# Patient Record
Sex: Female | Born: 1970 | Race: Black or African American | Hispanic: No | Marital: Married | Smoking: Former smoker
Health system: Southern US, Community
[De-identification: ages and names within clinical notes are randomized; demographics above are authoritative.]

## PROBLEM LIST (undated history)

## (undated) DIAGNOSIS — G510 Bell's palsy: Secondary | ICD-10-CM

## (undated) DIAGNOSIS — M792 Neuralgia and neuritis, unspecified: Secondary | ICD-10-CM

## (undated) DIAGNOSIS — F32A Depression, unspecified: Secondary | ICD-10-CM

## (undated) DIAGNOSIS — Z9289 Personal history of other medical treatment: Secondary | ICD-10-CM

## (undated) DIAGNOSIS — R609 Edema, unspecified: Secondary | ICD-10-CM

## (undated) DIAGNOSIS — Z8669 Personal history of other diseases of the nervous system and sense organs: Secondary | ICD-10-CM

## (undated) DIAGNOSIS — I82409 Acute embolism and thrombosis of unspecified deep veins of unspecified lower extremity: Secondary | ICD-10-CM

## (undated) DIAGNOSIS — F419 Anxiety disorder, unspecified: Secondary | ICD-10-CM

## (undated) DIAGNOSIS — D72819 Decreased white blood cell count, unspecified: Secondary | ICD-10-CM

## (undated) DIAGNOSIS — D649 Anemia, unspecified: Secondary | ICD-10-CM

## (undated) DIAGNOSIS — F329 Major depressive disorder, single episode, unspecified: Secondary | ICD-10-CM

## (undated) DIAGNOSIS — Z95828 Presence of other vascular implants and grafts: Principal | ICD-10-CM

## (undated) DIAGNOSIS — K219 Gastro-esophageal reflux disease without esophagitis: Secondary | ICD-10-CM

## (undated) DIAGNOSIS — I4510 Unspecified right bundle-branch block: Secondary | ICD-10-CM

## (undated) DIAGNOSIS — M797 Fibromyalgia: Secondary | ICD-10-CM

## (undated) DIAGNOSIS — G35 Multiple sclerosis: Secondary | ICD-10-CM

## (undated) DIAGNOSIS — R600 Localized edema: Secondary | ICD-10-CM

## (undated) DIAGNOSIS — I10 Essential (primary) hypertension: Secondary | ICD-10-CM

## (undated) HISTORY — DX: Multiple sclerosis: G35

## (undated) HISTORY — DX: Essential (primary) hypertension: I10

## (undated) HISTORY — DX: Edema, unspecified: R60.9

## (undated) HISTORY — DX: Localized edema: R60.0

## (undated) HISTORY — DX: Anemia, unspecified: D64.9

## (undated) HISTORY — PX: PORTACATH PLACEMENT: SHX2246

## (undated) HISTORY — PX: CHOLECYSTECTOMY: SHX55

## (undated) HISTORY — DX: Decreased white blood cell count, unspecified: D72.819

## (undated) HISTORY — DX: Acute embolism and thrombosis of unspecified deep veins of unspecified lower extremity: I82.409

## (undated) HISTORY — DX: Presence of other vascular implants and grafts: Z95.828

## (undated) HISTORY — DX: Major depressive disorder, single episode, unspecified: F32.9

## (undated) HISTORY — DX: Anxiety disorder, unspecified: F41.9

## (undated) HISTORY — DX: Depression, unspecified: F32.A

## (undated) HISTORY — DX: Neuralgia and neuritis, unspecified: M79.2

## (undated) HISTORY — PX: ABDOMINAL HYSTERECTOMY: SHX81

---

## 1995-06-12 DIAGNOSIS — G35 Multiple sclerosis: Secondary | ICD-10-CM

## 1995-06-12 HISTORY — DX: Multiple sclerosis: G35

## 1998-05-20 ENCOUNTER — Ambulatory Visit (HOSPITAL_COMMUNITY): Admission: RE | Admit: 1998-05-20 | Discharge: 1998-05-20 | Payer: Self-pay | Admitting: Psychiatry

## 1998-06-11 DIAGNOSIS — Z9289 Personal history of other medical treatment: Secondary | ICD-10-CM

## 1998-06-11 HISTORY — DX: Personal history of other medical treatment: Z92.89

## 1998-09-29 ENCOUNTER — Other Ambulatory Visit: Admission: RE | Admit: 1998-09-29 | Discharge: 1998-09-29 | Payer: Self-pay | Admitting: Family Medicine

## 1999-06-12 HISTORY — PX: TUBAL LIGATION: SHX77

## 1999-11-22 ENCOUNTER — Ambulatory Visit (HOSPITAL_COMMUNITY): Admission: RE | Admit: 1999-11-22 | Discharge: 1999-11-22 | Payer: Self-pay | Admitting: Psychiatry

## 2000-01-02 ENCOUNTER — Emergency Department (HOSPITAL_COMMUNITY): Admission: EM | Admit: 2000-01-02 | Discharge: 2000-01-02 | Payer: Self-pay | Admitting: *Deleted

## 2000-01-02 ENCOUNTER — Encounter: Payer: Self-pay | Admitting: *Deleted

## 2000-01-03 ENCOUNTER — Emergency Department (HOSPITAL_COMMUNITY): Admission: EM | Admit: 2000-01-03 | Discharge: 2000-01-03 | Payer: Self-pay | Admitting: *Deleted

## 2000-06-18 ENCOUNTER — Ambulatory Visit (HOSPITAL_COMMUNITY): Admission: RE | Admit: 2000-06-18 | Discharge: 2000-06-18 | Payer: Self-pay | Admitting: Psychiatry

## 2000-06-18 ENCOUNTER — Encounter: Payer: Self-pay | Admitting: Psychiatry

## 2000-10-05 ENCOUNTER — Emergency Department (HOSPITAL_COMMUNITY): Admission: EM | Admit: 2000-10-05 | Discharge: 2000-10-05 | Payer: Self-pay | Admitting: Emergency Medicine

## 2000-11-23 ENCOUNTER — Emergency Department (HOSPITAL_COMMUNITY): Admission: EM | Admit: 2000-11-23 | Discharge: 2000-11-23 | Payer: Self-pay | Admitting: *Deleted

## 2000-11-27 ENCOUNTER — Ambulatory Visit (HOSPITAL_COMMUNITY): Admission: RE | Admit: 2000-11-27 | Discharge: 2000-11-27 | Payer: Self-pay | Admitting: Internal Medicine

## 2000-11-27 ENCOUNTER — Encounter: Payer: Self-pay | Admitting: Internal Medicine

## 2002-07-20 ENCOUNTER — Encounter (HOSPITAL_COMMUNITY): Admission: RE | Admit: 2002-07-20 | Discharge: 2002-10-18 | Payer: Self-pay | Admitting: Neurology

## 2002-09-16 ENCOUNTER — Encounter (HOSPITAL_COMMUNITY): Admission: RE | Admit: 2002-09-16 | Discharge: 2002-09-19 | Payer: Self-pay | Admitting: Neurology

## 2002-12-12 ENCOUNTER — Emergency Department (HOSPITAL_COMMUNITY): Admission: EM | Admit: 2002-12-12 | Discharge: 2002-12-12 | Payer: Self-pay | Admitting: Emergency Medicine

## 2002-12-12 ENCOUNTER — Encounter: Payer: Self-pay | Admitting: Emergency Medicine

## 2002-12-13 ENCOUNTER — Emergency Department (HOSPITAL_COMMUNITY): Admission: EM | Admit: 2002-12-13 | Discharge: 2002-12-13 | Payer: Self-pay | Admitting: Internal Medicine

## 2002-12-18 ENCOUNTER — Encounter (HOSPITAL_COMMUNITY): Admission: RE | Admit: 2002-12-18 | Discharge: 2003-01-17 | Payer: Self-pay | Admitting: Internal Medicine

## 2003-01-26 ENCOUNTER — Ambulatory Visit (HOSPITAL_COMMUNITY): Admission: RE | Admit: 2003-01-26 | Discharge: 2003-01-26 | Payer: Self-pay | Admitting: Family Medicine

## 2003-01-26 ENCOUNTER — Encounter: Payer: Self-pay | Admitting: Family Medicine

## 2003-08-12 ENCOUNTER — Other Ambulatory Visit: Admission: RE | Admit: 2003-08-12 | Discharge: 2003-08-12 | Payer: Self-pay | Admitting: *Deleted

## 2003-08-20 ENCOUNTER — Inpatient Hospital Stay (HOSPITAL_COMMUNITY): Admission: AD | Admit: 2003-08-20 | Discharge: 2003-08-20 | Payer: Self-pay | Admitting: *Deleted

## 2003-09-09 ENCOUNTER — Ambulatory Visit (HOSPITAL_COMMUNITY): Admission: RE | Admit: 2003-09-09 | Discharge: 2003-09-09 | Payer: Self-pay | Admitting: *Deleted

## 2003-09-09 ENCOUNTER — Encounter (INDEPENDENT_AMBULATORY_CARE_PROVIDER_SITE_OTHER): Payer: Self-pay | Admitting: *Deleted

## 2003-11-24 ENCOUNTER — Observation Stay (HOSPITAL_COMMUNITY): Admission: RE | Admit: 2003-11-24 | Discharge: 2003-11-25 | Payer: Self-pay | Admitting: *Deleted

## 2003-11-24 ENCOUNTER — Encounter (INDEPENDENT_AMBULATORY_CARE_PROVIDER_SITE_OTHER): Payer: Self-pay | Admitting: Specialist

## 2004-02-05 ENCOUNTER — Encounter (HOSPITAL_COMMUNITY): Admission: RE | Admit: 2004-02-05 | Discharge: 2004-03-06 | Payer: Self-pay | Admitting: Family Medicine

## 2004-03-27 ENCOUNTER — Encounter (HOSPITAL_COMMUNITY): Admission: RE | Admit: 2004-03-27 | Discharge: 2004-04-26 | Payer: Self-pay | Admitting: Family Medicine

## 2004-04-22 ENCOUNTER — Ambulatory Visit (HOSPITAL_COMMUNITY): Payer: Self-pay | Admitting: Family Medicine

## 2004-05-22 ENCOUNTER — Ambulatory Visit: Payer: Self-pay | Admitting: Family Medicine

## 2004-06-22 ENCOUNTER — Ambulatory Visit (HOSPITAL_COMMUNITY): Payer: Self-pay | Admitting: Family Medicine

## 2004-06-22 ENCOUNTER — Encounter (HOSPITAL_COMMUNITY): Admission: RE | Admit: 2004-06-22 | Discharge: 2004-07-22 | Payer: Self-pay | Admitting: Family Medicine

## 2004-08-04 ENCOUNTER — Encounter (HOSPITAL_COMMUNITY): Admission: RE | Admit: 2004-08-04 | Discharge: 2004-09-03 | Payer: Self-pay | Admitting: Family Medicine

## 2004-08-07 ENCOUNTER — Ambulatory Visit: Payer: Self-pay | Admitting: Family Medicine

## 2004-09-19 ENCOUNTER — Encounter (HOSPITAL_COMMUNITY): Admission: RE | Admit: 2004-09-19 | Discharge: 2004-10-19 | Payer: Self-pay | Admitting: Family Medicine

## 2004-09-19 ENCOUNTER — Ambulatory Visit (HOSPITAL_COMMUNITY): Payer: Self-pay | Admitting: Family Medicine

## 2004-09-19 ENCOUNTER — Ambulatory Visit: Payer: Self-pay | Admitting: Family Medicine

## 2004-10-03 ENCOUNTER — Inpatient Hospital Stay (HOSPITAL_COMMUNITY): Admission: AD | Admit: 2004-10-03 | Discharge: 2004-10-04 | Payer: Self-pay | Admitting: Internal Medicine

## 2004-10-03 ENCOUNTER — Ambulatory Visit: Payer: Self-pay | Admitting: Family Medicine

## 2004-10-11 ENCOUNTER — Ambulatory Visit: Payer: Self-pay | Admitting: Family Medicine

## 2004-11-22 ENCOUNTER — Ambulatory Visit (HOSPITAL_COMMUNITY): Payer: Self-pay | Admitting: Family Medicine

## 2004-11-22 ENCOUNTER — Encounter (HOSPITAL_COMMUNITY): Admission: RE | Admit: 2004-11-22 | Discharge: 2004-12-22 | Payer: Self-pay | Admitting: Family Medicine

## 2005-01-12 ENCOUNTER — Encounter (HOSPITAL_COMMUNITY): Admission: RE | Admit: 2005-01-12 | Discharge: 2005-02-08 | Payer: Self-pay | Admitting: Family Medicine

## 2005-01-13 ENCOUNTER — Ambulatory Visit (HOSPITAL_COMMUNITY): Payer: Self-pay | Admitting: Family Medicine

## 2005-03-01 ENCOUNTER — Encounter (HOSPITAL_COMMUNITY): Admission: RE | Admit: 2005-03-01 | Discharge: 2005-03-10 | Payer: Self-pay | Admitting: Family Medicine

## 2005-03-01 ENCOUNTER — Ambulatory Visit (HOSPITAL_COMMUNITY): Payer: Self-pay | Admitting: Family Medicine

## 2005-04-12 ENCOUNTER — Encounter (HOSPITAL_COMMUNITY): Admission: RE | Admit: 2005-04-12 | Discharge: 2005-05-12 | Payer: Self-pay | Admitting: Oncology

## 2005-04-17 ENCOUNTER — Ambulatory Visit: Payer: Self-pay | Admitting: Family Medicine

## 2005-05-14 ENCOUNTER — Encounter (HOSPITAL_COMMUNITY): Admission: RE | Admit: 2005-05-14 | Discharge: 2005-05-14 | Payer: Self-pay | Admitting: Oncology

## 2005-05-14 ENCOUNTER — Ambulatory Visit (HOSPITAL_COMMUNITY): Payer: Self-pay | Admitting: Family Medicine

## 2005-06-29 ENCOUNTER — Encounter (HOSPITAL_COMMUNITY): Admission: RE | Admit: 2005-06-29 | Discharge: 2005-07-29 | Payer: Self-pay | Admitting: Family Medicine

## 2005-07-04 ENCOUNTER — Ambulatory Visit (HOSPITAL_COMMUNITY): Payer: Self-pay | Admitting: Family Medicine

## 2005-07-31 ENCOUNTER — Encounter (HOSPITAL_COMMUNITY): Admission: RE | Admit: 2005-07-31 | Discharge: 2005-08-30 | Payer: Self-pay | Admitting: Oncology

## 2005-10-04 ENCOUNTER — Encounter (HOSPITAL_COMMUNITY): Admission: RE | Admit: 2005-10-04 | Discharge: 2005-11-03 | Payer: Self-pay | Admitting: Family Medicine

## 2005-10-04 ENCOUNTER — Ambulatory Visit (HOSPITAL_COMMUNITY): Payer: Self-pay | Admitting: Family Medicine

## 2005-11-14 ENCOUNTER — Encounter (HOSPITAL_COMMUNITY): Admission: RE | Admit: 2005-11-14 | Discharge: 2005-12-14 | Payer: Self-pay | Admitting: Family Medicine

## 2005-12-17 ENCOUNTER — Ambulatory Visit: Payer: Self-pay | Admitting: Family Medicine

## 2006-01-11 ENCOUNTER — Encounter (HOSPITAL_COMMUNITY): Admission: RE | Admit: 2006-01-11 | Discharge: 2006-02-10 | Payer: Self-pay | Admitting: Oncology

## 2006-01-11 ENCOUNTER — Ambulatory Visit (HOSPITAL_COMMUNITY): Payer: Self-pay | Admitting: Family Medicine

## 2006-02-22 ENCOUNTER — Encounter (HOSPITAL_COMMUNITY): Admission: RE | Admit: 2006-02-22 | Discharge: 2006-03-08 | Payer: Self-pay | Admitting: Family Medicine

## 2006-04-08 ENCOUNTER — Encounter (HOSPITAL_COMMUNITY): Admission: RE | Admit: 2006-04-08 | Discharge: 2006-05-08 | Payer: Self-pay | Admitting: Family Medicine

## 2006-04-08 ENCOUNTER — Ambulatory Visit (HOSPITAL_COMMUNITY): Payer: Self-pay | Admitting: Family Medicine

## 2006-07-11 ENCOUNTER — Encounter (HOSPITAL_COMMUNITY): Admission: RE | Admit: 2006-07-11 | Discharge: 2006-08-10 | Payer: Self-pay | Admitting: Family Medicine

## 2006-07-11 ENCOUNTER — Ambulatory Visit (HOSPITAL_COMMUNITY): Payer: Self-pay | Admitting: Family Medicine

## 2006-08-22 ENCOUNTER — Encounter (HOSPITAL_COMMUNITY): Admission: RE | Admit: 2006-08-22 | Discharge: 2006-09-21 | Payer: Self-pay | Admitting: Family Medicine

## 2006-10-03 ENCOUNTER — Encounter (HOSPITAL_COMMUNITY): Admission: RE | Admit: 2006-10-03 | Discharge: 2006-11-02 | Payer: Self-pay | Admitting: Family Medicine

## 2006-10-03 ENCOUNTER — Ambulatory Visit (HOSPITAL_COMMUNITY): Payer: Self-pay | Admitting: Family Medicine

## 2006-12-09 ENCOUNTER — Ambulatory Visit (HOSPITAL_COMMUNITY): Payer: Self-pay | Admitting: Family Medicine

## 2006-12-09 ENCOUNTER — Encounter (HOSPITAL_COMMUNITY): Admission: RE | Admit: 2006-12-09 | Discharge: 2007-01-08 | Payer: Self-pay | Admitting: Oncology

## 2007-02-18 ENCOUNTER — Ambulatory Visit (HOSPITAL_COMMUNITY): Payer: Self-pay | Admitting: Oncology

## 2007-02-18 ENCOUNTER — Encounter (HOSPITAL_COMMUNITY): Admission: RE | Admit: 2007-02-18 | Discharge: 2007-03-11 | Payer: Self-pay | Admitting: Family Medicine

## 2007-04-02 ENCOUNTER — Ambulatory Visit (HOSPITAL_COMMUNITY): Payer: Self-pay | Admitting: Family Medicine

## 2007-04-02 ENCOUNTER — Encounter (HOSPITAL_COMMUNITY): Admission: RE | Admit: 2007-04-02 | Discharge: 2007-05-02 | Payer: Self-pay | Admitting: Family Medicine

## 2007-05-14 ENCOUNTER — Ambulatory Visit (HOSPITAL_COMMUNITY): Payer: Self-pay | Admitting: Oncology

## 2007-05-14 ENCOUNTER — Encounter (HOSPITAL_COMMUNITY): Admission: RE | Admit: 2007-05-14 | Discharge: 2007-06-11 | Payer: Self-pay | Admitting: Pediatrics

## 2007-05-21 ENCOUNTER — Ambulatory Visit (HOSPITAL_COMMUNITY): Admission: RE | Admit: 2007-05-21 | Discharge: 2007-05-21 | Payer: Self-pay | Admitting: Family Medicine

## 2007-06-12 ENCOUNTER — Encounter: Payer: Self-pay | Admitting: Family Medicine

## 2007-06-15 ENCOUNTER — Emergency Department (HOSPITAL_COMMUNITY): Admission: EM | Admit: 2007-06-15 | Discharge: 2007-06-15 | Payer: Self-pay | Admitting: Emergency Medicine

## 2007-06-16 ENCOUNTER — Ambulatory Visit (HOSPITAL_COMMUNITY): Admission: RE | Admit: 2007-06-16 | Discharge: 2007-06-16 | Payer: Self-pay | Admitting: Family Medicine

## 2007-06-19 ENCOUNTER — Encounter (HOSPITAL_COMMUNITY): Admission: RE | Admit: 2007-06-19 | Discharge: 2007-07-19 | Payer: Self-pay | Admitting: Family Medicine

## 2007-06-25 ENCOUNTER — Observation Stay (HOSPITAL_COMMUNITY): Admission: RE | Admit: 2007-06-25 | Discharge: 2007-06-26 | Payer: Self-pay | Admitting: General Surgery

## 2007-06-25 ENCOUNTER — Encounter (INDEPENDENT_AMBULATORY_CARE_PROVIDER_SITE_OTHER): Payer: Self-pay | Admitting: General Surgery

## 2007-07-24 ENCOUNTER — Encounter (HOSPITAL_COMMUNITY): Admission: RE | Admit: 2007-07-24 | Discharge: 2007-08-23 | Payer: Self-pay | Admitting: Family Medicine

## 2007-07-24 ENCOUNTER — Ambulatory Visit (HOSPITAL_COMMUNITY): Payer: Self-pay | Admitting: Family Medicine

## 2007-09-09 ENCOUNTER — Encounter (HOSPITAL_COMMUNITY): Admission: RE | Admit: 2007-09-09 | Discharge: 2007-10-09 | Payer: Self-pay | Admitting: Oncology

## 2007-10-21 ENCOUNTER — Encounter (HOSPITAL_COMMUNITY): Admission: RE | Admit: 2007-10-21 | Discharge: 2007-11-20 | Payer: Self-pay | Admitting: Family Medicine

## 2007-10-21 ENCOUNTER — Ambulatory Visit (HOSPITAL_COMMUNITY): Payer: Self-pay | Admitting: Family Medicine

## 2007-12-05 ENCOUNTER — Encounter (HOSPITAL_COMMUNITY): Admission: RE | Admit: 2007-12-05 | Discharge: 2008-01-04 | Payer: Self-pay | Admitting: Family Medicine

## 2008-01-22 ENCOUNTER — Ambulatory Visit (HOSPITAL_COMMUNITY): Payer: Self-pay | Admitting: Family Medicine

## 2008-01-22 ENCOUNTER — Encounter (HOSPITAL_COMMUNITY): Admission: RE | Admit: 2008-01-22 | Discharge: 2008-02-21 | Payer: Self-pay | Admitting: Family Medicine

## 2008-03-11 ENCOUNTER — Ambulatory Visit (HOSPITAL_COMMUNITY): Payer: Self-pay | Admitting: Family Medicine

## 2008-03-11 ENCOUNTER — Encounter (HOSPITAL_COMMUNITY): Admission: RE | Admit: 2008-03-11 | Discharge: 2008-04-10 | Payer: Self-pay | Admitting: Family Medicine

## 2008-05-26 ENCOUNTER — Encounter (HOSPITAL_COMMUNITY): Admission: RE | Admit: 2008-05-26 | Discharge: 2008-06-25 | Payer: Self-pay | Admitting: Family Medicine

## 2008-05-26 ENCOUNTER — Ambulatory Visit (HOSPITAL_COMMUNITY): Payer: Self-pay | Admitting: Family Medicine

## 2009-06-11 HISTORY — PX: ESOPHAGOGASTRODUODENOSCOPY: SHX1529

## 2010-04-03 ENCOUNTER — Emergency Department (HOSPITAL_COMMUNITY): Admission: EM | Admit: 2010-04-03 | Discharge: 2010-04-03 | Payer: Self-pay | Admitting: Emergency Medicine

## 2010-04-12 ENCOUNTER — Ambulatory Visit: Payer: Self-pay | Admitting: Internal Medicine

## 2010-04-12 ENCOUNTER — Encounter: Payer: Self-pay | Admitting: Gastroenterology

## 2010-04-12 DIAGNOSIS — D649 Anemia, unspecified: Secondary | ICD-10-CM | POA: Insufficient documentation

## 2010-04-12 DIAGNOSIS — R112 Nausea with vomiting, unspecified: Secondary | ICD-10-CM | POA: Insufficient documentation

## 2010-04-12 DIAGNOSIS — R1319 Other dysphagia: Secondary | ICD-10-CM

## 2010-04-17 ENCOUNTER — Ambulatory Visit (HOSPITAL_COMMUNITY): Admission: RE | Admit: 2010-04-17 | Discharge: 2010-04-17 | Payer: Self-pay | Admitting: Gastroenterology

## 2010-04-19 ENCOUNTER — Encounter (INDEPENDENT_AMBULATORY_CARE_PROVIDER_SITE_OTHER): Payer: Self-pay

## 2010-04-23 ENCOUNTER — Encounter: Payer: Self-pay | Admitting: Internal Medicine

## 2010-06-01 ENCOUNTER — Encounter: Payer: Self-pay | Admitting: Internal Medicine

## 2010-07-12 ENCOUNTER — Encounter (INDEPENDENT_AMBULATORY_CARE_PROVIDER_SITE_OTHER): Payer: Self-pay | Admitting: *Deleted

## 2010-07-13 NOTE — Assessment & Plan Note (Signed)
Summary: ER REFERRAL NAUSEA AN VOMITING/ss   Visit Type:  Initial Consult Referring Provider:  ER Primary Care Provider:  Lilyan Punt  Chief Complaint:  n/v.  History of Present Illness: [name] is a very pleasant 40 y/o AA female, who was referred by [facility] for further evaluation of N/V.   Recent acute onset N/V last Friday. N/V all weekend. Saw Dr. Lilyan Punt on Monday but dehydrated so sent to ED. She has also had chronic component of nausea actually for about several months. Certain foods cause vomiting. Last time was one month ago. No vomiting since Monday. Heartburn symptoms as well. Regurgitation. Some dysphagia, breads and meats. Rare abd pain. Some gas pain, goes away with gas-x. BM better with increase water consumption. No melena. Occasion brbpr on toilet tissue with hard stools. BM once every two days.   AAS recently showed mildly prominent air in stomach/colon. Labs in ED 04/03/10: WBC 3600, H/H 11.3/34, MCV 84.3, Plt 302, lipase 26, cre 0.75. U/A showed many bacteria, wbc 3-6, small LE, 30 protein, 15 ketone.   Current Medications (verified): 1)  Sucralfate 1 Gm Tabs (Sucralfate) .... Take 1 Tablet By Mouth Four Times A Day 2)  Gilenya 0.5 Mg Caps (Fingolimod Hcl) .... Take 1 Tablet By Mouth Once A Day 3)  Ritalin 10 Mg Tabs (Methylphenidate Hcl) .... Take 1 Tablet By Mouth Two Times A Day 4)  Verelan 120 Mg Xr24h-Cap (Verapamil Hcl) .... One Tablet in The Am and Two Tablets in The Pm 5)  Protonix 40 Mg Tbec (Pantoprazole Sodium) .... Take 1 Tablet By Mouth Once A Day 6)  Zofran 8 Mg Tabs (Ondansetron Hcl) .... As Needed 7)  Effexor Xr 150 Mg Xr24h-Cap (Venlafaxine Hcl) .... Take 1 Tablet By Mouth Once A Day 8)  Multi-Vitamin .... Take 1 Tablet By Mouth Once A Day 9)  Aleve .... As Needed 10)  Advil .... As Needed  Allergies (verified): No Known Drug Allergies  Past History:  Past Medical History: Multiple Sclerosis, Diagnosis 1997 Migraine H/As No prior  TCS/EGD  Past Surgical History: Cholecystectomy Hysterectomy Tubal Ligation Porta Cath, given for MS treatment reasons  Family History: Paternal uncle, ?colon cancer, greater than age 82 Brother, alcohol cirrhosis.  Social History: Married. Two girls, teenagers. Disabled in 2001 related to MS. Nonsmoker, alcohol, drugs.   Review of Systems General:  Denies fever, chills, sweats, anorexia, fatigue, weakness, and weight loss. Eyes:  Denies vision loss. ENT:  Complains of difficulty swallowing; denies nasal congestion, sore throat, and hoarseness. CV:  Denies chest pains, angina, palpitations, dyspnea on exertion, and peripheral edema. Resp:  Denies dyspnea at rest, dyspnea with exercise, cough, sputum, and wheezing. GI:  See HPI. GU:  Denies urinary burning and blood in urine. MS:  Denies joint pain / LOM. Derm:  Denies rash and itching. Neuro:  Complains of frequent headaches; denies weakness, memory loss, and confusion. Psych:  Denies depression and anxiety. Endo:  Denies unusual weight change. Heme:  Denies bruising and bleeding. Allergy:  Denies hives and rash.  Vital Signs:  Patient profile:   40 year old female Height:      70 inches Weight:      291 pounds BMI:     41.91 Temp:     98.5 degrees F oral Pulse rate:   80 / minute BP sitting:   110 / 70  (left arm) Cuff size:   large  Vitals Entered By: Cloria Spring LPN (April 12, 2010 1:39 PM)  Physical Exam  General:  Well developed, well nourished, no acute distress.obese.   Head:  Normocephalic and atraumatic. Eyes:  sclera nonicteric Mouth:  Oropharyngeal mucosa moist, pink.  No lesions, erythema or exudate.    Neck:  Supple; no masses or thyromegaly. Lungs:  Clear throughout to auscultation. Heart:  Regular rate and rhythm; no murmurs, rubs,  or bruits. Abdomen:  Obese. Soft. NT. ND. No HSM or masses. No abd bruit or hernia.  Extremities:  No clubbing, cyanosis, edema or deformities noted. Neurologic:   Alert and  oriented x4;  grossly normal neurologically. Skin:  Intact without significant lesions or rashes. Cervical Nodes:  No significant cervical adenopathy. Psych:  Alert and cooperative. Normal mood and affect.  Impression & Recommendations:  Problem # 1:  NAUSEA WITH VOMITING (ICD-787.01) N/V acute on chronic requiring recent ED visit for dehydration. Usually brought on by fatty or greasy meal. GB removed few years ago. Also with c/o dysphagia to solid foods. Heartburn/regurgitation as well. Protonix and carafate recently started and feels like some improvment. EGD/ED to be performed in near future.  Risks, alternatives, benefits including but not limited to risk of reaction to medications, bleeding, infection, and perforation addressed.  Patient voiced understanding and verbal consent obtained.   Problem # 2:  ANEMIA (ICD-285.9) Mild leukopenia/anemia. Possibly secondary to Gilenya. Rare brbpr on toilet tissue with hard stool. Will check iron studies and ifobt.  I would like to thank [facility] ED/Dr. Annye Rusk for allowing [place] to take part in the care of this nice patient.   Appended Document: ER REFERRAL NAUSEA AN VOMITING/ss Please ask patient to have labs and ifobt done. She had mild anemia which could be secondary to Gilenya but we need to r/o IDA with occult gi bleeding.   Appended Document: Orders Update    Clinical Lists Changes  Orders: Added new Test order of T-Iron [phone]) - Signed Added new Test order of T-Iron Binding Capacity (TIBC) ([id number]) - Signed Added new Test order of T-Ferritin ([id number]) - Signed Added new Test order of T-Hemoglobin and Hematocrit (1005) - Signed      Appended Document: ER REFERRAL NAUSEA AN VOMITING/ss Pt informed. She will come by here in the AM for the iFOBT container and lab order.  Appended Document: Orders Update    Clinical Lists Changes  Orders: Added new Service order of Consultation Level III [phone]) -  Signed      Appended Document: ER REFERRAL NAUSEA AN VOMITING/ss Please f/u with patient about labs and ifobt.  Appended Document: ER REFERRAL NAUSEA AN VOMITING/ss mailed letter to pt

## 2010-07-13 NOTE — Letter (Signed)
Summary: PT REMINDER TO COMPLETE IFOBT  PT REMINDER TO COMPLETE IFOBT   Imported By: Rexene Alberts 06/01/2010 15:19:13  _____________________________________________________________________  External Attachment:    Type:   Image     Comment:   External Document

## 2010-07-13 NOTE — Letter (Signed)
Summary: EGD/ED ORDER  EGD/ED ORDER   Imported By: Ave Filter 04/12/2010 15:05:07  _____________________________________________________________________  External Attachment:    Type:   Image     Comment:   External Document

## 2010-07-13 NOTE — Letter (Signed)
Summary: [facility]  [address]   [place], [place] [address]   Phone: [phone]  Fax: [phone]     April 19, 2010   KAIDA GAMES [address] [place], [place]  [address] 11/22/1970  Dear Ms. Garramone,  During your last appointment, your doctor requested you have some blood work and a stool sample.  Our records indicate you have not had this done.  Remember it is very important to follow your doctor's instructions.   Please have this blood work done as soon as possible and bring the stool sample container back to the office.  It is important that patients and their doctor work together in the management/treatment of their health care.  If you have lost or misplaced your lab orders, please give [place] a call and we will gladly give you a new copy.  If you have already had your blood work drawn, please disregard this letter.  Thank you,    Hendricks Limes LPN  [facility] Ph: [phone]   Fax: [phone]

## 2010-07-13 NOTE — Letter (Signed)
Summary: rpc chart  rpc chart   Imported By: Curtis Sites 01/03/2010 12:12:43  _____________________________________________________________________  External Attachment:    Type:   Image     Comment:   External Document

## 2010-07-13 NOTE — Letter (Signed)
Summary: Patient Notice, Endo Biopsy Results  [facility]  [address]   [place], [place] [address]   Phone: [phone]  Fax: [phone]       April 23, 2010   Melinda Moore [address] [place], [place]  [address] [birth year]    Dear Ms. Douthit,  I am pleased to inform you that the biopsies taken during your recent endoscopic examination did not show any evidence of cancer upon pathologic examination.  There was only mild inflammation.  Additional information/recommendations:  Please call [phone] to schedule a return visit in 3 months to review your condition.  Continue with the treatment plan as outlined on the day of your exam.   Please call us if you are having persistent problems or have questions about your condition that have not been fully answered at this time.  Sincerely,    R. Roetta Sessions MD, FACP [facility] Ph: [phone]   Fax: [phone]   Appended Document: Patient Notice, Endo Biopsy Results letter mailed to pt  Appended Document: Patient Notice, Endo Biopsy Results reminder in computer

## 2010-07-19 NOTE — Letter (Signed)
Summary: Recall Office Visit  [facility]  [address]   [place], [place] [address]   Phone: [phone]  Fax: [phone]      July 12, 2010   Melinda Moore [address] [place], [place]  [address] 22-Dec-1970   Dear [name],   According to our records, it is time for you to schedule a follow-up office visit with [place].   At your convenience, please call [phone] to schedule an office visit. If you have any questions, concerns, or feel that this letter is in error, we would appreciate your call.   Sincerely,    Diana Eves  [facility] Ph: [phone]   Fax: [phone]

## 2010-08-23 LAB — CBC
HCT: 34 % — ABNORMAL LOW (ref 36.0–46.0)
Hemoglobin: 11.3 g/dL — ABNORMAL LOW (ref 12.0–15.0)
MCH: 28.1 pg (ref 26.0–34.0)
Platelets: 302 10*3/uL (ref 150–400)
RDW: 15.6 % — ABNORMAL HIGH (ref 11.5–15.5)
WBC: 3.6 10*3/uL — ABNORMAL LOW (ref 4.0–10.5)

## 2010-08-23 LAB — URINE MICROSCOPIC-ADD ON

## 2010-08-23 LAB — DIFFERENTIAL
Basophils Relative: 0 % (ref 0–1)
Eosinophils Absolute: 0 10*3/uL (ref 0.0–0.7)
Lymphocytes Relative: 5 % — ABNORMAL LOW (ref 12–46)
Neutro Abs: 3.1 10*3/uL (ref 1.7–7.7)

## 2010-08-23 LAB — URINALYSIS, ROUTINE W REFLEX MICROSCOPIC
Ketones, ur: 15 mg/dL — AB
Protein, ur: 30 mg/dL — AB
Specific Gravity, Urine: 1.02 (ref 1.005–1.030)

## 2010-08-23 LAB — BASIC METABOLIC PANEL
BUN: 5 mg/dL — ABNORMAL LOW (ref 6–23)
GFR calc Af Amer: >60 mL/min (ref >60)
Potassium: 3.7 mEq/L (ref 3.5–5.1)
Sodium: 138 mEq/L (ref 135–145)

## 2010-08-23 LAB — CARDIAC PANEL(CRET KIN+CKTOT+MB+TROPI): Total CK: 74 U/L (ref 7–177)

## 2010-10-24 NOTE — Op Note (Signed)
[name], Melinda Moore                    ACCOUNT NO.:  [id number]   MEDICAL RECORD NO.:  [id number]          PATIENT TYPE:  OBV   LOCATION:  A309                          FACILITY:  [facility]   PHYSICIAN:  Dalia Heading, M.D.  DATE OF BIRTH:  [birth year]   DATE OF PROCEDURE:  06/25/2007  DATE OF DISCHARGE:                               OPERATIVE REPORT   PREOPERATIVE DIAGNOSIS:  Chronic cholecystitis.   POSTOPERATIVE DIAGNOSIS:  Chronic cholecystitis.   PROCEDURE:  Laparoscopic cholecystectomy.   SURGEON:  Franky Macho, MD   ANESTHESIA:  General endotracheal.   INDICATIONS:  The patient is a 40 year old black female who presents  with biliary colic secondary to chronic cholecystitis.  The risks and  benefits of the procedure including bleeding, infection, hepatobiliary  injury and the possibility of an open procedure were fully explained to  the patient, who gave informed consent.   PROCEDURE NOTE:  The patient was placed in the supine position.  After  induction of general endotracheal anesthesia, the abdomen was prepped  and draped using the usual sterile technique with Betadine.  Surgical  site confirmation was performed.   A supraumbilical incision was made down to the fascia.  A Veress needle  was introduced into the abdominal cavity and confirmation of placement  was done using the saline drop test.  The abdomen was then insufflated  to 16 mmHg pressure.  Abn 11-mm trocar was introduced into the abdominal  cavity under direct visualization without difficulty.  The patient was  placed in reversed Trendelenburg position and an additional 11-mm trocar  was placed in the epigastric region and 5-mm trocars were placed in the  right upper quadrant and right flank regions.  The liver was inspected  and noted to be within normal limits.  The gallbladder was retracted  superior and laterally.  The dissection was begun around the  infundibulum of the gallbladder.  The cystic duct was first  identified.  Its juncture to the infundibulum was fully identified.  Endo clips were  placed proximally and distally on the cystic duct and the cystic duct  was divided.  This was likewise done to the cystic artery.  The  gallbladder was then freed away from the gallbladder fossa using Bovie  electrocautery.  The gallbladder was delivered through the epigastric  trocar site using an EndoCatch bag.  The gallbladder fossa was inspected  and no abnormal bleeding or bile leakage was noted.  Surgicel was placed  in the gallbladder fossa.  All fluid and air were then evacuated from  the abdominal cavity prior to removal of the trocars.   All wounds were irrigated with normal saline.  All wounds were injected  with 0.5% Sensorcaine.  The supraumbilical fascia was reapproximated  using an 0 Vicryl interrupted suture.  All skin incisions were closed  using staples.  Betadine ointment and dry sterile dressings were  applied.   All tape and needle counts were correct at the end of the procedure.  The patient was extubated in the operating room and went back to  recovery room,  awake and in stable condition.   COMPLICATIONS:  None.   SPECIMEN:  Gallbladder.   BLOOD LOSS:  Minimal.      Dalia Heading, M.D.  Electronically Signed     MAJ/MEDQ  D:  06/25/2007  T:  06/25/2007  Job:  [id number]   cc:   Lorin Picket A. Gerda Diss, MD  Fax: [phone]

## 2010-10-24 NOTE — H&P (Signed)
Melinda Moore, Melinda Moore                    ACCOUNT NO.:  [id number]   MEDICAL RECORD NO.:  [id number]           PATIENT TYPE:  AMB   LOCATION:  DAY                           FACILITY:  [facility]   PHYSICIAN:  Dalia Heading, M.D.  DATE OF BIRTH:  06-Dec-1970   DATE OF ADMISSION:  06/25/2007  DATE OF DISCHARGE:  LH                              HISTORY & PHYSICAL   CHIEF COMPLAINT:  Chronic cholecystitis.   HISTORY OF PRESENT ILLNESS:  The patient is a 40 year old black female  who was referred for evaluation and treatment of biliary colic secondary  to cholelithiasis.  She has been having intermittent right upper  quadrant abdominal pain with radiation to the flank, nausea and  indigestion for several  months.  It is made worse with fatty food.  No  fever, chills, or jaundice have been noted.   PAST MEDICAL HISTORY:  Includes multiple sclerosis, breast cancer.   PAST SURGICAL HISTORY:  Port-A-Cath insertion, hysterectomy, tubal  ligation.   CURRENT MEDICATIONS:  Betaseron injections, Phenergan, Amitiza.   ALLERGIES:  No known drug allergies.   REVIEW OF SYSTEMS:  The patient denies drinking or smoking.  She denies  any other cardiopulmonary difficulties or bleeding disorders.   PHYSICAL EXAMINATION:  The patient is a well-developed, well-nourished  black female in no acute distress.  HEENT:  Reveals no scleral icterus.  LUNGS:  Clear to auscultation with equal breath sounds bilaterally.  HEART:  Reveals a regular rate and rhythm without S3, S4 or murmurs.  ABDOMEN:  Soft, nontender, nondistended.  No hepatosplenomegaly, masses  or hernias are identified.   HIDA scan reveals chronic cholecystitis with a low gallbladder ejection  fraction.   IMPRESSION:  Chronic cholecystitis.   PLAN:  The patient is scheduled for a laparoscopic cholecystectomy on  June 25, 2007.  The risks and benefits of the procedure including  bleeding, infection, hepatobiliary injury, the possibility of an open  procedure were fully explained to the patient, who gave informed  consent.      Dalia Heading, M.D.  Electronically Signed     MAJ/MEDQ  D:  06/24/2007  T:  06/24/2007  Job:  [id number]   cc:   Lorin Picket A. Gerda Diss, MD  Fax: [phone]

## 2010-10-27 NOTE — H&P (Signed)
Melinda Moore, IANNUZZI                    ACCOUNT NO.:  [id number]   MEDICAL RECORD NO.:  [id number]          PATIENT TYPE:  INP   LOCATION:  A332                          FACILITY:  [facility]   PHYSICIAN:  Calvert Cantor, M.D.     DATE OF BIRTH:  03/26/1971   DATE OF ADMISSION:  10/03/2004  DATE OF DISCHARGE:  LH                                HISTORY & PHYSICAL   PRESENTING COMPLAINT:  Weakness and pain in the right side of her face and  eye.   HISTORY OF PRESENT ILLNESS:  This is a 40 year old African American female  with a past medical history of relapsing, remitting multiple sclerosis.  Ms.  Melinda Moore complains of symptoms which started last Wednesday.  She began to feel  some numbness and tingling and burning in her lower extremities followed by  weakness.  In addition, she had pain in the right side of her face, right  ear and the back of her right eye and in her right forehead.  The patient  called her neurologist on Friday and was prescribed a three-day course of IV  Solu-Medrol at 1 g per day.  This course was completed yesterday.  This  morning the patient went to see Dr. Lodema Hong because she felt that her  symptoms had not improved sufficiently.  She continued to feel some numbness  and tingling in her extremities and that she is still complaining of pain in  the right side of her face, especially in her right eye.  Last night when  she was getting up from her bed, she felt dizzy and felt very unstable while  walking to the bathroom and needed to hold on to the walls to maintain her  gait.  She is complaining of nausea which started on Saturday which she  usually has when she is administered IV steroids.   ALLERGIES:  She is allergic to OXYCONTIN which causes hives.   CURRENT MEDICATIONS:  1.  Betaseron every other day at 0.3 mg.  2.  Cellcept which she states is four tablets in the afternoon, three      tablets in the morning; however, she is unsure of the exact mg.  3.  Phenergan she  takes 25 mg twice a day.  4.  Baclofen 10 mg at bedtime.   REVIEW OF SYMPTOMS:  Negative for abdominal pain, diarrhea, fever, sore  throat, cough, shortness of breath, dysuria or frequency of micturition.  She does complain of blurred vision in her right eye.   PAST MEDICAL HISTORY:  Multiple sclerosis diagnosed in 1997.   PAST SURGICAL HISTORY:  1.  Patient has had a partial hysterectomy for fibroids.  2.  She has had tubal ligation.  3.  Port-A-Cath placement.   SOCIAL HISTORY:  She is a nonsmoker, nonalcoholic.  Does not use any  unprescribed drugs.  She is married.  Lives at home with two children, two  girls who are alive and healthy.   FAMILY HISTORY:  Her father passed away with history of CHF.  Mother is  alive with history of  arthritis.  Her sister has hypertension and all of her  sisters have had hysterectomies.   PHYSICAL EXAMINATION:  GENERAL APPEARANCE:  Currently she is lying in bed,  does not appear to be in any physical distress.  VITAL SIGNS:  Temperature is 97 degrees, pulse 77, respiratory rate 20,  blood pressure is 113/74, pulse oximeter 100% on room air.  HEENT:  Atraumatic and normocephalic.  Pupils are equal, round, reactive to  light and accommodation.  Oral mucosa is moist. Her mouth deviates to the  right side, especially when she is smiling.  NECK:  Supple.  LUNGS:  Clear bilaterally.  CARDIOVASCULAR:  Regular rate and rhythm.  ABDOMEN:  Soft and nontender, nondistended.  Bowel sounds are positive.  EXTREMITIES:  No clubbing, cyanosis, or edema.  Pedal pulses are positive.   Blood work is pending.   ASSESSMENT/PLAN:  This is a 40 year old African American female with a  recent relapse in multiple sclerosis for which she has been on IV Solu-  Medrol for the past three days.  I have contacted her neurologist, Dr.  Thad Ranger, who recommends continuing IV Solu-Medrol for another two days and  then placing her on a prednisone taper.  He suggested this is  the course to  take even if her symptoms do not show complete improvement.   I am going to order blood work to rule out anemia, dehydration, hypokalemia.  I will also do a urinalysis to rule out urinary tract infection.  Orthostatic vitals will be checked.  The patient is currently able to  ambulate to the bathroom without any difficulty and therefore will only  received STD stockings while she is in bed.  Her medications will be  continued and symptoms will be monitored.      SR/MEDQ  D:  10/03/2004  T:  10/03/2004  Job:  [id number]

## 2010-10-27 NOTE — Discharge Summary (Signed)
Melinda Moore, Melinda Moore                    ACCOUNT NO.:  [id number]   MEDICAL RECORD NO.:  [id number]          PATIENT TYPE:  INP   LOCATION:  A332                          FACILITY:  [facility]   PHYSICIAN:  Calvert Cantor, M.D.     DATE OF BIRTH:  1971/05/30   DATE OF ADMISSION:  10/03/2004  DATE OF DISCHARGE:  04/26/2006LH                                 DISCHARGE SUMMARY   DISCHARGE DIAGNOSES:  1.  Exacerbation of multiple sclerosis.  2.  Iron-deficiency anemia.  3.  Hypokalemia.  4.  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  5.  Obesity.   DISCHARGE MEDICATIONS:  New medications for the patient will be:  1.  Iron sulfate 325 mg p.o. three times a day with meals.  2.  She is placed on a prednisone taper.  3.  Restoril 15 mg p.o. at a time if needed.   HOSPITAL COURSE:  This is a 40 year old African American female who was  admitted for relapsing multiple sclerosis and continued on IV Solu-Medrol  for two more days to complete a five-day course as she had already received  three days at home.  Blood work was drawn which revealed iron-deficiency  anemia.  Stool occult was negative.  The patient is therefore started on  iron therapy.  The patient required a sleeping pill.  She states that when  she is on steroids she feels slightly anxious and has trouble sleeping at  night.  The patient's symptoms improved slightly by the time she was ready  for discharge.  She stated that the pain in her face was gone.  She had a  very slight amount of pain behind her right eye.  The numbness and tingling  in her feet were gone as well.   DISCHARGE LABORATORY DATA:  White count 10.2, hemoglobin 9.9, hematocrit  30.1, MCV 76.6, platelets 367.  Sodium 134, potassium 3.6 coming up from 2.9  on admission, chloride 104, bicarb 27, glucose 140, BUN 8, creatinine 0.7,  calcium 8.1.  Iron level was 55, iron binding was 358, percent saturation  was 15, ferritin was 10.  TSH was low at 0.171.   UA was negative for  infection.  Fecal occult blood was negative.  Since the TSH is low at a time  when the patient is having an exacerbation of MS, this could possibly be  secondary to sick euthyroid and therefore needs to be rechecked when she is  better two to four weeks from now.   The patient received a consult from our nutritionist on how to lose weight.  She has been given information about attending weight loss counseling.   DISCHARGE INSTRUCTIONS:  1.  Please take a low salt, low fat diet.  2.  Follow prednisone taper as ordered.  3.  Increase quantities of iron and folic acid-containing foods.  4.  Increase activity slowly.      SR/MEDQ  D:  10/16/2004  T:  10/16/2004  Job:  [id number]

## 2010-10-27 NOTE — H&P (Signed)
NAMEDESTA, BUJAK                              ACCOUNT NO.:  [id number]   MEDICAL RECORD NO.:  [id number]                   PATIENT TYPE:  OBV   LOCATION:  NA                                   FACILITY:  [facility]   PHYSICIAN:  [place] B. Earlene Plater, M.D.               DATE OF BIRTH:  1971-02-18   DATE OF ADMISSION:  11/24/2003  DATE OF DISCHARGE:                                HISTORY & PHYSICAL   ADMISSION DIAGNOSES:  1. Persistent abnormal bleeding.  2. Uterine fibroids.  3. Previous resection of large endometrial polyps.   INTENDED PROCEDURE:  Laparoscopically assisted vaginal hysterectomy.   HISTORY OF PRESENT ILLNESS:  A 40 year old African American female, gravida  2, para 2 with a history of prolonged and heavy menstrual bleeding every  month. Approximately three months ago the patient underwent hysteroscopy  with removal of a large endometrial polyp. Despite this she has had  continued bleeding which remained heavy for up to three weeks out of the  month. She has not responded to medical treatment including high-dose  progestin. She is status post tubal ligation, desires no future children,  and is requesting definitive surgical therapy. She declined less invasive  options such as endometrial ablation. Therefore, we will proceed with a  hysterectomy.   PAST MEDICAL HISTORY:  Fibroids and multiple sclerosis.   PAST OBSTETRIC HISTORY:  Vaginal delivery times two.   PAST SURGICAL HISTORY:  Tubal ligation and hysteroscopy.   MEDICATIONS:  Betaseron, Motrin a.c. and h.s.   ALLERGIES:  None.   SOCIAL HISTORY:  No alcohol, tobacco or drugs.   FAMILY HISTORY:  Heart disease and hypertension.   REVIEW OF SYSTEMS:  Otherwise noncontributory.   PHYSICAL EXAMINATION:  VITAL SIGNS: Blood pressure 110/72, pulse 78, weight  294.  GENERAL: Alert and oriented, in no acute distress.  SKIN: Warm and dry, no lesions.  HEART: Regular rate and rhythm.  LUNGS: Clear to auscultation.  ABDOMEN: Obese. Liver and spleen are normal. No hernia.  PELVIC EXAM: Normal external genitalia. Vagina and cervix normal. Uterus  difficult to estimate size clinically due to body habitus. No adnexal masses  or tenderness.   Recent ultrasound at [facility] confirmed a slightly enlarged uterus  with three small central fibroids noted. The ovaries appeared normal.   ASSESSMENT:  Heavy menstrual bleeding and uterine fibroids. The patient  declines less invasive options, desiring definitive surgical therapy.   PLAN:  Laparoscopically-assisted vaginal hysterectomy.  The patient requests  ovarian retention if they have a normal appearance. Operative risks  discussed including infection, bleeding, and damage to bowel, bladder, and  surrounding organs and potential need to convert to abdominal hysterectomy.  All questions answered, the patient wishes to proceed.  [name], M.D.    WBD/MEDQ  D:  11/23/2003  T:  11/23/2003  Job:  [id number]

## 2010-10-27 NOTE — Op Note (Signed)
Melinda Moore, Melinda Moore                              ACCOUNT NO.:  [id number]   MEDICAL RECORD NO.:  [id number]                   PATIENT TYPE:  AMB   LOCATION:  SDC                                  [facility]ACILITY:  [facility]   PHYSICIAN:  [place] B. Earlene Plater, M.D.               DATE OF BIRTH:  25-Apr-1971   DATE OF PROCEDURE:  09/09/2003  DATE OF DISCHARGE:                                 OPERATIVE REPORT   PREOPERATIVE DIAGNOSES:  1. Abnormal bleeding.  2. Endometrial polyp.   POSTOPERATIVE DIAGNOSES:  1. Abnormal bleeding.  2. Endometrial polyp.   PROCEDURE:  Hysteroscopy with resection of a 2 cm endometrial polyp.   SURGEON:  Chester Holstein. Earlene Plater, M.D.   ANESTHESIA:  LMA and 20 mL 1% Nesacaine paracervical block.   FINDINGS:  Approximately 2 cm polyp arising from the left anterior uterine  wall.  Otherwise normal-appearing cavity.   ESTIMATED BLOOD LOSS:  50 mL.   DEFICIT:  120 mL of sorbitol.   COMPLICATIONS:  None.   INDICATIONS:  Patient with a history of heavy and irregular bleeding.  Ultrasound and saline infusion ultrasound suggestive of a large endometrial  polyp.  Also there was suggestion of a possible submucosal fibroid on saline  ultrasound.   PROCEDURE:  The patient taken to the operating room and LMA anesthesia  obtained.  She was placed in the [place] stirrups and prepped and draped in  the standard fashion.  The bladder was emptied with a red rubber catheter.  Exam under anesthesia showed a normal-size anteverted uterus, no adnexal  masses palpable.   Speculum inserted, single-tooth attached to the anterior lip of the cervix,  and paracervical block placed.   The cervix was dilated to #21 and the diagnostic hysteroscope inserted after  being flushed with sorbitol.  With good uterine distention a large  endometrial polyp was noted as outlined above from the left anterior uterine  wall.  This was attempted to be resected with the [facility] stone forceps, but  this proved  unsuccessful.   The cervix was then dilated up to #33 and the resectoscope inserted after  being flushed.  With good uterine distention similar findings were noted.  At the base of the polyp a double loop was used to resect the polyp.  Hemostasis obtained.  No other abnormalities were noted, and therefore the  procedure was terminated.  The scope was removed and single-tooth removed.  Hemostasis obtained by the anterior lip with pressure on the cervix.   The patient tolerated the procedure well.  There were no complications.  She  was taken to the recovery room awake, alert, and in stable condition.  [name], M.D.    WBD/MEDQ  D:  09/09/2003  T:  09/09/2003  Job:  [id number]

## 2010-10-27 NOTE — Op Note (Signed)
NAMEJUDENE, Melinda Moore                              ACCOUNT NO.:  [id number]   MEDICAL RECORD NO.:  [id number]                   PATIENT TYPE:  OBV   LOCATION:  9320                                 FACILITY:  [facility]   PHYSICIAN:  [name], M.D.               DATE OF BIRTH:  Jul 28, 1970   DATE OF PROCEDURE:  11/24/2003  DATE OF DISCHARGE:                                 OPERATIVE REPORT   PREOPERATIVE DIAGNOSES:  1. Abnormal uterine bleeding.  2. Fibroids.   POSTOPERATIVE DIAGNOSES:  1. Abnormal uterine bleeding.  2. Fibroids.   PROCEDURE:  Attempted laparoscopically-assisted vaginal hysterectomy,  converted to total vaginal hysterectomy.   SURGEON:  Chester Holstein. Earlene Plater, M.D.   ASSISTANT:  Genia Del, M.D.   FINDINGS:  Morbid obesity.  Abdominal-omental adhesions.  Fourteen-week  fibroid uterus.  Normal-appearing tubes and ovaries.   ESTIMATED BLOOD LOSS:  500.   COMPLICATIONS:  None.   INDICATIONS:  Patient with a history of abnormal uterine bleeding, status  post hysteroscopy with removal of polyp, with persistent bleeding and  uterine fibroids on ultrasound.  There was no evidence of a submucosal  fibroid on the saline infusion ultrasound.  The patient had been offered  endometrial ablation but declined as she desired to have definitive surgical  therapy without chance of failure and subsequent bleeding control.   PROCEDURE:  The patient was taken to the operating room and general  anesthesia attained.  She was prepped and draped in the standard fashion and  a Foley catheter was inserted into the bladder.  A Hulka tenaculum attached  to the anterior lip of the cervix.   A vertical incision made in the umbilicus and carried sharply to the fascia.  The fascia was divided sharply and elevated with Kocher clamps.  The  posterior sheath and peritoneum were entered sharply and dense omental  adhesions encountered.  These could not be overcome despite blunt  dissection,  and attempts to get around the adhesions from any direction from  the incision.  The visualization was also somewhat limited by the patient's  morbid obesity.  Given that I could not safely enter the abdomen, I decided  to close this incision and proceed directly to a TVH.  Therefore, the  fascial incision was closed with a pursestring suture of 0 Vicryl and the  skin closed with subcuticular 4-0 Vicryl.   Attention was turned to the vagina.  A weighted speculum was inserted,  posterior cul-de-sac entered sharply.  The uterosacral ligaments were  clamped on each side with curved Heaney clamp, divided sharply, and suture  ligated with 0 Vicryl and hemostasis obtained.   The remainder of the vagina was circumscribed with the Bovie.  The anterior  cul-de-sac was entered sharply and Deaver retractor placed into it.  The  cardinal ligaments were then serially clamped with the LigaSure, cauterized,  and divided sharply.  This was continued to the level of the uterine cornu,  where further visualization was not possible.  It was limited by the uterine  bulky and somewhat collapsed-in vaginal sidewalls, presumably due to the  patient's morbid obesity.   I then performed a uterine morcellation.  This was continued to the level of  the cornu and the tube and ovary on each side were then clamped with Heaney  clamps, divided sharply, and suture ligated with 0 Vicryl with hemostasis  obtained   There was a bleeder at the 7 o'clock position on the right vaginal sidewall,  which was isolated superficially with DeBakey clamps and made hemostatic  with the Bovie.  The vaginal cuff was noted to be bleeding and was run in a  locked fashion with 0 Vicryl with hemostasis obtained.  The previously-made  uterine-ovarian pedicles were inspected and were hemostatic.   The vaginal cuff was then closed with interrupted figure-of-eight sutures of  0 Vicryl.  At the level of the uterosacral ligaments, these  were  incorporated into the bites as well.  The remainder of the cuff was closed  without difficulty and was hemostatic.  The vagina was then packed with  gauze coated with Estrace cream.   The patient tolerated the procedure well and there were no complications.  She was taken to the recovery room awake, alert, and in stable condition.                                               [name], M.D.    WBD/MEDQ  D:  11/24/2003  T:  11/24/2003  Job:  [id number]

## 2010-10-27 NOTE — H&P (Signed)
Melinda Moore, Melinda Moore                              ACCOUNT NO.:  [id number]   MEDICAL RECORD NO.:  [id number]                   PATIENT TYPE:  AMB   LOCATION:  SDC                                  [facility]ACILITY:  [facility]   PHYSICIAN:  [place] B. Earlene Plater, M.D.               DATE OF BIRTH:  [birth year]   DATE OF ADMISSION:  DATE OF DISCHARGE:                                HISTORY & PHYSICAL   PREOPERATIVE DIAGNOSIS:  Abnormal bleeding; ultrasound and saline infusion  ultrasound suggestive of large endometrial polyp.   HISTORY OF PRESENT ILLNESS:  A 40 year old African-American female gravida 2  para 2 with a history of heavy bleeding approximately 3 weeks per month.  It  is heavy with associated clots and crampy pain.  At times the pain is severe  enough to have nausea.  She has had associated low back pain.  Ultrasound  and subsequent saline infusion ultrasound suggestive of a large endometrial  polyp approximately 3 cm in largest dimension as well as an anterior  submucosal fibroid approximately 1 cm in diameter.  The patient presents for  surgical diagnosis and management.   PAST MEDICAL HISTORY:  1. Fibroids.  2. Multiple sclerosis.   PAST OBSTETRICAL HISTORY:  Vaginal delivery x2.   SURGICAL HISTORY:  Tubal ligation.   MEDICATIONS:  Betaseron and Motrin.   ALLERGIES:  None.   SOCIAL HISTORY:  No alcohol, tobacco, or other drugs.   FAMILY HISTORY:  Heart disease and hypertension.   REVIEW OF SYSTEMS:  Otherwise noncontributory.   PHYSICAL EXAMINATION:  VITAL SIGNS:  Blood pressure 130/80, pulse 96, weight  304.  GENERAL:  Obese, no deformities.  NECK:  Supple, thyroid normal.  HEART:  Regular rate and rhythm.  LUNGS:  Clear to auscultation.  ABDOMEN:  Obese, no masses or hernia palpable.  LYMPH NODE SURVEY:  Negative at the neck, axilla, and groin.  PELVIC:  Normal external genitalia.  Vagina normal with normal pelvic  support.  Cervix normal.  Uterus difficult to estimate  clinically due to  body habitus.  No adnexal masses or tenderness.   Uterus on ultrasound was anteverted, 9 cm in largest dimension, with several  scattered 2 cm fibroids, one of which appeared to be anterior and  submucosal.  In addition, saline ultrasound suggestive of a 2.75 anterior  wall mass consistent with large polyp.   ASSESSMENT:  Abnormal bleeding and intracavitary mass consistent with large  polyp and possible submucosal fibroid.   PLAN:  Hysteroscopy with resectoscope to remove the above masses.  Operative  risks discussed included infection, bleeding, uterine perforation, damage to  surrounding organs, and fluid overload.  All questions answered.  The  patient wishes to proceed.  [name], M.D.    WBD/MEDQ  D:  09/09/2003  T:  09/09/2003  Job:  [id number]

## 2011-02-18 ENCOUNTER — Emergency Department (HOSPITAL_COMMUNITY): Payer: Medicare Other

## 2011-02-18 ENCOUNTER — Emergency Department (HOSPITAL_COMMUNITY)
Admission: EM | Admit: 2011-02-18 | Discharge: 2011-02-18 | Disposition: A | Discharge status: Home / Self Care | Payer: Medicare Other | Attending: Emergency Medicine | Admitting: Emergency Medicine

## 2011-02-18 DIAGNOSIS — S0990XA Unspecified injury of head, initial encounter: Secondary | ICD-10-CM | POA: Insufficient documentation

## 2011-02-18 DIAGNOSIS — R51 Headache: Secondary | ICD-10-CM | POA: Insufficient documentation

## 2011-02-18 DIAGNOSIS — W208XXA Other cause of strike by thrown, projected or falling object, initial encounter: Secondary | ICD-10-CM | POA: Insufficient documentation

## 2011-02-18 DIAGNOSIS — Y92009 Unspecified place in unspecified non-institutional (private) residence as the place of occurrence of the external cause: Secondary | ICD-10-CM | POA: Insufficient documentation

## 2011-02-18 HISTORY — DX: Bell's palsy: G51.0

## 2011-02-18 MED ORDER — KETOROLAC TROMETHAMINE 30 MG/ML IJ SOLN
30.0000 mg | Freq: Once | INTRAMUSCULAR | Status: AC
  Administered 2011-02-18: 30 mg via INTRAVENOUS
  Filled 2011-02-18: qty 1

## 2011-02-18 MED ORDER — OXYCODONE-ACETAMINOPHEN 5-325 MG PO TABS: 2.0000 | ORAL_TABLET | ORAL | Status: AC | PRN

## 2011-02-18 MED ORDER — KETOROLAC TROMETHAMINE 30 MG/ML IJ SOLN: 30.0000 mg | Freq: Once | INTRAMUSCULAR | Status: DC

## 2011-02-18 NOTE — ED Notes (Signed)
Husband reports pt was cleaning and a large ceramic vase fell and hit pt in top of head.  HUsband says pt has had slurred speech since the incident.  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  Pt alert and oriented but having difficulty speaking.  Also c/o  Headache.

## 2011-02-18 NOTE — ED Provider Notes (Signed)
History    [name] is a 40 year old female with a history of multiple sclerosis. She says that a 7-8 pound base fell from about 2 feet above her head onto the top of her head.   she complains of a headache.. initially she saw stars she denies loss of consciousness nausea vomiting or vision changes.  she denies pain anywhere else. Of he is in a really had any and he was probably he equal in all with a unit we'll send him home today and is a 50 in the halls and will do this as he is a 1 and a this is a the area he is at his and a week later and it is because of the and he is a 6 and a good and he is a reveals a little utility and is in and a will be as he he is married and is a initially she says that her speech was not affected her husband went into the room to clean up after they said fallen she was with her daughter who reported to the dad that her mother's speech was abnormal so he went in to see her and found her with speech that he says is slurred therefore they came to the emergency department for evaluation CSN: [id number] Arrival date & time: 02/18/2011  1:54 PM  Chief Complaint  Patient presents with  . Aphasia   The history is provided by the patient.    Past Medical History  Diagnosis Date  . Multiple sclerosis   . Bell's palsy     Past Surgical History  Procedure Date  . Abdominal hysterectomy     No family history on file.  History  Substance Use Topics  . Smoking status: Never Smoker   . Smokeless tobacco: Not on file  . Alcohol Use: No    OB History    Grav Para Term Preterm Abortions TAB SAB Ect Mult Living                  Review of Systems  HENT: Negative for neck pain.   Eyes: Negative for visual disturbance.  Gastrointestinal: Positive for abdominal pain. Negative for nausea and vomiting.  Musculoskeletal: Negative for back pain.  Skin: Negative for wound.  Neurological: Positive for headaches. Negative for dizziness, syncope, weakness,  light-headedness and numbness.  Psychiatric/Behavioral: Negative for confusion.    Physical Exam  BP 121/70  Pulse 56  Temp(Src) 97.6 F (36.4 C) (Oral)  Resp 20  Ht 5\' 9"  (1.753 m)  Wt 282 lb (127.914 kg)  BMI 41.64 kg/m2  SpO2 100%  Physical Exam  Constitutional: She is oriented to person, place, and time. She appears well-developed and well-nourished.  HENT:  Head: Normocephalic and atraumatic.  Eyes: Pupils are equal, round, and reactive to light.  Neck: Normal range of motion. Neck supple.  Cardiovascular: Normal rate, regular rhythm and normal heart sounds.   No murmur heard. Pulmonary/Chest: Effort normal and breath sounds normal. No respiratory distress. She has no wheezes. She has no rales. She exhibits no tenderness.  Abdominal: Soft. She exhibits no distension and no mass. There is no tenderness. There is no rebound and no guarding.  Musculoskeletal: She exhibits no edema and no tenderness.  Neurological: She is alert and oriented to person, place, and time. No cranial nerve deficit.  Skin: Skin is warm and dry.  Psychiatric: She has a normal mood and affect. Her behavior is normal.    ED Course  Procedures  Ct Head Wo Contrast  02/18/2011  *RADIOLOGY REPORT*  Clinical Data: Headache following a head injury today.  CT HEAD WITHOUT CONTRAST  Technique:  Contiguous axial images were obtained from the base of the skull through the vertex without contrast.  Comparison: None.  Findings: Minimal patchy white matter low density in both cerebral hemispheres.  No prominent ventricles and subarachnoid spaces.  No fracture or intracranial hemorrhage.  Fluid in the sphenoid sinus on the right.  IMPRESSION:  1.  No skull fracture or intracranial hemorrhage. 2.  Minimal atrophy and minimal chronic small vessel white matter ischemic changes in both cerebral hemispheres. 3.  Minimal fluid in the sphenoid sinus on the right.  Original Report Authenticated By: Darrol Angel, M.D.    3:48 PM Pt's ha is improved. Speech is NORMAL.  Feels better.   MDM Minor ha after head trauma. No neuro deficit or ms changes.      Nicholes Stairs, MD 02/18/11 [phone]

## 2011-02-28 LAB — CBC
HCT: 34.6 — ABNORMAL LOW
HCT: 35.6 — ABNORMAL LOW
Hemoglobin: 11.4 — ABNORMAL LOW
MCHC: 33.1
Platelets: 312
RDW: 14
RDW: 14.7
WBC: 3.2 — ABNORMAL LOW

## 2011-02-28 LAB — DIFFERENTIAL
Basophils Absolute: 0
Basophils Absolute: 0
Eosinophils Absolute: 0.1
Eosinophils Absolute: 0.1
Eosinophils Relative: 2
Lymphocytes Relative: 33
Lymphs Abs: 1.4
Monocytes Absolute: 0.4
Neutrophils Relative %: 41 — ABNORMAL LOW

## 2011-02-28 LAB — COMPREHENSIVE METABOLIC PANEL
AST: 10
Albumin: 3.2 — ABNORMAL LOW
Alkaline Phosphatase: 92
CO2: 27
GFR calc non Af Amer: >60
Total Bilirubin: 0.7
Total Protein: 6.2

## 2011-02-28 LAB — HEPATIC FUNCTION PANEL
ALT: 11
Total Protein: 6.4

## 2011-02-28 LAB — BASIC METABOLIC PANEL
BUN: 3 — ABNORMAL LOW
Calcium: 9.2
Creatinine, Ser: 0.62
GFR calc non Af Amer: >60
Glucose, Bld: 92
Potassium: 3.8

## 2011-02-28 LAB — URINALYSIS, ROUTINE W REFLEX MICROSCOPIC
Bilirubin Urine: NEGATIVE
Ketones, ur: NEGATIVE
Nitrite: NEGATIVE
Protein, ur: NEGATIVE
Urobilinogen, UA: 1

## 2011-02-28 LAB — LIPASE, BLOOD: Lipase: 20

## 2011-03-26 ENCOUNTER — Emergency Department (HOSPITAL_COMMUNITY)
Admission: EM | Admit: 2011-03-26 | Discharge: 2011-03-26 | Disposition: A | Discharge status: Home / Self Care | Payer: Medicare Other | Attending: Emergency Medicine | Admitting: Emergency Medicine

## 2011-03-26 ENCOUNTER — Encounter (HOSPITAL_COMMUNITY): Payer: Self-pay | Admitting: *Deleted

## 2011-03-26 DIAGNOSIS — E860 Dehydration: Secondary | ICD-10-CM

## 2011-03-26 DIAGNOSIS — R1115 Cyclical vomiting syndrome unrelated to migraine: Secondary | ICD-10-CM

## 2011-03-26 DIAGNOSIS — R42 Dizziness and giddiness: Secondary | ICD-10-CM | POA: Insufficient documentation

## 2011-03-26 DIAGNOSIS — R197 Diarrhea, unspecified: Secondary | ICD-10-CM | POA: Insufficient documentation

## 2011-03-26 DIAGNOSIS — Z87442 Personal history of urinary calculi: Secondary | ICD-10-CM | POA: Insufficient documentation

## 2011-03-26 DIAGNOSIS — G35 Multiple sclerosis: Secondary | ICD-10-CM | POA: Insufficient documentation

## 2011-03-26 LAB — COMPREHENSIVE METABOLIC PANEL
Alkaline Phosphatase: 172 U/L — ABNORMAL HIGH (ref 39–117)
BUN: 6 mg/dL (ref 6–23)
CO2: 28 mEq/L (ref 19–32)
Chloride: 100 mEq/L (ref 96–112)
GFR calc Af Amer: >90 mL/min (ref >90)
Glucose, Bld: 94 mg/dL (ref 70–99)
Potassium: 3.4 mEq/L — ABNORMAL LOW (ref 3.5–5.1)
Total Bilirubin: 0.3 mg/dL (ref 0.3–1.2)

## 2011-03-26 LAB — LIPASE, BLOOD: Lipase: 19 U/L (ref 11–59)

## 2011-03-26 LAB — URINALYSIS, ROUTINE W REFLEX MICROSCOPIC
Ketones, ur: 15 mg/dL — AB
Nitrite: NEGATIVE
Urobilinogen, UA: 2 mg/dL — ABNORMAL HIGH (ref 0.0–1.0)

## 2011-03-26 LAB — URINE MICROSCOPIC-ADD ON

## 2011-03-26 LAB — CBC
HCT: 36.4 % (ref 36.0–46.0)
Hemoglobin: 11.8 g/dL — ABNORMAL LOW (ref 12.0–15.0)
WBC: 6.3 10*3/uL (ref 4.0–10.5)

## 2011-03-26 MED ORDER — ONDANSETRON HCL 8 MG PO TABS: 8.0000 mg | ORAL_TABLET | Freq: Four times a day (QID) | ORAL | Status: AC

## 2011-03-26 MED ORDER — ONDANSETRON HCL 4 MG/2ML IJ SOLN
INTRAMUSCULAR | Status: AC
  Filled 2011-03-26: qty 2

## 2011-03-26 MED ORDER — ONDANSETRON HCL 4 MG/2ML IJ SOLN
4.0000 mg | Freq: Once | INTRAMUSCULAR | Status: AC
  Administered 2011-03-26: 4 mg via INTRAVENOUS
  Filled 2011-03-26: qty 2

## 2011-03-26 MED ORDER — ONDANSETRON HCL 4 MG/2ML IJ SOLN
4.0000 mg | Freq: Once | INTRAMUSCULAR | Status: AC
  Administered 2011-03-26: 4 mg via INTRAVENOUS

## 2011-03-26 MED ORDER — PROMETHAZINE HCL 25 MG/ML IJ SOLN
INTRAMUSCULAR | Status: AC
  Administered 2011-03-26: 22:00:00
  Filled 2011-03-26: qty 1

## 2011-03-26 MED ORDER — SODIUM CHLORIDE 0.9 % IV SOLN
Freq: Once | INTRAVENOUS | Status: AC
  Administered 2011-03-26: 20:00:00 via INTRAVENOUS

## 2011-03-26 MED ORDER — PROMETHAZINE HCL 25 MG RE SUPP: RECTAL | Status: DC

## 2011-03-26 NOTE — ED Provider Notes (Signed)
Medical screening examination/treatment/procedure(s) were performed by non-physician practitioner and as supervising physician I was immediately available for consultation/collaboration.   Shelda Jakes, MD 03/26/11 2212

## 2011-03-26 NOTE — ED Notes (Signed)
Sitting up , seems to be feeling better, watching tv No distress.

## 2011-03-26 NOTE — ED Notes (Signed)
Patient now actively vomiting, EDPA aware, order given.

## 2011-03-26 NOTE — ED Provider Notes (Signed)
History     CSN: [id number] Arrival date & time: 03/26/2011  5:47 PM  Chief Complaint  Patient presents with  . Emesis    (Consider location/radiation/quality/duration/timing/severity/associated sxs/prior treatment) Patient is a 40 y.o. female presenting with vomiting. The history is provided by the patient and the spouse.  Emesis  This is a new problem. Episode onset: 5 days ago. The problem occurs 5 to 10 times per day. The problem has not changed since onset.The emesis has an appearance of bilious material. Fever duration: pt describes brief subjective fever and one episode of diarrhea early in the course of illness.  she took one po zofran but vomited back up. Associated symptoms include diarrhea and a fever. Pertinent negatives include no abdominal pain and no cough. Associated symptoms comments: Urine "dark and less frequent".  Feels "light-headed" with standing..    Past Medical History  Diagnosis Date  . Multiple sclerosis   . Bell's palsy   . Renal disorder     kidney stones    Past Surgical History  Procedure Date  . Abdominal hysterectomy   . Cholecystectomy     No family history on file.  History  Substance Use Topics  . Smoking status: Never Smoker   . Smokeless tobacco: Not on file  . Alcohol Use: No    OB History    Grav Para Term Preterm Abortions TAB SAB Ect Mult Living                  Review of Systems  Constitutional: Positive for fever.  Respiratory: Negative for cough.   Gastrointestinal: Positive for nausea, vomiting and diarrhea. Negative for abdominal pain.  Genitourinary: Positive for decreased urine volume.  Neurological: Positive for light-headedness.  All other systems reviewed and are negative.    Allergies  Oxycodone and Tape  Home Medications   Current Outpatient Rx  Name Route Sig Dispense Refill  . DIAZEPAM 5 MG PO TABS Oral Take 5 mg by mouth daily as needed. For anxiety     . FINGOLIMOD HCL 0.5 MG PO CAPS Oral Take 1  capsule by mouth daily.      Marland Kitchen GABAPENTIN 100 MG PO CAPS Oral Take 100 mg by mouth daily as needed. For tingling and nerves     . GABAPENTIN 300 MG PO CAPS Oral Take 300 mg by mouth at bedtime as needed. For tingling     . OVER THE COUNTER MEDICATION Oral Take 2 capsules by mouth 2 (two) times daily. LIPOZENE for weight loss     . POLYETHYLENE GLYCOL 400 0.25 % OP SOLN Ophthalmic Apply 1 drop to eye at bedtime.      Marland Kitchen POTASSIUM CHLORIDE 8 MEQ PO TBCR Oral Take 8 mEq by mouth daily as needed. When taking (LASIX) Furosemide     . VERAPAMIL HCL 120 MG PO TABS Oral Take 120 mg by mouth 3 (three) times daily.      . FUROSEMIDE 20 MG PO TABS Oral Take 20 mg by mouth daily as needed. For fluid retention       BP 123/70  Pulse 66  Temp(Src) 98.3 F (36.8 C) (Oral)  Resp 18  Ht 5\' 9"  (1.753 m)  Wt 277 lb (125.646 kg)  BMI 40.91 kg/m2  SpO2 100%  Physical Exam  Nursing note and vitals reviewed. Constitutional: She is oriented to person, place, and time. She appears well-developed and well-nourished. No distress.  HENT:  Head: Normocephalic and atraumatic.  Mouth/Throat: Uvula is midline. Mucous  membranes are dry.  Eyes: EOM are normal.  Neck: Normal range of motion.  Cardiovascular: Normal rate, regular rhythm and normal heart sounds.   Pulmonary/Chest: Effort normal and breath sounds normal.  Abdominal: Soft. Normal appearance and bowel sounds are normal. She exhibits no distension. There is no hepatosplenomegaly. There is no tenderness. There is no rebound and no CVA tenderness.  Musculoskeletal: Normal range of motion.  Neurological: She is alert and oriented to person, place, and time.  Skin: Skin is warm and dry.  Psychiatric: She has a normal mood and affect. Judgment normal.    ED Course  Procedures (including critical care time)   Labs Reviewed  CBC  COMPREHENSIVE METABOLIC PANEL  LIPASE, BLOOD  URINALYSIS, ROUTINE W REFLEX MICROSCOPIC   No results found.   No  diagnosis found.    MDM  10:00 PM Pt is feeling much better after meds and 2 liters of IV fluids.        Worthy Rancher, PA 03/26/11 [phone]

## 2011-03-26 NOTE — ED Notes (Signed)
N/v x 4 days with right sided back pain.  Pt unable to eat/drink.

## 2011-05-28 ENCOUNTER — Ambulatory Visit (HOSPITAL_COMMUNITY): Payer: Medicare Other | Admitting: Physical Therapy

## 2011-05-29 ENCOUNTER — Ambulatory Visit (HOSPITAL_COMMUNITY): Payer: Medicare Other | Admitting: Physical Therapy

## 2011-06-01 ENCOUNTER — Ambulatory Visit (HOSPITAL_COMMUNITY): Payer: Medicare Other

## 2011-06-11 ENCOUNTER — Ambulatory Visit (HOSPITAL_COMMUNITY): Payer: Medicare Other | Admitting: Physical Therapy

## 2011-06-20 ENCOUNTER — Ambulatory Visit (HOSPITAL_COMMUNITY): Payer: Medicare Other | Admitting: Physical Therapy

## 2011-07-30 DIAGNOSIS — G35 Multiple sclerosis: Secondary | ICD-10-CM | POA: Diagnosis not present

## 2011-07-30 DIAGNOSIS — G589 Mononeuropathy, unspecified: Secondary | ICD-10-CM | POA: Diagnosis not present

## 2011-09-17 DIAGNOSIS — G35 Multiple sclerosis: Secondary | ICD-10-CM | POA: Diagnosis not present

## 2011-09-17 DIAGNOSIS — G589 Mononeuropathy, unspecified: Secondary | ICD-10-CM | POA: Diagnosis not present

## 2011-10-06 DIAGNOSIS — G35 Multiple sclerosis: Secondary | ICD-10-CM | POA: Diagnosis not present

## 2011-10-29 DIAGNOSIS — G5790 Unspecified mononeuropathy of unspecified lower limb: Secondary | ICD-10-CM | POA: Diagnosis not present

## 2011-10-29 DIAGNOSIS — G8929 Other chronic pain: Secondary | ICD-10-CM | POA: Diagnosis not present

## 2011-10-29 DIAGNOSIS — R5383 Other fatigue: Secondary | ICD-10-CM | POA: Diagnosis not present

## 2011-10-29 DIAGNOSIS — G35 Multiple sclerosis: Secondary | ICD-10-CM | POA: Diagnosis not present

## 2011-11-28 DIAGNOSIS — G35 Multiple sclerosis: Secondary | ICD-10-CM | POA: Diagnosis not present

## 2011-11-28 DIAGNOSIS — IMO0002 Reserved for concepts with insufficient information to code with codable children: Secondary | ICD-10-CM | POA: Diagnosis not present

## 2011-11-28 DIAGNOSIS — M62838 Other muscle spasm: Secondary | ICD-10-CM | POA: Diagnosis not present

## 2011-11-29 DIAGNOSIS — F419 Anxiety disorder, unspecified: Secondary | ICD-10-CM | POA: Insufficient documentation

## 2011-11-29 DIAGNOSIS — R519 Headache, unspecified: Secondary | ICD-10-CM | POA: Insufficient documentation

## 2011-11-29 DIAGNOSIS — R51 Headache: Secondary | ICD-10-CM | POA: Insufficient documentation

## 2012-01-01 DIAGNOSIS — I739 Peripheral vascular disease, unspecified: Secondary | ICD-10-CM | POA: Diagnosis not present

## 2012-01-21 DIAGNOSIS — D509 Iron deficiency anemia, unspecified: Secondary | ICD-10-CM | POA: Diagnosis not present

## 2012-01-21 DIAGNOSIS — R358 Other polyuria: Secondary | ICD-10-CM | POA: Diagnosis not present

## 2012-01-21 DIAGNOSIS — I871 Compression of vein: Secondary | ICD-10-CM | POA: Diagnosis not present

## 2012-01-21 DIAGNOSIS — G35 Multiple sclerosis: Secondary | ICD-10-CM | POA: Diagnosis not present

## 2012-02-13 ENCOUNTER — Ambulatory Visit (HOSPITAL_COMMUNITY)
Admission: RE | Admit: 2012-02-13 | Discharge: 2012-02-13 | Disposition: A | Discharge status: Home / Self Care | Payer: Medicare Other | Source: Ambulatory Visit | Attending: Family Medicine | Admitting: Family Medicine

## 2012-02-13 ENCOUNTER — Other Ambulatory Visit: Payer: Self-pay | Admitting: Family Medicine

## 2012-02-13 DIAGNOSIS — I824Z9 Acute embolism and thrombosis of unspecified deep veins of unspecified distal lower extremity: Secondary | ICD-10-CM | POA: Diagnosis not present

## 2012-02-13 DIAGNOSIS — R600 Localized edema: Secondary | ICD-10-CM

## 2012-02-13 DIAGNOSIS — M79609 Pain in unspecified limb: Secondary | ICD-10-CM | POA: Insufficient documentation

## 2012-02-13 DIAGNOSIS — M7989 Other specified soft tissue disorders: Secondary | ICD-10-CM | POA: Diagnosis not present

## 2012-02-18 DIAGNOSIS — I824Z9 Acute embolism and thrombosis of unspecified deep veins of unspecified distal lower extremity: Secondary | ICD-10-CM | POA: Diagnosis not present

## 2012-02-21 DIAGNOSIS — I824Z9 Acute embolism and thrombosis of unspecified deep veins of unspecified distal lower extremity: Secondary | ICD-10-CM | POA: Diagnosis not present

## 2012-02-21 DIAGNOSIS — D689 Coagulation defect, unspecified: Secondary | ICD-10-CM | POA: Diagnosis not present

## 2012-02-25 ENCOUNTER — Encounter: Payer: Self-pay | Admitting: *Deleted

## 2012-03-10 DIAGNOSIS — I824Y9 Acute embolism and thrombosis of unspecified deep veins of unspecified proximal lower extremity: Secondary | ICD-10-CM | POA: Diagnosis not present

## 2012-03-10 DIAGNOSIS — Z23 Encounter for immunization: Secondary | ICD-10-CM | POA: Diagnosis not present

## 2012-03-19 DIAGNOSIS — G35 Multiple sclerosis: Secondary | ICD-10-CM | POA: Diagnosis not present

## 2012-06-02 ENCOUNTER — Other Ambulatory Visit: Payer: Self-pay | Admitting: Family Medicine

## 2012-06-02 ENCOUNTER — Ambulatory Visit (HOSPITAL_COMMUNITY)
Admission: RE | Admit: 2012-06-02 | Discharge: 2012-06-02 | Disposition: A | Discharge status: Home / Self Care | Payer: 59 | Source: Ambulatory Visit | Attending: Family Medicine | Admitting: Family Medicine

## 2012-06-02 DIAGNOSIS — R609 Edema, unspecified: Secondary | ICD-10-CM | POA: Diagnosis not present

## 2012-06-02 DIAGNOSIS — I82409 Acute embolism and thrombosis of unspecified deep veins of unspecified lower extremity: Secondary | ICD-10-CM | POA: Diagnosis not present

## 2012-06-02 DIAGNOSIS — M79605 Pain in left leg: Secondary | ICD-10-CM

## 2012-06-02 DIAGNOSIS — M79609 Pain in unspecified limb: Secondary | ICD-10-CM | POA: Insufficient documentation

## 2012-06-12 DIAGNOSIS — I82409 Acute embolism and thrombosis of unspecified deep veins of unspecified lower extremity: Secondary | ICD-10-CM | POA: Diagnosis not present

## 2012-06-17 DIAGNOSIS — J019 Acute sinusitis, unspecified: Secondary | ICD-10-CM | POA: Diagnosis not present

## 2012-06-25 DIAGNOSIS — I82409 Acute embolism and thrombosis of unspecified deep veins of unspecified lower extremity: Secondary | ICD-10-CM | POA: Diagnosis not present

## 2012-08-14 DIAGNOSIS — I82409 Acute embolism and thrombosis of unspecified deep veins of unspecified lower extremity: Secondary | ICD-10-CM | POA: Diagnosis not present

## 2012-08-24 ENCOUNTER — Encounter: Payer: Self-pay | Admitting: *Deleted

## 2012-09-02 ENCOUNTER — Encounter (HOSPITAL_COMMUNITY): Payer: 59 | Attending: Oncology | Admitting: Oncology

## 2012-09-02 ENCOUNTER — Encounter (HOSPITAL_COMMUNITY): Payer: Self-pay | Admitting: Oncology

## 2012-09-02 VITALS — BP 119/76 | HR 87 | Temp 98.0°F | Resp 98 | Wt 314.8 lb

## 2012-09-02 DIAGNOSIS — G8929 Other chronic pain: Secondary | ICD-10-CM

## 2012-09-02 DIAGNOSIS — F329 Major depressive disorder, single episode, unspecified: Secondary | ICD-10-CM | POA: Diagnosis not present

## 2012-09-02 DIAGNOSIS — I82402 Acute embolism and thrombosis of unspecified deep veins of left lower extremity: Secondary | ICD-10-CM

## 2012-09-02 DIAGNOSIS — G35 Multiple sclerosis: Secondary | ICD-10-CM | POA: Diagnosis not present

## 2012-09-02 DIAGNOSIS — I82409 Acute embolism and thrombosis of unspecified deep veins of unspecified lower extremity: Secondary | ICD-10-CM | POA: Insufficient documentation

## 2012-09-02 DIAGNOSIS — Z95828 Presence of other vascular implants and grafts: Secondary | ICD-10-CM

## 2012-09-02 HISTORY — DX: Acute embolism and thrombosis of unspecified deep veins of unspecified lower extremity: I82.409

## 2012-09-02 MED ORDER — SODIUM CHLORIDE 0.9 % IJ SOLN
10.0000 mL | INTRAMUSCULAR | Status: DC | PRN
  Administered 2012-09-02: 10 mL via INTRAVENOUS
  Filled 2012-09-02: qty 10

## 2012-09-02 MED ORDER — HEPARIN SOD (PORK) LOCK FLUSH 100 UNIT/ML IV SOLN
INTRAVENOUS | Status: AC
  Filled 2012-09-02: qty 5

## 2012-09-02 MED ORDER — HEPARIN SOD (PORK) LOCK FLUSH 100 UNIT/ML IV SOLN
500.0000 [IU] | Freq: Once | INTRAVENOUS | Status: AC
  Administered 2012-09-02: 500 [IU] via INTRAVENOUS
  Filled 2012-09-02: qty 5

## 2012-09-02 NOTE — Progress Notes (Signed)
Proper placement of portacath confirmed by CXR.  Portacath located left chest wall accessed with  H 20 needle.  Good blood return present. Portacath flushed with 20ml NS and 500U/77ml Heparin and needle removed intact.  Procedure tolerated well and without incident.

## 2012-09-02 NOTE — Patient Instructions (Signed)
[facility] Discharge Instructions  RECOMMENDATIONS MADE BY THE CONSULTANT AND ANY TEST RESULTS WILL BE SENT TO YOUR REFERRING PHYSICIAN.  EXAM FINDINGS BY THE PHYSICIAN TODAY AND SIGNS OR SYMPTOMS TO REPORT TO CLINIC OR PRIMARY PHYSICIAN: Exam findings as discussed by T. Kefalas, PA-C.  SPECIAL INSTRUCTIONS/FOLLOW-UP: 1.  Take your Coumadin as prescribed through this Saturday (09/06/12). 2.  Return in 2 weeks for a hypercoagulation panel (lab test). 3.  Return in 4 weeks for office visit for follow-up visit. 4.  Keep your feet elevated above the level of your belly button when at rest.  Otherwise, try to increase your activity as tolerated.  Thank you for choosing [facility] to provide your oncology and hematology care.  To afford each patient quality time with our providers, please arrive at least 15 minutes before your scheduled appointment time.  With your help, our goal is to use those 15 minutes to complete the necessary work-up to ensure our physicians have the information they need to help with your evaluation and healthcare recommendations.    Effective January 1st, 2014, we ask that you re-schedule your appointment with our physicians should you arrive 10 or more minutes late for your appointment.  We strive to give you quality time with our providers, and arriving late affects you and other patients whose appointments are after yours.    Again, thank you for choosing [facility].  Our hope is that these requests will decrease the amount of time that you wait before being seen by our physicians.       _____________________________________________________________  Should you have questions after your visit to [facility], please contact our office at [phone] between the hours of 8:30 a.m. and 5:00 p.m.  Voicemails left after 4:30 p.m. will not be returned until the following business day.  For prescription refill  requests, have your pharmacy contact our office with your prescription refill request.

## 2012-09-02 NOTE — Progress Notes (Signed)
[facility] NEW PATIENT EVALUATION   Name: Melinda Moore Date: 09/02/2012 MRN: [id number] DOB: [birth year]    CC: Melinda Punt, MD     DIAGNOSIS: The primary encounter diagnosis was MS (multiple sclerosis). A diagnosis of DVT (deep venous thrombosis), left was also pertinent to this visit.   HISTORY OF PRESENT ILLNESS:Melinda Moore is a 42 y.o. female who is 42 year old woman with a past medical history significant for multiple sclerosis, chronic pain, HTN, and depression who was diagnosed on 02/13/2012 with a left calf and popliteal vein DVT.  She is now on Coumadin and this is managed by her primary care physician, Dr. Lilyan Moore, for evaluation of DVT and length of anticoagulation.   Melinda Moore reports that in August she noticed her left leg 2 times the size of her right.  It was warm to the touch.  She followed up with her PCP who, according to the patient, tried lasix to decrease the swelling without success.  As a result, a left LE doppler US was performed illustrating the DVT.  She was subsequently treated with Lovenox bridging to Coumadin.  Her PCP has been managing her Coumadin.  She dnies any long trips or recent surgeries surrounding the time of diagnosis.  As a matter of fact, her last trip was greater than 9 months prior to her diagnosis and that was an automobile trip out of state.  She denies any air travel.  Her last surgery was in 2006.    She has MS and was diagnosed on 1997 by Dr. Phineas Real.  She has been seeing Dr. Harriette Bouillon at [facility] until he left he practice.  She now sees Dr. Harlen Labs at [facility] for her neurology care.  She is on medication for her MS including Gilenya.  She admits that she is less mobile more recently compared to 2 years prior due to progression of MS.  She spends about 90% of her time in a sitting or resting position.  She is unable to walk 1 mile.  She must take breaks during activities.  She is unable to make a meal from preparation  through clean-up without a few breaks.    She denies any B symptoms including fevers, chills, night sweats, unintentional weight loss, early satiety.  She denies any headaches, dizziness, double vision, nausea, vomiting, diarrhea, abdominal pain, chest pain, heart palpitations, black tarry stool, urinary pain/burning/frequency, hematuria.  She does admit to constipation and BRBPR occasionally.   We provided the patient education regarding her blood clot.  Her obesity in addition to immobility make her at increased risk for a DVT event.  Her obesity causes back pressure in her legs and therefore decreased venous flow back to right side of heart.  She is recommended to have LE elevated above the level of the umbilicus.  She was encouraged to remain as active as possible although we must keep in mind her disease.  We have developed a plan and that is illustrated below in the plan.  She has completed 6 months worth of anticoagulation and we will therefore ask her to finish Coumadin at the end of this week.    FAMILY HISTORY: family history includes Heart disease in her father; Hyperlipidemia in her father; and Hypertension in her sister. No family history of blood clots Father deceased age 38 from CHF.  He was on Coumadin but likely for heart disease. Mother alive and well at age 75 Daughter 9 who is well Daughter  17 with hypercholesterolemia and obesity.   PAST MEDICAL HISTORY:  has a past medical history of Multiple sclerosis; Bell's palsy; MS (multiple sclerosis) (1997); DVT (deep venous thrombosis) (02/13/2012); MS (multiple sclerosis) (09/02/2012); and DVT (deep venous thrombosis) (09/02/2012).      CURRENT MEDICATIONS: See CHL   SOCIAL HISTORY:  From [facility] and lives locally.  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 She is on disability for her MS.  She has been married to her husband x 17 years and he is the father to both children.  She lives with her husband and 2  daughters at home.  She denies EtOH and illicit drug abuse.  She admits to a distant history of 1/4 ppd of cigarettes x 5 years quitting more than 20 years ago.   GYNECOLOGIC HISTORY: Menarche at the age of 45 Surgical menopause after hysterectomy in 2006 G2P2 She did not breast feed her daughters.  She is not up-to-date on her PAP smears she reports.    SURGICAL HISTORY: Hysterectomy without oophorectomy in 2006 for benign reasons Tubal ligation in 2004 Cholecystectomy in 2009 by Dr. Lovell Sheehan.   ALLERGIES: Oxycodone and Tape   LABORATORY DATA:  No results found for this or any previous visit (from the past 48 hour(s)).     RADIOGRAPHY:  02/13/2012  *RADIOLOGY REPORT*  Clinical Data: 41 year old female with left lower extremity  swelling and pain. Edema and numbness.  LEFT LOWER EXTREMITY VENOUS DUPLEX ULTRASOUND  Technique: Gray-scale sonography with graded compression, as well  as color Doppler and duplex ultrasound, were performed to evaluate  the deep venous system of the lower extremity from the level of the  common femoral vein through the popliteal and proximal calf veins.  Spectral Doppler was utilized to evaluate flow at rest and with  distal augmentation maneuvers.  Comparison: None.  Findings: Normal compressibility and spectral flow with  augmentation in the common femoral vein, femoral vein saphenous  vein junction, profunda femoral vein, and more distal left femoral  vein. Popliteal vein likewise appears normally compressible and  with detectable flow.  In the left calf the posterior tibial veins are incompressible  (image 43) and demonstrate absent flow on color and spectral  Doppler. There is associated calf subcutaneous edema (image 49).  Visualized greater saphenous vein is normally compressible.  Incidental normal left inguinal lymph node.  IMPRESSION:  Positive for acute DVT in the left calf veins, posterior tibial  veins. Calf edema noted.   Critical Value/emergent results were called by telephone at the  time of interpretation on 02/13/2012 at 1300 hours to Dr. Ardyth Gal, who verbally acknowledged these results.  Original Report Authenticated By: Harley Hallmark, M.D.      REVIEW OF SYSTEMS: Patient reports no health concerns.   PHYSICAL EXAM:  weight is 314 lb 12.8 oz (142.792 kg). Her oral temperature is 98 F (36.7 C). Her blood pressure is 119/76 and her pulse is 87. Her respiration is 98.  General appearance: alert, cooperative, appears older than stated age, no distress and morbidly obese Head: Normocephalic, without obvious abnormality, atraumatic, increase facial hair Neck: no adenopathy, supple, symmetrical, trachea midline and thyroid not enlarged, symmetric, no tenderness/mass/nodules Lymph nodes: Cervical, supraclavicular, and axillary nodes normal. Resp: clear to auscultation bilaterally and normal percussion bilaterally Back: symmetric, no curvature. ROM normal. No CVA tenderness. Cardio: regular rate and rhythm, S1, S2 normal, no murmur, click, rub or gallop GI: non-tender; bowel sounds normal; no masses,  no organomegaly.  Abdominal thickening in the upper quadrants.  Extremities: extremities normal, atraumatic, no cyanosis and edema B/L 1 + pitting edema Neurologic: Grossly normal     IMPRESSION:  1. Left LE DVT diagnosied on 02/13/2012 with calf and popliteal vein DVT. Now on Coumadin anticoagulation. She is S/P 6 months worth of anticoagulation.  She is educated to stop Coumadin at the end of this week.   2. Multiple sclerosis 3. Chronic pain 4. HTN 5. Depression    PLAN:  1. I personally reviewed and went over laboratory results with the patient. 2. Port flush today 3. Coumadin through the end of this week (Saturday). This will complete 6 months of anticoagulation.   4. Cancel INR with PCP 5. Return in 2-3 weeks for labs: CBC diff, CMET, hypercoagulable panel 6. Patient education regarding  DVT 7. Recommended LE elevation when resting.  8. Recommended physical activity as much as possible 9. If hypercoagulable panel is negative, will pursue CT CAP with contrast pending renal function results to evaluate for occult malignancy.  10. Return in 4 weeks for follow-up.   All questions were answered.  The patient knows to call the clinic with any questions or concerns.    Patient and plan discussed with Dr. Mariel Sleet and he is in agreement with the aforementioned.  Patient seen and examined by Dr. Mariel Sleet as well.   Jessicia Napolitano

## 2012-09-05 ENCOUNTER — Ambulatory Visit: Payer: Self-pay | Admitting: Family Medicine

## 2012-09-05 ENCOUNTER — Telehealth: Payer: Self-pay | Admitting: Family Medicine

## 2012-09-05 MED ORDER — HYDROMORPHONE HCL 4 MG PO TABS: 4.0000 mg | ORAL_TABLET | Freq: Two times a day (BID) | ORAL | Status: DC

## 2012-09-05 NOTE — Telephone Encounter (Signed)
Ok for one refill

## 2012-09-05 NOTE — Telephone Encounter (Signed)
1 rx printed to sign by provider for pt pickup

## 2012-09-05 NOTE — Telephone Encounter (Signed)
Nurse: 3:03 Patient: Melinda Moore dob 1970/09/09 CB: [phone] RX: Hydromorphone 4mg  Pharm: [facility].  MSG: Written Prescription needed * Pharm. Directed patient to call provider for a written order

## 2012-09-08 ENCOUNTER — Telehealth: Payer: Self-pay | Admitting: *Deleted

## 2012-09-08 NOTE — Telephone Encounter (Signed)
Spoke with pt of rx at front desk for pick up

## 2012-09-11 ENCOUNTER — Other Ambulatory Visit: Payer: Self-pay | Admitting: Family Medicine

## 2012-09-12 ENCOUNTER — Other Ambulatory Visit: Payer: Self-pay | Admitting: *Deleted

## 2012-09-13 ENCOUNTER — Other Ambulatory Visit: Payer: Self-pay

## 2012-09-13 DIAGNOSIS — Z7901 Long term (current) use of anticoagulants: Secondary | ICD-10-CM | POA: Insufficient documentation

## 2012-09-16 ENCOUNTER — Other Ambulatory Visit (HOSPITAL_COMMUNITY): Payer: Medicare Other

## 2012-09-16 ENCOUNTER — Other Ambulatory Visit (HOSPITAL_COMMUNITY): Payer: Self-pay | Admitting: Oncology

## 2012-09-16 DIAGNOSIS — I82402 Acute embolism and thrombosis of unspecified deep veins of left lower extremity: Secondary | ICD-10-CM

## 2012-09-17 ENCOUNTER — Ambulatory Visit (HOSPITAL_COMMUNITY)
Admission: RE | Admit: 2012-09-17 | Discharge: 2012-09-17 | Disposition: A | Discharge status: Home / Self Care | Payer: 59 | Source: Ambulatory Visit | Attending: Oncology | Admitting: Oncology

## 2012-09-17 DIAGNOSIS — M79609 Pain in unspecified limb: Secondary | ICD-10-CM | POA: Insufficient documentation

## 2012-09-17 DIAGNOSIS — I82402 Acute embolism and thrombosis of unspecified deep veins of left lower extremity: Secondary | ICD-10-CM

## 2012-09-22 ENCOUNTER — Encounter (HOSPITAL_COMMUNITY): Payer: Self-pay

## 2012-09-22 ENCOUNTER — Other Ambulatory Visit (HOSPITAL_COMMUNITY): Payer: Medicare Other

## 2012-09-23 ENCOUNTER — Other Ambulatory Visit (HOSPITAL_COMMUNITY): Payer: Medicare Other

## 2012-09-24 ENCOUNTER — Encounter (HOSPITAL_COMMUNITY): Payer: 59 | Attending: Oncology

## 2012-09-24 DIAGNOSIS — I82402 Acute embolism and thrombosis of unspecified deep veins of left lower extremity: Secondary | ICD-10-CM

## 2012-09-24 DIAGNOSIS — R894 Abnormal immunological findings in specimens from other organs, systems and tissues: Secondary | ICD-10-CM | POA: Insufficient documentation

## 2012-09-24 DIAGNOSIS — I82409 Acute embolism and thrombosis of unspecified deep veins of unspecified lower extremity: Secondary | ICD-10-CM | POA: Diagnosis not present

## 2012-09-24 LAB — ANTITHROMBIN III: AntiThromb III Func: 110 % (ref 75–120)

## 2012-09-24 LAB — CBC WITH DIFFERENTIAL/PLATELET
Basophils Absolute: 0 10*3/uL (ref 0.0–0.1)
Eosinophils Absolute: 0.1 10*3/uL (ref 0.0–0.7)
Eosinophils Relative: 3 % (ref 0–5)
HCT: 32.9 % — ABNORMAL LOW (ref 36.0–46.0)
Lymphocytes Relative: 6 % — ABNORMAL LOW (ref 12–46)
Lymphs Abs: 0.2 10*3/uL — ABNORMAL LOW (ref 0.7–4.0)
MCH: 26 pg (ref 26.0–34.0)
MCV: 80.8 fL (ref 78.0–100.0)
Monocytes Absolute: 0.4 10*3/uL (ref 0.1–1.0)
RDW: 17.8 % — ABNORMAL HIGH (ref 11.5–15.5)
WBC: 2.7 10*3/uL — ABNORMAL LOW (ref 4.0–10.5)

## 2012-09-24 LAB — COMPREHENSIVE METABOLIC PANEL
CO2: 27 mEq/L (ref 19–32)
Calcium: 8.5 mg/dL (ref 8.4–10.5)
Creatinine, Ser: 0.63 mg/dL (ref 0.50–1.10)
GFR calc Af Amer: >90 mL/min (ref >90)
GFR calc non Af Amer: >90 mL/min (ref >90)
Glucose, Bld: 107 mg/dL — ABNORMAL HIGH (ref 70–99)
Total Protein: 7.3 g/dL (ref 6.0–8.3)

## 2012-09-24 NOTE — Progress Notes (Signed)
Labs drawn today for antitrombin 3, B2gly,cardiolipin,cbc/diff,cmp,factor 5,homocystein,lupus,protein C & S, prothrombin

## 2012-09-25 LAB — BETA-2-GLYCOPROTEIN I ABS, IGG/M/A
Beta-2 Glyco I IgG: 0 G Units (ref <20)
Beta-2-Glycoprotein I IgA: 0 A Units (ref <20)

## 2012-09-25 LAB — CARDIOLIPIN ANTIBODIES, IGG, IGM, IGA: Anticardiolipin IgG: 34 GPL U/mL — ABNORMAL HIGH (ref <23)

## 2012-09-25 LAB — PROTEIN C ACTIVITY: Protein C Activity: 162 % — ABNORMAL HIGH (ref 75–133)

## 2012-09-25 LAB — HOMOCYSTEINE: Homocysteine: 7.4 umol/L (ref 4.0–15.4)

## 2012-09-25 LAB — LUPUS ANTICOAGULANT PANEL: PTT Lupus Anticoagulant: 37.4 secs (ref 28.0–43.0)

## 2012-09-25 LAB — PROTEIN S ACTIVITY: Protein S Activity: 97 % (ref 69–129)

## 2012-09-26 LAB — PROTHROMBIN GENE MUTATION

## 2012-09-26 LAB — PROTEIN S, TOTAL: Protein S Ag, Total: 87 % (ref 60–150)

## 2012-09-26 LAB — FACTOR 5 LEIDEN

## 2012-09-26 LAB — PROTEIN C, TOTAL: Protein C, Total: 73 % (ref 72–160)

## 2012-09-29 ENCOUNTER — Telehealth: Payer: Self-pay

## 2012-09-29 NOTE — Telephone Encounter (Signed)
If she is not having abdominal pain, profuse rectal bleeding, then I would say a non-urgent appt next available. Again, non-urgent.

## 2012-09-29 NOTE — Telephone Encounter (Signed)
Pt has an appointment on May 1 @10 :30 with AS.

## 2012-09-29 NOTE — Telephone Encounter (Signed)
When she has a bowel movement something gray is coming out. She would like to be seen this week if possible. Please advise

## 2012-09-30 ENCOUNTER — Ambulatory Visit (HOSPITAL_COMMUNITY): Payer: Medicare Other | Admitting: Oncology

## 2012-10-01 ENCOUNTER — Ambulatory Visit (INDEPENDENT_AMBULATORY_CARE_PROVIDER_SITE_OTHER): Payer: 59 | Admitting: Gastroenterology

## 2012-10-01 ENCOUNTER — Encounter: Payer: Self-pay | Admitting: Gastroenterology

## 2012-10-01 VITALS — BP 117/79 | HR 112 | Temp 98.2°F | Ht 70.0 in | Wt 315.8 lb

## 2012-10-01 DIAGNOSIS — K625 Hemorrhage of anus and rectum: Secondary | ICD-10-CM | POA: Insufficient documentation

## 2012-10-01 DIAGNOSIS — R748 Abnormal levels of other serum enzymes: Secondary | ICD-10-CM | POA: Diagnosis not present

## 2012-10-01 DIAGNOSIS — D649 Anemia, unspecified: Secondary | ICD-10-CM | POA: Insufficient documentation

## 2012-10-01 DIAGNOSIS — K5900 Constipation, unspecified: Secondary | ICD-10-CM | POA: Diagnosis not present

## 2012-10-01 MED ORDER — LINACLOTIDE 290 MCG PO CAPS: 290.0000 ug | ORAL_CAPSULE | Freq: Every day | ORAL | Status: DC

## 2012-10-01 MED ORDER — LINACLOTIDE 145 MCG PO CAPS: 145.0000 ug | ORAL_CAPSULE | Freq: Every day | ORAL | Status: DC

## 2012-10-01 NOTE — Assessment & Plan Note (Addendum)
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 Intermittent rectal bleeding with no prior colonoscopy. Patient is status post hysterectomy. She does not report any hematuria, nosebleeds. Potentially related to medication. Recommend further workup. Discussed possibility of colonoscopy with the patient today and she is somewhat reluctant. She would like for me to address further with Dr. Jena Gauss and if felt to be absolutely necessary she would pursue it.  Consider iron and TIBC, ferritin.

## 2012-10-01 NOTE — Assessment & Plan Note (Signed)
42 year old lady with history of multiple sclerosis, chronic anemia/leukopenia he presents for further evaluation of passage of unidentifiable objects in her stool. On 3 occasions she has noted blueberry sized, white with bluish tinted round objects either in the toilet bowl or attached to her stool. Specimen has been submitted by her PCP for analysis previously. Patient reports that the object was determined to be food particle. She brought one today which she has been storing in watch her for several days. Object appeared white in color with a hard outer coating. With palpation, it disintegrated and brown substance was released that immediately dissolved. I suspect this could have been pill remnant or food. Unlikely parasite. Provided patient with reassurance.  Regarding constipation, trial of Linzess 290 mcg po daily. Samples provided. Would have liked to try but none available.

## 2012-10-01 NOTE — Assessment & Plan Note (Signed)
Elevated on at least 2 occasions. Transaminases and bilirubin are normal. Could be medication related. Patient has upcoming followup with Dr. Thornton Papas office and  according to records from his office she may potentially be getting a CT scan to rule out occult malignancy in the setting of unexplained DVT. Will followup records from her upcoming visit and if they imaging study pursued at that time, consider further workup of alkaline phosphatase then.

## 2012-10-01 NOTE — Progress Notes (Signed)
Primary Care Physician:  Lilyan Punt, MD  Primary Gastroenterologist:  Roetta Sessions, MD   Chief Complaint  Patient presents with  . ?parasites    HPI:  Melinda Moore is a 42 y.o. female here for further evaluation of ?parasites in her stool. She has noted three occasions where she has passed a small bluish/white round ball in the toilet and or on the stool. Second time she took specimen to Dr. Lilyan Punt who sent it off to be analyzed. Per patient, reported to be food debris. She is worried that she may have a parasite.   GI symptoms consist of belching but no heartburn. Difficulty initiation of swallowing with foods/pills but no delay once swallowing as occurred. She wonders if it is related to her MS. Infrequent vomiting, if overeats and then lays down. No abdominal pain. Stays constipated. Takes stool softners off/on but do not work well. Few years ago, tried Amitiza but caused griping. Metamucil, Miralax did not seem to help. Vegetable laxative not helpful. Some brbpr with straining, once without straining. No melena.   C/O weight gain. Weight is up 25 pounds since 04/2010 when we last saw her. She reports being less active the last two years. She c/o lower extremity edema. Diagnosed with left lower DVT 02/2012 and had been on coumadin up until 08/2012 when it was held by Dellis Anes, PA-C. She has since had hypercoagulability labs but she has not received results. She has f/u appt next week. She developed right leg swelling last week but doppler was negative for DVT.   Patient is currently not taking Coumadin.   Current Outpatient Prescriptions  Medication Sig Dispense Refill  . amitriptyline (ELAVIL) 25 MG tablet Take 75 mg by mouth at bedtime.      . bumetanide (BUMEX) 1 MG tablet Take 1 mg by mouth daily. 2 qam and I at noon      . carbamazepine (TEGRETOL XR) 200 MG 12 hr tablet Take 200 mg by mouth 2 (two) times daily.      . diazepam (VALIUM) 5 MG tablet Take 5 mg by mouth daily as  needed. For anxiety       . Fingolimod HCl (GILENYA) 0.5 MG CAPS Take 1 capsule by mouth daily.        Marland Kitchen gabapentin (NEURONTIN) 100 MG capsule TAKE 2 CAPSULES IN THE MORNING, THEN 2 CAPSULES AT NOON, THE 2 CAPSULES AT SUPPER, THEN 3 CAPSULES AT BEDTIME  30 capsule  0  . HYDROmorphone (DILAUDID) 4 MG tablet Take 1 tablet (4 mg total) by mouth 2 (two) times daily. One bid prn  60 tablet  0  . Magnesium Salicylate Tetrahyd (DOANS EXTRA STRENGTH) 580 MG TABS Take 2 tablets by mouth daily.      . Multiple Vitamin (MULTIVITAMIN) tablet Take 1 tablet by mouth daily.      . nabumetone (RELAFEN) 500 MG tablet Take 500 mg by mouth 2 (two) times daily.      Marland Kitchen OVER THE COUNTER MEDICATION 1 tablet. Backaid Max Maximum Strength for back pain      . Polyethylene Glycol 400 (BLINK TEARS) 0.25 % SOLN Apply 1 drop to eye at bedtime.        . potassium chloride (K-DUR) 10 MEQ tablet Take 10 mEq by mouth daily.      . promethazine (PHENERGAN) 25 MG suppository One pr q 4-6 hrs prn nausea  12 each  0  . tamsulosin (FLOMAX) 0.4 MG CAPS Take 0.4 mg by mouth as  needed.      . venlafaxine XR (EFFEXOR-XR) 75 MG 24 hr capsule 225 mg at bedtime.       . verapamil (CALAN) 120 MG tablet Take 120 mg by mouth 3 (three) times daily.        . Vitamins A & D (VITAMIN A & D PO) Take 4 tablets by mouth daily. Vit A 5000 IU and Vit D 400 IU x 4 tablets daily      . Vitamins C E (VITAMIN C & E COMBINATION) 500-400 MG-UNIT CAPS Take 2 tablets by mouth.      . warfarin (COUMADIN) 5 MG tablet Take 5 mg by mouth daily. Take as directed       No current facility-administered medications for this visit.    Allergies as of 10/01/2012 - Review Complete 10/01/2012  Allergen Reaction Noted  . Oxycodone Itching 02/18/2011  . Tape Itching and Rash 03/26/2011    Past Medical History  Diagnosis Date  . Bell's palsy   . MS (multiple sclerosis) 1997  . DVT (deep venous thrombosis) 09/02/2012    [place] on 02/13/2012 showed acute DVT in left calf  veins and posterior tibial veins  . Anemia   . Leukopenia   . Anxiety   . Depression   . Peripheral edema   . HTN (hypertension)     Past Surgical History  Procedure Laterality Date  . Abdominal hysterectomy    . Cholecystectomy    . Tubal ligation  2001  . Esophagogastroduodenoscopy  2011    Dr. Jena Gauss. Possible cervical esophageal with status post disruption with passage of Maloney dilator, glycol esophageal erythema, antral erosions. Biopsy from the stomach revealed minimal chronic inactive inflammation but negative for H. pylori    Family History  Problem Relation Age of Onset  . Heart disease Father   . Hyperlipidemia Father   . Hypertension Sister   . Other Paternal Uncle     Possible colon cancer, age greater than 38  . Cirrhosis Brother     etoh    History   Social History  . Marital Status: Married    Spouse Name: N/A    Number of Children: N/A  . Years of Education: N/A   Occupational History  . Not on file.   Social History Main Topics  . Smoking status: Never Smoker   . Smokeless tobacco: Not on file  . Alcohol Use: No  . Drug Use: No  . Sexually Active: Not on file   Other Topics Concern  . Not on file   Social History Narrative  . No narrative on file      ROS:  General: Negative for anorexia, weight loss, fever, chills. C/O fatigue, weakness. Eyes: Negative for vision changes.  ENT: Negative for hoarseness, nasal congestion. See HPI. CV: Negative for chest pain, angina, palpitations, dyspnea on exertion, peripheral edema.  Respiratory: Negative for dyspnea at rest, dyspnea on exertion, cough, sputum, wheezing.  GI: See history of present illness. GU:  Negative for dysuria, hematuria, urinary incontinence, urinary frequency, nocturnal urination.  MS: Negative for joint pain, low back pain.  Derm: Negative for rash or itching.  Neuro: Negative for weakness, abnormal sensation, seizure, frequent headaches, memory loss, confusion.  Psych:  Negative for anxiety, depression, suicidal ideation, hallucinations.  Endo: Negative for unusual weight change. See hpi. Heme: Negative for bruising or bleeding. Allergy: Negative for rash or hives.    Physical Examination:  BP 117/79  Pulse 112  Temp(Src) 98.2 F (36.8  C) (Oral)  Ht 5\' 10"  (1.778 m)  Wt 315 lb 12.8 oz (143.246 kg)  BMI 45.31 kg/m2   General: Well-nourished, well-developed in no acute distress. Accompanied by spouse.  Head: Normocephalic, atraumatic.   Eyes: Conjunctiva pink, no icterus. Mouth: Oropharyngeal mucosa moist and pink , no lesions erythema or exudate. Neck: Supple without thyromegaly, masses, or lymphadenopathy.  Lungs: Clear to auscultation bilaterally.  Heart: Regular rate and rhythm, no murmurs rubs or gallops.  Abdomen: Bowel sounds are normal, nontender, nondistended, no hepatosplenomegaly or masses, no abdominal bruits or    hernia , no rebound or guarding.   Rectal: defer Extremities: Trace lower extremity edema. No clubbing or deformities.  Neuro: Alert and oriented x 4 , grossly normal neurologically.  Skin: Warm and dry, no rash or jaundice.   Psych: Alert and cooperative, normal mood and affect.  Labs: Lab Results  Component Value Date   WBC 2.7* 09/24/2012   HGB 10.6* 09/24/2012   HCT 32.9* 09/24/2012   MCV 80.8 09/24/2012   PLT 339 09/24/2012   Hemoglobin typically in the low 11 range dating all the way back at least until January 2009. Leukopenia dating back to at least 2009  Lab Results  Component Value Date   CREATININE 0.63 09/24/2012   BUN 6 09/24/2012   NA 139 09/24/2012   K 3.3* 09/24/2012   CL 102 09/24/2012   CO2 27 09/24/2012   Lab Results  Component Value Date   ALT 18 09/24/2012   AST 18 09/24/2012   ALKPHOS 193* 09/24/2012   BILITOT 0.2* 09/24/2012   Alkaline phosphatase of 172 back in October 2012   Imaging Studies: US Venous Img Lower Unilateral Right  09/17/2012  *RADIOLOGY REPORT*  Clinical Data: Right lower  extremity pain in popliteal region, prior DVT  RIGHT LOWER EXTREMITY VENOUS DUPLEX ULTRASOUND  Technique:  Gray-scale sonography with graded compression, as well as color Doppler and duplex ultrasound, were performed to evaluate the deep venous system of the lower extremity from the level of the common femoral vein through the popliteal and proximal calf veins. Spectral Doppler was utilized to evaluate flow at rest and with distal augmentation maneuvers.  Comparison:  None.  Findings: The visualized right lower extremity deep venous system appears patent.  Normal compressibility.  Patent color Doppler flow.  Satisfactory spectral Doppler with respiratory variation and response to augmentation.  The greater saphenous vein, where visualized, is patent and compressible.  IMPRESSION: No deep venous thrombosis in the visualized right lower extremity.   Original Report Authenticated By: Charline Bills, M.D.

## 2012-10-01 NOTE — Progress Notes (Signed)
Cc PCP 

## 2012-10-01 NOTE — Patient Instructions (Addendum)
I will discuss with Dr. Jena Gauss whether you need to have a colonoscopy for chronic anemia and rectal bleeding. I will review records from Dr. Thornton Papas office when available. Start Linzess 290 mcg once daily on empty stomach for constipation. #10 samples provided. Call for prescription if works for you. We can adjust dose in needed as well. You may need to have additional labs for abnormal alk phos. I will review your medications to see if any are the culprit.

## 2012-10-07 ENCOUNTER — Encounter (HOSPITAL_BASED_OUTPATIENT_CLINIC_OR_DEPARTMENT_OTHER): Payer: 59 | Admitting: Oncology

## 2012-10-07 ENCOUNTER — Encounter (HOSPITAL_COMMUNITY): Payer: Self-pay

## 2012-10-07 VITALS — BP 136/84 | HR 100 | Temp 97.7°F | Resp 18 | Wt 318.8 lb

## 2012-10-07 DIAGNOSIS — R760 Raised antibody titer: Secondary | ICD-10-CM

## 2012-10-07 DIAGNOSIS — G35 Multiple sclerosis: Secondary | ICD-10-CM

## 2012-10-07 DIAGNOSIS — D72819 Decreased white blood cell count, unspecified: Secondary | ICD-10-CM

## 2012-10-07 DIAGNOSIS — I82409 Acute embolism and thrombosis of unspecified deep veins of unspecified lower extremity: Secondary | ICD-10-CM | POA: Diagnosis not present

## 2012-10-07 DIAGNOSIS — I82402 Acute embolism and thrombosis of unspecified deep veins of left lower extremity: Secondary | ICD-10-CM

## 2012-10-07 DIAGNOSIS — R894 Abnormal immunological findings in specimens from other organs, systems and tissues: Secondary | ICD-10-CM

## 2012-10-07 MED ORDER — RIVAROXABAN 20 MG PO TABS: 20.0000 mg | ORAL_TABLET | Freq: Every day | ORAL | Status: DC

## 2012-10-07 NOTE — Patient Instructions (Addendum)
[facility] Discharge Instructions  RECOMMENDATIONS MADE BY THE CONSULTANT AND ANY TEST RESULTS WILL BE SENT TO YOUR REFERRING PHYSICIAN.  EXAM FINDINGS BY THE PHYSICIAN TODAY AND SIGNS OR SYMPTOMS TO REPORT TO CLINIC OR PRIMARY PHYSICIAN: You are still at risk for future blood clots so we want you to continue to see [place] and to be seen By Dr. Marcheta Grammes at [facility].    MEDICATIONS PRESCRIBED:  Xarelto 20mg  Daily.  Please pick this up at your pharmacy.    SPECIAL INSTRUCTIONS/FOLLOW-UP: Please return to see [place] in 3 months.  See the front desk as you leave for your follow up appointment.  Please call us sooner if you need any further assistance.    Thank you for choosing [facility] to provide your oncology and hematology care.  To afford each patient quality time with our providers, please arrive at least 15 minutes before your scheduled appointment time.  With your help, our goal is to use those 15 minutes to complete the necessary work-up to ensure our physicians have the information they need to help with your evaluation and healthcare recommendations.    Effective January 1st, 2014, we ask that you re-schedule your appointment with our physicians should you arrive 10 or more minutes late for your appointment.  We strive to give you quality time with our providers, and arriving late affects you and other patients whose appointments are after yours.    Again, thank you for choosing [facility].  Our hope is that these requests will decrease the amount of time that you wait before being seen by our physicians.       _____________________________________________________________  Should you have questions after your visit to [facility], please contact our office at [phone] between the hours of 8:30 a.m. and 5:00 p.m.  Voicemails left after 4:30 p.m. will not be returned until the following business day.  For  prescription refill requests, have your pharmacy contact our office with your prescription refill request.

## 2012-10-07 NOTE — Progress Notes (Signed)
#  1 DVT of the left leg in the setting of multiple sclerosis. She has a very sedentary existence because of her disease times many years. 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  After a long discussion I feel it is safest to put her on xarelto long-term 20 mg a day. However for confirmation of this strategy I would like her to see Dr. Isaiah Serge or Dr Marcheta Grammes at [facility] for a second opinion.  Her husband was with her today and in talking to both of them her sedentary existence is very very significant. She gets up from the couch to do very little walking or activity. Therefore I think it may be best to decrease her chance of recurrent disease which I think are at least 20% or more with this clinical setting.  We will see what the people at [facility] say anticoagulation clinic. I myself we'll see her in 3 months  She also wanted to talk about the leukopenia which I don't think is clinically significant presently but is related to her medications. Her hemoglobin is only stable and her platelets are fine. She does not have an increased number of infections.

## 2012-10-08 ENCOUNTER — Telehealth (HOSPITAL_COMMUNITY): Payer: Self-pay | Admitting: *Deleted

## 2012-10-08 NOTE — Telephone Encounter (Signed)
Melinda Moore has an appointment with Dr. Isaiah Serge at [facility] coagulation clinic in June 2014.  She set the time and date up herself when it was convenient to go for her. James the [place] from the clinic just called and let me know this.

## 2012-10-09 ENCOUNTER — Ambulatory Visit: Payer: Medicare Other | Admitting: Gastroenterology

## 2012-10-14 ENCOUNTER — Encounter (HOSPITAL_COMMUNITY): Payer: Medicare Other

## 2012-10-15 NOTE — Progress Notes (Signed)
Please let the patient know I discussed her case with Dr. Jena Gauss. Please let her know the following.   -He recommends colonoscopy given rectal bleeding, age, and likely need for chronic Xarelto/anticoagulation as recently  determined by Dr. Mariel Sleet.    A. Please schedule if agreeable. Better to do before she starts chronic anticoagulation. Please see if she is   currently on coumadin or Xarelto or other anticoagulation/blood thinners. Please let me know if on any of these.   B. Needs to be done in OR secondary to polypharmacy.   -Patient also needs iron/TIBC/ferritin/AMA. Orders entered. Dx: elevated Alkphos, anemia.  -Patient needs abd u/s to look at biliary tree secondary to elevated AlkPhos.

## 2012-10-15 NOTE — Addendum Note (Signed)
Addended by: Tiffany Kocher on: 10/15/2012 10:58 AM   Modules accepted: Orders

## 2012-10-17 NOTE — Progress Notes (Signed)
Tried to call pt- LMOM 

## 2012-10-21 NOTE — Progress Notes (Signed)
Tried to call pt- LMOM 

## 2012-10-22 NOTE — Progress Notes (Signed)
Unable to reach pt- mailed letter.  

## 2012-10-28 ENCOUNTER — Telehealth: Payer: Self-pay | Admitting: *Deleted

## 2012-10-28 NOTE — Telephone Encounter (Signed)
Pt called to inform Rt leg swelling, "burning".  Stated called Dr. Leonette Most with stated symptoms, was advised by the office of Dr. Harrell Lark to be evaluated by the ED. Pt informed this office she did not want to go to the ED would rather come to this office today, pt was advised by this nurse to go to the ED for evaluation because of history of blood clots. Patient stated ok.

## 2012-10-28 NOTE — Telephone Encounter (Signed)
i agree. 

## 2012-10-29 ENCOUNTER — Telehealth: Payer: Self-pay | Admitting: Family Medicine

## 2012-10-29 NOTE — Telephone Encounter (Signed)
Ok times one, write and I'll sign

## 2012-10-29 NOTE — Telephone Encounter (Signed)
Patient needs a refill of hydromorphone 4mg 

## 2012-10-29 NOTE — Telephone Encounter (Signed)
Last script written for hydromorphone was on 09/05/12

## 2012-10-30 NOTE — Telephone Encounter (Signed)
RX signed and ready for pick up. Left message on voicemail notifying patient.

## 2012-11-05 ENCOUNTER — Encounter (HOSPITAL_COMMUNITY): Payer: Medicare Other

## 2012-11-05 ENCOUNTER — Telehealth: Payer: Self-pay | Admitting: *Deleted

## 2012-11-05 NOTE — Telephone Encounter (Signed)
Similar medicine is Demadex 20 mg. Take 2 in am , 1 @ noon if needed. #90 2 refills. If she goes back to bumex then DO not use both. Keep reg fu every 3 months

## 2012-11-05 NOTE — Telephone Encounter (Signed)
Called in medication to [place] at [facility] [facility]. He stated that he will notify patient when med is ready and explain precautions.

## 2012-11-05 NOTE — Telephone Encounter (Signed)
Pharm called stated Bumex is not available at this time. Can it be changed to something different.

## 2012-11-07 ENCOUNTER — Other Ambulatory Visit: Payer: Self-pay | Admitting: *Deleted

## 2012-11-07 DIAGNOSIS — Z7901 Long term (current) use of anticoagulants: Secondary | ICD-10-CM

## 2012-11-13 DIAGNOSIS — F329 Major depressive disorder, single episode, unspecified: Secondary | ICD-10-CM | POA: Diagnosis not present

## 2012-11-13 DIAGNOSIS — Z7901 Long term (current) use of anticoagulants: Secondary | ICD-10-CM | POA: Diagnosis not present

## 2012-11-13 DIAGNOSIS — G35 Multiple sclerosis: Secondary | ICD-10-CM | POA: Diagnosis not present

## 2012-11-13 DIAGNOSIS — I82409 Acute embolism and thrombosis of unspecified deep veins of unspecified lower extremity: Secondary | ICD-10-CM | POA: Diagnosis not present

## 2012-11-13 DIAGNOSIS — I10 Essential (primary) hypertension: Secondary | ICD-10-CM | POA: Diagnosis not present

## 2012-11-13 DIAGNOSIS — Z87891 Personal history of nicotine dependence: Secondary | ICD-10-CM | POA: Diagnosis not present

## 2012-11-13 DIAGNOSIS — G8929 Other chronic pain: Secondary | ICD-10-CM | POA: Diagnosis not present

## 2012-11-13 DIAGNOSIS — R609 Edema, unspecified: Secondary | ICD-10-CM | POA: Diagnosis not present

## 2012-11-13 DIAGNOSIS — R5381 Other malaise: Secondary | ICD-10-CM | POA: Diagnosis not present

## 2012-11-13 DIAGNOSIS — Z79899 Other long term (current) drug therapy: Secondary | ICD-10-CM | POA: Diagnosis not present

## 2012-11-19 ENCOUNTER — Other Ambulatory Visit: Payer: Self-pay | Admitting: Family Medicine

## 2012-12-25 ENCOUNTER — Telehealth: Payer: Self-pay | Admitting: Family Medicine

## 2012-12-25 NOTE — Telephone Encounter (Signed)
Dilaudid 4mg  numb sixty one bid prn

## 2012-12-25 NOTE — Telephone Encounter (Signed)
Patient needs refill on hydromorthone 4mg .She will pickup when ready.

## 2012-12-26 ENCOUNTER — Other Ambulatory Visit: Payer: Self-pay | Admitting: *Deleted

## 2012-12-26 DIAGNOSIS — F411 Generalized anxiety disorder: Secondary | ICD-10-CM | POA: Diagnosis not present

## 2012-12-26 DIAGNOSIS — M549 Dorsalgia, unspecified: Secondary | ICD-10-CM | POA: Diagnosis not present

## 2012-12-26 DIAGNOSIS — M62838 Other muscle spasm: Secondary | ICD-10-CM | POA: Diagnosis not present

## 2012-12-26 DIAGNOSIS — G35 Multiple sclerosis: Secondary | ICD-10-CM | POA: Diagnosis not present

## 2012-12-26 MED ORDER — HYDROMORPHONE HCL 4 MG PO TABS: 4.0000 mg | ORAL_TABLET | Freq: Two times a day (BID) | ORAL | Status: DC

## 2012-12-26 NOTE — Telephone Encounter (Signed)
Script ready for pick up. Pt notified on her voicemail

## 2012-12-31 ENCOUNTER — Encounter (HOSPITAL_COMMUNITY): Payer: 59 | Attending: Hematology and Oncology

## 2012-12-31 ENCOUNTER — Encounter (HOSPITAL_COMMUNITY): Payer: Self-pay

## 2012-12-31 DIAGNOSIS — I82409 Acute embolism and thrombosis of unspecified deep veins of unspecified lower extremity: Secondary | ICD-10-CM

## 2012-12-31 DIAGNOSIS — Z452 Encounter for adjustment and management of vascular access device: Secondary | ICD-10-CM

## 2012-12-31 DIAGNOSIS — Z95828 Presence of other vascular implants and grafts: Secondary | ICD-10-CM | POA: Insufficient documentation

## 2012-12-31 HISTORY — DX: Presence of other vascular implants and grafts: Z95.828

## 2012-12-31 MED ORDER — HEPARIN SOD (PORK) LOCK FLUSH 100 UNIT/ML IV SOLN
500.0000 [IU] | Freq: Once | INTRAVENOUS | Status: AC
  Administered 2012-12-31: 500 [IU] via INTRAVENOUS
  Filled 2012-12-31: qty 5

## 2012-12-31 MED ORDER — SODIUM CHLORIDE 0.9 % IJ SOLN
10.0000 mL | INTRAMUSCULAR | Status: DC | PRN
  Administered 2012-12-31: 10 mL via INTRAVENOUS
  Filled 2012-12-31: qty 10

## 2012-12-31 MED ORDER — HEPARIN SOD (PORK) LOCK FLUSH 100 UNIT/ML IV SOLN
INTRAVENOUS | Status: AC
  Filled 2012-12-31: qty 5

## 2012-12-31 NOTE — Progress Notes (Signed)
Melinda Moore presented for Portacath access and flush. Proper placement of portacath confirmed by CXR. Portacath located mid chest wall accessed with  H 20 needle. Good blood return present. Portacath flushed with 20ml NS and 500U/5ml Heparin and needle removed intact. Procedure without incident. Patient tolerated procedure well.   

## 2013-01-06 ENCOUNTER — Ambulatory Visit (HOSPITAL_COMMUNITY): Payer: Medicare Other

## 2013-01-14 ENCOUNTER — Ambulatory Visit (HOSPITAL_COMMUNITY): Payer: Medicare Other

## 2013-01-15 ENCOUNTER — Ambulatory Visit (HOSPITAL_COMMUNITY): Payer: Medicare Other

## 2013-01-19 ENCOUNTER — Ambulatory Visit (HOSPITAL_COMMUNITY): Payer: Medicare Other

## 2013-01-22 ENCOUNTER — Encounter (HOSPITAL_COMMUNITY): Payer: 59 | Attending: Hematology and Oncology

## 2013-01-22 ENCOUNTER — Encounter (HOSPITAL_COMMUNITY): Payer: Self-pay

## 2013-01-22 VITALS — BP 111/72 | HR 90 | Temp 97.5°F | Resp 16 | Wt 321.0 lb

## 2013-01-22 DIAGNOSIS — D649 Anemia, unspecified: Secondary | ICD-10-CM | POA: Insufficient documentation

## 2013-01-22 DIAGNOSIS — D72810 Lymphocytopenia: Secondary | ICD-10-CM

## 2013-01-22 DIAGNOSIS — Z452 Encounter for adjustment and management of vascular access device: Secondary | ICD-10-CM

## 2013-01-22 DIAGNOSIS — Z86718 Personal history of other venous thrombosis and embolism: Secondary | ICD-10-CM

## 2013-01-22 LAB — CBC
MCH: 26.1 pg (ref 26.0–34.0)
MCHC: 31.8 g/dL (ref 30.0–36.0)
MCV: 82.1 fL (ref 78.0–100.0)
Platelets: 347 10*3/uL (ref 150–400)
RBC: 4.25 MIL/uL (ref 3.87–5.11)
RDW: 16.4 % — ABNORMAL HIGH (ref 11.5–15.5)

## 2013-01-22 MED ORDER — SODIUM CHLORIDE 0.9 % IJ SOLN
10.0000 mL | INTRAMUSCULAR | Status: DC | PRN
  Administered 2013-01-22: 10 mL via INTRAVENOUS
  Filled 2013-01-22: qty 10

## 2013-01-22 MED ORDER — HEPARIN SOD (PORK) LOCK FLUSH 100 UNIT/ML IV SOLN
INTRAVENOUS | Status: AC
  Filled 2013-01-22: qty 5

## 2013-01-22 MED ORDER — HEPARIN SOD (PORK) LOCK FLUSH 100 UNIT/ML IV SOLN
500.0000 [IU] | Freq: Once | INTRAVENOUS | Status: AC
  Administered 2013-01-22: 500 [IU] via INTRAVENOUS
  Filled 2013-01-22: qty 5

## 2013-01-22 NOTE — Patient Instructions (Addendum)
[facility] Discharge Instructions  RECOMMENDATIONS MADE BY THE CONSULTANT AND ANY TEST RESULTS WILL BE SENT TO YOUR REFERRING PHYSICIAN.  EXAM FINDINGS BY THE PHYSICIAN TODAY AND SIGNS OR SYMPTOMS TO REPORT TO CLINIC OR PRIMARY PHYSICIAN: Discussion by Dr. Lorre Nick.  Will check some blood work today and see you back in 1 week to go over results.  MEDICATIONS PRESCRIBED:  none  INSTRUCTIONS GIVEN AND DISCUSSED: Report unusual swelling of extremities, shortness of breath etc.  SPECIAL INSTRUCTIONS/FOLLOW-UP: Follow-up in 1 week.  Thank you for choosing [facility] to provide your oncology and hematology care.  To afford each patient quality time with our providers, please arrive at least 15 minutes before your scheduled appointment time.  With your help, our goal is to use those 15 minutes to complete the necessary work-up to ensure our physicians have the information they need to help with your evaluation and healthcare recommendations.    Effective January 1st, 2014, we ask that you re-schedule your appointment with our physicians should you arrive 10 or more minutes late for your appointment.  We strive to give you quality time with our providers, and arriving late affects you and other patients whose appointments are after yours.    Again, thank you for choosing [facility].  Our hope is that these requests will decrease the amount of time that you wait before being seen by our physicians.       _____________________________________________________________  Should you have questions after your visit to [facility], please contact our office at [phone] between the hours of 8:30 a.m. and 5:00 p.m.  Voicemails left after 4:30 p.m. will not be returned until the following business day.  For prescription refill requests, have your pharmacy contact our office with your prescription refill request.

## 2013-01-22 NOTE — Progress Notes (Signed)
[facility] OFFICE PROGRESS NOTE  PCP: Lilyan Punt, MD [address] [place] [address]  DIAGNOSIS: ANEMIA - Plan: CBC, Iron and TIBC, Ferritin, TSH, Cortisol, CBC, Iron and TIBC, Ferritin, TSH, Cortisol, heparin lock flush 100 unit/mL, sodium chloride 0.9 % injection 10 mL  WT GAIN. LYMPHOPENIA. HX DVT MILD ELEVATED ALK PHOS, RECENTLY 153 FROM GI, NOTE 172 IN 2012  CURRENT THERAPY: NONE  INTERVAL HISTORY: Melinda Moore 42 y.o. female returns for followup with prior history of DVT. Since last visit, patient had a consultation, on 11/13/12 at [facility] coagulation clinic. Opinion from Dr.moll documented in at [facility] consultation note dated June 5 and cosigned June 10. Recommendation was made to stop anticoagulation which was done at that time. If I said if patient takes a long plane ride greater than 8 hours that she should DVT prophylaxis. Patient should be aware of the signs and symptoms of DVT. Patient was also told that she could take low-dose aspirin 81 mg, from the patient this was not to seen in the consultation note. Patient no acute complaints. No fever chills or sweats no abdominal pain. Patient feels diffuse and well she has some diffuse aching pain that she says is manifestation of MS  MEDICAL HISTORY: Past Medical History  Diagnosis Date  . Bell's palsy   . MS (multiple sclerosis) 1997  . DVT (deep venous thrombosis) 09/02/2012    [place] on 02/13/2012 showed acute DVT in left calf veins and posterior tibial veins  . Anemia   . Leukopenia   . Anxiety   . Depression   . Peripheral edema   . HTN (hypertension)   . Port catheter in place 12/31/2012    SURGICAL HISTORY:  Past Surgical History  Procedure Laterality Date  . Abdominal hysterectomy    . Cholecystectomy    . Tubal ligation  2001  . Esophagogastroduodenoscopy  2011    Dr. Jena Gauss. Possible cervical esophageal with status post disruption with passage of Maloney dilator, glycol esophageal erythema,  antral erosions. Biopsy from the stomach revealed minimal chronic inactive inflammation but negative for H. pylori      PROBLEM LIST  :see pmh     ALLERGIES:  is allergic to oxycodone and tape.  MEDICATIONS: See medication list no the patient is not taking Relafen orXaralto   REVIEW OF SYSTEMS:  Denies fever chills sweats headache shortness of breath no lower extremity swelling. On questioning has had overtime scant amounts of low to on stools related to constipation but no other rectal bleeding or bruising noted history of intermittent rectal bleeding and a prior GI evaluation is prior discussed possible gastric bypass surgery for weight loss with GI  PHYSICAL EXAMINATION: ECOG PERFORMANCE STATUS: 1 - Symptomatic but completely ambulatory  Filed Vitals:   01/22/13 1434  BP: 111/72  Pulse: 90  Temp: 97.5 F (36.4 C)  Resp: 16    GENERAL: No distress, well nourished.  SKIN:  No rashes or bruising  HEAD: Normocephalic, No trauma EYES: Sclera and conjuntiva clear  ENT: No thrush LYMPH: No palpable lymphadenopathy neck, supraclavicular submandibular axilla BREAST: Not examined  LUNGS: Clear to auscultation, no crackles or wheezes or rhonchi HEART: Regular rate & rhythm,   ABDOMEN: Abdomen soft, non-tender, , no masses or organomegaly , obese  EXTREMITIES: No lower ext edema NEURO: Alert & oriented, no gross focal weakness. PSYCH  Cooperative, mood/affect normal   LABORATORY DATA: Results for orders placed in visit on 01/22/13 (from the past  48 hour(s))  CBC     Status: Abnormal   Collection Time    01/22/13  4:00 PM      Result Value Range   WBC 3.7 (*) 4.0 - 10.5 K/uL   RBC 4.25  3.87 - 5.11 MIL/uL   Hemoglobin 11.1 (*) 12.0 - 15.0 g/dL   HCT 16.1 (*) 09.6 - 04.5 %   MCV 82.1  78.0 - 100.0 fL   MCH 26.1  26.0 - 34.0 pg   MCHC 31.8  30.0 - 36.0 g/dL   RDW 40.9 (*) 81.1 - 91.4 %   Platelets 347  150 - 400 K/uL      RADIOGRAPHIC STUDIES: No results  found.   ASSESSMENT: 1. With regard to prior DVT, prior hypercoagulable workup was not significant, patient is taken a second opinion with [facility] coagulation clinic, advised no further workup, advise stop anticoagulation which she has done since June. Advised that she could take 81 mg aspirin and patient feels more comfortable doing that. Advised to take Lovenox prophylaxis if you have take longer than a plane trip. Advised beware the signs and symptoms of DVT. She is aware to always maintain mobility particularly in a car or other situations where she might be sitting for a long time 2. Patient has mild anemia. She was prior recommended by GI and iron studies and for colonoscopy she never followed through. I have recommended to recheck the CBC and iron studies  with a history of some blood although more likely this is hemorrhoidal blood, with a combination of factors, in the patient who is post hysterectomy years ago, it would be appropriate to get back to GI. 3. Patient is mild lymphopenia. Likely due to MS medication, as apparently 4% incidence of lymphopenia, I advised the patient would be prudent to have HIV is the other significant possible cause of lymphopenia, patient declined  4. Weight gain. Patient says she's had a steady weight gain over time. She does not think she eats a lot of, but she does have limited mobility. She agreed that she'll work with her primary care physician regarding dietary advice and weight loss strategies. I agreed to check a TSH and a sister free cortisol as an initial look for any biochemical issues. 5. History of elevated alkaline phosphatase, results from neurology in June/18/14 alkaline phosphatase 153 is only mildly elevated lower than results from 2012 likely benign can be reevaluated by GI   PLAN: CBC, iron studies and ferritin, TSH, random result level were checked. Discussed a GI referral with the patient. She has stopped anticoagulation. A followup  appointment was made for one week to review the outstanding studies and then get her referred on to GI and back to primary care, she will also continue to follow with neurology. Likely she could be released from cancer Center followup after that visit with nothing active from hematology/oncology point of view   All questions were answered. The patient knows to call the clinic with any problems, questions or concerns. We can certainly see the patient much sooner if necessary.     Marin Roberts, MD 01/22/2013 6:39 PM

## 2013-01-22 NOTE — Progress Notes (Signed)
Melinda Moore presented for Portacath access and flush. Proper placement of portacath confirmed by CXR. Portacath located mid chest wall accessed with  H 20 needle. Good blood return present. Portacath flushed with 20ml NS and 500U/5ml Heparin and needle removed intact. Procedure without incident. Patient tolerated procedure well.   

## 2013-01-23 LAB — CORTISOL: Cortisol, Plasma: 6.7 ug/dL

## 2013-01-23 LAB — IRON AND TIBC
Iron: 34 ug/dL — ABNORMAL LOW (ref 42–135)
UIBC: 307 ug/dL (ref 125–400)

## 2013-01-26 ENCOUNTER — Encounter (HOSPITAL_COMMUNITY): Payer: Self-pay

## 2013-01-29 ENCOUNTER — Ambulatory Visit (HOSPITAL_COMMUNITY): Payer: Medicare Other

## 2013-02-02 ENCOUNTER — Ambulatory Visit: Payer: Self-pay | Admitting: Family Medicine

## 2013-02-03 ENCOUNTER — Encounter: Payer: Self-pay | Admitting: Family Medicine

## 2013-02-03 ENCOUNTER — Ambulatory Visit (INDEPENDENT_AMBULATORY_CARE_PROVIDER_SITE_OTHER): Payer: 59 | Admitting: Family Medicine

## 2013-02-03 VITALS — BP 128/78 | Temp 98.6°F | Ht 70.0 in | Wt 325.4 lb

## 2013-02-03 DIAGNOSIS — L910 Hypertrophic scar: Secondary | ICD-10-CM

## 2013-02-03 MED ORDER — TRIAMCINOLONE ACETONIDE 0.1 % EX CREA: TOPICAL_CREAM | Freq: Two times a day (BID) | CUTANEOUS | Status: DC

## 2013-02-03 NOTE — Progress Notes (Signed)
  Subjective:    Patient ID: Melinda Moore, female    DOB: 08-Mar-1971, 42 y.o.   MRN: [id number]  HPI Patient here today for skin tag to left ear. Noticed it 6 months ago, and it progressively got larger in size.   Believes she has eczema to right hand.   No other concerns.   Past medical history chronic pain neuropathy multiple sclerosis  Review of Systems See above    Objective:   Physical Exam On physical exam this patient has a large keloid on her left outer ear. It measures approximately 1 inch by half inch by 1/3 inch. It has a broad base attachment to the ear. Patient denies any other particular troubles currently.       Assessment & Plan:  Large keloid-referral to specialist. I believe this patient most likely will need either dermatology surgical center or ear nose throat. I would first start off with dermatology but [facility], if they feel they cannot help her then utilized ENT.  Patient also with eczema on the hand Kenalog cream twice a day when necessary.

## 2013-02-03 NOTE — Patient Instructions (Signed)
Avoid starches/sugars  Minimize breads  Increase walking  Use water or diet drinks

## 2013-02-04 ENCOUNTER — Other Ambulatory Visit: Payer: Self-pay | Admitting: Family Medicine

## 2013-02-05 NOTE — Telephone Encounter (Signed)
Please confirm with patient how taking then may have 30 day with 5 refills

## 2013-02-05 NOTE — Telephone Encounter (Signed)
Patient states that she is taking this medication as prescribed.

## 2013-02-10 ENCOUNTER — Encounter (HOSPITAL_COMMUNITY): Payer: Self-pay

## 2013-02-18 ENCOUNTER — Encounter (HOSPITAL_COMMUNITY): Payer: Medicare Other

## 2013-02-25 ENCOUNTER — Telehealth: Payer: Self-pay | Admitting: Family Medicine

## 2013-02-25 NOTE — Telephone Encounter (Signed)
[place] to see 

## 2013-02-25 NOTE — Telephone Encounter (Signed)
Pt last seen for keloid. Please explain to pt that dilaudid is schedule 2 drug. We are governed by law to have a chronic pain visit every 3 months to responsibly show we are not just writing scripts. She can have a script for 30 and schedule an OV within next 10 to 14 days for pain managenment

## 2013-02-25 NOTE — Telephone Encounter (Signed)
Patient got dilaudid 4mg  # 60 on 01/25/13

## 2013-02-25 NOTE — Telephone Encounter (Signed)
Patient needs a prescription for hydromorphone 4mg  refilled. Please call [phone] when prescription is ready.

## 2013-02-26 NOTE — Telephone Encounter (Signed)
Patient scheduled office visit for next week and stated she had enough meds till the visit.

## 2013-03-03 ENCOUNTER — Ambulatory Visit: Payer: 59 | Admitting: Family Medicine

## 2013-03-04 ENCOUNTER — Ambulatory Visit: Payer: 59 | Admitting: Family Medicine

## 2013-03-05 ENCOUNTER — Ambulatory Visit: Payer: 59 | Admitting: Family Medicine

## 2013-03-05 ENCOUNTER — Encounter (HOSPITAL_COMMUNITY): Payer: Medicare Other

## 2013-03-11 ENCOUNTER — Ambulatory Visit: Payer: 59 | Admitting: Family Medicine

## 2013-03-11 ENCOUNTER — Ambulatory Visit (INDEPENDENT_AMBULATORY_CARE_PROVIDER_SITE_OTHER): Payer: 59 | Admitting: Family Medicine

## 2013-03-11 ENCOUNTER — Encounter: Payer: Self-pay | Admitting: Family Medicine

## 2013-03-11 ENCOUNTER — Encounter (HOSPITAL_COMMUNITY): Payer: Medicare Other

## 2013-03-11 VITALS — BP 152/80 | Ht 70.0 in | Wt 291.0 lb

## 2013-03-11 DIAGNOSIS — G894 Chronic pain syndrome: Secondary | ICD-10-CM

## 2013-03-11 DIAGNOSIS — R5381 Other malaise: Secondary | ICD-10-CM

## 2013-03-11 DIAGNOSIS — G35 Multiple sclerosis: Secondary | ICD-10-CM

## 2013-03-11 MED ORDER — METHYLPHENIDATE HCL ER (LA) 30 MG PO CP24: 30.0000 mg | ORAL_CAPSULE | Freq: Every day | ORAL | Status: DC

## 2013-03-11 MED ORDER — HYDROMORPHONE HCL 4 MG PO TABS: 4.0000 mg | ORAL_TABLET | Freq: Two times a day (BID) | ORAL | Status: DC

## 2013-03-11 NOTE — Progress Notes (Signed)
  Subjective:    Patient ID: Melinda Moore, female    DOB: 05/22/1971, 42 y.o.   MRN: [id number]  HPI Patient is here today for a pain management f/u. She does like the pain medications that are prescribed to her. She needs a refill on the pain meds.  Wants to discuss weight loss and something for energy.   This patient was seen today for chronic pain  The medication list was reviewed and updated.  Discussion was held with the patient regarding compliance with pain medication. The patient was advised the importance of maintaining medication and not using illegal substances with these. The patient was educated that we can provide 3 monthly scripts for their medication, it is their responsibility to follow the instructions. Discussion was held with the patient to make sure they're not having significant side effects. Patient is aware that pain medications are meant to minimize the severity of the pain to allow their pain levels to improve to allow for better function. They are aware of that pain medications cannot totally remove their pain.     Review of Systems Denies high fever chills sweats denies chest pain shortness of breath. She states severe fatigue and tiredness her neurologist uses Ritalin to help her with her    Objective:   Physical Exam For physical exam please see below       Assessment & Plan:  Neck no masses lungs are clear hearts regular pulse normal blood pressure on recheck was good 130/80 extremities no edema  #1 chronic leg pain associated with MS she may use Dilaudid 4 mg. No greater than 2 per day. Other pain medicines have not helped. Does not cause drowsiness. I encouraged patient to followup when she needs more refills. I gave her prescription for 60. She was given one additional prescription. She was warned not to use this as a sleep aid that could cause her to stop breathing and even cause her to die in her sleep.  #2 this patient is at risk for sleep apnea I  asked her to fill out a sleep questionnaire bring it back  #3 severe fatigue and tiredness related to MS Ritalin LA 30 mg 1 every morning followup again in 3 months time. If this medicine works she may call us back for refills. Once again followup in 3 months. 25 minutes spent with patient [id number].

## 2013-03-12 DIAGNOSIS — Z885 Allergy status to narcotic agent status: Secondary | ICD-10-CM | POA: Diagnosis not present

## 2013-03-12 DIAGNOSIS — L910 Hypertrophic scar: Secondary | ICD-10-CM | POA: Diagnosis not present

## 2013-03-12 DIAGNOSIS — Z8489 Family history of other specified conditions: Secondary | ICD-10-CM | POA: Diagnosis not present

## 2013-03-12 DIAGNOSIS — Z79899 Other long term (current) drug therapy: Secondary | ICD-10-CM | POA: Diagnosis not present

## 2013-03-12 DIAGNOSIS — E669 Obesity, unspecified: Secondary | ICD-10-CM | POA: Diagnosis not present

## 2013-03-12 DIAGNOSIS — G35 Multiple sclerosis: Secondary | ICD-10-CM | POA: Diagnosis not present

## 2013-03-12 DIAGNOSIS — Z6841 Body Mass Index (BMI) 40.0 and over, adult: Secondary | ICD-10-CM | POA: Diagnosis not present

## 2013-03-14 ENCOUNTER — Encounter: Payer: Self-pay | Admitting: Internal Medicine

## 2013-03-30 DIAGNOSIS — L910 Hypertrophic scar: Secondary | ICD-10-CM | POA: Diagnosis not present

## 2013-04-06 ENCOUNTER — Ambulatory Visit (INDEPENDENT_AMBULATORY_CARE_PROVIDER_SITE_OTHER): Payer: 59 | Admitting: Nurse Practitioner

## 2013-04-06 ENCOUNTER — Encounter: Payer: Self-pay | Admitting: Nurse Practitioner

## 2013-04-06 VITALS — BP 160/80 | Temp 98.4°F | Ht 70.0 in | Wt 323.0 lb

## 2013-04-06 DIAGNOSIS — Z23 Encounter for immunization: Secondary | ICD-10-CM

## 2013-04-06 DIAGNOSIS — N63 Unspecified lump in unspecified breast: Secondary | ICD-10-CM

## 2013-04-06 DIAGNOSIS — F329 Major depressive disorder, single episode, unspecified: Secondary | ICD-10-CM | POA: Diagnosis not present

## 2013-04-06 DIAGNOSIS — G35 Multiple sclerosis: Secondary | ICD-10-CM | POA: Diagnosis not present

## 2013-04-08 ENCOUNTER — Other Ambulatory Visit: Payer: Self-pay | Admitting: Nurse Practitioner

## 2013-04-08 ENCOUNTER — Encounter: Payer: Self-pay | Admitting: Nurse Practitioner

## 2013-04-08 ENCOUNTER — Ambulatory Visit: Payer: 59 | Admitting: Nurse Practitioner

## 2013-04-08 DIAGNOSIS — N63 Unspecified lump in unspecified breast: Secondary | ICD-10-CM | POA: Insufficient documentation

## 2013-04-08 NOTE — Progress Notes (Signed)
Subjective:  Presents for complaints of enlargement of the right breast for about a month. Patient waited to see if it would resolve on its own. Mildly tender. No fever. No drainage from the nipple. No family history of breast cancer.  Objective:   BP 160/80  Temp(Src) 98.4 F (36.9 C)  Ht 5\' 10"  (1.778 m)  Wt 323 lb (146.512 kg)  BMI 46.35 kg/m2 NAD. Alert, oriented. Lungs clear. Heart regular rate rhythm. Obvious enlargement of the right breast as compared to the left. Left breast exam no obvious masses, exhilarated no adenopathy. Right breast dense tissue noted along the lower part of the breast no dominant masses. Tissue is firmer as compared to the left breast. No drainage noted from the areola. Axilla no adenopathy.  Assessment:Swelling of breast - Plan: MM Digital Diagnostic Bilat, CANCELED: MM Digital Screening  Need for prophylactic vaccination and inoculation against influenza  Plan: Bilateral diagnostic mammogram scheduled. Warning signs reviewed. Patient to call back sooner if worse. Further followup based on mammogram report.

## 2013-04-10 ENCOUNTER — Other Ambulatory Visit: Payer: Self-pay | Admitting: Family Medicine

## 2013-04-10 ENCOUNTER — Telehealth: Payer: Self-pay | Admitting: Nurse Practitioner

## 2013-04-10 ENCOUNTER — Ambulatory Visit: Payer: 59 | Admitting: Nurse Practitioner

## 2013-04-10 NOTE — Telephone Encounter (Signed)
Notified patient Not yet. Sorry but we have been so busy. Should have an answer on Monday. Patient verbalized understanding.

## 2013-04-10 NOTE — Telephone Encounter (Signed)
Not yet. Sorry but we have been so busy. Should have an answer on Monday.

## 2013-04-10 NOTE — Telephone Encounter (Signed)
Patient is wanting to know if you had checked to see if patient could take apex?

## 2013-04-13 ENCOUNTER — Encounter: Payer: 59 | Admitting: Nurse Practitioner

## 2013-04-13 NOTE — Telephone Encounter (Signed)
This patient needs a simpler protocol. Currently she is taking 900 mg per day. Neurontin does come in a 300 mg capsule it could be taken 1 pill 3 times daily. Please discuss with patient how we would like to simplify the number of capsules she is taking. Apparently this was prescribed by a different doctor but now is been asked to be prescribed by [place]. Please verify with patient she is taking in the way the message said.

## 2013-04-15 ENCOUNTER — Encounter (HOSPITAL_COMMUNITY): Payer: 59 | Attending: Hematology and Oncology

## 2013-04-15 ENCOUNTER — Ambulatory Visit (HOSPITAL_COMMUNITY)
Admission: RE | Admit: 2013-04-15 | Discharge: 2013-04-15 | Disposition: A | Discharge status: Home / Self Care | Payer: 59 | Source: Ambulatory Visit | Attending: Nurse Practitioner | Admitting: Nurse Practitioner

## 2013-04-15 DIAGNOSIS — I82409 Acute embolism and thrombosis of unspecified deep veins of unspecified lower extremity: Secondary | ICD-10-CM

## 2013-04-15 DIAGNOSIS — Z9889 Other specified postprocedural states: Secondary | ICD-10-CM | POA: Insufficient documentation

## 2013-04-15 DIAGNOSIS — N644 Mastodynia: Secondary | ICD-10-CM | POA: Insufficient documentation

## 2013-04-15 DIAGNOSIS — N63 Unspecified lump in unspecified breast: Secondary | ICD-10-CM

## 2013-04-15 DIAGNOSIS — Z452 Encounter for adjustment and management of vascular access device: Secondary | ICD-10-CM

## 2013-04-15 DIAGNOSIS — Z95828 Presence of other vascular implants and grafts: Secondary | ICD-10-CM

## 2013-04-15 MED ORDER — HEPARIN SOD (PORK) LOCK FLUSH 100 UNIT/ML IV SOLN
INTRAVENOUS | Status: AC
  Filled 2013-04-15: qty 5

## 2013-04-15 MED ORDER — SODIUM CHLORIDE 0.9 % IJ SOLN
10.0000 mL | INTRAMUSCULAR | Status: DC | PRN
  Administered 2013-04-15: 10 mL via INTRAVENOUS

## 2013-04-15 MED ORDER — HEPARIN SOD (PORK) LOCK FLUSH 100 UNIT/ML IV SOLN
500.0000 [IU] | Freq: Once | INTRAVENOUS | Status: AC
  Administered 2013-04-15: 500 [IU] via INTRAVENOUS

## 2013-04-15 NOTE — Progress Notes (Signed)
Melinda Moore presented for Portacath access and flush. Proper placement of portacath confirmed by CXR. Portacath located mid chest wall accessed with  H 20 needle. Good blood return present. Portacath flushed with 20ml NS and 500U/5ml Heparin and needle removed intact. Procedure without incident. Patient tolerated procedure well.   

## 2013-04-17 ENCOUNTER — Telehealth: Payer: Self-pay | Admitting: *Deleted

## 2013-04-17 NOTE — Telephone Encounter (Signed)
Pt wanted to know if you have researched if she can take addipex. She states she talk to you about this at visit. And Dr. Lorin Picket has also prescribed this for her in the past.

## 2013-04-20 ENCOUNTER — Telehealth: Payer: Self-pay | Admitting: Nurse Practitioner

## 2013-04-20 NOTE — Telephone Encounter (Signed)
Patient wants to know if you have researched to see if patient could get diet aid that was mentioned?

## 2013-04-23 DIAGNOSIS — L910 Hypertrophic scar: Secondary | ICD-10-CM | POA: Diagnosis not present

## 2013-04-23 NOTE — Telephone Encounter (Signed)
Patient called to inform Eber Jones the she has taken the medication they discussed prior.

## 2013-04-24 ENCOUNTER — Other Ambulatory Visit: Payer: Self-pay | Admitting: Nurse Practitioner

## 2013-04-24 MED ORDER — PHENTERMINE HCL 37.5 MG PO TABS: 37.5000 mg | ORAL_TABLET | Freq: Every day | ORAL | Status: DC

## 2013-04-24 NOTE — Telephone Encounter (Signed)
Called patient to tell her the refill  Was done on Phentermine. Office visit in 3 months before more refills. Has taken fine in the past. She verbalized understanding.

## 2013-04-24 NOTE — Telephone Encounter (Signed)
Refill done on Phentermine. Office visit in 3 months before more refills. Has taken fine in the past.

## 2013-04-29 ENCOUNTER — Ambulatory Visit (HOSPITAL_COMMUNITY): Payer: Medicare Other | Admitting: Oncology

## 2013-04-29 NOTE — Progress Notes (Signed)
-  No show, letter sent-   

## 2013-06-10 ENCOUNTER — Encounter (HOSPITAL_COMMUNITY): Payer: Self-pay | Admitting: Oncology

## 2013-06-29 ENCOUNTER — Ambulatory Visit: Payer: 59 | Admitting: Family Medicine

## 2013-07-03 ENCOUNTER — Ambulatory Visit: Payer: 59 | Admitting: Family Medicine

## 2013-07-08 ENCOUNTER — Encounter: Payer: Self-pay | Admitting: Family Medicine

## 2013-07-08 ENCOUNTER — Ambulatory Visit (INDEPENDENT_AMBULATORY_CARE_PROVIDER_SITE_OTHER): Payer: 59 | Admitting: Family Medicine

## 2013-07-08 VITALS — BP 144/80 | Ht 70.0 in | Wt 301.0 lb

## 2013-07-08 DIAGNOSIS — G894 Chronic pain syndrome: Secondary | ICD-10-CM

## 2013-07-08 DIAGNOSIS — E785 Hyperlipidemia, unspecified: Secondary | ICD-10-CM

## 2013-07-08 DIAGNOSIS — I10 Essential (primary) hypertension: Secondary | ICD-10-CM

## 2013-07-08 DIAGNOSIS — F419 Anxiety disorder, unspecified: Secondary | ICD-10-CM | POA: Insufficient documentation

## 2013-07-08 DIAGNOSIS — G35 Multiple sclerosis: Secondary | ICD-10-CM

## 2013-07-08 DIAGNOSIS — F411 Generalized anxiety disorder: Secondary | ICD-10-CM

## 2013-07-08 MED ORDER — HYDROMORPHONE HCL 4 MG PO TABS: 4.0000 mg | ORAL_TABLET | Freq: Two times a day (BID) | ORAL | Status: DC

## 2013-07-08 MED ORDER — ALPRAZOLAM 0.25 MG PO TABS: 0.2500 mg | ORAL_TABLET | Freq: Two times a day (BID) | ORAL | Status: DC | PRN

## 2013-07-08 NOTE — Progress Notes (Signed)
   Subjective:    Patient ID: Melinda Moore, female    DOB: April 16, 1971, 43 y.o.   MRN: [id number]  HPI Patient is here today for a med check. This patient was seen today for chronic pain  The medication list was reviewed and updated.  Discussion was held with the patient regarding compliance with pain medication. The patient was advised the importance of maintaining medication and not using illegal substances with these. The patient was educated that we can provide 3 monthly scripts for their medication, it is their responsibility to follow the instructions. Discussion was held with the patient to make sure they're not having significant side effects. Patient is aware that pain medications are meant to minimize the severity of the pain to allow their pain levels to improve to allow for better function. They are aware of that pain medications cannot totally remove their pain.     She needs a refill on Dilaudid. She uses this for pain control she gets a lot of neuralgia pains down her legs from her MS. Causes significant pain and discomfort. She uses pain medicine approximately 2-3 times per week she denies abusing it  Also would like a medication for anxiety. She relates intermittent spells of anxiousness. She denies being depressed. She denies any nausea vomiting chest pains shortness of breath.    Review of Systems  Constitutional: Negative for activity change, appetite change and fatigue.  HENT: Negative for congestion.   Respiratory: Negative for cough.   Cardiovascular: Negative for chest pain.  Gastrointestinal: Negative for abdominal pain.  Musculoskeletal: Positive for arthralgias.       Pedal edema  Neurological: Negative for headaches.  Psychiatric/Behavioral: Negative for behavioral problems.       Objective:   Physical Exam  Vitals reviewed. Constitutional: She appears well-nourished. No distress.  Cardiovascular: Normal rate, regular rhythm and normal heart sounds.     No murmur heard. Pulmonary/Chest: Effort normal and breath sounds normal. No respiratory distress.  Musculoskeletal: She exhibits no edema.  Lymphadenopathy:    She has no cervical adenopathy.  Neurological: She is alert. She exhibits normal muscle tone.  Psychiatric: Her behavior is normal.          Assessment & Plan:  Anxiety- Buspar vs. Low dose Xanax discussed, after long discussion with ahead with Xanax to be used on a sparing basis. Cautioned drowsiness. Do not combine with other medications at the same time. If progressive troubles followup. Recheck again in several months.  Chronic pain - Diluadid 3 to 4 times per week ,#60, follow up 3 to 4  Months, the prescription I gave her should last over the next few months unless she starts using the medicine more dramatically. She does not abuse the medicine. She does not tolerate other pain medications. Her chronic pain comes from her MS.  Pedal edema- discuss with Neurology stopping Verapamil, there is no sign of CHF. I believe she is having peripheral edema do to vasodilation from calcium channel blocker. Hopefully she will be a little funny different medicine to help her with migraine prophylaxis  BP good, was good 2007, if she comes off recommend I do not think she will need additional blood pressure meds we will recheck her blood pressure when she follows up in

## 2013-07-10 ENCOUNTER — Telehealth: Payer: Self-pay | Admitting: Family Medicine

## 2013-07-10 ENCOUNTER — Encounter (HOSPITAL_COMMUNITY): Payer: Self-pay | Admitting: Oncology

## 2013-07-10 MED ORDER — BUSPIRONE HCL 15 MG PO TABS: 15.0000 mg | ORAL_TABLET | Freq: Three times a day (TID) | ORAL | Status: DC

## 2013-07-10 NOTE — Telephone Encounter (Signed)
Discussed with pt. buspar sent to pharm. Pt to bring xanax script back to office to be shredded.

## 2013-07-10 NOTE — Telephone Encounter (Signed)
In office on Wednesday. She decided she wanted to try buspar instead of xanax because it is not habit forming. She has not filled rx. She will bring back rx.

## 2013-07-10 NOTE — Telephone Encounter (Signed)
Use Buspar 15 mg one tid, need to use daily for it to be effective, she may start off 2 times daily, # 90, still put tid on script, 3 refills, foloow up at usual 3 month visit sooner if problems

## 2013-07-10 NOTE — Telephone Encounter (Signed)
Patient was given Rx for xanax. She has decided that she wants to try the other medication that was talked about at that visit for her anxiety, and she wants to bring that Rx back for xanax.   [facility]

## 2013-07-14 ENCOUNTER — Ambulatory Visit: Payer: 59 | Admitting: Family Medicine

## 2013-07-17 ENCOUNTER — Telehealth: Payer: Self-pay | Admitting: Family Medicine

## 2013-07-17 MED ORDER — PROMETHAZINE HCL 25 MG RE SUPP: RECTAL | Status: DC

## 2013-07-17 NOTE — Telephone Encounter (Signed)
Patient notified and verbalized understanding. 

## 2013-07-17 NOTE — Telephone Encounter (Signed)
Patient states she has stomach virus can you call in something at [facility].

## 2013-07-17 NOTE — Addendum Note (Signed)
Addended byOneal Deputy D on: 07/17/2013 04:39 PM   Modules accepted: Orders

## 2013-07-17 NOTE — Telephone Encounter (Signed)
Phenergan 25 mg suppos, 1 per rectum q6 hours prn nausea, #8 , 1 refill

## 2013-07-17 NOTE — Telephone Encounter (Signed)
zofran 8 mg one tid prn nausea, #21, 1 refill, clear liquids and bland diet

## 2013-07-20 ENCOUNTER — Encounter (HOSPITAL_COMMUNITY): Payer: 59

## 2013-07-28 ENCOUNTER — Ambulatory Visit (HOSPITAL_COMMUNITY): Payer: 59

## 2013-07-28 ENCOUNTER — Encounter (HOSPITAL_COMMUNITY): Payer: 59

## 2013-07-29 NOTE — Progress Notes (Signed)
This encounter was created in error - please disregard.

## 2013-07-31 ENCOUNTER — Encounter (HOSPITAL_COMMUNITY): Payer: Self-pay

## 2013-07-31 ENCOUNTER — Encounter (HOSPITAL_COMMUNITY): Payer: 59

## 2013-07-31 ENCOUNTER — Encounter (HOSPITAL_COMMUNITY): Payer: 59 | Attending: Hematology and Oncology

## 2013-07-31 VITALS — BP 129/82 | HR 103 | Temp 97.4°F | Resp 20 | Wt 317.0 lb

## 2013-07-31 DIAGNOSIS — E8779 Other fluid overload: Secondary | ICD-10-CM | POA: Insufficient documentation

## 2013-07-31 DIAGNOSIS — I10 Essential (primary) hypertension: Secondary | ICD-10-CM | POA: Diagnosis not present

## 2013-07-31 DIAGNOSIS — G510 Bell's palsy: Secondary | ICD-10-CM | POA: Insufficient documentation

## 2013-07-31 DIAGNOSIS — Z09 Encounter for follow-up examination after completed treatment for conditions other than malignant neoplasm: Secondary | ICD-10-CM | POA: Insufficient documentation

## 2013-07-31 DIAGNOSIS — G35 Multiple sclerosis: Secondary | ICD-10-CM

## 2013-07-31 DIAGNOSIS — M6281 Muscle weakness (generalized): Secondary | ICD-10-CM | POA: Insufficient documentation

## 2013-07-31 DIAGNOSIS — G894 Chronic pain syndrome: Secondary | ICD-10-CM | POA: Insufficient documentation

## 2013-07-31 DIAGNOSIS — Z86718 Personal history of other venous thrombosis and embolism: Secondary | ICD-10-CM | POA: Insufficient documentation

## 2013-07-31 DIAGNOSIS — R760 Raised antibody titer: Secondary | ICD-10-CM

## 2013-07-31 DIAGNOSIS — R894 Abnormal immunological findings in specimens from other organs, systems and tissues: Secondary | ICD-10-CM

## 2013-07-31 DIAGNOSIS — F411 Generalized anxiety disorder: Secondary | ICD-10-CM | POA: Insufficient documentation

## 2013-07-31 DIAGNOSIS — F329 Major depressive disorder, single episode, unspecified: Secondary | ICD-10-CM | POA: Insufficient documentation

## 2013-07-31 DIAGNOSIS — F3289 Other specified depressive episodes: Secondary | ICD-10-CM | POA: Insufficient documentation

## 2013-07-31 DIAGNOSIS — D6859 Other primary thrombophilia: Secondary | ICD-10-CM | POA: Insufficient documentation

## 2013-07-31 DIAGNOSIS — D649 Anemia, unspecified: Secondary | ICD-10-CM

## 2013-07-31 LAB — CBC WITH DIFFERENTIAL/PLATELET
Basophils Absolute: 0 10*3/uL (ref 0.0–0.1)
Basophils Relative: 1 % (ref 0–1)
EOS ABS: 0.1 10*3/uL (ref 0.0–0.7)
Eosinophils Relative: 2 % (ref 0–5)
HCT: 35.1 % — ABNORMAL LOW (ref 36.0–46.0)
Hemoglobin: 11.5 g/dL — ABNORMAL LOW (ref 12.0–15.0)
LYMPHS ABS: 0.6 10*3/uL — AB (ref 0.7–4.0)
Lymphocytes Relative: 11 % — ABNORMAL LOW (ref 12–46)
MCH: 26.4 pg (ref 26.0–34.0)
MCHC: 32.8 g/dL (ref 30.0–36.0)
MCV: 80.7 fL (ref 78.0–100.0)
MONOS PCT: 8 % (ref 3–12)
Monocytes Absolute: 0.4 10*3/uL (ref 0.1–1.0)
NEUTROS ABS: 3.9 10*3/uL (ref 1.7–7.7)
Neutrophils Relative %: 78 % — ABNORMAL HIGH (ref 43–77)
Platelets: 421 10*3/uL — ABNORMAL HIGH (ref 150–400)
RBC: 4.35 MIL/uL (ref 3.87–5.11)
RDW: 15.9 % — ABNORMAL HIGH (ref 11.5–15.5)
WBC: 4.9 10*3/uL (ref 4.0–10.5)

## 2013-07-31 LAB — D-DIMER, QUANTITATIVE: D-Dimer, Quant: 0.29 ug/mL-FEU (ref 0.00–0.48)

## 2013-07-31 MED ORDER — HEPARIN SOD (PORK) LOCK FLUSH 100 UNIT/ML IV SOLN
INTRAVENOUS | Status: AC
  Filled 2013-07-31: qty 5

## 2013-07-31 MED ORDER — HEPARIN SOD (PORK) LOCK FLUSH 100 UNIT/ML IV SOLN
500.0000 [IU] | Freq: Once | INTRAVENOUS | Status: AC
  Administered 2013-07-31: 500 [IU] via INTRAVENOUS

## 2013-07-31 MED ORDER — SODIUM CHLORIDE 0.9 % IJ SOLN
10.0000 mL | Freq: Once | INTRAMUSCULAR | Status: AC
  Administered 2013-07-31: 10 mL via INTRAVENOUS

## 2013-07-31 NOTE — Patient Instructions (Signed)
[facility] Discharge Instructions  RECOMMENDATIONS MADE BY THE CONSULTANT AND ANY TEST RESULTS WILL BE SENT TO YOUR REFERRING PHYSICIAN.  EXAM FINDINGS BY THE PHYSICIAN TODAY AND SIGNS OR SYMPTOMS TO REPORT TO CLINIC OR PRIMARY PHYSICIAN:   Today we did labs.   Return to see Dr. Zigmund Daniel in 6 months.    Thank you for choosing [facility] to provide your oncology and hematology care.  To afford each patient quality time with our providers, please arrive at least 15 minutes before your scheduled appointment time.  With your help, our goal is to use those 15 minutes to complete the necessary work-up to ensure our physicians have the information they need to help with your evaluation and healthcare recommendations.    Effective January 1st, 2014, we ask that you re-schedule your appointment with our physicians should you arrive 10 or more minutes late for your appointment.  We strive to give you quality time with our providers, and arriving late affects you and other patients whose appointments are after yours.    Again, thank you for choosing [facility].  Our hope is that these requests will decrease the amount of time that you wait before being seen by our physicians.       _____________________________________________________________  Should you have questions after your visit to [facility], please contact our office at [phone] between the hours of 8:30 a.m. and 5:00 p.m.  Voicemails left after 4:30 p.m. will not be returned until the following business day.  For prescription refill requests, have your pharmacy contact our office with your prescription refill request.

## 2013-07-31 NOTE — Progress Notes (Signed)
[facility]  OFFICE PROGRESS NOTE  LUKING,SCOTT, MD [address] [place] [id number]  DIAGNOSIS: Anti-cardiolipin antibody positive - Plan: CBC with Differential, CBC with Differential, D-dimer, quantitative, Cardiolipin antibodies, IgG, IgM, IgA, Cardiolipin antibodies, IgG, IgM, IgA, D-dimer, quantitative, CBC with Differential, CANCELED: D-dimer, quantitative  Anemia, unspecified - Plan: CBC with Differential  MS (multiple sclerosis)  Chief Complaint  Patient presents with  . DVT  . Multiple Sclerosis  . Bell's palsy    CURRENT THERAPY: Watchful expectation.  INTERVAL HISTORY: Melinda Marusna L Moffatt 43 y.o. female returns for followup of previous DVT treated for 6 months with anticoagulation and after [facility] opinion [facility], all anticoagulation was discontinued. The second opinion consultation occurred on 11/13/2012. Patient is anti-cardiolipin antibody positive. She suffers with generalized weakness right side greater than left. She denies any dysphagia, nausea, vomiting, but has felt as though there has been increased fluid retention in her breast. Her family physician perform mammogram and ultrasound with no findings that would suggest malignancy or inflammation. Appetite has been good with no nausea, vomiting, diarrhea, constipation, melena, hematochezia, hematuria, chest pain, PND, orthopnea, lower extremity swelling or redness, skin rash, headache, or seizures. Thyroid function is normal.  MEDICAL HISTORY: Past Medical History  Diagnosis Date  . Bell's palsy   . MS (multiple sclerosis) 1997  . DVT (deep venous thrombosis) 09/02/2012    US on 02/13/2012 showed acute DVT in left calf veins and posterior tibial veins  . Anemia   . Leukopenia   . Anxiety   . Depression   . Peripheral edema   . HTN (hypertension)   . Port catheter in place 12/31/2012    INTERIM HISTORY: has ANEMIA; NAUSEA WITH VOMITING; OTHER  DYSPHAGIA; MS (multiple sclerosis); DVT (deep venous thrombosis); Unspecified constipation; Anemia; Rectal bleeding; Elevated alkaline phosphatase level; Port catheter in place; Chronic pain syndrome; Swelling of breast; and Chronic anxiety on her problem list.   History of DVT treated for 6 months with anticoagulation, 43 y.o. female returns for followup with prior history of DVT. Since last visit, patient had a consultation, on 11/13/12 [facility]. Opinion from Dr.Moll [facility] dated June 5 and cosigned June 10. Recommendation was made to stop anticoagulation which was done at that time.  ALLERGIES:  is allergic to oxycodone and tape.  MEDICATIONS: has a current medication list which includes the following prescription(s): aspirin, buspirone, carbamazepine, diazepam, fingolimod hcl, gabapentin, hydromorphone, methylphenidate, multivitamin, nabumetone, phentermine, polyethylene glycol 400, potassium chloride, promethazine, torsemide, vitamins a & d, vitamin c & e combination, and magnesium salicylate tetrahyd.  SURGICAL HISTORY:  Past Surgical History  Procedure Laterality Date  . Abdominal hysterectomy    . Cholecystectomy    . Tubal ligation  2001  . Esophagogastroduodenoscopy  2011    Dr. Jena Gaussourk. Possible cervical esophageal with status post disruption with passage of Maloney dilator, glycol esophageal erythema, antral erosions. Biopsy from the stomach revealed minimal chronic inactive inflammation but negative for H. pylori    FAMILY HISTORY: family history includes Cirrhosis in her brother; Heart disease in her father; Hyperlipidemia in her father; Hypertension in her sister; Other in her paternal uncle.  SOCIAL HISTORY:  reports that she has never smoked. She has never used smokeless tobacco. She reports that she does not drink alcohol or use illicit drugs.  REVIEW OF SYSTEMS:  Other than that discussed above is  noncontributory.  PHYSICAL EXAMINATION: ECOG PERFORMANCE STATUS: 1 - Symptomatic but completely ambulatory  Blood pressure 129/82, pulse 103, temperature 97.4 F (36.3 C), temperature source Oral, resp. rate 20, weight 317 lb (143.79 kg).  GENERAL:alert, no distress and comfortable. Morbidly obese. SKIN: skin color, texture, turgor are normal, no rashes or significant lesions EYES: PERLA; Conjunctiva are pink and non-injected, sclera clear OROPHARYNX:no exudate, no erythema on lips, buccal mucosa, or tongue. NECK: supple, thyroid normal size, non-tender, without nodularity. No masses CHEST: Normal AP diameter with no breast masses. LifePort in place. LYMPH:  no palpable lymphadenopathy in the cervical, axillary or inguinal LUNGS: clear to auscultation and percussion with normal breathing effort HEART: regular rate & rhythm and no murmurs. ABDOMEN:abdomen soft, non-tender and normal bowel sounds MUSCULOSKELETAL:no cyanosis of digits and no clubbing. Range of motion normal.  NEURO: alert & oriented x 3 with fluent speech, no focal motor/sensory deficits. Remnants of right Bell's palsy. Generalized decreased muscle strength 4/5 with slightly worse right upper and right lower extremity.   LABORATORY DATA: Office Visit on 07/31/2013  Component Date Value Ref Range Status  . D-Dimer, Quant 07/31/2013 0.29  0.00 - 0.48 ug/mL-FEU Final   Comment:                                 AT THE INHOUSE ESTABLISHED CUTOFF                          VALUE OF 0.48 ug/mL FEU,                          THIS ASSAY HAS BEEN DOCUMENTED                          IN THE LITERATURE TO HAVE                          A SENSITIVITY AND NEGATIVE                          PREDICTIVE VALUE OF AT LEAST                          98 TO 99%.  THE TEST RESULT                          SHOULD BE CORRELATED WITH                          AN ASSESSMENT OF THE CLINICAL                          PROBABILITY OF DVT / VTE.  . WBC  07/31/2013 4.9  4.0 - 10.5 K/uL Final  . RBC 07/31/2013 4.35  3.87 - 5.11 MIL/uL Final  . Hemoglobin 07/31/2013 11.5* 12.0 - 15.0 g/dL Final  . HCT 16/03/9603 35.1* 36.0 - 46.0 % Final  . MCV 07/31/2013 80.7  78.0 - 100.0 fL Final  . MCH 07/31/2013 26.4  26.0 - 34.0 pg Final  . MCHC 07/31/2013 32.8  30.0 - 36.0 g/dL Final  . RDW 54/02/8118 15.9* 11.5 - 15.5 % Final  .  Platelets 07/31/2013 421* 150 - 400 K/uL Final  . Neutrophils Relative % 07/31/2013 78* 43 - 77 % Final  . Neutro Abs 07/31/2013 3.9  1.7 - 7.7 K/uL Final  . Lymphocytes Relative 07/31/2013 11* 12 - 46 % Final  . Lymphs Abs 07/31/2013 0.6* 0.7 - 4.0 K/uL Final  . Monocytes Relative 07/31/2013 8  3 - 12 % Final  . Monocytes Absolute 07/31/2013 0.4  0.1 - 1.0 K/uL Final  . Eosinophils Relative 07/31/2013 2  0 - 5 % Final  . Eosinophils Absolute 07/31/2013 0.1  0.0 - 0.7 K/uL Final  . Basophils Relative 07/31/2013 1  0 - 1 % Final  . Basophils Absolute 07/31/2013 0.0  0.0 - 0.1 K/uL Final    PATHOLOGY: No new pathology.  Urinalysis    Component Value Date/Time   COLORURINE YELLOW 03/26/2011 1918   APPEARANCEUR HAZY* 03/26/2011 1918   LABSPEC >1.030* 03/26/2011 1918   PHURINE 6.5 03/26/2011 1918   GLUCOSEU NEGATIVE 03/26/2011 1918   HGBUR NEGATIVE 03/26/2011 1918   BILIRUBINUR SMALL* 03/26/2011 1918   KETONESUR 15* 03/26/2011 1918   PROTEINUR 30* 03/26/2011 1918   UROBILINOGEN 2.0* 03/26/2011 1918   NITRITE NEGATIVE 03/26/2011 1918   LEUKOCYTESUR SMALL* 03/26/2011 1918    RADIOGRAPHIC STUDIES: MM Digital Diagnostic Bilat Status: Final result            Study Result    CLINICAL DATA: 43 year old due for annual examination and recent  diffuse pain in the right breast.  EXAM:  DIGITAL DIAGNOSTIC BILATERAL MAMMOGRAM WITH CAD  COMPARISON: 11/27/2000  ACR Breast Density Category a: The breast tissue is almost entirely  fatty.  FINDINGS:  No mass, suspicious microcalcification, or distortion is identified   in either breast to suggest malignancy.  Mammographic images were processed with CAD.  IMPRESSION:  No evidence of malignancy in either breast.  RECOMMENDATION:  Bilateral screening mammogram in 1 year.  I have discussed the findings and recommendations with the patient.  Results were also provided in writing at the conclusion of the  visit. If applicable, a reminder letter will be sent to the patient  regarding the next appointment.  BI-RADS CATEGORY 1: Negative  Electronically Signed  By: Britta Mccreedy M.D.  On: 04/15/2013 15:      ASSESSMENT:  #1. 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 #2. Multiple sclerosis, on treatment. #3. Fluid retention probably due to medication. #4. Hypertension, controlled.   PLAN:  #1. Continue watchful expectation. #2. Followup in 6 months with CBC, d-dimer, and anticardiolipin antibody titer.   All questions were answered. The patient knows to call the clinic with any problems, questions or concerns. We can certainly see the patient much sooner if necessary.   I spent 25 minutes counseling the patient face to face. The total time spent in the appointment was 30 minutes.    Maurilio Lovely, MD 07/31/2013 3:06 PM

## 2013-07-31 NOTE — Progress Notes (Unsigned)
Melinda Moore presented for Portacath access and flush. Proper placement of portacath confirmed by CXR. Portacath located mid chest wall accessed with  H 20 needle. Good blood return present. Portacath flushed with 55ml NS and 500U/67ml Heparin and needle removed intact. Procedure without incident. Patient tolerated procedure well.

## 2013-08-03 LAB — CARDIOLIPIN ANTIBODIES, IGG, IGM, IGA
ANTICARDIOLIPIN IGA: 5 U/mL — AB (ref <22)
Anticardiolipin IgG: 27 GPL U/mL — ABNORMAL HIGH (ref <23)
Anticardiolipin IgM: 8 MPL U/mL — ABNORMAL LOW (ref <11)

## 2013-08-11 ENCOUNTER — Telehealth (HOSPITAL_COMMUNITY): Payer: Self-pay

## 2013-08-11 ENCOUNTER — Telehealth (HOSPITAL_COMMUNITY): Payer: Self-pay | Admitting: Hematology and Oncology

## 2013-08-11 NOTE — Telephone Encounter (Signed)
Call from patient requesting lab results and wanting to know if she should continue to remain off anticoagulants.  Can be reached by cell # [phone].

## 2013-08-12 NOTE — Telephone Encounter (Signed)
Informed patient of lab results and told her NOT to take anticoagulants.

## 2013-08-12 NOTE — Telephone Encounter (Signed)
No answer

## 2013-09-21 DIAGNOSIS — M545 Low back pain, unspecified: Secondary | ICD-10-CM | POA: Diagnosis not present

## 2013-09-21 DIAGNOSIS — G35 Multiple sclerosis: Secondary | ICD-10-CM | POA: Diagnosis not present

## 2013-10-13 ENCOUNTER — Encounter (HOSPITAL_COMMUNITY): Payer: 59 | Attending: Hematology and Oncology

## 2013-10-13 DIAGNOSIS — Z95828 Presence of other vascular implants and grafts: Secondary | ICD-10-CM

## 2013-10-13 DIAGNOSIS — Z9889 Other specified postprocedural states: Secondary | ICD-10-CM | POA: Insufficient documentation

## 2013-10-13 DIAGNOSIS — Z452 Encounter for adjustment and management of vascular access device: Secondary | ICD-10-CM

## 2013-10-13 DIAGNOSIS — I82409 Acute embolism and thrombosis of unspecified deep veins of unspecified lower extremity: Secondary | ICD-10-CM

## 2013-10-13 MED ORDER — HEPARIN SOD (PORK) LOCK FLUSH 100 UNIT/ML IV SOLN
INTRAVENOUS | Status: AC
  Filled 2013-10-13: qty 5

## 2013-10-13 MED ORDER — HEPARIN SOD (PORK) LOCK FLUSH 100 UNIT/ML IV SOLN
500.0000 [IU] | Freq: Once | INTRAVENOUS | Status: AC
  Administered 2013-10-13: 500 [IU] via INTRAVENOUS

## 2013-10-13 MED ORDER — SODIUM CHLORIDE 0.9 % IJ SOLN
10.0000 mL | INTRAMUSCULAR | Status: DC | PRN
  Administered 2013-10-13: 10 mL via INTRAVENOUS

## 2013-10-13 NOTE — Progress Notes (Signed)
Melinda Moore presented for Portacath access and flush. Proper placement of portacath confirmed by CXR. Portacath located left center chest wall accessed with  H 20 needle. Good blood return present. Portacath flushed with 58ml NS and 500U/8ml Heparin and needle removed intact. Procedure without incident. Patient tolerated procedure well.

## 2013-10-21 ENCOUNTER — Telehealth: Payer: Self-pay | Admitting: Family Medicine

## 2013-10-21 NOTE — Telephone Encounter (Signed)
Last seen 07/08/13

## 2013-10-21 NOTE — Telephone Encounter (Signed)
Pt requesting refill of her RITALIN 30mg , states she's completely out, explained not to wait till out of meds for refills and that the refill could take a couple days, please call when ready

## 2013-10-22 ENCOUNTER — Other Ambulatory Visit: Payer: Self-pay | Admitting: *Deleted

## 2013-10-22 DIAGNOSIS — M5126 Other intervertebral disc displacement, lumbar region: Secondary | ICD-10-CM | POA: Diagnosis not present

## 2013-10-22 MED ORDER — METHYLPHENIDATE HCL ER (LA) 30 MG PO CP24: 30.0000 mg | ORAL_CAPSULE | Freq: Every day | ORAL | Status: DC

## 2013-10-22 NOTE — Telephone Encounter (Signed)
Rx up front for patient pick up. Patient notified. 

## 2013-10-22 NOTE — Telephone Encounter (Signed)
May have refill BUT needs OV before more.

## 2013-10-28 DIAGNOSIS — IMO0002 Reserved for concepts with insufficient information to code with codable children: Secondary | ICD-10-CM | POA: Diagnosis not present

## 2013-11-04 ENCOUNTER — Ambulatory Visit: Payer: 59 | Admitting: Nurse Practitioner

## 2013-11-11 DIAGNOSIS — M47817 Spondylosis without myelopathy or radiculopathy, lumbosacral region: Secondary | ICD-10-CM | POA: Diagnosis not present

## 2013-11-11 DIAGNOSIS — M545 Low back pain, unspecified: Secondary | ICD-10-CM | POA: Diagnosis not present

## 2013-11-16 ENCOUNTER — Ambulatory Visit: Payer: 59 | Admitting: Nurse Practitioner

## 2013-11-18 ENCOUNTER — Ambulatory Visit: Payer: 59 | Admitting: Nurse Practitioner

## 2013-11-18 DIAGNOSIS — M545 Low back pain, unspecified: Secondary | ICD-10-CM | POA: Diagnosis not present

## 2013-11-18 DIAGNOSIS — M47817 Spondylosis without myelopathy or radiculopathy, lumbosacral region: Secondary | ICD-10-CM | POA: Diagnosis not present

## 2013-11-24 ENCOUNTER — Encounter (HOSPITAL_COMMUNITY): Payer: 59

## 2013-12-01 ENCOUNTER — Encounter (HOSPITAL_COMMUNITY): Payer: 59 | Attending: Hematology and Oncology

## 2013-12-01 DIAGNOSIS — I82409 Acute embolism and thrombosis of unspecified deep veins of unspecified lower extremity: Secondary | ICD-10-CM

## 2013-12-01 DIAGNOSIS — Z452 Encounter for adjustment and management of vascular access device: Secondary | ICD-10-CM | POA: Diagnosis not present

## 2013-12-01 DIAGNOSIS — Z9889 Other specified postprocedural states: Secondary | ICD-10-CM | POA: Insufficient documentation

## 2013-12-01 DIAGNOSIS — Z95828 Presence of other vascular implants and grafts: Secondary | ICD-10-CM

## 2013-12-01 MED ORDER — SODIUM CHLORIDE 0.9 % IJ SOLN
10.0000 mL | INTRAMUSCULAR | Status: DC | PRN
  Administered 2013-12-01: 10 mL via INTRAVENOUS

## 2013-12-01 MED ORDER — HEPARIN SOD (PORK) LOCK FLUSH 100 UNIT/ML IV SOLN
500.0000 [IU] | Freq: Once | INTRAVENOUS | Status: AC
  Administered 2013-12-01: 500 [IU] via INTRAVENOUS
  Filled 2013-12-01: qty 5

## 2013-12-01 NOTE — Progress Notes (Signed)
Melinda Moore presented for Portacath access and flush. Proper placement of portacath confirmed by CXR. Portacath located mid to left chest wall accessed with  H 20 needle. Good blood return present. Portacath flushed with 71ml NS and 500U/31ml Heparin and needle removed intact. Procedure without incident. Patient tolerated procedure well.

## 2013-12-02 ENCOUNTER — Encounter: Payer: Self-pay | Admitting: Family Medicine

## 2013-12-02 ENCOUNTER — Telehealth: Payer: Self-pay | Admitting: Family Medicine

## 2013-12-02 ENCOUNTER — Ambulatory Visit (INDEPENDENT_AMBULATORY_CARE_PROVIDER_SITE_OTHER): Payer: 59 | Admitting: Family Medicine

## 2013-12-02 VITALS — BP 124/80 | Ht 69.0 in | Wt 317.0 lb

## 2013-12-02 DIAGNOSIS — M545 Low back pain, unspecified: Secondary | ICD-10-CM | POA: Diagnosis not present

## 2013-12-02 MED ORDER — METAXALONE 800 MG PO TABS: 800.0000 mg | ORAL_TABLET | Freq: Three times a day (TID) | ORAL | Status: DC | PRN

## 2013-12-02 MED ORDER — OXYCODONE-ACETAMINOPHEN 5-325 MG PO TABS: 1.0000 | ORAL_TABLET | ORAL | Status: DC | PRN

## 2013-12-02 NOTE — Telephone Encounter (Signed)
Rx sent electronically to pharmacy. Physical Therapy will contact patient to schedule first appt. Patient notified.

## 2013-12-02 NOTE — Telephone Encounter (Signed)
Pt would like to try Skelaxin again for her muscle relaxer  States you have given it to her in the past   Please call the pt when sent in   Last seen 12/02/13   [facility]

## 2013-12-02 NOTE — Telephone Encounter (Signed)
Error

## 2013-12-02 NOTE — Telephone Encounter (Signed)
Should be noted that I did refer the patient for physical therapy she will need evaluation and treatment for low back pain and right leg sciatica more than likely to visit per week for the next 4 weeks. In addition to this the patient requests that her visits be somewhere between 11 and 2 PM. Please help set this up. I put a referral in but this referral needs to be worked on this week.

## 2013-12-02 NOTE — Progress Notes (Signed)
   Subjective:    Patient ID: Melinda Moore, female    DOB: 06-19-1970, 43 y.o.   MRN: [id number]  Back Pain This is a recurrent problem. The pain is present in the lumbar spine. The pain radiates to the left foot and right foot (right leg). Treatments tried: advil. The treatment provided no relief.  Had MRI at [facility]. Patient brought in copy of report.  Has had two injections in her back. One May 20th and the other one in June. Saw Dr. Alvester MorinNewton. Does not have a follow up with specialist.   Can walk for about 10 minutes then has to sit, due to increased lower back pain then weakness as well. Weakness is more due to the walking   Review of Systems  Musculoskeletal: Positive for back pain and gait problem. Negative for arthralgias, joint swelling and myalgias.   she denies any loss of bowel or bladder control.     Objective:   Physical Exam Lumbar pain r leg sciatica Strength fair She can walk on her toes. She has some difficulty walking any long distance.     [phone] btwn 11-1pm  Assessment & Plan:  Chronic pain and discomfort in her low back as well as sciatica down the right leg. She has tried nerve root injections without help. They recommended a nurse she did not want to do that. She follows up today we discussed pain medication Percocet for use try not to overuse it cautioned drowsiness in addition to all of this we will set her up for physical therapy and  25 minutes was spent with the patient evaluating her back pain reviewing over the stuff she had and talking to her about treatment options.

## 2013-12-02 NOTE — Telephone Encounter (Signed)
Skelaxin one 3 times a day when necessary, #30, 3 refills, I wouldn't cautioned the patient that she should not use it frequently because it can cause drowsiness.

## 2013-12-09 ENCOUNTER — Ambulatory Visit: Payer: 59 | Admitting: Nurse Practitioner

## 2013-12-24 ENCOUNTER — Other Ambulatory Visit: Payer: Self-pay | Admitting: Family Medicine

## 2013-12-29 ENCOUNTER — Ambulatory Visit: Payer: 59 | Admitting: Family Medicine

## 2013-12-30 ENCOUNTER — Ambulatory Visit (HOSPITAL_COMMUNITY): Payer: 59 | Attending: Family Medicine | Admitting: Physical Therapy

## 2014-01-04 ENCOUNTER — Ambulatory Visit (INDEPENDENT_AMBULATORY_CARE_PROVIDER_SITE_OTHER): Payer: 59 | Admitting: Nurse Practitioner

## 2014-01-04 MED ORDER — PHENTERMINE HCL 37.5 MG PO TABS: 37.5000 mg | ORAL_TABLET | Freq: Every day | ORAL | Status: DC

## 2014-01-04 NOTE — Patient Instructions (Signed)
Dr. Lowell GuitarPowell  [facility]

## 2014-01-05 ENCOUNTER — Encounter: Payer: Self-pay | Admitting: Nurse Practitioner

## 2014-01-05 NOTE — Progress Notes (Signed)
Subjective:  Presents to discuss her weight. Would like to restart phentermine. Is also interested in bariatric surgery. Activity is somewhat limited due to him MS. Has taken phentermine in the past but difficulty. Would like to get back below 300 pounds.  Objective:   BP 128/80  Ht 5\' 9"  (1.753 m)  Wt 314 lb (142.429 kg)  BMI 46.35 kg/m2 NAD. Alert, oriented. Lungs clear. Heart regular rate rhythm.  Assessment: Morbid obesity  Plan:  Meds ordered this encounter  Medications  . amitriptyline (ELAVIL) 25 MG tablet    Sig: Take 75 mg by mouth at bedtime.  . phentermine (ADIPEX-P) 37.5 MG tablet    Sig: Take 1 tablet (37.5 mg total) by mouth daily before breakfast.    Dispense:  30 tablet    Refill:  2    Order Specific Question:  Supervising Provider    Answer:  Merlyn Albert [[id number]]    given information for free information sessions on bariatric surgery. It is unclear at this point whether patient will be a candidate. Reminded patient about preventive health physicals.

## 2014-01-06 ENCOUNTER — Ambulatory Visit (HOSPITAL_COMMUNITY): Payer: 59 | Attending: Family Medicine | Admitting: Physical Therapy

## 2014-01-07 ENCOUNTER — Telehealth: Payer: Self-pay | Admitting: Family Medicine

## 2014-01-07 NOTE — Telephone Encounter (Signed)
Patient wants Rx wrote for Ritalin 30 mg. Also, wants Rx for hydromorphone-She said this was wrote for her a long time ago and she doesn't remember the dosage.

## 2014-01-11 MED ORDER — OXYCODONE-ACETAMINOPHEN 5-325 MG PO TABS: 1.0000 | ORAL_TABLET | ORAL | Status: DC | PRN

## 2014-01-11 MED ORDER — METHYLPHENIDATE HCL ER (LA) 30 MG PO CP24: 30.0000 mg | ORAL_CAPSULE | Freq: Every day | ORAL | Status: DC

## 2014-01-11 NOTE — Telephone Encounter (Signed)
She may have a refill on the oxycodone

## 2014-01-11 NOTE — Telephone Encounter (Signed)
Nurses #1 may go ahead with a refill on Ritalin I will sign it. #2 inquire with the patient why she needs a change in pain medications it seemed that she was doing fine on the oxycodone. This patient cannot be prescribed both. Try to find out what is going on in regards to the oxycodone in if it is or is not relieving her pain. Also I need for more details on where her pain is.

## 2014-01-11 NOTE — Telephone Encounter (Signed)
Patient make appt for chronic pain management in September and will discuss Dilaudid vs Oxycodone at that visit but would like a refill of Oxycodone until then

## 2014-01-11 NOTE — Telephone Encounter (Signed)
Rxs up front for patient pick up. Patient notified. 

## 2014-01-11 NOTE — Telephone Encounter (Signed)
Please get Dr. Lorin Picket to refill ritalin; it is on med list. As far as pain med, I will need to review paper chart on Wednesday. If patient needs answer now, please consult with Scott. Thanks.

## 2014-01-12 ENCOUNTER — Encounter (HOSPITAL_COMMUNITY): Payer: 59

## 2014-01-21 ENCOUNTER — Ambulatory Visit (HOSPITAL_COMMUNITY): Payer: 59 | Attending: Family Medicine | Admitting: Physical Therapy

## 2014-01-22 ENCOUNTER — Ambulatory Visit (INDEPENDENT_AMBULATORY_CARE_PROVIDER_SITE_OTHER): Payer: 59 | Admitting: Nurse Practitioner

## 2014-01-22 ENCOUNTER — Encounter: Payer: Self-pay | Admitting: Nurse Practitioner

## 2014-01-22 VITALS — BP 130/70 | Temp 98.6°F | Ht 69.0 in | Wt 321.1 lb

## 2014-01-22 DIAGNOSIS — N63 Unspecified lump in unspecified breast: Secondary | ICD-10-CM

## 2014-01-22 DIAGNOSIS — N6320 Unspecified lump in the left breast, unspecified quadrant: Secondary | ICD-10-CM

## 2014-01-28 ENCOUNTER — Encounter: Payer: Self-pay | Admitting: Nurse Practitioner

## 2014-01-28 NOTE — Progress Notes (Signed)
Subjective:  Presents for c/o tender lump in left breast first noticed 2 days ago. No family history of breast cancer. Has had hysterectomy but still has ovaries. No fever. No excessive caffeine intake.   Objective:   BP 130/70  Temp(Src) 98.6 F (37 C) (Oral)  Ht 5\' 9"  (1.753 m)  Wt 321 lb 2 oz (145.661 kg)  BMI 47.40 kg/m2 NAD. Alert, oriented. Multiple fine nodularity in both breasts. Small tender nodule noted left breast approx 10 o'clock about 2-3 cm from areola. Axillae no adenopathy. See last mammogram 04/06/13.  Assessment: Breast mass, left - Plan: MM Digital Diagnostic Unilat L  Plan: further follow up based on mammogram. Call back sooner if worse.

## 2014-01-29 ENCOUNTER — Ambulatory Visit (HOSPITAL_COMMUNITY): Payer: 59

## 2014-01-30 NOTE — Progress Notes (Signed)
This encounter was created in error - please disregard.

## 2014-01-31 NOTE — Progress Notes (Signed)
No show  Melinda Moore,Melinda Moore 02/01/2014    

## 2014-02-01 ENCOUNTER — Ambulatory Visit (HOSPITAL_COMMUNITY): Payer: 59 | Admitting: Oncology

## 2014-02-01 ENCOUNTER — Telehealth: Payer: Self-pay | Admitting: Family Medicine

## 2014-02-01 NOTE — Telephone Encounter (Signed)
The husband expressed concern that the patient was having some swelling in her legs. This seemed to be a chronic issue. This patient suffers with obesity, MS and also is on Neurontin for her peripheral neuropathy. All of this contributes to swelling in the legs. She is already taking Demadex 2 in the morning one in the. Please call the patient find out how the swelling is doing seen if there is any significant change. Unfortunately it is unlikely that all of the swelling will go away. Please relate the above information to the patient. If necessary we may consider increasing Demadex to taking 2 in the morning and 2 at noon but I will wait to see what you find out with phone call. Thank you

## 2014-02-02 ENCOUNTER — Encounter (HOSPITAL_COMMUNITY): Payer: 59

## 2014-02-02 NOTE — Telephone Encounter (Signed)
Patient states that her swelling seems to be under control right now with the current therapy. She states that it is just an issue she has to deal with on a everyday basis.

## 2014-02-08 ENCOUNTER — Ambulatory Visit (INDEPENDENT_AMBULATORY_CARE_PROVIDER_SITE_OTHER): Payer: 59 | Admitting: Family Medicine

## 2014-02-08 ENCOUNTER — Encounter: Payer: Self-pay | Admitting: Family Medicine

## 2014-02-08 ENCOUNTER — Ambulatory Visit (HOSPITAL_COMMUNITY): Payer: 59 | Admitting: Physical Therapy

## 2014-02-08 VITALS — BP 110/70 | Ht 69.0 in | Wt 324.1 lb

## 2014-02-08 DIAGNOSIS — Z79899 Other long term (current) drug therapy: Secondary | ICD-10-CM | POA: Diagnosis not present

## 2014-02-08 DIAGNOSIS — Z0189 Encounter for other specified special examinations: Secondary | ICD-10-CM

## 2014-02-08 DIAGNOSIS — R7301 Impaired fasting glucose: Secondary | ICD-10-CM

## 2014-02-08 MED ORDER — TRAZODONE HCL 50 MG PO TABS: 25.0000 mg | ORAL_TABLET | Freq: Every evening | ORAL | Status: DC | PRN

## 2014-02-08 MED ORDER — METAXALONE 800 MG PO TABS: 800.0000 mg | ORAL_TABLET | Freq: Three times a day (TID) | ORAL | Status: DC | PRN

## 2014-02-08 MED ORDER — HYDROMORPHONE HCL 4 MG PO TABS: 4.0000 mg | ORAL_TABLET | Freq: Two times a day (BID) | ORAL | Status: DC | PRN

## 2014-02-08 NOTE — Progress Notes (Signed)
   Subjective:    Patient ID: Melinda Moore, female    DOB: May 18, 1971, 43 y.o.   MRN: [id number]  HPI This patient was seen today for chronic pain  The medication list was reviewed and updated.   -Compliance with pain medication: yes Pain mainly in the back and neuropathy on the right side. The patient was advised the importance of maintaining medication and not using illegal substances with these.  Refills needed: none  The patient was educated that we can provide 3 monthly scripts for their medication, it is their responsibility to follow the instructions.  Side effects or complications from medications: none  Patient is aware that pain medications are meant to minimize the severity of the pain to allow their pain levels to improve to allow for better function. They are aware of that pain medications cannot totally remove their pain.  Due for UDT ( at least once per year) : this fall  Patient states she wants to start back on the Hydromorphone. No other concerns at this time.    Long discussion held regarding diabetes Re: Wei regarding exercise diet importance of keeping cholesterol sugar under control as well as blood pressure. Also discussed taking her pain medicine from oxycodone to dilaudid  Review of Systems  Constitutional: Negative for activity change, appetite change and fatigue.  Respiratory: Negative for cough.   Cardiovascular: Negative for chest pain.  Gastrointestinal: Negative for abdominal pain.  Endocrine: Negative for polydipsia and polyphagia.  Genitourinary: Negative for frequency.  Neurological: Positive for weakness (right side associated with MS) and numbness.  Psychiatric/Behavioral: Negative for confusion.       Objective:   Physical Exam  Vitals reviewed. Constitutional: She appears well-nourished. No distress.  Cardiovascular: Normal rate, regular rhythm and normal heart sounds.   No murmur heard. Pulmonary/Chest: Effort normal and breath sounds  normal. No respiratory distress.  Musculoskeletal: She exhibits no edema.  Lymphadenopathy:    She has no cervical adenopathy.  Neurological: She is alert. She exhibits normal muscle tone.  Psychiatric: Her behavior is normal.          Assessment & Plan:  Progressive pain and discomfort despite her best efforts. Switch pain medicine as directed do not exceed more than 2 tablets daily she denies drowsiness with that. Follow up in several months time if doing well on this and will issue several prescriptions. Again.  MS stable followup with specialists  Significant swelling in the legs I believe this is related to Neurontin. I've encouraged patient to try and reduce the Neurontin I not convinced it is helping if reducing the dose helps the swelling that would be great  Patient is due for lab work including looking at cholesterol A1c liver function and kidney function she has a history of hyperglycemia and hyperlipidemia

## 2014-02-10 ENCOUNTER — Ambulatory Visit: Payer: 59 | Admitting: Family Medicine

## 2014-02-10 ENCOUNTER — Ambulatory Visit (HOSPITAL_COMMUNITY)
Admission: RE | Admit: 2014-02-10 | Discharge: 2014-02-10 | Disposition: A | Discharge status: Home / Self Care | Payer: 59 | Source: Ambulatory Visit | Attending: Family Medicine | Admitting: Family Medicine

## 2014-02-10 DIAGNOSIS — M545 Low back pain, unspecified: Secondary | ICD-10-CM | POA: Insufficient documentation

## 2014-02-10 DIAGNOSIS — R262 Difficulty in walking, not elsewhere classified: Secondary | ICD-10-CM | POA: Insufficient documentation

## 2014-02-10 DIAGNOSIS — IMO0001 Reserved for inherently not codable concepts without codable children: Secondary | ICD-10-CM | POA: Insufficient documentation

## 2014-02-10 DIAGNOSIS — R269 Unspecified abnormalities of gait and mobility: Secondary | ICD-10-CM | POA: Diagnosis not present

## 2014-02-10 DIAGNOSIS — M5442 Lumbago with sciatica, left side: Secondary | ICD-10-CM

## 2014-02-10 DIAGNOSIS — M62838 Other muscle spasm: Secondary | ICD-10-CM | POA: Diagnosis not present

## 2014-02-10 NOTE — Evaluation (Signed)
Physical Therapy Evaluation  Patient Details  Name: Melinda Moore MRN: [id number] Date of Birth: Mar 31, 1971  Today's Date: 02/10/2014 Time: 1515-1600 PT Time Calculation (min): 45 min     Charges: 1 Evaluation, TE 1545-1600         Visit#: 1 of 16  Re-eval: 03/12/14 Assessment Diagnosis: Low back pain secondary to L4-5-S1 disc pathologyu Next MD Visit: Lilyan Punt Prior Therapy: No  Authorization: [employer]     Past Medical History:  Past Medical History  Diagnosis Date  . Bell's palsy   . MS (multiple sclerosis) 1997  . DVT (deep venous thrombosis) 09/02/2012    [place] on 02/13/2012 showed acute DVT in left calf veins and posterior tibial veins  . Anemia   . Leukopenia   . Anxiety   . Depression   . Peripheral edema   . HTN (hypertension)   . Port catheter in place 12/31/2012   Past Surgical History:  Past Surgical History  Procedure Laterality Date  . Abdominal hysterectomy    . Cholecystectomy    . Tubal ligation  2001  . Esophagogastroduodenoscopy  2011    Dr. Jena Gauss. Possible cervical esophageal with status post disruption with passage of Maloney dilator, glycol esophageal erythema, antral erosions. Biopsy from the stomach revealed minimal chronic inactive inflammation but negative for H. pylori    Subjective Symptoms/Limitations Pertinent History: Patient had an MRI and found a herniated disc, patient has had 2 shots to relieve pain but had no relief. Paitent has pain in low back radiatign into Rt butt, anterior thigh, patient has been in pain for over one year. originally it was attributed to her history of MS.  Limitations: Sitting;Walking;Standing How long can you stand comfortably?: <5 minutes How long can you walk comfortably?: <10 minutes Patient Stated Goals: to decrease pain, lose weight, become more phsyically active, "I want my life back, to be able to work out, cook, play in yard, and standign with less pain." Pain Assessment Currently in Pain?: Yes Pain  Score: 4  Pain Location: Back Pain Orientation: Right;Lower Pain Type: Chronic pain Pain Radiating Towards: butt, anteriro thigh, and Rt foot tingling, aches in plantar surface of foot. weakness in Rt LE Pain Onset: More than a month ago Pain Frequency: Constant Pain Relieving Factors: sitting, heating pad, ice, pain meds  Effect of Pain on Daily Activities: difficultywalking, standing  Cognition/Observation Observation/Other Assessments Observations: Gait: limited toe out, excessive medial foot pressure throughtou gait, lexcessive knee valgus, limited hip internal rotation, limited hip extension, Limited hip sway, minor trendelenberg bilaterally.  Other Assessments: Posture:   Sensation/Coordination/Flexibility/Functional Tests Flexibility Thomas: Positive 90/90: Positive Functional Tests Functional Tests: Ely's test: positive Functional Tests: Piriformis test: positive Functional Tests: :  Assessment RLE Strength Right Hip Flexion: 3+/5 Right Hip External Rotation : 55 Right Hip Internal Rotation : 12 Right Knee Flexion: 3+/5 Right Knee Extension: 4/5 Right Ankle Dorsiflexion: 4/5 Lumbar AROM Lumbar Flexion: 70 Lumbar Extension: 22  Exercise/Treatments 3D hip excursion 10x prone extension stretch 4x 15"   hip flexor stretch 4x 15"   hamstring stretch 4x 15"   piriformis stretch 4x 15"  Physical Therapy Assessment and Plan PT Assessment and Plan Clinical Impression Statement: Patient displays low back pain secondary to a forward flexed posture attributed to limited hip flexor and limited rectus femoris mobility resultign in excessive anterior tilting of the pelvis. other contributign factors include limited: gastroc, piriformis, and hamstring mobility and decreased glut, and lobar spine strength. Followign prone extension stretch patient notes  decreased pain and improved ability to tolerate stanidng exercises. follwoing stretches patient displays improved gait and  decreased pain. Patient will benefit from skileld phsyical therapy to decrease mobility limitations of hips and improve posture to return to performing all dialy house hold activities and possibly return to working.   Pt will benefit from skilled therapeutic intervention in order to improve on the following deficits: Abnormal gait;Increased fascial restricitons;Decreased mobility;Decreased activity tolerance;Decreased range of motion;Increased muscle spasms;Improper body mechanics;Improper spinal/pelvic alignment;Decreased balance;Impaired flexibility;Obesity;Decreased strength;Pain;Impaired sensation;Difficulty walking Rehab Potential: Good Clinical Impairments Affecting Rehab Potential: patient highly motivated and demsontrates an initially very positive response to therapy PT Frequency: Min 2X/week PT Duration: 8 weeks PT Treatment/Interventions: Gait training;Stair training;Functional mobility training;Therapeutic activities;Therapeutic exercise;Balance training;Neuromuscular re-education;Modalities;Manual techniques;Patient/family education PT Plan: Initial focus on re-estabilishing lumbar and hip extension via prone extension stretching progressign to stanidng as able, and hip extension stretching progressign to multiplanar movemnts as tolerated. Next session further introduce stretches to increase LE mobility. Once mobility improves begin glut med/max strengthengin to improve functioanl strength to be able to pick up heavy objects from floor.     Goals Home Exercise Program Pt/caregiver will Perform Home Exercise Program: For increased ROM PT Goal: Perform Home Exercise Program - Progress: Goal set today PT Short Term Goals Time to Complete Short Term Goals: 4 weeks PT Short Term Goal 1: Patient will demosntrate increase hip extenion to 0 degrees to be able to stand up straight PT Short Term Goal 2: Patient will demonstrate a negative piriformi test and increas hip internal rotation to >25  degrees to increase shock absorbtion during gait.  PT Short Term Goal 3: patient will demosntrate increased lumbar spine extnsion to >30 degrees to be able to lean back to look up at cieling PT Short Term Goal 4: patient will demosntrate increased lumbar spine flexionto >80 degrees to be able to better lean forward to touch toes.  PT Short Term Goal 5: Patient will demosntrate increase hip extenion to 10 degrees to be walk with increased stride length PT Long Term Goals Time to Complete Long Term Goals: 8 weeks PT Long Term Goal 1: Patient will demonstrate a negative 90/90 hamstring test to be able to touch toes.  PT Long Term Goal 2: Patient will demosntrate increased ankle dorsiflexion mobility > 15 degrees to decrease early heel rise during gait Long Term Goal 3: Patient will demosntrate negative ely's test  and negative thomas test indicating increased quadraceps/hipflexor mobility to be able to stnad more errect durign gait Long Term Goal 4: Patient will demonstrate increased glut max/med strength of 4/5 MMT to allowpatient to be bale to bend over and lift things off the ground while gardening  Problem List Patient Active Problem List   Diagnosis Date Noted  . Lumbago 02/10/2014  . Chronic anxiety 07/08/2013  . Swelling of breast 04/08/2013  . Chronic pain syndrome 03/11/2013  . Port catheter in place 12/31/2012  . Unspecified constipation 10/01/2012  . Anemia 10/01/2012  . Rectal bleeding 10/01/2012  . Elevated alkaline phosphatase level 10/01/2012  . MS (multiple sclerosis) 09/02/2012  . DVT (deep venous thrombosis) 09/02/2012  . ANEMIA 04/12/2010  . NAUSEA WITH VOMITING 04/12/2010  . OTHER DYSPHAGIA 04/12/2010    PT - End of Session Activity Tolerance: Patient tolerated treatment well General Behavior During Therapy: WFL for tasks assessed/performed PT Plan of Care PT Home Exercise Plan: 3D hip excursion 10x, prone extension stretch, hip flexor stretch, hamstring stretch,  piriformis stretch  GP Functional Assessment Tool Used:  FOTO Functional Limitation: Mobility: Walking and moving around Mobility: Walking and Moving Around Current Status [phone]): At least 40 percent but less than 60 percent impaired, limited or restricted Mobility: Walking and Moving Around Goal Status [phone]): At least 20 percent but less than 40 percent impaired, limited or restricted  Doyne Keel 02/10/2014, 5:51 PM  Physician Documentation Your signature is required to indicate approval of the treatment plan as stated above.  Please sign and either send electronically or make a copy of this report for your files and return this physician signed original.   Please mark one 1.__approve of plan  2. ___approve of plan with the following conditions.   ______________________________                                                          _____________________ Physician Signature                                                                                                             Date

## 2014-02-18 ENCOUNTER — Telehealth (HOSPITAL_COMMUNITY): Payer: Self-pay

## 2014-02-18 ENCOUNTER — Ambulatory Visit (HOSPITAL_COMMUNITY): Admission: RE | Admit: 2014-02-18 | Payer: 59 | Source: Ambulatory Visit | Admitting: Physical Therapy

## 2014-02-23 ENCOUNTER — Ambulatory Visit (HOSPITAL_COMMUNITY)
Admission: RE | Admit: 2014-02-23 | Discharge: 2014-02-23 | Disposition: A | Discharge status: Home / Self Care | Payer: 59 | Source: Ambulatory Visit | Attending: Physical Therapy | Admitting: Physical Therapy

## 2014-02-23 DIAGNOSIS — R262 Difficulty in walking, not elsewhere classified: Secondary | ICD-10-CM | POA: Diagnosis not present

## 2014-02-23 DIAGNOSIS — IMO0001 Reserved for inherently not codable concepts without codable children: Secondary | ICD-10-CM | POA: Diagnosis not present

## 2014-02-23 DIAGNOSIS — M545 Low back pain, unspecified: Secondary | ICD-10-CM | POA: Diagnosis not present

## 2014-02-23 DIAGNOSIS — M62838 Other muscle spasm: Secondary | ICD-10-CM | POA: Diagnosis not present

## 2014-02-23 DIAGNOSIS — R269 Unspecified abnormalities of gait and mobility: Secondary | ICD-10-CM | POA: Diagnosis not present

## 2014-02-23 NOTE — Progress Notes (Signed)
Physical Therapy Treatment Patient Details  Name: Melinda Moore MRN: [id number] Date of Birth: 08/07/1970  Today's Date: 02/23/2014 Time: 4098-1191 PT Time Calculation (min): 43 min TE 4782-9562, Ice 1556-1601  Visit#: 2 of 16  Re-eval: 03/12/14      Subjective: Pain Assessment Currently in Pain?: Yes Pain Score: 9  Pain Location: Back Pain Orientation: Right   Exercise/Treatments Stretches Active Hamstring Stretch: 3 reps;Limitations;20 seconds (15") Active Hamstring Stretch Limitations: Supine with rope Press Ups: 4 reps;Limitations Press Ups Limitations: 15" Quad Stretch: 3 reps;20 seconds;Limitations Quad Stretch Limitations: Prone with rope Piriformis Stretch: 3 reps;20 seconds;Limitations Piriformis Stretch Limitations: Seated Standing Other Standing Lumbar Exercises: Gastroc Stretch, Slantboard, 20" x3 Other Standing Lumbar Exercises: 3D Hip Excursion x10 Supine Ab Set: 5 seconds;10 reps  Modalities Modalities: Cryotherapy Cryotherapy Number Minutes Cryotherapy: 5 Minutes Cryotherapy Location: Back Type of Cryotherapy: Ice pack  Physical Therapy Assessment and Plan PT Assessment and Plan Clinical Impression Statement: Attempted HS stretch in standing on 14 -> 12 -> 8 inch box, though pt was unable to tolerate it in standing. Transitioned exercise to supine with better tolerance to 20 seconds.   Progressed stretching program today to include gastroc and quad stretch without any increases in pain.  Ended treatment session with ice secondary to severe complains of pain today, and educated pt on use of ice/heat at home for pain management (though pt cautioned with use of heat secondary to MS symptoms).   Pt will benefit from skilled therapeutic intervention in order to improve on the following deficits: Abnormal gait;Increased fascial restricitons;Decreased mobility;Decreased activity tolerance;Decreased range of motion;Increased muscle spasms;Improper body  mechanics;Improper spinal/pelvic alignment;Decreased balance;Impaired flexibility;Obesity;Decreased strength;Pain;Impaired sensation;Difficulty walking PT Plan: Progress stretching exercises to tolerance.  Add core/hip/trunk stability exercises, emphasizing correct alignment.      Goals PT Short Term Goals PT Short Term Goal 2: Patient will demonstrate a negative piriformi test and increas hip internal rotation to >25 degrees to increase shock absorbtion during gait.  PT Short Term Goal 2 - Progress: Progressing toward goal PT Short Term Goal 3: patient will demosntrate increased lumbar spine extnsion to >30 degrees to be able to lean back to look up at cieling PT Short Term Goal 3 - Progress: Progressing toward goal PT Long Term Goals PT Long Term Goal 1: Patient will demonstrate a negative 90/90 hamstring test to be able to touch toes.  PT Long Term Goal 1 - Progress: Progressing toward goal PT Long Term Goal 2: Patient will demosntrate increased ankle dorsiflexion mobility > 15 degrees to decrease early heel rise during gait PT Long Term Goal 2 - Progress: Progressing toward goal Long Term Goal 3: Patient will demosntrate negative ely's test  and negative thomas test indicating increased quadraceps/hipflexor mobility to be able to stnad more errect durign gait Long Term Goal 3 Progress: Progressing toward goal  Problem List Patient Active Problem List   Diagnosis Date Noted  . Lumbago 02/10/2014  . Chronic anxiety 07/08/2013  . Swelling of breast 04/08/2013  . Chronic pain syndrome 03/11/2013  . Port catheter in place 12/31/2012  . Unspecified constipation 10/01/2012  . Anemia 10/01/2012  . Rectal bleeding 10/01/2012  . Elevated alkaline phosphatase level 10/01/2012  . MS (multiple sclerosis) 09/02/2012  . DVT (deep venous thrombosis) 09/02/2012  . ANEMIA 04/12/2010  . NAUSEA WITH VOMITING 04/12/2010  . OTHER DYSPHAGIA 04/12/2010    PT - End of Session Activity Tolerance:  Patient tolerated treatment well;Patient limited by fatigue General Behavior During Therapy: [facility] for  tasks assessed/performed  GP    Melinda Moore 02/23/2014, 4:05 PM

## 2014-02-24 ENCOUNTER — Ambulatory Visit: Payer: 59 | Admitting: Family Medicine

## 2014-02-24 ENCOUNTER — Other Ambulatory Visit: Payer: Self-pay | Admitting: Family Medicine

## 2014-02-25 ENCOUNTER — Ambulatory Visit (HOSPITAL_COMMUNITY): Payer: 59

## 2014-02-25 ENCOUNTER — Telehealth (HOSPITAL_COMMUNITY): Payer: Self-pay

## 2014-02-25 NOTE — Telephone Encounter (Signed)
At Hospital with her daughter and can not come in today

## 2014-03-02 ENCOUNTER — Ambulatory Visit (HOSPITAL_COMMUNITY): Payer: 59 | Admitting: Physical Therapy

## 2014-03-04 ENCOUNTER — Ambulatory Visit (HOSPITAL_COMMUNITY): Payer: 59

## 2014-03-09 ENCOUNTER — Ambulatory Visit (HOSPITAL_COMMUNITY): Payer: 59 | Admitting: Physical Therapy

## 2014-03-11 ENCOUNTER — Ambulatory Visit (HOSPITAL_COMMUNITY): Payer: 59 | Admitting: Physical Therapy

## 2014-03-16 ENCOUNTER — Ambulatory Visit (HOSPITAL_COMMUNITY): Payer: 59

## 2014-03-18 ENCOUNTER — Ambulatory Visit (HOSPITAL_COMMUNITY): Payer: 59

## 2014-03-23 ENCOUNTER — Ambulatory Visit (HOSPITAL_COMMUNITY): Payer: 59 | Admitting: Physical Therapy

## 2014-03-25 ENCOUNTER — Ambulatory Visit (HOSPITAL_COMMUNITY): Payer: 59 | Admitting: Physical Therapy

## 2014-03-31 ENCOUNTER — Telehealth: Payer: Self-pay | Admitting: Family Medicine

## 2014-03-31 MED ORDER — HYDROMORPHONE HCL 4 MG PO TABS: 4.0000 mg | ORAL_TABLET | Freq: Two times a day (BID) | ORAL | Status: DC | PRN

## 2014-03-31 NOTE — Telephone Encounter (Signed)
Rx up front for patient pick up. Patient notified. 

## 2014-03-31 NOTE — Addendum Note (Signed)
Encounter addended by: Doyne Keel, PT on: 03/31/2014  6:34 PM<BR>     Documentation filed: Letters, Follow-up Section, Episodes, Notes Section

## 2014-03-31 NOTE — Telephone Encounter (Signed)
Pt states she will have to cancel her pain med appt for Monday due to her daughter being in  Active labor and will most likely (according to the pt) be at the hospital with her daughter on  Monday.  Wants to know if you will give her a refill one more time an she will call back to make an appt  First of Nov sometime   HYDROmorphone (DILAUDID) 4 MG tablet

## 2014-03-31 NOTE — Progress Notes (Addendum)
  Patient Details  Name: Melinda Moore MRN: [id number] Date of Birth: 1970-11-23  Today's Date: 03/31/2014  Patient discharged due to not returning to therapy since 02/23/14  Doyne Keel 03/31/2014, 6:31 PM

## 2014-03-31 NOTE — Telephone Encounter (Signed)
May have prescription for 60 tablets of her medicine. Must followup in mid November.

## 2014-04-05 ENCOUNTER — Ambulatory Visit: Payer: 59 | Admitting: Family Medicine

## 2014-04-27 ENCOUNTER — Ambulatory Visit: Payer: 59 | Admitting: Family Medicine

## 2014-04-27 DIAGNOSIS — Z029 Encounter for administrative examinations, unspecified: Secondary | ICD-10-CM

## 2014-04-28 ENCOUNTER — Ambulatory Visit (INDEPENDENT_AMBULATORY_CARE_PROVIDER_SITE_OTHER): Payer: Medicare Other | Admitting: Family Medicine

## 2014-04-28 ENCOUNTER — Encounter: Payer: Self-pay | Admitting: Family Medicine

## 2014-04-28 VITALS — BP 152/90 | Ht 69.0 in | Wt 303.0 lb

## 2014-04-28 DIAGNOSIS — G44029 Chronic cluster headache, not intractable: Secondary | ICD-10-CM

## 2014-04-28 MED ORDER — HYDROMORPHONE HCL 4 MG PO TABS: 4.0000 mg | ORAL_TABLET | Freq: Two times a day (BID) | ORAL | Status: DC | PRN

## 2014-04-28 NOTE — Progress Notes (Signed)
   Subjective:    Patient ID: Melinda Moore, female    DOB: 12-23-1970, 43 y.o.   MRN: [id number]  Headache  This is a new problem. Episode onset: 3 weeks ago. The problem occurs intermittently. The problem has been unchanged. The pain is located in the right unilateral and frontal region. The quality of the pain is described as dull and aching. Associated symptoms include eye pain. Pertinent negatives include no coughing or fever. Exacerbated by: stress. She has tried NSAIDs (lying down, sleeping) for the symptoms. The treatment provided mild relief.    PMH denies severe neck pain denies numbness tingling denies unilateral numbness weakness  Review of Systems  Constitutional: Negative for fever and chills.  HENT: Negative for congestion.   Eyes: Positive for pain.  Respiratory: Negative for cough and shortness of breath.   Cardiovascular: Negative for chest pain.  Neurological: Positive for headaches.       Objective:   Physical Exam  Constitutional: She appears well-nourished. No distress.  HENT:  Head: Normocephalic and atraumatic.  Neck: Normal range of motion. Neck supple.  Cardiovascular: Normal rate, regular rhythm and normal heart sounds.   No murmur heard. Pulmonary/Chest: Effort normal and breath sounds normal. No respiratory distress.  Musculoskeletal: She exhibits no edema.  Lymphadenopathy:    She has no cervical adenopathy.  Neurological: She is alert. She exhibits normal muscle tone.  Psychiatric: Her behavior is normal.  Vitals reviewed.         Assessment & Plan:  #1 headaches increase Tegretol to 3 times daily #2 MS follow-up with specialist #3 chronic pain prescription given follow-up within 4 weeks for formal chronic pain visit. She missed her last one

## 2014-05-01 ENCOUNTER — Other Ambulatory Visit: Payer: Self-pay | Admitting: *Deleted

## 2014-05-01 MED ORDER — CARBAMAZEPINE ER 200 MG PO TB12: 200.0000 mg | ORAL_TABLET | Freq: Three times a day (TID) | ORAL | Status: DC | PRN

## 2014-05-27 ENCOUNTER — Encounter: Payer: Self-pay | Admitting: Family Medicine

## 2014-05-27 ENCOUNTER — Ambulatory Visit (INDEPENDENT_AMBULATORY_CARE_PROVIDER_SITE_OTHER): Payer: 59 | Admitting: Family Medicine

## 2014-05-27 VITALS — BP 122/68 | Ht 69.0 in | Wt 298.0 lb

## 2014-05-27 DIAGNOSIS — M545 Low back pain, unspecified: Secondary | ICD-10-CM

## 2014-05-27 DIAGNOSIS — G894 Chronic pain syndrome: Secondary | ICD-10-CM

## 2014-05-27 MED ORDER — METHYLPHENIDATE HCL ER (LA) 30 MG PO CP24: 30.0000 mg | ORAL_CAPSULE | Freq: Every day | ORAL | Status: DC

## 2014-05-27 MED ORDER — OXYCODONE-ACETAMINOPHEN 7.5-325 MG PO TABS: 1.0000 | ORAL_TABLET | Freq: Three times a day (TID) | ORAL | Status: DC | PRN

## 2014-05-27 NOTE — Patient Instructions (Signed)

## 2014-05-27 NOTE — Progress Notes (Signed)
   Subjective:    Patient ID: Melinda Moore, female    DOB: 1970-12-14, 43 y.o.   MRN: [id number]  HPI This patient was seen today for chronic pain  The medication list was reviewed and updated.   -Compliance with pain medication: Yes  The patient was advised the importance of maintaining medication and not using illegal substances with these.  Refills needed: Yes  The patient was educated that we can provide 3 monthly scripts for their medication, it is their responsibility to follow the instructions.  Side effects or complications from medications: Would like to discuss a less stronger pain med d/t taking care of her grand daughter.   Patient is aware that pain medications are meant to minimize the severity of the pain to allow their pain levels to improve to allow for better function. They are aware of that pain medications cannot totally remove their pain.  Due for UDT ( at least once per year) : next visit  New home with steps   Takes med for leg and lower back, has hx herniated disk and MS/ neuropathy   Review of Systems  Constitutional: Negative for activity change, appetite change and fatigue.  Gastrointestinal: Negative for abdominal pain.  Neurological: Negative for headaches.  Psychiatric/Behavioral: Negative for behavioral problems.       Objective:   Physical Exam  Constitutional: She appears well-nourished. No distress.  Cardiovascular: Normal rate, regular rhythm and normal heart sounds.   No murmur heard. Pulmonary/Chest: Effort normal and breath sounds normal. No respiratory distress.  Musculoskeletal: She exhibits no edema.  Lymphadenopathy:    She has no cervical adenopathy.  Neurological: She is alert. She exhibits normal muscle tone.  Psychiatric: Her behavior is normal.  Vitals reviewed.         Assessment & Plan:  To do labs   Decrease strength of pain meds-change to Percocet 7.5 mg no greater than 3 and a day hopefully 60 will last for a  full month. She will let us know how things are going. Based on this we can extend a couple more prescriptions I would like for this patient to follow-up in 3 months

## 2014-06-08 DIAGNOSIS — G35 Multiple sclerosis: Secondary | ICD-10-CM | POA: Diagnosis not present

## 2014-06-24 ENCOUNTER — Ambulatory Visit: Payer: Medicare Other | Admitting: Family Medicine

## 2014-07-12 ENCOUNTER — Telehealth: Payer: Self-pay | Admitting: Family Medicine

## 2014-07-12 NOTE — Telephone Encounter (Signed)
Patient says that the oxycodone 7.5 mg that she received last month is not quite enough. She would like to go up to 10 mg.

## 2014-07-12 NOTE — Telephone Encounter (Signed)
Pt states she was told to call back if pain med not strong enough and that it could be increase. taking 7.5/325 one bid

## 2014-07-12 NOTE — Telephone Encounter (Signed)
She may have a prescription for oxycodone 10 mg/325, #60, 1 twice a day when necessary, please verify with patient and she uses pain medicine twice daily. Patient should follow-up in a proximally 4 weeks.

## 2014-07-13 MED ORDER — OXYCODONE-ACETAMINOPHEN 10-325 MG PO TABS: 1.0000 | ORAL_TABLET | Freq: Two times a day (BID) | ORAL | Status: DC | PRN

## 2014-07-13 NOTE — Telephone Encounter (Signed)
Pt notified and transferred up front to schedule an appt with Dr. Lorin Picket

## 2014-07-23 ENCOUNTER — Telehealth: Payer: Self-pay | Admitting: *Deleted

## 2014-07-23 NOTE — Telephone Encounter (Signed)
[facility] flushed. They no longer do that. [facility] now does that. They have never flushed her cath before. If you want them to do that, they need an order faxed to [facility] at [phone]. They need to know how often to flush it.

## 2014-07-23 NOTE — Telephone Encounter (Signed)
Nurses, I truly do not manage Port-A-Caths. Please find out who put in her Port-A-Cath. Also why was this put in. Is it still and use? If not still in use has patient considered having it removed? Am happy to help out or possibly arrange someone to help her but I need more information thank you

## 2014-07-27 NOTE — Telephone Encounter (Signed)
Pt states Dr. Katrinka Blazing put in port a cath years ago. She uses it for IV steroids for her MS.

## 2014-07-27 NOTE — Telephone Encounter (Signed)
NTC Dr Lovell Sheehan, speak with his office nurse regarding flushes of port a cath ( amount-frequency etc)

## 2014-07-28 NOTE — Telephone Encounter (Signed)
Dr. Lovell Sheehan office does not flush port-a-caths. They send all patients to the hospital. They are unsure of the amount/frequency since they do not do that there.

## 2014-07-28 NOTE — Telephone Encounter (Signed)
Please talk with oncology. They have seen the patient before she is had her Port-A-Cath flushed at the specialty center apparently they don't do that there anymore. Find out from them how often they recommend flushing these ports and what do they use. Certainly somebody can tell us how to take care of Port-A-Cath if week and get that information we can save this patient from having to go see the general surgeon or who ever thank you

## 2014-07-28 NOTE — Telephone Encounter (Addendum)
[facility] is handling port-a-caths now they just need order for them to take it over per patient Patient was getting this done approx every 6 weeks till fall of this year per chart and this was there procedure per chart:     Melinda Moore presented for Portacath access and flush. Proper placement of portacath confirmed by CXR. Portacath located mid to left chest wall accessed with H 20 needle. Good blood return present. Portacath flushed with 62ml NS and 500U/72ml Heparin and needle removed intact. Procedure without incident. Patient tolerated procedure well.

## 2014-08-03 ENCOUNTER — Emergency Department (HOSPITAL_COMMUNITY)
Admission: EM | Admit: 2014-08-03 | Discharge: 2014-08-03 | Disposition: A | Discharge status: Home / Self Care | Payer: Medicare Other | Attending: Emergency Medicine | Admitting: Emergency Medicine

## 2014-08-03 ENCOUNTER — Encounter (HOSPITAL_COMMUNITY): Payer: Self-pay | Admitting: Emergency Medicine

## 2014-08-03 ENCOUNTER — Emergency Department (HOSPITAL_COMMUNITY): Payer: Medicare Other

## 2014-08-03 DIAGNOSIS — Z9889 Other specified postprocedural states: Secondary | ICD-10-CM | POA: Insufficient documentation

## 2014-08-03 DIAGNOSIS — F419 Anxiety disorder, unspecified: Secondary | ICD-10-CM | POA: Diagnosis not present

## 2014-08-03 DIAGNOSIS — R0789 Other chest pain: Secondary | ICD-10-CM

## 2014-08-03 DIAGNOSIS — E876 Hypokalemia: Secondary | ICD-10-CM | POA: Insufficient documentation

## 2014-08-03 DIAGNOSIS — Z86718 Personal history of other venous thrombosis and embolism: Secondary | ICD-10-CM | POA: Diagnosis not present

## 2014-08-03 DIAGNOSIS — I4510 Unspecified right bundle-branch block: Secondary | ICD-10-CM | POA: Diagnosis not present

## 2014-08-03 DIAGNOSIS — Z79899 Other long term (current) drug therapy: Secondary | ICD-10-CM | POA: Insufficient documentation

## 2014-08-03 DIAGNOSIS — Z862 Personal history of diseases of the blood and blood-forming organs and certain disorders involving the immune mechanism: Secondary | ICD-10-CM | POA: Diagnosis not present

## 2014-08-03 DIAGNOSIS — F329 Major depressive disorder, single episode, unspecified: Secondary | ICD-10-CM | POA: Insufficient documentation

## 2014-08-03 DIAGNOSIS — I10 Essential (primary) hypertension: Secondary | ICD-10-CM | POA: Insufficient documentation

## 2014-08-03 DIAGNOSIS — Z8669 Personal history of other diseases of the nervous system and sense organs: Secondary | ICD-10-CM | POA: Diagnosis not present

## 2014-08-03 DIAGNOSIS — R079 Chest pain, unspecified: Secondary | ICD-10-CM | POA: Diagnosis present

## 2014-08-03 LAB — CBC WITH DIFFERENTIAL/PLATELET
Basophils Absolute: 0 10*3/uL (ref 0.0–0.1)
Basophils Relative: 0 % (ref 0–1)
Eosinophils Absolute: 0.1 10*3/uL (ref 0.0–0.7)
Eosinophils Relative: 2 % (ref 0–5)
HCT: 32.8 % — ABNORMAL LOW (ref 36.0–46.0)
Hemoglobin: 10.6 g/dL — ABNORMAL LOW (ref 12.0–15.0)
LYMPHS PCT: 20 % (ref 12–46)
Lymphs Abs: 1.3 10*3/uL (ref 0.7–4.0)
MCH: 26.7 pg (ref 26.0–34.0)
MCHC: 32.3 g/dL (ref 30.0–36.0)
MCV: 82.6 fL (ref 78.0–100.0)
Monocytes Absolute: 0.4 10*3/uL (ref 0.1–1.0)
Monocytes Relative: 6 % (ref 3–12)
NEUTROS PCT: 72 % (ref 43–77)
Neutro Abs: 4.8 10*3/uL (ref 1.7–7.7)
PLATELETS: 399 10*3/uL (ref 150–400)
RBC: 3.97 MIL/uL (ref 3.87–5.11)
RDW: 15.8 % — AB (ref 11.5–15.5)
WBC: 6.6 10*3/uL (ref 4.0–10.5)

## 2014-08-03 LAB — COMPREHENSIVE METABOLIC PANEL
ALK PHOS: 151 U/L — AB (ref 39–117)
ALT: 23 U/L (ref 0–35)
AST: 17 U/L (ref 0–37)
Albumin: 3.4 g/dL — ABNORMAL LOW (ref 3.5–5.2)
Anion gap: 10 (ref 5–15)
BUN: 7 mg/dL (ref 6–23)
CO2: 29 mmol/L (ref 19–32)
Calcium: 8.5 mg/dL (ref 8.4–10.5)
Chloride: 98 mmol/L (ref 96–112)
Creatinine, Ser: 0.79 mg/dL (ref 0.50–1.10)
GFR calc Af Amer: >90 mL/min (ref >90)
GFR calc non Af Amer: >90 mL/min (ref >90)
GLUCOSE: 104 mg/dL — AB (ref 70–99)
POTASSIUM: 3.2 mmol/L — AB (ref 3.5–5.1)
SODIUM: 137 mmol/L (ref 135–145)
TOTAL PROTEIN: 7.1 g/dL (ref 6.0–8.3)
Total Bilirubin: 0.3 mg/dL (ref 0.3–1.2)

## 2014-08-03 LAB — D-DIMER, QUANTITATIVE (NOT AT ARMC): D DIMER QUANT: 0.29 ug{FEU}/mL (ref 0.00–0.48)

## 2014-08-03 LAB — TROPONIN I: Troponin I: <0.03 ng/mL (ref <0.031)

## 2014-08-03 MED ORDER — ASPIRIN 81 MG PO CHEW
324.0000 mg | CHEWABLE_TABLET | Freq: Once | ORAL | Status: AC
  Administered 2014-08-03: 324 mg via ORAL
  Filled 2014-08-03: qty 4

## 2014-08-03 MED ORDER — POTASSIUM CHLORIDE CRYS ER 20 MEQ PO TBCR: 20.0000 meq | EXTENDED_RELEASE_TABLET | Freq: Two times a day (BID) | ORAL | Status: DC

## 2014-08-03 MED ORDER — KETOROLAC TROMETHAMINE 30 MG/ML IJ SOLN
30.0000 mg | Freq: Once | INTRAMUSCULAR | Status: AC
  Administered 2014-08-03: 30 mg via INTRAVENOUS
  Filled 2014-08-03: qty 1

## 2014-08-03 MED ORDER — NITROGLYCERIN 2 % TD OINT
1.0000 [in_us] | TOPICAL_OINTMENT | Freq: Once | TRANSDERMAL | Status: AC
  Administered 2014-08-03: 1 [in_us] via TOPICAL
  Filled 2014-08-03: qty 1

## 2014-08-03 MED ORDER — NAPROXEN 500 MG PO TABS: ORAL_TABLET | ORAL | Status: DC

## 2014-08-03 MED ORDER — ACETAMINOPHEN 500 MG PO TABS
1000.0000 mg | ORAL_TABLET | Freq: Once | ORAL | Status: AC
  Administered 2014-08-03: 1000 mg via ORAL
  Filled 2014-08-03: qty 2

## 2014-08-03 MED ORDER — CYCLOBENZAPRINE HCL 5 MG PO TABS
5.0000 mg | ORAL_TABLET | Freq: Three times a day (TID) | ORAL | Status: DC | PRN
Start: 2014-08-03 — End: 2014-08-09

## 2014-08-03 NOTE — Discharge Instructions (Signed)
Try ice and heat on your chest for comfort. Take the medications as prescribed. Recheck if you get a fever, struggle to breathe or feel worse.     Chest Wall Pain Chest wall pain is pain felt in or around the chest bones and muscles. It may take up to 6 weeks to get better. It may take longer if you are active. Chest wall pain can happen on its own. Other times, things like germs, injury, coughing, or exercise can cause the pain. HOME CARE   Avoid activities that make you tired or cause pain. Try not to use your chest, belly (abdominal), or side muscles. Do not use heavy weights.  Put ice on the sore area.  Put ice in a plastic bag.  Place a towel between your skin and the bag.  Leave the ice on for 15-20 minutes for the first 2 days.  Only take medicine as told by your doctor. GET HELP RIGHT AWAY IF:   You have more pain or are very uncomfortable.  You have a fever.  Your chest pain gets worse.  You have new problems.  You feel sick to your stomach (nauseous) or throw up (vomit).  You start to sweat or feel lightheaded.  You have a cough with mucus (phlegm).  You cough up blood. MAKE SURE YOU:   Understand these instructions.  Will watch your condition.  Will get help right away if you are not doing well or get worse. Document Released: 11/14/2007 Document Revised: 08/20/2011 Document Reviewed: 01/22/2011 [facility] Patient Information 2015 [place], [place]. This information is not intended to replace advice given to you by your health care provider. Make sure you discuss any questions you have with your health care provider.

## 2014-08-03 NOTE — ED Notes (Signed)
Patient c/o left sided chest pain with shortness of breath.  Patient also c/o lower back pain that got worse when chest pain started.

## 2014-08-03 NOTE — ED Provider Notes (Signed)
CSN: [id number]     Arrival date & time 08/03/14  0247 History   First MD Initiated Contact with Patient 08/03/14 0303     Chief Complaint  Patient presents with  . Chest Pain     (Consider location/radiation/quality/duration/timing/severity/associated sxs/prior Treatment) HPI  Patient states about 9 PM she started getting a discomfort in her left chest that got worse about 11 PM. She states she started getting the pain a few weeks ago and has had it a few times. She states before when she took aspirin it helped. She states tonight the pain has been waxing and waning and she describes it as a pressure sensation. She states it radiates into her left leg and her left arm felt numb earlier however when she shook it the numbness went away. She has mild shortness of breath and mild nausea. She states she's had some sweating however that is chronic. She states nothing she does makes the pain hurt more, however pushing or putting pressure on her chest makes it feel better. Patient has MS and states she just has some minor numbness in her feet. She states she is not bedridden and she ambulates during the day. She reports chronic swelling of her legs that she takes fluid pills for. She denies any calf pain. She has not sought medical treatment for this pain before.  Patient states there is strong family history of heart disease in her father's family. She states her father had a MI 6, he had open heart surgery, and he finally died of congestive heart failure.  PCP Dr Gerda Diss  Past Medical History  Diagnosis Date  . Bell's palsy   . MS (multiple sclerosis) 1997  . DVT (deep venous thrombosis) 09/02/2012    [place] on 02/13/2012 showed acute DVT in left calf veins and posterior tibial veins  . Anemia   . Leukopenia   . Anxiety   . Depression   . Peripheral edema   . HTN (hypertension)   . Port catheter in place 12/31/2012   Past Surgical History  Procedure Laterality Date  . Abdominal hysterectomy    .  Cholecystectomy    . Tubal ligation  2001  . Esophagogastroduodenoscopy  2011    Dr. Jena Gauss. Possible cervical esophageal with status post disruption with passage of Maloney dilator, glycol esophageal erythema, antral erosions. Biopsy from the stomach revealed minimal chronic inactive inflammation but negative for H. pylori   Family History  Problem Relation Age of Onset  . Heart disease Father   . Hyperlipidemia Father   . Hypertension Sister   . Other Paternal Uncle     Possible colon cancer, age greater than 13  . Cirrhosis Brother     etoh   History  Substance Use Topics  . Smoking status: Never Smoker   . Smokeless tobacco: Never Used  . Alcohol Use: No   On disability Lives at home Lives with spouse  OB History    No data available     Review of Systems  All other systems reviewed and are negative.     Allergies  Ambien and Tape  Home Medications   Prior to Admission medications   Medication Sig Start Date End Date Taking? Authorizing Provider  carbamazepine (TEGRETOL XR) 200 MG 12 hr tablet Take 1 tablet (200 mg total) by mouth 3 (three) times daily as needed. 05/01/14  Yes Merlyn Albert, MD  diazepam (VALIUM) 5 MG tablet Take 5 mg by mouth daily as needed for muscle  spasms. For anxiety   Yes Historical Provider, MD  Fingolimod HCl (GILENYA) 0.5 MG CAPS Take 1 capsule by mouth daily.     Yes Historical Provider, MD  gabapentin (NEURONTIN) 100 MG capsule TAKE TWO CAPSULES IN THE MORNING, THEN TWO AT NOON, THEN TWO AT SUPPER, THEN THREE AT BEDTIME 02/24/14  Yes Babs Sciara, MD  metaxalone (SKELAXIN) 800 MG tablet Take 1 tablet (800 mg total) by mouth 3 (three) times daily as needed for muscle spasms. 02/08/14 02/08/15 Yes Babs Sciara, MD  methylphenidate (RITALIN LA) 30 MG 24 hr capsule Take 1 capsule (30 mg total) by mouth daily. 05/27/14 05/27/15 Yes Babs Sciara, MD  Multiple Vitamin (MULTIVITAMIN) tablet Take 1 tablet by mouth daily.   Yes Historical  Provider, MD  oxyCODONE-acetaminophen (PERCOCET) 10-325 MG per tablet Take 1 tablet by mouth 2 (two) times daily as needed for pain. 07/13/14  Yes Babs Sciara, MD  Polyethylene Glycol 400 (BLINK TEARS) 0.25 % SOLN Apply 1 drop to eye at bedtime.     Yes Historical Provider, MD  potassium chloride (K-DUR) 10 MEQ tablet Take 10 mEq by mouth daily.   Yes Historical Provider, MD  torsemide (DEMADEX) 20 MG tablet TAKE TWO TABLETS IN THE MORNING AND ONE AT NOON IF NEEDED.   Yes Babs Sciara, MD  traZODone (DESYREL) 50 MG tablet Take 0.5-1 tablets (25-50 mg total) by mouth at bedtime as needed for sleep. 02/08/14  Yes Babs Sciara, MD  Vitamins A & D (VITAMIN A & D PO) Take 4 tablets by mouth daily. Vit A 5000 IU and Vit D 400 IU x 4 tablets daily   Yes Historical Provider, MD  Vitamins C E (VITAMIN C & E COMBINATION) 500-400 MG-UNIT CAPS Take 2 tablets by mouth.   Yes Historical Provider, MD  HYDROmorphone (DILAUDID) 4 MG tablet Take 1 tablet (4 mg total) by mouth 2 (two) times daily as needed for severe pain. 04/28/14   Babs Sciara, MD   BP 132/78 mmHg  Pulse 94  Temp(Src) 98.4 F (36.9 C) (Oral)  Resp 20  Ht 5\' 10"  (1.778 m)  Wt 280 lb (127.007 kg)  BMI 40.18 kg/m2  SpO2 98%  Vital signs normal   Physical Exam  Constitutional: She is oriented to person, place, and time. She appears well-developed and well-nourished.  Non-toxic appearance. She does not appear ill. No distress.  HENT:  Head: Normocephalic and atraumatic.  Right Ear: External ear normal.  Left Ear: External ear normal.  Nose: Nose normal. No mucosal edema or rhinorrhea.  Mouth/Throat: Oropharynx is clear and moist and mucous membranes are normal. No dental abscesses or uvula swelling.  Eyes: Conjunctivae and EOM are normal. Pupils are equal, round, and reactive to light.  Neck: Normal range of motion and full passive range of motion without pain. Neck supple.  Cardiovascular: Normal rate, regular rhythm and normal  heart sounds.  Exam reveals no gallop and no friction rub.   No murmur heard. Pulmonary/Chest: Effort normal and breath sounds normal. No respiratory distress. She has no wheezes. She has no rhonchi. She has no rales. She exhibits tenderness. She exhibits no crepitus.    Has mild tenderness to palpation in her left chest as shown.  Abdominal: Soft. Normal appearance and bowel sounds are normal. She exhibits no distension. There is no tenderness. There is no rebound and no guarding.  Musculoskeletal: Normal range of motion. She exhibits no edema or tenderness.  Moves all extremities  well.   Neurological: She is alert and oriented to person, place, and time. She has normal strength. No cranial nerve deficit.  Skin: Skin is warm, dry and intact. No rash noted. No erythema. No pallor.  Psychiatric: She has a normal mood and affect. Her speech is normal and behavior is normal. Her mood appears not anxious.  Nursing note and vitals reviewed.   ED Course  Procedures (including critical care time)  Medications  ketorolac (TORADOL) 30 MG/ML injection 30 mg (30 mg Intravenous Given 08/03/14 0416)  aspirin chewable tablet 324 mg (324 mg Oral Given 08/03/14 0417)  nitroGLYCERIN (NITROGLYN) 2 % ointment 1 inch (1 inch Topical Given 08/03/14 0418)  acetaminophen (TYLENOL) tablet 1,000 mg (1,000 mg Oral Given 08/03/14 0417)    We discussed patient's EKG. There were subtle changes of incomplete right bundle on her EKG however it was many years ago.    At discharge patient was given her test results. She states her pain is much improved. At this point patient was felt stable for discharge. Her pain is very atypical for cardiac chest pain despite having a strong family history of heart problems.   Labs Review Results for orders placed or performed during the hospital encounter of 08/03/14  Comprehensive metabolic panel  Result Value Ref Range   Sodium 137 135 - 145 mmol/L   Potassium 3.2 (L) 3.5 - 5.1  mmol/L   Chloride 98 96 - 112 mmol/L   CO2 29 19 - 32 mmol/L   Glucose, Bld 104 (H) 70 - 99 mg/dL   BUN 7 6 - 23 mg/dL   Creatinine, Ser 1.61 0.50 - 1.10 mg/dL   Calcium 8.5 8.4 - 09.6 mg/dL   Total Protein 7.1 6.0 - 8.3 g/dL   Albumin 3.4 (L) 3.5 - 5.2 g/dL   AST 17 0 - 37 U/L   ALT 23 0 - 35 U/L   Alkaline Phosphatase 151 (H) 39 - 117 U/L   Total Bilirubin 0.3 0.3 - 1.2 mg/dL   GFR calc non Af Amer >90 >90 mL/min   GFR calc Af Amer >90 >90 mL/min   Anion gap 10 5 - 15  CBC with Differential  Result Value Ref Range   WBC 6.6 4.0 - 10.5 K/uL   RBC 3.97 3.87 - 5.11 MIL/uL   Hemoglobin 10.6 (L) 12.0 - 15.0 g/dL   HCT 04.5 (L) 40.9 - 81.1 %   MCV 82.6 78.0 - 100.0 fL   MCH 26.7 26.0 - 34.0 pg   MCHC 32.3 30.0 - 36.0 g/dL   RDW 91.4 (H) 78.2 - 95.6 %   Platelets 399 150 - 400 K/uL   Neutrophils Relative % 72 43 - 77 %   Neutro Abs 4.8 1.7 - 7.7 K/uL   Lymphocytes Relative 20 12 - 46 %   Lymphs Abs 1.3 0.7 - 4.0 K/uL   Monocytes Relative 6 3 - 12 %   Monocytes Absolute 0.4 0.1 - 1.0 K/uL   Eosinophils Relative 2 0 - 5 %   Eosinophils Absolute 0.1 0.0 - 0.7 K/uL   Basophils Relative 0 0 - 1 %   Basophils Absolute 0.0 0.0 - 0.1 K/uL  Troponin I  Result Value Ref Range   Troponin I <0.03 <0.031 ng/mL  D-dimer, quantitative  Result Value Ref Range   D-Dimer, Quant 0.29 0.00 - 0.48 ug/mL-FEU   Laboratory interpretation all normal except for chronic hypokalemia and stable anemia     Imaging Review Dg Chest  2 View  08/03/2014   CLINICAL DATA:  Central chest pain for 1 day.  EXAM: CHEST  2 VIEW  COMPARISON:  None.  FINDINGS: Left chest port with tip in the mid SVC. Heart size and mediastinal contours are normal. The lungs are clear. There is no consolidation, pleural effusion, or pneumothorax. Anterior chest wall is not entirely included on the lateral view. No acute osseous abnormalities.  IMPRESSION: No acute pulmonary process.   Electronically Signed   By: Rubye Oaks  M.D.   On: 08/03/2014 05:30     EKG Interpretation   Date/Time:  Tuesday August 03 2014 03:07:46 EST Ventricular Rate:  93 PR Interval:  142 QRS Duration: 145 QT Interval:  420 QTC Calculation: 522 R Axis:   21 Text Interpretation:  Sinus rhythm Right bundle branch block Baseline  wander in lead(s) V4 Since last tracing 02 Jan 2000 has developed RBBB  Reconfirmed by [facility]  MD-I, Derric Dealmeida ([id number]) on 08/03/2014 6:57:10 AM      MDM   Final diagnoses:  Atypical chest pain  Hypokalemia  Right bundle branch block    New Prescriptions   CYCLOBENZAPRINE (FLEXERIL) 5 MG TABLET    Take 1 tablet (5 mg total) by mouth 3 (three) times daily as needed (for muscle soreness).   NAPROXEN (NAPROSYN) 500 MG TABLET    Take 1 po BID with food prn pain   POTASSIUM CHLORIDE SA (K-DUR,KLOR-CON) 20 MEQ TABLET    Take 1 tablet (20 mEq total) by mouth 2 (two) times daily.    Plan discharge   Devoria Albe, MD, Franz Dell, MD 08/03/14 [id number]

## 2014-08-09 ENCOUNTER — Ambulatory Visit (INDEPENDENT_AMBULATORY_CARE_PROVIDER_SITE_OTHER): Payer: 59 | Admitting: Family Medicine

## 2014-08-09 ENCOUNTER — Encounter: Payer: Self-pay | Admitting: Family Medicine

## 2014-08-09 VITALS — BP 120/82 | Ht 69.0 in | Wt 286.2 lb

## 2014-08-09 DIAGNOSIS — G894 Chronic pain syndrome: Secondary | ICD-10-CM

## 2014-08-09 DIAGNOSIS — E876 Hypokalemia: Secondary | ICD-10-CM

## 2014-08-09 DIAGNOSIS — R739 Hyperglycemia, unspecified: Secondary | ICD-10-CM

## 2014-08-09 DIAGNOSIS — G35 Multiple sclerosis: Secondary | ICD-10-CM | POA: Diagnosis not present

## 2014-08-09 DIAGNOSIS — D508 Other iron deficiency anemias: Secondary | ICD-10-CM | POA: Diagnosis not present

## 2014-08-09 MED ORDER — TRAMADOL HCL 50 MG PO TABS: 50.0000 mg | ORAL_TABLET | Freq: Three times a day (TID) | ORAL | Status: DC | PRN

## 2014-08-09 MED ORDER — METHYLPHENIDATE HCL ER (LA) 30 MG PO CP24: 30.0000 mg | ORAL_CAPSULE | Freq: Every day | ORAL | Status: DC

## 2014-08-09 MED ORDER — CYCLOBENZAPRINE HCL 5 MG PO TABS: ORAL_TABLET | ORAL | Status: DC

## 2014-08-09 MED ORDER — POTASSIUM CHLORIDE CRYS ER 20 MEQ PO TBCR: 20.0000 meq | EXTENDED_RELEASE_TABLET | Freq: Two times a day (BID) | ORAL | Status: DC

## 2014-08-09 MED ORDER — METHYLPHENIDATE HCL ER (LA) 30 MG PO CP24
30.0000 mg | ORAL_CAPSULE | Freq: Every day | ORAL | Status: DC
Start: 2014-08-09 — End: 2015-06-27

## 2014-08-09 MED ORDER — MELOXICAM 15 MG PO TABS: 15.0000 mg | ORAL_TABLET | Freq: Every day | ORAL | Status: DC

## 2014-08-09 NOTE — Progress Notes (Signed)
   Subjective:    Patient ID: Melinda Moore, female    DOB: 1970/11/04, 44 y.o.   MRN: [id number]  HPI This patient was seen today for chronic pain  The medication list was reviewed and updated.   -Compliance with pain medication: yes  The patient was advised the importance of maintaining medication and not using illegal substances with these.  Refills needed: yes  The patient was educated that we can provide 3 monthly scripts for their medication, it is their responsibility to follow the instructions.  Side effects or complications from medications: none  Patient is aware that pain medications are meant to minimize the severity of the pain to allow their pain levels to improve to allow for better function. They are aware of that pain medications cannot totally remove their pain.  Due for UDT ( at least once per year) : This year  Patient states that she has no concerns at this time.   Recently went to the ER because of fatigue chest muscle soreness pain and discomfort. They gave her anti-inflammatory they also gave her a muscle relaxer seemed to help her.   She described off and on soreness in the chest muscles on the left side. Denies substernal pressure.  Review of Systems  Constitutional: Negative for activity change, appetite change and fatigue.  HENT: Negative for congestion.   Respiratory: Negative for cough.   Cardiovascular: Negative for chest pain.  Gastrointestinal: Negative for abdominal pain.  Endocrine: Negative for polydipsia and polyphagia.  Neurological: Negative for weakness.  Psychiatric/Behavioral: Negative for confusion.       Objective:   Physical Exam  Constitutional: She appears well-nourished. No distress.  Cardiovascular: Normal rate, regular rhythm and normal heart sounds.   No murmur heard. Pulmonary/Chest: Effort normal and breath sounds normal. No respiratory distress.  Musculoskeletal: She exhibits no edema.  Lymphadenopathy:    She has no  cervical adenopathy.  Neurological: She is alert. She exhibits normal muscle tone.  Psychiatric: Her behavior is normal.  Vitals reviewed.   Subjective discomfort in her lower back negative straight leg raise      Assessment & Plan:  1. Chronic pain syndrome This patient would like to get off on narcotics. She doesn't feel that they help her. We talked about how we could just stop prescribing those we will use a milder pain medicine. Tramadol. One 3 times a day when necessary caution drowsiness she will let us know of that's not helping her  2. Hypokalemia She will take her potassium twice per day I believe her hypokalemia is related to her diuretic she will check a metabolic 7 in a few weeks time - Basic metabolic panel  3. MS (multiple sclerosis) She sees her MS specialist on a regular basis they prescribed Ritalin because of severe lack of energy during the day and it seemed to help her greatly we went ahead and gave her 3 refills today and encouraging her to not exceed one per day  4. Other iron deficiency anemias She does have iron deficiency and anemia we will check a ferritin hemoglobin hematocrit. - Ferritin - Hemoglobin - Hematocrit  5. Hyperglycemia History hyperglycemia check A1c - Hemoglobin A1c  Chest pain resolved muscle inflammation Flexeril when necessary she requests something different the Naprosyn we will try meloxicam. She states Naprosyn did not do enough for the chest wall pain.

## 2014-08-09 NOTE — Patient Instructions (Signed)

## 2014-08-11 ENCOUNTER — Telehealth: Payer: Self-pay | Admitting: Family Medicine

## 2014-08-11 NOTE — Telephone Encounter (Signed)
Pt is requesting a prescription for leness to be called in. Pt states That Dr. Lorin Picket recommended it.

## 2014-08-12 ENCOUNTER — Ambulatory Visit: Payer: Medicare Other | Admitting: Nurse Practitioner

## 2014-08-12 MED ORDER — LINACLOTIDE 145 MCG PO CAPS: 145.0000 ug | ORAL_CAPSULE | Freq: Every day | ORAL | Status: DC

## 2014-08-12 NOTE — Telephone Encounter (Signed)
[facility] 3/3 (med sent)

## 2014-08-12 NOTE — Telephone Encounter (Signed)
This patient suffers from chronic idiopathic constipation along with constipation complicated from medications and MS. She may have a prescription for Linzess 145 g 1 daily. #30, 4 refills. It is possible that this will be rejected by insurance company. Certainly it can be appealed but I cannot guarantee its coverage.

## 2014-08-17 ENCOUNTER — Encounter (HOSPITAL_COMMUNITY)
Admission: RE | Admit: 2014-08-17 | Discharge: 2014-08-17 | Disposition: A | Discharge status: Home / Self Care | Payer: 59 | Source: Ambulatory Visit | Attending: Family Medicine | Admitting: Family Medicine

## 2014-08-17 NOTE — Telephone Encounter (Signed)
Order has been faxed to short stay for port a cath flush.

## 2014-08-17 NOTE — Telephone Encounter (Signed)
Please write out the order that [facility] needs in regards to the procedure and frequency. I will be happy to sign it then it can be sent to [facility]. Thank you

## 2014-08-19 ENCOUNTER — Encounter (HOSPITAL_COMMUNITY)
Admission: RE | Admit: 2014-08-19 | Discharge: 2014-08-19 | Disposition: A | Discharge status: Home / Self Care | Payer: 59 | Source: Ambulatory Visit | Attending: Family Medicine | Admitting: Family Medicine

## 2014-08-19 DIAGNOSIS — Z452 Encounter for adjustment and management of vascular access device: Secondary | ICD-10-CM | POA: Insufficient documentation

## 2014-08-19 MED ORDER — HEPARIN SOD (PORK) LOCK FLUSH 100 UNIT/ML IV SOLN: 500.0000 [IU] | INTRAVENOUS | Status: DC | PRN

## 2014-08-19 MED ORDER — HEPARIN SOD (PORK) LOCK FLUSH 100 UNIT/ML IV SOLN
INTRAVENOUS | Status: AC
  Filled 2014-08-19: qty 5

## 2014-08-19 MED ORDER — SODIUM CHLORIDE 0.9 % IJ SOLN
10.0000 mL | INTRAMUSCULAR | Status: DC | PRN
  Filled 2014-08-19: qty 3

## 2014-08-19 NOTE — Progress Notes (Signed)
Port a cath flushed without difficulty.

## 2014-09-30 ENCOUNTER — Encounter (HOSPITAL_COMMUNITY): Admission: RE | Admit: 2014-09-30 | Payer: Medicare Other | Source: Ambulatory Visit

## 2014-09-30 MED ORDER — HEPARIN SOD (PORK) LOCK FLUSH 10 UNIT/ML IV SOLN: 10.0000 [IU] | Freq: Once | INTRAVENOUS | Status: DC

## 2014-10-05 ENCOUNTER — Encounter (HOSPITAL_COMMUNITY): Admission: RE | Admit: 2014-10-05 | Payer: Medicare Other | Source: Ambulatory Visit

## 2014-10-06 ENCOUNTER — Encounter (HOSPITAL_COMMUNITY)
Admission: RE | Admit: 2014-10-06 | Discharge: 2014-10-06 | Disposition: A | Discharge status: Home / Self Care | Payer: 59 | Source: Ambulatory Visit | Attending: Family Medicine | Admitting: Family Medicine

## 2014-10-06 DIAGNOSIS — G35 Multiple sclerosis: Secondary | ICD-10-CM | POA: Insufficient documentation

## 2014-10-06 MED ORDER — HEPARIN SOD (PORK) LOCK FLUSH 100 UNIT/ML IV SOLN
INTRAVENOUS | Status: AC
  Filled 2014-10-06: qty 5

## 2014-10-06 MED ORDER — HEPARIN SOD (PORK) LOCK FLUSH 100 UNIT/ML IV SOLN
500.0000 [IU] | Freq: Once | INTRAVENOUS | Status: AC
  Administered 2014-10-06: 500 [IU] via INTRAVENOUS

## 2014-10-06 MED ORDER — SODIUM CHLORIDE 0.9 % IJ SOLN
10.0000 mL | Freq: Once | INTRAMUSCULAR | Status: AC
  Administered 2014-10-06: 10 mL via INTRAVENOUS
  Filled 2014-10-06: qty 3

## 2014-11-01 ENCOUNTER — Ambulatory Visit: Payer: Medicare Other | Admitting: Family Medicine

## 2014-11-02 ENCOUNTER — Ambulatory Visit: Payer: 59 | Admitting: Family Medicine

## 2014-11-02 DIAGNOSIS — E876 Hypokalemia: Secondary | ICD-10-CM | POA: Diagnosis not present

## 2014-11-02 DIAGNOSIS — R7309 Other abnormal glucose: Secondary | ICD-10-CM | POA: Diagnosis not present

## 2014-11-02 DIAGNOSIS — D508 Other iron deficiency anemias: Secondary | ICD-10-CM | POA: Diagnosis not present

## 2014-11-02 DIAGNOSIS — R739 Hyperglycemia, unspecified: Secondary | ICD-10-CM | POA: Diagnosis not present

## 2014-11-03 ENCOUNTER — Other Ambulatory Visit: Payer: Self-pay | Admitting: Family Medicine

## 2014-11-03 ENCOUNTER — Ambulatory Visit: Payer: 59 | Admitting: Family Medicine

## 2014-11-03 LAB — HEMATOCRIT: Hematocrit: 35.3 % (ref 34.0–46.6)

## 2014-11-03 LAB — FERRITIN: Ferritin: 64 ng/mL (ref 15–150)

## 2014-11-03 LAB — BASIC METABOLIC PANEL
BUN / CREAT RATIO: 13 (ref 9–23)
BUN: 9 mg/dL (ref 6–24)
CO2: 24 mmol/L (ref 18–29)
Calcium: 9.1 mg/dL (ref 8.7–10.2)
Chloride: 99 mmol/L (ref 97–108)
Creatinine, Ser: 0.71 mg/dL (ref 0.57–1.00)
GFR calc non Af Amer: 105 mL/min/{1.73_m2} (ref >59)
GFR, EST AFRICAN AMERICAN: 121 mL/min/{1.73_m2} (ref >59)
GLUCOSE: 86 mg/dL (ref 65–99)
Potassium: 4.2 mmol/L (ref 3.5–5.2)
Sodium: 139 mmol/L (ref 134–144)

## 2014-11-03 LAB — HEMOGLOBIN A1C
Est. average glucose Bld gHb Est-mCnc: 131 mg/dL
Hgb A1c MFr Bld: 6.2 % — ABNORMAL HIGH (ref 4.8–5.6)

## 2014-11-03 LAB — HEMOGLOBIN: HEMOGLOBIN: 11.4 g/dL (ref 11.1–15.9)

## 2014-11-03 NOTE — Telephone Encounter (Signed)
Needs office visit.

## 2014-11-04 ENCOUNTER — Other Ambulatory Visit: Payer: Self-pay

## 2014-11-04 MED ORDER — CARBAMAZEPINE ER 200 MG PO TB12: ORAL_TABLET | ORAL | Status: DC

## 2014-11-18 ENCOUNTER — Encounter: Payer: Self-pay | Admitting: Family Medicine

## 2014-11-18 ENCOUNTER — Ambulatory Visit (INDEPENDENT_AMBULATORY_CARE_PROVIDER_SITE_OTHER): Payer: 59 | Admitting: Family Medicine

## 2014-11-18 VITALS — BP 112/78 | Ht 69.0 in | Wt 273.0 lb

## 2014-11-18 DIAGNOSIS — G8929 Other chronic pain: Secondary | ICD-10-CM

## 2014-11-18 DIAGNOSIS — M25561 Pain in right knee: Secondary | ICD-10-CM

## 2014-11-18 DIAGNOSIS — G894 Chronic pain syndrome: Secondary | ICD-10-CM

## 2014-11-18 DIAGNOSIS — M545 Low back pain: Secondary | ICD-10-CM

## 2014-11-18 MED ORDER — DICLOFENAC SODIUM 75 MG PO TBEC: 75.0000 mg | DELAYED_RELEASE_TABLET | Freq: Two times a day (BID) | ORAL | Status: DC

## 2014-11-18 NOTE — Progress Notes (Signed)
   Subjective:    Patient ID: Melinda Moore, female    DOB: Apr 02, 1971, 44 y.o.   MRN: [id number]  HPI This patient was seen today for chronic pain  The medication list was reviewed and updated.   -Compliance with pain medication: yes  The patient was advised the importance of maintaining medication and not using illegal substances with these.  Refills needed: yes  The patient was educated that we can provide 3 monthly scripts for their medication, it is their responsibility to follow the instructions.  Side effects or complications from medications: none  Patient is aware that pain medications are meant to minimize the severity of the pain to allow their pain levels to improve to allow for better function. They are aware of that pain medications cannot totally remove their pain.  Due for UDT ( at least once per year) : Not indicated for tramadol  Patient states she has no other concerns at this time.  Lower back and right inner knee Constant pain Feet and toes with some numbness neurontin helps     Review of Systems Low back pain discomfort does not radiate down the leg but does have some right knee pain    Objective:   Physical Exam  Subjected discomfort in the lower back subjective discomfort in the right knee lungs clear heart regular      Assessment & Plan:  Lumbar pain Right knee pain Chronic-states tramadol really did not help much. Does not want to take strong narcotics.  She would like to try diclofenac twice a day when necessary she will follow-up in 3 months we'll recheck A1c at that time

## 2014-11-18 NOTE — Patient Instructions (Signed)
As part of your visit today we have covered your chronic pain. You have been given prescription(s) for pain medicines.The DEA and State Medical Board require that any patient on pain medications must be seen every 3 months. You are expected to come in for a office visit before further pain medications are issued.  Since we are managing your pain do not get pain scripts from other doctors. We check the prescription registry regularly. If you are receiving pain medicines from another source we will STOP prescribing pain medicines.   We will not refill medications or early nor will we give an extended month supply at the end of these prescriptions.It is your responsibility to keep up with medications. They will not be replaced.  It is your responsibility to schedule an office visit in 3-4 months to be seen before you are out of your medication. Do not call our office to request early refills or additional refills. Do not wait till the last moment to schedule the follow up visit. We highly recommend you schedule this now for 3 months.  We believe that most patients take their meds as prescribed but drug misuse and diversion is a serious problem in the [place]. Our office does standard measures to insure proper care to all. All patients are subject to random urine drug screens/ saliva tests and random pill counts. Also all patients drug prescription records are reviewed on a regular basis in accordance with State medical board policies.  Remember, do not use alcohol or illegal drugs with your pain medications.    We are required by law to adhere to strict regulations. Failure on our part to follow these regulations could jeopardize our prescription license which in turn would cause us not to be able to care for you.Thank you for your understanding and following these policies. 

## 2014-11-24 ENCOUNTER — Encounter (HOSPITAL_COMMUNITY)
Admission: RE | Admit: 2014-11-24 | Discharge: 2014-11-24 | Disposition: A | Discharge status: Home / Self Care | Payer: Medicare Other | Source: Ambulatory Visit | Attending: Family Medicine | Admitting: Family Medicine

## 2014-12-15 DIAGNOSIS — R4189 Other symptoms and signs involving cognitive functions and awareness: Secondary | ICD-10-CM | POA: Diagnosis not present

## 2014-12-15 DIAGNOSIS — G35 Multiple sclerosis: Secondary | ICD-10-CM | POA: Diagnosis not present

## 2014-12-15 DIAGNOSIS — R52 Pain, unspecified: Secondary | ICD-10-CM | POA: Diagnosis not present

## 2014-12-15 DIAGNOSIS — F419 Anxiety disorder, unspecified: Secondary | ICD-10-CM | POA: Diagnosis not present

## 2015-01-21 ENCOUNTER — Other Ambulatory Visit: Payer: Self-pay | Admitting: Family Medicine

## 2015-01-21 NOTE — Telephone Encounter (Signed)
Okay this miss +2 refills

## 2015-02-18 ENCOUNTER — Ambulatory Visit (INDEPENDENT_AMBULATORY_CARE_PROVIDER_SITE_OTHER): Payer: Commercial Managed Care - HMO | Admitting: Family Medicine

## 2015-02-18 ENCOUNTER — Encounter: Payer: Self-pay | Admitting: Family Medicine

## 2015-02-18 VITALS — BP 130/82 | Ht 69.0 in | Wt 271.8 lb

## 2015-02-18 DIAGNOSIS — M791 Myalgia: Secondary | ICD-10-CM | POA: Diagnosis not present

## 2015-02-18 DIAGNOSIS — M609 Myositis, unspecified: Secondary | ICD-10-CM

## 2015-02-18 DIAGNOSIS — M2550 Pain in unspecified joint: Secondary | ICD-10-CM | POA: Diagnosis not present

## 2015-02-18 DIAGNOSIS — IMO0001 Reserved for inherently not codable concepts without codable children: Secondary | ICD-10-CM

## 2015-02-18 MED ORDER — HYDROCODONE-ACETAMINOPHEN 5-325 MG PO TABS: 1.0000 | ORAL_TABLET | Freq: Four times a day (QID) | ORAL | Status: DC | PRN

## 2015-02-18 NOTE — Progress Notes (Signed)
   Subjective:    Patient ID: Melinda Moore, female    DOB: 04-08-1971, 44 y.o.   MRN: [id number]  HPI  This patient was seen today for chronic pain  The medication list was reviewed and updated.   -Compliance with pain medication: yes  The patient was advised the importance of maintaining medication and not using illegal substances with these.  Refills needed: yes  The patient was educated that we can provide 3 monthly scripts for their medication, it is their responsibility to follow the instructions.  Side effects or complications from medications: none  Patient is aware that pain medications are meant to minimize the severity of the pain to allow their pain levels to improve to allow for better function. They are aware of that pain medications cannot totally remove their pain.  Due for UDT ( at least once per year) : not indicated for tramadol       Review of Systems She relates muscle aches joint pains feeling achy all over tired fatigue she states tramadol is not helping the pain she would like to have something stronger but not too strong    Objective:   Physical Exam Lungs are clear hearts regular her joints are moving well reflexes are good strength seems normal but she has subjective discomfort in her muscles as well as her joints she feels that this is due to MS she states she's had this multiple times before       Assessment & Plan:  I encouraged patient to use pain medication sparingly that should take the edge off the pain but will not totally remove it hydrocodone every 4 hours when necessary she is to call us in 2 weeks to give [place] updates regarding how her pain is doing  We need to make sure that we are not overlooking underlying condition and not just blaming this on in this I would recommend patient have testing to rule out possibility of lupus or other connective tissue disease  Probable follow-up 3 months sooner problems

## 2015-03-27 ENCOUNTER — Encounter: Payer: Self-pay | Admitting: Family Medicine

## 2015-03-31 MED ORDER — GABAPENTIN 100 MG PO CAPS: 100.0000 mg | ORAL_CAPSULE | Freq: Three times a day (TID) | ORAL | Status: DC

## 2015-03-31 MED ORDER — HYDROCODONE-ACETAMINOPHEN 7.5-325 MG PO TABS: ORAL_TABLET | ORAL | Status: DC

## 2015-03-31 NOTE — Addendum Note (Signed)
Addended by: Jeralene Peters on: 03/31/2015 10:48 AM   Modules accepted: Orders

## 2015-03-31 NOTE — Telephone Encounter (Signed)
Nurse's-please connect with the patient. #1 she does not need paper orders. They are in the system. Instructed her to go to [facility] they will be able to draw the necessary tests #2 I recommend increasing Neurontin 300 mg 1 capsule 3 times a day, #90, 5 refills #3 you may gave her prescription of hydrocodone 7.5/325 #60 no greater than 1 twice daily #4 patient should follow-up within 3-4 weeks

## 2015-04-05 ENCOUNTER — Encounter (HOSPITAL_COMMUNITY): Admission: RE | Admit: 2015-04-05 | Payer: Medicare Other | Source: Ambulatory Visit

## 2015-04-11 ENCOUNTER — Encounter (HOSPITAL_COMMUNITY)
Admission: RE | Admit: 2015-04-11 | Discharge: 2015-04-11 | Disposition: A | Discharge status: Home / Self Care | Payer: Medicare Other | Source: Ambulatory Visit | Attending: Family Medicine | Admitting: Family Medicine

## 2015-04-19 ENCOUNTER — Encounter (HOSPITAL_COMMUNITY)
Admission: RE | Admit: 2015-04-19 | Discharge: 2015-04-19 | Disposition: A | Discharge status: Home / Self Care | Payer: Medicare Other | Source: Ambulatory Visit | Attending: Family Medicine | Admitting: Family Medicine

## 2015-04-19 ENCOUNTER — Encounter (HOSPITAL_COMMUNITY): Admission: RE | Admit: 2015-04-19 | Payer: Medicare Other | Source: Ambulatory Visit

## 2015-04-19 ENCOUNTER — Encounter (HOSPITAL_COMMUNITY): Payer: Self-pay

## 2015-04-19 DIAGNOSIS — Z452 Encounter for adjustment and management of vascular access device: Secondary | ICD-10-CM | POA: Diagnosis not present

## 2015-04-19 DIAGNOSIS — G35 Multiple sclerosis: Secondary | ICD-10-CM | POA: Insufficient documentation

## 2015-04-19 MED ORDER — HEPARIN SOD (PORK) LOCK FLUSH 100 UNIT/ML IV SOLN
500.0000 [IU] | INTRAVENOUS | Status: AC | PRN
  Administered 2015-04-19: 500 [IU]

## 2015-04-19 MED ORDER — SODIUM CHLORIDE 0.9 % IJ SOLN
10.0000 mL | INTRAMUSCULAR | Status: AC | PRN
  Administered 2015-04-19: 10 mL

## 2015-04-19 MED ORDER — HEPARIN SOD (PORK) LOCK FLUSH 100 UNIT/ML IV SOLN
INTRAVENOUS | Status: AC
  Filled 2015-04-19: qty 5

## 2015-04-19 NOTE — Progress Notes (Signed)
Patient arrived today for port a cath flush, after sterile technique and chlora prep, port which is in the center of her chest was accessed without difficult, blood return noted and flushed with heparin easily. Appointment made  for 05/31/2015

## 2015-04-22 ENCOUNTER — Encounter (HOSPITAL_COMMUNITY): Payer: Medicare Other

## 2015-05-09 ENCOUNTER — Telehealth: Payer: Self-pay | Admitting: Family Medicine

## 2015-05-09 NOTE — Telephone Encounter (Signed)
Patient states needs to speak with you on pain medication vicodin  5/325 not liking.

## 2015-05-09 NOTE — Telephone Encounter (Signed)
TCNA 

## 2015-05-09 NOTE — Telephone Encounter (Signed)
LMRC to get more info 

## 2015-05-11 ENCOUNTER — Other Ambulatory Visit: Payer: Self-pay | Admitting: *Deleted

## 2015-05-11 MED ORDER — OXYCODONE-ACETAMINOPHEN 5-325 MG PO TABS: 1.0000 | ORAL_TABLET | Freq: Two times a day (BID) | ORAL | Status: DC | PRN

## 2015-05-11 NOTE — Telephone Encounter (Signed)
Pt taking hydrocodone 7.5/325 bid and it is not helping with pain. She takes for MS. Has pain all over. She states she was looking back at her pain journal and her pain was better managed when she was on oxycodone. She states she took this back in February.

## 2015-05-11 NOTE — Telephone Encounter (Signed)
#  1 cancel hydrocodone No. 2 oxycodone-Percocet 5/325, 1 twice a day when necessary pain, #60, patient will need follow-up visit in December-importance for patient drill eyes that pain medication with MS will only take the edge off the pain it will not totally alleviate the pain also important to realize that large doses of narcotic pain medicines for MS is not recommended

## 2015-05-11 NOTE — Telephone Encounter (Signed)
Discussed with pt. rx ready for pickup.

## 2015-05-19 DIAGNOSIS — M791 Myalgia: Secondary | ICD-10-CM | POA: Diagnosis not present

## 2015-05-19 DIAGNOSIS — M609 Myositis, unspecified: Secondary | ICD-10-CM | POA: Diagnosis not present

## 2015-05-19 DIAGNOSIS — M2550 Pain in unspecified joint: Secondary | ICD-10-CM | POA: Diagnosis not present

## 2015-05-20 ENCOUNTER — Telehealth: Payer: Self-pay | Admitting: Family Medicine

## 2015-05-20 DIAGNOSIS — R700 Elevated erythrocyte sedimentation rate: Secondary | ICD-10-CM

## 2015-05-20 LAB — ANA: Anti Nuclear Antibody(ANA): NEGATIVE

## 2015-05-20 LAB — C-REACTIVE PROTEIN: CRP: 12.3 mg/L — AB (ref 0.0–4.9)

## 2015-05-20 LAB — SEDIMENTATION RATE: SED RATE: 57 mm/h — AB (ref 0–32)

## 2015-05-20 LAB — RHEUMATOID FACTOR: Rhuematoid fact SerPl-aCnc: <10 IU/mL (ref 0.0–13.9)

## 2015-05-20 NOTE — Telephone Encounter (Signed)
Notified patient Melinda Moore pending, sed rate elevated, rheum test negative,because sed rate is up Dr. Lorin Picket highly recommend ref to rheumatology for further eval. Patient verbalized understanding. Referral in system.

## 2015-05-20 NOTE — Telephone Encounter (Signed)
Patient called wanting results of labs done yesterday.

## 2015-05-20 NOTE — Telephone Encounter (Signed)
Rettie pending, sed rate elevated, rheum test negative,bcz sed rate is up I highly recommend ref to rheumatology for further ecval- set referral in motion

## 2015-05-20 NOTE — Telephone Encounter (Signed)
Still awaiting Ahna other results are under labs

## 2015-05-23 ENCOUNTER — Encounter: Payer: Self-pay | Admitting: Family Medicine

## 2015-05-24 ENCOUNTER — Ambulatory Visit (INDEPENDENT_AMBULATORY_CARE_PROVIDER_SITE_OTHER): Payer: Medicare Other | Admitting: Family Medicine

## 2015-05-24 ENCOUNTER — Encounter: Payer: Self-pay | Admitting: Family Medicine

## 2015-05-24 VITALS — BP 130/74 | Ht 69.0 in | Wt 283.0 lb

## 2015-05-24 DIAGNOSIS — M545 Low back pain, unspecified: Secondary | ICD-10-CM

## 2015-05-24 DIAGNOSIS — Z23 Encounter for immunization: Secondary | ICD-10-CM

## 2015-05-24 DIAGNOSIS — G8929 Other chronic pain: Secondary | ICD-10-CM

## 2015-05-24 DIAGNOSIS — G894 Chronic pain syndrome: Secondary | ICD-10-CM

## 2015-05-24 DIAGNOSIS — G35 Multiple sclerosis: Secondary | ICD-10-CM | POA: Diagnosis not present

## 2015-05-24 MED ORDER — GABAPENTIN 300 MG PO CAPS: 300.0000 mg | ORAL_CAPSULE | Freq: Three times a day (TID) | ORAL | Status: DC

## 2015-05-24 MED ORDER — OXYCODONE-ACETAMINOPHEN 10-325 MG PO TABS: 1.0000 | ORAL_TABLET | Freq: Three times a day (TID) | ORAL | Status: DC | PRN

## 2015-05-24 NOTE — Progress Notes (Signed)
   Subjective:    Patient ID: Melinda Moore, female    DOB: 1970/07/17, 44 y.o.   MRN: [id number]  HPI This patient was seen today for chronic pain  The medication list was reviewed and updated.   -Compliance with medication: yes  - Number patient states they take daily:  2 daily   -when was the last dose patient took: this morning   The patient was advised the importance of maintaining medication and not using illegal substances with these.  Refills needed: yes  The patient was educated that we can provide 3 monthly scripts for their medication, it is their responsibility to follow the instructions.  Side effects or complications from medications: none  Patient is aware that pain medications are meant to minimize the severity of the pain to allow their pain levels to improve to allow for better function. They are aware of that pain medications cannot totally remove their pain.  Due for UDT ( at least once per year) : utd  Patient has no concerns at this time.      Review of Systems  Constitutional: Negative for activity change and appetite change.  Gastrointestinal: Negative for vomiting and abdominal pain.  Neurological: Negative for weakness.  Psychiatric/Behavioral: Negative for confusion.       Objective:   Physical Exam  Constitutional: She appears well-nourished. No distress.  HENT:  Head: Normocephalic.  Cardiovascular: Normal rate, regular rhythm and normal heart sounds.   No murmur heard. Pulmonary/Chest: Effort normal and breath sounds normal.  Musculoskeletal: She exhibits no edema.  Lymphadenopathy:    She has no cervical adenopathy.  Neurological: She is alert.  Psychiatric: Her behavior is normal.  Vitals reviewed.         Assessment & Plan:  The patient was seen today as part of a comprehensive visit regarding pain control. Patient's compliance with the medication as well as discussion regarding effectiveness was completed. Prescriptions were  written. Patient was advised to follow-up in 3 months. The patient was assessed for any signs of severe side effects. The patient was advised to take the medicine as directed and to report to [place] if any side effect issues.  The patient was counseled not to take muscle relaxers at all with pain medicine especially at bedtime.I recommend that the patient no longer use Flexeril.  We will try oxycodone 10 mg see how that goes with helping the patient with her pain caution drowsiness do not use more than 3 per day prescription given she will give [place] feedback when one month's time on how this is going. If medication going well then can issue to additional scripts and have the patient follow-up in approximately 3 months

## 2015-05-24 NOTE — Patient Instructions (Signed)

## 2015-05-31 ENCOUNTER — Encounter (HOSPITAL_COMMUNITY)
Admission: RE | Admit: 2015-05-31 | Discharge: 2015-05-31 | Disposition: A | Discharge status: Home / Self Care | Payer: Medicare Other | Source: Ambulatory Visit | Attending: Family Medicine | Admitting: Family Medicine

## 2015-05-31 NOTE — Progress Notes (Signed)
"  no show" for scheduled port flush. Unable to reach by phone--no voice mail.

## 2015-06-07 ENCOUNTER — Encounter (HOSPITAL_COMMUNITY)
Admission: RE | Admit: 2015-06-07 | Discharge: 2015-06-07 | Disposition: A | Discharge status: Home / Self Care | Payer: Medicare Other | Source: Ambulatory Visit | Attending: Family Medicine | Admitting: Family Medicine

## 2015-06-07 DIAGNOSIS — G35 Multiple sclerosis: Secondary | ICD-10-CM | POA: Diagnosis not present

## 2015-06-07 MED ORDER — SODIUM CHLORIDE 0.9 % IJ SOLN
10.0000 mL | INTRAMUSCULAR | Status: AC | PRN
  Administered 2015-06-07: 10 mL

## 2015-06-07 MED ORDER — HEPARIN SOD (PORK) LOCK FLUSH 100 UNIT/ML IV SOLN
INTRAVENOUS | Status: AC
  Filled 2015-06-07: qty 5

## 2015-06-07 MED ORDER — HEPARIN SOD (PORK) LOCK FLUSH 100 UNIT/ML IV SOLN
500.0000 [IU] | INTRAVENOUS | Status: AC | PRN
  Administered 2015-06-07: 500 [IU]

## 2015-06-24 ENCOUNTER — Encounter: Payer: Self-pay | Admitting: Family Medicine

## 2015-06-26 NOTE — Telephone Encounter (Signed)
The nurses will take care of printing the prescriptions. I will sign them. They will call you so you can come pick them up.  Nurse's-please issue 2 prescriptions of pain medicines, oxycodone 10 mg/3 25-1 3 times daily-#90, may do a second prescription that can be filled in 30 days  Also Ritalin 30 mg refill. 1 daily. #30, 2 separate prescriptions one that can be filled now the other one in 30 days.  Finally it is important that patient follows up for chronic pain visit in 2 months

## 2015-06-27 MED ORDER — METHYLPHENIDATE HCL ER (LA) 30 MG PO CP24: 30.0000 mg | ORAL_CAPSULE | Freq: Every day | ORAL | Status: DC

## 2015-06-27 MED ORDER — OXYCODONE-ACETAMINOPHEN 10-325 MG PO TABS: 1.0000 | ORAL_TABLET | Freq: Three times a day (TID) | ORAL | Status: DC | PRN

## 2015-06-27 NOTE — Addendum Note (Signed)
Addended by: Margaretha Sheffield on: 06/27/2015 10:48 AM   Modules accepted: Orders

## 2015-07-13 ENCOUNTER — Telehealth: Payer: Self-pay | Admitting: Family Medicine

## 2015-07-13 NOTE — Telephone Encounter (Signed)
Pt's METHYLPHENIDATE  is NOT covered by her prescription plan See paper chart for denial letter   Epic & paper chart was researched - no Dx to support this was found - please advise

## 2015-07-14 NOTE — Telephone Encounter (Signed)
LMRC

## 2015-07-14 NOTE — Telephone Encounter (Signed)
Please let the patient know that we are having difficulty getting her medication approved by the insurance company. My understanding is that the patient had this medication prescribed because of her MS to help her with severe fatigue and tiredness. Please discuss with the patient what that she were called the original reason for prescribing this medication. Fatigue and tiredness with MS? Or ADD related issues? Additional information from the patient will help [place] get her medication approved. Also try to find out from the patient has this medicine ever been recommended by any other specialists that she is seen in the past. Any additional information will help.

## 2015-07-15 NOTE — Telephone Encounter (Signed)
Patient states that she is going to her specialist that originally prescribed this medication soon for an appointment and she will get their office to prescribe it. She sends her thanks for [place] giving it a try though.

## 2015-07-25 DIAGNOSIS — E669 Obesity, unspecified: Secondary | ICD-10-CM | POA: Diagnosis not present

## 2015-07-25 DIAGNOSIS — R5381 Other malaise: Secondary | ICD-10-CM | POA: Diagnosis not present

## 2015-07-25 DIAGNOSIS — M25561 Pain in right knee: Secondary | ICD-10-CM | POA: Diagnosis not present

## 2015-07-25 DIAGNOSIS — R700 Elevated erythrocyte sedimentation rate: Secondary | ICD-10-CM | POA: Diagnosis not present

## 2015-08-02 ENCOUNTER — Encounter (HOSPITAL_COMMUNITY)
Admission: RE | Admit: 2015-08-02 | Discharge: 2015-08-02 | Disposition: A | Discharge status: Home / Self Care | Payer: Commercial Managed Care - HMO | Source: Ambulatory Visit | Attending: Family Medicine | Admitting: Family Medicine

## 2015-08-03 DIAGNOSIS — G35 Multiple sclerosis: Secondary | ICD-10-CM | POA: Diagnosis not present

## 2015-08-10 ENCOUNTER — Ambulatory Visit (INDEPENDENT_AMBULATORY_CARE_PROVIDER_SITE_OTHER): Payer: Commercial Managed Care - HMO | Admitting: Nurse Practitioner

## 2015-08-10 ENCOUNTER — Encounter: Payer: Self-pay | Admitting: Nurse Practitioner

## 2015-08-10 ENCOUNTER — Encounter (HOSPITAL_COMMUNITY)
Admission: RE | Admit: 2015-08-10 | Discharge: 2015-08-10 | Disposition: A | Discharge status: Home / Self Care | Payer: Commercial Managed Care - HMO | Source: Ambulatory Visit | Attending: Family Medicine | Admitting: Family Medicine

## 2015-08-10 VITALS — BP 124/76 | Ht 69.0 in | Wt 288.5 lb

## 2015-08-10 DIAGNOSIS — Z1231 Encounter for screening mammogram for malignant neoplasm of breast: Secondary | ICD-10-CM | POA: Diagnosis not present

## 2015-08-10 DIAGNOSIS — N760 Acute vaginitis: Secondary | ICD-10-CM | POA: Diagnosis not present

## 2015-08-10 DIAGNOSIS — Z139 Encounter for screening, unspecified: Secondary | ICD-10-CM | POA: Diagnosis not present

## 2015-08-10 DIAGNOSIS — G35 Multiple sclerosis: Secondary | ICD-10-CM | POA: Insufficient documentation

## 2015-08-10 DIAGNOSIS — Z0000 Encounter for general adult medical examination without abnormal findings: Secondary | ICD-10-CM

## 2015-08-10 DIAGNOSIS — Z01419 Encounter for gynecological examination (general) (routine) without abnormal findings: Secondary | ICD-10-CM

## 2015-08-10 LAB — POCT WET PREP WITH KOH
CLUE CELLS WET PREP PER HPF POC: NEGATIVE
KOH Prep POC: NEGATIVE
RBC WET PREP PER HPF POC: NEGATIVE
Trichomonas, UA: NEGATIVE
WBC Wet Prep HPF POC: NEGATIVE
YEAST WET PREP PER HPF POC: NEGATIVE
pH, Wet Prep: 4.5

## 2015-08-10 MED ORDER — HEPARIN SOD (PORK) LOCK FLUSH 100 UNIT/ML IV SOLN
500.0000 [IU] | INTRAVENOUS | Status: AC | PRN
  Administered 2015-08-10: 500 [IU]
  Filled 2015-08-10 (×2): qty 5

## 2015-08-10 MED ORDER — SODIUM CHLORIDE 0.9% FLUSH
10.0000 mL | INTRAVENOUS | Status: AC | PRN
  Administered 2015-08-10: 10 mL

## 2015-08-10 MED ORDER — PHENTERMINE-TOPIRAMATE ER 3.75-23 MG PO CP24: ORAL_CAPSULE | ORAL | Status: DC

## 2015-08-10 NOTE — Progress Notes (Signed)
Pt arrived to short stay clinic for PAC flush. Unsuccessful attempt to aspirate port x 2 RNs. Third attempt successful by Dina Rich, RN. Port flushed with 500 units heparinzed saline solution.

## 2015-08-12 ENCOUNTER — Encounter: Payer: Self-pay | Admitting: Nurse Practitioner

## 2015-08-12 NOTE — Progress Notes (Signed)
Subjective:    Patient ID: Melinda Moore, female    DOB: [birth year], 45 y.o.   MRN: [id number]  HPI presents for her wellness exam. No pelvic pain. Same sexual partner. Regular vision exams. Wears dentures. No oral lesions. Plans to start exercise regimen.     Review of Systems  Constitutional: Positive for fatigue. Negative for fever, activity change and appetite change.  HENT: Negative for ear pain, sinus pressure and sore throat.   Respiratory: Negative for cough, chest tightness, shortness of breath and wheezing.   Cardiovascular: Negative for chest pain.  Gastrointestinal: Negative for nausea, vomiting, abdominal pain, diarrhea, constipation and abdominal distention.  Genitourinary: Negative for dysuria, urgency, frequency, vaginal discharge, enuresis, difficulty urinating, genital sores and pelvic pain.       Objective:   Physical Exam  Constitutional: She is oriented to person, place, and time. She appears well-developed. No distress.  HENT:  Right Ear: External ear normal.  Left Ear: External ear normal.  Mouth/Throat: Oropharynx is clear and moist.  Neck: Normal range of motion. Neck supple. No tracheal deviation present. No thyromegaly present.  Cardiovascular: Normal rate, regular rhythm and normal heart sounds.  Exam reveals no gallop.   No murmur heard. Pulmonary/Chest: Effort normal and breath sounds normal.  Abdominal: Soft. She exhibits no distension. There is no tenderness.  Genitourinary: Vaginal discharge found.  External GU: no rashes or lesions. Vagina: slight white discharge. Bimanual exam: no tenderness or obvious masses; exam limited due to abd girth.   Musculoskeletal: She exhibits no edema.  Lymphadenopathy:    She has no cervical adenopathy.  Neurological: She is alert and oriented to person, place, and time.  Skin: Skin is warm and dry. No rash noted.  Psychiatric: She has a normal mood and affect. Her behavior is normal.  Vitals reviewed. Breast  exam: no masses; axillae no adenopathy.  Results for orders placed or performed in visit on 08/10/15  POCT Wet Prep with KOH  Result Value Ref Range   Trichomonas, UA Negative    Clue Cells Wet Prep HPF POC neg    Epithelial Wet Prep HPF POC Moderate Few, Moderate, Many, Too numerous to count   Yeast Wet Prep HPF POC neg    Bacteria Wet Prep HPF POC Few None, Few, Too numerous to count   RBC Wet Prep HPF POC neg    WBC Wet Prep HPF POC neg    KOH Prep POC Negative    pH, Wet Prep 4.5           Assessment & Plan:   Problem List Items Addressed This Visit      Other   Morbid obesity (HCC)   Relevant Medications   Phentermine-Topiramate (QSYMIA) 3.75-23 MG CP24    Other Visit Diagnoses    Well woman exam    -  Primary    Relevant Orders    VITAMIN D 25 Hydroxy (Vit-D Deficiency, Fractures)    Lipid panel    Vaginitis and vulvovaginitis        Relevant Orders    POCT Wet Prep with KOH (Completed)    Screening        Relevant Orders    VITAMIN D 25 Hydroxy (Vit-D Deficiency, Fractures)    Lipid panel    Visit for screening mammogram        Relevant Orders    MM SCREENING BREAST TOMO BILATERAL      Meds ordered this encounter  Medications  . Phentermine-Topiramate [facility])  3.75-23 MG CP24    Sig: One po qam x 14 days then 2 po qam    Dispense:  32 capsule    Refill:  0    Order Specific Question:  Supervising Provider    Answer:  Merlyn Albert [[id number]]   Start Qsymia as directed. DC med if any adverse effects.  Recommend healthy diet, weight loss and vitamin D/calcium supplementation. Return in about 3 months (around 11/10/2015) for recheck.

## 2015-08-18 ENCOUNTER — Ambulatory Visit (HOSPITAL_COMMUNITY): Payer: Medicare Other

## 2015-08-18 ENCOUNTER — Telehealth: Payer: Self-pay | Admitting: Family Medicine

## 2015-08-18 NOTE — Telephone Encounter (Signed)
Coupon up front for patient pick up. Patient notified.

## 2015-08-18 NOTE — Telephone Encounter (Signed)
If we still have coupons in the closet, please send her another one. The first one is for 2 weeks of free med.

## 2015-08-18 NOTE — Telephone Encounter (Signed)
Pt states she can not find the coupon for the qsymia  Pt would like to start on just the phentermine or which ever Other one you had recommended that you had talked about at her  Well visit.   Please advise    [facility]

## 2015-08-18 NOTE — Telephone Encounter (Signed)
Note for [employer]

## 2015-08-19 ENCOUNTER — Telehealth: Payer: Self-pay | Admitting: Nurse Practitioner

## 2015-08-19 ENCOUNTER — Ambulatory Visit (HOSPITAL_COMMUNITY): Payer: Medicare Other

## 2015-08-19 NOTE — Telephone Encounter (Signed)
Called pt and informed her that [facility] does sale qysmia.

## 2015-08-19 NOTE — Telephone Encounter (Signed)
noted 

## 2015-08-19 NOTE — Telephone Encounter (Signed)
Pt called stating that she tried to find the medicine that Eber Jones suggested and was unable to find anyone who carries it here in [place]. Pt would like to go ahead and get a prescription for the phentermine.      [facility]

## 2015-08-23 ENCOUNTER — Telehealth: Payer: Self-pay | Admitting: Family Medicine

## 2015-08-23 ENCOUNTER — Encounter: Payer: Self-pay | Admitting: Family Medicine

## 2015-08-23 ENCOUNTER — Ambulatory Visit (INDEPENDENT_AMBULATORY_CARE_PROVIDER_SITE_OTHER): Payer: Commercial Managed Care - HMO | Admitting: Family Medicine

## 2015-08-23 VITALS — BP 134/80 | Ht 69.0 in | Wt 291.1 lb

## 2015-08-23 DIAGNOSIS — G894 Chronic pain syndrome: Secondary | ICD-10-CM | POA: Diagnosis not present

## 2015-08-23 MED ORDER — METHYLPHENIDATE HCL ER (LA) 30 MG PO CP24: 30.0000 mg | ORAL_CAPSULE | Freq: Every day | ORAL | Status: DC

## 2015-08-23 MED ORDER — OXYCODONE-ACETAMINOPHEN 10-325 MG PO TABS: 1.0000 | ORAL_TABLET | Freq: Three times a day (TID) | ORAL | Status: DC | PRN

## 2015-08-23 NOTE — Progress Notes (Signed)
   Subjective:    Patient ID: Melinda Moore, female    DOB: 01-07-1971, 45 y.o.   MRN: [id number]  HPI This patient was seen today for chronic pain  The medication list was reviewed and updated.   -Compliance with medication: Takes daily.  - Number patient states they take daily: Takes 1 tablet daily.  -when was the last dose patient took? This morning 1000   The patient was advised the importance of maintaining medication and not using illegal substances with these.  Refills needed: Yes   The patient was educated that we can provide 3 monthly scripts for their medication, it is their responsibility to follow the instructions.  Side effects or complications from medications: None   Patient is aware that pain medications are meant to minimize the severity of the pain to allow their pain levels to improve to allow for better function. They are aware of that pain medications cannot totally remove their pain.  Due for UDT ( at least once per year) : N/A  Patient states no other concerns this visit.       Review of Systems  Constitutional: Negative for activity change and appetite change.  Gastrointestinal: Negative for vomiting and abdominal pain.  Neurological: Negative for weakness.  Psychiatric/Behavioral: Negative for confusion.       Objective:   Physical Exam  Constitutional: She appears well-nourished. No distress.  HENT:  Head: Normocephalic.  Cardiovascular: Normal rate, regular rhythm and normal heart sounds.   No murmur heard. Pulmonary/Chest: Effort normal and breath sounds normal.  Musculoskeletal: She exhibits no edema.  Lymphadenopathy:    She has no cervical adenopathy.  Neurological: She is alert.  Psychiatric: Her behavior is normal.  Vitals reviewed.   Patient has chronic pain in her legs from MS. Pain medication helps take the edge off makes it more tolerable.      Assessment & Plan:  The patient was seen today as part of a comprehensive visit  regarding pain control. Patient's compliance with the medication as well as discussion regarding effectiveness was completed. Prescriptions were written. Patient was advised to follow-up in 3 months. The patient was assessed for any signs of severe side effects. The patient was advised to take the medicine as directed and to report to [place] if any side effect issues.  The patient is very frustrated by her weight. Recent medication prescribed her I doubt that that will be beneficial patient states she cannot afford it. She is wanting to know we are going to prescribe Adipex. But she is already on Ritalin which is a similar medication. I do not recommend both.  Long discussion held on gastric surgery possible gastric sleeve she will look into see if this is potentially covered by her insurance she will let us know. We may need refer her for consultation

## 2015-08-23 NOTE — Telephone Encounter (Signed)
At the patient's visit just a few minutes ago we had a detailed discussion. The methylphenidate she is currently on for ADD purposes is a amphetamine which can also be a appetite suppressant. The patient is very frustrated that she is not losing weight but I feel her issue is much more complex than what a medication is going to take care of. If she desires to be on phenterimine she would need to return the prescriptions for methylphenidate in we would use phenterimine 37.5 mg 1 daily for 3 months. Then we can discuss how she is doing when she follows up.

## 2015-08-23 NOTE — Telephone Encounter (Signed)
Left message to return call 

## 2015-08-23 NOTE — Telephone Encounter (Signed)
Discussed with patient. Patient stated she will think about which one she wants to be on and then call back if she decides she wants to switch. Patient is aware that she will need to bring back the other rxs if she decides she wants to switch to adipex

## 2015-08-23 NOTE — Telephone Encounter (Signed)
States she can not afford the Qsymia  Would like to just go  To trying the reg phentermine please

## 2015-08-24 ENCOUNTER — Other Ambulatory Visit: Payer: Self-pay | Admitting: Nurse Practitioner

## 2015-08-24 ENCOUNTER — Ambulatory Visit (HOSPITAL_COMMUNITY)
Admission: RE | Admit: 2015-08-24 | Discharge: 2015-08-24 | Disposition: A | Discharge status: Home / Self Care | Payer: Commercial Managed Care - HMO | Source: Ambulatory Visit | Attending: Nurse Practitioner | Admitting: Nurse Practitioner

## 2015-08-24 ENCOUNTER — Ambulatory Visit (HOSPITAL_COMMUNITY): Admission: RE | Admit: 2015-08-24 | Payer: Medicare Other | Source: Ambulatory Visit

## 2015-08-24 DIAGNOSIS — Z1231 Encounter for screening mammogram for malignant neoplasm of breast: Secondary | ICD-10-CM | POA: Insufficient documentation

## 2015-08-29 ENCOUNTER — Telehealth: Payer: Self-pay | Admitting: Family Medicine

## 2015-08-29 NOTE — Telephone Encounter (Signed)
I certainly support the patient getting a gastric sleeve

## 2015-08-29 NOTE — Telephone Encounter (Signed)
Pt called stating that she has started the process of getting the gastric sleeve done. Pt wanted to let Lorin Picket and Eber Jones know.

## 2015-08-29 NOTE — Telephone Encounter (Signed)
Noted. Not sure is this was also sent to [facility]. Will forward.

## 2015-09-14 ENCOUNTER — Other Ambulatory Visit: Payer: Self-pay | Admitting: Family Medicine

## 2015-09-14 NOTE — Telephone Encounter (Signed)
May I refill Linzess.

## 2015-09-16 DIAGNOSIS — Z6841 Body Mass Index (BMI) 40.0 and over, adult: Secondary | ICD-10-CM | POA: Diagnosis not present

## 2015-10-05 ENCOUNTER — Inpatient Hospital Stay (HOSPITAL_COMMUNITY): Admission: RE | Admit: 2015-10-05 | Payer: Medicare Other | Source: Ambulatory Visit

## 2015-10-05 ENCOUNTER — Ambulatory Visit: Payer: Medicare Other | Admitting: Dietician

## 2015-10-06 ENCOUNTER — Encounter: Payer: Medicare Other | Attending: General Surgery | Admitting: Dietician

## 2015-10-06 ENCOUNTER — Encounter (HOSPITAL_COMMUNITY)
Admission: RE | Admit: 2015-10-06 | Discharge: 2015-10-06 | Disposition: A | Discharge status: Home / Self Care | Payer: Commercial Managed Care - HMO | Source: Ambulatory Visit | Attending: Family Medicine | Admitting: Family Medicine

## 2015-10-06 ENCOUNTER — Encounter: Payer: Self-pay | Admitting: Dietician

## 2015-10-06 DIAGNOSIS — Z6841 Body Mass Index (BMI) 40.0 and over, adult: Secondary | ICD-10-CM | POA: Insufficient documentation

## 2015-10-06 NOTE — Patient Instructions (Signed)
Follow Pre-Op Goals Try Protein Shakes Call [facility] at [phone] when surgery is scheduled to enroll in Pre-Op Class  Things to remember:  Please always be honest with us. We want to support you!  If you have any questions or concerns in between appointments, please call or email Liz, Matteson Blue, or Laurie.  The diet after surgery will be high protein and low in carbohydrate.  Vitamins and calcium need to be taken for the rest of your life.  Feel free to include support people in any classes or appointments.   Supplement recommendations:  "Complete" Multivitamin: Sleeve Gastrectomy and RYGB patients take a double dose of MVI. Vitamin must be liquid or chewable but not gummy. Examples of these include Flintstones Complete and Centrum Complete. If the vitamin is bariatric-specific, take 1 dose as it is already formulated for bariatric surgery patients. Examples of these are Bariatric Advantage, Celebrate, and Wellesse. These can be found at the [facility] and/or online.     Calcium citrate: 1500 mg/day of Calcium citrate (also chewable or liquid) is recommended for all procedures. The body is only able to absorb 500-600 mg of Calcium at one time so 3 daily doses of 500 mg are recommended. Calcium doses must be taken a minimum of 2 hours apart. Additionally, Calcium must be taken 2 hours apart from iron-containing MVI. Examples of brands include Celebrate, Bariatric Advantage, and Wellesse. These brands must be purchased online or at the [facility]. Citracal Petites is the only Calcium citrate supplement found in general grocery stores and pharmacies. This is in tablet form and may be recommended for patients who do not tolerate chewable Calcium.  Continued or added Vitamin D supplementation based on individual needs.    Vitamin B12: 300-500 mcg/day for Sleeve Gastrectomy and RYGB. Must be taken intramuscularly, sublingually, or inhaled nasally. Oral route  is not recommended.  

## 2015-10-06 NOTE — Progress Notes (Signed)
  Pre-Op Assessment Visit:  Pre-Operative sleeve gastrectomy Surgery  Medical Nutrition Therapy:  Appt start time: 1055   End time:  1155.  Patient was seen on 10/06/2015 for Pre-Operative Nutrition Assessment. Assessment and letter of approval faxed to [facility] Surgery Bariatric Surgery Program coordinator on 10/06/2015.   Preferred Learning Style:   No preference indicated   Learning Readiness:   Ready  Handouts given during visit include:  Pre-Op Goals Bariatric Surgery Protein Shakes Pre op diet   During the appointment today the following Pre-Op Goals were reviewed with the patient: Maintain or lose weight as instructed by your surgeon Make healthy food choices Begin to limit portion sizes Limited concentrated sugars and fried foods Keep fat/sugar in the single digits per serving on   food labels Practice CHEWING your food  (aim for 30 chews per bite or until applesauce consistency) Practice not drinking 15 minutes before, during, and 30 minutes after each meal/snack Avoid all carbonated beverages  Avoid/limit caffeinated beverages  Avoid all sugar-sweetened beverages Consume 3 meals per day; eat every 3-5 hours Make a list of non-food related activities Aim for 64-100 ounces of FLUID daily  Aim for at least 60-80 grams of PROTEIN daily Look for a liquid protein source that contain ?15 g protein and ?5 g carbohydrate  (ex: shakes, drinks, shots)  Demonstrated degree of understanding via:  Teach Back  Teaching Method Utilized:  Visual Auditory Hands on  Barriers to learning/adherence to lifestyle change: none  Patient to call the [facility] to enroll in Pre-Op and Post-Op Nutrition Education when surgery date is scheduled.

## 2015-10-10 ENCOUNTER — Other Ambulatory Visit: Payer: Self-pay | Admitting: Family Medicine

## 2015-10-10 ENCOUNTER — Telehealth: Payer: Self-pay | Admitting: *Deleted

## 2015-10-10 NOTE — Telephone Encounter (Signed)
rx request from pharm requesting refill for methylphenidate. Ok to fill one time per dr Lorin Picket. Keep follow up with carolyn. Called pt to let her know script ready for pickup. Pt states she did not request this med and does not need a script at this time. Script that was printed was shredded in office.

## 2015-10-10 NOTE — Telephone Encounter (Signed)
Ok times one- keep follow yp with carolyn

## 2015-10-11 ENCOUNTER — Other Ambulatory Visit (HOSPITAL_COMMUNITY): Payer: Self-pay | Admitting: General Surgery

## 2015-10-25 ENCOUNTER — Ambulatory Visit (HOSPITAL_COMMUNITY)
Admission: RE | Admit: 2015-10-25 | Discharge: 2015-10-25 | Disposition: A | Discharge status: Home / Self Care | Payer: Medicare Other | Source: Ambulatory Visit | Attending: General Surgery | Admitting: General Surgery

## 2015-10-25 DIAGNOSIS — K219 Gastro-esophageal reflux disease without esophagitis: Secondary | ICD-10-CM | POA: Insufficient documentation

## 2015-10-25 DIAGNOSIS — Z01818 Encounter for other preprocedural examination: Secondary | ICD-10-CM | POA: Diagnosis not present

## 2015-10-26 ENCOUNTER — Encounter (HOSPITAL_COMMUNITY)
Admission: RE | Admit: 2015-10-26 | Discharge: 2015-10-26 | Disposition: A | Discharge status: Home / Self Care | Payer: Medicare Other | Source: Ambulatory Visit | Attending: General Surgery | Admitting: General Surgery

## 2015-10-26 DIAGNOSIS — Z01818 Encounter for other preprocedural examination: Secondary | ICD-10-CM | POA: Insufficient documentation

## 2015-10-26 DIAGNOSIS — E669 Obesity, unspecified: Secondary | ICD-10-CM | POA: Insufficient documentation

## 2015-10-26 DIAGNOSIS — I4510 Unspecified right bundle-branch block: Secondary | ICD-10-CM | POA: Diagnosis not present

## 2015-11-02 ENCOUNTER — Encounter: Payer: Medicare Other | Attending: General Surgery | Admitting: Nutrition

## 2015-11-02 ENCOUNTER — Encounter: Payer: Self-pay | Admitting: Nutrition

## 2015-11-02 DIAGNOSIS — Z6841 Body Mass Index (BMI) 40.0 and over, adult: Secondary | ICD-10-CM | POA: Insufficient documentation

## 2015-11-02 DIAGNOSIS — R739 Hyperglycemia, unspecified: Secondary | ICD-10-CM

## 2015-11-02 NOTE — Patient Instructions (Signed)
Goals 1. Chew your food more: 30 times per bite. 2. Avoid carbonated and diet sodas, including green tea with caffeine 3. Drink only water 4. Increase low carb vegetables. 5. Cut out all juices and fruit. 6. Increase protein rich foods. 7. Exercise 30 minutes 3-4 times per week. 8. Lose 1-2 lbs per werek.

## 2015-11-02 NOTE — Progress Notes (Signed)
  Pre-Op Assessment Visit:  Pre-Operative sleeve gastrectomy Surgery  Medical Nutrition Therapy:  Appt start time: 1530   End time:  1600 Patient was seen on 10/06/2015 for Pre-Operative Nutrition Assessment. Assessment and letter of approval faxed to [facility] Surgery Bariatric Surgery Program coordinator on 10/06/2015.  She has MS. She reports eating better, watching portions and avoiding snacks. She is drinking juices, fruit and some carbonated beverages at times. Lost 3 lbs since last visit. A1C 6.2%. Diet recall: B) Breakfast Protein shake EAS L) Sandwich LS chicken on [employer] bread,  Ritz crackers. Water D) Ham sandwich LS on ww bread,  Protein bar. Changed since visit: Cut out sodas and red meat, drinking more water. Now reading food labels and watching for protein and salt or fat content. She is trying to chew her foods more often.  Preferred Learning Style:   No preference indicated   Learning Readiness:   Ready  Handouts given during visit include:  Pre-Op Goals Bariatric Surgery Protein Shakes Pre op diet  During the appointment today the following Pre-Op Goals were reviewed with the patient: Maintain or lose weight as instructed by your surgeon Make healthy food choices Begin to limit portion sizes Limited concentrated sugars and fried foods Keep fat/sugar in the single digits per serving on   food labels Practice CHEWING your food  (aim for 30 chews per bite or until applesauce consistency) Practice not drinking 15 minutes before, during, and 30 minutes after each meal/snack Avoid all carbonated beverages  Avoid/limit caffeinated beverages  Avoid all sugar-sweetened beverages CUT OUT ALL JUICES Consume 3 meals per day; eat every 3-5 hours Make a list of non-food related activities Aim for 64-100 ounces of FLUID daily  Aim for at least 60-80 grams of PROTEIN daily Look for a liquid protein source that contain ?15 g protein and ?5 g carbohydrate  (ex: shakes,  drinks, shots)  Demonstrated degree of understanding via:  Teach Back  Teaching Method Utilized:  Visual Auditory Hands on  Barriers to learning/adherence to lifestyle change: none  Patient to call the [facility] to enroll in Pre-Op and Post-Op Nutrition Education when surgery date is scheduled.

## 2015-11-10 ENCOUNTER — Ambulatory Visit: Payer: Medicare Other | Admitting: Nurse Practitioner

## 2015-11-17 ENCOUNTER — Other Ambulatory Visit: Payer: Self-pay | Admitting: Family Medicine

## 2015-11-17 DIAGNOSIS — M1711 Unilateral primary osteoarthritis, right knee: Secondary | ICD-10-CM | POA: Diagnosis not present

## 2015-11-17 DIAGNOSIS — G4709 Other insomnia: Secondary | ICD-10-CM | POA: Diagnosis not present

## 2015-11-17 DIAGNOSIS — E6609 Other obesity due to excess calories: Secondary | ICD-10-CM | POA: Diagnosis not present

## 2015-11-17 DIAGNOSIS — R5383 Other fatigue: Secondary | ICD-10-CM | POA: Diagnosis not present

## 2015-11-29 ENCOUNTER — Encounter: Payer: Self-pay | Admitting: Skilled Nursing Facility1

## 2015-11-29 ENCOUNTER — Encounter: Payer: Medicare Other | Attending: General Surgery | Admitting: Skilled Nursing Facility1

## 2015-11-29 VITALS — Ht 67.0 in | Wt 286.0 lb

## 2015-11-29 DIAGNOSIS — Z6841 Body Mass Index (BMI) 40.0 and over, adult: Secondary | ICD-10-CM | POA: Diagnosis not present

## 2015-11-29 DIAGNOSIS — E669 Obesity, unspecified: Secondary | ICD-10-CM

## 2015-11-29 NOTE — Progress Notes (Signed)
   Pre-Operative sleeve gastrectomy Surgery  Medical Nutrition Therapy:  Appt start time: 1500  End time:  1515 Pt returns having lost 4 pounds.Pt states She has MS. Pt states She is doing well. Pt states Soda and sweet drinks are her main issue and she really does not overeat. Pt states she is focusing on behavior change and is doing well with that implementation.  Diet recall: 3-4 EAS protein drinks, atkins, slim fast. Dietitian educated pt on protein communications and educating pt on protein consumption and fiber.   Exercise bike 10-15 minutes  Wt: 286 pounds BMI: 44.9 Preferred Learning Style:   No preference indicated   Learning Readiness:   Ready  Demonstrated degree of understanding via:  Teach Back  Teaching Method Utilized:  Visual Auditory Hands on  Barriers to learning/adherence to lifestyle change: none  [facility] to enroll in Pre-Op and Post-Op Nutrition Education when surgery date is scheduled.

## 2015-11-29 NOTE — Patient Instructions (Signed)
-  Eat protein with your fruit -Protein recommendations for shakes -

## 2015-11-30 ENCOUNTER — Ambulatory Visit: Payer: Medicare Other | Admitting: Nutrition

## 2015-12-12 ENCOUNTER — Ambulatory Visit (INDEPENDENT_AMBULATORY_CARE_PROVIDER_SITE_OTHER): Payer: Medicare Other | Admitting: Family Medicine

## 2015-12-12 ENCOUNTER — Encounter: Payer: Self-pay | Admitting: Family Medicine

## 2015-12-12 VITALS — BP 112/70 | Ht 69.0 in | Wt 289.5 lb

## 2015-12-12 DIAGNOSIS — M5431 Sciatica, right side: Secondary | ICD-10-CM | POA: Diagnosis not present

## 2015-12-12 DIAGNOSIS — G894 Chronic pain syndrome: Secondary | ICD-10-CM

## 2015-12-12 DIAGNOSIS — M25561 Pain in right knee: Secondary | ICD-10-CM

## 2015-12-12 DIAGNOSIS — Z79891 Long term (current) use of opiate analgesic: Secondary | ICD-10-CM

## 2015-12-12 MED ORDER — METHYLPHENIDATE HCL ER (LA) 30 MG PO CP24: ORAL_CAPSULE | ORAL | Status: DC

## 2015-12-12 MED ORDER — OXYCODONE-ACETAMINOPHEN 10-325 MG PO TABS: 1.0000 | ORAL_TABLET | Freq: Three times a day (TID) | ORAL | Status: DC | PRN

## 2015-12-12 NOTE — Progress Notes (Signed)
   Subjective:    Patient ID: Melinda Moore, female    DOB: 02-Jan-1971, 45 y.o.   MRN: [id number]  HPI This patient was seen today for chronic pain  The medication list was reviewed and updated.   -Compliance with medication: Takes daily  - Number patient states they take daily: Takes up to 2 daily.  -when was the last dose patient took? This morning at 1000   The patient was advised the importance of maintaining medication and not using illegal substances with these.  Refills needed: Yes  The patient was educated that we can provide 3 monthly scripts for their medication, it is their responsibility to follow the instructions.  Side effects or complications from medications: None  Patient is aware that pain medications are meant to minimize the severity of the pain to allow their pain levels to improve to allow for better function. They are aware of that pain medications cannot totally remove their pain.  Due for UDT ( at least once per year) : Due today.  Patient has concerns of pain to right leg.   Patient relates burning on the right side of her lower leg. She also has some sciatica symptoms down the leg plus also some knee pain. She is had an injection in the knee did not really help. Patient has MS the Ritalin helps her with energy and also helps her with focus he was originally started by her specialist but is prescribed by [place] because of difficulty getting back and forth to the specialists every 3 months. Review of Systems  Constitutional: Negative for activity change and appetite change.  Gastrointestinal: Negative for vomiting and abdominal pain.  Neurological: Negative for weakness.  Psychiatric/Behavioral: Negative for confusion.       Objective:   Physical Exam  Constitutional: She appears well-nourished. No distress.  HENT:  Head: Normocephalic.  Cardiovascular: Normal rate, regular rhythm and normal heart sounds.   No murmur heard. Pulmonary/Chest: Effort normal  and breath sounds normal.  Musculoskeletal: She exhibits no edema.  Lymphadenopathy:    She has no cervical adenopathy.  Neurological: She is alert.  Psychiatric: Her behavior is normal.  Vitals reviewed.  Mild crepitus in the right knee but no laxity. The knee does not lock. 25 minutes spent in patient greater than half in discussion of multiple issues      Assessment & Plan:  Sciatica down the right leg-I don't see that the patient needs a MRI of the back at this point. If worsening conditions may need to do so. Also consider injections in the back if worsening conditions continue gabapentin use pain medication but avoid overusage  Drug registry was checked. Pain prescription given. Urine drug screen collected today. Pain contract reviewed in detail. Patient was cautioned not to operate any equipment if feeling drowsy  Fibromyalgia diagnosed by rheumatologist patient already on Effexor and gabapentin and pain medicine recommend OTC anti-inflammatory when necessary plus gentle exercises  Right knee pain patient wonders if a brace will help prescription written We will also be getting lab results from her rheumatology doctor Patient is due cholesterol she was instructed to get this drawn. Patient encouraged healthy diet regular exercise she is getting a gastric sleeve hopefully in the fall time

## 2015-12-14 ENCOUNTER — Other Ambulatory Visit: Payer: Self-pay | Admitting: Family Medicine

## 2015-12-14 ENCOUNTER — Telehealth: Payer: Self-pay | Admitting: Family Medicine

## 2015-12-14 MED ORDER — DULOXETINE HCL 30 MG PO CPEP: 30.0000 mg | ORAL_CAPSULE | Freq: Every day | ORAL | Status: DC

## 2015-12-14 NOTE — Telephone Encounter (Signed)
Patient notified

## 2015-12-14 NOTE — Telephone Encounter (Signed)
Patient called [facility] and they do not have the Cymbalta Rx that she says was talked about Monday.  Please advise.

## 2015-12-14 NOTE — Telephone Encounter (Signed)
I sent the prescription and just now. If it is not covered  pharmacy is to notify us. Cymbalta 30 mg 1 daily

## 2015-12-20 LAB — TOXASSURE SELECT 13 (MW), URINE: PDF: 0

## 2015-12-22 ENCOUNTER — Telehealth: Payer: Self-pay | Admitting: *Deleted

## 2015-12-22 MED ORDER — OXYCODONE-ACETAMINOPHEN 10-325 MG PO TABS: 1.0000 | ORAL_TABLET | Freq: Three times a day (TID) | ORAL | Status: DC | PRN

## 2015-12-22 NOTE — Telephone Encounter (Signed)
Pt calling to pick up pain scripts. Called [facility]. Last filled 11/11/15. Pt had script printed for 7/3 to fill. Called pt to see if she had script. She does. She states she will fill that one tomorrow and she needs the one for august and September. Ok per dr Lorin Picket. Scripts ready. Pt notified.

## 2015-12-28 ENCOUNTER — Encounter: Payer: Medicare Other | Attending: General Surgery | Admitting: Skilled Nursing Facility1

## 2015-12-28 ENCOUNTER — Ambulatory Visit: Payer: Commercial Managed Care - HMO | Admitting: Nutrition

## 2015-12-28 ENCOUNTER — Encounter: Payer: Self-pay | Admitting: Skilled Nursing Facility1

## 2015-12-28 VITALS — Ht 67.0 in | Wt 284.0 lb

## 2015-12-28 DIAGNOSIS — E669 Obesity, unspecified: Secondary | ICD-10-CM

## 2015-12-28 DIAGNOSIS — Z713 Dietary counseling and surveillance: Secondary | ICD-10-CM | POA: Diagnosis not present

## 2015-12-28 DIAGNOSIS — Z6841 Body Mass Index (BMI) 40.0 and over, adult: Secondary | ICD-10-CM | POA: Diagnosis not present

## 2015-12-28 NOTE — Progress Notes (Signed)
   Pre-Operative sleeve gastrectomy Surgery  Medical Nutrition Therapy:  Appt start time: 1500  End time:  1515 Pt returns having lost 5 pounds. Pt is 67 inches her height was taken today, previous documentation of 69 inches was an error. Pt states she is doing great. Pt questioned some of her medications after surgery and was referred to her physician for the answer. Pt states she has been drinking carnation: she understands the sugar content and will not drink them after her surgery.      Exercise bike 10-15 minutes and up and down stairs  Wt: 284 pounds BMI: 44.9 Preferred Learning Style:   No preference indicated   Learning Readiness:   Ready  Demonstrated degree of understanding via:  Teach Back  Teaching Method Utilized:  Visual Auditory Hands on  Barriers to learning/adherence to lifestyle change: none  [facility] to [facility] the [facility] to enroll in Pre-Op and Post-Op Nutrition Education when surgery date is scheduled.

## 2016-01-03 ENCOUNTER — Telehealth: Payer: Self-pay | Admitting: Family Medicine

## 2016-01-03 NOTE — Telephone Encounter (Signed)
PT States that her port cath needs to be flushed and that [facility] needs a new order for this sent over   Fax [phone]

## 2016-01-03 NOTE — Telephone Encounter (Signed)
Please send a new order for Port-A-Cath flush please talk with the patient find out how often she gets this completed, send order as requested

## 2016-01-04 NOTE — Telephone Encounter (Signed)
[facility] 01/04/16 

## 2016-01-04 NOTE — Telephone Encounter (Signed)
Please write an order for her to get Port-A-Cath flushed every 6-8 weeks per nursing protocols I will sign it then please send it

## 2016-01-04 NOTE — Telephone Encounter (Signed)
Spoke with patient and patient stated that she gets port flushed every 6-8 weeks.

## 2016-01-05 NOTE — Telephone Encounter (Signed)
Order to be faxed to [facility].

## 2016-01-05 NOTE — Telephone Encounter (Signed)
Order written; awaiting signature.  

## 2016-01-11 ENCOUNTER — Encounter (HOSPITAL_COMMUNITY): Payer: Medicare Other

## 2016-01-12 ENCOUNTER — Encounter (HOSPITAL_COMMUNITY): Admission: RE | Admit: 2016-01-12 | Payer: Commercial Managed Care - HMO | Source: Ambulatory Visit

## 2016-01-16 ENCOUNTER — Encounter (HOSPITAL_COMMUNITY): Payer: Commercial Managed Care - HMO

## 2016-01-17 ENCOUNTER — Encounter (HOSPITAL_COMMUNITY)
Admission: RE | Admit: 2016-01-17 | Discharge: 2016-01-17 | Disposition: A | Discharge status: Home / Self Care | Payer: Medicare Other | Source: Ambulatory Visit | Attending: Family Medicine | Admitting: Family Medicine

## 2016-01-17 ENCOUNTER — Encounter (HOSPITAL_COMMUNITY): Payer: Self-pay

## 2016-01-17 DIAGNOSIS — Z452 Encounter for adjustment and management of vascular access device: Secondary | ICD-10-CM | POA: Insufficient documentation

## 2016-01-17 MED ORDER — HEPARIN SOD (PORK) LOCK FLUSH 100 UNIT/ML IV SOLN
INTRAVENOUS | Status: AC
  Filled 2016-01-17: qty 5

## 2016-01-17 MED ORDER — HEPARIN SOD (PORK) LOCK FLUSH 100 UNIT/ML IV SOLN
500.0000 [IU] | INTRAVENOUS | Status: AC | PRN
  Administered 2016-01-17: 500 [IU]

## 2016-01-17 MED ORDER — SODIUM CHLORIDE 0.9% FLUSH
10.0000 mL | INTRAVENOUS | Status: AC | PRN
  Administered 2016-01-17: 10 mL

## 2016-01-17 MED ORDER — SODIUM CHLORIDE 0.9% FLUSH
INTRAVENOUS | Status: AC
  Filled 2016-01-17: qty 10

## 2016-01-24 ENCOUNTER — Encounter: Payer: Medicare Other | Attending: General Surgery | Admitting: Skilled Nursing Facility1

## 2016-01-24 ENCOUNTER — Encounter: Payer: Self-pay | Admitting: Skilled Nursing Facility1

## 2016-01-24 DIAGNOSIS — Z6841 Body Mass Index (BMI) 40.0 and over, adult: Secondary | ICD-10-CM | POA: Diagnosis not present

## 2016-01-24 DIAGNOSIS — Z713 Dietary counseling and surveillance: Secondary | ICD-10-CM | POA: Insufficient documentation

## 2016-01-24 NOTE — Progress Notes (Signed)
   Pre-Operative sleeve gastrectomy Surgery  Medical Nutrition Therapy:  Appt start time: 1500  End time:  1515 Pt returns having lost 1 pound.  Pt states she has been Liking the healthy way of eating: protein shakes, baked chicken, baked fish, grilled shrimp, "better than I thought". "In my mind overeating is not an option" Pt states she drinks water, green tea, and apple juice. Dietitian educated the pt on her juice consumption. Pt states she has been putting Fruit in her protein shakes: Dietitian educated her on that habit. Pt states she cannot wait to go for walks after her surgery.   Exercise bike 20 minutes every day and up and down stairs  Wt: 283 pounds BMI: 44 Preferred Learning Style:   No preference indicated   Learning Readiness:   Ready  Demonstrated degree of understanding via:  Teach Back  Teaching Method Utilized:  Visual Auditory Hands on  Barriers to learning/adherence to lifestyle change: none  [facility] to [facility] the [facility] to enroll in Pre-Op and Post-Op Nutrition Education when surgery date is scheduled.

## 2016-01-25 ENCOUNTER — Ambulatory Visit: Payer: Commercial Managed Care - HMO | Admitting: Nutrition

## 2016-02-08 DIAGNOSIS — G35 Multiple sclerosis: Secondary | ICD-10-CM | POA: Diagnosis not present

## 2016-02-22 ENCOUNTER — Encounter: Payer: Medicare Other | Attending: General Surgery | Admitting: Skilled Nursing Facility1

## 2016-02-22 ENCOUNTER — Ambulatory Visit: Payer: Commercial Managed Care - HMO | Admitting: Nutrition

## 2016-02-22 DIAGNOSIS — Z713 Dietary counseling and surveillance: Secondary | ICD-10-CM | POA: Insufficient documentation

## 2016-02-22 DIAGNOSIS — Z6841 Body Mass Index (BMI) 40.0 and over, adult: Secondary | ICD-10-CM | POA: Insufficient documentation

## 2016-02-22 NOTE — Progress Notes (Signed)
   Pre-Operative sleeve gastrectomy Surgery  Medical Nutrition Therapy:  Appt start time: 1500  End time:  1515 Pt returns having lost 3 pounds.  Pt states this is her last SWL. Pt states she is reading food labels, drinking protein shakes, and exercising. Pt states she cannot have soda because it is so sweet it is not palatable. Pt states she still chews 30 times per bite.   Exercise bike 20 minutes every day and up and down stairs  Wt: 280 pounds BMI: 43.85 Preferred Learning Style:   No preference indicated   Learning Readiness:   Ready  Demonstrated degree of understanding via:  Teach Back  Teaching Method Utilized:  Visual Auditory Hands on  Barriers to learning/adherence to lifestyle change: none  Patient to call the [facility] to enroll in Pre-Op and Post-Op Nutrition Education when surgery date is scheduled.

## 2016-02-24 ENCOUNTER — Other Ambulatory Visit: Payer: Self-pay | Admitting: Family Medicine

## 2016-03-06 ENCOUNTER — Encounter (HOSPITAL_COMMUNITY)
Admission: RE | Admit: 2016-03-06 | Discharge: 2016-03-06 | Disposition: A | Discharge status: Home / Self Care | Payer: Medicare Other | Source: Ambulatory Visit | Attending: Family Medicine | Admitting: Family Medicine

## 2016-03-13 ENCOUNTER — Ambulatory Visit: Payer: Medicare Other | Admitting: Rheumatology

## 2016-03-16 ENCOUNTER — Encounter: Payer: Self-pay | Admitting: Family Medicine

## 2016-03-16 ENCOUNTER — Ambulatory Visit (INDEPENDENT_AMBULATORY_CARE_PROVIDER_SITE_OTHER): Payer: Medicare Other | Admitting: Family Medicine

## 2016-03-16 VITALS — BP 132/84 | Ht 69.0 in | Wt 289.0 lb

## 2016-03-16 DIAGNOSIS — G894 Chronic pain syndrome: Secondary | ICD-10-CM

## 2016-03-16 DIAGNOSIS — L910 Hypertrophic scar: Secondary | ICD-10-CM | POA: Diagnosis not present

## 2016-03-16 DIAGNOSIS — Z23 Encounter for immunization: Secondary | ICD-10-CM

## 2016-03-16 MED ORDER — OXYCODONE-ACETAMINOPHEN 10-325 MG PO TABS: 1.0000 | ORAL_TABLET | Freq: Three times a day (TID) | ORAL | 0 refills | Status: DC | PRN

## 2016-03-16 MED ORDER — METHYLPHENIDATE HCL 10 MG PO TABS: ORAL_TABLET | ORAL | 0 refills | Status: DC

## 2016-03-16 NOTE — Progress Notes (Signed)
   Subjective:    Patient ID: Melinda Moore, female    DOB: 01/31/1971, 45 y.o.   MRN: [id number]  HPI Patient arrives to discuss referral for keloid on left ear.She had previously had this cut off but then it returned. This is causing her pain and discomfort she would like to have looked at and possibly removed Review of Systems  Constitutional: Negative for activity change, appetite change and fatigue.  Gastrointestinal: Negative for abdominal pain.  Neurological: Negative for headaches.  Psychiatric/Behavioral: Negative for behavioral problems.  Patient also has chronic pain and discomfort. She takes her medicine on a regular basis denies abusing it. It makes her pain more tolerable. Patient also takes ADD medicine she states it wears off she is to take 10 mg Ritalin at 8 AM and another one at noon and this helped her energy better she was put on this by her MS specialist years ago. Tolerates it well.     Objective:   Physical Exam  Constitutional: She appears well-nourished. No distress.  Cardiovascular: Normal rate, regular rhythm and normal heart sounds.   No murmur heard. Pulmonary/Chest: Effort normal and breath sounds normal. No respiratory distress.  Musculoskeletal: She exhibits no edema.  Lymphadenopathy:    She has no cervical adenopathy.  Neurological: She is alert. She exhibits normal muscle tone.  Psychiatric: Her behavior is normal.  Vitals reviewed.  Keloid noted on the left ear.       Assessment & Plan:  The patient was seen today as part of a comprehensive visit regarding pain control. Patient's compliance with the medication as well as discussion regarding effectiveness was completed. Prescriptions were written. Patient was advised to follow-up in 3 months. The patient was assessed for any signs of severe side effects. The patient was advised to take the medicine as directed and to report to [place] if any side effect issues.  This patient has adult ADD. Takes  medication responsibly. Medication does help the patient focus in be more functional. Patient relates that they are not abusing the medication or misusing the medication. The patient understands that if they're having any negative side effects such as elevated high blood pressure severe headaches palpitations they would need stop the medication follow-up immediately. They also understand that the prescriptions are to last for 3 months then the patient will need to follow-up before having further prescriptions.   Keloid referral to ENT for further evaluation and removal

## 2016-03-19 NOTE — Progress Notes (Signed)
Referral ordered in EPIC. 

## 2016-03-19 NOTE — Addendum Note (Signed)
Addended by: Margaretha SheffieldBROWN, Redmond Whittley S on: 03/19/2016 08:51 AM   Modules accepted: Orders

## 2016-03-22 ENCOUNTER — Ambulatory Visit (INDEPENDENT_AMBULATORY_CARE_PROVIDER_SITE_OTHER): Payer: Medicare Other | Admitting: Psychiatry

## 2016-03-27 ENCOUNTER — Ambulatory Visit (INDEPENDENT_AMBULATORY_CARE_PROVIDER_SITE_OTHER): Payer: Commercial Managed Care - HMO | Admitting: Psychiatry

## 2016-03-27 ENCOUNTER — Ambulatory Visit: Payer: Medicare Other | Admitting: Family Medicine

## 2016-03-28 ENCOUNTER — Encounter: Payer: Medicare Other | Attending: General Surgery | Admitting: Skilled Nursing Facility1

## 2016-03-28 ENCOUNTER — Encounter: Payer: Self-pay | Admitting: Skilled Nursing Facility1

## 2016-03-28 DIAGNOSIS — Z6841 Body Mass Index (BMI) 40.0 and over, adult: Secondary | ICD-10-CM | POA: Insufficient documentation

## 2016-03-28 DIAGNOSIS — Z713 Dietary counseling and surveillance: Secondary | ICD-10-CM | POA: Diagnosis not present

## 2016-03-28 NOTE — Progress Notes (Signed)
   Pre-Operative sleeve gastrectomy Surgery  Medical Nutrition Therapy:  Appt start time: 1500  End time:  1515 Pt returns having gained 1 pound.  Pt states this is her last SWL. Pt states she is reading food labels, drinking protein shakes, and exercising. Pt states she cannot have soda because it is so sweet it is not palatable. Pt states she still chews 30 times per bite.   Exercise bike 20 minutes every day and up and down stairs  Much more energy, feel much better, outlook is better  Wt: 280 pounds BMI: 44.1 Preferred Learning Style:   No preference indicated   Learning Readiness:   Ready  Demonstrated degree of understanding via:  Teach Back  Teaching Method Utilized:  Visual Auditory Hands on  Barriers to learning/adherence to lifestyle change: none  [facility] to [facility] the [facility] to enroll in Pre-Op and Post-Op Nutrition Education when surgery date is scheduled.

## 2016-04-03 ENCOUNTER — Encounter: Payer: Self-pay | Admitting: Family Medicine

## 2016-04-03 ENCOUNTER — Ambulatory Visit: Payer: Medicare Other | Admitting: Psychiatry

## 2016-04-05 NOTE — Progress Notes (Signed)
Surgery on 04/17/16.  Preop on 04/12/16.  Need orders in EPIc.  Thank You

## 2016-04-10 ENCOUNTER — Ambulatory Visit: Payer: Self-pay | Admitting: General Surgery

## 2016-04-11 NOTE — Patient Instructions (Addendum)
Melinda Moore  04/11/2016   Your procedure is scheduled on: 04/17/16  Report to [facility]  Entrance take [place]  elevators to 3rd floor to  Short Stay Center at 0915 AM.  Call this number if you have problems the morning of surgery [phone]   Remember: ONLY 1 PERSON MAY GO WITH YOU TO SHORT STAY TO GET  READY MORNING OF YOUR SURGERY.  Do not eat food or drink liquids :After Midnight.     Take these medicines the morning of surgery with A SIP OF WATER: Tegretol, Gabapentin, Skelaxin, Effexor, Rantitidine, Gilenya May take Oxycodone if needed                                You may not have any metal on your body including hair pins and              piercings  Do not wear jewelry, make-up, lotions, powders or perfumes, deodorant             Do not wear nail polish.  Do not shave  48 hours prior to surgery.              Men may shave face and neck.   Do not bring valuables to the hospital. [facility] IS NOT             RESPONSIBLE   FOR VALUABLES.  Contacts, dentures or bridgework may not be worn into surgery.  Leave suitcase in the car. After surgery it may be brought to your room.                  Please read over the following fact sheets you were given: _____________________________________________________________________             [facility] - Preparing for Surgery Before surgery, you can play an important role.  Because skin is not sterile, your skin needs to be as free of germs as possible.  You can reduce the number of germs on your skin by washing with CHG (chlorahexidine gluconate) soap before surgery.  CHG is an antiseptic cleaner which kills germs and bonds with the skin to continue killing germs even after washing. Please DO NOT use if you have an allergy to CHG or antibacterial soaps.  If your skin becomes reddened/irritated stop using the CHG and inform your nurse when you arrive at Short Stay. Do not shave (including legs and  underarms) for at least 48 hours prior to the first CHG shower.  You may shave your face/neck. Please follow these instructions carefully:  1.  Shower with CHG Soap the night before surgery and the  morning of Surgery.  2.  If you choose to wash your hair, wash your hair first as usual with your  normal  shampoo.  3.  After you shampoo, rinse your hair and body thoroughly to remove the  shampoo.                           4.  Use CHG as you would any other liquid soap.  You can apply chg directly  to the skin and wash                       Gently with a scrungie  or clean washcloth.  5.  Apply the CHG Soap to your body ONLY FROM THE NECK DOWN.   Do not use on face/ open                           Wound or open sores. Avoid contact with eyes, ears mouth and genitals (private parts).                       Wash face,  Genitals (private parts) with your normal soap.             6.  Wash thoroughly, paying special attention to the area where your surgery  will be performed.  7.  Thoroughly rinse your body with warm water from the neck down.  8.  DO NOT shower/wash with your normal soap after using and rinsing off  the CHG Soap.                9.  Pat yourself dry with a clean towel.            10.  Wear clean pajamas.            11.  Place clean sheets on your bed the night of your first shower and do not  sleep with pets. Day of Surgery : Do not apply any lotions/deodorants the morning of surgery.  Please wear clean clothes to the hospital/surgery center.  FAILURE TO FOLLOW THESE INSTRUCTIONS MAY RESULT IN THE CANCELLATION OF YOUR SURGERY PATIENT SIGNATURE_________________________________  NURSE SIGNATURE__________________________________  ________________________________________________________________________

## 2016-04-12 ENCOUNTER — Encounter (HOSPITAL_COMMUNITY)
Admission: RE | Admit: 2016-04-12 | Discharge: 2016-04-12 | Disposition: A | Discharge status: Home / Self Care | Payer: Commercial Managed Care - HMO | Source: Ambulatory Visit | Attending: General Surgery | Admitting: General Surgery

## 2016-04-12 ENCOUNTER — Encounter (HOSPITAL_COMMUNITY): Payer: Self-pay

## 2016-04-12 DIAGNOSIS — Z01810 Encounter for preprocedural cardiovascular examination: Secondary | ICD-10-CM | POA: Diagnosis not present

## 2016-04-12 HISTORY — DX: Personal history of other medical treatment: Z92.89

## 2016-04-12 HISTORY — DX: Fibromyalgia: M79.7

## 2016-04-12 HISTORY — DX: Gastro-esophageal reflux disease without esophagitis: K21.9

## 2016-04-12 LAB — CBC WITH DIFFERENTIAL/PLATELET
Basophils Absolute: 0 10*3/uL (ref 0.0–0.1)
Basophils Relative: 1 %
EOS ABS: 0.1 10*3/uL (ref 0.0–0.7)
Eosinophils Relative: 2 %
HCT: 34.8 % — ABNORMAL LOW (ref 36.0–46.0)
HEMOGLOBIN: 11.2 g/dL — AB (ref 12.0–15.0)
LYMPHS ABS: 1.4 10*3/uL (ref 0.7–4.0)
Lymphocytes Relative: 23 %
MCH: 26.8 pg (ref 26.0–34.0)
MCHC: 32.2 g/dL (ref 30.0–36.0)
MCV: 83.3 fL (ref 78.0–100.0)
MONOS PCT: 7 %
Monocytes Absolute: 0.4 10*3/uL (ref 0.1–1.0)
NEUTROS PCT: 67 %
Neutro Abs: 4.1 10*3/uL (ref 1.7–7.7)
Platelets: 364 10*3/uL (ref 150–400)
RBC: 4.18 MIL/uL (ref 3.87–5.11)
RDW: 15.4 % (ref 11.5–15.5)
WBC: 6.2 10*3/uL (ref 4.0–10.5)

## 2016-04-12 LAB — COMPREHENSIVE METABOLIC PANEL
ALK PHOS: 146 U/L — AB (ref 38–126)
ALT: 27 U/L (ref 14–54)
ANION GAP: 5 (ref 5–15)
AST: 19 U/L (ref 15–41)
Albumin: 4 g/dL (ref 3.5–5.0)
BILIRUBIN TOTAL: 0.5 mg/dL (ref 0.3–1.2)
BUN: 14 mg/dL (ref 6–20)
CALCIUM: 8.9 mg/dL (ref 8.9–10.3)
CO2: 26 mmol/L (ref 22–32)
Chloride: 107 mmol/L (ref 101–111)
Creatinine, Ser: 0.83 mg/dL (ref 0.44–1.00)
Glucose, Bld: 104 mg/dL — ABNORMAL HIGH (ref 65–99)
Potassium: 4 mmol/L (ref 3.5–5.1)
SODIUM: 138 mmol/L (ref 135–145)
TOTAL PROTEIN: 7.6 g/dL (ref 6.5–8.1)

## 2016-04-12 MED FILL — oxyCODONE HCL 5 MG/5ML SOLN: 5 | 3 days supply | Qty: 200 | Fill #0

## 2016-04-13 ENCOUNTER — Encounter (HOSPITAL_COMMUNITY): Payer: Self-pay

## 2016-04-16 ENCOUNTER — Encounter: Payer: Medicare Other | Attending: General Surgery | Admitting: Dietician

## 2016-04-16 ENCOUNTER — Encounter: Payer: Self-pay | Admitting: Dietician

## 2016-04-16 DIAGNOSIS — Z713 Dietary counseling and surveillance: Secondary | ICD-10-CM | POA: Insufficient documentation

## 2016-04-16 DIAGNOSIS — Z6841 Body Mass Index (BMI) 40.0 and over, adult: Secondary | ICD-10-CM | POA: Diagnosis not present

## 2016-04-16 NOTE — Progress Notes (Signed)
  Pre-Operative Nutrition Class:  Appt start time: 830   End time:  930.  Patient was seen on 04/16/2016 for Pre-Operative Bariatric Surgery Education at [facility].   Surgery date: 04/17/2016 Surgery type: sleeve gastrectomy Start weight at [facility]: 290 lbs on 11/02/2015 Weight today: 279 lbs  TANITA  BODY COMP RESULTS  04/16/16   BMI (kg/m^2) 43.7   Fat Mass (lbs) 152.8   Fat Free Mass (lbs) 126.2   Total Body Water (lbs) 93.8   Samples given per MNT protocol. Patient educated on appropriate usage: Premier protein shake (chocolate - qty 1) Lot #: [id number] Exp: 02/2017  The following the learning objectives were met by the patient during this course:  Identify Pre-Op Dietary Goals and will begin 2 weeks pre-operatively  Identify appropriate sources of fluids and proteins   State protein recommendations and appropriate sources pre and post-operatively  Identify Post-Operative Dietary Goals and will follow for 2 weeks post-operatively  Identify appropriate multivitamin and calcium sources  Describe the need for physical activity post-operatively and will follow MD recommendations  State when to call healthcare provider regarding medication questions or post-operative complications  Handouts given during class include:  Pre-Op Bariatric Surgery Diet Handout  Protein Shake Handout  Post-Op Bariatric Surgery Nutrition Handout  [facility]EL[facility] Program Information Flyer  Support Group Information Flyer  [facility] Bariatric Supplements Price List  Follow-Up Plan: Patient will follow-up at [facility] 2 weeks post operatively for diet advancement per MD.

## 2016-04-17 ENCOUNTER — Inpatient Hospital Stay (HOSPITAL_COMMUNITY)
Admission: RE | Admit: 2016-04-17 | Discharge: 2016-04-19 | DRG: 621 | Disposition: A | Discharge status: Home / Self Care | Payer: Commercial Managed Care - HMO | Source: Ambulatory Visit | Attending: General Surgery | Admitting: General Surgery

## 2016-04-17 ENCOUNTER — Inpatient Hospital Stay (HOSPITAL_COMMUNITY): Payer: Commercial Managed Care - HMO | Admitting: Anesthesiology

## 2016-04-17 ENCOUNTER — Encounter (HOSPITAL_COMMUNITY): Payer: Self-pay | Admitting: Anesthesiology

## 2016-04-17 ENCOUNTER — Encounter (HOSPITAL_COMMUNITY)
Admission: RE | Disposition: A | Discharge status: Home / Self Care | Payer: Self-pay | Source: Ambulatory Visit | Attending: General Surgery

## 2016-04-17 DIAGNOSIS — Z79899 Other long term (current) drug therapy: Secondary | ICD-10-CM | POA: Diagnosis not present

## 2016-04-17 DIAGNOSIS — Z86711 Personal history of pulmonary embolism: Secondary | ICD-10-CM | POA: Diagnosis not present

## 2016-04-17 DIAGNOSIS — K219 Gastro-esophageal reflux disease without esophagitis: Secondary | ICD-10-CM | POA: Diagnosis present

## 2016-04-17 DIAGNOSIS — G35 Multiple sclerosis: Secondary | ICD-10-CM | POA: Diagnosis present

## 2016-04-17 DIAGNOSIS — R066 Hiccough: Secondary | ICD-10-CM | POA: Diagnosis not present

## 2016-04-17 DIAGNOSIS — Z86718 Personal history of other venous thrombosis and embolism: Secondary | ICD-10-CM | POA: Diagnosis not present

## 2016-04-17 DIAGNOSIS — Z6841 Body Mass Index (BMI) 40.0 and over, adult: Secondary | ICD-10-CM

## 2016-04-17 HISTORY — PX: LAPAROSCOPIC GASTRIC SLEEVE RESECTION: SHX5895

## 2016-04-17 LAB — HEMOGLOBIN AND HEMATOCRIT, BLOOD
HEMATOCRIT: 35.1 % — AB (ref 36.0–46.0)
HEMOGLOBIN: 11.3 g/dL — AB (ref 12.0–15.0)

## 2016-04-17 SURGERY — GASTRECTOMY, SLEEVE, LAPAROSCOPIC
Anesthesia: General | Site: Abdomen

## 2016-04-17 MED ORDER — MORPHINE SULFATE (PF) 4 MG/ML IV SOLN
2.0000 mg | INTRAVENOUS | Status: DC | PRN
Start: 2016-04-17 — End: 2016-04-19
  Administered 2016-04-17 – 2016-04-18 (×3): 4 mg via INTRAVENOUS
  Filled 2016-04-17 (×3): qty 1

## 2016-04-17 MED ORDER — OXYCODONE HCL 5 MG/5ML PO SOLN
5.0000 mg | ORAL | Status: DC | PRN
  Administered 2016-04-18: 5 mg via ORAL
  Filled 2016-04-17: qty 5

## 2016-04-17 MED ORDER — MORPHINE SULFATE (PF) 2 MG/ML IV SOLN: 2.0000 mg | INTRAVENOUS | Status: DC | PRN

## 2016-04-17 MED ORDER — CEFOTETAN DISODIUM-DEXTROSE 2-2.08 GM-% IV SOLR
INTRAVENOUS | Status: AC
  Filled 2016-04-17: qty 50

## 2016-04-17 MED ORDER — HYDROMORPHONE HCL 1 MG/ML IJ SOLN
0.2500 mg | INTRAMUSCULAR | Status: DC | PRN
  Administered 2016-04-17: 0.5 mg via INTRAVENOUS
  Administered 2016-04-17 (×2): 0.25 mg via INTRAVENOUS

## 2016-04-17 MED ORDER — HYDROMORPHONE HCL 1 MG/ML IJ SOLN
INTRAMUSCULAR | Status: AC
  Administered 2016-04-17: 0.25 mg via INTRAVENOUS
  Filled 2016-04-17: qty 1

## 2016-04-17 MED ORDER — ACETAMINOPHEN 160 MG/5ML PO SOLN
650.0000 mg | ORAL | Status: DC | PRN
  Administered 2016-04-19: 650 mg via ORAL
  Filled 2016-04-17: qty 20.3

## 2016-04-17 MED ORDER — SUCCINYLCHOLINE CHLORIDE 200 MG/10ML IV SOSY
PREFILLED_SYRINGE | INTRAVENOUS | Status: DC | PRN
  Administered 2016-04-17: 140 mg via INTRAVENOUS

## 2016-04-17 MED ORDER — BUPIVACAINE HCL (PF) 0.5 % IJ SOLN
INTRAMUSCULAR | Status: AC
  Filled 2016-04-17: qty 30

## 2016-04-17 MED ORDER — SUGAMMADEX SODIUM 500 MG/5ML IV SOLN
INTRAVENOUS | Status: AC
  Filled 2016-04-17: qty 5

## 2016-04-17 MED ORDER — ONDANSETRON HCL 4 MG/2ML IJ SOLN
INTRAMUSCULAR | Status: DC | PRN
  Administered 2016-04-17: 4 mg via INTRAVENOUS

## 2016-04-17 MED ORDER — ONDANSETRON HCL 4 MG/2ML IJ SOLN
INTRAMUSCULAR | Status: AC
  Filled 2016-04-17: qty 2

## 2016-04-17 MED ORDER — MORPHINE SULFATE (PF) 2 MG/ML IV SOLN
2.0000 mg | INTRAVENOUS | Status: DC | PRN
  Filled 2016-04-17: qty 1

## 2016-04-17 MED ORDER — DEXAMETHASONE SODIUM PHOSPHATE 10 MG/ML IJ SOLN
INTRAMUSCULAR | Status: AC
  Filled 2016-04-17: qty 1

## 2016-04-17 MED ORDER — PROMETHAZINE HCL 25 MG/ML IJ SOLN
6.2500 mg | INTRAMUSCULAR | Status: DC | PRN
  Administered 2016-04-17: 12.5 mg via INTRAVENOUS

## 2016-04-17 MED ORDER — EVICEL 5 ML EX KIT
PACK | Freq: Once | CUTANEOUS | Status: AC
  Administered 2016-04-17: 5 mL
  Filled 2016-04-17: qty 1

## 2016-04-17 MED ORDER — PROPOFOL 10 MG/ML IV BOLUS
INTRAVENOUS | Status: AC
  Filled 2016-04-17: qty 20

## 2016-04-17 MED ORDER — MIDAZOLAM HCL 5 MG/5ML IJ SOLN
INTRAMUSCULAR | Status: DC | PRN
  Administered 2016-04-17: 2 mg via INTRAVENOUS

## 2016-04-17 MED ORDER — LACTATED RINGERS IR SOLN
Status: DC | PRN
  Administered 2016-04-17: 3000 mL

## 2016-04-17 MED ORDER — 0.9 % SODIUM CHLORIDE (POUR BTL) OPTIME
TOPICAL | Status: DC | PRN
  Administered 2016-04-17: 1000 mL

## 2016-04-17 MED ORDER — KETAMINE HCL 10 MG/ML IJ SOLN
INTRAMUSCULAR | Status: DC | PRN
  Administered 2016-04-17 (×5): 10 mg via INTRAVENOUS

## 2016-04-17 MED ORDER — ACETAMINOPHEN 160 MG/5ML PO SOLN: 325.0000 mg | ORAL | Status: DC | PRN

## 2016-04-17 MED ORDER — KETOROLAC TROMETHAMINE 30 MG/ML IJ SOLN: 30.0000 mg | Freq: Once | INTRAMUSCULAR | Status: DC | PRN

## 2016-04-17 MED ORDER — HEPARIN SODIUM (PORCINE) 5000 UNIT/ML IJ SOLN
5000.0000 [IU] | INTRAMUSCULAR | Status: AC
  Administered 2016-04-17: 5000 [IU] via SUBCUTANEOUS
  Filled 2016-04-17: qty 1

## 2016-04-17 MED ORDER — LIDOCAINE HCL (CARDIAC) 20 MG/ML IV SOLN
INTRAVENOUS | Status: DC | PRN
  Administered 2016-04-17: 50 mg via INTRAVENOUS

## 2016-04-17 MED ORDER — ENOXAPARIN SODIUM 30 MG/0.3ML ~~LOC~~ SOLN
30.0000 mg | Freq: Two times a day (BID) | SUBCUTANEOUS | Status: DC
  Administered 2016-04-18 – 2016-04-19 (×3): 30 mg via SUBCUTANEOUS
  Filled 2016-04-17 (×3): qty 0.3

## 2016-04-17 MED ORDER — ROCURONIUM BROMIDE 50 MG/5ML IV SOSY
PREFILLED_SYRINGE | INTRAVENOUS | Status: AC
  Filled 2016-04-17: qty 5

## 2016-04-17 MED ORDER — CEFOTETAN DISODIUM-DEXTROSE 2-2.08 GM-% IV SOLR
2.0000 g | INTRAVENOUS | Status: AC
  Administered 2016-04-17: 2 g via INTRAVENOUS

## 2016-04-17 MED ORDER — APREPITANT 40 MG PO CAPS
40.0000 mg | ORAL_CAPSULE | ORAL | Status: AC
  Administered 2016-04-17: 40 mg via ORAL
  Filled 2016-04-17: qty 1

## 2016-04-17 MED ORDER — SODIUM CHLORIDE 0.9 % IJ SOLN
INTRAMUSCULAR | Status: AC
  Filled 2016-04-17: qty 50

## 2016-04-17 MED ORDER — CHLORHEXIDINE GLUCONATE CLOTH 2 % EX PADS: 6.0000 | MEDICATED_PAD | Freq: Once | CUTANEOUS | Status: DC

## 2016-04-17 MED ORDER — SUGAMMADEX SODIUM 500 MG/5ML IV SOLN
INTRAVENOUS | Status: DC | PRN
  Administered 2016-04-17: 500 mg via INTRAVENOUS

## 2016-04-17 MED ORDER — SODIUM CHLORIDE 0.9 % IJ SOLN
INTRAMUSCULAR | Status: DC | PRN
  Administered 2016-04-17: 40 mL

## 2016-04-17 MED ORDER — BUPIVACAINE HCL (PF) 0.5 % IJ SOLN
INTRAMUSCULAR | Status: DC | PRN
  Administered 2016-04-17: 25 mL

## 2016-04-17 MED ORDER — PREMIER PROTEIN SHAKE
2.0000 [oz_av] | ORAL | Status: DC
  Administered 2016-04-19 (×2): 2 [oz_av] via ORAL

## 2016-04-17 MED ORDER — PROPOFOL 10 MG/ML IV BOLUS
INTRAVENOUS | Status: DC | PRN
  Administered 2016-04-17: 200 mg via INTRAVENOUS

## 2016-04-17 MED ORDER — ORAL CARE MOUTH RINSE
15.0000 mL | Freq: Two times a day (BID) | OROMUCOSAL | Status: DC
  Administered 2016-04-17 – 2016-04-18 (×3): 15 mL via OROMUCOSAL

## 2016-04-17 MED ORDER — FENTANYL CITRATE (PF) 100 MCG/2ML IJ SOLN
INTRAMUSCULAR | Status: DC | PRN
  Administered 2016-04-17: 100 ug via INTRAVENOUS
  Administered 2016-04-17 (×3): 50 ug via INTRAVENOUS

## 2016-04-17 MED ORDER — CHLORHEXIDINE GLUCONATE 0.12 % MT SOLN
15.0000 mL | Freq: Two times a day (BID) | OROMUCOSAL | Status: DC
  Administered 2016-04-17 – 2016-04-18 (×3): 15 mL via OROMUCOSAL
  Filled 2016-04-17 (×2): qty 15

## 2016-04-17 MED ORDER — ONDANSETRON HCL 4 MG/2ML IJ SOLN
4.0000 mg | INTRAMUSCULAR | Status: DC | PRN
  Administered 2016-04-17 – 2016-04-18 (×5): 4 mg via INTRAVENOUS
  Filled 2016-04-17 (×5): qty 2

## 2016-04-17 MED ORDER — ROCURONIUM BROMIDE 100 MG/10ML IV SOLN
INTRAVENOUS | Status: DC | PRN
  Administered 2016-04-17: 20 mg via INTRAVENOUS
  Administered 2016-04-17: 50 mg via INTRAVENOUS
  Administered 2016-04-17: 15 mg via INTRAVENOUS
  Administered 2016-04-17: 10 mg via INTRAVENOUS
  Administered 2016-04-17: 15 mg via INTRAVENOUS

## 2016-04-17 MED ORDER — BUPIVACAINE LIPOSOME 1.3 % IJ SUSP
20.0000 mL | Freq: Once | INTRAMUSCULAR | Status: AC
  Administered 2016-04-17: 20 mL
  Filled 2016-04-17: qty 20

## 2016-04-17 MED ORDER — DEXAMETHASONE SODIUM PHOSPHATE 10 MG/ML IJ SOLN
INTRAMUSCULAR | Status: DC | PRN
  Administered 2016-04-17: 10 mg via INTRAVENOUS

## 2016-04-17 MED ORDER — MIDAZOLAM HCL 2 MG/2ML IJ SOLN
INTRAMUSCULAR | Status: AC
  Filled 2016-04-17: qty 2

## 2016-04-17 MED ORDER — LACTATED RINGERS IV SOLN
INTRAVENOUS | Status: DC
  Administered 2016-04-17 (×3): via INTRAVENOUS

## 2016-04-17 MED ORDER — FAMOTIDINE IN NACL 20-0.9 MG/50ML-% IV SOLN
20.0000 mg | Freq: Two times a day (BID) | INTRAVENOUS | Status: DC
  Administered 2016-04-17 – 2016-04-19 (×4): 20 mg via INTRAVENOUS
  Filled 2016-04-17 (×7): qty 50

## 2016-04-17 MED ORDER — FENTANYL CITRATE (PF) 250 MCG/5ML IJ SOLN
INTRAMUSCULAR | Status: AC
  Filled 2016-04-17: qty 5

## 2016-04-17 MED ORDER — PROMETHAZINE HCL 25 MG/ML IJ SOLN
INTRAMUSCULAR | Status: AC
  Filled 2016-04-17: qty 1

## 2016-04-17 MED ORDER — POTASSIUM CHLORIDE IN NACL 20-0.9 MEQ/L-% IV SOLN
INTRAVENOUS | Status: DC
  Administered 2016-04-17 – 2016-04-19 (×4): via INTRAVENOUS
  Filled 2016-04-17 (×6): qty 1000

## 2016-04-17 MED ORDER — PROPOFOL 500 MG/50ML IV EMUL
INTRAVENOUS | Status: DC | PRN
  Administered 2016-04-17: 25 ug/kg/min via INTRAVENOUS

## 2016-04-17 SURGICAL SUPPLY — 68 items
ADH SKN CLS APL DERMABOND .7 (GAUZE/BANDAGES/DRESSINGS) ×1
APPLICATOR COTTON TIP 6IN STRL (MISCELLANEOUS) ×4 IMPLANT
APPLIER CLIP ROT 10 11.4 M/L (STAPLE)
APPLIER CLIP ROT 13.4 12 LRG (CLIP)
APR CLP LRG 13.4X12 ROT 20 MLT (CLIP)
APR CLP MED LRG 11.4X10 (STAPLE)
BLADE SURG SZ11 CARB STEEL (BLADE) ×3 IMPLANT
CABLE HIGH FREQUENCY MONO STRZ (ELECTRODE) ×3 IMPLANT
CHLORAPREP W/TINT 26ML (MISCELLANEOUS) ×5 IMPLANT
CLIP APPLIE ROT 10 11.4 M/L (STAPLE) IMPLANT
CLIP APPLIE ROT 13.4 12 LRG (CLIP) IMPLANT
COVER SURGICAL LIGHT HANDLE (MISCELLANEOUS) ×2 IMPLANT
DERMABOND ADVANCED (GAUZE/BANDAGES/DRESSINGS) ×2
DERMABOND ADVANCED .7 DNX12 (GAUZE/BANDAGES/DRESSINGS) ×1 IMPLANT
DEVICE SUT QUICK LOAD TK 5 (STAPLE) IMPLANT
DEVICE SUT TI-KNOT TK 5X26 (MISCELLANEOUS) IMPLANT
DEVICE SUTURE ENDOST 10MM (ENDOMECHANICALS) IMPLANT
DEVICE TI KNOT TK5 (MISCELLANEOUS)
DRAPE UTILITY XL STRL (DRAPES) ×2 IMPLANT
ELECT REM PT RETURN 9FT ADLT (ELECTROSURGICAL) ×3
ELECTRODE REM PT RTRN 9FT ADLT (ELECTROSURGICAL) ×1 IMPLANT
GAUZE SPONGE 4X4 12PLY STRL (GAUZE/BANDAGES/DRESSINGS) IMPLANT
GLOVE BIOGEL PI IND STRL 7.5 (GLOVE) ×1 IMPLANT
GLOVE BIOGEL PI INDICATOR 7.5 (GLOVE) ×8
GLOVE ECLIPSE 7.5 STRL STRAW (GLOVE) ×3 IMPLANT
GOWN STRL REUS W/TWL XL LVL3 (GOWN DISPOSABLE) ×14 IMPLANT
GRASPER SUT TROCAR 14GX15 (MISCELLANEOUS) ×2 IMPLANT
HOVERMATT SINGLE USE (MISCELLANEOUS) ×3 IMPLANT
IRRIG SUCT STRYKERFLOW 2 WTIP (MISCELLANEOUS) ×3
IRRIGATION SUCT STRKRFLW 2 WTP (MISCELLANEOUS) ×1 IMPLANT
KIT BASIN OR (CUSTOM PROCEDURE TRAY) ×3 IMPLANT
MARKER SKIN DUAL TIP RULER LAB (MISCELLANEOUS) ×3 IMPLANT
NDL SPNL 22GX3.5 QUINCKE BK (NEEDLE) ×1 IMPLANT
NEEDLE SPNL 22GX3.5 QUINCKE BK (NEEDLE) ×3 IMPLANT
PACK UNIVERSAL I (CUSTOM PROCEDURE TRAY) ×3 IMPLANT
QUICK LOAD TK 5 (STAPLE)
RELOAD STAPLE 60 3.6 BLU REG (STAPLE) ×1 IMPLANT
RELOAD STAPLE 60 3.8 GOLD REG (STAPLE) ×1 IMPLANT
RELOAD STAPLE 60 4.1 GRN THCK (STAPLE) ×1 IMPLANT
RELOAD STAPLER BLUE 60MM (STAPLE) ×3 IMPLANT
RELOAD STAPLER GOLD 60MM (STAPLE) ×1 IMPLANT
RELOAD STAPLER GREEN 60MM (STAPLE) ×2 IMPLANT
SCISSORS LAP 5X45 EPIX DISP (ENDOMECHANICALS) ×3 IMPLANT
SHEARS HARMONIC ACE PLUS 45CM (MISCELLANEOUS) ×3 IMPLANT
SLEEVE ADV FIXATION 5X100MM (TROCAR) ×3 IMPLANT
SLEEVE GASTRECTOMY 36FR VISIGI (MISCELLANEOUS) ×3 IMPLANT
SOLUTION ANTI FOG 6CC (MISCELLANEOUS) ×3 IMPLANT
SPONGE LAP 18X18 X RAY DECT (DISPOSABLE) ×3 IMPLANT
STAPLER ECHELON LONG 60 440 (INSTRUMENTS) ×3 IMPLANT
STAPLER RELOAD BLUE 60MM (STAPLE) ×9
STAPLER RELOAD GOLD 60MM (STAPLE) ×3
STAPLER RELOAD GREEN 60MM (STAPLE) ×6
SUT DEVICE BRAIDED 0X39 (SUTURE) IMPLANT
SUT MNCRL AB 4-0 PS2 18 (SUTURE) ×3 IMPLANT
SUT VICRYL 0 TIES 12 18 (SUTURE) IMPLANT
SYR 10ML ECCENTRIC (SYRINGE) ×3 IMPLANT
SYR 20CC LL (SYRINGE) ×6 IMPLANT
SYSTEM WECK SHIELD CLOSURE (TROCAR) ×2 IMPLANT
TIP RIGID 35CM EVICEL (HEMOSTASIS) ×3 IMPLANT
TOWEL OR 17X26 10 PK STRL BLUE (TOWEL DISPOSABLE) ×3 IMPLANT
TOWEL OR NON WOVEN STRL DISP B (DISPOSABLE) ×3 IMPLANT
TROCAR ADV FIXATION 5X100MM (TROCAR) ×3 IMPLANT
TROCAR BLADELESS 15MM (ENDOMECHANICALS) ×3 IMPLANT
TROCAR BLADELESS OPT 5 100 (ENDOMECHANICALS) ×3 IMPLANT
TUBING CONNECTING 10 (TUBING) ×2 IMPLANT
TUBING CONNECTING 10' (TUBING) ×1
TUBING ENDO SMARTCAP PENTAX (MISCELLANEOUS) ×3 IMPLANT
TUBING INSUF HEATED (TUBING) ×3 IMPLANT

## 2016-04-17 NOTE — Anesthesia Preprocedure Evaluation (Signed)
Anesthesia Evaluation  Patient identified by MRN, date of birth, ID band Patient awake    Reviewed: Allergy & Precautions, NPO status , Patient's Chart, lab work & pertinent test results  Airway Mallampati: II  TM Distance: >3 FB Neck ROM: Full    Dental no notable dental hx.    Pulmonary neg pulmonary ROS,    Pulmonary exam normal breath sounds clear to auscultation       Cardiovascular hypertension, Normal cardiovascular exam Rhythm:Regular Rate:Normal     Neuro/Psych MS  Neuromuscular disease negative psych ROS   GI/Hepatic negative GI ROS, Neg liver ROS,   Endo/Other  Morbid obesity  Renal/GU negative Renal ROS  negative genitourinary   Musculoskeletal negative musculoskeletal ROS (+)   Abdominal   Peds negative pediatric ROS (+)  Hematology negative hematology ROS (+)   Anesthesia Other Findings   Reproductive/Obstetrics negative OB ROS                             Anesthesia Physical Anesthesia Plan  ASA: III  Anesthesia Plan: General   Post-op Pain Management:    Induction: Intravenous  Airway Management Planned: Oral ETT  Additional Equipment:   Intra-op Plan:   Post-operative Plan: Extubation in OR  Informed Consent: I have reviewed the patients History and Physical, chart, labs and discussed the procedure including the risks, benefits and alternatives for the proposed anesthesia with the patient or authorized representative who has indicated his/her understanding and acceptance.   Dental advisory given  Plan Discussed with: CRNA and Surgeon  Anesthesia Plan Comments:         Anesthesia Quick Evaluation

## 2016-04-17 NOTE — Anesthesia Postprocedure Evaluation (Signed)
Anesthesia Post Note  Patient: Melinda Moore  Procedure(s) Performed: Procedure(s) (LRB): LAPAROSCOPIC GASTRIC SLEEVE RESECTION WITH UPPER ENDOSCOPY (N/A)  Patient location during evaluation: PACU Anesthesia Type: General Level of consciousness: awake and alert Pain management: pain level controlled Vital Signs Assessment: post-procedure vital signs reviewed and stable Respiratory status: spontaneous breathing, nonlabored ventilation, respiratory function stable and patient connected to nasal cannula oxygen Cardiovascular status: blood pressure returned to baseline and stable Postop Assessment: no signs of nausea or vomiting Anesthetic complications: no    Last Vitals:  Vitals:   04/17/16 1500 04/17/16 1515  BP: 124/73 119/70  Pulse: 98 95  Resp: 10 15  Temp:      Last Pain:  Vitals:   04/17/16 1515  TempSrc:   PainSc: Asleep                 Lagretta Loseke S

## 2016-04-17 NOTE — H&P (Signed)
History of Present Illness Melinda Moore(Melinda Goodgame T. Marisa Hufstetler MD; 04/12/2016 12:30 PM) The patient is a 45 year old female who presents with obesity. She returns for preoperative visit prior to planned laparoscopic sleeve gastrectomy. Her original presentation was as below: She is referred by Dr Gerda DissLuking for consideration for surgical treatment for morbid obesity. The patient gives a history of progressive obesity since early adulthood despite multiple attempts at medical management. She has been able to lose a moderate amount of weight at times but then experiences progressive weight regain. She was diagnosed with multiple sclerosis in 1997. 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 She is concerned about maintaining her mobility and Independence at her current weight if her disease progresses. Also concerned about other comorbid illnesses developing as she gets older. She does have some GERD that is well controlled with over-the-counter medications and does not seem particularly severe. The patient has been to our initial information seminar, researched surgical options thoroughly, and is interested in sleeve gastrectomy due to the less extensive nature of the surgery and possibly less risk. She has successfully completed her preoperative workup. Upper GI series showed some reflux but no other abnormalities. Lab work was unremarkable. No concerns on cycle nutrition evaluation. She has started her preoperative diet. No intercurrent illness.    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 Gastroesophageal Reflux Disease Hemorrhoids Migraine Headache DVT with lower extremity Anxiety Disorder Back Pain  Past Surgical History Melinda Moore(Melinda Seese T Yadira Hada,  MD; 04/12/2016 12:30 PM) Hysterectomy (not due to cancer) - Partial Oral Surgery Gallbladder Surgery - Laparoscopic  Diagnostic Studies History Melinda Moore(Melinda Bilek T Lakyra Tippins, MD; 04/12/2016 12:30 PM) Mammogram within last year Pap Smear 1-5 years ago  Allergies Timmothy Euler(Sade Bradford, CMA; 04/12/2016 11:52 AM) No Known Drug Allergies04/12/2015  Medication History Melinda Moore(Silvano Garofano T Shenicka Sunderlin, MD; 04/12/2016 12:30 PM) Gallium Nitrate Active. DiazePAM (10MG  Tablet, Oral) Active. Oxycodone-Acetaminophen (5-325MG  Tablet, Oral) Active. Gabapentin (300MG  Capsule, Oral) Active. TraZODone HCl (150MG  Tablet, Oral) Active. Medications Reconciled OxyCODONE HCl (5MG /5ML Solution, 5-10 Milliliter Oral every four hours, as needed, Taken starting 04/12/2016) Active. Protonix (40MG  Tablet DR, 1 (one) Tablet Oral daily, Taken starting 04/12/2016) Active. Zofran (4MG  Tablet, 1 (one) Tablet Oral every six hours, as needed, Taken starting 04/12/2016) Active.  Social History Melinda Moore(Melinda Steinhauser T Arbadella Kimbler, MD; 04/12/2016 12:30 PM) Caffeine use Carbonated beverages, Coffee, Tea.  Family History Melinda Moore(Melinda Brathwaite T Dominico Rod, MD; 04/12/2016 12:30 PM) Alcohol Abuse Brother. Hypertension Father, Sister. Arthritis Mother. Heart Disease Father. Heart disease in female family member before age 855  Pregnancy / Birth History Melinda Moore(Jeyla Bulger T Kavian Peters, MD; 04/12/2016 12:30 PM) Age at menarche 12 years. Gravida 2 Maternal age 45-25 Para 2  Vitals Timmothy Euler(Sade Bradford CMA; 04/12/2016 11:53 AM) 04/12/2016 11:52 AM Weight: 282 lb Height: 67in Body Surface Area: 2.34 m Body Mass Index: 44.17 kg/m  Temp.: 98.12F  Pulse: 62 (Regular)  BP: 126/84 (Sitting, Left Arm, Standard)       Physical Exam Melinda Moore(Shia Eber T. Lavender Stanke MD; 04/12/2016 12:31 PM) The physical exam findings are as follows: Note:General: Alert, obese African-American female, in no distress Skin: Warm and dry without rash or infection. HEENT: No palpable masses or  thyromegaly. Sclera nonicteric. Pupils equal round and reactive. Oropharynx clear. Lymph nodes: No cervical, supraclavicular, nodes palpable. Lungs: Breath sounds clear and equal. No wheezing or  increased work of breathing. Cardiovascular: Regular rate and rhythm without murmer. No JVD or edema. Peripheral pulses intact. No carotid bruits. Abdomen: Nondistended. Soft and nontender. No masses palpable. No organomegaly. No palpable hernias. Extremities: No edema or joint swelling or deformity. Trace ankle edema No chronic venous stasis changes. Neurologic: Alert and fully oriented. Psychiatric: Normal mood and affect. Thought content appropriate with normal judgement and insight    Assessment & Plan Melinda Moore T. Dalilah Curlin MD; 04/12/2016 12:32 PM) MORBID OBESITY WITH BMI OF 45.0-49.9, ADULT (E66.01) Impression: Patient with progressive morbid obesity unresponsive to multiple efforts at medical management who presents with a BMI of 45 and comorbidities of GERD and lower extremity pain.. I believe there would be very significant medical benefit from surgical weight loss. After our discussion of surgical options currently available the patient has decided to proceed with laparoscopic sleeve gastrectomy. We discussed that sleeve gastrectomies effect on reflux is variable and could make her reflux worse. However this is balanced by a somewhat less extensive operation and less operative time which should reduce her risk of DVT. Her reflux is not severe and I think this is reasonable. We reviewed the consent form and all her questions were answered today. She is given prescriptions for pain and nausea medication and Protonix. We discussed management of her DVT risk and I will plan a little more prolonged postoperative Lovenox at home.

## 2016-04-17 NOTE — Interval H&P Note (Signed)
History and Physical Interval Note:  04/17/2016 11:32 AM  Melinda Moore  has presented today for surgery, with the diagnosis of MORBID OBESITY  The various methods of treatment have been discussed with the patient and family. After consideration of risks, benefits and other options for treatment, the patient has consented to  Procedure(s): LAPAROSCOPIC GASTRIC SLEEVE RESECTION (N/A) as a surgical intervention .  The patient's history has been reviewed, patient examined, no change in status, stable for surgery.  I have reviewed the patient's chart and labs.  Questions were answered to the patient's satisfaction.     Jadore Mcguffin T

## 2016-04-17 NOTE — Op Note (Signed)
Preoperative diagnosis: laparoscopic sleeve gastrectomy  Postoperative diagnosis: Same   Procedure: Upper endoscopy   Surgeon: Bonnetta Allbee A Zachry Hopfensperger, M.D.  Anesthesia: Gen.   Indications for procedure: This patient was undergoing a laparoscopic sleeve gastrectomy.   Description of procedure: The endoscopy was placed in the mouth and into the oropharynx and under endoscopic vision it was advanced to the esophagogastric junction. The pouch was insufflated and no bleeding or bubbles were seen. The GEJ was identified at 40cm from the teeth.  No bleeding or leaks were detected. The scope was withdrawn without difficulty.   Diem Pagnotta A Lachelle Rissler, M.D. General, Bariatric, & Minimally Invasive Surgery [facility]    

## 2016-04-17 NOTE — Anesthesia Procedure Notes (Signed)
Procedure Name: Intubation Date/Time: 04/17/2016 12:12 PM Performed by: Brynlie Daza, Nuala Alpha Pre-anesthesia Checklist: Patient identified, Emergency Drugs available, Suction available, Patient being monitored and Timeout performed Patient Re-evaluated:Patient Re-evaluated prior to inductionOxygen Delivery Method: Circle system utilized Preoxygenation: Pre-oxygenation with 100% oxygen Intubation Type: IV induction Ventilation: Mask ventilation without difficulty Laryngoscope Size: Mac and 4 Grade View: Grade II Tube type: Oral Tube size: 7.5 mm Number of attempts: 1 Airway Equipment and Method: Stylet Placement Confirmation: ETT inserted through vocal cords under direct vision,  positive ETCO2,  CO2 detector and breath sounds checked- equal and bilateral Secured at: 22 cm Tube secured with: Tape Dental Injury: Teeth and Oropharynx as per pre-operative assessment

## 2016-04-17 NOTE — Transfer of Care (Signed)
Immediate Anesthesia Transfer of Care Note  Patient: Melinda Moore  Procedure(s) Performed: Procedure(s): LAPAROSCOPIC GASTRIC SLEEVE RESECTION WITH UPPER ENDOSCOPY (N/A)  Patient Location: PACU  Anesthesia Type:General  Level of Consciousness:  sedated, patient cooperative and responds to stimulation  Airway & Oxygen Therapy:Patient Spontanous Breathing and Patient connected to face mask oxgen  Post-op Assessment:  Report given to PACU RN and Post -op Vital signs reviewed and stable  Post vital signs:  Reviewed and stable  Last Vitals:  Vitals:   04/17/16 0853  BP: 123/77  Pulse: 93  Resp: 18  Temp: 36.8 C    Complications: No apparent anesthesia complications

## 2016-04-17 NOTE — Op Note (Signed)
Preoperative Diagnosis: MORBID OBESITY  Postoprative Diagnosis: MORBID OBESITY  Procedure: Procedure(s): LAPAROSCOPIC GASTRIC SLEEVE RESECTION WITH UPPER ENDOSCOPY   Surgeon: Glenna FellowsHoxworth, Ramell Wacha T   Assistants: Twana Firsthelsea Conner  Anesthesia:  General endotracheal anesthesia  Indications: Patient is a 45 year old female with progressive morbid obesity unresponsive to medical management who presents with a BMI of 45 and comorbidities of chronic joint pain and mild GERD. After extensive preoperative workup and discussion detailed elsewhere we have elected to proceed with laparoscopic sleeve gastrectomy for treatment of her morbid obesity.    Procedure Detail:  Patient was brought to the operating room, placed in the supine position on operating table, and general endotracheal anesthesia induced. She received preoperative IV antibiotics and subcutaneous heparin. PAS were in place. The abdomen was widely sterilely prepped and draped. Patient timeout was performed and correct procedure verified. Access was obtained with a 5 mm Optiview trocar in the left upper quadrant without difficulty and pneumoperitoneum established. Under direct vision a 5 mm trocar was placed laterally in the right upper quadrant, a 15 mm trocar at the base of the falciform ligament in the right upper abdomen, a 5 mm trocar above and to the left of the umbilicus for the camera port. Through a 5 mm subxiphoid site the [facility] retractor was placed in the left lobe of the liver elevated with excellent exposure of the stomach and hiatus. The patient was placed in steep reverse Trendelenburg. An additional 5 mm trocar was placed in the left lateral abdomen. A TA P block was performed bilaterally under direct vision with dilute Exparel. Beginning at the mid greater curve vasculature was dissected with the Harmonic scalpel and the lesser sac entered. The dissection progressed proximally along the greater curve working up toward the fundus.  Short gastric vessels were divided with the Harmonic scalpel. The fundus was completely freed down to the left crus and the left crus was thoroughly dissected to the esophageal hiatus. We then worked distally along the greater curve with the Harmonic scalpel dissecting the vasculature to a spot measured 5 cm from the pylorus. There were a moderate amount of filmy adhesions in the lesser sac to the posterior stomach which were completely taken down with sharp dissection until the stomach was completely freed to its lesser curve vasculature. The patient does have mild reflux and a normal upper GI series. I did not see a grossly obvious hiatal hernia. A balloon test was performed with the calibration tube and a 10 mL fill the balloon was pulled up snugly against the hiatus with no evidence of hernia. Following this the VisiG tube was passed orally and advanced under direct vision with this took the pylorus. It was positioned along the lesser curve stomach splayed out symmetrically and was placed on continuous suction. The sleeve was started with an additional firing of the 60 mm green load stapler at about 5 cm from the pylorus staying away from the incisura leaving extra room along the tube. A second green firing was then used to continue the staple line up past the incisura again leaving some extra room along next to the sizing tube. Following this the sleeve was completed with a single gold firing of the 60 mm stapler and 3 firings of the blue load 60 mm stapler staying closer to the VisiG along the proximal stomach and the final firing angling just out lateral to the esophageal fat pad. A few bleeding points along the staple line were controlled with clips. The sleeve was insufflated with  the VisiG and under saline irrigation there was no evidence of leak and the sleeve appeared symmetrical and no evidence of narrowing. Dr. Fredricka Bonine performed upper GI endoscopy again showing a symmetrical sleeve, no bleeding, no  evidence of leak under saline irrigation. The abdomen was thoroughly irrigated. There was no evidence of bleeding or trocar injury. The Nathanson retractor was removed under direct vision. The gastric specimen was removed through the 15 mm trocar site after dilating this slightly. The fascia at this site was closed with the Weck fascial closure device. All CO2 was evacuated and trochars removed. Skin incisions were closed with subcutaneous Vicryl and Monocryl and Dermabond. Sponge needle and instrument counts were correct.    Findings: As above  Estimated Blood Loss:  less than 50 mL         Drains: None  Blood Given: none          Specimens: Greater curvature of stomach        Complications:  * No complications entered in OR log *         Disposition: PACU - hemodynamically stable.         Condition: stable

## 2016-04-17 NOTE — Discharge Instructions (Addendum)
Aprepitant Discharge Instructions  °On the day of surgery you were given the medication aprepitant. This medication interacts with hormonal forms of birth control (oral contraceptives and injected or implanted birth control) and may make them ineffective. °IF YOU USE ANY HORMONAL FORM OF BIRTH CONTROL, YOU MUST USE AN ADDITIONAL BARRIER BIRTH CONTROL METHOD FOR ONE MONTH after receiving aprepitant or there is a chance you could become pregnant. ° ° ° ° °GASTRIC BYPASS/SLEEVE ° Home Care Instructions ° ° These instructions are to help you care for yourself when you go home. ° °Call: If you have any problems. °• Call [phone] and ask for the surgeon on call °• If you need immediate assistance come to the ER at [facility]. Tell the ER staff you are a new post-op gastric bypass or gastric sleeve patient  °Signs and symptoms to report: • Severe  vomiting or nausea °o If you cannot handle clear liquids for longer than 1 day, call your surgeon °• Abdominal pain which does not get better after taking your pain medication °• Fever greater than 100.4°  F and chills °• Heart rate over 100 beats a minute °• Trouble breathing °• Chest pain °• Redness,  swelling, drainage, or foul odor at incision (surgical) sites °• If your incisions open or pull apart °• Swelling or pain in calf (lower leg) °• Diarrhea (Loose bowel movements that happen often), frequent watery, uncontrolled bowel movements °• Constipation, (no bowel movements for 3 days) if this happens: °o Take Milk of Magnesia, 2 tablespoons by mouth, 3 times a day for 2 days if needed °o Stop taking Milk of Magnesia once you have had a bowel movement °o Call your doctor if constipation continues °Or °o Take Miralax  (instead of Milk of Magnesia) following the label instructions °o Stop taking Miralax once you have had a bowel movement °o Call your doctor if constipation continues °• Anything you think is “abnormal for you” °  °Normal side effects after surgery: • Unable  to sleep at night or unable to concentrate °• Irritability °• Being tearful (crying) or depressed ° °These are common complaints, possibly related to your anesthesia, stress of surgery, and change in lifestyle, that usually go away a few weeks after surgery. If these feelings continue, call your medical doctor.  °Wound Care: You may have surgical glue, steri-strips, or staples over your incisions after surgery °• Surgical glue: Looks like clear film over your incisions and will wear off a little at a time °• Steri-strips: Adhesive strips of tape over your incisions. You may notice a yellowish color on skin under the steri-strips. This is used to make the steri-strips stick better. Do not pull the steri-strips off - let them fall off °• Staples: Staples may be removed before you leave the hospital °o If you go home with staples, call [facility] for an appointment with your surgeon’s nurse to have staples removed 10 days after surgery, [phone] °• Showering: You may shower two (2) days after your surgery unless your surgeon tells you differently °o Wash gently around incisions with warm soapy water, rinse well, and gently pat dry °o If you have a drain (tube from your incision), you may need someone to hold this while you shower °o No tub baths until staples are removed and incisions are healed °  °Medications: • Medications should be liquid or crushed if larger than the size of a dime °• Extended release pills (medication that releases a little bit at   a time through the  day) should not be crushed °• Depending on the size and number of medications you take, you may need to space (take a few throughout the day)/change the time you take your medications so that you do not over-fill your pouch (smaller stomach) °• Make sure you follow-up with you primary care physician to make medication changes needed during rapid weight loss and life -style changes °• If you have diabetes, follow up with your  doctor that orders your diabetes medication(s) within one week after surgery and check your blood sugar regularly ° °• Do not drive while taking narcotics (pain medications) ° °• Do not take acetaminophen (Tylenol) and Roxicet or Lortab Elixir at the same time since these pain medications contain acetaminophen °  °Diet:  °First 2 Weeks You will see the nutritionist about two (2) weeks after your surgery. The nutritionist will increase the types of foods you can eat if you are handling liquids well: °• If you have severe vomiting or nausea and cannot handle clear liquids lasting longer than 1 day call your surgeon °Protein Shake °• Drink at least 2 ounces of shake 5-6 times per day °• Each serving of protein shakes (usually 8-12 ounces) should have a minimum of: °o 15 grams of protein °o And no more than 5 grams of carbohydrate °• Goal for protein each day: °o Men = 80 grams per day °o Women = 60 grams per day °  ° • Protein powder may be added to fluids such as non-fat milk or Lactaid milk or Soy milk (limit to 35 grams added protein powder per serving) ° °Hydration °• Slowly increase the amount of water and other clear liquids as tolerated (See Acceptable Fluids) °• Slowly increase the amount of protein shake as tolerated °• Sip fluids slowly and throughout the day °• May use sugar substitutes in small amounts (no more than 6-8 packets per day; i.e. Splenda) ° °Fluid Goal °• The first goal is to drink at least 8 ounces of protein shake/drink per day (or as directed by the nutritionist); some examples of protein shakes are Syntrax Nectar, Adkins Advantage, EAS Edge HP, and Unjury. - See handout from pre-op Bariatric Education Class: °o Slowly increase the amount of protein shake you drink as tolerated °o You may find it easier to slowly sip shakes throughout the day °o It is important to get your proteins in first °• Your fluid goal is to drink 64-100 ounces of fluid daily °o It may take a few weeks to build up to  this  °• 32 oz. (or more) should be clear liquids °And °• 32 oz. (or more) should be full liquids (see below for examples) °• Liquids should not contain sugar, caffeine, or carbonation ° °Clear Liquids: °• Water of Sugar-free flavored water (i.e. Fruit H²O, Propel) °• Decaffeinated coffee or tea (sugar-free) °• Crystal lite, Wyler’s Lite, Minute Maid Lite °• Sugar-free Jell-O °• Bouillon or broth °• Sugar-free Popsicle:    - Less than 20 calories each; Limit 1 per day ° °Full Liquids: °                  Protein Shakes/Drinks + 2 choices per day of other full liquids °• Full liquids must be: °o No More Than 12 grams of Carbs per serving °o No More Than 3 grams of Fat per serving °• Strained low-fat cream soup °• Non-Fat milk °• Fat-free Lactaid Milk °• Sugar-free yogurt (Dannon Lite & Fit, Greek   yogurt) ° °  °Vitamins and Minerals • Start 1 day after surgery unless otherwise directed by your surgeon °• 2 Chewable Multivitamin / Multimineral Supplement with iron (i.e. Centrum for Adults) °• Vitamin B-12, 350-500 micrograms sub-lingual (place tablet under the tongue) each day °• Chewable Calcium Citrate with Vitamin D-3 °(Example: 3 Chewable Calcium  Plus 600 with Vitamin D-3) °o Take 500 mg three (3) times a day for a total of 1500 mg each day °o Do not take all 3 doses of calcium at one time as it may cause constipation, and you can only absorb 500 mg at a time °o Do not mix multivitamins containing iron with calcium supplements;  take 2 hours apart °o Do not substitute Tums (calcium carbonate) for your calcium °• Menstruating women and those at risk for anemia ( a blood disease that causes weakness) may need extra iron °o Talk to your doctor to see if you need more iron °• If you need extra iron: Total daily Iron recommendation (including Vitamins) is 50 to 100 mg Iron/day °• Do not stop taking or change any vitamins or minerals until you talk to your nutritionist or surgeon °• Your nutritionist and/or surgeon must  approve all vitamin and mineral supplements °  °Activity and Exercise: It is important to continue walking at home. Limit your physical activity as instructed by your doctor. During this time, use these guidelines: °• Do not lift anything greater than ten  (10) pounds for at least two (2) weeks °• Do not go back to work or drive until your surgeon says you can °• You may have sex when you feel comfortable °o It is VERY important for female patients to use a reliable birth control method; fertility often increase after surgery °o Do not get pregnant for at least 18 months °• Start exercising as soon as your doctor tells you that you can °o Make sure your doctor approves any physical activity °• Start with a simple walking program °• Walk 5-15 minutes each day, 7 days per week °• Slowly increase until you are walking 30-45 minutes per day °• Consider joining our BELT program. [phone] or email [email or web address] °  °Special Instructions Things to remember: °• Free counseling is available for you and your family through collaboration between [employer] and [employer]. Please call [phone] and leave a message °• Use your CPAP when sleeping if this applies to you °• Consider buying a medical alert bracelet that says you had lap-band surgery °  °  You will likely have your first fill (fluid added to your band) 6 - 8 weeks after surgery °• [facility] has a free Bariatric Surgery Support Group that meets monthly, the 3rd Thursday, 6pm. [employer] [facility] Classrooms. You can see classes online at [email or web address] °• It is very important to keep all follow up appointments with your surgeon, nutritionist, primary care physician, and behavioral health practitioner °o After the first year, please follow up with your bariatric surgeon and nutritionist at least once a year in order to maintain best weight loss results °      °             [facility]:  [phone] ° °             [facility]: [phone] ° °             Bariatric Nurse Coordinator: [phone]  °Gastric Bypass/Sleeve Home Care   Instructions  Rev. 07/2012    ° °                                                    Reviewed and Endorsed °                                                   by [facility] Patient Education Committee, Jan, 2014 ° ° ° ° ° ° ° ° ° °

## 2016-04-18 LAB — CBC WITH DIFFERENTIAL/PLATELET
BASOS PCT: 0 %
Basophils Absolute: 0 10*3/uL (ref 0.0–0.1)
Eosinophils Absolute: 0 10*3/uL (ref 0.0–0.7)
Eosinophils Relative: 0 %
HEMATOCRIT: 33.5 % — AB (ref 36.0–46.0)
Hemoglobin: 10.7 g/dL — ABNORMAL LOW (ref 12.0–15.0)
LYMPHS PCT: 10 %
Lymphs Abs: 1 10*3/uL (ref 0.7–4.0)
MCH: 26.8 pg (ref 26.0–34.0)
MCHC: 31.9 g/dL (ref 30.0–36.0)
MCV: 83.8 fL (ref 78.0–100.0)
MONO ABS: 1 10*3/uL (ref 0.1–1.0)
MONOS PCT: 9 %
NEUTROS ABS: 8.8 10*3/uL — AB (ref 1.7–7.7)
Neutrophils Relative %: 81 %
Platelets: 358 10*3/uL (ref 150–400)
RBC: 4 MIL/uL (ref 3.87–5.11)
RDW: 15.3 % (ref 11.5–15.5)
WBC: 10.8 10*3/uL — ABNORMAL HIGH (ref 4.0–10.5)

## 2016-04-18 LAB — HEMOGLOBIN AND HEMATOCRIT, BLOOD
HCT: 34.4 % — ABNORMAL LOW (ref 36.0–46.0)
Hemoglobin: 11 g/dL — ABNORMAL LOW (ref 12.0–15.0)

## 2016-04-18 MED ORDER — CARBAMAZEPINE ER 200 MG PO TB12
200.0000 mg | ORAL_TABLET | Freq: Two times a day (BID) | ORAL | Status: DC
  Administered 2016-04-18 – 2016-04-19 (×3): 200 mg via ORAL
  Filled 2016-04-18 (×3): qty 1

## 2016-04-18 MED ORDER — VENLAFAXINE HCL ER 75 MG PO CP24
75.0000 mg | ORAL_CAPSULE | Freq: Three times a day (TID) | ORAL | Status: DC
  Administered 2016-04-18 – 2016-04-19 (×4): 75 mg via ORAL
  Filled 2016-04-18 (×4): qty 1

## 2016-04-18 MED ORDER — CHLORPROMAZINE HCL 25 MG/ML IJ SOLN
25.0000 mg | Freq: Three times a day (TID) | INTRAMUSCULAR | Status: DC | PRN
  Filled 2016-04-18: qty 1

## 2016-04-18 NOTE — Addendum Note (Signed)
Addendum  created 04/18/16 [id number] by Theodosia QuayKristopher Deziyah Arvin, CRNA   Anesthesia Intra Meds edited

## 2016-04-18 NOTE — Progress Notes (Signed)
Patient alert and oriented, Post op day 1.  Provided support and encouragement.  Encouraged pulmonary toilet, ambulation and small sips of liquids.  All questions answered.  Will continue to monitor. 

## 2016-04-18 NOTE — Plan of Care (Signed)
Problem: Food- and Nutrition-Related Knowledge Deficit (NB-1.1) Goal: Nutrition education Formal process to instruct or train a patient/client in a skill or to impart knowledge to help patients/clients voluntarily manage or modify food choices and eating behavior to maintain or improve health. Outcome: Completed/Met Date Met: 04/18/16 Nutrition Education Note  Received consult for diet education per DROP protocol.   Discussed 2 week post op diet with pt. Emphasized that liquids must be non carbonated, non caffeinated, and sugar free. Fluid goals discussed. Reviewed progression of diet to include soft proteins at 7-10 days post-op. Pt to follow up with outpatient bariatric RD for further diet progression after 2 weeks. Multivitamins and minerals also reviewed. Teach back method used, pt expressed understanding, expect good compliance.   Diet: First 2 Weeks  You will see the dietitian about two (2) weeks after your surgery. The dietitian will increase the types of foods you can eat if you are handling liquids well:  If you have severe vomiting or nausea and cannot handle clear liquids lasting longer than 1 day, call your surgeon  Protein Shake  Drink at least 2 ounces of shake 5-6 times per day  Each serving of protein shakes (usually 8 - 12 ounces) should have a minimum of:  15 grams of protein  And no more than 5 grams of carbohydrate  Goal for protein each day:  Men = 80 grams per day  Women = 60 grams per day  Protein powder may be added to fluids such as non-fat milk or Lactaid milk or Soy milk (limit to 35 grams added protein powder per serving)   Hydration  Slowly increase the amount of water and other clear liquids as tolerated (See Acceptable Fluids)  Slowly increase the amount of protein shake as tolerated  Sip fluids slowly and throughout the day  May use sugar substitutes in small amounts (no more than 6 - 8 packets per day; i.e. Splenda)   Fluid Goal  The first goal is to  drink at least 8 ounces of protein shake/drink per day (or as directed by the nutritionist); some examples of protein shakes are Syntrax Nectar, Adkins Advantage, EAS Edge HP, and Unjury. See handout from pre-op Bariatric Education Class:  Slowly increase the amount of protein shake you drink as tolerated  You may find it easier to slowly sip shakes throughout the day  It is important to get your proteins in first  Your fluid goal is to drink 64 - 100 ounces of fluid daily  It may take a few weeks to build up to this  32 oz (or more) should be clear liquids  And  32 oz (or more) should be full liquids (see below for examples)  Liquids should not contain sugar, caffeine, or carbonation   Clear Liquids:  Water or Sugar-free flavored water (i.e. Fruit H2O, Propel)  Decaffeinated coffee or tea (sugar-free)  Crystal Lite, Wyler's Lite, Minute Maid Lite  Sugar-free Jell-O  Bouillon or broth  Sugar-free Popsicle: *Less than 20 calories each; Limit 1 per day   Full Liquids:  Protein Shakes/Drinks + 2 choices per day of other full liquids  Full liquids must be:  No More Than 12 grams of Carbs per serving  No More Than 3 grams of Fat per serving  Strained low-fat cream soup  Non-Fat milk  Fat-free Lactaid Milk  Sugar-free yogurt (Dannon Lite & Fit, Greek yogurt)     Braven Wolk, MS, RD, LDN Pager: [phone] After Hours Pager: [phone]    

## 2016-04-18 NOTE — Progress Notes (Signed)
Patient ID: Melinda Moore, female   DOB: 1971/01/28, 45 y.o.   MRN: [id number] 1 Day Post-Op  Subjective: Doing pretty well. Has some nausea but no vomiting. Hiccoughs are main complaint which causes abdominal cramping. No severe pain.  Objective: Vital signs in last 24 hours: Temp:  [98 F (36.7 C)-98.7 F (37.1 C)] 98.5 F (36.9 C) (11/08 0503) Pulse Rate:  [92-109] 95 (11/08 0503) Resp:  [10-23] 15 (11/08 0503) BP: (117-138)/(62-84) 132/72 (11/08 0503) SpO2:  [98 %-100 %] 99 % (11/08 0503) Weight:  [126.6 kg (279 lb)] 126.6 kg (279 lb) (11/07 0911) Last BM Date: 04/15/16  Intake/Output from previous day: 11/07 0701 - 11/08 0700 In: 2408.3 [I.V.:2358.3; IV Piggyback:50] Out: 1150 [Urine:1100; Blood:50] Intake/Output this shift: No intake/output data recorded.  General appearance: alert, cooperative and no distress Resp: clear to auscultation bilaterally GI: normal findings: soft, non-tender Incision/Wound: Clean and dry  Lab Results:   Recent Labs  04/17/16 2016 04/18/16 0526  WBC  --  10.8*  HGB 11.3* 10.7*  HCT 35.1* 33.5*  PLT  --  358   BMET No results for input(s): NA, K, CL, CO2, GLUCOSE, BUN, CREATININE, CALCIUM in the last 72 hours.   Studies/Results: No results found.  Anti-infectives: Anti-infectives    Start     Dose/Rate Route Frequency Ordered Stop   04/17/16 0848  cefoTEtan in Dextrose 5% (CEFOTAN) IVPB 2 g     2 g Intravenous On call to O.R. 04/17/16 0849 04/17/16 1215      Assessment/Plan: s/p Procedure(s): LAPAROSCOPIC GASTRIC SLEEVE RESECTION WITH UPPER ENDOSCOPY Doing well postoperatively without apparent complication. She has not yet been started on any oral intake despite orders. Start ice and water this morning and discussed with nurse at bedside. Advance to protein shakes as tolerated. Anticipate discharge tomorrow.   LOS: 1 day    Karman Biswell T 04/18/2016

## 2016-04-19 LAB — CBC WITH DIFFERENTIAL/PLATELET
BASOS ABS: 0 10*3/uL (ref 0.0–0.1)
BASOS PCT: 0 %
EOS ABS: 0 10*3/uL (ref 0.0–0.7)
EOS PCT: 0 %
HCT: 33 % — ABNORMAL LOW (ref 36.0–46.0)
HEMOGLOBIN: 10.6 g/dL — AB (ref 12.0–15.0)
Lymphocytes Relative: 17 %
Lymphs Abs: 1.4 10*3/uL (ref 0.7–4.0)
MCH: 26.8 pg (ref 26.0–34.0)
MCHC: 32.1 g/dL (ref 30.0–36.0)
MCV: 83.3 fL (ref 78.0–100.0)
Monocytes Absolute: 0.7 10*3/uL (ref 0.1–1.0)
Monocytes Relative: 8 %
NEUTROS PCT: 75 %
Neutro Abs: 6.1 10*3/uL (ref 1.7–7.7)
PLATELETS: 335 10*3/uL (ref 150–400)
RBC: 3.96 MIL/uL (ref 3.87–5.11)
RDW: 15.2 % (ref 11.5–15.5)
WBC: 8.2 10*3/uL (ref 4.0–10.5)

## 2016-04-19 MED ORDER — ENOXAPARIN SODIUM 30 MG/0.3ML ~~LOC~~ SOLN: 30.0000 mg | Freq: Two times a day (BID) | SUBCUTANEOUS | 0 refills | Status: DC

## 2016-04-19 NOTE — Progress Notes (Signed)
Patient alert and oriented, pain is controlled. Patient is tolerating fluids, advanced to protein shake today, patient is tolerating well.  Reviewed Gastric sleeve discharge instructions with patient and patient is able to articulate understanding.  Provided information on BELT program, Support Group and [facility] outpatient pharmacy. All questions answered, will continue to monitor.  

## 2016-04-19 NOTE — Progress Notes (Signed)
2 Days Post-Op  Subjective: Doing pretty well this morning. Mild pain easily controlled. Nausea resolved and tolerating protein shakes. Has been ambulatory.  Objective: Vital signs in last 24 hours: Temp:  [98.7 F (37.1 C)-99.7 F (37.6 C)] 98.9 F (37.2 C) (11/09 0539) Pulse Rate:  [88-96] 96 (11/09 0539) Resp:  [14-16] 16 (11/09 0539) BP: (121-142)/(62-78) 142/76 (11/09 0539) SpO2:  [98 %-100 %] 99 % (11/09 0539) Last BM Date: 04/15/16  Intake/Output from previous day: 11/08 0701 - 11/09 0700 In: 3623.3 [P.O.:140; I.V.:3383.3; IV Piggyback:100] Out: 2750 [Urine:2750] Intake/Output this shift: No intake/output data recorded.  General appearance: alert, cooperative and no distress GI: Mild appropriate upper abdominal tenderness Incision/Wound: No erythema or drainage  Lab Results:   Recent Labs  04/18/16 0526 04/18/16 1625 04/19/16 0430  WBC 10.8*  --  8.2  HGB 10.7* 11.0* 10.6*  HCT 33.5* 34.4* 33.0*  PLT 358  --  335   BMET No results for input(s): NA, K, CL, CO2, GLUCOSE, BUN, CREATININE, CALCIUM in the last 72 hours.   Studies/Results: No results found.  Anti-infectives: Anti-infectives    Start     Dose/Rate Route Frequency Ordered Stop   04/17/16 0848  cefoTEtan in Dextrose 5% (CEFOTAN) IVPB 2 g     2 g Intravenous On call to O.R. 04/17/16 0849 04/17/16 1215      Assessment/Plan: s/p Procedure(s): LAPAROSCOPIC GASTRIC SLEEVE RESECTION WITH UPPER ENDOSCOPY Doing well without apparent complication. Okay for discharge today.   LOS: 2 days    Mandolin Falwell T 11/9/2017Patient ID: Melinda Moore, female   DOB: [birth year], 45 y.o.   MRN: [id number]

## 2016-04-19 NOTE — Discharge Summary (Signed)
Patient ID: Melinda Moore [id number] 45 y.o. 1970-07-18  04/17/2016  Discharge date and time: 04/19/2016   Admitting Physician: Glenna FellowsHOXWORTH,Charlye Spare T  Discharge Physician: Glenna FellowsHOXWORTH,Samhitha Rosen T  Admission Diagnoses: MORBID OBESITY  Discharge Diagnoses: Same  Operations: Procedure(s): LAPAROSCOPIC GASTRIC SLEEVE RESECTION WITH UPPER ENDOSCOPY  Admission Condition: fair  Discharged Condition: fair  Indication for Admission: Patient is a 45 year old female with progressive morbid obesity unresponsive to medical management who presents at a BMI of 45 and comorbidities of GERD and lower extremity pain. After extensive preoperative discussion and workup detailed elsewhere she is a bit electively for laparoscopic sleeve gastrectomy.  Hospital Course: Patient underwent an uneventful laparoscopic sleeve gastrectomy. Her postoperative course was uncomplicated. On the first postoperative day she had some expected discomfort and nausea and was not ready for discharge. By the second postoperative day pain and nausea much improved. Tolerating protein shakes. Ambulatory. Vital signs are stable and CBC unremarkable. Abdomen is benign and wounds are clean. Due to a history of his recurrent DVT we will plan to continue subcutaneous Lovenox as an outpatient for 3 weeks.   Disposition: Home  Patient Instructions:    Medication List    TAKE these medications   aspirin EC 81 MG tablet Take 81 mg by mouth daily as needed for moderate pain. Notes to patient:  Avoid NSAIDs for 6-8 weeks after surgery   BIOFLEX Tabs Take 2 tablets by mouth daily.   BLINK TEARS 0.25 % Soln Generic drug:  Polyethylene Glycol 400 Place 1 drop into the right eye 2 (two) times daily as needed (dry eyes).   carbamazepine 200 MG 12 hr tablet Commonly known as:  TEGRETOL XR TAKE ONE TABLET 3 TIMES A DAY AS NEEDED. What changed:  See the new instructions.   cholecalciferol 1000 units tablet Commonly known as:  VITAMIN  D Take 1,000 Units by mouth daily.   diazepam 5 MG tablet Commonly known as:  VALIUM Take 5 mg by mouth at bedtime as needed for muscle spasms. For anxiety   DULoxetine 30 MG capsule Commonly known as:  CYMBALTA Take 1 capsule (30 mg total) by mouth daily.   EMERGEN-C IMMUNE Pack Take 1 packet by mouth daily as needed (immune support).   enoxaparin 30 MG/0.3ML injection Commonly known as:  LOVENOX Inject 0.3 mLs (30 mg total) into the skin every 12 (twelve) hours.   gabapentin 300 MG capsule Commonly known as:  NEURONTIN Take 1 capsule (300 mg total) by mouth 3 (three) times daily. What changed:  how much to take  additional instructions   GILENYA 0.5 MG Caps Generic drug:  Fingolimod HCl Take 1 capsule by mouth daily.   LINZESS 145 MCG Caps capsule Generic drug:  linaclotide TAKE ONE (1) CAPSULE EACH DAY   metaxalone 800 MG tablet Commonly known as:  SKELAXIN Take 400 mg by mouth daily.   methylphenidate 10 MG tablet Commonly known as:  RITALIN 1 qam and 1q noon   multivitamin tablet Take 1 tablet by mouth daily.   oxyCODONE-acetaminophen 10-325 MG tablet Commonly known as:  PERCOCET Take 1 tablet by mouth every 8 (eight) hours as needed for pain.   potassium chloride SA 20 MEQ tablet Commonly known as:  K-DUR,KLOR-CON TAKE ONE TABLET TWICE DAILY   SUPER B COMPLEX PO Take 1 tablet by mouth daily.   torsemide 20 MG tablet Commonly known as:  DEMADEX TAKE TWO TABLETS IN THE MORNING AND ONE TABLET AT NOON IF NEEDED What changed:  See the new instructions.  venlafaxine XR 75 MG 24 hr capsule Commonly known as:  EFFEXOR-XR Take 75 mg by mouth 3 (three) times daily.   vitamin E 1000 UNIT capsule Take 1,000 Units by mouth daily.       Activity: activity as tolerated Diet: Bariatric protein shakes Wound Care: none needed  Follow-up:  With Dr. Johna Sheriff in 3 weeks.  Signed: Mariella Saa MD, FACS  04/19/2016, 8:38 AM

## 2016-04-20 ENCOUNTER — Encounter: Payer: Self-pay | Admitting: Family Medicine

## 2016-04-26 ENCOUNTER — Encounter (HOSPITAL_COMMUNITY)
Admission: RE | Admit: 2016-04-26 | Discharge: 2016-04-26 | Disposition: A | Discharge status: Home / Self Care | Payer: Medicare Other | Source: Ambulatory Visit | Attending: Family Medicine | Admitting: Family Medicine

## 2016-04-26 DIAGNOSIS — Z95828 Presence of other vascular implants and grafts: Secondary | ICD-10-CM | POA: Insufficient documentation

## 2016-04-26 MED ORDER — HEPARIN SOD (PORK) LOCK FLUSH 100 UNIT/ML IV SOLN
500.0000 [IU] | INTRAVENOUS | Status: AC | PRN
  Administered 2016-04-26: 500 [IU]

## 2016-04-26 MED ORDER — HEPARIN SOD (PORK) LOCK FLUSH 100 UNIT/ML IV SOLN
INTRAVENOUS | Status: AC
  Filled 2016-04-26: qty 5

## 2016-04-26 MED ORDER — SODIUM CHLORIDE 0.9% FLUSH
10.0000 mL | INTRAVENOUS | Status: AC | PRN
  Administered 2016-04-26: 10 mL

## 2016-05-01 DIAGNOSIS — Z6841 Body Mass Index (BMI) 40.0 and over, adult: Secondary | ICD-10-CM | POA: Diagnosis not present

## 2016-05-01 DIAGNOSIS — Z713 Dietary counseling and surveillance: Secondary | ICD-10-CM | POA: Diagnosis not present

## 2016-05-05 NOTE — Progress Notes (Signed)
Bariatric Class:  Appt start time: 1530 end time:  1630.  2 Week Post-Operative Nutrition Class  Patient was seen on 05/01/16 for Post[facility]ative [facility]   Surgery date: 04/17/2016 Surgery type: sleeve gastrectomy Start weight at [facility]: 290 lbs on 11/02/2015 Weight today: 262.0 lbs  Weight change: 17 lbs  TANITA  BODY COMP RESULTS  04/16/16 05/01/16   BMI (kg/m^2) 43.7 41.0   Fat Mass (lbs) 152.8 147.0   Fat Free Mass (lbs) 126.2 115.0   Total Body Water (lbs) 93.8 85.2   The following the learning objectives were met by the patient during this course:  Identifies Phase 3A (Soft, High Proteins) Dietary Goals and will begin from 2 weeks post-operatively to 2 months post-operatively  Identifies appropriate sources of fluids and proteins   States protein recommendations and appropriate sources post-operatively  Identifies the need for appropriate texture modifications, mastication, and bite sizes when consuming solids  Identifies appropriate multivitamin and calcium sources post-operatively  Describes the need for physical activity post-operatively and will follow MD recommendations  States when to call healthcare provider regarding medication questions or post-operative complications  Handouts given during class include:  Phase 3A: Soft, High Protein Diet Handout  Follow-Up Plan: Patient will follow-up at [facility] in 6 weeks for 2 month post-op nutrition visit for diet advancement per MD.

## 2016-06-13 ENCOUNTER — Ambulatory Visit: Payer: Medicare Other | Admitting: Family Medicine

## 2016-06-14 ENCOUNTER — Ambulatory Visit: Payer: Self-pay | Admitting: Skilled Nursing Facility1

## 2016-06-21 ENCOUNTER — Ambulatory Visit (INDEPENDENT_AMBULATORY_CARE_PROVIDER_SITE_OTHER): Payer: Medicare Other | Admitting: Family Medicine

## 2016-06-21 ENCOUNTER — Encounter: Payer: Self-pay | Admitting: Family Medicine

## 2016-06-21 ENCOUNTER — Encounter (HOSPITAL_COMMUNITY)
Admission: RE | Admit: 2016-06-21 | Discharge: 2016-06-21 | Disposition: A | Discharge status: Home / Self Care | Payer: Medicare Other | Source: Ambulatory Visit | Attending: Family Medicine | Admitting: Family Medicine

## 2016-06-21 VITALS — BP 114/78 | Ht 69.0 in | Wt 244.5 lb

## 2016-06-21 DIAGNOSIS — G894 Chronic pain syndrome: Secondary | ICD-10-CM

## 2016-06-21 DIAGNOSIS — D508 Other iron deficiency anemias: Secondary | ICD-10-CM | POA: Diagnosis not present

## 2016-06-21 DIAGNOSIS — M5431 Sciatica, right side: Secondary | ICD-10-CM | POA: Diagnosis not present

## 2016-06-21 DIAGNOSIS — R600 Localized edema: Secondary | ICD-10-CM

## 2016-06-21 DIAGNOSIS — G35 Multiple sclerosis: Secondary | ICD-10-CM | POA: Diagnosis not present

## 2016-06-21 MED ORDER — METHYLPHENIDATE HCL 10 MG PO TABS: ORAL_TABLET | ORAL | 0 refills | Status: DC

## 2016-06-21 MED ORDER — OXYCODONE-ACETAMINOPHEN 10-325 MG PO TABS: 1.0000 | ORAL_TABLET | Freq: Three times a day (TID) | ORAL | 0 refills | Status: DC | PRN

## 2016-06-21 NOTE — Progress Notes (Signed)
   Subjective:    Patient ID: Melinda Moore, female    DOB: 1970-06-24, 46 y.o.   MRN: [id number]  HPI This patient was seen today for chronic pain  The medication list was reviewed and updated.   -Compliance with medication: yes  - Number patient states they take daily: 2 1/2 tablets daily   -when was the last dose patient took: today   The patient was advised the importance of maintaining medication and not using illegal substances with these.  Refills needed: yes  The patient was educated that we can provide 3 monthly scripts for their medication, it is their responsibility to follow the instructions.  Side effects or complications from medications: none  Patient is aware that pain medications are meant to minimize the severity of the pain to allow their pain levels to improve to allow for better function. They are aware of that pain medications cannot totally remove their pain.  Due for UDT ( at least once per year) : completed, utd 25 minutes was spent with the patient. Greater than half the time was spent in discussion and answering questions and counseling regarding the issues that the patient came in for today.  Patient has no new concerns at this time.  Patient has constipation uses medication for does good job She has muscle weakness related to the MS the methylphenidate helps  Patient with sciatica uses Neurontin does help Has history of depression takes medication it does help.   Review of Systems She relates low back pain some pain into the right leg as well she also relates occasional pedal edema have any use diuretics Also relates MS fatigue tiredness history of anemia    Objective:   Physical Exam Lungs clear no crackles heart regular extremities trace edema       Assessment & Plan:  The patient was seen today as part of a comprehensive visit regarding pain control. Patient's compliance with the medication as well as discussion regarding effectiveness was  completed. Prescriptions were written. Patient was advised to follow-up in 3 months. The patient was assessed for any signs of severe side effects. The patient was advised to take the medicine as directed and to report to [place] if any side effect issues.  This patient does have MS and has low motor activity because of it she uses methylphenidate it does help her with her overall energy. She needs 3 scripts on this denies abusing it  History of gastric sleeve she is losing weight she is watching her diet staying active is best that she can't  MS is under decent control she is followed by specialist for this. She does use gabapentin we discussed at length about the importance of trying to taper down on this to see if she doesn't need as much

## 2016-06-21 NOTE — Patient Instructions (Signed)

## 2016-06-25 ENCOUNTER — Telehealth: Payer: Self-pay | Admitting: Family Medicine
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
## 2016-07-02 ENCOUNTER — Ambulatory Visit: Payer: Self-pay | Admitting: Skilled Nursing Facility1

## 2016-07-05 ENCOUNTER — Encounter: Payer: Medicare Other | Attending: General Surgery | Admitting: Skilled Nursing Facility1

## 2016-07-05 ENCOUNTER — Encounter: Payer: Self-pay | Admitting: Skilled Nursing Facility1

## 2016-07-05 DIAGNOSIS — Z713 Dietary counseling and surveillance: Secondary | ICD-10-CM | POA: Insufficient documentation

## 2016-07-05 DIAGNOSIS — Z6841 Body Mass Index (BMI) 40.0 and over, adult: Secondary | ICD-10-CM | POA: Diagnosis not present

## 2016-07-05 DIAGNOSIS — E6609 Other obesity due to excess calories: Secondary | ICD-10-CM

## 2016-07-05 NOTE — Patient Instructions (Addendum)
-  Get back into the habit of taking bites the size of a dime and chewing those until applesauce consistency   -Try to avoid drinking 15 minutes before eating, while eating and 30 minutes after eating   -Look at the sugar and carbohydrate on your juice and lemonade  -Continue to stick with protein foods and non-starchy vegetables

## 2016-07-05 NOTE — Progress Notes (Signed)
  Follow-up visit:  8 Weeks Post-Operative Sleeve Gastrectomy Surgery  Medical Nutrition Therapy:  Appt start time: 2:00 end time:  2:40  Primary concerns today: Post-operative Bariatric Surgery Nutrition Management. Pt states she took a natural prednisone that caused her to snack so she discontinued it. Pt states she has been feeling a little tired which she feels is due to her MS and fibromyalgia.   Surgery date: 04/17/2016 Surgery type: sleeve gastrectomy Start weight at [facility]: 290 lbs on 11/02/2015 Weight today: 244 lbs  Weight change since last visit: 18 lbs  TANITA  BODY COMP RESULTS  04/16/16 05/01/16 07/05/2016   BMI (kg/m^2) 43.7 41.0 38.2   Fat Mass (lbs) 152.8 147.0 131.2   Fat Free Mass (lbs) 126.2 115.0 112.8   Total Body Water (lbs) 93.8 85.2 83   Preferred Learning Style:   No preference indicated   Learning Readiness:  Change in progress  24-hr recall: B (AM): protein shakes (30 grams protein) Snk (AM):  L (PM): grilled chicken salad (4 ounces: 28 grams protein)----cheese burger with the bottom bun Snk (PM):  D (PM): grilled chicken (4 ounces: 28 grams protein) and green beans or broccoli----canadian bacon and green beans  Snk (PM):   Fluid intake: 1 shake: 11 ounces, 24 ounces water, unsweet tea: 18 oz, lemonade: 18 oz, diluted juice 6 ounces: total ounces: Maybe 50 ounces Estimated total protein intake: 86 grams   Medications: See List Supplementation: Celebrate multi, iron and calcium   Using straws: NO Drinking while eating: YES; a sip when it feels like the food is getting stuck Hair loss: YES: states a little bit more in her comb then she is used to Carbonated beverages: NO N/V/D/C: NO, NO, NO, NO  Recent physical activity:  Walking, exercise bike-every day 2 times a day 10 minutes and then 30 minutes   Progress Towards Goal(s):  In progress.   Handouts given during visit include:  Protein + NS vegetables    Nutritional Diagnosis:  [place]-3.3  Overweight/obesity related to past poor dietary habits and physical inactivity as evidenced by patient w/ recent sleeve gastrectomy surgery following dietary guidelines for continued weight loss.     Intervention:  Nutrition counseling. Dietitian educated the pt on carbohydrates and sugar slowing her wt loss progress and other food options for her health. Goals: -Get back into the habit of taking bites the size of a dime and chewing those until applesauce consistency  -Try to avoid drinking 15 minutes before eating, while eating and 30 minutes after eating  -Look at the sugar and carbohydrate on your juice and lemonade -Continue to stick with protein foods and non-starchy vegetables Teaching Method Utilized:  Visual Auditory Hands on  Barriers to learning/adherence to lifestyle change: none identified   Demonstrated degree of understanding via:  Teach Back   Monitoring/Evaluation:  Dietary intake, exercise, lap, and body weight. Follow up in 3 months

## 2016-08-06 DIAGNOSIS — G35 Multiple sclerosis: Secondary | ICD-10-CM | POA: Diagnosis not present

## 2016-08-30 ENCOUNTER — Ambulatory Visit (HOSPITAL_COMMUNITY): Payer: Commercial Managed Care - HMO | Admitting: Physical Therapy

## 2016-08-30 DIAGNOSIS — M7918 Myalgia, other site: Secondary | ICD-10-CM | POA: Insufficient documentation

## 2016-08-30 DIAGNOSIS — M1711 Unilateral primary osteoarthritis, right knee: Secondary | ICD-10-CM | POA: Insufficient documentation

## 2016-08-30 DIAGNOSIS — F5101 Primary insomnia: Secondary | ICD-10-CM | POA: Insufficient documentation

## 2016-08-30 NOTE — Progress Notes (Signed)
Office Visit Note  Patient: Melinda Moore             Date of Birth: October 02, 1970           MRN: [id number]             PCP: Lilyan Punt, MD Referring: Babs Sciara, MD Visit Date: 09/11/2016 Occupation: @GUAROCC @    Subjective:  Right knee pain.   History of Present Illness: Melinda Moore is a 46 y.o. female with history of osteoarthritis. According to patient she's been having a lot of pain and discomfort in her right knee joint and right calf. She feels her right knee joint may be swollen. None of the other joints are painful. She continues to have some discomfort which is generalized. She states is difficult to differentiate between the pain from fibromyalgia and multiple sclerosis. Her neurologist has been concerned about her gait as well. She's going to physical therapy currently. Patient had gastric sleeve on 04/17/2016. She has lost 50 pounds since then.  Activities of Daily Living:  Patient reports morning stiffness for minutes.   Patient Reports nocturnal pain.  Difficulty dressing/grooming: Denies Difficulty climbing stairs: Reports Difficulty getting out of chair: Denies Difficulty using hands for taps, buttons, cutlery, and/or writing: Denies   Review of Systems  Constitutional: Positive for fatigue. Negative for night sweats, weight gain, weight loss and weakness.  HENT: Positive for mouth dryness. Negative for mouth sores, trouble swallowing, trouble swallowing and nose dryness.   Eyes: Negative for pain, redness, visual disturbance and dryness.  Respiratory: Negative for cough, shortness of breath and difficulty breathing.   Cardiovascular: Negative for chest pain, palpitations, hypertension, irregular heartbeat and swelling in legs/feet.  Gastrointestinal: Positive for constipation. Negative for blood in stool and diarrhea.  Endocrine: Negative for increased urination.  Genitourinary: Negative for vaginal dryness.  Musculoskeletal: Positive for arthralgias, joint  pain, myalgias, morning stiffness and myalgias. Negative for joint swelling, muscle weakness and muscle tenderness.  Skin: Negative for color change, rash, hair loss, skin tightness, ulcers and sensitivity to sunlight.  Allergic/Immunologic: Negative for susceptible to infections.  Neurological: Negative for dizziness, memory loss and night sweats.  Hematological: Negative for swollen glands.  Psychiatric/Behavioral: Negative for depressed mood and sleep disturbance. The patient is not nervous/anxious.     PMFS History:  Patient Active Problem List   Diagnosis Date Noted  . History of anxiety 09/08/2016  . Primary osteoarthritis of right knee 08/30/2016  . Primary insomnia 08/30/2016  . Myofascial pain 08/30/2016  . Sciatica, right side 12/12/2015  . Right knee pain 12/12/2015  . Morbid obesity (HCC) 08/12/2015  . Lumbago 02/10/2014  . Chronic anxiety 07/08/2013  . Swelling of breast 04/08/2013  . Chronic pain syndrome 03/11/2013  . Port catheter in place 12/31/2012  . Unspecified constipation 10/01/2012  . Anemia 10/01/2012  . Rectal bleeding 10/01/2012  . Elevated alkaline phosphatase level 10/01/2012  . MS (multiple sclerosis) (HCC) 09/02/2012  . DVT (deep venous thrombosis) (HCC) 09/02/2012  . ANEMIA 04/12/2010  . NAUSEA WITH VOMITING 04/12/2010  . OTHER DYSPHAGIA 04/12/2010    Past Medical History:  Diagnosis Date  . Anemia   . Anxiety   . Bell's palsy   . Depression   . DVT (deep venous thrombosis) (HCC) 09/02/2012   [place] on 02/13/2012 showed acute DVT in left calf veins and posterior tibial veins  . Fibromyalgia   . GERD (gastroesophageal reflux disease)   . History of blood transfusion   . HTN (  hypertension)   . Leukopenia   . MS (multiple sclerosis) (HCC) 1997  . Peripheral edema   . Port catheter in place 12/31/2012    Family History  Problem Relation Age of Onset  . Heart disease Father   . Hyperlipidemia Father   . Hypertension Sister   . Other Paternal  Uncle     Possible colon cancer, age greater than 8  . Cirrhosis Brother     etoh   Past Surgical History:  Procedure Laterality Date  . ABDOMINAL HYSTERECTOMY    . CHOLECYSTECTOMY    . ESOPHAGOGASTRODUODENOSCOPY  2011   Dr. Jena Gauss. Possible cervical esophageal with status post disruption with passage of Maloney dilator, glycol esophageal erythema, antral erosions. Biopsy from the stomach revealed minimal chronic inactive inflammation but negative for H. pylori  . LAPAROSCOPIC GASTRIC SLEEVE RESECTION N/A 04/17/2016   Procedure: LAPAROSCOPIC GASTRIC SLEEVE RESECTION WITH UPPER ENDOSCOPY;  Surgeon: Glenna Fellows, MD;  Location: [facility];  Service: General;  Laterality: N/A;  . TUBAL LIGATION  2001   Social History   Social History Narrative  . No narrative on file     Objective: Vital Signs: BP 110/76   Pulse 94   Resp 18   Ht 5\' 7"  (1.702 m)   Wt 240 lb (108.9 kg)   BMI 37.59 kg/m    Physical Exam  Constitutional: She is oriented to person, place, and time. She appears well-developed and well-nourished.  HENT:  Head: Normocephalic and atraumatic.  Eyes: Conjunctivae and EOM are normal.  Neck: Normal range of motion.  Cardiovascular: Normal rate, regular rhythm, normal heart sounds and intact distal pulses.   Pulmonary/Chest: Effort normal and breath sounds normal.  Abdominal: Soft. Bowel sounds are normal.  Lymphadenopathy:    She has no cervical adenopathy.  Neurological: She is alert and oriented to person, place, and time.  Skin: Skin is warm and dry. Capillary refill takes less than 2 seconds.  Psychiatric: She has a normal mood and affect. Her behavior is normal.  Nursing note and vitals reviewed.    Musculoskeletal Exam: C-spine and thoracic lumbar spine good range of motion. Shoulder joints elbow joints wrist joint MCPs PIPs DIPs with good range of motion with no synovitis. She has discomfort and pain with range of motion of her right knee joint. Some warmth  was noted in the right knee joint without any effusion. Although joints were good range of motion with no synovitis.  CDAI Exam: No CDAI exam completed.    Investigation: Findings:  February 2017:  CBC showed hemoglobin of 11.5.  Comprehensive metabolic panel showed alkaline phosphatase of 156 which was elevated.  Her sed was 40, which is better than her initial visit.  CK was normal.  TSH was normal.  ACE level, CCP, 14-3-3 eta, SPEP and immunoglobulins were all normal.   September 2016:  Her sed rate was 57.  C-reactive protein 12.3.  Parris was negative. Rheumatoid factor was negative.   07/25/2015 X-rays of the right shoulder joint, 2 views today, showed high-rising humerus without any glenohumeral joint or AC joint narrowing.  This was within normal limits.  Right knee joint x-rays, 2 views, showed moderate medial compartment narrowing and severe patellofemoral narrowing, consistent with moderate osteoarthritis and severe chondromalacia of the patella.       Imaging: No results found.  Speciality Comments: No specialty comments available.    Procedures:  Large Joint Inj Date/Time: 09/11/2016 2:55 PM Performed by: Pollyann Savoy Authorized by: Pollyann Savoy  Consent Given by:  Patient Site marked: the procedure site was marked   Timeout: prior to procedure the correct patient, procedure, and site was verified   Indications:  Pain and joint swelling Location:  Knee Site:  R knee Prep: patient was prepped and draped in usual sterile fashion   Needle Size:  27 G Needle Length:  1.5 inches Approach:  Medial Ultrasound Guidance: No   Fluoroscopic Guidance: No   Arthrogram: No   Medications:  1.5 mL lidocaine 1 %; 40 mg triamcinolone acetonide 40 MG/ML Aspiration Attempted: Yes   Aspirate amount (mL):  0 Patient tolerance:  Patient tolerated the procedure well with no immediate complications   Allergies: Ambien [zolpidem tartrate] and Tape   Assessment / Plan:       Visit Diagnoses: 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 She's having pain and discomfort in her right knee. After different treatment options were discussed per request right knee joint was injected with cortisone the procedures described above. I've advised her to contact us in case she has persistent problem and can be evaluated further. I will also call in a prescription for Voltaren gel.  Primary insomnia: Good sleep hygiene was discussed.  Myofascial pain: She does experience some generalized pain from fibromyalgia  Morbid obesity [facility]): Status post gastric sleeve November 2017. She has 50 pound weight loss. Need for regular exercise was discussed.   History of DVT (deep vein thrombosis)  History of multiple sclerosis  History of anxiety    Orders: Orders Placed This Encounter  Procedures  . Large Joint Injection/Arthrocentesis   Meds ordered this encounter  Medications  . diclofenac sodium (VOLTAREN) 1 % GEL    Sig: Apply 2 g topically 4 (four) times daily.    Dispense:  3 Tube    Refill:  2    Face-to-face time spent with patient was 30 minutes. 50% of time was spent in counseling and coordination of care.  Follow-Up Instructions: Return in about 6 months (around 03/13/2017) for Osteoarthritis, FMS.   Pollyann Savoy, MD  Note - This record has been created using Animal nutritionist.  Chart creation errors have been sought, but may not always  have been located. Such creation errors do not reflect on  the standard of medical care.

## 2016-09-04 ENCOUNTER — Encounter (HOSPITAL_COMMUNITY): Payer: Self-pay | Admitting: Physical Therapy

## 2016-09-04 ENCOUNTER — Ambulatory Visit (HOSPITAL_COMMUNITY): Payer: Commercial Managed Care - HMO | Attending: Nurse Practitioner | Admitting: Physical Therapy

## 2016-09-04 DIAGNOSIS — R262 Difficulty in walking, not elsewhere classified: Secondary | ICD-10-CM | POA: Diagnosis not present

## 2016-09-04 DIAGNOSIS — R29898 Other symptoms and signs involving the musculoskeletal system: Secondary | ICD-10-CM | POA: Diagnosis not present

## 2016-09-04 DIAGNOSIS — M6281 Muscle weakness (generalized): Secondary | ICD-10-CM | POA: Insufficient documentation

## 2016-09-04 DIAGNOSIS — R2681 Unsteadiness on feet: Secondary | ICD-10-CM | POA: Diagnosis not present

## 2016-09-04 NOTE — Therapy (Signed)
[facility] [address] [place], [place], [address] Phone: [phone]   Fax:  [phone]  Physical Therapy Evaluation  Patient Details  Name: Melinda Moore MRN: [id number] Date of Birth: 08/12/1970 Referring Provider: Rush Farmer Abbott   Encounter Date: 09/04/2016      PT End of Session - 09/04/16 1738    Visit Number 1   Number of Visits 17   Date for PT Re-Evaluation 10/02/16   Authorization Type [employer] HMO/[employer] Medicare (G-codes and KX)   Authorization Time Period 09/04/16 to 11/04/16   Authorization - Visit Number 1   Authorization - Number of Visits 10   PT Start Time 1432   PT Stop Time 1514   PT Time Calculation (min) 42 min   Activity Tolerance Patient tolerated treatment well   Behavior During Therapy [facility] for tasks assessed/performed      Past Medical History:  Diagnosis Date  . Anemia   . Anxiety   . Bell's palsy   . Depression   . DVT (deep venous thrombosis) (HCC) 09/02/2012   [place] on 02/13/2012 showed acute DVT in left calf veins and posterior tibial veins  . Fibromyalgia   . GERD (gastroesophageal reflux disease)   . History of blood transfusion   . HTN (hypertension)   . Leukopenia   . MS (multiple sclerosis) (HCC) 1997  . Peripheral edema   . Port catheter in place 12/31/2012    Past Surgical History:  Procedure Laterality Date  . ABDOMINAL HYSTERECTOMY    . CHOLECYSTECTOMY    . ESOPHAGOGASTRODUODENOSCOPY  2011   Dr. Jena Gauss. Possible cervical esophageal with status post disruption with passage of Maloney dilator, glycol esophageal erythema, antral erosions. Biopsy from the stomach revealed minimal chronic inactive inflammation but negative for H. pylori  . LAPAROSCOPIC GASTRIC SLEEVE RESECTION N/A 04/17/2016   Procedure: LAPAROSCOPIC GASTRIC SLEEVE RESECTION WITH UPPER ENDOSCOPY;  Surgeon: Glenna Fellows, MD;  Location: [facility];  Service: General;  Laterality: N/A;  . TUBAL LIGATION   2001    There were no vitals filed for this visit.       Subjective Assessment - 09/04/16 1439    Subjective Patient was diagnosed with MS in 1997; she had a baby in January 1997, and a couple of days later developed Bell's Palsy. They did more testing through the year and eventually came up with an MS diagnosis. Her main MS related complaints are R sided pain, intermittent L foot tingling, hearing difficulties, R eye dryness. Her balance is off, and this is actually one of the reasons she was referred here by her MD; patient also reports some stumbling and reports that her family has told her she is dragging her R foot some when she walks. She needs a cart to walk in [employer] or she will tend to lean forward. Her fatigue has not been a huge issue.    Pertinent History history of DVT, MS, history of Bell's palsy, fibromyalgia, HTN    Patient Stated Goals improve walking and balance, strengthening, reduce pain    Currently in Pain? Yes   Pain Score 9    Pain Location Leg  R side in general    Pain Orientation Right   Pain Descriptors / Indicators Aching;Discomfort;Burning   Pain Type Chronic pain   Pain Radiating Towards from knee down to toes    Pain Onset More than a month ago   Pain Frequency Constant   Aggravating Factors  not sure,  it is quite constant but sometimes will ease if she lays on R side sometimes    Pain Relieving Factors laying on R side    Effect of Pain on Daily Activities none, annoying             [facility] PT Assessment - 09/04/16 0001      Assessment   Medical Diagnosis MS    Referring Provider Rush Farmer Abbott    Onset Date/Surgical Date --  1997   Next MD Visit Sometime in May    Prior Therapy She has had PT in the past but years ago      Precautions   Precautions None     Balance Screen   Has the patient fallen in the past 6 months No   Has the patient had a decrease in activity level because of a fear of falling?  No   Is the patient reluctant  to leave their home because of a fear of falling?  No     Prior Function   Level of Independence Independent;Independent with basic ADLs;Independent with gait;Independent with transfers     AROM   Lumbar Flexion approx 40% limited, RFIS improves R LE pain    Lumbar Extension WNL/hinging noted; REIS no changes leg pain    Lumbar - Right Side Bend WFL    Lumbar - Left Side Bend [facility]      Strength   Right Hip Flexion 4+/5   Right Hip Extension 2/5   Right Hip ABduction 3/5   Left Hip Flexion 5/5   Left Hip Extension 2/5   Left Hip ABduction 4+/5   Right Knee Flexion 3+/5   Right Knee Extension 3+/5   Left Knee Flexion 4+/5   Left Knee Extension 5/5   Right Ankle Dorsiflexion 5/5   Left Ankle Dorsiflexion 5/5     Flexibility   Hamstrings mild tightness L, moderate R    Piriformis mild tightness B      Ambulation/Gait   Gait Comments reduced DF B but especially R, inversion noted R foot, reduced foot clearnance, felxed at hips      6 minute walk test results    Aerobic Endurance Distance Walked 615   Endurance additional comments , fatigued     Standardized Balance Assessment   Standardized Balance Assessment Dynamic Gait Index     Dynamic Gait Index   Level Surface Mild Impairment   Change in Gait Speed Mild Impairment   Gait with Horizontal Head Turns Mild Impairment   Gait with Vertical Head Turns Mild Impairment   Gait and Pivot Turn Moderate Impairment   Step Over Obstacle Moderate Impairment   Step Around Obstacles Moderate Impairment   Steps Moderate Impairment   Total Score 12                           PT Education - 09/04/16 1737    Education provided Yes   Education Details prognosis, POC; HEP to be given next session    Person(s) Educated Patient   Methods Explanation   Comprehension Verbalized understanding          PT Short Term Goals - 09/04/16 1742      PT SHORT TERM GOAL #1   Title Patient to be able to identify 5/5  safety factors in order to reduce fall risk and improve general safety    Time 4   Period Weeks   Status New  PT SHORT TERM GOAL #2   Title Patient to demonstrate improved gait mechancis, including increased ankle DF B, improved posture, improved foot clearnace B, and gait speed at least 1.78m/s in order to enhance general mobility with reduced fall risk    Time 4   Period Weeks   Status New     PT SHORT TERM GOAL #3   Title Patient to be able to ambulate 761ft during in order to improve community access and reduce fall risk    Time 4   Period Weeks   Status New     PT SHORT TERM GOAL #4   Title Patient to be consistently performing regular walking program at least 5 days per week and 20 minutes in order to improve general health stauts and functional activity tolerance    Time 4   Period Weeks   Status New           PT Long Term Goals - 09/04/16 1744      PT LONG TERM GOAL #1   Title Patient to demonstrate functional strength 4+/5 in all tested groups in order to reduce pain and improve general mobiltiy    Time 8   Period Weeks   Status New     PT LONG TERM GOAL #2   Title Patient to be able to tolerate at least 15 minutes of functional activity without fatigue in order to improve general mobility and safety    Time 8   Period Weeks   Status New     PT LONG TERM GOAL #3   Title Patient to score at least 18 on DGI in order to show improved mobility and reduced fall risk    Time 8   Period Weeks   Status New     PT LONG TERM GOAL #4   Title Patient to experience pain R LE no more than 3/10 in order to improve QOL and functional activity tolerance    Time 8   Period Weeks   Status New     PT LONG TERM GOAL #5   Title Patient to be independent in correctly and consistently performing advanced HEP in addtion to regular walking program in order to improve general health status, maintain functional gains, and assist in managing symptoms of progressive disorder     Time 8   Period Weeks   Status New               Plan - 09/04/16 1739    Clinical Impression Statement Patient arrives with MS that she reports she has had since 1997; she states that her biggest complaint at this time is pain in her R LE, however her family and MD have all noted significant gait and balance deficits recently. She states her MD is curious as to DPT thoughts on if LE pain is related to the lumbar spine. Examination reveals significant gait impairment as well as general unsteadiness with dynamic balance tasks, impaired functional strength, reduced functional activity tolerance, and poor core activation during dynamic activities. Able to reduce R LE pain with repeated lumbar flexion, and exacerbate R LE pain with SLR raise test; also noted reduced light touch sensation from L3-S1 dermatomes today. At this time suspect that R LE pain could possibly be related to lumbar spine pathology, however note that this could possibly be related to MS as well depending on potential location of MS plaques in the CNS. Recommend skilled PT services in order to address functional deficits, reduce  fall risk, and optimize overall level of function moving forwards.    Rehab Potential Good   Clinical Impairments Affecting Rehab Potential (+) appears motivated to participate in skilled PT services, relatively low level of MS related impairments; (-) obesity, sedentary lifestyle, progressive neuromuscular disorder   PT Frequency 2x / week   PT Duration 8 weeks   PT Treatment/Interventions ADLs/Self Care Home Management;DME Instruction;Gait training;Stair training;Functional mobility training;Therapeutic activities;Therapeutic exercise;Balance training;Neuromuscular re-education;Patient/family education;Manual techniques;Energy conservation   PT Next Visit Plan review initial eval and goals; assign balance/strength HEP; proximal strengthening, balance, gait training, Nustep, discuss MS support group     PT Home Exercise Plan assign 2nd session: balance/strength, walking program    Consulted and Agree with Plan of Care Patient      Patient will benefit from skilled therapeutic intervention in order to improve the following deficits and impairments:  Abnormal gait, Pain, Decreased coordination, Decreased mobility, Decreased activity tolerance, Decreased strength, Decreased balance, Difficulty walking, Decreased safety awareness  Visit Diagnosis: Muscle weakness (generalized) - Plan: PT plan of care cert/re-cert  Unsteadiness on feet - Plan: PT plan of care cert/re-cert  Difficulty in walking, not elsewhere classified - Plan: PT plan of care cert/re-cert  Other symptoms and signs involving the musculoskeletal system - Plan: PT plan of care cert/re-cert      G-Codes - Sep 08, 2016 1751    Functional Assessment Tool Used (Outpatient Only) Based on skilled clinical assessment of strength, gait, balance, possible lumbar involvement, fall risk    Functional Limitation Mobility: Walking and moving around   Mobility: Walking and Moving Around Current Status (U0454) At least 40 percent but less than 60 percent impaired, limited or restricted   Mobility: Walking and Moving Around Goal Status [phone]) At least 20 percent but less than 40 percent impaired, limited or restricted       Problem List Patient Active Problem List   Diagnosis Date Noted  . Primary osteoarthritis of right knee 08/30/2016  . Primary insomnia 08/30/2016  . Myofascial pain 08/30/2016  . Sciatica, right side 12/12/2015  . Right knee pain 12/12/2015  . Morbid obesity (HCC) 08/12/2015  . Lumbago 02/10/2014  . Chronic anxiety 07/08/2013  . Swelling of breast 04/08/2013  . Chronic pain syndrome 03/11/2013  . Port catheter in place 12/31/2012  . Unspecified constipation 10/01/2012  . Anemia 10/01/2012  . Rectal bleeding 10/01/2012  . Elevated alkaline phosphatase level 10/01/2012  . MS (multiple sclerosis) (HCC) 09/02/2012   . DVT (deep venous thrombosis) (HCC) 09/02/2012  . ANEMIA 04/12/2010  . NAUSEA WITH VOMITING 04/12/2010  . OTHER DYSPHAGIA 04/12/2010    Nedra Hai PT, DPT [phone]  [facility] [address] [place], [place], [address] Phone: [phone]   Fax:  [phone]  Name: Melinda Moore MRN: [id number] Date of Birth: Jan 31, 1971

## 2016-09-06 ENCOUNTER — Other Ambulatory Visit: Payer: Self-pay | Admitting: Family Medicine

## 2016-09-06 ENCOUNTER — Ambulatory Visit (HOSPITAL_COMMUNITY): Payer: Commercial Managed Care - HMO | Admitting: Physical Therapy

## 2016-09-06 DIAGNOSIS — R29898 Other symptoms and signs involving the musculoskeletal system: Secondary | ICD-10-CM | POA: Diagnosis not present

## 2016-09-06 DIAGNOSIS — M6281 Muscle weakness (generalized): Secondary | ICD-10-CM | POA: Diagnosis not present

## 2016-09-06 DIAGNOSIS — R2681 Unsteadiness on feet: Secondary | ICD-10-CM | POA: Diagnosis not present

## 2016-09-06 DIAGNOSIS — R262 Difficulty in walking, not elsewhere classified: Secondary | ICD-10-CM | POA: Diagnosis not present

## 2016-09-06 NOTE — Therapy (Signed)
[facility] [address] [place], [place], [address] Phone: [phone]   Fax:  [phone]  Physical Therapy Treatment  Patient Details  Name: Melinda Moore MRN: [id number] Date of Birth: [birth year] Referring Provider: Rush Farmer Abbott   Encounter Date: 09/06/2016      PT End of Session - 09/06/16 1742    Visit Number 2   Number of Visits 17   Date for PT Re-Evaluation 10/02/16   Authorization Type [employer] HMO/[employer] Medicare (G-codes and KX)   Authorization Time Period 09/04/16 to 11/04/16   Authorization - Visit Number 2   Authorization - Number of Visits 10   PT Start Time 1655   PT Stop Time 1734   PT Time Calculation (min) 39 min   Activity Tolerance Patient tolerated treatment well   Behavior During Therapy [facility] for tasks assessed/performed      Past Medical History:  Diagnosis Date  . Anemia   . Anxiety   . Bell's palsy   . Depression   . DVT (deep venous thrombosis) (HCC) 09/02/2012   [place] on 02/13/2012 showed acute DVT in left calf veins and posterior tibial veins  . Fibromyalgia   . GERD (gastroesophageal reflux disease)   . History of blood transfusion   . HTN (hypertension)   . Leukopenia   . MS (multiple sclerosis) (HCC) 1997  . Peripheral edema   . Port catheter in place 12/31/2012    Past Surgical History:  Procedure Laterality Date  . ABDOMINAL HYSTERECTOMY    . CHOLECYSTECTOMY    . ESOPHAGOGASTRODUODENOSCOPY  2011   Dr. Jena Gauss. Possible cervical esophageal with status post disruption with passage of Maloney dilator, glycol esophageal erythema, antral erosions. Biopsy from the stomach revealed minimal chronic inactive inflammation but negative for H. pylori  . LAPAROSCOPIC GASTRIC SLEEVE RESECTION N/A 04/17/2016   Procedure: LAPAROSCOPIC GASTRIC SLEEVE RESECTION WITH UPPER ENDOSCOPY;  Surgeon: Glenna Fellows, MD;  Location: [facility];  Service: General;  Laterality: N/A;  . TUBAL LIGATION   2001    There were no vitals filed for this visit.      Subjective Assessment - 09/06/16 1702    Subjective Patient arrives stating she is doing well, no major changes since evaluation    Pertinent History history of DVT, MS, history of Bell's palsy, fibromyalgia, HTN    Patient Stated Goals improve walking and balance, strengthening, reduce pain    Currently in Pain? Yes   Pain Score 9    Pain Location Other (Comment)  R side in general             [facility] PT Assessment - 09/06/16 0001      AROM   Right Ankle Dorsiflexion 4   Left Ankle Dorsiflexion 10                     [facility] Adult PT Treatment/Exercise - 09/06/16 0001      Knee/Hip Exercises: Aerobic   Nustep Nustep level 0 x8 minutes   not included in billing      Knee/Hip Exercises: Standing   Heel Raises Both;1 set;10 reps   Heel Raises Limitations heel and toe    Rocker Board 2 minutes   Rocker Board Limitations B HHA, longer holds when board to the R    Other Standing Knee Exercises stepping over hurdles with B HHA assist for correct form 4x3 with 4 inch hurdles  with and without 1/2# R LE  Knee/Hip Exercises: Supine   Bridges Both;1 set;10 reps   Bridges Limitations UEs on table    Straight Leg Raises Both;1 set;10 reps     Knee/Hip Exercises: Sidelying   Hip ABduction Both;1 set;10 reps   Hip ABduction Limitations cues for form/reduction of compensations      Knee/Hip Exercises: Prone   Hip Extension Both;1 set;10 reps   Hip Extension Limitations cues for form/reduction of compensations                 PT Education - 09/06/16 1741    Education provided Yes   Education Details HEP, benefits of light weight for proprioception, intial eval/goals    Person(s) Educated Patient   Methods Explanation   Comprehension Verbalized understanding          PT Short Term Goals - 09/04/16 1742      PT SHORT TERM GOAL #1   Title Patient to be able to identify 5/5 safety factors  in order to reduce fall risk and improve general safety    Time 4   Period Weeks   Status New     PT SHORT TERM GOAL #2   Title Patient to demonstrate improved gait mechancis, including increased ankle DF B, improved posture, improved foot clearnace B, and gait speed at least 1.35m/s in order to enhance general mobility with reduced fall risk    Time 4   Period Weeks   Status New     PT SHORT TERM GOAL #3   Title Patient to be able to ambulate 713ft during in order to improve community access and reduce fall risk    Time 4   Period Weeks   Status New     PT SHORT TERM GOAL #4   Title Patient to be consistently performing regular walking program at least 5 days per week and 20 minutes in order to improve general health stauts and functional activity tolerance    Time 4   Period Weeks   Status New           PT Long Term Goals - 09/04/16 1744      PT LONG TERM GOAL #1   Title Patient to demonstrate functional strength 4+/5 in all tested groups in order to reduce pain and improve general mobiltiy    Time 8   Period Weeks   Status New     PT LONG TERM GOAL #2   Title Patient to be able to tolerate at least 15 minutes of functional activity without fatigue in order to improve general mobility and safety    Time 8   Period Weeks   Status New     PT LONG TERM GOAL #3   Title Patient to score at least 18 on DGI in order to show improved mobility and reduced fall risk    Time 8   Period Weeks   Status New     PT LONG TERM GOAL #4   Title Patient to experience pain R LE no more than 3/10 in order to improve QOL and functional activity tolerance    Time 8   Period Weeks   Status New     PT LONG TERM GOAL #5   Title Patient to be independent in correctly and consistently performing advanced HEP in addtion to regular walking program in order to improve general health status, maintain functional gains, and assist in managing symptoms of progressive disorder    Time 8    Period Weeks  Status New               Plan - 09/06/16 1742    Clinical Impression Statement Began session on Nustep for facilitation of functional activity tolerance, increased used R LE, and carry over to gait mechanics; not included in billing. Otherwise worked on functional strengthening in [employer] and [employer] positions as well as balance. Noted difficulty with navigating hurdles today, however which improved with application of  a half pound weight for proprioception; gait mechanics also appear to somewhat improved with weight for proprioception as well. Also noted reduced active ankle DF R LE today, will continue to address moving forward. Reviewed initial eval after completing quick disclosure. Assigned HEP focused on strength and balance as well this session.    Rehab Potential Good   Clinical Impairments Affecting Rehab Potential (+) appears motivated to participate in skilled PT services, relatively low level of MS related impairments; (-) obesity, sedentary lifestyle, progressive neuromuscular disorder   PT Frequency 2x / week   PT Duration 8 weeks   PT Treatment/Interventions ADLs/Self Care Home Management;DME Instruction;Gait training;Stair training;Functional mobility training;Therapeutic activities;Therapeutic exercise;Balance training;Neuromuscular re-education;Patient/family education;Manual techniques;Energy conservation   PT Next Visit Plan gastroc/soleus stretch, continue using 1/2 pound weight for proprioception R LE during session. Progress Nustep and functional strength, introduce balance. Discuss MS society.    PT Home Exercise Plan 3/29: bridges, standing ankle DF, standing marches    Consulted and Agree with Plan of Care Patient      Patient will benefit from skilled therapeutic intervention in order to improve the following deficits and impairments:  Abnormal gait, Pain, Decreased coordination, Decreased mobility, Decreased activity tolerance, Decreased strength,  Decreased balance, Difficulty walking, Decreased safety awareness  Visit Diagnosis: Muscle weakness (generalized)  Unsteadiness on feet  Difficulty in walking, not elsewhere classified  Other symptoms and signs involving the musculoskeletal system     Problem List Patient Active Problem List   Diagnosis Date Noted  . Primary osteoarthritis of right knee 08/30/2016  . Primary insomnia 08/30/2016  . Myofascial pain 08/30/2016  . Sciatica, right side 12/12/2015  . Right knee pain 12/12/2015  . Morbid obesity (HCC) 08/12/2015  . Lumbago 02/10/2014  . Chronic anxiety 07/08/2013  . Swelling of breast 04/08/2013  . Chronic pain syndrome 03/11/2013  . Port catheter in place 12/31/2012  . Unspecified constipation 10/01/2012  . Anemia 10/01/2012  . Rectal bleeding 10/01/2012  . Elevated alkaline phosphatase level 10/01/2012  . MS (multiple sclerosis) (HCC) 09/02/2012  . DVT (deep venous thrombosis) (HCC) 09/02/2012  . ANEMIA 04/12/2010  . NAUSEA WITH VOMITING 04/12/2010  . OTHER DYSPHAGIA 04/12/2010    Nedra Hai PT, DPT [phone]  [facility] [address] [place], [place], [address] Phone: [phone]   Fax:  [phone]  Name: Melinda Moore MRN: [id number] Date of Birth: 12/05/1970

## 2016-09-06 NOTE — Patient Instructions (Signed)
   TOES RAISES - DORSIFLEXION STANDING  In a standing position with your feet on the ground, raise up your forefoot and toes as you bend at your ankle.   Repeat 10 times, twice a day.      BRIDGING  While lying on your back, tighten your lower abdominals, squeeze your buttocks and then raise your buttocks off the floor/bed as creating a "Bridge" with your body.   Repeat 10 times, twice a day.     Standing marching in place-Supported  Stand facing your kitchen countertop. Place 1 or 2 hands on the counter for support. Begin marching in place, bringing your left knee up then down, followed by your right knee up and down.   Repeat 1x10 each side, twice a day.

## 2016-09-08 DIAGNOSIS — Z8659 Personal history of other mental and behavioral disorders: Secondary | ICD-10-CM | POA: Insufficient documentation

## 2016-09-10 ENCOUNTER — Telehealth (HOSPITAL_COMMUNITY): Payer: Self-pay | Admitting: Family Medicine

## 2016-09-10 ENCOUNTER — Telehealth: Payer: Self-pay | Admitting: Family Medicine

## 2016-09-10 NOTE — Telephone Encounter (Signed)
Prior-Authorization Completed for Medication. 72 hr waiting period for approval/denial per Cover My Meds.

## 2016-09-10 NOTE — Telephone Encounter (Signed)
09/10/16 pt called to cx Wed appt but no reason was given

## 2016-09-11 ENCOUNTER — Telehealth: Payer: Self-pay

## 2016-09-11 ENCOUNTER — Encounter: Payer: Self-pay | Admitting: Rheumatology

## 2016-09-11 ENCOUNTER — Telehealth: Payer: Self-pay | Admitting: Family Medicine

## 2016-09-11 ENCOUNTER — Ambulatory Visit (INDEPENDENT_AMBULATORY_CARE_PROVIDER_SITE_OTHER): Payer: Medicare Other | Admitting: Rheumatology

## 2016-09-11 VITALS — BP 110/76 | HR 94 | Resp 18 | Ht 67.0 in | Wt 240.0 lb

## 2016-09-11 DIAGNOSIS — M791 Myalgia: Secondary | ICD-10-CM | POA: Diagnosis not present

## 2016-09-11 DIAGNOSIS — M1711 Unilateral primary osteoarthritis, right knee: Secondary | ICD-10-CM

## 2016-09-11 DIAGNOSIS — F5101 Primary insomnia: Secondary | ICD-10-CM | POA: Diagnosis not present

## 2016-09-11 DIAGNOSIS — G35 Multiple sclerosis: Secondary | ICD-10-CM

## 2016-09-11 DIAGNOSIS — Z86718 Personal history of other venous thrombosis and embolism: Secondary | ICD-10-CM

## 2016-09-11 DIAGNOSIS — R748 Abnormal levels of other serum enzymes: Secondary | ICD-10-CM | POA: Diagnosis not present

## 2016-09-11 DIAGNOSIS — Z8669 Personal history of other diseases of the nervous system and sense organs: Secondary | ICD-10-CM | POA: Diagnosis not present

## 2016-09-11 DIAGNOSIS — Z8659 Personal history of other mental and behavioral disorders: Secondary | ICD-10-CM | POA: Diagnosis not present

## 2016-09-11 DIAGNOSIS — M7918 Myalgia, other site: Secondary | ICD-10-CM

## 2016-09-11 MED ORDER — TRIAMCINOLONE ACETONIDE 40 MG/ML IJ SUSP
40.0000 mg | INTRAMUSCULAR | Status: AC | PRN
  Administered 2016-09-11: 40 mg via INTRA_ARTICULAR

## 2016-09-11 MED ORDER — LIDOCAINE HCL 1 % IJ SOLN
1.5000 mL | INTRAMUSCULAR | Status: AC | PRN
  Administered 2016-09-11: 1.5 mL

## 2016-09-11 MED ORDER — DICLOFENAC SODIUM 1 % TD GEL: 2.0000 g | Freq: Four times a day (QID) | TRANSDERMAL | 2 refills | Status: DC

## 2016-09-11 NOTE — Telephone Encounter (Signed)
Marchelle Folks with [facility] called and stated that patient called in refills on Tegretol XR on yesterday. Patient has not had this medication filled since November. Pharmacy was wondering if you were ok with the patient having medication filled due to not taking in such a long period of time and also not having the labs drawn for the particular medication. Patient told pharmacist that the medication was for her Bell's Palsy. Also pharmacist states that she has a different brand and wants to know if you are ok with switching to the other brand. Please advise?

## 2016-09-11 NOTE — Telephone Encounter (Signed)
Received a conformation form cover my meds regarding a prior authorization approval for generic voltaren gel through 06/10/2017.   Reference number: [id number] Phone number: [phone]  Spoke with patient who voiced understanding and denies any questions regarding her medication at this time.   Carollynn Pennywell, Prentiss, CPhT   4:20 PM

## 2016-09-12 ENCOUNTER — Telehealth (HOSPITAL_COMMUNITY): Payer: Self-pay | Admitting: Family Medicine

## 2016-09-12 ENCOUNTER — Ambulatory Visit (HOSPITAL_COMMUNITY): Payer: Commercial Managed Care - HMO | Admitting: Physical Therapy

## 2016-09-12 NOTE — Telephone Encounter (Signed)
Called and spoke with Marchelle Folks at [facility] and informed her per Dr.Scott Luking- Patient has appointment on Friday ant they will discuss Tegretol at that appointment. Patient verbalized understanding.

## 2016-09-12 NOTE — Telephone Encounter (Signed)
09/12/16 pt cx and said she would see [place] again on Tuesday.  She didn't give a reason for cx

## 2016-09-12 NOTE — Telephone Encounter (Signed)
The patient has an office visit on Friday we will discuss this with her at that time-please notify pharmacy thank you

## 2016-09-13 ENCOUNTER — Telehealth: Payer: Self-pay | Admitting: Family Medicine

## 2016-09-13 ENCOUNTER — Ambulatory Visit: Payer: Medicare Other | Admitting: Family Medicine

## 2016-09-13 ENCOUNTER — Ambulatory Visit (HOSPITAL_COMMUNITY): Payer: Commercial Managed Care - HMO

## 2016-09-13 MED ORDER — METAXALONE 800 MG PO TABS
ORAL_TABLET | ORAL | 2 refills | Status: DC
Start: 2016-09-13 — End: 2018-07-08

## 2016-09-13 NOTE — Telephone Encounter (Signed)
Received a fax from patient's insurance stating that her Medicare Part D program denied her Skelaxin due to indication for her Multiple Sclerosis. Please advise?

## 2016-09-13 NOTE — Telephone Encounter (Signed)
Please resubmit the prescription with indication of muscle spasms musculoskeletal pain(which she gets as a result of her MS) thank you

## 2016-09-13 NOTE — Telephone Encounter (Signed)
Medication resent to pharmacy per Dr.Scott Luking with new indication.

## 2016-09-13 NOTE — Addendum Note (Signed)
Addended by: Jeralene Peters on: 09/13/2016 09:55 AM   Modules accepted: Orders

## 2016-09-14 ENCOUNTER — Ambulatory Visit (HOSPITAL_COMMUNITY)
Admission: RE | Admit: 2016-09-14 | Discharge: 2016-09-14 | Disposition: A | Discharge status: Home / Self Care | Payer: Commercial Managed Care - HMO | Source: Ambulatory Visit | Attending: Family Medicine | Admitting: Family Medicine

## 2016-09-14 ENCOUNTER — Encounter: Payer: Self-pay | Admitting: Family Medicine

## 2016-09-14 ENCOUNTER — Ambulatory Visit (INDEPENDENT_AMBULATORY_CARE_PROVIDER_SITE_OTHER): Payer: Medicare Other | Admitting: Family Medicine

## 2016-09-14 ENCOUNTER — Other Ambulatory Visit (HOSPITAL_COMMUNITY)
Admission: RE | Admit: 2016-09-14 | Discharge: 2016-09-14 | Disposition: A | Discharge status: Home / Self Care | Payer: Commercial Managed Care - HMO | Source: Ambulatory Visit | Attending: Family Medicine | Admitting: Family Medicine

## 2016-09-14 VITALS — BP 132/88 | Ht 67.0 in | Wt 233.8 lb

## 2016-09-14 DIAGNOSIS — M79661 Pain in right lower leg: Secondary | ICD-10-CM | POA: Insufficient documentation

## 2016-09-14 DIAGNOSIS — M7918 Myalgia, other site: Secondary | ICD-10-CM

## 2016-09-14 DIAGNOSIS — G894 Chronic pain syndrome: Secondary | ICD-10-CM | POA: Diagnosis not present

## 2016-09-14 DIAGNOSIS — G35 Multiple sclerosis: Secondary | ICD-10-CM | POA: Diagnosis not present

## 2016-09-14 DIAGNOSIS — M79604 Pain in right leg: Secondary | ICD-10-CM | POA: Diagnosis not present

## 2016-09-14 DIAGNOSIS — M791 Myalgia: Secondary | ICD-10-CM | POA: Diagnosis not present

## 2016-09-14 LAB — BASIC METABOLIC PANEL
ANION GAP: 10 (ref 5–15)
BUN: 14 mg/dL (ref 6–20)
CALCIUM: 9.2 mg/dL (ref 8.9–10.3)
CO2: 25 mmol/L (ref 22–32)
Chloride: 103 mmol/L (ref 101–111)
Creatinine, Ser: 0.9 mg/dL (ref 0.44–1.00)
GLUCOSE: 105 mg/dL — AB (ref 65–99)
Potassium: 4 mmol/L (ref 3.5–5.1)
SODIUM: 138 mmol/L (ref 135–145)

## 2016-09-14 LAB — D-DIMER, QUANTITATIVE (NOT AT ARMC)

## 2016-09-14 MED ORDER — OXYCODONE-ACETAMINOPHEN 7.5-325 MG PO TABS: 1.0000 | ORAL_TABLET | Freq: Three times a day (TID) | ORAL | 0 refills | Status: DC | PRN

## 2016-09-14 MED ORDER — METHYLPHENIDATE HCL 10 MG PO TABS: ORAL_TABLET | ORAL | 0 refills | Status: DC

## 2016-09-14 NOTE — Progress Notes (Signed)
   Subjective:    Patient ID: Melinda Moore, female    DOB: 03/23/1971, 46 y.o.   MRN: [id number]  HPI This patient was seen today for chronic pain. Takes for tight leg pain from knee down  The medication list was reviewed and updated.   -Compliance with medication: yes  - Number patient states they take daily: takes 3  A day. Pt wants to decrease med   -when was the last dose patient took? yes  The patient was advised the importance of maintaining medication and not using illegal substances with these.  Refills needed: yes  The patient was educated that we can provide 3 monthly scripts for their medication, it is their responsibility to follow the instructions.  Side effects or complications from medications: none  Patient is aware that pain medications are meant to minimize the severity of the pain to allow their pain levels to improve to allow for better function. They are aware of that pain medications cannot totally remove their pain.  Due for UDT ( at least once per year) : last one July 2017.   Long discussion held with patient regarding her MS how it is going also with chronic pain she has with the lower back and into the leg as well as fibromyalgia as well as the pain from the MS and myofascial pain she would like to reduce her oxycodone which I fully support we will keep trying to gradually reduce it every month as tolerated she has a history DVT and is having right Pain and discomfort since surgery this needs further looking into      Review of Systems  Constitutional: Negative for activity change and appetite change.  Gastrointestinal: Negative for abdominal pain and vomiting.  Neurological: Negative for weakness.  Psychiatric/Behavioral: Negative for confusion.       Objective:   Physical Exam  Constitutional: She appears well-nourished. No distress.  HENT:  Head: Normocephalic.  Cardiovascular: Normal rate, regular rhythm and normal heart sounds.   No murmur  heard. Pulmonary/Chest: Effort normal and breath sounds normal.  Musculoskeletal: She exhibits no edema.  Lymphadenopathy:    She has no cervical adenopathy.  Neurological: She is alert.  Psychiatric: Her behavior is normal.  Vitals reviewed.         Assessment & Plan:  Her MS is monitored by the neurologist seems to be doing okay Depression doing well with the Effexor denies any worsening Neurontin does help with the MS-related pain and with her back pain She's had some right calf pain since having gastric surgery and she has a history of DVT we will do a stat ultrasound She has intermittent myofascial pain of the face related to Bell's palsy and the patient only uses this occasionally maybe once or twice a month therefore Tegretol levels are not necessary we did give a refill Patient was encouraged dietary in the continue to lose weight Pain prescriptions given she would like to reduce the dose to 7.53 times a day she will let us know how this is going we might possibly will reduce it further She uses ADD medicines to help her with the MS and help her with fatigue is helped her greatly 3 prescriptions given  25 minutes was spent with the patient. Greater than half the time was spent in discussion and answering questions and counseling regarding the issues that the patient came in for today.

## 2016-09-17 ENCOUNTER — Telehealth (HOSPITAL_COMMUNITY): Payer: Self-pay | Admitting: Family Medicine

## 2016-09-17 NOTE — Telephone Encounter (Signed)
09/17/16 I spoke to patient and she wanted to cx the 4/10 appt.  She said that she was going to the orthopedic dr for a back injection and she would need to cx the appt.  She also said for me to let Baxter Hire know as well.

## 2016-09-18 ENCOUNTER — Ambulatory Visit (INDEPENDENT_AMBULATORY_CARE_PROVIDER_SITE_OTHER): Payer: Medicare Other | Admitting: Physical Medicine and Rehabilitation

## 2016-09-18 ENCOUNTER — Ambulatory Visit (HOSPITAL_COMMUNITY): Payer: Commercial Managed Care - HMO | Admitting: Physical Therapy

## 2016-09-18 ENCOUNTER — Encounter (INDEPENDENT_AMBULATORY_CARE_PROVIDER_SITE_OTHER): Payer: Self-pay | Admitting: Physical Medicine and Rehabilitation

## 2016-09-18 ENCOUNTER — Other Ambulatory Visit: Payer: Self-pay | Admitting: Family Medicine

## 2016-09-18 VITALS — BP 124/90 | HR 100

## 2016-09-18 DIAGNOSIS — M47816 Spondylosis without myelopathy or radiculopathy, lumbar region: Secondary | ICD-10-CM

## 2016-09-18 DIAGNOSIS — G8929 Other chronic pain: Secondary | ICD-10-CM | POA: Diagnosis not present

## 2016-09-18 DIAGNOSIS — M5442 Lumbago with sciatica, left side: Secondary | ICD-10-CM

## 2016-09-18 DIAGNOSIS — M5116 Intervertebral disc disorders with radiculopathy, lumbar region: Secondary | ICD-10-CM | POA: Diagnosis not present

## 2016-09-18 DIAGNOSIS — M5416 Radiculopathy, lumbar region: Secondary | ICD-10-CM | POA: Diagnosis not present

## 2016-09-18 NOTE — Progress Notes (Signed)
Melinda Moore - 46 y.o. female MRN [id number]  Date of birth: 05-Feb-1971  Office Visit Note: Visit Date: 09/18/2016 PCP: Lilyan Punt, MD Referred by: Babs Sciara, MD  Subjective: Chief Complaint  Patient presents with  . Lower Back - Pain  . Left Hip - Pain   HPI: [name] is a very pleasant 46 year old female that we last saw 3 years ago who has a complicated chronic pain history and chronic low back pain history complicated by multiple sclerosis as well as depression and anxiety and fibromyalgia. She's been followed by her primary care physician Dr. Gerda Diss whom she suggested told her that the injection may help her back and since she called to get an appointment with [place]. She has failed conservative care including medication management and time this is been going on for some time. She reports pain across the lower back and into the right leg really from the knee down to the foot. It seems to skip the thigh. She has no numbness or tingling. The leg pain is more posterior lateral in the back of the calf. She rates her pain as a 10 out of 10 when it's really severe. She has had an MRI of the lumbar spine that was completed in 2015. This is reviewed below. She did have a disc herniation at L5-S1 and she did have multilevel facet arthropathy but without central stenosis. She has had no bowel or bladder difficulties. She's had no recent multiple sclerosis flareups. She's had no focal weakness or trauma.    Review of Systems  Constitutional: Negative for chills, fever, malaise/fatigue and weight loss.  HENT: Negative for hearing loss and sinus pain.   Eyes: Negative for blurred vision, double vision and photophobia.  Respiratory: Negative for cough and shortness of breath.   Cardiovascular: Negative for chest pain, palpitations and leg swelling.  Gastrointestinal: Negative for abdominal pain, nausea and vomiting.  Genitourinary: Negative for flank pain.  Musculoskeletal: Positive for back  pain. Negative for myalgias.       Leg pain  Skin: Negative for itching and rash.  Neurological: Negative for tremors, focal weakness and weakness.  Endo/Heme/Allergies: Negative.   Psychiatric/Behavioral: Negative for depression.  All other systems reviewed and are negative.  Otherwise per HPI.  Assessment & Plan: Visit Diagnoses:  1. Lumbar radiculopathy   2. Radiculopathy due to lumbar intervertebral disc disorder   3. Spondylosis without myelopathy or radiculopathy, lumbar region   4. Chronic bilateral low back pain with left-sided sciatica     Plan: Findings:  Chronic pain syndrome and chronic history of low back problems with MRI evidence of pretty significant degenerative arthritis and facet arthropathy and disc herniation at L5-S1. The disc herniation was more left paracentral on MRI 2015 which still can get someone right-sided symptoms when it is more of a paracentral protrusion. At this point I think her pain is multifactorial it could be facet mediated pain with referral but she is getting pain past the knee which we tended think being more of a nerve root issue. She's not really getting numbness or tingling or paresthesia or blood does have a funny feeling. Her symptoms are all on the right side and on the left side. She is complicated with her fibromyalgia and that tends to exacerbate or amplify the pain that is coming from these other sources. But I do think this is a radicular pain makes with facet mediated mechanical back pain. At this point I do think she would do  well with a diagnostic and hopefully therapeutic right L5-S1 intralaminar epidural steroid injection. Depending on the relief she gets with that I would look at either a facet joint block or transforaminal approach at L5. I do not think she needs a new MRI at this point although that is something that could be considered. If we get her symptoms calmed down and she may benefit from a short course of skilled physical  therapy. I would not change any medications at this point and I may just make recommendations to her primary care physician since she knows her medications better than I do. I spent more than 30 minutes speaking face-to-face with the patient with 50% of the time in counseling.    Meds & Orders: No orders of the defined types were placed in this encounter.  No orders of the defined types were placed in this encounter.   Follow-up: Return for Right L5-S1 interlaminar epidural steroid injection..   Procedures: No procedures performed  No notes on file   Clinical History: Lumbar spine MRI dated 10/10/2013 1. Abnormal areas of T2 hyperintensity in the lower thoracic cord and conus, compatible with demyelination and known history of multiple sclerosis.  2. Multilevel degenerative changes in the lumbar spine with a central to left paracentral disc herniation at L5-S1 resulting in slight asymmetry of the S1 nerve roots. Varying degrees of neural foraminal narrowing, most pronounced at L5-S1. 3. Facet arthropathy at L3-4 but without stenosis. Facet arthropathy at L4-5 and L5-S1 with some right foraminal narrowing at L5-S1.  She reports that she has never smoked. She has never used smokeless tobacco. No results for input(s): HGBA1C, LABURIC in the last 8760 hours.  Objective:  VS:  HT:    WT:   BMI:     BP:124/90  HR:100bpm  TEMP: ( )  RESP:  Physical Exam  Constitutional: She is oriented to person, place, and time. She appears well-developed and well-nourished. No distress.  HENT:  Head: Normocephalic and atraumatic.  Nose: Nose normal.  Mouth/Throat: Oropharynx is clear and moist.  Eyes: Conjunctivae are normal. Pupils are equal, round, and reactive to light.  Neck: Normal range of motion. Neck supple.  Cardiovascular: Regular rhythm and intact distal pulses.   Pulmonary/Chest: Effort normal and breath sounds normal.  Abdominal: She exhibits no distension.  Musculoskeletal:  The patient  is slow to rise from a seated position does have some pain that is concordant low back pain with extension rotation of the lumbar spine. She is tender really throughout the musculature and even the scan across the lower back and greater trochanters. She's not exquisitely tender over the greater trochanter on the left or right. She has no pain with hip rotation. She has good distal strength.  Neurological: She is alert and oriented to person, place, and time. No cranial nerve deficit. She exhibits normal muscle tone. Coordination normal.  Skin: Skin is warm. No rash noted. No erythema.  Psychiatric: She has a normal mood and affect. Her behavior is normal.  Nursing note and vitals reviewed.   Ortho Exam Imaging: No results found.  Past Medical/Family/Surgical/Social History: Medications & Allergies reviewed per EMR Patient Active Problem List   Diagnosis Date Noted  . Radiculopathy due to lumbar intervertebral disc disorder 09/21/2016  . Spondylosis without myelopathy or radiculopathy, lumbar region 09/21/2016  . History of anxiety 09/08/2016  . Primary osteoarthritis of right knee 08/30/2016  . Primary insomnia 08/30/2016  . Myofascial pain 08/30/2016  . Sciatica, right side 12/12/2015  .  Right knee pain 12/12/2015  . Morbid obesity (HCC) 08/12/2015  . Lumbago 02/10/2014  . Chronic anxiety 07/08/2013  . Swelling of breast 04/08/2013  . Chronic pain syndrome 03/11/2013  . Port catheter in place 12/31/2012  . Unspecified constipation 10/01/2012  . Anemia 10/01/2012  . Rectal bleeding 10/01/2012  . Elevated alkaline phosphatase level 10/01/2012  . MS (multiple sclerosis) (HCC) 09/02/2012  . DVT (deep venous thrombosis) (HCC) 09/02/2012  . ANEMIA 04/12/2010  . NAUSEA WITH VOMITING 04/12/2010  . OTHER DYSPHAGIA 04/12/2010   Past Medical History:  Diagnosis Date  . Anemia   . Anxiety   . Bell's palsy   . Depression   . DVT (deep venous thrombosis) (HCC) 09/02/2012   [place] on  02/13/2012 showed acute DVT in left calf veins and posterior tibial veins  . Fibromyalgia   . GERD (gastroesophageal reflux disease)   . History of blood transfusion   . HTN (hypertension)   . Leukopenia   . MS (multiple sclerosis) (HCC) 1997  . Peripheral edema   . Port catheter in place 12/31/2012   Family History  Problem Relation Age of Onset  . Heart disease Father   . Hyperlipidemia Father   . Hypertension Sister   . Other Paternal Uncle     Possible colon cancer, age greater than 43  . Cirrhosis Brother     etoh   Past Surgical History:  Procedure Laterality Date  . ABDOMINAL HYSTERECTOMY    . CHOLECYSTECTOMY    . ESOPHAGOGASTRODUODENOSCOPY  2011   Dr. Jena Gauss. Possible cervical esophageal with status post disruption with passage of Maloney dilator, glycol esophageal erythema, antral erosions. Biopsy from the stomach revealed minimal chronic inactive inflammation but negative for H. pylori  . LAPAROSCOPIC GASTRIC SLEEVE RESECTION N/A 04/17/2016   Procedure: LAPAROSCOPIC GASTRIC SLEEVE RESECTION WITH UPPER ENDOSCOPY;  Surgeon: Glenna Fellows, MD;  Location: [facility];  Service: General;  Laterality: N/A;  . TUBAL LIGATION  2001   Social History   Occupational History  . Not on file.   Social History Main Topics  . Smoking status: Never Smoker  . Smokeless tobacco: Never Used  . Alcohol use No  . Drug use: No  . Sexual activity: Not on file

## 2016-09-19 ENCOUNTER — Telehealth (HOSPITAL_COMMUNITY): Payer: Self-pay | Admitting: Family Medicine

## 2016-09-19 NOTE — Telephone Encounter (Signed)
09/19/16 pt called and said that yesterday she went to [facility] and she wanted [place] to cancel appointments and she would let us know when she would be back.

## 2016-09-21 ENCOUNTER — Encounter (INDEPENDENT_AMBULATORY_CARE_PROVIDER_SITE_OTHER): Payer: Self-pay | Admitting: Physical Medicine and Rehabilitation

## 2016-09-21 DIAGNOSIS — M5116 Intervertebral disc disorders with radiculopathy, lumbar region: Secondary | ICD-10-CM | POA: Insufficient documentation

## 2016-09-21 DIAGNOSIS — M47816 Spondylosis without myelopathy or radiculopathy, lumbar region: Secondary | ICD-10-CM | POA: Insufficient documentation

## 2016-09-24 ENCOUNTER — Ambulatory Visit (INDEPENDENT_AMBULATORY_CARE_PROVIDER_SITE_OTHER): Payer: Medicare Other

## 2016-09-24 ENCOUNTER — Ambulatory Visit (INDEPENDENT_AMBULATORY_CARE_PROVIDER_SITE_OTHER): Payer: Medicare Other | Admitting: Physical Medicine and Rehabilitation

## 2016-09-24 ENCOUNTER — Encounter (INDEPENDENT_AMBULATORY_CARE_PROVIDER_SITE_OTHER): Payer: Self-pay | Admitting: Physical Medicine and Rehabilitation

## 2016-09-24 VITALS — BP 131/79 | HR 92

## 2016-09-24 DIAGNOSIS — M5416 Radiculopathy, lumbar region: Secondary | ICD-10-CM | POA: Diagnosis not present

## 2016-09-24 MED ORDER — METHYLPREDNISOLONE ACETATE 80 MG/ML IJ SUSP
80.0000 mg | Freq: Once | INTRAMUSCULAR | Status: AC
  Administered 2016-09-24: 80 mg

## 2016-09-24 MED ORDER — LIDOCAINE HCL (PF) 1 % IJ SOLN
0.3300 mL | Freq: Once | INTRAMUSCULAR | Status: AC
  Administered 2016-09-24: 0.3 mL

## 2016-09-24 NOTE — Procedures (Signed)
Lumbar Epidural Steroid Injection - Interlaminar Approach with Fluoroscopic Guidance  Patient: Melinda Moore      Date of Birth: 07/26/1970 MRN: [id number] PCP: Lilyan Punt, MD      Visit Date: 09/24/2016   Universal Protocol:    Date/Time: 09/25/1810:28 PM  Consent Given By: the patient  Position: PRONE  Additional Comments: Vital signs were monitored before and after the procedure. Patient was prepped and draped in the usual sterile fashion. The correct patient, procedure, and site was verified.   Injection Procedure Details:  Procedure Site One Meds Administered:  Meds ordered this encounter  Medications  . lidocaine (PF) (XYLOCAINE) 1 % injection 0.3 mL  . methylPREDNISolone acetate (DEPO-MEDROL) injection 80 mg     Laterality: Right  Location/Site:  L5-S1  Needle size: 20 G  Needle type: Tuohy  Needle Placement: Paramedian epidural  Findings:  -Contrast Used: 1 mL iohexol 180 mg iodine/mL   -Comments: Excellent flow of contrast into the epidural space.  Procedure Details: Using a paramedian approach from the side mentioned above, the region overlying the inferior lamina was localized under fluoroscopic visualization and the soft tissues overlying this structure were infiltrated with 4 ml. of 1% Lidocaine without Epinephrine. The Tuohy needle was inserted into the epidural space using a paramedian approach.   The epidural space was localized using loss of resistance along with lateral and bi-planar fluoroscopic views.  After negative aspirate for air, blood, and CSF, a 2 ml. volume of Isovue-250 was injected into the epidural space and the flow of contrast was observed. Radiographs were obtained for documentation purposes.    The injectate was administered into the level noted above.   Additional Comments:  The patient tolerated the procedure well Dressing: Band-Aid    Post-procedure details: Patient was observed during the procedure. Post-procedure  instructions were reviewed.  Patient left the clinic in stable condition.

## 2016-09-24 NOTE — Patient Instructions (Signed)

## 2016-09-24 NOTE — Progress Notes (Signed)
Melinda Moore - 46 y.o. female MRN [id number]  Date of birth: 01-21-1971  Office Visit Note: Visit Date: 09/24/2016 PCP: Lilyan Punt, MD Referred by: Babs Sciara, MD  Subjective: Chief Complaint  Patient presents with  . Lower Back - Pain   HPI: Melinda Moore is a 46 year old female here today for planned right L5-S1 interlaminar epidural steroid injection with fluoroscopic guidance. No change in symptoms. Please see our prior evaluation and management note for further details and justification.    ROS Otherwise per HPI.  Assessment & Plan: Visit Diagnoses:  1. Lumbar radiculopathy     Plan: Findings:  Right L5-S1 interlaminar epidural steroid injection.    Meds & Orders:  Meds ordered this encounter  Medications  . lidocaine (PF) (XYLOCAINE) 1 % injection 0.3 mL  . methylPREDNISolone acetate (DEPO-MEDROL) injection 80 mg    Orders Placed This Encounter  Procedures  . XR C-ARM NO REPORT  . Epidural Steroid injection    Follow-up: Return if symptoms worsen or fail to improve.   Procedures: No procedures performed  Lumbar Epidural Steroid Injection - Interlaminar Approach with Fluoroscopic Guidance  Patient: Melinda Moore      Date of Birth: 06-26-1970 MRN: [id number] PCP: Lilyan Punt, MD      Visit Date: 09/24/2016   Universal Protocol:    Date/Time: 09/25/1810:28 PM  Consent Given By: the patient  Position: PRONE  Additional Comments: Vital signs were monitored before and after the procedure. Patient was prepped and draped in the usual sterile fashion. The correct patient, procedure, and site was verified.   Injection Procedure Details:  Procedure Site One Meds Administered:  Meds ordered this encounter  Medications  . lidocaine (PF) (XYLOCAINE) 1 % injection 0.3 mL  . methylPREDNISolone acetate (DEPO-MEDROL) injection 80 mg     Laterality: Right  Location/Site:  L5-S1  Needle size: 20 G  Needle type: Tuohy  Needle Placement: Paramedian  epidural  Findings:  -Contrast Used: 1 mL iohexol 180 mg iodine/mL   -Comments: Excellent flow of contrast into the epidural space.  Procedure Details: Using a paramedian approach from the side mentioned above, the region overlying the inferior lamina was localized under fluoroscopic visualization and the soft tissues overlying this structure were infiltrated with 4 ml. of 1% Lidocaine without Epinephrine. The Tuohy needle was inserted into the epidural space using a paramedian approach.   The epidural space was localized using loss of resistance along with lateral and bi-planar fluoroscopic views.  After negative aspirate for air, blood, and CSF, a 2 ml. volume of Isovue-250 was injected into the epidural space and the flow of contrast was observed. Radiographs were obtained for documentation purposes.    The injectate was administered into the level noted above.   Additional Comments:  The patient tolerated the procedure well Dressing: Band-Aid    Post-procedure details: Patient was observed during the procedure. Post-procedure instructions were reviewed.  Patient left the clinic in stable condition.    Clinical History: Lumbar spine MRI dated 10/10/2013 1. Abnormal areas of T2 hyperintensity in the lower thoracic cord and conus, compatible with demyelination and known history of multiple sclerosis.  2. Multilevel degenerative changes in the lumbar spine with a central to left paracentral disc herniation at L5-S1 resulting in slight asymmetry of the S1 nerve roots. Varying degrees of neural foraminal narrowing, most pronounced at L5-S1. 3. Facet arthropathy at L3-4 but without stenosis. Facet arthropathy at L4-5 and L5-S1 with some right foraminal narrowing at L5-S1.  She reports that she has never smoked. She has never used smokeless tobacco. No results for input(s): HGBA1C, LABURIC in the last 8760 hours.  Objective:  VS:  HT:    WT:   BMI:     BP:131/79  HR:92bpm  TEMP: ( )   RESP:100 % Physical Exam  Musculoskeletal:  Patient ambulates without aid with good distal strength.    Ortho Exam Imaging: Xr C-arm No Report  Result Date: 09/24/2016 Please see Notes or Procedures tab for imaging impression.   Past Medical/Family/Surgical/Social History: Medications & Allergies reviewed per EMR Patient Active Problem List   Diagnosis Date Noted  . Radiculopathy due to lumbar intervertebral disc disorder 09/21/2016  . Spondylosis without myelopathy or radiculopathy, lumbar region 09/21/2016  . History of anxiety 09/08/2016  . Primary osteoarthritis of right knee 08/30/2016  . Primary insomnia 08/30/2016  . Myofascial pain 08/30/2016  . Sciatica, right side 12/12/2015  . Right knee pain 12/12/2015  . Morbid obesity (HCC) 08/12/2015  . Lumbago 02/10/2014  . Chronic anxiety 07/08/2013  . Swelling of breast 04/08/2013  . Chronic pain syndrome 03/11/2013  . Port catheter in place 12/31/2012  . Unspecified constipation 10/01/2012  . Anemia 10/01/2012  . Rectal bleeding 10/01/2012  . Elevated alkaline phosphatase level 10/01/2012  . MS (multiple sclerosis) (HCC) 09/02/2012  . DVT (deep venous thrombosis) (HCC) 09/02/2012  . ANEMIA 04/12/2010  . NAUSEA WITH VOMITING 04/12/2010  . OTHER DYSPHAGIA 04/12/2010   Past Medical History:  Diagnosis Date  . Anemia   . Anxiety   . Bell's palsy   . Depression   . DVT (deep venous thrombosis) (HCC) 09/02/2012   [place] on 02/13/2012 showed acute DVT in left calf veins and posterior tibial veins  . Fibromyalgia   . GERD (gastroesophageal reflux disease)   . History of blood transfusion   . HTN (hypertension)   . Leukopenia   . MS (multiple sclerosis) (HCC) 1997  . Peripheral edema   . Port catheter in place 12/31/2012   Family History  Problem Relation Age of Onset  . Heart disease Father   . Hyperlipidemia Father   . Hypertension Sister   . Other Paternal Uncle     Possible colon cancer, age greater than 28  .  Cirrhosis Brother     etoh   Past Surgical History:  Procedure Laterality Date  . ABDOMINAL HYSTERECTOMY    . CHOLECYSTECTOMY    . ESOPHAGOGASTRODUODENOSCOPY  2011   Dr. Jena Gauss. Possible cervical esophageal with status post disruption with passage of Maloney dilator, glycol esophageal erythema, antral erosions. Biopsy from the stomach revealed minimal chronic inactive inflammation but negative for H. pylori  . LAPAROSCOPIC GASTRIC SLEEVE RESECTION N/A 04/17/2016   Procedure: LAPAROSCOPIC GASTRIC SLEEVE RESECTION WITH UPPER ENDOSCOPY;  Surgeon: Glenna Fellows, MD;  Location: [facility];  Service: General;  Laterality: N/A;  . TUBAL LIGATION  2001   Social History   Occupational History  . Not on file.   Social History Main Topics  . Smoking status: Never Smoker  . Smokeless tobacco: Never Used  . Alcohol use No  . Drug use: No  . Sexual activity: Not on file

## 2016-09-25 ENCOUNTER — Ambulatory Visit (HOSPITAL_COMMUNITY): Payer: Commercial Managed Care - HMO

## 2016-09-27 ENCOUNTER — Ambulatory Visit (HOSPITAL_COMMUNITY): Payer: Commercial Managed Care - HMO

## 2016-10-02 ENCOUNTER — Ambulatory Visit (HOSPITAL_COMMUNITY): Payer: Self-pay | Admitting: Physical Therapy

## 2016-10-03 ENCOUNTER — Ambulatory Visit: Payer: Self-pay | Admitting: Skilled Nursing Facility1

## 2016-10-04 ENCOUNTER — Ambulatory Visit (HOSPITAL_COMMUNITY): Payer: Self-pay

## 2016-10-05 ENCOUNTER — Telehealth (INDEPENDENT_AMBULATORY_CARE_PROVIDER_SITE_OTHER): Payer: Self-pay

## 2016-10-05 NOTE — Telephone Encounter (Signed)
Spoke with pt. Rt L5-S1 IL she had on 09/24/16 helped her back pain, 100% relief, but has not helped the pain down the leg at all. Wants to know if there is another injection she can have that will help with this?

## 2016-10-08 ENCOUNTER — Telehealth: Payer: Self-pay | Admitting: Family Medicine

## 2016-10-08 NOTE — Telephone Encounter (Signed)
lvm for pt to call back for scheduling.  °

## 2016-10-08 NOTE — Telephone Encounter (Signed)
Patient said that Dr. Lorin Picket changed her oxycodone to 7.5 mg and was told to call back and let him know how this was working.  She said it is working fine and would like refills for this.

## 2016-10-08 NOTE — Telephone Encounter (Signed)
Right L5 trans esi, then may need MRI if not better

## 2016-10-08 NOTE — Telephone Encounter (Signed)
Scheduled for 10/16/16 at 1430 with driver.

## 2016-10-09 ENCOUNTER — Ambulatory Visit (HOSPITAL_COMMUNITY): Payer: Self-pay | Admitting: Physical Therapy

## 2016-10-09 NOTE — Telephone Encounter (Signed)
Left message return call 10/09/2016 

## 2016-10-09 NOTE — Telephone Encounter (Signed)
Please discuss with the patient the following #1 when she was in the last time she was talking about trying to reduce her oxycodone. She was on 10 mg 3 times a day we reduced at 7.53 times a day area at this point she needs to decide whether or not to maintain at 7.5 milligram taken one 3 times a day or reducing it further to 5 mg taken one 3 times a day. Therefore I can either right 2 prescriptions for 7.5 mg 3 times a day and discuss reducing further at her next visit or we can reduce it down to 5 mg one 3 times a day at this point. Please have her clarify. The dates for her next 2 prescriptions would be 10/24/2016 and 11/23/2016 please document what she would like to do thanks

## 2016-10-10 NOTE — Telephone Encounter (Signed)
The patient states that she would like to stay on the 7.5 3 times a day.

## 2016-10-11 ENCOUNTER — Ambulatory Visit (HOSPITAL_COMMUNITY): Payer: Self-pay | Admitting: Physical Therapy

## 2016-10-11 MED ORDER — OXYCODONE-ACETAMINOPHEN 7.5-325 MG PO TABS: 1.0000 | ORAL_TABLET | Freq: Three times a day (TID) | ORAL | 0 refills | Status: DC | PRN

## 2016-10-11 NOTE — Telephone Encounter (Signed)
It would be fine to go ahead and do 2 additional prescriptions of 7.5, previous dictation stated out the dates for these, then the patient will need a chronic pain visit in approximately 2 months

## 2016-10-11 NOTE — Telephone Encounter (Signed)
Spoke with patient and informed her per Dr.Scott Luking- Prescriptions are ready for pick up. Will need office visit in 2 months. Patient verbalized understanding.

## 2016-10-16 ENCOUNTER — Ambulatory Visit (INDEPENDENT_AMBULATORY_CARE_PROVIDER_SITE_OTHER): Payer: Medicare Other | Admitting: Physical Medicine and Rehabilitation

## 2016-10-16 ENCOUNTER — Ambulatory Visit (INDEPENDENT_AMBULATORY_CARE_PROVIDER_SITE_OTHER): Payer: Medicare Other

## 2016-10-16 ENCOUNTER — Encounter (INDEPENDENT_AMBULATORY_CARE_PROVIDER_SITE_OTHER): Payer: Self-pay | Admitting: Physical Medicine and Rehabilitation

## 2016-10-16 ENCOUNTER — Ambulatory Visit (HOSPITAL_COMMUNITY): Payer: Self-pay | Admitting: Physical Therapy

## 2016-10-16 VITALS — BP 133/85 | HR 106

## 2016-10-16 DIAGNOSIS — M5116 Intervertebral disc disorders with radiculopathy, lumbar region: Secondary | ICD-10-CM | POA: Diagnosis not present

## 2016-10-16 DIAGNOSIS — M5416 Radiculopathy, lumbar region: Secondary | ICD-10-CM | POA: Diagnosis not present

## 2016-10-16 MED ORDER — METHYLPREDNISOLONE ACETATE 80 MG/ML IJ SUSP
80.0000 mg | Freq: Once | INTRAMUSCULAR | Status: AC
  Administered 2016-10-16: 80 mg

## 2016-10-16 MED ORDER — LIDOCAINE HCL (PF) 1 % IJ SOLN
2.0000 mL | Freq: Once | INTRAMUSCULAR | Status: AC
  Administered 2016-10-16: 2 mL

## 2016-10-16 MED ORDER — IIOPAMIDOL (ISOVUE-250) INJECTION 51%
3.0000 mL | Freq: Once | INTRAVENOUS | Status: AC
  Administered 2016-10-16: 3 mL
  Filled 2016-10-16: qty 50

## 2016-10-16 MED ORDER — BETAMETHASONE SOD PHOS & ACET 6 (3-3) MG/ML IJ SUSP: 12.0000 mg | Freq: Once | INTRAMUSCULAR | Status: DC

## 2016-10-16 NOTE — Progress Notes (Deleted)
Patient states she had great relief with the last injection with back pain but no relief with leg pain. Pain, numbness, and tingling down leg to foot.

## 2016-10-16 NOTE — Progress Notes (Signed)
Melinda Moore - 46 y.o. female MRN [id number]  Date of birth: 1970/09/08  Office Visit Note: Visit Date: 10/16/2016 PCP: Babs Sciara, MD Referred by: Babs Sciara, MD  Subjective: Chief Complaint  Patient presents with  . Lower Back - Pain   HPI: Ms Pevehouse is a pleasant 46 year old female with chronic worsening right leg pain and somewhat of an L5 distribution but somewhat nondermatomal the way she describes a referral pattern to the foot. We completed an epidural injection on 09/24/2016 that gave her quite a bit relief of her back pain but not much in the way of relief of the leg pain with paresthesia. She's had no focal weakness. No bowel or bladder dysfunction. Nothing that she would consider a flare of her multiple sclerosis. Her case is complicated by multiple sclerosis, fibromyalgia and depression. She is followed very closely by her primary care physician and she has seen Dr. Titus Dubin in the past.    ROS Otherwise per HPI.  Assessment & Plan: Visit Diagnoses:  1. Lumbar radiculopathy   2. Radiculopathy due to lumbar intervertebral disc disorder     Plan: Findings:  Due to the radicular nature as well as MRI findings from 2015 we did complete a right transforaminal epidural steroid injection at L5 diagnostically and hopefully therapeutically. If she were to not get much relief with this I think our options are repeat MRI of the lumbar spine or possibly electrodiagnostic study of the right lower limb. I would also like her to regroup with Dr. Titus Dubin and her primary care physician concerning her fibromyalgia. Other concern would be follow-up with neurology considering the diagnosis of multiple sclerosis.    Meds & Orders:  Meds ordered this encounter  Medications  . lidocaine (PF) (XYLOCAINE) 1 % injection 2 mL  . iopamidol (ISOVUE-250) 51 % injection 3 mL  . betamethasone acetate-betamethasone sodium phosphate (CELESTONE) injection 12 mg  . methylPREDNISolone acetate  (DEPO-MEDROL) injection 80 mg    Orders Placed This Encounter  Procedures  . XR C-ARM NO REPORT  . Epidural Steroid injection    Follow-up: Return if symptoms worsen or fail to improve.   Procedures: No procedures performed  Lumbosacral Transforaminal Epidural Steroid Injection - Infraneural Approach with Fluoroscopic Guidance  Patient: Melinda Moore      Date of Birth: 05/03/1971 MRN: [id number] PCP: Babs Sciara, MD      Visit Date: 10/16/2016   Universal Protocol:    Date/Time: 05/08/182:47 PM  Consent Given By: the patient  Position: PRONE   Additional Comments: Vital signs were monitored before and after the procedure. Patient was prepped and draped in the usual sterile fashion. The correct patient, procedure, and site was verified.   Injection Procedure Details:  Procedure Site One Meds Administered:  Meds ordered this encounter  Medications  . lidocaine (PF) (XYLOCAINE) 1 % injection 2 mL  . iopamidol (ISOVUE-250) 51 % injection 3 mL  . betamethasone acetate-betamethasone sodium phosphate (CELESTONE) injection 12 mg      Laterality: Right  Location/Site:  L5-S1  Needle size: 22 G  Needle type: Spinal  Needle Placement: Transforaminal  Findings:  -Contrast Used: 1 mL iohexol 180 mg iodine/mL   -Comments: Excellent flow of contrast along the nerve and into the epidural space.  Procedure Details: After squaring off the end-plates of the desired vertebral level to get a true AP view, the C-arm was obliqued to the painful side so that the superior articulating process is positioned about 1/3  the length of the inferior endplate.  The needle was aimed toward the junction of the superior articular process and the transverse process of the inferior vertebrae. The needle's initial entry is in the lower third of the foramen through Kambin's triangle. The soft tissues overlying this target were infiltrated with 2-3 ml. of 1% Lidocaine without Epinephrine.  The  spinal needle was then inserted and advanced toward the target using a "trajectory" view along the fluoroscope beam.  Under AP and lateral visualization, the needle was advanced so it did not puncture dura and did not traverse medially beyond the 6 o'clock position of the pedicle. Bi-planar projections were used to confirm position. Aspiration was confirmed to be negative for CSF and/or blood. A 1-2 ml. volume of Isovue-250 was injected and flow of contrast was noted at each level. Radiographs were obtained for documentation purposes.   After attaining the desired flow of contrast documented above, a 0.5 to 1.0 ml test dose of 0.25% Marcaine was injected into each respective transforaminal space.  The patient was observed for 90 seconds post injection.  After no sensory deficits were reported, and normal lower extremity motor function was noted,   the above injectate was administered so that equal amounts of the injectate were placed at each foramen (level) into the transforaminal epidural space.   Additional Comments:  The patient tolerated the procedure well Dressing: Band-Aid    Post-procedure details: Patient was observed during the procedure. Post-procedure instructions were reviewed.  Patient left the clinic in stable condition.   Clinical History: Lumbar spine MRI dated 10/10/2013 1. Abnormal areas of T2 hyperintensity in the lower thoracic cord and conus, compatible with demyelination and known history of multiple sclerosis.  2. Multilevel degenerative changes in the lumbar spine with a central to left paracentral disc herniation at L5-S1 resulting in slight asymmetry of the S1 nerve roots. Varying degrees of neural foraminal narrowing, most pronounced at L5-S1. 3. Facet arthropathy at L3-4 but without stenosis. Facet arthropathy at L4-5 and L5-S1 with some right foraminal narrowing at L5-S1.  She reports that she has never smoked. She has never used smokeless tobacco. No results for  input(s): HGBA1C, LABURIC in the last 8760 hours.  Objective:  VS:  HT:    WT:   BMI:     BP:133/85  HR:(!) 106bpm  TEMP: ( )  RESP:94 % Physical Exam  Musculoskeletal:  Patient ambulates without aid with good distal strength. She has some impaired sensation superficially and nondermatomal and on the right. She has no clonus bilaterally.    Ortho Exam Imaging: Xr C-arm No Report  Result Date: 10/16/2016 Please see Notes or Procedures tab for imaging impression.   Past Medical/Family/Surgical/Social History: Medications & Allergies reviewed per EMR Patient Active Problem List   Diagnosis Date Noted  . Radiculopathy due to lumbar intervertebral disc disorder 09/21/2016  . Spondylosis without myelopathy or radiculopathy, lumbar region 09/21/2016  . History of anxiety 09/08/2016  . Primary osteoarthritis of right knee 08/30/2016  . Primary insomnia 08/30/2016  . Myofascial pain 08/30/2016  . Sciatica, right side 12/12/2015  . Right knee pain 12/12/2015  . Morbid obesity (HCC) 08/12/2015  . Lumbago 02/10/2014  . Chronic anxiety 07/08/2013  . Swelling of breast 04/08/2013  . Chronic pain syndrome 03/11/2013  . Port catheter in place 12/31/2012  . Unspecified constipation 10/01/2012  . Anemia 10/01/2012  . Rectal bleeding 10/01/2012  . Elevated alkaline phosphatase level 10/01/2012  . MS (multiple sclerosis) (HCC) 09/02/2012  . DVT (  deep venous thrombosis) (HCC) 09/02/2012  . ANEMIA 04/12/2010  . NAUSEA WITH VOMITING 04/12/2010  . OTHER DYSPHAGIA 04/12/2010   Past Medical History:  Diagnosis Date  . Anemia   . Anxiety   . Bell's palsy   . Depression   . DVT (deep venous thrombosis) (HCC) 09/02/2012   [place] on 02/13/2012 showed acute DVT in left calf veins and posterior tibial veins  . Fibromyalgia   . GERD (gastroesophageal reflux disease)   . History of blood transfusion   . HTN (hypertension)   . Leukopenia   . MS (multiple sclerosis) (HCC) 1997  . Peripheral edema    . Port catheter in place 12/31/2012   Family History  Problem Relation Age of Onset  . Heart disease Father   . Hyperlipidemia Father   . Hypertension Sister   . Other Paternal Uncle     Possible colon cancer, age greater than 64  . Cirrhosis Brother     etoh   Past Surgical History:  Procedure Laterality Date  . ABDOMINAL HYSTERECTOMY    . CHOLECYSTECTOMY    . ESOPHAGOGASTRODUODENOSCOPY  2011   Dr. Jena Gauss. Possible cervical esophageal with status post disruption with passage of Maloney dilator, glycol esophageal erythema, antral erosions. Biopsy from the stomach revealed minimal chronic inactive inflammation but negative for H. pylori  . LAPAROSCOPIC GASTRIC SLEEVE RESECTION N/A 04/17/2016   Procedure: LAPAROSCOPIC GASTRIC SLEEVE RESECTION WITH UPPER ENDOSCOPY;  Surgeon: Glenna Fellows, MD;  Location: [facility];  Service: General;  Laterality: N/A;  . TUBAL LIGATION  2001   Social History   Occupational History  . Not on file.   Social History Main Topics  . Smoking status: Never Smoker  . Smokeless tobacco: Never Used  . Alcohol use No  . Drug use: No  . Sexual activity: Not on file

## 2016-10-16 NOTE — Patient Instructions (Signed)

## 2016-10-16 NOTE — Procedures (Signed)
Lumbosacral Transforaminal Epidural Steroid Injection - Infraneural Approach with Fluoroscopic Guidance  Patient: Melinda Moore      Date of Birth: [birth year] MRN: [id number] PCP: Babs Sciara, MD      Visit Date: 10/16/2016   Universal Protocol:    Date/Time: 05/08/182:47 PM  Consent Given By: the patient  Position: PRONE   Additional Comments: Vital signs were monitored before and after the procedure. Patient was prepped and draped in the usual sterile fashion. The correct patient, procedure, and site was verified.   Injection Procedure Details:  Procedure Site One Meds Administered:  Meds ordered this encounter  Medications  . lidocaine (PF) (XYLOCAINE) 1 % injection 2 mL  . iopamidol (ISOVUE-250) 51 % injection 3 mL  . betamethasone acetate-betamethasone sodium phosphate (CELESTONE) injection 12 mg      Laterality: Right  Location/Site:  L5-S1  Needle size: 22 G  Needle type: Spinal  Needle Placement: Transforaminal  Findings:  -Contrast Used: 1 mL iohexol 180 mg iodine/mL   -Comments: Excellent flow of contrast along the nerve and into the epidural space.  Procedure Details: After squaring off the end-plates of the desired vertebral level to get a true AP view, the C-arm was obliqued to the painful side so that the superior articulating process is positioned about 1/3 the length of the inferior endplate.  The needle was aimed toward the junction of the superior articular process and the transverse process of the inferior vertebrae. The needle's initial entry is in the lower third of the foramen through Kambin's triangle. The soft tissues overlying this target were infiltrated with 2-3 ml. of 1% Lidocaine without Epinephrine.  The spinal needle was then inserted and advanced toward the target using a "trajectory" view along the fluoroscope beam.  Under AP and lateral visualization, the needle was advanced so it did not puncture dura and did not traverse medially  beyond the 6 o'clock position of the pedicle. Bi-planar projections were used to confirm position. Aspiration was confirmed to be negative for CSF and/or blood. A 1-2 ml. volume of Isovue-250 was injected and flow of contrast was noted at each level. Radiographs were obtained for documentation purposes.   After attaining the desired flow of contrast documented above, a 0.5 to 1.0 ml test dose of 0.25% Marcaine was injected into each respective transforaminal space.  The patient was observed for 90 seconds post injection.  After no sensory deficits were reported, and normal lower extremity motor function was noted,   the above injectate was administered so that equal amounts of the injectate were placed at each foramen (level) into the transforaminal epidural space.   Additional Comments:  The patient tolerated the procedure well Dressing: Band-Aid    Post-procedure details: Patient was observed during the procedure. Post-procedure instructions were reviewed.  Patient left the clinic in stable condition.

## 2016-10-18 ENCOUNTER — Ambulatory Visit (HOSPITAL_COMMUNITY): Payer: Self-pay | Admitting: Physical Therapy

## 2016-10-23 ENCOUNTER — Ambulatory Visit (HOSPITAL_COMMUNITY): Payer: Self-pay | Admitting: Physical Therapy

## 2016-10-25 ENCOUNTER — Ambulatory Visit (HOSPITAL_COMMUNITY): Payer: Self-pay | Admitting: Physical Therapy

## 2016-12-11 ENCOUNTER — Other Ambulatory Visit: Payer: Self-pay | Admitting: Family Medicine

## 2016-12-14 ENCOUNTER — Ambulatory Visit: Payer: Medicare Other | Admitting: Family Medicine

## 2016-12-20 ENCOUNTER — Encounter: Payer: Self-pay | Admitting: Family Medicine

## 2016-12-20 ENCOUNTER — Ambulatory Visit (INDEPENDENT_AMBULATORY_CARE_PROVIDER_SITE_OTHER): Payer: Medicare Other | Admitting: Family Medicine

## 2016-12-20 VITALS — BP 122/82 | Ht 67.0 in | Wt 230.0 lb

## 2016-12-20 DIAGNOSIS — E784 Other hyperlipidemia: Secondary | ICD-10-CM

## 2016-12-20 DIAGNOSIS — Z79891 Long term (current) use of opiate analgesic: Secondary | ICD-10-CM

## 2016-12-20 DIAGNOSIS — M5431 Sciatica, right side: Secondary | ICD-10-CM | POA: Diagnosis not present

## 2016-12-20 DIAGNOSIS — G894 Chronic pain syndrome: Secondary | ICD-10-CM

## 2016-12-20 DIAGNOSIS — Z79899 Other long term (current) drug therapy: Secondary | ICD-10-CM | POA: Diagnosis not present

## 2016-12-20 DIAGNOSIS — E7849 Other hyperlipidemia: Secondary | ICD-10-CM

## 2016-12-20 DIAGNOSIS — R7303 Prediabetes: Secondary | ICD-10-CM

## 2016-12-20 MED ORDER — OXYCODONE-ACETAMINOPHEN 7.5-325 MG PO TABS: 1.0000 | ORAL_TABLET | Freq: Three times a day (TID) | ORAL | 0 refills | Status: DC | PRN

## 2016-12-20 MED ORDER — METHYLPHENIDATE HCL 10 MG PO TABS: ORAL_TABLET | ORAL | 0 refills | Status: DC

## 2016-12-20 NOTE — Progress Notes (Signed)
   Subjective:    Patient ID: Melinda Moore, female    DOB: November 10, 1970, 46 y.o.   MRN: [id number]  HPI This patient was seen today for chronic pain  The medication list was reviewed and updated.   -Compliance with medication: yes  - Number patient states they take daily: 3   -when was the last dose patient took? today  The patient was advised the importance of maintaining medication and not using illegal substances with these.  Refills needed: no   The patient was educated that we can provide 3 monthly scripts for their medication, it is their responsibility to follow the instructions.  Side effects or complications from medications: none  Patient is aware that pain medications are meant to minimize the severity of the pain to allow their pain levels to improve to allow for better function. They are aware of that pain medications cannot totally remove their pain.  Due for UDT ( at least once per year) : today Patient relates she takes her pain medicine She does take her ADD medicine to help her She denies a causing drowsiness Does take her gabapentin Denies drowsiness States suffers with MS pain from MS as well as fibromyalgia        Review of Systems  Constitutional: Negative for activity change and appetite change.  Gastrointestinal: Negative for abdominal pain and vomiting.  Neurological: Negative for weakness.  Psychiatric/Behavioral: Negative for confusion.       Objective:   Physical Exam  Constitutional: She appears well-nourished. No distress.  HENT:  Head: Normocephalic.  Cardiovascular: Normal rate, regular rhythm and normal heart sounds.   No murmur heard. Pulmonary/Chest: Effort normal and breath sounds normal.  Musculoskeletal: She exhibits no edema.  Lymphadenopathy:    She has no cervical adenopathy.  Neurological: She is alert.  Psychiatric: Her behavior is normal.  Vitals reviewed.         Assessment & Plan:  Chronic pain-patient has  chronic pain from fibromyalgia and MS using oxycodone responsibly drug registry checked 13 times a day caution drowsiness  Intermittent sciatica right side oxycodone should help this nonsurgical at this point  Hyperlipidemia check lipid profile  Prediabetes she is losing a lot of weight from gastric bypass of therefore this should resolve itself we will check an A1c  Chronic pain and discomfort with Tegretol once a day for facial pain from Bell's palsy  Uses muscle relaxers sparingly caution drowsiness

## 2016-12-28 LAB — TOXASSURE SELECT 13 (MW), URINE

## 2016-12-28 LAB — PLEASE NOTE

## 2017-01-14 ENCOUNTER — Ambulatory Visit (INDEPENDENT_AMBULATORY_CARE_PROVIDER_SITE_OTHER): Payer: Medicare Other | Admitting: Family Medicine

## 2017-01-14 ENCOUNTER — Ambulatory Visit (HOSPITAL_COMMUNITY)
Admission: RE | Admit: 2017-01-14 | Discharge: 2017-01-14 | Disposition: A | Discharge status: Home / Self Care | Payer: Medicare Other | Source: Ambulatory Visit | Attending: Family Medicine | Admitting: Family Medicine

## 2017-01-14 ENCOUNTER — Encounter: Payer: Self-pay | Admitting: Family Medicine

## 2017-01-14 VITALS — Ht 67.0 in | Wt 230.0 lb

## 2017-01-14 DIAGNOSIS — M79604 Pain in right leg: Secondary | ICD-10-CM | POA: Diagnosis not present

## 2017-01-14 NOTE — Progress Notes (Signed)
   Subjective:    Patient ID: Melinda Moore, female    DOB: [birth year], 46 y.o.   MRN: [id number]  HPI Patient arrives with c/o right leg pain for a long time-notices a knot on the lateral side of the leg this am- Patient states she has d-dimer and ultrasound of the leg a few months ago Patient relates a lump on the outer aspect of her right lower leg she wonders if this may be a blood clot she was in the past on blood thinners she denies any severe pain but does state intermittent right leg pain is been normal for her she is on pain medication try and come on the lower dose.  Review of Systems Please see above. Denies shortness of breath or sharp chest pains    Objective:   Physical Exam Lungs are clear hearts regular pulse normal lower leg 1 inch in diameter bigger on the right side and left side there is a slight tenderness on the outer aspect of the leg but pain  I doubt pulmonary embolism we do need to rule out a DVT     Assessment & Plan:  Leg pain Stat scan ultrasound ordered If progressive troubles or if worse may need other intervention If test negative ibuprofen with warm compresses for possible superficial thrombophlebitis  Ultrasound was negative for DVT she will follow-up in 2 days for recheck and a follow-up on her chronic pain

## 2017-01-16 ENCOUNTER — Encounter: Payer: Self-pay | Admitting: Family Medicine

## 2017-01-16 ENCOUNTER — Ambulatory Visit (INDEPENDENT_AMBULATORY_CARE_PROVIDER_SITE_OTHER): Payer: Medicare Other | Admitting: Family Medicine

## 2017-01-16 VITALS — BP 112/78 | Ht 67.0 in | Wt 230.0 lb

## 2017-01-16 DIAGNOSIS — M792 Neuralgia and neuritis, unspecified: Secondary | ICD-10-CM

## 2017-01-16 DIAGNOSIS — Z79891 Long term (current) use of opiate analgesic: Secondary | ICD-10-CM | POA: Diagnosis not present

## 2017-01-16 MED ORDER — OXYCODONE-ACETAMINOPHEN 10-325 MG PO TABS: 1.0000 | ORAL_TABLET | Freq: Four times a day (QID) | ORAL | 0 refills | Status: DC | PRN

## 2017-01-16 NOTE — Progress Notes (Signed)
   Subjective:    Patient ID: Melinda Moore, female    DOB: 1970-07-12, 46 y.o.   MRN: [id number]  HPI Patient arrives to discuss recent drug test results. This patient relates severe leg pain starts at the knee runs down the side of her leg to the ankle she describes burning discomfort. She relates she has to take pain medicine sometimes anywhere from 3-5 per day. We did try to go to a lower dose on the pain medicine but she states this is not helping enough she would like to go back to previous dose. Patient denies abusing the medicine. She states she ran out a few days ago her pill count only shows a few pills so she is out of medicine approximately 5 days to soon Drug registry was checked Review of Systems    she denies fatigue tiredness or sleepiness with the medicine denies abusing it Objective:   Physical Exam Lungs clear hearts regular pulse normal extremities no edema subjective discomfort right lower leg       Assessment & Plan:  Right lower leg neuropathic pain referral to neurology for nerve conduction studies. Patient is RD had MRI of the spine She does have MS We also increased her gabapentin. She states she is taking 300 mg 4 in the morning for midday and 4 in the evening denies drowsiness with it Stop oxycodone 7.5 Use oxycodone 10 mg/325 One 4 times a day when necessary pain Recheck in 2 weeks to do a pill count and urine drug screen at that visit  Patient was also counseled that she feels drowsy drug or out of that she needs to seriously back off on medication and notify us right away She occasionally uses Valium for muscle spasms from her MS doctor she was advised not to take this with pain medicine because a risk of accidental overdose

## 2017-01-22 ENCOUNTER — Encounter: Payer: Self-pay | Admitting: Family Medicine

## 2017-02-01 ENCOUNTER — Encounter: Payer: Self-pay | Admitting: Family Medicine

## 2017-02-01 ENCOUNTER — Ambulatory Visit (INDEPENDENT_AMBULATORY_CARE_PROVIDER_SITE_OTHER): Payer: 59 | Admitting: Family Medicine

## 2017-02-01 VITALS — BP 124/78 | Ht 67.0 in | Wt 226.0 lb

## 2017-02-01 DIAGNOSIS — Z79891 Long term (current) use of opiate analgesic: Secondary | ICD-10-CM | POA: Diagnosis not present

## 2017-02-01 MED ORDER — FOLIKA-V 1 MG PO TABS: 1.0000 | ORAL_TABLET | Freq: Two times a day (BID) | ORAL | 12 refills | Status: DC

## 2017-02-01 MED ORDER — OXYCODONE-ACETAMINOPHEN 10-325 MG PO TABS: 1.0000 | ORAL_TABLET | ORAL | 0 refills | Status: DC | PRN

## 2017-02-01 NOTE — Progress Notes (Signed)
   Subjective:    Patient ID: Melinda Moore, female    DOB: 1971-02-05, 46 y.o.   MRN: [id number]  HPI Patient is here today to follow up on the drug screen test that was abnormal. 15 minutes spent with patient today discussing pain management discussing her pill count doing it in front of her plus also discussing proper expectations of pain medicine patient will be seen neurology in the near future to get nerve conduction study she denies abusing medicine denies it causes any drowsiness she was counseled not to take Valium for muscle spasms because of potential for drug overdose  Review of Systems     Objective:   Physical Exam  Lungs clear heart regular subjected discomfort right leg with sciatica      Assessment & Plan:  Severe sciatica right side of the leg pain discomfort states has to take oxycodone for 5 times a day 10 order to keep the pain reasonable so she can function she denies abusing the medicine her pill count was verified today urine drug screen was sent  I did allow for her to be on 5 tablets a day 2 prescriptions given she will bring her prescription bottle back with her on follow-up possibly will need to do another urine drug screen at that time

## 2017-02-04 ENCOUNTER — Other Ambulatory Visit: Payer: Self-pay | Admitting: Rheumatology

## 2017-02-04 NOTE — Telephone Encounter (Signed)
Last Visit: 09/11/16 Next Visit: 03/13/17  Okay to refill per Dr. Corliss Skains

## 2017-02-06 LAB — TOXASSURE SELECT 13 (MW), URINE

## 2017-02-18 ENCOUNTER — Other Ambulatory Visit: Payer: Self-pay | Admitting: Family Medicine

## 2017-02-26 DIAGNOSIS — Z79899 Other long term (current) drug therapy: Secondary | ICD-10-CM | POA: Diagnosis not present

## 2017-02-26 DIAGNOSIS — G35 Multiple sclerosis: Secondary | ICD-10-CM | POA: Diagnosis not present

## 2017-03-11 ENCOUNTER — Other Ambulatory Visit: Payer: Self-pay | Admitting: Family Medicine

## 2017-03-11 DIAGNOSIS — Z1231 Encounter for screening mammogram for malignant neoplasm of breast: Secondary | ICD-10-CM

## 2017-03-12 ENCOUNTER — Encounter: Payer: Self-pay | Admitting: Neurology

## 2017-03-12 ENCOUNTER — Ambulatory Visit (INDEPENDENT_AMBULATORY_CARE_PROVIDER_SITE_OTHER): Payer: Medicare Other | Admitting: Neurology

## 2017-03-12 VITALS — BP 100/68 | HR 81 | Wt 235.5 lb

## 2017-03-12 DIAGNOSIS — R269 Unspecified abnormalities of gait and mobility: Secondary | ICD-10-CM

## 2017-03-12 DIAGNOSIS — M79604 Pain in right leg: Secondary | ICD-10-CM | POA: Diagnosis not present

## 2017-03-12 NOTE — Progress Notes (Addendum)
PATIENT: Melinda Moore DOB: [birth year]  Chief Complaint  Patient presents with  . Neuropathic Pain    She is here to be evaluated for NCV/EMG. Reports constant R leg pain that starts in the knee and radiates to her toes. Present for more than one year. She has fibromyalgia but rheumatology did not feel like it was related to the leg pain. She does have MS and is taking Gilenya. She uses a brace around R calf and finds that pressure helps the pain.   Marland Kitchen PCP    Babs Sciara, MD     HISTORICAL  Melinda Moore is a 46 year old female, seen in refer by her primary care doctor  Lilyan Punt for evaluation of right leg pain, initial evaluation was on March 12 2017.  I reviewed and summarized referral note, she was diagnosed with multiple sclerosis since 1997, is currently followed by [facility] Dr. 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  Over past 1 year, she was seen by orthopedic surgeon, there was no significant abnormality found, epidural injection has helped her low back pain, but did not help her right leg symptoms.  She described deep achy from being pain starting from right spine, spreading down to her right knee and right calf region, she denies left leg symptoms, denies significant low back pain, denied bowel and  bladder incontinence.  Doppler study of right lower extremity was reported normal in August 2018, laboratory evaluation showed normal BMP, CBC  REVIEW OF SYSTEMS: Full 14 system review of systems performed and notable only for numbness, weakness, anxiety, joint pain, achy muscles, eye pain, swelling, fatigue  ALLERGIES: Allergies  Allergen Reactions  . Ambien [Zolpidem Tartrate]     nightmares  . Tape Itching and Rash    HOME MEDICATIONS: Current Outpatient Prescriptions  Medication Sig Dispense Refill  . aspirin EC 81 MG tablet Take 81 mg by mouth daily as needed for moderate pain.    . B Complex-C (SUPER B COMPLEX PO) Take 1 tablet by mouth daily.    Marland Kitchen Bioflavonoid Products (BIOFLEX) TABS Take 2 tablets by mouth daily.    . carbamazepine (TEGRETOL XR) 200 MG 12 hr tablet TAKE ONE TABLET 3 TIMES A DAY AS NEEDED. (Patient taking differently: take 200mg s twice daily) 90 tablet 3  . cholecalciferol (VITAMIN D) 1000 units tablet Take 1,000 Units by mouth daily.    . diclofenac sodium (VOLTAREN) 1 % GEL APPLY 2 GRAMS 4 TIMES DAILY 300 g 3  . Fingolimod HCl (GILENYA) 0.5 MG CAPS Take 1 capsule by mouth daily.      Marland Kitchen gabapentin (NEURONTIN) 300 MG capsule Take 1 capsule (300 mg total) by mouth 3 (three) times daily. (Patient taking differently: Take 1,200 mg by mouth 3 (three) times daily. States 300mg  tabs-  Takes 4 tab in am, 4  at lunch, 4 at bedtime) 90 capsule 6  . LINZESS 145 MCG CAPS capsule TAKE ONE (1) CAPSULE EACH DAY 30 capsule 12  . metaxalone (SKELAXIN) 800 MG tablet TAKE ONE TABLET THREE TIMES DAILY AS NEEDED FOR MUSCLE SPASMS 90 tablet 2  . methylphenidate (RITALIN) 10 MG tablet 1 qam and 1q noon 60 tablet 0  . Multiple Vitamin (MULTIVITAMIN) tablet Take 1 tablet by mouth daily.    . Multiple Vitamins with FA (FOLIKA-V) 1 MG TABS Take 1 tablet by mouth 2 (two) times daily. 60 tablet 12  . Multiple Vitamins-Minerals (EMERGEN-C IMMUNE) PACK Take 1 packet by mouth daily as needed  (immune support).    Marland Kitchen oxyCODONE-acetaminophen (PERCOCET) 10-325 MG tablet Take 1 tablet by mouth every 4 (four) hours as needed for pain. 5 per day max 150 tablet 0  . Polyethylene Glycol 400 (BLINK TEARS) 0.25 % SOLN Place 1 drop into the right eye 2 (two) times daily as needed (dry eyes).     . potassium chloride SA (K-DUR,KLOR-CON) 20 MEQ tablet TAKE ONE TABLET TWICE DAILY 60 tablet 1  . torsemide (DEMADEX) 20 MG tablet TAKE TWO TABLETS IN THE MORNING AND ONE TABLET AT NOON IF NEEDED 90 tablet 1  . venlafaxine XR (EFFEXOR-XR) 75 MG 24 hr capsule Take 75 mg by mouth 3 (three) times daily.    . vitamin E 1000 UNIT capsule Take 1,000 Units by mouth daily.     Current Facility-Administered Medications  Medication Dose Route Frequency Provider Last Rate Last Dose  . betamethasone acetate-betamethasone sodium phosphate (CELESTONE) injection 12 mg  12 mg Other Once Tyrell Antonio, MD        PAST MEDICAL HISTORY: Past Medical History:  Diagnosis Date  . Anemia   . Anxiety   . Bell's palsy   . Depression   . DVT (deep venous thrombosis) (HCC) 09/02/2012   [place] on 02/13/2012 showed acute DVT in left calf veins and posterior tibial veins  . Fibromyalgia   . GERD (gastroesophageal reflux disease)   . History of blood transfusion   . HTN (hypertension)   . Leukopenia   . MS (multiple sclerosis) (HCC) 1997  . Neuropathic pain   . Peripheral edema   . Port catheter in place 12/31/2012    PAST SURGICAL HISTORY: Past Surgical History:  Procedure Laterality Date  . ABDOMINAL HYSTERECTOMY    . CHOLECYSTECTOMY    . ESOPHAGOGASTRODUODENOSCOPY  2011   Dr. Jena Gauss. Possible cervical esophageal with status post disruption with passage of Maloney dilator, glycol esophageal erythema, antral erosions. Biopsy from the stomach revealed minimal chronic inactive inflammation but negative for H. pylori  . LAPAROSCOPIC GASTRIC SLEEVE RESECTION N/A 04/17/2016   Procedure: LAPAROSCOPIC GASTRIC SLEEVE RESECTION  WITH UPPER ENDOSCOPY;  Surgeon: Glenna Fellows, MD;  Location: [facility];  Service: General;  Laterality: N/A;  . TUBAL LIGATION  2001    FAMILY HISTORY: Family History  Problem Relation Age of Onset  . Heart disease Father   . Hyperlipidemia Father   . Congestive Heart Failure Father   . Hypertension Sister   . Other Paternal Uncle        Possible colon cancer, age greater than 42  . Cirrhosis Brother        etoh  . Healthy Mother     SOCIAL HISTORY:  Social History   Social History  . Marital status: Married    Spouse name: N/A  . Number of children: 2  . Years of education: some college  Occupational History  . disabled    Social History Main Topics  . Smoking status: Former Smoker    Types: Cigarettes    Quit date: 50  . Smokeless tobacco: Never Used  . Alcohol use No  . Drug use: No  . Sexual activity: Not on file   Other Topics Concern  . Not on file   Social History Narrative   Lives at home with husband and 2 daughters   Right handed   Takes 1/2 caffeine pill per day     PHYSICAL EXAM   Vitals:   03/12/17 1425  BP: 100/68  Pulse: 81  Weight: 235 lb 8 oz (106.8 kg)    Not recorded      Body mass index is 36.88 kg/m.  PHYSICAL EXAMNIATION:  Gen: NAD, conversant, well nourised, obese, well groomed                     Cardiovascular: Regular rate rhythm, no peripheral edema, warm, nontender. Eyes: Conjunctivae clear without exudates or hemorrhage Neck: Supple, no carotid bruits. Pulmonary: Clear to auscultation bilaterally   NEUROLOGICAL EXAM:  MENTAL STATUS: Speech:    Speech is normal; fluent and spontaneous with normal comprehension.  Cognition:     Orientation to time, place and person     Normal recent and remote memory     Normal Attention span and concentration     Normal Language, naming, repeating,spontaneous speech     Fund of knowledge   CRANIAL NERVES: CN II: Visual fields are full to confrontation. Fundoscopic  exam is normal with sharp discs and no vascular changes. Pupils are round equal and briskly reactive to light. CN III, IV, VI: extraocular movement are normal. No ptosis. CN V: Facial sensation is intact to pinprick in all 3 divisions bilaterally. Corneal responses are intact.  CN VII: Face is symmetric with normal eye closure and smile. CN VIII: Hearing is normal to rubbing fingers CN IX, X: Palate elevates symmetrically. Phonation is normal. CN XI: Head turning and shoulder shrug are intact CN XII: Tongue is midline with normal movements and no atrophy.  MOTOR: There is no pronator drift of out-stretched arms. Muscle bulk and tone are normal. Muscle strength is normal.  REFLEXES: Reflexes are 2+ and symmetric at the biceps, triceps, knees, and ankles. Plantar responses are flexor.  SENSORY: Intact to light touch, pinprick, positional sensation and vibratory sensation are intact in fingers and toes.  COORDINATION: Rapid alternating movements and fine finger movements are intact. There is no dysmetria on finger-to-nose and heel-knee-shin.    GAIT/STANCE: She needs to push up to get up from seated position, mildly unsteady,  DIAGNOSTIC DATA (LABS, IMAGING, TESTING) - I reviewed patient records, labs, notes, testing and imaging myself where available.   ASSESSMENT AND PLAN  KASHEENA SAMBRANO is a 46 y.o. female   Relapsing remitting multiple sclerosis  Followed up [facility] Dr. Harlen Labs  Is taking Gilenya  Most recent MRI in 2015 showed multiple foci of T2/FLAIR hyperintensity signal in the white matter, no change compared to previous scan Right lower extremity deep achy pain, history suggestive of claudication,  Vascular claudication versus neurogenic claudications, history of left lower extremity DVT, negative right lower extremity DVT in August 2018  CT angiogram of right lower extremity  MRI of lumbar spine in 2015 showed abnormal area of T2 hyperintensity in the right lower  thoracic cord and conus, the conus terminate at L1, multilevel degenerative changes, no significant  canal or foraminal narrowing  MRI of cervical spine showed mild degenerative changes, no significant canal narrowing  EMG/NCS   Levert Feinstein, M.D. Ph.D.  [facility] [address], [address] [place], [place] [address] Ph: [phone] Fax: [phone]  CC: Babs Sciara, MD

## 2017-03-13 ENCOUNTER — Ambulatory Visit: Payer: Medicare Other | Admitting: Rheumatology

## 2017-03-15 ENCOUNTER — Ambulatory Visit (HOSPITAL_COMMUNITY): Payer: Self-pay

## 2017-03-19 ENCOUNTER — Ambulatory Visit: Payer: Medicare Other | Admitting: Family Medicine

## 2017-03-21 ENCOUNTER — Ambulatory Visit: Payer: Medicare Other | Admitting: Family Medicine

## 2017-03-21 ENCOUNTER — Ambulatory Visit (HOSPITAL_COMMUNITY): Payer: Self-pay

## 2017-03-22 ENCOUNTER — Ambulatory Visit: Payer: Medicare Other | Admitting: Family Medicine

## 2017-03-25 ENCOUNTER — Other Ambulatory Visit: Payer: Self-pay | Admitting: Family Medicine

## 2017-03-25 ENCOUNTER — Ambulatory Visit (HOSPITAL_COMMUNITY)
Admission: RE | Admit: 2017-03-25 | Discharge: 2017-03-25 | Disposition: A | Discharge status: Home / Self Care | Payer: Medicare Other | Source: Ambulatory Visit | Attending: Family Medicine | Admitting: Family Medicine

## 2017-03-25 ENCOUNTER — Encounter: Payer: Self-pay | Admitting: Family Medicine

## 2017-03-25 ENCOUNTER — Ambulatory Visit (INDEPENDENT_AMBULATORY_CARE_PROVIDER_SITE_OTHER): Payer: 59 | Admitting: Family Medicine

## 2017-03-25 VITALS — BP 120/80 | Ht 67.0 in | Wt 237.0 lb

## 2017-03-25 DIAGNOSIS — Z79891 Long term (current) use of opiate analgesic: Secondary | ICD-10-CM | POA: Diagnosis not present

## 2017-03-25 DIAGNOSIS — Z1231 Encounter for screening mammogram for malignant neoplasm of breast: Secondary | ICD-10-CM | POA: Diagnosis not present

## 2017-03-25 DIAGNOSIS — Z23 Encounter for immunization: Secondary | ICD-10-CM

## 2017-03-25 DIAGNOSIS — G35 Multiple sclerosis: Secondary | ICD-10-CM | POA: Diagnosis not present

## 2017-03-25 DIAGNOSIS — R928 Other abnormal and inconclusive findings on diagnostic imaging of breast: Secondary | ICD-10-CM

## 2017-03-25 MED ORDER — OXYCODONE-ACETAMINOPHEN 10-325 MG PO TABS: 1.0000 | ORAL_TABLET | ORAL | 0 refills | Status: DC | PRN

## 2017-03-25 MED ORDER — METHYLPHENIDATE HCL 10 MG PO TABS
ORAL_TABLET | ORAL | 0 refills | Status: DC
Start: 2017-03-25 — End: 2017-03-25

## 2017-03-25 MED ORDER — METHYLPHENIDATE HCL 10 MG PO TABS: ORAL_TABLET | ORAL | 0 refills | Status: DC

## 2017-03-25 NOTE — Progress Notes (Signed)
   Subjective:    Patient ID: Melinda Moore, female    DOB: [birth year], 46 y.o.   MRN: [id number]  HPI  This patient was seen today for chronic pain  The medication list was reviewed and updated.   -Compliance with medication: Yes - Number patient states they take daily: 5 daily  -when was the last dose patient took? This afternoon around 1pm.  The patient was advised the importance of maintaining medication and not using illegal substances with these.  Refills needed: Yes  The patient was educated that we can provide 3 monthly scripts for their medication, it is their responsibility to follow the instructions.  Side effects or complications from medications: none  Patient is aware that pain medications are meant to minimize the severity of the pain to allow their pain levels to improve to allow for better function. They are aware of that pain medications cannot totally remove their pain.  Due for UDT ( at least once per year) : had done on 02/01/2017      Review of Systems  Constitutional: Negative for activity change and appetite change.  HENT: Negative for congestion.   Respiratory: Negative for cough.   Cardiovascular: Negative for chest pain.  Gastrointestinal: Negative for abdominal pain and vomiting.  Skin: Negative for color change.  Neurological: Negative for weakness.  Psychiatric/Behavioral: Negative for confusion.       Objective:   Physical Exam  Constitutional: She appears well-nourished. No distress.  HENT:  Head: Normocephalic.  Right Ear: External ear normal.  Left Ear: External ear normal.  Eyes: Right eye exhibits no discharge. Left eye exhibits no discharge.  Neck: No tracheal deviation present.  Cardiovascular: Normal rate, regular rhythm and normal heart sounds.   No murmur heard. Pulmonary/Chest: Effort normal and breath sounds normal. No respiratory distress. She has no wheezes. She has no rales.  Musculoskeletal: She exhibits no edema.    Lymphadenopathy:    She has no cervical adenopathy.  Neurological: She is alert.  Psychiatric: Her behavior is normal.  Vitals reviewed.         Assessment & Plan:  The patient was seen today as part of a comprehensive visit regarding pain control. Patient's compliance with the medication as well as discussion regarding effectiveness was completed. Prescriptions were written. Patient was advised to follow-up in 3 months. The patient was assessed for any signs of severe side effects. The patient was advised to take the medicine as directed and to report to [place] if any side effect issues.  Patient also uses Ritalin to help her with her energy her MS doctor recommended that it seems to help her we will continue this 3 prescriptions were given right registry was checked  Patient will be seen neurology near future for nerve conduction study to look at her leg pain close

## 2017-03-26 ENCOUNTER — Telehealth: Payer: Self-pay | Admitting: Family Medicine

## 2017-03-26 NOTE — Telephone Encounter (Signed)
I did talk with the patient regarding her mammogram she states she will follow through with doing diagnostic mammogram and ultrasound in we await the results of this. More than likely radiologist will interpret the test while she is there and discuss the results with her.

## 2017-03-26 NOTE — Telephone Encounter (Signed)
Patient had her mammogram done yesterday.  She got a call back this morning and was told that she needed to have more extensive testing done.  She said she was going to have to speak to a doctor after the testing was done.  She said the lady she spoke with about her mammogram gave her little information and wanted to know if Dr. Lorin Picket could look at this and tell her what is going on.

## 2017-03-27 ENCOUNTER — Encounter: Payer: Medicare Other | Admitting: Neurology

## 2017-04-09 ENCOUNTER — Ambulatory Visit (HOSPITAL_COMMUNITY)
Admission: RE | Admit: 2017-04-09 | Discharge: 2017-04-09 | Disposition: A | Discharge status: Home / Self Care | Payer: 59 | Source: Ambulatory Visit | Attending: Family Medicine | Admitting: Family Medicine

## 2017-04-09 DIAGNOSIS — R928 Other abnormal and inconclusive findings on diagnostic imaging of breast: Secondary | ICD-10-CM | POA: Diagnosis present

## 2017-04-12 ENCOUNTER — Encounter: Payer: Medicare Other | Admitting: Neurology

## 2017-04-17 ENCOUNTER — Other Ambulatory Visit: Payer: Self-pay | Admitting: Family Medicine

## 2017-04-18 NOTE — Telephone Encounter (Signed)
May have 6 months of each medicine

## 2017-05-10 ENCOUNTER — Encounter: Payer: Medicare Other | Admitting: Neurology

## 2017-05-13 ENCOUNTER — Encounter: Payer: Self-pay | Admitting: Neurology

## 2017-06-14 ENCOUNTER — Ambulatory Visit (INDEPENDENT_AMBULATORY_CARE_PROVIDER_SITE_OTHER): Payer: Medicare Other | Admitting: Neurology

## 2017-06-14 DIAGNOSIS — R202 Paresthesia of skin: Secondary | ICD-10-CM | POA: Diagnosis not present

## 2017-06-14 DIAGNOSIS — G35 Multiple sclerosis: Secondary | ICD-10-CM | POA: Diagnosis not present

## 2017-06-14 DIAGNOSIS — R269 Unspecified abnormalities of gait and mobility: Secondary | ICD-10-CM

## 2017-06-14 DIAGNOSIS — M79604 Pain in right leg: Secondary | ICD-10-CM

## 2017-06-14 MED ORDER — DULOXETINE HCL 60 MG PO CPEP: 60.0000 mg | ORAL_CAPSULE | Freq: Every day | ORAL | 12 refills | Status: DC

## 2017-06-14 NOTE — Progress Notes (Signed)
PATIENT: Melinda Moore DOB: 1971-03-19  No chief complaint on file.    HISTORICAL  Melinda Moore is a 47 year old female, seen in refer by her primary care doctor  Lilyan Punt for evaluation of right leg pain, initial evaluation was on March 12 2017.  I reviewed and summarized referral note, she was diagnosed with multiple sclerosis since 1997, is currently followed by [facility] Dr. 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  Over past 1 year, she was seen by orthopedic surgeon, there was no significant abnormality found, epidural injection has helped her low back pain, but did not help her right leg symptoms.  She described deep achy from being pain starting from right spine, spreading down to her right knee and right calf region, she denies left leg symptoms, denies significant low back pain, denied bowel and bladder incontinence.  Doppler study of right lower extremity was reported normal in August 2018, laboratory evaluation showed normal BMP, CBC  UPDATE Jun 14 2017: She return for electrodiagnostic study today, which showed no evidence of right lower extremity neuropathy, right lumbosacral radiculopathy.  She has been taking Effexor XR 75 mg 3 times day for anxiety, her mood is overall stable, is also on maximum dose  of gabapentin 3600 mg a day,  She continue complains of right lower extremity with numbness from knee down, deep achy pain,  REVIEW OF SYSTEMS: Full 14 system review of systems performed and notable only for numbness, weakness, anxiety, joint pain, achy muscles, eye pain, swelling, fatigue  ALLERGIES: Allergies  Allergen Reactions  . Ambien [Zolpidem Tartrate]     nightmares  . Tape Itching and Rash    HOME MEDICATIONS: Current Outpatient Medications  Medication Sig Dispense Refill  . aspirin EC 81 MG tablet Take 81 mg by mouth daily as needed for moderate pain.    . B Complex-C (SUPER B COMPLEX PO) Take 1 tablet by mouth daily.    Marland Kitchen Bioflavonoid Products (BIOFLEX) TABS Take 2 tablets by mouth daily.    . carbamazepine (TEGRETOL XR) 200 MG 12 hr tablet TAKE ONE TABLET 3 TIMES A DAY AS NEEDED. 90 tablet 1  . cholecalciferol (VITAMIN D) 1000 units tablet Take 1,000 Units by mouth daily.    . diclofenac sodium (VOLTAREN) 1 % GEL APPLY 2 GRAMS 4 TIMES DAILY 300 g 3  . DULoxetine (CYMBALTA) 60 MG capsule Take 1 capsule (60 mg total) by mouth daily. 30 capsule 12  . Fingolimod HCl (GILENYA) 0.5 MG CAPS Take 1 capsule by mouth daily.      Marland Kitchen gabapentin (NEURONTIN) 300 MG capsule Take 1 capsule (300 mg total) by mouth 3 (three) times daily. (Patient taking differently: Take 1,200 mg by mouth 3 (three) times daily. States 300mg  tabs-  Takes 4 tab in am, 4 at lunch, 4 at bedtime) 90  capsule 6  . LINZESS 145 MCG CAPS capsule TAKE ONE (1) CAPSULE EACH DAY 30 capsule 12  . metaxalone (SKELAXIN) 800 MG tablet TAKE ONE TABLET THREE TIMES DAILY AS NEEDED FOR MUSCLE SPASMS 90 tablet 2  . methylphenidate (RITALIN) 10 MG tablet 1 qam and 1q noon 60 tablet 0  . Multiple Vitamin (MULTIVITAMIN) tablet Take 1 tablet by mouth daily.    . Multiple Vitamins with FA (FOLIKA-V) 1 MG TABS Take 1 tablet by mouth 2 (two) times daily. 60 tablet 12  . Multiple Vitamins-Minerals (EMERGEN-C IMMUNE) PACK Take 1 packet by  mouth daily as needed (immune support).    Marland Kitchen oxyCODONE-acetaminophen (PERCOCET) 10-325 MG tablet Take 1 tablet by mouth every 4 (four) hours as needed for pain. 5 per day max 150 tablet 0  . Polyethylene Glycol 400 (BLINK TEARS) 0.25 % SOLN Place 1 drop into the right eye 2 (two) times daily as needed (dry eyes).     . potassium chloride SA (K-DUR,KLOR-CON) 20 MEQ tablet TAKE ONE TABLET BY MOUTH TWICE A DAY 60 tablet 5  . torsemide (DEMADEX) 20 MG tablet TAKE TWO TABLETS IN THE MORNING AND ONE TABLET AT NOON IF NEEDED 90 tablet 1  . venlafaxine XR (EFFEXOR-XR) 75 MG 24 hr capsule Take 75 mg by mouth 3 (three) times daily.    . vitamin E 1000 UNIT capsule Take 1,000 Units by mouth daily.     Current Facility-Administered Medications  Medication Dose Route Frequency Provider Last Rate Last Dose  . betamethasone acetate-betamethasone sodium phosphate (CELESTONE) injection 12 mg  12 mg Other Once Tyrell Antonio, MD        PAST MEDICAL HISTORY: Past Medical History:  Diagnosis Date  . Anemia   . Anxiety   . Bell's palsy   . Depression   . DVT (deep venous thrombosis) (HCC) 09/02/2012   [place] on 02/13/2012 showed acute DVT in left calf veins and posterior tibial veins  . Fibromyalgia   . GERD (gastroesophageal reflux disease)   . History of blood transfusion   . HTN (hypertension)   . Leukopenia   . MS (multiple sclerosis) (HCC) 1997  . Neuropathic pain   . Peripheral edema   . Port catheter in place 12/31/2012    PAST SURGICAL HISTORY: Past Surgical History:  Procedure Laterality Date  . ABDOMINAL HYSTERECTOMY    . CHOLECYSTECTOMY    . ESOPHAGOGASTRODUODENOSCOPY  2011   Dr. Jena Gauss. Possible cervical esophageal with status post disruption with passage of Maloney dilator, glycol esophageal erythema, antral erosions. Biopsy from the stomach revealed minimal chronic inactive inflammation but negative for H. pylori  . LAPAROSCOPIC GASTRIC SLEEVE RESECTION N/A 04/17/2016   Procedure:  LAPAROSCOPIC GASTRIC SLEEVE RESECTION WITH UPPER ENDOSCOPY;  Surgeon: Glenna Fellows, MD;  Location: [facility];  Service: General;  Laterality: N/A;  . TUBAL LIGATION  2001    FAMILY HISTORY: Family History  Problem Relation Age of Onset  . Heart disease Father   . Hyperlipidemia Father   . Congestive Heart Failure Father   . Hypertension Sister   . Other Paternal Uncle        Possible colon cancer, age greater than 75  . Cirrhosis Brother        etoh  . Healthy Mother     SOCIAL HISTORY:  Social History   Socioeconomic History  . Marital status: Married    Spouse name: Not on file  . Number of children: 2  . Years of education: some college  .  Highest education level: Not on file  Social Needs  . Financial resource strain: Not on file  . Food insecurity - worry: Not on file  . Food insecurity - inability: Not on file  . Transportation needs - medical: Not on file  . Transportation needs - non-medical: Not on file  Occupational History  . Occupation: disabled  Tobacco Use  . Smoking status: Former Smoker    Types: Cigarettes    Last attempt to quit: 1990    Years since quitting: 29.0  . Smokeless tobacco: Never Used  Substance and Sexual Activity  . Alcohol use: No  . Drug use: No  . Sexual activity: Not on file  Other Topics Concern  . Not on file  Social History Narrative   Lives at home with husband and 2 daughters   Right handed   Takes 1/2 caffeine pill per day     PHYSICAL EXAM   There were no vitals filed for this visit.  Not recorded      There is no height or weight on file to calculate BMI.  PHYSICAL EXAMNIATION:  Gen: NAD, conversant, well nourised, obese, well groomed                     Cardiovascular: Regular rate rhythm, no peripheral edema, warm, nontender. Eyes: Conjunctivae clear without exudates or hemorrhage Neck: Supple, no carotid bruits. Pulmonary: Clear to auscultation bilaterally   NEUROLOGICAL EXAM:  MENTAL  STATUS: Speech:    Speech is normal; fluent and spontaneous with normal comprehension.  Cognition:     Orientation to time, place and person     Normal recent and remote memory     Normal Attention span and concentration     Normal Language, naming, repeating,spontaneous speech     Fund of knowledge   CRANIAL NERVES: CN II: Visual fields are full to confrontation. Fundoscopic exam is normal with sharp discs and no vascular changes. Pupils are round equal and briskly reactive to light. CN III, IV, VI: extraocular movement are normal. No ptosis. CN V: Facial sensation is intact to pinprick in all 3 divisions bilaterally. Corneal responses are intact.  CN VII: Face is symmetric with normal eye closure and smile. CN VIII: Hearing is normal to rubbing fingers CN IX, X: Palate elevates symmetrically. Phonation is normal. CN XI: Head turning and shoulder shrug are intact CN XII: Tongue is midline with normal movements and no atrophy.  MOTOR: There is no pronator drift of out-stretched arms. Muscle bulk and tone are normal. Muscle strength is normal.  REFLEXES: Reflexes are 2+ and symmetric at the biceps, triceps, knees, and ankles. Plantar responses are flexor.  SENSORY: Intact to light touch, pinprick, positional sensation and vibratory sensation are intact in fingers and toes.  COORDINATION: Rapid alternating movements and fine finger movements are intact. There is no dysmetria on finger-to-nose and heel-knee-shin.    GAIT/STANCE: She needs to push up to get up from seated position, mildly unsteady,  DIAGNOSTIC DATA (LABS, IMAGING, TESTING) - I reviewed patient records, labs, notes, testing and imaging myself where available.   ASSESSMENT AND PLAN  MELAYNA ROBARTS is a 47 y.o. female   Relapsing remitting multiple sclerosis Right lower extremity deep achy pain  Followed up by [facility] Dr. Harlen Labs  Is taking Gilenya  Most recent MRI in 2015 showed multiple foci of T2/FLAIR  hyperintensity signal in the white matter, no change compared to previous scan  MRI of lumbar spine in 2015  showed abnormal area of T2 hyperintensity in the right lower thoracic cord and conus, the conus terminate at L1, multilevel degenerative changes, no significant canal or foraminal narrowing  MRI of cervical spine showed mild degenerative changes, no significant canal narrowing  Right lower extremity deep achy pain is most likely due to right lower thoracic MS lesion, there is no evidence of right lower extremity vascular insufficiency, no evidence of right lower extremity neuropathy, right lumbosacral radiculopathy.  Her mood overall is stable, after discussed with patient, we decided to taper off Effexor, try Cymbalta 60 mg daily, keep gabapentin 3600 mg   Melinda Moore, M.D. Ph.D.  [facility] [address] [place], [place] [address] Ph: [phone] Fax: [phone]  CC: Babs Sciara, MD

## 2017-06-14 NOTE — Addendum Note (Signed)
Addended by: Levert Feinstein on: 06/14/2017 12:14 PM   Modules accepted: Orders

## 2017-06-14 NOTE — Patient Instructions (Addendum)
You are taking Effexor XR 75 mg 3 times a day now,  Take Effexor 1 tablet twice a day for 1 week  Then 1 tablet for 1 week,  Stop for 1 week  Then you can start Cymbalta, 1 tablet every morning

## 2017-06-14 NOTE — Procedures (Signed)
        Full Name: Melinda Moore Gender: Female MRN #: [id number] Date of Birth: [birth year]    Visit Date: 06/14/2017 11:16 Age: 47 Years 6 Months Old Examining Physician: Levert Feinstein, MD  Referring Physician: Terrace Arabia, MD History: 47 year old female, presented with right lower extremity paresthesia, she has history of multiple sclerosis, there is evidence of right lower thoracic lesion.  Summary of the tests:  Nerve conduction study: Right lower extremity motor and sensory responses were normal.  Electromyography: Selective needle examination of right lower extremity muscles and right lumbosacral paraspinals were normal.   Conclusion:  This is a normal study.  There is no electrodiagnostic evidence of right lower extremity neuropathy or right lumbosacral radiculopathy.   ------------------------------- Levert Feinstein, M.D.  [facility] [address] [place], [place] [address] Tel: [phone] Fax: [phone]        [facility]    Nerve / Sites Muscle Latency Ref. Amplitude Ref. Rel Amp Segments Distance Velocity Ref. Area    ms ms mV mV %  cm m/s m/s mVms  R Peroneal - EDB     Ankle EDB 5.9 ?6.5 4.3 ?2.0 100 Ankle - EDB 9   16.0     Fib head EDB 13.2  4.4  102 Fib head - Ankle 34 47 ?44 16.1     Pop fossa EDB 15.2  3.9  88.7 Pop fossa - Fib head 10 51 ?44 14.0         Pop fossa - Ankle      R Tibial - AH     Ankle AH 5.5 ?5.8 16.4 ?4.0 100 Ankle - AH 9   43.0     Pop fossa AH 14.4  13.9  84.6 Pop fossa - Ankle 38 43 ?41 40.8         SNC    Nerve / Sites Rec. Site Peak Lat Ref.  Amp Ref. Segments Distance    ms ms V V  cm  R Sural - Ankle (Calf)     Calf Ankle 3.6 ?4.4 8 ?6 Calf - Ankle 14  R Superficial peroneal - Ankle     Lat leg Ankle 3.9 ?4.4 7 ?6 Lat leg - Ankle 14         F  Wave    Nerve F Lat Ref.   ms ms  R Tibial - AH 52.1 ?56.0       EMG full       EMG Summary Table    Spontaneous MUAP Recruitment  Muscle IA Fib PSW Fasc Other Amp Dur. Poly  Pattern  R. Tibialis anterior Normal None None None _______ Normal Normal Normal Normal  R. Tibialis posterior Normal None None None _______ Normal Normal Normal Normal  R. Peroneus longus Normal None None None _______ Normal Normal Normal Normal  R. Abductor hallucis Normal None None None _______ Normal Normal Normal Normal  R. Vastus lateralis Normal None None None _______ Normal Normal Normal Normal  R. Gluteus medius Normal None None None _______ Normal Normal Normal Normal  R. Lumbar paraspinals (mid) Normal None None None _______ Normal Normal Normal Normal  R. Lumbar paraspinals (low) Normal None None None _______ Normal Normal Normal Normal

## 2017-06-25 ENCOUNTER — Ambulatory Visit (INDEPENDENT_AMBULATORY_CARE_PROVIDER_SITE_OTHER): Payer: 59 | Admitting: Family Medicine

## 2017-06-25 ENCOUNTER — Encounter: Payer: Self-pay | Admitting: Family Medicine

## 2017-06-25 VITALS — BP 110/76 | Ht 67.0 in | Wt 236.0 lb

## 2017-06-25 DIAGNOSIS — G35 Multiple sclerosis: Secondary | ICD-10-CM | POA: Diagnosis not present

## 2017-06-25 DIAGNOSIS — G894 Chronic pain syndrome: Secondary | ICD-10-CM

## 2017-06-25 DIAGNOSIS — Z79899 Other long term (current) drug therapy: Secondary | ICD-10-CM

## 2017-06-25 DIAGNOSIS — D649 Anemia, unspecified: Secondary | ICD-10-CM

## 2017-06-25 DIAGNOSIS — Z13220 Encounter for screening for lipoid disorders: Secondary | ICD-10-CM

## 2017-06-25 MED ORDER — OXYCODONE-ACETAMINOPHEN 10-325 MG PO TABS: 1.0000 | ORAL_TABLET | ORAL | 0 refills | Status: DC | PRN

## 2017-06-25 NOTE — Progress Notes (Signed)
Subjective:    Patient ID: Melinda Moore, female    DOB: 14-Mar-1971, 47 y.o.   MRN: [id number]  HPI This patient was seen today for chronic pain  The medication list was reviewed and updated.   -Compliance with medication: yes  - Number patient states they take daily: no more than 5 per day  -when was the last dose patient took? today  The patient was advised the importance of maintaining medication and not using illegal substances with these.  Refills needed: yes  The patient was educated that we can provide 3 monthly scripts for their medication, it is their responsibility to follow the instructions.  Side effects or complications from medications: none  Patient is aware that pain medications are meant to minimize the severity of the pain to allow their pain levels to improve to allow for better function. They are aware of that pain medications cannot totally remove their pain.  Due for UDT ( at least once per year) : done august 2018  Wants to discuss taking cymbalta in the place of the effexor.     Patient did see neurologist nerve conduction study did not show nerve damage they felt the pain was related to the MS she is already on gabapentin they are recommending Cymbalta in place of the Effexor I did talk with the patient how to transition gradually downward on the Effexor in order to go over to Cymbalta  She does use Demadex periodically in order to help with swelling she also takes medication that can affect liver and hemoglobin therefore we need to check lab work She keeps her pain medicine in a safe spot She uses Ritalin only occasionally to help with focus and energy related to MS deterioration  Review of Systems  Constitutional: Negative for activity change and appetite change.  HENT: Negative for congestion.   Respiratory: Negative for cough.   Cardiovascular: Negative for chest pain.  Gastrointestinal: Negative for abdominal pain and vomiting.  Skin: Negative for  color change.  Neurological: Negative for weakness.  Psychiatric/Behavioral: Negative for confusion.       Objective:   Physical Exam  Constitutional: She appears well-nourished. No distress.  HENT:  Head: Normocephalic.  Right Ear: External ear normal.  Left Ear: External ear normal.  Eyes: Right eye exhibits no discharge. Left eye exhibits no discharge.  Neck: No tracheal deviation present.  Cardiovascular: Normal rate, regular rhythm and normal heart sounds.  No murmur heard. Pulmonary/Chest: Effort normal and breath sounds normal. No respiratory distress. She has no wheezes. She has no rales.  Musculoskeletal: She exhibits no edema.  Lymphadenopathy:    She has no cervical adenopathy.  Neurological: She is alert.  Psychiatric: Her behavior is normal.  Vitals reviewed.   25 minutes was spent with the patient. Greater than half the time was spent in discussion and answering questions and counseling regarding the issues that the patient came in for today.       Assessment & Plan:  Patient should be able to transition over to Cymbalta hopefully this will help with some of her pain it is yet to be seen if it will help enough for her pain in order for her to be off medication her goal is to taper off of oxycodone this will be limited by her ability to tolerate the MS pain a few weeks her legs Nerve conduction studies did not show any nerve damage but neurologist felt it was more likely musculoskeletal pain related to the  MS She will try to gradually taper down on the pain medicine and give [place] an update in a few weeks in addition to this 1 prescription was given for 150 tablets  Hopefully we will be able to taper down on pain medication  The patient had lab work ordered several months back she never got it completed we reordered it because of the medicine she is on it is necessary to follow these

## 2017-06-25 NOTE — Patient Instructions (Signed)
effexor  Go from 3 to 2 daily for 1 week  Then go to 1 per day for 1 week  Then stop Effexor and start Cymbalta

## 2017-07-12 DIAGNOSIS — G35 Multiple sclerosis: Secondary | ICD-10-CM | POA: Diagnosis not present

## 2017-07-16 ENCOUNTER — Encounter: Payer: Self-pay | Admitting: Family Medicine

## 2017-07-22 MED ORDER — OXYCODONE-ACETAMINOPHEN 10-325 MG PO TABS: 1.0000 | ORAL_TABLET | ORAL | 0 refills | Status: DC | PRN

## 2017-07-22 NOTE — Telephone Encounter (Signed)
Please see previous message

## 2017-07-22 NOTE — Telephone Encounter (Signed)
Nurses-I did look at the drug registry.  She may have 2 additional prescriptions of 150 tablets of oxycodone 10/325.  One every 4 hours as needed pain maximum 5/day.  The dates for the prescription is August 12, 2017 and September 11, 2017.  The patient may pick these up.  She will need to do a follow-up visit in April.

## 2017-07-22 NOTE — Addendum Note (Signed)
Addended by: Margaretha Sheffield on: 07/22/2017 10:35 AM   Modules accepted: Orders

## 2017-07-30 ENCOUNTER — Other Ambulatory Visit: Payer: Self-pay | Admitting: *Deleted

## 2017-07-30 MED ORDER — POTASSIUM CHLORIDE CRYS ER 20 MEQ PO TBCR: 20.0000 meq | EXTENDED_RELEASE_TABLET | Freq: Two times a day (BID) | ORAL | 1 refills | Status: DC

## 2017-07-31 ENCOUNTER — Telehealth: Payer: Self-pay | Admitting: Neurology

## 2017-07-31 MED ORDER — VENLAFAXINE HCL ER 37.5 MG PO TB24: 37.5000 mg | ORAL_TABLET | Freq: Every day | ORAL | 12 refills | Status: DC

## 2017-07-31 NOTE — Addendum Note (Signed)
Addended by: Lilla Shook on: 07/31/2017 04:13 PM   Modules accepted: Orders

## 2017-07-31 NOTE — Telephone Encounter (Signed)
Spoke to patient - states duloxetine is helping her pain but not her anxiety. Per vo by Dr. Terrace Arabia, she can start venlafaxine ER 37.5mg , one tablet at bedtime.  She can continue duloxetine 60mg  in the morning.  If her anxiety continues, she will need to further discuss with her PCP or regular neurologist, Dr. Renne Crigler at [facility].  She was agreeable of this plan.

## 2017-07-31 NOTE — Telephone Encounter (Signed)
Pt is asking for a call back to discuss concerns about DULoxetine (CYMBALTA) 60 MG capsule

## 2017-07-31 NOTE — Addendum Note (Signed)
Addended by: Lindell Spar C on: 07/31/2017 04:07 PM   Modules accepted: Orders

## 2017-08-19 ENCOUNTER — Other Ambulatory Visit: Payer: Self-pay | Admitting: Family Medicine

## 2017-08-23 DIAGNOSIS — G35 Multiple sclerosis: Secondary | ICD-10-CM | POA: Diagnosis not present

## 2017-09-23 ENCOUNTER — Encounter: Payer: Self-pay | Admitting: Family Medicine

## 2017-09-23 ENCOUNTER — Ambulatory Visit (INDEPENDENT_AMBULATORY_CARE_PROVIDER_SITE_OTHER): Payer: 59 | Admitting: Family Medicine

## 2017-09-23 VITALS — BP 134/88 | Ht 67.0 in | Wt 235.0 lb

## 2017-09-23 DIAGNOSIS — I10 Essential (primary) hypertension: Secondary | ICD-10-CM

## 2017-09-23 DIAGNOSIS — G894 Chronic pain syndrome: Secondary | ICD-10-CM | POA: Diagnosis not present

## 2017-09-23 DIAGNOSIS — E7849 Other hyperlipidemia: Secondary | ICD-10-CM

## 2017-09-23 DIAGNOSIS — M5431 Sciatica, right side: Secondary | ICD-10-CM | POA: Diagnosis not present

## 2017-09-23 MED ORDER — OXYCODONE-ACETAMINOPHEN 10-325 MG PO TABS: 1.0000 | ORAL_TABLET | ORAL | 0 refills | Status: DC | PRN

## 2017-09-23 MED ORDER — BUSPIRONE HCL 5 MG PO TABS: ORAL_TABLET | ORAL | 3 refills | Status: DC

## 2017-09-23 NOTE — Progress Notes (Signed)
   Subjective:    Patient ID: Melinda Moore, female    DOB: 05/22/1971, 47 y.o.   MRN: [id number]  HPI This patient was seen today for chronic pain  The medication list was reviewed and updated.   -Compliance with medication: yes  - Number patient states they take daily: 4  -when was the last dose patient took? Today around noon  The patient was advised the importance of maintaining medication and not using illegal substances with these.  Here for refills and follow up  The patient was educated that we can provide 3 monthly scripts for their medication, it is their responsibility to follow the instructions.  Side effects or complications from medications: none  Patient is aware that pain medications are meant to minimize the severity of the pain to allow their pain levels to improve to allow for better function. They are aware of that pain medications cannot totally remove their pain.  Due for UDT ( at least once per year) :  Done December 20 2016    She states the pain medicine is not adequately helping her with her sciatic pain she is going to be seen a specialist for injections in the near future she denies high fever chills sweats she denies abuse of her medication   Review of Systems  Constitutional: Negative for activity change and appetite change.  HENT: Negative for congestion and rhinorrhea.   Respiratory: Negative for cough and shortness of breath.   Cardiovascular: Negative for chest pain and palpitations.  Gastrointestinal: Negative for abdominal pain, diarrhea and vomiting.  Skin: Negative for color change.  Neurological: Negative for weakness.  Psychiatric/Behavioral: Negative for confusion.   She does have a history of pedal edema also a history of hyperlipidemia check lab work await results    Objective:   Physical Exam  Constitutional: She appears well-developed and well-nourished. No distress.  HENT:  Head: Normocephalic and atraumatic.  Eyes: Right eye  exhibits no discharge. Left eye exhibits no discharge.  Neck: No tracheal deviation present.  Cardiovascular: Normal rate, regular rhythm and normal heart sounds.  No murmur heard. Pulmonary/Chest: Effort normal and breath sounds normal. No respiratory distress. She has no wheezes. She has no rales.  Musculoskeletal: She exhibits no edema.  Lymphadenopathy:    She has no cervical adenopathy.  Neurological: She is alert. She exhibits normal muscle tone.  Skin: Skin is warm and dry. No erythema.  Psychiatric: Her behavior is normal.  Vitals reviewed.         Assessment & Plan:  Severe neuropathic pain with sciatica-patient on maximum dose gabapentin Drug registry checked Patient responsibly taking medication We went ahead and wrote her prescriptions 3 prescriptions written she may take a maximum of 6/day  She will give [place] an update she will be seen specialist for an injection she will let us know if that is helping certainly if her pain is getting worse may need referral to pain management  Hyperlipidemia follow a healthy diet check lab work await results  Blood pressure good control check metabolic 7  Mild obesity patient is trying to lose weight follow-up 3 months

## 2017-10-07 ENCOUNTER — Encounter: Payer: Self-pay | Admitting: Family Medicine

## 2017-10-07 DIAGNOSIS — M545 Low back pain: Secondary | ICD-10-CM | POA: Diagnosis not present

## 2017-10-08 ENCOUNTER — Other Ambulatory Visit (HOSPITAL_COMMUNITY): Payer: Self-pay | Admitting: Neurological Surgery

## 2017-10-08 DIAGNOSIS — M545 Low back pain: Secondary | ICD-10-CM

## 2017-10-11 ENCOUNTER — Other Ambulatory Visit: Payer: Self-pay | Admitting: Family Medicine

## 2017-10-11 ENCOUNTER — Ambulatory Visit (HOSPITAL_COMMUNITY)
Admission: RE | Admit: 2017-10-11 | Discharge: 2017-10-11 | Disposition: A | Discharge status: Home / Self Care | Payer: 59 | Source: Ambulatory Visit | Attending: Neurological Surgery | Admitting: Neurological Surgery

## 2017-10-11 DIAGNOSIS — M4686 Other specified inflammatory spondylopathies, lumbar region: Secondary | ICD-10-CM | POA: Insufficient documentation

## 2017-10-11 DIAGNOSIS — M4687 Other specified inflammatory spondylopathies, lumbosacral region: Secondary | ICD-10-CM | POA: Insufficient documentation

## 2017-10-11 DIAGNOSIS — M545 Low back pain: Secondary | ICD-10-CM | POA: Diagnosis not present

## 2017-10-11 DIAGNOSIS — M5136 Other intervertebral disc degeneration, lumbar region: Secondary | ICD-10-CM | POA: Diagnosis not present

## 2017-10-15 DIAGNOSIS — M545 Low back pain: Secondary | ICD-10-CM | POA: Diagnosis not present

## 2017-10-28 DIAGNOSIS — M48062 Spinal stenosis, lumbar region with neurogenic claudication: Secondary | ICD-10-CM | POA: Diagnosis not present

## 2017-10-28 DIAGNOSIS — M79604 Pain in right leg: Secondary | ICD-10-CM | POA: Diagnosis not present

## 2017-10-28 DIAGNOSIS — M5416 Radiculopathy, lumbar region: Secondary | ICD-10-CM | POA: Diagnosis not present

## 2017-11-05 ENCOUNTER — Telehealth: Payer: Self-pay | Admitting: *Deleted

## 2017-11-05 NOTE — Telephone Encounter (Signed)
Incoming call from andy at [facility]. Pt turned in script for oxycodone on Friday. She was getting 6 per day but new script had max 5 per day but directions and quantity were the same. #180. I read last note and it had max 6 per day. Pharm only has one script.

## 2017-11-06 DIAGNOSIS — M48062 Spinal stenosis, lumbar region with neurogenic claudication: Secondary | ICD-10-CM | POA: Diagnosis not present

## 2017-11-06 DIAGNOSIS — M5416 Radiculopathy, lumbar region: Secondary | ICD-10-CM | POA: Diagnosis not present

## 2017-11-06 NOTE — Telephone Encounter (Signed)
There must of been a typographical error.  Under the agreement I have with her she can have 180 tablets, maximum of 6/day- if a new prescription is necessary please let us know we can easily print 1

## 2017-11-06 NOTE — Telephone Encounter (Signed)
Andy from [facility] calling to check on message.

## 2017-11-06 NOTE — Telephone Encounter (Signed)
Discussed with Melinda Moore at [facility] who stated he does not need a new script -just needed to clarify the comment. Melinda Moore stated he will fill the prescription as written.

## 2017-12-24 ENCOUNTER — Ambulatory Visit (INDEPENDENT_AMBULATORY_CARE_PROVIDER_SITE_OTHER): Payer: Medicare Other | Admitting: Family Medicine

## 2017-12-24 ENCOUNTER — Encounter: Payer: Self-pay | Admitting: Family Medicine

## 2017-12-24 VITALS — BP 112/74 | Ht 67.0 in | Wt 247.0 lb

## 2017-12-24 DIAGNOSIS — G894 Chronic pain syndrome: Secondary | ICD-10-CM

## 2017-12-24 DIAGNOSIS — R600 Localized edema: Secondary | ICD-10-CM

## 2017-12-24 DIAGNOSIS — G35 Multiple sclerosis: Secondary | ICD-10-CM | POA: Diagnosis not present

## 2017-12-24 DIAGNOSIS — R8290 Unspecified abnormal findings in urine: Secondary | ICD-10-CM | POA: Diagnosis not present

## 2017-12-24 DIAGNOSIS — E7849 Other hyperlipidemia: Secondary | ICD-10-CM | POA: Diagnosis not present

## 2017-12-24 DIAGNOSIS — Z79891 Long term (current) use of opiate analgesic: Secondary | ICD-10-CM | POA: Diagnosis not present

## 2017-12-24 DIAGNOSIS — Z114 Encounter for screening for human immunodeficiency virus [HIV]: Secondary | ICD-10-CM | POA: Diagnosis not present

## 2017-12-24 MED ORDER — METHYLPHENIDATE HCL 10 MG PO TABS: ORAL_TABLET | ORAL | 0 refills | Status: DC

## 2017-12-24 MED ORDER — OXYCODONE-ACETAMINOPHEN 10-325 MG PO TABS: 1.0000 | ORAL_TABLET | ORAL | 0 refills | Status: DC | PRN

## 2017-12-24 NOTE — Progress Notes (Signed)
Subjective:    Patient ID: Melinda Moore, female    DOB: [birth year], 47 y.o.   MRN: [id number]  HPI This patient was seen today for chronic pain. Takes for right lower leg pain. Seeing dr Murray Hodgkins for injections.   The medication list was reviewed and updated.   -Compliance with medication: takes no more than 6 per day  - Number patient states they take daily: no more than 6 per day  -when was the last dose patient took? today  The patient was advised the importance of maintaining medication and not using illegal substances with these.  Here for refills and follow up  The patient was educated that we can provide 3 monthly scripts for their medication, it is their responsibility to follow the instructions.  Side effects or complications from medications: none  Patient is aware that pain medications are meant to minimize the severity of the pain to allow their pain levels to improve to allow for better function. They are aware of that pain medications cannot totally remove their pain.  Due for UDT ( at least once per year) : due today  Patient has chronic swelling of the lower legs probably related to gabapentin as well as pain medication she has not had any episodes of failure this is been going on for quite some time she is on medication she does need to have lab work she is not followed through doing the lab work lately so therefore it needs to get done  Patient uses Ritalin 1 today to help her with activity level and focus she had significant fatigue tiredness related to her MS and as a result she needs Ritalin in order to help her she does not abuse it is been going on for quite some time drug registry was checked.  Mass is being treated she states is been stable she takes gabapentin for this we do manage medications to help her with this although the specialist manages her MS medicine  Patient has morbid obesity with her BMI is back today with her risk factors she does try to watch her  diet she does try to stay as active as her MS allows  She does have a history of slight elevation of cholesterol but she does try to watch how she eats that this been going on for quite some time not on medicine      Review of Systems  Constitutional: Negative for activity change and appetite change.  HENT: Negative for congestion and rhinorrhea.   Respiratory: Negative for cough and shortness of breath.   Cardiovascular: Negative for chest pain and leg swelling.  Gastrointestinal: Negative for abdominal pain and vomiting.  Musculoskeletal: Positive for arthralgias and back pain.  Skin: Negative for color change.  Neurological: Negative for dizziness and weakness.  Psychiatric/Behavioral: Negative for behavioral problems and confusion.       Objective:   Physical Exam  Constitutional: She appears well-nourished. No distress.  HENT:  Head: Normocephalic and atraumatic.  Eyes: Right eye exhibits no discharge. Left eye exhibits no discharge.  Cardiovascular: Normal rate, regular rhythm and normal heart sounds.  No murmur heard. Pulmonary/Chest: Effort normal and breath sounds normal. No respiratory distress.  Musculoskeletal: She exhibits no edema.  Lymphadenopathy:    She has no cervical adenopathy.  Neurological: She is alert. She exhibits normal muscle tone.  Psychiatric: She has a normal mood and affect. Her behavior is normal.  Vitals reviewed.         Assessment &  Plan:  The patient was seen in followup for chronic pain. A review over at their current pain status was discussed. Drug registry was checked. Prescriptions were given. Discussion was held regarding the importance of compliance with medication as well as pain medication contract.  Time for questions regarding pain management plan occurred. Importance of regular followup visits was discussed. Patient was informed that medication may cause drowsiness and should not be combined  with other  medications/alcohol or street drugs. Patient was cautioned that medication could cause drowsiness. If the patient feels medication is causing altered alertness then do not drive or operate dangerous equipment.  Patient has history of hyperlipidemia controlled by diet we will check lipid profile previous labs were reviewed with patient  Pedal edema use diuretic on a regular basis continue this   uses with Ritalin 3 prescriptions given see documentation above    Intermittent bad odor of urine patient will bring [place] urine back for resubmission and evaluation  25 minutes was spent with the patient.  This statement verifies that 25 minutes was indeed spent with the patient.  More than 50% of this visit-total duration of the visit-was spent in counseling and coordination of care. The issues that the patient came in for today as reflected in the diagnosis (s) please refer to documentation for further details.  History of anemia previous labs was checked check CBC await the results

## 2017-12-25 DIAGNOSIS — M5416 Radiculopathy, lumbar region: Secondary | ICD-10-CM | POA: Diagnosis not present

## 2017-12-25 DIAGNOSIS — M48062 Spinal stenosis, lumbar region with neurogenic claudication: Secondary | ICD-10-CM | POA: Diagnosis not present

## 2017-12-28 LAB — TOXASSURE SELECT 13 (MW), URINE

## 2017-12-28 LAB — SPECIMEN STATUS REPORT

## 2018-01-03 MED ORDER — OXYCODONE-ACETAMINOPHEN 10-325 MG PO TABS: 1.0000 | ORAL_TABLET | ORAL | 0 refills | Status: DC | PRN

## 2018-01-03 NOTE — Addendum Note (Signed)
Addended by: Margaretha Sheffield on: 01/03/2018 11:29 AM   Modules accepted: Orders

## 2018-01-15 DIAGNOSIS — M48062 Spinal stenosis, lumbar region with neurogenic claudication: Secondary | ICD-10-CM | POA: Diagnosis not present

## 2018-01-23 ENCOUNTER — Other Ambulatory Visit: Payer: Self-pay | Admitting: Family Medicine

## 2018-01-24 DIAGNOSIS — R600 Localized edema: Secondary | ICD-10-CM | POA: Diagnosis not present

## 2018-01-24 DIAGNOSIS — G894 Chronic pain syndrome: Secondary | ICD-10-CM | POA: Diagnosis not present

## 2018-01-24 DIAGNOSIS — E7849 Other hyperlipidemia: Secondary | ICD-10-CM | POA: Diagnosis not present

## 2018-01-25 ENCOUNTER — Encounter: Payer: Self-pay | Admitting: Family Medicine

## 2018-01-25 LAB — CBC WITH DIFFERENTIAL/PLATELET
BASOS ABS: 0.1 10*3/uL (ref 0.0–0.2)
Basos: 2 %
EOS (ABSOLUTE): 0.3 10*3/uL (ref 0.0–0.4)
Eos: 6 %
HEMOGLOBIN: 12.1 g/dL (ref 11.1–15.9)
Hematocrit: 37.8 % (ref 34.0–46.6)
Immature Grans (Abs): 0 10*3/uL (ref 0.0–0.1)
Immature Granulocytes: 0 %
LYMPHS ABS: 1.6 10*3/uL (ref 0.7–3.1)
Lymphs: 34 %
MCH: 27.4 pg (ref 26.6–33.0)
MCHC: 32 g/dL (ref 31.5–35.7)
MCV: 86 fL (ref 79–97)
MONOCYTES: 9 %
MONOS ABS: 0.4 10*3/uL (ref 0.1–0.9)
Neutrophils Absolute: 2.3 10*3/uL (ref 1.4–7.0)
Neutrophils: 49 %
PLATELETS: 364 10*3/uL (ref 150–450)
RBC: 4.41 x10E6/uL (ref 3.77–5.28)
RDW: 15 % (ref 12.3–15.4)
WBC: 4.7 10*3/uL (ref 3.4–10.8)

## 2018-01-25 LAB — BASIC METABOLIC PANEL
BUN / CREAT RATIO: 12 (ref 9–23)
BUN: 11 mg/dL (ref 6–24)
CHLORIDE: 98 mmol/L (ref 96–106)
CO2: 27 mmol/L (ref 20–29)
Calcium: 9.2 mg/dL (ref 8.7–10.2)
Creatinine, Ser: 0.95 mg/dL (ref 0.57–1.00)
GFR calc Af Amer: 82 mL/min/{1.73_m2} (ref >59)
GFR calc non Af Amer: 72 mL/min/{1.73_m2} (ref >59)
GLUCOSE: 93 mg/dL (ref 65–99)
Potassium: 4.5 mmol/L (ref 3.5–5.2)
Sodium: 139 mmol/L (ref 134–144)

## 2018-01-25 LAB — LIPID PANEL
CHOL/HDL RATIO: 3.2 ratio (ref 0.0–4.4)
Cholesterol, Total: 200 mg/dL — ABNORMAL HIGH (ref 100–199)
HDL: 62 mg/dL (ref >39)
LDL Calculated: 114 mg/dL — ABNORMAL HIGH (ref 0–99)
Triglycerides: 122 mg/dL (ref 0–149)
VLDL Cholesterol Cal: 24 mg/dL (ref 5–40)

## 2018-01-25 LAB — HIV ANTIBODY (ROUTINE TESTING W REFLEX): HIV Screen 4th Generation wRfx: NONREACTIVE

## 2018-01-25 LAB — HEPATIC FUNCTION PANEL
ALBUMIN: 4.3 g/dL (ref 3.5–5.5)
ALT: 14 IU/L (ref 0–32)
AST: 11 IU/L (ref 0–40)
Alkaline Phosphatase: 98 IU/L (ref 39–117)
Bilirubin Total: 0.4 mg/dL (ref 0.0–1.2)
Bilirubin, Direct: 0.1 mg/dL (ref 0.00–0.40)
TOTAL PROTEIN: 7 g/dL (ref 6.0–8.5)

## 2018-02-04 DIAGNOSIS — M5416 Radiculopathy, lumbar region: Secondary | ICD-10-CM | POA: Diagnosis not present

## 2018-02-04 DIAGNOSIS — M48062 Spinal stenosis, lumbar region with neurogenic claudication: Secondary | ICD-10-CM | POA: Diagnosis not present

## 2018-02-05 ENCOUNTER — Other Ambulatory Visit: Payer: Self-pay | Admitting: Family Medicine

## 2018-02-18 ENCOUNTER — Telehealth: Payer: Self-pay | Admitting: Family Medicine

## 2018-02-18 ENCOUNTER — Other Ambulatory Visit: Payer: Self-pay | Admitting: Family Medicine

## 2018-02-18 ENCOUNTER — Encounter (HOSPITAL_COMMUNITY): Payer: Self-pay | Admitting: Physical Therapy

## 2018-02-18 DIAGNOSIS — R8290 Unspecified abnormal findings in urine: Secondary | ICD-10-CM | POA: Diagnosis not present

## 2018-02-18 NOTE — Telephone Encounter (Signed)
Pt dropped off urine this morning. Sent urine for culture due to bad order per office note in July.

## 2018-02-18 NOTE — Telephone Encounter (Signed)
I agree, send for culture, await results

## 2018-02-18 NOTE — Therapy (Signed)
[facility] [place], [place], [address] Phone: [phone]   Fax:  [phone]  Patient Details  Name: Melinda Moore MRN: [id number] Date of Birth: 1971/05/18 Referring Provider:  No ref. provider found  Encounter Date: 02/18/2018  PHYSICAL THERAPY DISCHARGE SUMMARY  Visits from Start of Care: 2  Current functional level related to goals / functional outcomes:  DC, has not returned to PT    Remaining deficits: Unable to assess    Education / Equipment: None  Plan: Patient agrees to discharge.  Patient goals were not met. Patient is being discharged due to not returning since the last visit.  ?????      Deniece Ree PT, DPT, CBIS  Supplemental Physical Therapist [facility]    Pager [phone] Acute Rehab Office [facility] [address] [place], [place], [address] Phone: [phone]   Fax:  [phone]

## 2018-02-20 LAB — URINE CULTURE

## 2018-02-20 LAB — SPECIMEN STATUS REPORT

## 2018-02-21 MED ORDER — CIPROFLOXACIN HCL 500 MG PO TABS: 500.0000 mg | ORAL_TABLET | Freq: Two times a day (BID) | ORAL | 0 refills | Status: AC

## 2018-02-21 NOTE — Progress Notes (Signed)
cipro

## 2018-03-05 DIAGNOSIS — M5416 Radiculopathy, lumbar region: Secondary | ICD-10-CM | POA: Diagnosis not present

## 2018-03-05 DIAGNOSIS — M48062 Spinal stenosis, lumbar region with neurogenic claudication: Secondary | ICD-10-CM | POA: Diagnosis not present

## 2018-03-07 DIAGNOSIS — G35 Multiple sclerosis: Secondary | ICD-10-CM | POA: Diagnosis not present

## 2018-03-07 DIAGNOSIS — Z885 Allergy status to narcotic agent status: Secondary | ICD-10-CM | POA: Diagnosis not present

## 2018-03-17 ENCOUNTER — Encounter: Payer: Self-pay | Admitting: Family Medicine

## 2018-03-17 ENCOUNTER — Ambulatory Visit (INDEPENDENT_AMBULATORY_CARE_PROVIDER_SITE_OTHER): Payer: Medicare Other | Admitting: Family Medicine

## 2018-03-17 VITALS — BP 128/80 | Ht 67.0 in | Wt 246.0 lb

## 2018-03-17 DIAGNOSIS — G35 Multiple sclerosis: Secondary | ICD-10-CM | POA: Diagnosis not present

## 2018-03-17 DIAGNOSIS — Z23 Encounter for immunization: Secondary | ICD-10-CM

## 2018-03-17 DIAGNOSIS — M5431 Sciatica, right side: Secondary | ICD-10-CM

## 2018-03-17 MED ORDER — BUPROPION HCL ER (SR) 150 MG PO TB12: 150.0000 mg | ORAL_TABLET | Freq: Two times a day (BID) | ORAL | 5 refills | Status: DC

## 2018-03-17 NOTE — Progress Notes (Signed)
Subjective:    Patient ID: Melinda Moore, female    DOB: Jan 25, 1971, 47 y.o.   MRN: [id number]  HPI This patient was seen today for chronic pain. Has pain in right knee and right lower leg.  The medication list was reviewed and updated.   -Compliance with medication: takes no more than 6 per day  - Number patient states they take daily: no more than 6 per day  -when was the last dose patient took? today  The patient was advised the importance of maintaining medication and not using illegal substances with these.  Here for refills and follow up  The patient was educated that we can provide 3 monthly scripts for their medication, it is their responsibility to follow the instructions.  Side effects or complications from medications: none  Patient is aware that pain medications are meant to minimize the severity of the pain to allow their pain levels to improve to allow for better function. They are aware of that pain medications cannot totally remove their pain.  Due for UDT ( at least once per year) : last one July 2019  Would like to discuss changing buspar. Not helping much.   Flu vaccine today.    Patient is interested in trying Wellbutrin she states that seems to be better for her than the BuSpar we will make an accommodation for this today  Patient also takes her ADD medicine to help her with energy level overall because she has such a difficult time because of her MS it is what helped her in the past she needs refills    Review of Systems  Constitutional: Negative for activity change, appetite change and fatigue.  HENT: Negative for congestion and rhinorrhea.   Respiratory: Negative for cough and shortness of breath.   Cardiovascular: Negative for chest pain and leg swelling.  Gastrointestinal: Negative for abdominal pain and diarrhea.  Endocrine: Negative for polydipsia and polyphagia.  Skin: Negative for color change.  Neurological: Negative for dizziness and  weakness.  Psychiatric/Behavioral: Negative for behavioral problems and confusion.       Objective:   Physical Exam  Constitutional: She appears well-nourished. No distress.  HENT:  Head: Normocephalic and atraumatic.  Eyes: Right eye exhibits no discharge. Left eye exhibits no discharge.  Neck: No tracheal deviation present.  Cardiovascular: Normal rate, regular rhythm and normal heart sounds.  No murmur heard. Pulmonary/Chest: Effort normal and breath sounds normal. No respiratory distress.  Musculoskeletal: She exhibits no edema.  Lymphadenopathy:    She has no cervical adenopathy.  Neurological: She is alert. Coordination normal.  Skin: Skin is warm and dry.  Psychiatric: She has a normal mood and affect. Her behavior is normal.  Vitals reviewed.         Assessment & Plan:  The patient was seen in followup for chronic pain. A review over at their current pain status was discussed. Drug registry was checked. Prescriptions were given. Discussion was held regarding the importance of compliance with medication as well as pain medication contract.  Time for questions regarding pain management plan occurred. Importance of regular followup visits was discussed. Patient was informed that medication may cause drowsiness and should not be combined  with other medications/alcohol or street drugs. Patient was cautioned that medication could cause drowsiness. If the patient feels medication is causing altered alertness then do not drive or operate dangerous equipment.  We will send in a prescription electronically  Ritalin she is compliant with medicine we will send  in prescription electronically

## 2018-03-18 MED ORDER — OXYCODONE-ACETAMINOPHEN 10-325 MG PO TABS: 1.0000 | ORAL_TABLET | ORAL | 0 refills | Status: AC | PRN

## 2018-03-18 MED ORDER — METHYLPHENIDATE HCL 10 MG PO TABS: 10.0000 mg | ORAL_TABLET | Freq: Two times a day (BID) | ORAL | 0 refills | Status: DC

## 2018-03-18 MED ORDER — OXYCODONE-ACETAMINOPHEN 10-325 MG PO TABS: 1.0000 | ORAL_TABLET | ORAL | 0 refills | Status: DC | PRN

## 2018-03-18 MED ORDER — METHYLPHENIDATE HCL 10 MG PO TABS: ORAL_TABLET | ORAL | 0 refills | Status: DC

## 2018-03-19 ENCOUNTER — Telehealth: Payer: Self-pay | Admitting: Family Medicine

## 2018-03-19 NOTE — Telephone Encounter (Signed)
Pt returned call, no nurse available.

## 2018-03-19 NOTE — Telephone Encounter (Signed)
I am not opposed to her being on Voltaren gel  #1 need to know where she is going to use this? Typically if it is approved it can be used up to 4 times a day  99.99% of the time insurance companies will NOT cover brand name when the generic is available so therefore the patient would have to pay for that out-of-pocket  If she is interested in having the Voltaren gel sent in we can do so but if her policy calls for generic mood that is what the insurance company will dictate thank you

## 2018-03-19 NOTE — Telephone Encounter (Signed)
Left message to return call 

## 2018-03-19 NOTE — Telephone Encounter (Signed)
Please advise 

## 2018-03-19 NOTE — Telephone Encounter (Signed)
Patient stated she will wait and speak with her orthopedic doctor about the medication and go from there

## 2018-03-19 NOTE — Telephone Encounter (Signed)
Pt was seen Monday, pt states it was a discussion about starting Voltaren gel not the generic brand, pt requesting name brand. Advise.  [facility] [address]. [place], [place]

## 2018-03-23 ENCOUNTER — Telehealth: Payer: Self-pay | Admitting: Family Medicine

## 2018-03-23 NOTE — Telephone Encounter (Signed)
Please let the patient be aware that I did review the notes that were sent to me from Dr. Murray Hodgkins regarding spinal injections  The last time the patient was here I told her that I would review over the notes from Dr. Murray Hodgkins  Other than doing injections I do not see where they are proposing any other procedures for her sciatica. For now I recommend she continue her medications and if she is desiring additional treatments for her sciatica I would recommend she initiate this discussion with Dr. Murray Hodgkins and if she feels that this is not getting anywhere to let us know what she is finding out/or what her particular questions are at that time

## 2018-03-24 NOTE — Telephone Encounter (Signed)
Discussed with pt. Pt verbalized understanding.  °

## 2018-03-24 NOTE — Telephone Encounter (Signed)
Left message to return call 

## 2018-03-26 ENCOUNTER — Encounter (INDEPENDENT_AMBULATORY_CARE_PROVIDER_SITE_OTHER): Payer: Self-pay | Admitting: Orthopaedic Surgery

## 2018-03-26 ENCOUNTER — Ambulatory Visit (INDEPENDENT_AMBULATORY_CARE_PROVIDER_SITE_OTHER): Payer: Self-pay

## 2018-03-26 ENCOUNTER — Ambulatory Visit: Payer: Medicare Other | Admitting: Family Medicine

## 2018-03-26 ENCOUNTER — Ambulatory Visit (INDEPENDENT_AMBULATORY_CARE_PROVIDER_SITE_OTHER): Payer: Medicare Other | Admitting: Orthopaedic Surgery

## 2018-03-26 VITALS — BP 93/45 | HR 103 | Ht 69.0 in | Wt 239.0 lb

## 2018-03-26 DIAGNOSIS — M25561 Pain in right knee: Secondary | ICD-10-CM

## 2018-03-26 DIAGNOSIS — G8929 Other chronic pain: Secondary | ICD-10-CM

## 2018-03-26 MED ORDER — LIDOCAINE HCL 1 % IJ SOLN
2.0000 mL | INTRAMUSCULAR | Status: AC | PRN
  Administered 2018-03-26: 2 mL

## 2018-03-26 MED ORDER — METHYLPREDNISOLONE ACETATE 40 MG/ML IJ SUSP
80.0000 mg | INTRAMUSCULAR | Status: AC | PRN
  Administered 2018-03-26: 80 mg

## 2018-03-26 MED ORDER — BUPIVACAINE HCL 0.5 % IJ SOLN
2.0000 mL | INTRAMUSCULAR | Status: AC | PRN
  Administered 2018-03-26: 2 mL via INTRA_ARTICULAR

## 2018-03-26 NOTE — Progress Notes (Signed)
Office Visit Note   Patient: Melinda Moore           Date of Birth: 1971/04/12           MRN: [id number] Visit Date: 03/26/2018              Requested by: Babs Sciara, MD [address] [place], [place] [address] PCP: Babs Sciara, MD   Assessment & Plan: Visit Diagnoses:  1. Chronic pain of right knee     Plan: Arthritis right knee and all 3 compartments.  Will inject with cortisone and monitor response.  Had a similar cortisone injection several years ago in [place] that "helped a lot".  If no improvement would consider an MRI scan.  Office 3 to 4 weeks if no improvement  Follow-Up Instructions: Return if symptoms worsen or fail to improve.   Orders:  Orders Placed This Encounter  Procedures  . Large Joint Inj: R knee  . XR KNEE 3 VIEW RIGHT   No orders of the defined types were placed in this encounter.     Procedures: Large Joint Inj: R knee on 03/26/2018 2:14 PM Indications: pain and diagnostic evaluation Details: 25 G 1.5 in needle, anteromedial approach  Arthrogram: No  Medications: 2 mL lidocaine 1 %; 2 mL bupivacaine 0.5 %; 80 mg methylPREDNISolone acetate 40 MG/ML Procedure, treatment alternatives, risks and benefits explained, specific risks discussed. Consent was given by the patient. Immediately prior to procedure a time out was called to verify the correct patient, procedure, equipment, support staff and site/side marked as required. Patient was prepped and draped in the usual sterile fashion.       Clinical Data: No additional findings.   Subjective: Chief Complaint  Patient presents with  . New Patient (Initial Visit)    R KNEE PAIN FOR 1 YR GETTING WORSE, NO INJURY SEEING DR BARCO FOR BACK ISSUES BUT STILL NOT BETTER AND REFERRED HERE FOR CORTISONE INJECTION TO SEE IF THAT WILL HELP R KNEE  Recently experienced onset of right knee pain without injury or trauma.  She has had some trouble for "a while".  She had seen Dr.Deveshwar  Breaux myalgia and knee pain a year or 2 ago.  Cortisone injection "made a big difference".  She is had insidious recurrence of her pain recently.  She does have a history of MS and has had some recent numbness and tingling in her right leg.  She sees her neurologist at [facility] who thought that the lower extremity discomfort was consistent with her MS but not necessarily her knee.  She is here today for further evaluation.  HPI  Review of Systems  Constitutional: Negative for fatigue and fever.  HENT: Negative for ear pain.   Eyes: Negative for pain.  Respiratory: Negative for cough and shortness of breath.   Gastrointestinal: Negative for constipation and diarrhea.  Genitourinary: Negative for difficulty urinating.  Musculoskeletal: Negative for back pain and neck pain.  Skin: Negative for rash.  Allergic/Immunologic: Negative for food allergies.  Neurological: Positive for weakness. Negative for numbness.  Hematological: Bruises/bleeds easily.  Psychiatric/Behavioral: Positive for sleep disturbance.     Objective: Vital Signs: BP (!) 93/45 (BP Location: Left Arm, Patient Position: Sitting, Cuff Size: Normal)   Pulse (!) 103   Ht 5\' 9"  (1.753 m)   Wt 239 lb (108.4 kg)   BMI 35.29 kg/m   Physical Exam  Constitutional: She is oriented to person, place, and time. She appears well-developed  and well-nourished.  HENT:  Mouth/Throat: Oropharynx is clear and moist.  Eyes: Pupils are equal, round, and reactive to light. EOM are normal.  Pulmonary/Chest: Effort normal.  Neurological: She is alert and oriented to person, place, and time.  Skin: Skin is warm and dry.  Psychiatric: She has a normal mood and affect. Her behavior is normal.    Ortho Exam straight leg raise negative bilaterally.  Very minimal effusion right knee.  Mild medial joint pain diffusely and some pain over the medial femoral condyle.  Very little discomfort laterally.  Full extension.  Flexion over 100  degrees without instability.  No popliteal pain.  No calf pain.  No distal edema.  Mild grating with patella motion but no particular pain click or popping along the medial or lateral joint Specialty Comments:  No specialty comments available.  Imaging: Xr Knee 3 View Right  Result Date: 03/26/2018 Films of the right knee repayment several projections standing.  There is normal alignment.  There is definite decrease in the medial lateral joint spaces associated with arthritis.  There are peripheral osteophytes in both medial lateral compartment as well as the patellofemoral joint.  Neurosis more in the lateral femoral condyle and medially but patient is more symptomatic medially.  No obvious evidence of CPPD.  Films are consistent with osteoarthritis    PMFS History: Patient Active Problem List   Diagnosis Date Noted  . Right leg pain 03/12/2017  . Gait abnormality 03/12/2017  . Radiculopathy due to lumbar intervertebral disc disorder 09/21/2016  . Spondylosis without myelopathy or radiculopathy, lumbar region 09/21/2016  . History of anxiety 09/08/2016  . Primary osteoarthritis of right knee 08/30/2016  . Primary insomnia 08/30/2016  . Myofascial pain 08/30/2016  . Sciatica, right side 12/12/2015  . Right knee pain 12/12/2015  . Morbid obesity (HCC) 08/12/2015  . Lumbago 02/10/2014  . Chronic anxiety 07/08/2013  . Swelling of breast 04/08/2013  . Chronic pain syndrome 03/11/2013  . Port catheter in place 12/31/2012  . Unspecified constipation 10/01/2012  . Anemia 10/01/2012  . Rectal bleeding 10/01/2012  . Elevated alkaline phosphatase level 10/01/2012  . MS (multiple sclerosis) (HCC) 09/02/2012  . DVT (deep venous thrombosis) (HCC) 09/02/2012  . ANEMIA 04/12/2010  . NAUSEA WITH VOMITING 04/12/2010  . OTHER DYSPHAGIA 04/12/2010   Past Medical History:  Diagnosis Date  . Anemia   . Anxiety   . Bell's palsy   . Depression   . DVT (deep venous thrombosis) (HCC) 09/02/2012    [place] on 02/13/2012 showed acute DVT in left calf veins and posterior tibial veins  . Fibromyalgia   . GERD (gastroesophageal reflux disease)   . History of blood transfusion   . HTN (hypertension)   . Leukopenia   . MS (multiple sclerosis) (HCC) 1997  . Neuropathic pain   . Peripheral edema   . Port catheter in place 12/31/2012    Family History  Problem Relation Age of Onset  . Heart disease Father   . Hyperlipidemia Father   . Congestive Heart Failure Father   . Hypertension Sister   . Other Paternal Uncle        Possible colon cancer, age greater than 74  . Cirrhosis Brother        etoh  . Healthy Mother     Past Surgical History:  Procedure Laterality Date  . ABDOMINAL HYSTERECTOMY    . CHOLECYSTECTOMY    . ESOPHAGOGASTRODUODENOSCOPY  2011   Dr. Jena Gauss. 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 Biopsy from the stomach revealed minimal chronic inactive inflammation but negative for H. pylori  . LAPAROSCOPIC GASTRIC SLEEVE RESECTION N/A 04/17/2016   Procedure: LAPAROSCOPIC GASTRIC SLEEVE RESECTION WITH UPPER ENDOSCOPY;  Surgeon: Glenna Fellows, MD;  Location: [facility];  Service: General;  Laterality: N/A;  . TUBAL LIGATION  2001   Social History   Occupational History  . Occupation: disabled  Tobacco Use  . Smoking status: Former Smoker    Types: Cigarettes    Last attempt to quit: 1990    Years since quitting: 29.8  . Smokeless tobacco: Never Used  Substance and Sexual Activity  . Alcohol use: No  . Drug use: No  . Sexual activity: Not on file

## 2018-03-27 ENCOUNTER — Telehealth (INDEPENDENT_AMBULATORY_CARE_PROVIDER_SITE_OTHER): Payer: Self-pay | Admitting: Orthopaedic Surgery

## 2018-03-27 NOTE — Telephone Encounter (Signed)
NOTIFIED PHARMACY

## 2018-03-27 NOTE — Telephone Encounter (Signed)
Pharmacy needs clarification on directions, how much appropriately is patient to apply each dose, and tube size for Diclofenac Sodium. Please call to advise.

## 2018-04-07 NOTE — Progress Notes (Signed)
Office Visit Note  Patient: Melinda Moore             Date of Birth: [birth year]           MRN: [id number]             PCP: Babs Sciara, MD Referring: Babs Sciara, MD Visit Date: 04/11/2018 Occupation: @GUAROCC @  Subjective:  Right knee pain.   History of Present Illness: Melinda Moore is a 47 y.o. female with history of myofascial pain, osteoarthritis and degenerative disc disease of lumbar spine.  She states she continues to have pain and discomfort in her right knee joint.  She was seen by Dr. Cleophas Dunker few weeks ago and had x-ray of her right knee joint which was diagnosed with the osteoarthritis.  She also had a cortisone injection.  She states the injection lasted for only 3 hours and the symptoms recurred.  She has had constant pain in her right knee joint.  She has been also having lower back pain for which she has been seeing Dr. Murray Hodgkins and had some cortisone injections.  None of the other joints are painful.  Activities of Daily Living:  Patient reports morning stiffness for several hours.   Patient Reports nocturnal pain.  Difficulty dressing/grooming: Denies Difficulty climbing stairs: Denies Difficulty getting out of chair: Denies Difficulty using hands for taps, buttons, cutlery, and/or writing: Denies  Review of Systems  Constitutional: Positive for fatigue.  HENT: Negative for mouth sores, trouble swallowing, trouble swallowing and mouth dryness.   Eyes: Positive for dryness. Negative for pain, redness and itching.  Respiratory: Negative for shortness of breath, wheezing and difficulty breathing.   Cardiovascular: Negative for chest pain, palpitations and swelling in legs/feet.  Gastrointestinal: Negative for abdominal pain, constipation, diarrhea, nausea and vomiting.  Endocrine: Negative for increased urination.  Genitourinary: Negative for painful urination, nocturia and pelvic pain.  Musculoskeletal: Positive for arthralgias, joint pain and morning stiffness.  Negative for joint swelling.  Skin: Negative for rash and hair loss.  Allergic/Immunologic: Negative for susceptible to infections.  Neurological: Positive for weakness. Negative for dizziness, light-headedness, headaches and memory loss.  Hematological: Positive for bruising/bleeding tendency.  Psychiatric/Behavioral: Negative for confusion. The patient is not nervous/anxious.     PMFS History:  Patient Active Problem List   Diagnosis Date Noted  . Right leg pain 03/12/2017  . Gait abnormality 03/12/2017  . Radiculopathy due to lumbar intervertebral disc disorder 09/21/2016  . Spondylosis without myelopathy or radiculopathy, lumbar region 09/21/2016  . History of anxiety 09/08/2016  . Primary osteoarthritis of right knee 08/30/2016  . Primary insomnia 08/30/2016  . Myofascial pain 08/30/2016  . Sciatica, right side 12/12/2015  . Right knee pain 12/12/2015  . Morbid obesity (HCC) 08/12/2015  . Lumbago 02/10/2014  . Chronic anxiety 07/08/2013  . Swelling of breast 04/08/2013  . Chronic pain syndrome 03/11/2013  . Port catheter in place 12/31/2012  . Unspecified constipation 10/01/2012  . Anemia 10/01/2012  . Rectal bleeding 10/01/2012  . Elevated alkaline phosphatase level 10/01/2012  . MS (multiple sclerosis) (HCC) 09/02/2012  . DVT (deep venous thrombosis) (HCC) 09/02/2012  . ANEMIA 04/12/2010  . NAUSEA WITH VOMITING 04/12/2010  . OTHER DYSPHAGIA 04/12/2010    Past Medical History:  Diagnosis Date  . Anemia   . Anxiety   . Bell's palsy   . Depression   . DVT (deep venous thrombosis) (HCC) 09/02/2012   [place] on 02/13/2012 showed acute DVT in left calf veins and posterior  tibial veins  . Fibromyalgia   . GERD (gastroesophageal reflux disease)   . History of blood transfusion   . HTN (hypertension)   . Leukopenia   . MS (multiple sclerosis) (HCC) 1997  . Neuropathic pain   . Peripheral edema   . Port catheter in place 12/31/2012    Family History  Problem Relation Age  of Onset  . Heart disease Father   . Hyperlipidemia Father   . Congestive Heart Failure Father   . Hypertension Sister   . Other Paternal Uncle        Possible colon cancer, age greater than 27  . Cirrhosis Brother        etoh  . Healthy Mother   . Healthy Daughter   . Healthy Daughter    Past Surgical History:  Procedure Laterality Date  . ABDOMINAL HYSTERECTOMY    . CHOLECYSTECTOMY    . ESOPHAGOGASTRODUODENOSCOPY  2011   Dr. Jena Gauss. Possible cervical esophageal with status post disruption with passage of Maloney dilator, glycol esophageal erythema, antral erosions. Biopsy from the stomach revealed minimal chronic inactive inflammation but negative for H. pylori  . LAPAROSCOPIC GASTRIC SLEEVE RESECTION N/A 04/17/2016   Procedure: LAPAROSCOPIC GASTRIC SLEEVE RESECTION WITH UPPER ENDOSCOPY;  Surgeon: Glenna Fellows, MD;  Location: [facility];  Service: General;  Laterality: N/A;  . TUBAL LIGATION  2001   Social History   Social History Narrative   Lives at home with husband and 2 daughters   Right handed   Takes 1/2 caffeine pill per day    Objective: Vital Signs: BP 113/81 (BP Location: Left Arm, Patient Position: Sitting, Cuff Size: Normal)   Pulse 96   Resp 15   Ht 5\' 9"  (1.753 m)   Wt 248 lb (112.5 kg)   BMI 36.62 kg/m    Physical Exam  Constitutional: She is oriented to person, place, and time. She appears well-developed and well-nourished.  HENT:  Head: Normocephalic and atraumatic.  Eyes: Conjunctivae and EOM are normal.  Neck: Normal range of motion.  Cardiovascular: Normal rate, regular rhythm, normal heart sounds and intact distal pulses.  Pulmonary/Chest: Effort normal and breath sounds normal.  Abdominal: Soft. Bowel sounds are normal.  Lymphadenopathy:    She has no cervical adenopathy.  Neurological: She is alert and oriented to person, place, and time.  Skin: Skin is warm and dry. Capillary refill takes less than 2 seconds.  Psychiatric: She has a  normal mood and affect. Her behavior is normal.  Nursing note and vitals reviewed.    Musculoskeletal Exam: C-spine thoracic spine good range of motion.  She has painful range of motion of her lumbar spine which was limited.  Shoulder joints elbow joints wrist joint MCPs PIPs DIPs been good range of motion with no synovitis.  Hip joints knee joints ankles MTPs PIPs were in good range of motion.  She had warmth and swelling over her right knee joint.  CDAI Exam: CDAI Score: Not documented Patient Global Assessment: Not documented; Provider Global Assessment: Not documented Swollen: Not documented; Tender: Not documented Joint Exam   Not documented   There is currently no information documented on the homunculus. Go to the Rheumatology activity and complete the homunculus joint exam.  Investigation: No additional findings.  Imaging: Xr Knee 3 View Right  Result Date: 03/26/2018 Films of the right knee repayment several projections standing.  There is normal alignment.  There is definite decrease in the medial lateral joint spaces associated with arthritis.  There are  peripheral osteophytes in both medial lateral compartment as well as the patellofemoral joint.  Neurosis more in the lateral femoral condyle and medially but patient is more symptomatic medially.  No obvious evidence of CPPD.  Films are consistent with osteoarthritis   Recent Labs: Lab Results  Component Value Date   WBC 4.7 01/24/2018   HGB 12.1 01/24/2018   PLT 364 01/24/2018   NA 139 01/24/2018   K 4.5 01/24/2018   CL 98 01/24/2018   CO2 27 01/24/2018   GLUCOSE 93 01/24/2018   BUN 11 01/24/2018   CREATININE 0.95 01/24/2018   BILITOT 0.4 01/24/2018   ALKPHOS 98 01/24/2018   AST 11 01/24/2018   ALT 14 01/24/2018   PROT 7.0 01/24/2018   ALBUMIN 4.3 01/24/2018   CALCIUM 9.2 01/24/2018   GFRAA 82 01/24/2018    Speciality Comments: No specialty comments available.  Procedures:  No procedures  performed Allergies: Ambien [zolpidem tartrate] and Tape   Assessment / Plan:     Visit Diagnoses: Myofascial pain syndrome-patient continues to have some generalized pain and discomfort.  Chronic pain syndrome-she is on pain medications.  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  Patient has warmth on palpation of her right knee joint today.  She had cortisone injection recently by Dr. Cleophas Dunker.  She states she had no relief from the cortisone injection.  She is going on vacation couple of weeks.  I have given her prescription for Mobic 15 mg p.o. daily total 30 tablets with no refills were given.  Side effects were discussed at length.  I reviewed her x-rays which showed moderate medial lateral compartment narrowing.  I will schedule MRI of her right knee joint to rule out internal derangement.  DDD (degenerative disc disease), lumbar-she continues to have some lower back pain for which she has been getting injections from Dr. Abigail Miyamoto.  Primary insomnia-better with medications but she is currently having discomfort which is causing insomnia.  Other medical problems are listed as follows:  History of multiple sclerosis  History of obesity-weight loss diet and exercise was discussed.  History of anxiety  History of DVT (deep vein thrombosis) -she is currently not taking any anticoagulants.  Orders: Orders Placed This Encounter  Procedures  . MR Knee Right w/o contrast   Meds ordered this encounter  Medications  . meloxicam (MOBIC) 15 MG tablet    Sig: Take 1 tablet (15 mg total) by mouth daily.    Dispense:  30 tablet    Refill:  0  . DISCONTD: diclofenac sodium (VOLTAREN) 1 % GEL    Sig: Apply 3 grams to 3 large joints, up to 3 times daily as needed.    Dispense:  3 Tube    Refill:  3  . Diclofenac Sodium 3 % GEL    Sig: Apply 0.5 grams to 3 large joints twice daily as needed.  Maximum daily dose of 8 grams.    Dispense:  300 g    Refill:  2     Face-to-face time spent with patient was 30 minutes. Greater than 50% of time was spent in counseling and coordination of care.  Follow-Up Instructions: Return in about 6 months (around 10/10/2018) for Osteoarthritis, MFPS.   Pollyann Savoy, MD  Note - This record has been created using Animal nutritionist.  Chart creation errors have been sought, but may not always  have been located. Such creation errors do not reflect on  the standard of medical care.

## 2018-04-11 ENCOUNTER — Ambulatory Visit (INDEPENDENT_AMBULATORY_CARE_PROVIDER_SITE_OTHER): Payer: Medicare Other | Admitting: Rheumatology

## 2018-04-11 ENCOUNTER — Encounter: Payer: Self-pay | Admitting: Rheumatology

## 2018-04-11 VITALS — BP 113/81 | HR 96 | Resp 15 | Ht 69.0 in | Wt 248.0 lb

## 2018-04-11 DIAGNOSIS — M170 Bilateral primary osteoarthritis of knee: Secondary | ICD-10-CM | POA: Diagnosis not present

## 2018-04-11 DIAGNOSIS — M5136 Other intervertebral disc degeneration, lumbar region: Secondary | ICD-10-CM

## 2018-04-11 DIAGNOSIS — G894 Chronic pain syndrome: Secondary | ICD-10-CM | POA: Diagnosis not present

## 2018-04-11 DIAGNOSIS — M7918 Myalgia, other site: Secondary | ICD-10-CM | POA: Diagnosis not present

## 2018-04-11 DIAGNOSIS — Z8669 Personal history of other diseases of the nervous system and sense organs: Secondary | ICD-10-CM

## 2018-04-11 DIAGNOSIS — G35 Multiple sclerosis: Secondary | ICD-10-CM

## 2018-04-11 DIAGNOSIS — Z86718 Personal history of other venous thrombosis and embolism: Secondary | ICD-10-CM

## 2018-04-11 DIAGNOSIS — Z8639 Personal history of other endocrine, nutritional and metabolic disease: Secondary | ICD-10-CM

## 2018-04-11 DIAGNOSIS — F5101 Primary insomnia: Secondary | ICD-10-CM

## 2018-04-11 DIAGNOSIS — Z8659 Personal history of other mental and behavioral disorders: Secondary | ICD-10-CM

## 2018-04-11 MED ORDER — MELOXICAM 15 MG PO TABS: 15.0000 mg | ORAL_TABLET | Freq: Every day | ORAL | 0 refills | Status: DC

## 2018-04-11 MED ORDER — DICLOFENAC SODIUM 1 % TD GEL: TRANSDERMAL | 3 refills | Status: DC

## 2018-04-11 MED ORDER — DICLOFENAC SODIUM 3 % TD GEL: TRANSDERMAL | 2 refills | Status: DC

## 2018-04-17 ENCOUNTER — Telehealth: Payer: Self-pay | Admitting: *Deleted

## 2018-04-17 NOTE — Telephone Encounter (Signed)
Prior Authorization submitted via cover my meds for Voltaren Gel. Will update once response received.  

## 2018-04-18 ENCOUNTER — Encounter: Payer: Self-pay | Admitting: Family Medicine

## 2018-04-18 ENCOUNTER — Ambulatory Visit (INDEPENDENT_AMBULATORY_CARE_PROVIDER_SITE_OTHER): Payer: Medicare Other | Admitting: Family Medicine

## 2018-04-18 VITALS — BP 130/88 | Ht 69.0 in | Wt 251.4 lb

## 2018-04-18 DIAGNOSIS — G894 Chronic pain syndrome: Secondary | ICD-10-CM

## 2018-04-18 DIAGNOSIS — F419 Anxiety disorder, unspecified: Secondary | ICD-10-CM

## 2018-04-18 DIAGNOSIS — Z23 Encounter for immunization: Secondary | ICD-10-CM

## 2018-04-18 MED ORDER — BUPROPION HCL ER (XL) 450 MG PO TB24: 450.0000 mg | ORAL_TABLET | Freq: Every day | ORAL | 0 refills | Status: DC

## 2018-04-18 NOTE — Progress Notes (Signed)
Subjective:    Patient ID: Melinda Moore, female    DOB: Mar 12, 1971, 47 y.o.   MRN: [id number]  HPI  Pt states she saw her rheumotolgist and he took her off of the Cymbalta and Dr.Scott put her on Wellbutrin. Pt is needing something to help with anxiety. Pt states the Cymbalta had effexoer in it that helped with her anxiety and other symptoms of MS.   Reports she was taken off cymbalta last week by her rheumatologist because states it did not help with her pain, denies it helping with her anxiety.   Just started on wellbutrin 1 month ago by Dr. Lorin Picket, feels like it is helpful for her depressed moods, not helping as much with anxiety. Feels like anxiety is increased when she is taking care of her grandchildren and her grown child at home, reports she is easily irritated and feels anxious often. States she had previously been on effexor and thought that was helpful but this was d/c'd when she started the cymbalta a couple years ago.   Does not want to take opiates with her on her cruise next week - wondering what else she can take for her pain.  Review of Systems  Constitutional: Negative for activity change and appetite change.  Respiratory: Negative for shortness of breath.   Cardiovascular: Negative for chest pain.  Psychiatric/Behavioral: Negative for dysphoric mood and suicidal ideas. The patient is nervous/anxious.        Objective:   Physical Exam  Constitutional: She is oriented to person, place, and time. She appears well-developed and well-nourished. No distress.  HENT:  Head: Normocephalic and atraumatic.  Eyes: Right eye exhibits no discharge. Left eye exhibits no discharge.  Neck: Neck supple.  Cardiovascular: Normal rate, regular rhythm and normal heart sounds.  No murmur heard. Pulmonary/Chest: Effort normal and breath sounds normal. No respiratory distress.  Neurological: She is alert and oriented to person, place, and time.  Skin: Skin is warm and dry.  Psychiatric:  She has a normal mood and affect. Her behavior is normal.  Dressed appropriately, speech clear and coherent.  Nursing note and vitals reviewed.  GAD 7 : Generalized Anxiety Score 04/18/2018  Nervous, Anxious, on Edge 2  Control/stop worrying 0  Worry too much - different things 0  Trouble relaxing 2  Restless 0  Easily annoyed or irritable 3  Afraid - awful might happen 0  Total GAD 7 Score 7  Anxiety Difficulty Somewhat difficult       Assessment & Plan:  1. Chronic anxiety GAD 7 results reviewed with patient. After reviewing her chart and discussing with Dr. Brett Canales feel it is best to change her wellbutrin to the xl formulation at 450 mg daily. Recommend f/u in 3 weeks with Dr. Lorin Picket to see how she is tolerating this change.  Discussed with patient her chronic pain and given the amount of opiates she is on do not feel it would be wise for her to try to come off of her medication for her cruise next week. Advise she take all her needed medications with her.  2. Need for vaccination - Plan: Tdap vaccine greater than or equal to 7yo IM  Dr. Lubertha South was consulted on this case, he also saw this patient, and is in agreement with the above treatment plan.  25 minutes was spent with the patient.  This statement verifies that 25 minutes was indeed spent with the patient.  More than 50% of this visit-total duration of the  visit-was spent in counseling and coordination of care. The issues that the patient came in for today as reflected in the diagnosis (s) please refer to documentation for further details. As attending physician to this patient visit, this patient was seen in conjunction with the nurse practitioner.  The history,physical and treatment plan was reviewed with the nurse practitioner and pertinent findings were verified with the patient.  Also the treatment plan was reviewed with the patient while they were present. WSLMD

## 2018-04-23 ENCOUNTER — Encounter: Payer: Self-pay | Admitting: Family Medicine

## 2018-04-23 ENCOUNTER — Ambulatory Visit (INDEPENDENT_AMBULATORY_CARE_PROVIDER_SITE_OTHER): Payer: Medicare Other | Admitting: Family Medicine

## 2018-04-23 ENCOUNTER — Telehealth: Payer: Self-pay | Admitting: Family Medicine

## 2018-04-23 VITALS — BP 128/84 | Temp 97.6°F | Ht 69.0 in | Wt 244.0 lb

## 2018-04-23 DIAGNOSIS — R0981 Nasal congestion: Secondary | ICD-10-CM

## 2018-04-23 DIAGNOSIS — J329 Chronic sinusitis, unspecified: Secondary | ICD-10-CM

## 2018-04-23 MED ORDER — CEFTRIAXONE SODIUM 500 MG IJ SOLR
500.0000 mg | Freq: Once | INTRAMUSCULAR | Status: AC
  Administered 2018-04-23: 500 mg via INTRAMUSCULAR

## 2018-04-23 MED ORDER — CEFPROZIL 500 MG PO TABS: ORAL_TABLET | ORAL | 0 refills | Status: DC

## 2018-04-23 MED ORDER — PREDNISONE 20 MG PO TABS: ORAL_TABLET | ORAL | 0 refills | Status: DC

## 2018-04-23 NOTE — Telephone Encounter (Signed)
Pt states "yes" she would like to try the short course of Prednisone that Dr. Lorin Picket mentioned to pt in a MyChart message  Please send to [facility] & call pt when done

## 2018-04-23 NOTE — Telephone Encounter (Signed)
Prescription sent electronically to pharmacy. Patient notified. 

## 2018-04-23 NOTE — Progress Notes (Signed)
   Subjective:    Patient ID: Melinda Moore, female    DOB: 11/06/1970, 47 y.o.   MRN: [id number]  HPI Pt here today for congestion. Pt is having stuffy nose, sore throat, headache, nose stopped up, cough, and ear pain. and rhinorrhea. Has been going on for 2 days. Did try Airborne and Zicam with no relief.  Pos nasal dischrage  ckear in nature  Right ear more painful after blowing the nose  Using netti pot soe     dpoe not feel cong in chest  Frontal ha and cong and stuffies   Review of Systems No headache, no major weight loss or weight gain, no chest pain no back pain abdominal pain no change in bowel habits complete ROS otherwise negative     Objective:   Physical Exam  Alert, mild malaise. Hydration good Vitals stable. frontal/ maxillary tenderness evident positive nasal congestion. pharynx normal neck supple  lungs clear/no crackles or wheezes. heart regular in rhythm       Assessment & Plan:  Impression rhinosinusitis likely post viral, discussed with patient. plan antibiotics prescribed. Questions answered. Symptomatic care discussed. warning signs discussed. WSL Patient advised some of this may be viral.  Encouraged to use Afrin spray for 4 hours before the plane trip

## 2018-04-23 NOTE — Telephone Encounter (Signed)
Prednisone 20 mg, 3 daily for the next 2 days, then 2 daily's for 2 days then 1 daily for 4 days

## 2018-05-06 ENCOUNTER — Ambulatory Visit (HOSPITAL_COMMUNITY): Payer: 59

## 2018-05-12 ENCOUNTER — Ambulatory Visit (HOSPITAL_COMMUNITY): Payer: 59

## 2018-05-12 ENCOUNTER — Ambulatory Visit: Payer: Medicare Other | Admitting: Family Medicine

## 2018-05-13 ENCOUNTER — Ambulatory Visit (HOSPITAL_COMMUNITY): Payer: 59

## 2018-05-16 ENCOUNTER — Ambulatory Visit (HOSPITAL_COMMUNITY)
Admission: RE | Admit: 2018-05-16 | Discharge: 2018-05-16 | Disposition: A | Discharge status: Home / Self Care | Payer: 59 | Source: Ambulatory Visit | Attending: Rheumatology | Admitting: Rheumatology

## 2018-05-16 DIAGNOSIS — M170 Bilateral primary osteoarthritis of knee: Secondary | ICD-10-CM

## 2018-05-16 DIAGNOSIS — M1711 Unilateral primary osteoarthritis, right knee: Secondary | ICD-10-CM | POA: Diagnosis not present

## 2018-05-19 ENCOUNTER — Telehealth: Payer: Self-pay

## 2018-05-19 NOTE — Progress Notes (Signed)
Please notify patient that the MRI is consistent with osteoarthritis.  No meniscal tear was noted.

## 2018-05-19 NOTE — Progress Notes (Signed)
She may benefit from Visco supplement injections.  If she is interested we can schedule.

## 2018-05-19 NOTE — Telephone Encounter (Signed)
Please apply for right knee visco, per Dr. Deveshwar. Thanks! 

## 2018-05-20 ENCOUNTER — Other Ambulatory Visit: Payer: Self-pay | Admitting: Family Medicine

## 2018-05-22 NOTE — Telephone Encounter (Signed)
Noted  

## 2018-05-23 ENCOUNTER — Encounter: Payer: Self-pay | Admitting: Family Medicine

## 2018-05-23 ENCOUNTER — Ambulatory Visit (INDEPENDENT_AMBULATORY_CARE_PROVIDER_SITE_OTHER): Payer: Medicare Other | Admitting: Family Medicine

## 2018-05-23 ENCOUNTER — Ambulatory Visit: Payer: Medicare Other | Admitting: Family Medicine

## 2018-05-23 VITALS — BP 106/72 | Ht 67.0 in | Wt 241.0 lb

## 2018-05-23 DIAGNOSIS — F411 Generalized anxiety disorder: Secondary | ICD-10-CM | POA: Diagnosis not present

## 2018-05-23 MED ORDER — BUPROPION HCL ER (SR) 150 MG PO TB12: ORAL_TABLET | ORAL | 5 refills | Status: DC

## 2018-05-23 NOTE — Progress Notes (Signed)
   Subjective:    Patient ID: Melinda Moore, female    DOB: Apr 13, 1971, 46 y.o.   MRN: [id number]  HPI  Patient is here today to follow up on depression.She says it is more of an anxiety issue. She was started on Wellbutrin XL 450 mg once per day. She says is makes her "Jumpy". She wanted to if she could go back to the 150 mg TID.She is filling out both Phq 9 and Gad.Over all she says she can not tell a difference while on the medication.  Patient under significant amount of stress she states that the medication making her feel jumpy she denies any major issues she knows she has to help the stress at home if she is going to feel better but having some difficulties with a grownup child who has not been responsible  Review of Systems  Constitutional: Negative for activity change and appetite change.  HENT: Negative for congestion and rhinorrhea.   Respiratory: Negative for cough and shortness of breath.   Cardiovascular: Negative for chest pain and leg swelling.  Gastrointestinal: Negative for abdominal pain, nausea and vomiting.  Skin: Negative for color change.  Neurological: Negative for dizziness and weakness.  Psychiatric/Behavioral: Negative for agitation and confusion.       Objective:   Physical Exam Vitals signs reviewed.  Constitutional:      General: She is not in acute distress. HENT:     Head: Normocephalic.  Cardiovascular:     Rate and Rhythm: Normal rate and regular rhythm.     Heart sounds: Normal heart sounds. No murmur.  Pulmonary:     Effort: Pulmonary effort is normal.     Breath sounds: Normal breath sounds.  Lymphadenopathy:     Cervical: No cervical adenopathy.  Neurological:     Mental Status: She is alert.  Psychiatric:        Behavior: Behavior normal.           Assessment & Plan:  Significant anxiety related issues Has an adult child staying at home who is not working not contributing and causes a lot of grief We talked about constructive ways to  address that Ultimately her adult child needs to either get an education or get a good job and move out hopefully  Patient denies being depressed just more stressed and anxious she states that the 450 mg taken daily causes her to feel jumpy but when she was taking 150 mg 3 times a day she did not have that problem so she is requesting that we prescribe it as 150 mg SR 3 times daily  This was completed as such  Follow-up office visit in January we will see how things are going  If she does not tolerate this dose we will shifted over to BuSpar in the lower dose of Wellbutrin

## 2018-05-26 ENCOUNTER — Telehealth: Payer: Self-pay | Admitting: Rheumatology

## 2018-05-26 NOTE — Telephone Encounter (Signed)
Patient called checking the status of her gel injections.  Patient requested a return call.

## 2018-05-28 NOTE — Telephone Encounter (Signed)
Talked with patient and advised her that benefits are pending for gel injection.  Patient stated that husband's insurance([employer]) is the primary insurance and her insurance [facility] Medicare is the secondary.  Advised patient I will submit in that order.

## 2018-06-10 ENCOUNTER — Ambulatory Visit: Payer: Medicare Other | Admitting: Family Medicine

## 2018-06-13 ENCOUNTER — Telehealth (INDEPENDENT_AMBULATORY_CARE_PROVIDER_SITE_OTHER): Payer: Self-pay

## 2018-06-13 NOTE — Telephone Encounter (Signed)
Submitted VOB for Euflexxa series, right knee. 

## 2018-06-17 ENCOUNTER — Telehealth (INDEPENDENT_AMBULATORY_CARE_PROVIDER_SITE_OTHER): Payer: Self-pay

## 2018-06-17 ENCOUNTER — Ambulatory Visit (INDEPENDENT_AMBULATORY_CARE_PROVIDER_SITE_OTHER): Payer: Medicare Other | Admitting: Family Medicine

## 2018-06-17 ENCOUNTER — Encounter: Payer: Self-pay | Admitting: Family Medicine

## 2018-06-17 ENCOUNTER — Ambulatory Visit: Payer: Medicare Other | Admitting: Family Medicine

## 2018-06-17 VITALS — BP 108/82 | Ht 67.0 in | Wt 244.0 lb

## 2018-06-17 DIAGNOSIS — G894 Chronic pain syndrome: Secondary | ICD-10-CM

## 2018-06-17 MED ORDER — BUSPIRONE HCL 5 MG PO TABS: 5.0000 mg | ORAL_TABLET | Freq: Three times a day (TID) | ORAL | 2 refills | Status: DC

## 2018-06-17 MED ORDER — HYDROMORPHONE HCL 4 MG PO TABS: ORAL_TABLET | ORAL | 0 refills | Status: DC

## 2018-06-17 NOTE — Telephone Encounter (Signed)
Submitted for Synvisc series, right knee due to Euflexxa not being a preferred product through patient's insurance.

## 2018-06-17 NOTE — Progress Notes (Signed)
   Subjective:    Patient ID: Melinda Moore, female    DOB: 1970-10-09, 48 y.o.   MRN: [id number]  HPI Patient is here today to follow up on her chronic health issues. She has a history of anxiety and is currently on Wellbutrin 150 mg one po Tid.  She is also here today for pain management. Patient states her pain management is not adequate She is taking 6 oxycodones daily without getting relief She used to be on Dilaudid 4 mg and she feels that that did better She would like to try that again Unfortunately she is also having some anxiety issues and wonders if we can add anything to her Wellbutrin I talked with her about safety we will go with BuSpar cannot do Xanax or Valium  This patient was seen today for chronic pain  The medication list was reviewed and updated.   -Compliance with medication: yes  - Number patient states they take daily:6 per day  -when was the last dose patient took? Noon today  The patient was advised the importance of maintaining medication and not using illegal substances with these.  Here for refills and follow up  The patient was educated that we can provide 3 monthly scripts for their medication, it is their responsibility to follow the instructions.  Side effects or complications from medications: None  Patient is aware that pain medications are meant to minimize the severity of the pain to allow their pain levels to improve to allow for better function. They are aware of that pain medications cannot totally remove their pain.  Due for UDT ( at least once per year) : 12/25/2018 Her pain is primarily in her back as well as her right knee she states is not tolerable        Objective:   Physical Exam Vitals signs reviewed.  Constitutional:      General: She is not in acute distress. HENT:     Head: Normocephalic.  Cardiovascular:     Rate and Rhythm: Normal rate and regular rhythm.     Heart sounds: Normal heart sounds. No murmur.  Pulmonary:       Effort: Pulmonary effort is normal.     Breath sounds: Normal breath sounds.  Lymphadenopathy:     Cervical: No cervical adenopathy.  Neurological:     Mental Status: She is alert.  Psychiatric:        Behavior: Behavior normal.           Assessment & Plan:  Subpar control of her pain we will try Dilaudid 4 mg tablet she will start off with 1/2 tablet every 6 hours as needed for pain if that does not do enough she will advance it to a full tablet every 6 hours caution drowsiness if it makes her feel drowsy drugged her out of that she will stop the medicine She will give [place] feedback within 5 to 6 days how she is doing she will follow-up in 1 week for recheck She will bring both her prescription bottle for Dilaudid and oxycodone when she comes she was instructed to stop the oxycodone  Anxiety related issues patient denies being suicidal continue medications add BuSpar 3 times daily 5 mg

## 2018-06-23 ENCOUNTER — Encounter: Payer: Self-pay | Admitting: Family Medicine

## 2018-06-23 ENCOUNTER — Ambulatory Visit (INDEPENDENT_AMBULATORY_CARE_PROVIDER_SITE_OTHER): Payer: Medicare Other | Admitting: Family Medicine

## 2018-06-23 VITALS — Ht 67.0 in | Wt 245.2 lb

## 2018-06-23 DIAGNOSIS — G894 Chronic pain syndrome: Secondary | ICD-10-CM | POA: Diagnosis not present

## 2018-06-23 DIAGNOSIS — F411 Generalized anxiety disorder: Secondary | ICD-10-CM

## 2018-06-23 MED ORDER — HYDROMORPHONE HCL 4 MG PO TABS: ORAL_TABLET | ORAL | 0 refills | Status: DC

## 2018-06-23 MED ORDER — SERTRALINE HCL 50 MG PO TABS: 50.0000 mg | ORAL_TABLET | Freq: Every day | ORAL | 3 refills | Status: DC

## 2018-06-23 NOTE — Progress Notes (Signed)
   Subjective:    Patient ID: Melinda Moore, female    DOB: 07-07-1970, 48 y.o.   MRN: [id number]  HPI Patient arrives for a follow up on pain medication. Patient was started on a new pain medication at last visit and stated she is doing well with the new medication. Patient states she is taking Dilaudid 4mg  1/2-1 every 6 hrs no more than 4 a day and states she is getting better pain control tan with the oxycodone. This patient states that Dilaudid is doing much better helping her pain 4 mg tablet she is taking 4/day This is a morphine equivalent of 64 mg She is no longer taking the oxycodone She brings these in and it is representative aware it should be Patient denies that Dilaudid causing drowsiness She denies feeling dopey or sleepy with it  She still has a lot of stress and anxiety we talked about that she will stop the BuSpar she did not feel it was helping and we will go ahead and get her referred for counseling  Patient does not want pain management referral currently Review of Systems  Constitutional: Negative for activity change and appetite change.  HENT: Negative for congestion and rhinorrhea.   Respiratory: Negative for cough and shortness of breath.   Cardiovascular: Negative for chest pain and leg swelling.  Gastrointestinal: Negative for abdominal pain, nausea and vomiting.  Skin: Negative for color change.  Neurological: Negative for dizziness and weakness.  Psychiatric/Behavioral: Negative for agitation and confusion.       Objective:   Physical Exam Vitals signs reviewed.  Constitutional:      General: She is not in acute distress. HENT:     Head: Normocephalic.  Cardiovascular:     Rate and Rhythm: Normal rate and regular rhythm.     Heart sounds: Normal heart sounds. No murmur.  Pulmonary:     Effort: Pulmonary effort is normal.     Breath sounds: Normal breath sounds.  Lymphadenopathy:     Cervical: No cervical adenopathy.  Neurological:     Mental  Status: She is alert.  Psychiatric:        Behavior: Behavior normal.           Assessment & Plan:  Her oxycodone will be taken to [facility] and disposed of within their drug containers  Dilaudid 4 mg 1 4 times daily #120 patient will give [place] feedback in approximately 3 to 4 weeks how this is doing if it is still doing well then we will send in 2 additional scripts she will follow-up in 3 months

## 2018-06-24 ENCOUNTER — Ambulatory Visit: Payer: Medicare Other | Admitting: Family Medicine

## 2018-06-25 ENCOUNTER — Ambulatory Visit: Payer: Medicare Other | Admitting: Family Medicine

## 2018-06-25 ENCOUNTER — Telehealth (INDEPENDENT_AMBULATORY_CARE_PROVIDER_SITE_OTHER): Payer: Self-pay

## 2018-06-25 NOTE — Telephone Encounter (Signed)
Error

## 2018-06-25 NOTE — Telephone Encounter (Signed)
PA required for Synvisc series, right knee.

## 2018-06-27 ENCOUNTER — Ambulatory Visit: Payer: Medicare Other | Admitting: Family Medicine

## 2018-07-01 ENCOUNTER — Telehealth (INDEPENDENT_AMBULATORY_CARE_PROVIDER_SITE_OTHER): Payer: Self-pay

## 2018-07-01 NOTE — Telephone Encounter (Signed)
PA required for Synvisc series, right knee through [facility] primary. Initiated PA with [facility].  [employer] preferred products are Durolane and Gelsyn-3.  Per Diamond Nickel C.response time is about 15 calendar days. PA Pending PA# [id number]  Will submit for Gelsyn-3, right knee.

## 2018-07-07 ENCOUNTER — Telehealth: Payer: Self-pay | Admitting: Rheumatology

## 2018-07-07 NOTE — Telephone Encounter (Signed)
Attempted to contact the patient and left message for patient to call the office.  

## 2018-07-07 NOTE — Telephone Encounter (Signed)
Patient called requesting a return call to let her know if it would be okay to schedule an appointment for a cortisone injection for her right knee while she waits for her  gel injections to be approved.

## 2018-07-08 ENCOUNTER — Ambulatory Visit (INDEPENDENT_AMBULATORY_CARE_PROVIDER_SITE_OTHER): Payer: 59 | Admitting: Rheumatology

## 2018-07-08 ENCOUNTER — Encounter: Payer: Self-pay | Admitting: Physician Assistant

## 2018-07-08 VITALS — BP 109/73 | HR 95 | Resp 15 | Ht 69.0 in | Wt 240.0 lb

## 2018-07-08 DIAGNOSIS — M7918 Myalgia, other site: Secondary | ICD-10-CM

## 2018-07-08 DIAGNOSIS — M170 Bilateral primary osteoarthritis of knee: Secondary | ICD-10-CM | POA: Diagnosis not present

## 2018-07-08 DIAGNOSIS — G894 Chronic pain syndrome: Secondary | ICD-10-CM

## 2018-07-08 DIAGNOSIS — Z86718 Personal history of other venous thrombosis and embolism: Secondary | ICD-10-CM

## 2018-07-08 DIAGNOSIS — Z8669 Personal history of other diseases of the nervous system and sense organs: Secondary | ICD-10-CM

## 2018-07-08 DIAGNOSIS — G8929 Other chronic pain: Secondary | ICD-10-CM

## 2018-07-08 DIAGNOSIS — Z8659 Personal history of other mental and behavioral disorders: Secondary | ICD-10-CM

## 2018-07-08 DIAGNOSIS — M5136 Other intervertebral disc degeneration, lumbar region: Secondary | ICD-10-CM | POA: Diagnosis not present

## 2018-07-08 DIAGNOSIS — F5101 Primary insomnia: Secondary | ICD-10-CM

## 2018-07-08 DIAGNOSIS — Z8639 Personal history of other endocrine, nutritional and metabolic disease: Secondary | ICD-10-CM

## 2018-07-08 DIAGNOSIS — G35 Multiple sclerosis: Secondary | ICD-10-CM

## 2018-07-08 DIAGNOSIS — M25561 Pain in right knee: Secondary | ICD-10-CM | POA: Diagnosis not present

## 2018-07-08 MED ORDER — LIDOCAINE HCL 1 % IJ SOLN
1.5000 mL | INTRAMUSCULAR | Status: AC | PRN
  Administered 2018-07-08: 1.5 mL

## 2018-07-08 MED ORDER — TRIAMCINOLONE ACETONIDE 40 MG/ML IJ SUSP
60.0000 mg | INTRAMUSCULAR | Status: AC | PRN
  Administered 2018-07-08: 60 mg via INTRA_ARTICULAR

## 2018-07-08 NOTE — Progress Notes (Signed)
Office Visit Note  Patient: Melinda Moore             Date of Birth: 06/29/1970           MRN: [id number]             PCP: Babs Sciara, MD Referring: Babs Sciara, MD Visit Date: 07/08/2018 Occupation: @GUAROCC @  Subjective:  Right knee joint pain   History of Present Illness: Melinda Moore is a 48 y.o. female with history of myofascial pain syndrome and osteoarthritis.  She presents today with right knee joint pain that has progressively been worsening for the past 1 year.  She has not had any recent injuries or falls.  She had a right knee joint cortisone injection on 03/26/18 performed by Dr. Cleophas Dunker.  She had relief for only 1 day. She had a MRI of the right knee on 05/16/18.  She is waiting to hear back from her insurance about the approval of visco gel injections.  She states she has been in severe pain.  She is having pain radiating down the right leg. She denies any calf pain or tightness. She has tried icing it and using a compression sleeve.  The sleeve increased the pain. She takes dilaudid for pain relief. She takes tylenol arthritis for breakthrough pain.  She uses voltaren gel topically BID.  She has noticed warmth and swelling.  She has been having severe pain at night.  She has difficulty climbing steps. She occasionally hears a pop randomly. She denies any other joint pain and joint swelling.      Activities of Daily Living:  Patient reports morning stiffness for 5 minutes.   Patient Reports nocturnal pain.  Difficulty dressing/grooming: Denies Difficulty climbing stairs: Reports Difficulty getting out of chair: Reports Difficulty using hands for taps, buttons, cutlery, and/or writing: Denies  Review of Systems  Constitutional: Negative for fatigue.  HENT: Negative for mouth sores, mouth dryness and nose dryness.   Eyes: Positive for dryness. Negative for pain and visual disturbance.  Respiratory: Negative for cough, hemoptysis, shortness of breath and difficulty  breathing.   Cardiovascular: Negative for chest pain, palpitations, hypertension and swelling in legs/feet.  Gastrointestinal: Negative for blood in stool, constipation and diarrhea.  Endocrine: Negative for increased urination.  Genitourinary: Negative for painful urination.  Musculoskeletal: Positive for arthralgias, joint pain, joint swelling and morning stiffness. Negative for myalgias, muscle weakness, muscle tenderness and myalgias.  Skin: Negative for color change, pallor, rash, hair loss, nodules/bumps, skin tightness, ulcers and sensitivity to sunlight.  Allergic/Immunologic: Negative for susceptible to infections.  Neurological: Negative for dizziness, numbness, headaches and weakness.  Hematological: Negative for swollen glands.  Psychiatric/Behavioral: Negative for depressed mood and sleep disturbance. The patient is not nervous/anxious.     PMFS History:  Patient Active Problem List   Diagnosis Date Noted  . Right leg pain 03/12/2017  . Gait abnormality 03/12/2017  . Radiculopathy due to lumbar intervertebral disc disorder 09/21/2016  . Spondylosis without myelopathy or radiculopathy, lumbar region 09/21/2016  . History of anxiety 09/08/2016  . Primary osteoarthritis of right knee 08/30/2016  . Primary insomnia 08/30/2016  . Myofascial pain 08/30/2016  . Sciatica, right side 12/12/2015  . Right knee pain 12/12/2015  . Morbid obesity (HCC) 08/12/2015  . Lumbago 02/10/2014  . Chronic anxiety 07/08/2013  . Swelling of breast 04/08/2013  . Chronic pain syndrome 03/11/2013  . Port catheter in place 12/31/2012  . Unspecified constipation 10/01/2012  . Anemia 10/01/2012  .  Rectal bleeding 10/01/2012  . Elevated alkaline phosphatase level 10/01/2012  . MS (multiple sclerosis) (HCC) 09/02/2012  . DVT (deep venous thrombosis) (HCC) 09/02/2012  . ANEMIA 04/12/2010  . NAUSEA WITH VOMITING 04/12/2010  . OTHER DYSPHAGIA 04/12/2010    Past Medical History:  Diagnosis Date  .  Anemia   . Anxiety   . Bell's palsy   . Depression   . DVT (deep venous thrombosis) (HCC) 09/02/2012   [place] on 02/13/2012 showed acute DVT in left calf veins and posterior tibial veins  . Fibromyalgia   . GERD (gastroesophageal reflux disease)   . History of blood transfusion   . HTN (hypertension)   . Leukopenia   . MS (multiple sclerosis) (HCC) 1997  . Neuropathic pain   . Peripheral edema   . Port catheter in place 12/31/2012    Family History  Problem Relation Age of Onset  . Heart disease Father   . Hyperlipidemia Father   . Congestive Heart Failure Father   . Hypertension Sister   . Other Paternal Uncle        Possible colon cancer, age greater than 71  . Cirrhosis Brother        etoh  . Healthy Mother   . Healthy Daughter   . Healthy Daughter    Past Surgical History:  Procedure Laterality Date  . ABDOMINAL HYSTERECTOMY    . CHOLECYSTECTOMY    . ESOPHAGOGASTRODUODENOSCOPY  2011   Dr. Jena Gauss. Possible cervical esophageal with status post disruption with passage of Maloney dilator, glycol esophageal erythema, antral erosions. Biopsy from the stomach revealed minimal chronic inactive inflammation but negative for H. pylori  . LAPAROSCOPIC GASTRIC SLEEVE RESECTION N/A 04/17/2016   Procedure: LAPAROSCOPIC GASTRIC SLEEVE RESECTION WITH UPPER ENDOSCOPY;  Surgeon: Glenna Fellows, MD;  Location: [facility];  Service: General;  Laterality: N/A;  . TUBAL LIGATION  2001   Social History   Social History Narrative   Lives at home with husband and 2 daughters   Right handed   Takes 1/2 caffeine pill per day   Immunization History  Administered Date(s) Administered  . Influenza Split 04/06/2013  . Influenza,inj,Quad PF,6+ Mos 05/24/2015, 03/16/2016, 03/25/2017, 03/17/2018  . Influenza-Unspecified 03/10/2012, 03/31/2014  . Pneumococcal-Unspecified 03/21/2006  . Tdap 04/18/2018     Objective: Vital Signs: BP 109/73 (BP Location: Left Arm, Patient Position: Sitting, Cuff Size:  Normal)   Pulse 95   Resp 15   Ht 5\' 9"  (1.753 m)   Wt 240 lb (108.9 kg)   BMI 35.44 kg/m    Physical Exam Vitals signs and nursing note reviewed.  Constitutional:      Appearance: She is well-developed.  HENT:     Head: Normocephalic and atraumatic.  Eyes:     Conjunctiva/sclera: Conjunctivae normal.  Neck:     Musculoskeletal: Normal range of motion.  Cardiovascular:     Rate and Rhythm: Normal rate and regular rhythm.     Heart sounds: Normal heart sounds.  Pulmonary:     Effort: Pulmonary effort is normal.     Breath sounds: Normal breath sounds.  Abdominal:     General: Bowel sounds are normal.     Palpations: Abdomen is soft.  Lymphadenopathy:     Cervical: No cervical adenopathy.  Skin:    General: Skin is warm and dry.     Capillary Refill: Capillary refill takes less than 2 seconds.  Neurological:     Mental Status: She is alert and oriented to person, place, and time.  Psychiatric:        Behavior: Behavior normal.      Musculoskeletal Exam: C-spine, thoracic spine, and lumbar spine good ROM.  No midline spinal tenderness.  No SI joint tenderness.  Shoulder joints, elbow joints, wrist joints, MCPs, PIPs, and DIPs good ROM with no synovitis.  Complete fist formation bilaterally.  Hip joints good ROM with no discomfort.  Right knee minimal warmth was noted..  She has painful ROM of the right knee joint on exam. Left knee good ROM with no warmth or effusion.  Ankle joints good ROM with no tenderness or swelling.   CDAI Exam: CDAI Score: Not documented Patient Global Assessment: Not documented; Provider Global Assessment: Not documented Swollen: Not documented; Tender: Not documented Joint Exam   Not documented   There is currently no information documented on the homunculus. Go to the Rheumatology activity and complete the homunculus joint exam.  Investigation: No additional findings.  Imaging: No results found.  Recent Labs: Lab Results  Component  Value Date   WBC 4.7 01/24/2018   HGB 12.1 01/24/2018   PLT 364 01/24/2018   NA 139 01/24/2018   K 4.5 01/24/2018   CL 98 01/24/2018   CO2 27 01/24/2018   GLUCOSE 93 01/24/2018   BUN 11 01/24/2018   CREATININE 0.95 01/24/2018   BILITOT 0.4 01/24/2018   ALKPHOS 98 01/24/2018   AST 11 01/24/2018   ALT 14 01/24/2018   PROT 7.0 01/24/2018   ALBUMIN 4.3 01/24/2018   CALCIUM 9.2 01/24/2018   GFRAA 82 01/24/2018    Speciality Comments: No specialty comments available.  Procedures:  Large Joint Inj: R knee on 07/08/2018 3:31 PM Indications: pain Details: 27 G 1.5 in needle, medial approach  Arthrogram: No  Medications: 1.5 mL lidocaine 1 %; 60 mg triamcinolone acetonide 40 MG/ML Aspirate: 0 mL Outcome: tolerated well, no immediate complications Procedure, treatment alternatives, risks and benefits explained, specific risks discussed. Consent was given by the patient. Immediately prior to procedure a time out was called to verify the correct patient, procedure, equipment, support staff and site/side marked as required. Patient was prepped and draped in the usual sterile fashion.     Allergies: Ambien [zolpidem tartrate] and Tape   Assessment / Plan:     Visit Diagnoses: Myofascial pain syndrome: She had mild generalized muscle aches and tenderness at this time.  She continues to have chronic fatigue and has been having difficulty sleeping at night due to the pain she has been experiencing.   She takes Dilaudid 4 mg 1/2 to 1 tablet every 6 hours PRN for pain relief.   Chronic pain syndrome: She is prescribed Dilaudid 4 mg 1/2 to 1 tablet every 6 hours PRN by PCP for pain relief. She takes tylenol arthritis 650 mg po for breakthrough pain. She uses voltaren gel topically on the right knee joint BID for pain relief as well.   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  She has not had any recent injuries or falls.  She has no mechanical symptoms at this time.  She has been experiencing severe pain.  She has mild warmth of the right knee joint.  She had a right knee cortisone injection 03/26/2018 performed by Dr. Cleophas Dunker.  She only received 1 day of relief following the injection.  We are waiting on the approval of visco gel injections. She had a MRI  of the right knee joint on 05/16/18 that revealed tricompartmental OA and mild blunting of medial mensicus likely reflecting a degenerative tear.  She has been using voltaren gel topically BID.  She has tried icing it and using a compression sleeve with no relief.  She has tried a brace in the past which aggravated the right knee more. She has been taking Dilaudid and tylenol arthritis for pain relief.  I reviewed the MRI results with Dr. Cleophas DunkerWhitfield.  He felt the symptoms are related to osteoarthritis and not meniscal tear.  After informed consent was obtained and different treatment options were discussed the right knee joint was injected with cortisone as described above.  If patient has persistent symptoms she is supposed to notify me.  My concern is if her knee pain is related to her lower back.  DDD (degenerative disc disease), lumbar: She has chronic lower back pain.  She performs stretching exercises on a daily basis.  She has no symptoms of radiculopathy at this time.    Primary insomnia: She has been having interrupted sleep at night due the pain she has been experiencing in the right knee joint.    Other medical conditions are listed as follows:   History of DVT (deep vein thrombosis)  History of anxiety  History of multiple sclerosis  History of obesity   Orders: Orders Placed This Encounter  Procedures  . Large Joint Inj: R knee   No orders of the defined types were placed in this encounter.   Face-to-face time spent with patient was 30 minutes. Greater than 50% of time was spent in counseling  and coordination of care.  Follow-Up Instructions: Return in about 6 months (around 01/06/2019) for Myofascial pain syndrome , Osteoarthritis.   Pollyann SavoyShaili Jayquon Theiler, MD  Note - This record has been created using Animal nutrition[employer]Dragon software.  Chart creation errors have been sought, but may not always  have been located. Such creation errors do not reflect on  the standard of medical care.

## 2018-07-11 ENCOUNTER — Telehealth (INDEPENDENT_AMBULATORY_CARE_PROVIDER_SITE_OTHER): Payer: Self-pay

## 2018-07-11 NOTE — Telephone Encounter (Signed)
Please schedule patient an appointment with Dr. Corliss Skains or Ladona Ridgel for gel injection.  Thank You.  Approved for Gelsyn-3, right knee. Buy & Bill Patient will be responsible for 20% OOP. Co-pay of $35.00 No PA required

## 2018-07-14 ENCOUNTER — Encounter: Payer: Self-pay | Admitting: Family Medicine

## 2018-07-14 NOTE — Telephone Encounter (Signed)
LMOM for patient to call and schedule Gelsyn-3 injections for her right knee.

## 2018-07-16 ENCOUNTER — Other Ambulatory Visit: Payer: Self-pay | Admitting: Family Medicine

## 2018-07-16 MED ORDER — HYDROMORPHONE HCL 4 MG PO TABS: ORAL_TABLET | ORAL | 0 refills | Status: DC

## 2018-07-16 MED ORDER — HYDROMORPHONE HCL 4 MG PO TABS: 4.0000 mg | ORAL_TABLET | Freq: Four times a day (QID) | ORAL | 0 refills | Status: DC | PRN

## 2018-07-17 ENCOUNTER — Encounter: Payer: Self-pay | Admitting: Family Medicine

## 2018-07-17 ENCOUNTER — Ambulatory Visit (INDEPENDENT_AMBULATORY_CARE_PROVIDER_SITE_OTHER): Payer: Medicare Other | Admitting: Family Medicine

## 2018-07-17 VITALS — BP 110/78 | Temp 100.3°F | Wt 240.0 lb

## 2018-07-17 DIAGNOSIS — J111 Influenza due to unidentified influenza virus with other respiratory manifestations: Secondary | ICD-10-CM | POA: Diagnosis not present

## 2018-07-17 DIAGNOSIS — G894 Chronic pain syndrome: Secondary | ICD-10-CM

## 2018-07-17 MED ORDER — ONDANSETRON HCL 8 MG PO TABS: 8.0000 mg | ORAL_TABLET | Freq: Three times a day (TID) | ORAL | 1 refills | Status: DC | PRN

## 2018-07-17 MED ORDER — OSELTAMIVIR PHOSPHATE 75 MG PO CAPS: 75.0000 mg | ORAL_CAPSULE | Freq: Two times a day (BID) | ORAL | 0 refills | Status: DC

## 2018-07-17 NOTE — Progress Notes (Addendum)
   Subjective:    Patient ID: Melinda Moore, female    DOB: September 24, 1970, 48 y.o.   MRN: [id number]  HPI Patient is here today with complaints of a cough,runny nose,headache,chest burns when she coughs, body aches,fever. She states she woke up yesterday am with this. She has been using Tussin Dm, and nothing else.  Patient relates body aches headache burning in the chest denies wheezing difficulty breathing but she states she feels awful has a history of MS and chronic pain  Review of Systems  Constitutional: Negative for activity change and fever.  HENT: Positive for congestion and rhinorrhea. Negative for ear pain.   Eyes: Negative for discharge.  Respiratory: Positive for cough. Negative for shortness of breath and wheezing.   Cardiovascular: Negative for chest pain.       Objective:   Physical Exam Vitals signs and nursing note reviewed.  Constitutional:      Appearance: She is well-developed.  HENT:     Head: Normocephalic.     Nose: Nose normal.     Mouth/Throat:     Pharynx: No oropharyngeal exudate.  Neck:     Musculoskeletal: Neck supple.  Cardiovascular:     Rate and Rhythm: Normal rate.     Heart sounds: Normal heart sounds. No murmur.  Pulmonary:     Effort: Pulmonary effort is normal.     Breath sounds: Normal breath sounds. No wheezing.  Lymphadenopathy:     Cervical: No cervical adenopathy.  Skin:    General: Skin is warm and dry.           Assessment & Plan:  Influenza-the patient was diagnosed with influenza. Patient/family educated about the flu and warning signs to watch for. If difficulty breathing,  cyanosis, disorientation, or progressive worsening then immediately get rechecked at the ER. If progressive symptoms be certain to be rechecked. Supportive measures such as Tylenol/ibuprofen was discussed. No aspirin use in children. Patient was encouraged to try to rest up drink plenty of liquids patient was started on Tamiflu twice daily.  Warning signs  discussed.  I did let the patient know that with her sickness it can slow down metabolism of the pain medicine so therefore she may want to take a little less than normal over the next few days  Patient did send a my chart message that states that the pain medicine was doing well for her she is requesting that 2 additional prescriptions of this would be sent in-we will go ahead and do that she will do regular follow-up for pain management-these were sent and should be available to her at her pharmacy

## 2018-07-17 NOTE — Patient Instructions (Signed)

## 2018-07-21 ENCOUNTER — Ambulatory Visit (INDEPENDENT_AMBULATORY_CARE_PROVIDER_SITE_OTHER): Payer: 59 | Admitting: Physician Assistant

## 2018-07-21 DIAGNOSIS — M1711 Unilateral primary osteoarthritis, right knee: Secondary | ICD-10-CM | POA: Diagnosis not present

## 2018-07-21 NOTE — Progress Notes (Signed)
   Procedure Note  Patient: Melinda Moore             Date of Birth: 12-25-1970           MRN: [id number]             Visit Date: 07/21/2018  Procedures: Visit Diagnoses: No diagnosis found.  Large Joint Inj: R knee on 07/21/2018 8:50 AM Indications: pain Details: 25 G 1.5 in needle, medial approach  Arthrogram: No  Medications: 1.5 mL lidocaine 1 %; 16.8 mg Sodium Hyaluronate (Viscosup) 16.8 MG/2ML Aspirate: 0 mL Outcome: tolerated well, no immediate complications Procedure, treatment alternatives, risks and benefits explained, specific risks discussed. Consent was given by the patient. Immediately prior to procedure a time out was called to verify the correct patient, procedure, equipment, support staff and site/side marked as required. Patient was prepped and draped in the usual sterile fashion.

## 2018-07-22 MED ORDER — SODIUM HYALURONATE (VISCOSUP) 16.8 MG/2ML IX SOSY
16.8000 mg | PREFILLED_SYRINGE | INTRA_ARTICULAR | Status: AC | PRN
  Administered 2018-07-21: 16.8 mg via INTRA_ARTICULAR

## 2018-07-22 MED ORDER — LIDOCAINE HCL 1 % IJ SOLN
1.5000 mL | INTRAMUSCULAR | Status: AC | PRN
  Administered 2018-07-21: 1.5 mL

## 2018-07-28 ENCOUNTER — Ambulatory Visit: Payer: Medicare Other | Admitting: Physician Assistant

## 2018-07-28 MED ORDER — LIDOCAINE HCL 1 % IJ SOLN: 1.5000 mL | INTRAMUSCULAR | Status: DC | PRN

## 2018-07-28 MED ORDER — SODIUM HYALURONATE (VISCOSUP) 16.8 MG/2ML IX SOSY: 16.8000 mg | PREFILLED_SYRINGE | INTRA_ARTICULAR | Status: DC | PRN

## 2018-07-28 NOTE — Progress Notes (Deleted)
   Procedure Note  Patient: Melinda Moore             Date of Birth: [birth year]           MRN: [id number]             Visit Date: 07/28/2018  Procedures: Visit Diagnoses: Primary osteoarthritis of right knee - Plan: Large Joint Inj: R knee Gelsyn #1 Right knee joint injection B/B Large Joint Inj: R knee on 07/28/2018 9:46 AM Indications: pain Details: 25 G 1.5 in needle, medial approach  Arthrogram: No  Medications: 16.8 mg Sodium Hyaluronate (Viscosup) 16.8 MG/2ML; 1.5 mL lidocaine 1 % Aspirate: 0 mL Outcome: tolerated well, no immediate complications Procedure, treatment alternatives, risks and benefits explained, specific risks discussed. Consent was given by the patient. Immediately prior to procedure a time out was called to verify the correct patient, procedure, equipment, support staff and site/side marked as required. Patient was prepped and draped in the usual sterile fashion.     Patient tolerated the procedure well.  Sherron Ales, PA-C

## 2018-08-04 ENCOUNTER — Encounter: Payer: Medicare Other | Admitting: Physician Assistant

## 2018-08-04 NOTE — Progress Notes (Signed)
   Procedure Note  Patient: Melinda Moore             Date of Birth: [birth year]           MRN: [id number]             Visit Date: 08/04/2018  Procedures: Visit Diagnoses: Primary osteoarthritis of right knee Gelsyn #2 Right knee joint injection  Large Joint Inj: R knee on 08/04/2018 8:09 AM Indications: pain Details: 25 G 1.5 in needle, medial approach  Arthrogram: No  Medications: 1.5 mL lidocaine 1 %; 40 mg triamcinolone acetonide 40 MG/ML; 16.8 mg Sodium Hyaluronate (Viscosup) 16.8 MG/2ML Aspirate: 0 mL Outcome: tolerated well, no immediate complications Procedure, treatment alternatives, risks and benefits explained, specific risks discussed. Consent was given by the patient. Immediately prior to procedure a time out was called to verify the correct patient, procedure, equipment, support staff and site/side marked as required. Patient was prepped and draped in the usual sterile fashion.      Patient tolerated the procedure well.  Sherron Ales, PA-C This encounter was created in error - please disregard.

## 2018-08-05 MED ORDER — TRIAMCINOLONE ACETONIDE 40 MG/ML IJ SUSP
40.0000 mg | INTRAMUSCULAR | Status: AC | PRN
  Administered 2018-08-04: 40 mg via INTRA_ARTICULAR

## 2018-08-05 MED ORDER — LIDOCAINE HCL 1 % IJ SOLN
1.5000 mL | INTRAMUSCULAR | Status: AC | PRN
  Administered 2018-08-04: 1.5 mL

## 2018-08-05 MED ORDER — SODIUM HYALURONATE (VISCOSUP) 16.8 MG/2ML IX SOSY
16.8000 mg | PREFILLED_SYRINGE | INTRA_ARTICULAR | Status: AC | PRN
  Administered 2018-08-04: 16.8 mg via INTRA_ARTICULAR

## 2018-08-05 NOTE — Progress Notes (Signed)
This encounter was created in error - please disregard.

## 2018-08-05 NOTE — Addendum Note (Signed)
Addended by: Gearldine Bienenstock on: 08/05/2018 11:15 AM   Modules accepted: Level of Service, SmartSet

## 2018-08-11 ENCOUNTER — Ambulatory Visit (INDEPENDENT_AMBULATORY_CARE_PROVIDER_SITE_OTHER): Payer: Medicare Other | Admitting: Physician Assistant

## 2018-08-11 DIAGNOSIS — M1711 Unilateral primary osteoarthritis, right knee: Secondary | ICD-10-CM | POA: Diagnosis not present

## 2018-08-11 MED ORDER — LIDOCAINE HCL 1 % IJ SOLN
1.5000 mL | INTRAMUSCULAR | Status: AC | PRN
  Administered 2018-08-11: 1.5 mL

## 2018-08-11 MED ORDER — SODIUM HYALURONATE (VISCOSUP) 16.8 MG/2ML IX SOSY
16.8000 mg | PREFILLED_SYRINGE | INTRA_ARTICULAR | Status: AC | PRN
  Administered 2018-08-11: 16.8 mg via INTRA_ARTICULAR

## 2018-08-11 NOTE — Progress Notes (Signed)
   Procedure Note  Patient: Melinda Moore             Date of Birth: 02-11-1971           MRN: [id number]             Visit Date: 08/11/2018  Procedures: Visit Diagnoses: Primary osteoarthritis of right knee - Plan: Large Joint Inj: R knee Gelsyn #2 right knee B/B Large Joint Inj: R knee on 08/11/2018 11:19 AM Indications: pain Details: 25 G 1.5 in needle, medial approach  Arthrogram: No  Medications: 16.8 mg Sodium Hyaluronate (Viscosup) 16.8 MG/2ML; 1.5 mL lidocaine 1 % Aspirate: 0 mL Outcome: tolerated well, no immediate complications Procedure, treatment alternatives, risks and benefits explained, specific risks discussed. Consent was given by the patient. Immediately prior to procedure a time out was called to verify the correct patient, procedure, equipment, support staff and site/side marked as required. Patient was prepped and draped in the usual sterile fashion.     Patient tolerated the procedure well.  Sherron Ales, PA-C

## 2018-08-18 ENCOUNTER — Ambulatory Visit (INDEPENDENT_AMBULATORY_CARE_PROVIDER_SITE_OTHER): Payer: Medicare Other | Admitting: Physician Assistant

## 2018-08-18 DIAGNOSIS — M1711 Unilateral primary osteoarthritis, right knee: Secondary | ICD-10-CM | POA: Diagnosis not present

## 2018-08-18 MED ORDER — LIDOCAINE HCL 1 % IJ SOLN
1.5000 mL | INTRAMUSCULAR | Status: AC | PRN
  Administered 2018-08-18: 1.5 mL

## 2018-08-18 MED ORDER — SODIUM HYALURONATE (VISCOSUP) 16.8 MG/2ML IX SOSY
16.8000 mg | PREFILLED_SYRINGE | INTRA_ARTICULAR | Status: AC | PRN
  Administered 2018-08-18: 16.8 mg via INTRA_ARTICULAR

## 2018-08-18 NOTE — Progress Notes (Signed)
   Procedure Note  Patient: Melinda Moore             Date of Birth: 1971-04-30           MRN: [id number]             Visit Date: 08/18/2018  Procedures: Visit Diagnoses: Primary osteoarthritis of right knee - Plan: Large Joint Inj: R knee Gelsyn #3 Right knee joint injection B/B Large Joint Inj: R knee on 08/18/2018 8:50 AM Indications: pain Details: 25 G 1.5 in needle, medial approach  Arthrogram: No  Medications: 16.8 mg Sodium Hyaluronate (Viscosup) 16.8 MG/2ML; 1.5 mL lidocaine 1 % Aspirate: 0 mL Outcome: tolerated well, no immediate complications Procedure, treatment alternatives, risks and benefits explained, specific risks discussed. Consent was given by the patient. Immediately prior to procedure a time out was called to verify the correct patient, procedure, equipment, support staff and site/side marked as required. Patient was prepped and draped in the usual sterile fashion.      Patient tolerated the procedure well.  Sherron Ales, PA-C

## 2018-08-19 ENCOUNTER — Other Ambulatory Visit: Payer: Self-pay | Admitting: Family Medicine

## 2018-09-04 ENCOUNTER — Telehealth: Payer: Self-pay | Admitting: Family Medicine

## 2018-09-04 MED ORDER — LIDO-CAPSAICIN-MEN-METHYL SAL 0.5-0.035-5-20 % EX PTCH: MEDICATED_PATCH | CUTANEOUS | 2 refills | Status: DC

## 2018-09-04 NOTE — Telephone Encounter (Signed)
Pt returned call and verbalized understanding  

## 2018-09-04 NOTE — Telephone Encounter (Signed)
It is possible that lidocaine patch may not be covered by her insurance company any insurance companies do not Cover these Nonetheless please send in lidocaine patch apply 1 for 12 hours/day 1 box of 30 with 2 refills for her knee

## 2018-09-04 NOTE — Telephone Encounter (Signed)
Tried calling patient to get more information. No answer.

## 2018-09-04 NOTE — Telephone Encounter (Signed)
Patches sent in. Left message to to return call

## 2018-09-04 NOTE — Telephone Encounter (Signed)
Patient requesting lidocaine patches for knee pain. Advise.   Patient sates she has followed up with Dr.Scott in regards of pain management, informed patient of televisit information incase needed.    Pharmacy:  [facility] - [place], [place] - [address]

## 2018-09-05 ENCOUNTER — Other Ambulatory Visit: Payer: Self-pay | Admitting: Family Medicine

## 2018-09-05 MED ORDER — LIDOCAINE 5 % EX PTCH: MEDICATED_PATCH | CUTANEOUS | 2 refills | Status: DC

## 2018-09-22 ENCOUNTER — Encounter: Payer: Self-pay | Admitting: Family Medicine

## 2018-09-22 ENCOUNTER — Ambulatory Visit (INDEPENDENT_AMBULATORY_CARE_PROVIDER_SITE_OTHER): Payer: Medicare Other | Admitting: Family Medicine

## 2018-09-22 ENCOUNTER — Other Ambulatory Visit: Payer: Self-pay

## 2018-09-22 VITALS — Wt 241.0 lb

## 2018-09-22 DIAGNOSIS — E7849 Other hyperlipidemia: Secondary | ICD-10-CM

## 2018-09-22 DIAGNOSIS — G35 Multiple sclerosis: Secondary | ICD-10-CM

## 2018-09-22 DIAGNOSIS — I10 Essential (primary) hypertension: Secondary | ICD-10-CM | POA: Diagnosis not present

## 2018-09-22 DIAGNOSIS — Z79899 Other long term (current) drug therapy: Secondary | ICD-10-CM

## 2018-09-22 MED ORDER — HYDROMORPHONE HCL 4 MG PO TABS: ORAL_TABLET | ORAL | 0 refills | Status: DC

## 2018-09-22 NOTE — Progress Notes (Signed)
Subjective:    Patient ID: Melinda Moore, female    DOB: [birth year], 48 y.o.   MRN: [id number] Video and audio Patient at home We were at the office coronavirus outbreak  Very nice patient HPI This patient was seen today for chronic pain She states pain medicine does help her function without it she did have a very difficult time She is trying to the best you can and staying at home to avoid being out around others try to lessen her risk of coronavirus She understands not to do labs currently but she will do them come summertime Patient does use generic Ritalin as needed but has not had to use it recently because she is pretty much staying at home  The medication list was reviewed and updated.   -Compliance with medication: Hydromorphine 4 mg tablet  - Number patient states they take daily: 4  -when was the last dose patient took? This morning  The patient was advised the importance of maintaining medication and not using illegal substances with these.  Here for refills and follow up  The patient was educated that we can provide 3 monthly scripts for their medication, it is their responsibility to follow the instructions.  Side effects or complications from medications: none  Patient is aware that pain medications are meant to minimize the severity of the pain to allow their pain levels to improve to allow for better function. They are aware of that pain medications cannot totally remove their pain.  Due for UDT ( at least once per year) :   Virtual Visit via Video Note  I connected with Melinda Moore on 09/22/18 at  1:40 PM EDT by a video enabled telemedicine application and verified that I am speaking with the correct person using two identifiers.   I discussed the limitations of evaluation and management by telemedicine and the availability of in person appointments. The patient expressed understanding and agreed to proceed.  History of Present Illness:     Observations/Objective:   Assessment and Plan:   Follow Up Instructions:    I discussed the assessment and treatment plan with the patient. The patient was provided an opportunity to ask questions and all were answered. The patient agreed with the plan and demonstrated an understanding of the instructions.   The patient was advised to call back or seek an in-person evaluation if the symptoms worsen or if the condition fails to improve as anticipated.  I provided 15 minutes of non-face-to-face time during this encounter.   Marlowe [place], LPN        Review of Systems     Objective:   Physical Exam        Assessment & Plan:  The patient was seen in followup for chronic pain. A review over at their current pain status was discussed. Drug registry was checked. Prescriptions were given. Discussion was held regarding the importance of compliance with medication as well as pain medication contract.  Time for questions regarding pain management plan occurred. Importance of regular followup visits was discussed. Patient was informed that medication may cause drowsiness and should not be combined  with other medications/alcohol or street drugs. Patient was cautioned that medication could cause drowsiness. If the patient feels medication is causing altered alertness then do not drive or operate dangerous equipment.  Hyperlipidemia check lipid profile this summer  Blood pressure under good control current medications watch diet try to keep weight down  Morbid obesity patient understands try  to minimize food intake and stay physically active to keep weight down  MS under decent control recently follows through with specialist on a regular basis

## 2018-10-08 ENCOUNTER — Other Ambulatory Visit: Payer: Self-pay | Admitting: Family Medicine

## 2018-10-10 ENCOUNTER — Ambulatory Visit: Payer: Medicare Other | Admitting: Rheumatology

## 2018-11-18 ENCOUNTER — Telehealth: Payer: Self-pay | Admitting: Rheumatology

## 2018-11-18 NOTE — Telephone Encounter (Signed)
Patient left a message stating it is time for her next gel injections. Please advise patient.

## 2018-11-19 NOTE — Telephone Encounter (Signed)
Last gel injections completed 08/11/2018. Patient due again in September 2020.  Attempted to contact the patient and left message for patient to call the office.

## 2018-11-19 NOTE — Telephone Encounter (Signed)
Patient advised that her next gel injections are due in March 2020. Patient verbalized understanding and states she will call back in September 2020.

## 2018-11-20 ENCOUNTER — Other Ambulatory Visit: Payer: Self-pay | Admitting: Family Medicine

## 2018-12-15 ENCOUNTER — Other Ambulatory Visit: Payer: Self-pay | Admitting: Family Medicine

## 2018-12-19 ENCOUNTER — Telehealth: Payer: Self-pay | Admitting: Family Medicine

## 2018-12-19 ENCOUNTER — Other Ambulatory Visit: Payer: Self-pay | Admitting: Family Medicine

## 2018-12-19 NOTE — Telephone Encounter (Signed)
Pt is requesting refill on HYDROmorphone (DILAUDID) 4 MG tablet.   She has a med check 7/13 with Hoyle Sauer but she is out of medication and hoping a refill can be sent in today.   [place], [place]

## 2018-12-19 NOTE — Telephone Encounter (Signed)
Please advise. Thank you

## 2018-12-20 ENCOUNTER — Other Ambulatory Visit: Payer: Self-pay | Admitting: Family Medicine

## 2018-12-20 ENCOUNTER — Encounter: Payer: Self-pay | Admitting: Family Medicine

## 2018-12-20 MED ORDER — HYDROMORPHONE HCL 4 MG PO TABS: ORAL_TABLET | ORAL | 0 refills | Status: DC

## 2018-12-20 NOTE — Telephone Encounter (Signed)
The prescription was sent into [facility] on Saturday they are to notify the patient When the patient does her follow-up with Hoyle Sauer this coming week she will give 2 additional scripts She needs to keep follow-up visits every 3 months in order to keep getting her scripts

## 2018-12-22 ENCOUNTER — Other Ambulatory Visit: Payer: Self-pay

## 2018-12-22 ENCOUNTER — Ambulatory Visit: Payer: Medicare Other | Admitting: Nurse Practitioner

## 2018-12-22 ENCOUNTER — Other Ambulatory Visit: Payer: Self-pay | Admitting: *Deleted

## 2018-12-22 NOTE — Telephone Encounter (Signed)
Scott, patient called and cancelled her phone/virtual visit this afternoon. She was told that she must have a follow up with you before she receives further Rx for pain meds. Thanks.

## 2018-12-23 NOTE — Telephone Encounter (Signed)
When she called to cancel I told her she would need appt before further refills and she rescheduled while I had her on the phone for July 30th with dr scott. Pt is aware she cannot get refills if not seen.

## 2018-12-23 NOTE — Telephone Encounter (Signed)
Nurses Please connect with the patient Schedule her for next week or the week after for a pain management follow-up with myself this can be virtual

## 2018-12-23 NOTE — Progress Notes (Deleted)
Office Visit Note  Patient: Melinda Moore             Date of Birth: [birth year]           MRN: [id number]             PCP: Kathyrn Drown, MD Referring: Kathyrn Drown, MD Visit Date: 01/06/2019 Occupation: @GUAROCC @  Subjective:  No chief complaint on file.   History of Present Illness: Melinda Moore is a 48 y.o. female ***   Activities of Daily Living:  Patient reports morning stiffness for *** {minute/hour:19697}.   Patient {ACTIONS;DENIES/REPORTS:21021675::"Denies"} nocturnal pain.  Difficulty dressing/grooming: {ACTIONS;DENIES/REPORTS:21021675::"Denies"} Difficulty climbing stairs: {ACTIONS;DENIES/REPORTS:21021675::"Denies"} Difficulty getting out of chair: {ACTIONS;DENIES/REPORTS:21021675::"Denies"} Difficulty using hands for taps, buttons, cutlery, and/or writing: {ACTIONS;DENIES/REPORTS:21021675::"Denies"}  No Rheumatology ROS completed.   PMFS History:  Patient Active Problem List   Diagnosis Date Noted  . Right leg pain 03/12/2017  . Gait abnormality 03/12/2017  . Radiculopathy due to lumbar intervertebral disc disorder 09/21/2016  . Spondylosis without myelopathy or radiculopathy, lumbar region 09/21/2016  . History of anxiety 09/08/2016  . Primary osteoarthritis of right knee 08/30/2016  . Primary insomnia 08/30/2016  . Myofascial pain 08/30/2016  . Sciatica, right side 12/12/2015  . Right knee pain 12/12/2015  . Morbid obesity ([place]) 08/12/2015  . Lumbago 02/10/2014  . Chronic anxiety 07/08/2013  . Swelling of breast 04/08/2013  . Chronic pain syndrome 03/11/2013  . Port catheter in place 12/31/2012  . Unspecified constipation 10/01/2012  . Anemia 10/01/2012  . Rectal bleeding 10/01/2012  . Elevated alkaline phosphatase level 10/01/2012  . MS (multiple sclerosis) ([place]) 09/02/2012  . DVT (deep venous thrombosis) ([place]) 09/02/2012  . ANEMIA 04/12/2010  . NAUSEA WITH VOMITING 04/12/2010  . OTHER DYSPHAGIA 04/12/2010    Past Medical History:  Diagnosis  Date  . Anemia   . Anxiety   . Bell's palsy   . Depression   . DVT (deep venous thrombosis) ([place]) 09/02/2012   [place] on 02/13/2012 showed acute DVT in left calf veins and posterior tibial veins  . Fibromyalgia   . GERD (gastroesophageal reflux disease)   . History of blood transfusion   . HTN (hypertension)   . Leukopenia   . MS (multiple sclerosis) ([place]) 1997  . Neuropathic pain   . Peripheral edema   . Port catheter in place 12/31/2012    Family History  Problem Relation Age of Onset  . Heart disease Father   . Hyperlipidemia Father   . Congestive Heart Failure Father   . Hypertension Sister   . Other Paternal Uncle        Possible colon cancer, age greater than 47  . Cirrhosis Brother        etoh  . Healthy Mother   . Healthy Daughter   . Healthy Daughter    Past Surgical History:  Procedure Laterality Date  . ABDOMINAL HYSTERECTOMY    . CHOLECYSTECTOMY    . ESOPHAGOGASTRODUODENOSCOPY  2011   Dr. Gala Romney. Possible cervical esophageal with status post disruption with passage of Maloney dilator, glycol esophageal erythema, antral erosions. Biopsy from the stomach revealed minimal chronic inactive inflammation but negative for H. pylori  . LAPAROSCOPIC GASTRIC SLEEVE RESECTION N/A 04/17/2016   Procedure: LAPAROSCOPIC GASTRIC SLEEVE RESECTION WITH UPPER ENDOSCOPY;  Surgeon: Excell Seltzer, MD;  Location: [facility];  Service: General;  Laterality: N/A;  . TUBAL LIGATION  2001   Social History   Social History Narrative   Lives at home with husband and  2 daughters   Right handed   Takes 1/2 caffeine pill per day   Immunization History  Administered Date(s) Administered  . Influenza Split 04/06/2013  . Influenza,inj,Quad PF,6+ Mos 05/24/2015, 03/16/2016, 03/25/2017, 03/17/2018  . Influenza-Unspecified 03/10/2012, 03/31/2014  . Pneumococcal-Unspecified 03/21/2006  . Tdap 04/18/2018     Objective: Vital Signs: There were no vitals taken for this visit.   Physical Exam    Musculoskeletal Exam: ***  CDAI Exam: CDAI Score: - Patient Global: -; Provider Global: - Swollen: -; Tender: - Joint Exam   No joint exam has been documented for this visit   There is currently no information documented on the homunculus. Go to the Rheumatology activity and complete the homunculus joint exam.  Investigation: No additional findings.  Imaging: No results found.  Recent Labs: Lab Results  Component Value Date   WBC 4.7 01/24/2018   HGB 12.1 01/24/2018   PLT 364 01/24/2018   NA 139 01/24/2018   K 4.5 01/24/2018   CL 98 01/24/2018   CO2 27 01/24/2018   GLUCOSE 93 01/24/2018   BUN 11 01/24/2018   CREATININE 0.95 01/24/2018   BILITOT 0.4 01/24/2018   ALKPHOS 98 01/24/2018   AST 11 01/24/2018   ALT 14 01/24/2018   PROT 7.0 01/24/2018   ALBUMIN 4.3 01/24/2018   CALCIUM 9.2 01/24/2018   GFRAA 82 01/24/2018    Speciality Comments: No specialty comments available.  Procedures:  No procedures performed Allergies: Ambien [zolpidem tartrate] and Tape   Assessment / Plan:     Visit Diagnoses: No diagnosis found.  Orders: No orders of the defined types were placed in this encounter.  No orders of the defined types were placed in this encounter.   Face-to-face time spent with patient was *** minutes. Greater than 50% of time was spent in counseling and coordination of care.  Follow-Up Instructions: No follow-ups on file.   Ellen Henri, CMA  Note - This record has been created using Animal nutritionist.  Chart creation errors have been sought, but may not always  have been located. Such creation errors do not reflect on  the standard of medical care.

## 2018-12-24 DIAGNOSIS — M25561 Pain in right knee: Secondary | ICD-10-CM | POA: Diagnosis not present

## 2018-12-24 DIAGNOSIS — M1711 Unilateral primary osteoarthritis, right knee: Secondary | ICD-10-CM | POA: Diagnosis not present

## 2019-01-06 ENCOUNTER — Ambulatory Visit: Payer: Self-pay | Admitting: Rheumatology

## 2019-01-07 ENCOUNTER — Other Ambulatory Visit: Payer: Self-pay

## 2019-01-08 ENCOUNTER — Ambulatory Visit (INDEPENDENT_AMBULATORY_CARE_PROVIDER_SITE_OTHER): Payer: Medicare Other | Admitting: Family Medicine

## 2019-01-08 DIAGNOSIS — M1711 Unilateral primary osteoarthritis, right knee: Secondary | ICD-10-CM | POA: Diagnosis not present

## 2019-01-08 DIAGNOSIS — G894 Chronic pain syndrome: Secondary | ICD-10-CM

## 2019-01-08 DIAGNOSIS — G35 Multiple sclerosis: Secondary | ICD-10-CM

## 2019-01-08 DIAGNOSIS — M25561 Pain in right knee: Secondary | ICD-10-CM | POA: Diagnosis not present

## 2019-01-08 NOTE — Progress Notes (Signed)
Subjective:    Patient ID: Melinda Moore, female    DOB: June 23, 1970, 48 y.o.   MRN: [id number]  HPI  This patient was seen today for chronic pain  The medication list was reviewed and updated.   -Compliance with medication: dilaudid  - Number patient states they take daily: 4 a day  -when was the last dose patient took? today  The patient was advised the importance of maintaining medication and not using illegal substances with these.  Here for refills and follow up  The patient was educated that we can provide 3 monthly scripts for their medication, it is their responsibility to follow the instructions.  Side effects or complications from medications: none  Patient is aware that pain medications are meant to minimize the severity of the pain to allow their pain levels to improve to allow for better function. They are aware of that pain medications cannot totally remove their pain.  Virtual Visit via Video Note  I connected with Melinda Moore on 01/08/19 at  2:00 PM EDT by a video enabled telemedicine application and verified that I am speaking with the correct person using two identifiers.  Location: Patient: home Provider: office   I discussed the limitations of evaluation and management by telemedicine and the availability of in person appointments. The patient expressed understanding and agreed to proceed.  History of Present Illness:    Observations/Objective:   Assessment and Plan:   Follow Up Instructions:    I discussed the assessment and treatment plan with the patient. The patient was provided an opportunity to ask questions and all were answered. The patient agreed with the plan and demonstrated an understanding of the instructions.   The patient was advised to call back or seek an in-person evaluation if the symptoms worsen or if the condition fails to improve as anticipated.  I provided 15 minutes of non-face-to-face time during this encounter.           Review of Systems  Constitutional: Negative for activity change and appetite change.  HENT: Negative for congestion and rhinorrhea.   Respiratory: Negative for cough and shortness of breath.   Cardiovascular: Negative for chest pain and leg swelling.  Gastrointestinal: Negative for abdominal pain, nausea and vomiting.  Skin: Negative for color change.  Neurological: Negative for dizziness and weakness.  Psychiatric/Behavioral: Negative for agitation and confusion.       Objective:   Physical Exam   Patient had virtual visit Appears to be in no distress Atraumatic Neuro able to relate and oriented No apparent resp distress Color normal      Assessment & Plan:  Patient is very protective from coronavirus She does not want to come to the office currently Does not want to do lab work We will help her is much as possible via video  The patient was seen in followup for chronic pain. A review over at their current pain status was discussed. Drug registry was checked. Prescriptions were given. Discussion was held regarding the importance of compliance with medication as well as pain medication contract.  Time for questions regarding pain management plan occurred. Importance of regular followup visits was discussed. Patient was informed that medication may cause drowsiness and should not be combined  with other medications/alcohol or street drugs. Patient was cautioned that medication could cause drowsiness. If the patient feels medication is causing altered alertness then do not drive or operate dangerous equipment.  Overall doing well with her medicine updates will be  sent in she will do it in person visit hopefully in 3 months

## 2019-01-10 MED ORDER — HYDROMORPHONE HCL 4 MG PO TABS: ORAL_TABLET | ORAL | 0 refills | Status: DC

## 2019-01-12 ENCOUNTER — Other Ambulatory Visit (HOSPITAL_COMMUNITY): Payer: Self-pay | Admitting: Family Medicine

## 2019-01-12 ENCOUNTER — Telehealth: Payer: Self-pay | Admitting: Family Medicine

## 2019-01-12 DIAGNOSIS — Z1231 Encounter for screening mammogram for malignant neoplasm of breast: Secondary | ICD-10-CM

## 2019-01-12 NOTE — Telephone Encounter (Signed)
Patient scheduled office visit for evaluation.

## 2019-01-12 NOTE — Telephone Encounter (Signed)
I recommend a office visit this can be this week

## 2019-01-12 NOTE — Telephone Encounter (Signed)
Pt needs diagnostic mammogram ordered.   Her right breast is swollen and tender, she noticed it around Thursday last week and she states it is becoming more tender and she feels like it has a lot of fluid in it.

## 2019-01-14 ENCOUNTER — Other Ambulatory Visit: Payer: Self-pay | Admitting: Family Medicine

## 2019-01-16 ENCOUNTER — Telehealth: Payer: Self-pay | Admitting: *Deleted

## 2019-01-16 ENCOUNTER — Other Ambulatory Visit: Payer: Self-pay

## 2019-01-16 ENCOUNTER — Encounter: Payer: Self-pay | Admitting: Nurse Practitioner

## 2019-01-16 ENCOUNTER — Ambulatory Visit (INDEPENDENT_AMBULATORY_CARE_PROVIDER_SITE_OTHER): Payer: 59 | Admitting: Nurse Practitioner

## 2019-01-16 VITALS — BP 110/76 | Temp 98.4°F | Ht 69.0 in | Wt 236.0 lb

## 2019-01-16 DIAGNOSIS — N644 Mastodynia: Secondary | ICD-10-CM | POA: Diagnosis not present

## 2019-01-16 DIAGNOSIS — Z1239 Encounter for other screening for malignant neoplasm of breast: Secondary | ICD-10-CM | POA: Diagnosis not present

## 2019-01-16 DIAGNOSIS — N63 Unspecified lump in unspecified breast: Secondary | ICD-10-CM | POA: Diagnosis not present

## 2019-01-16 NOTE — Progress Notes (Signed)
   Subjective:    Patient ID: Melinda Moore, female    DOB: Mar 18, 1971, 48 y.o.   MRN: [id number]  HPI right breast pain and swelling. Started 3 days ago.  Presents for complaints of swelling and mild pain in the right breast that began 3 days ago.  Has improved some.  No fever.  No warmth.  No distinct mass has been noted.  No family history of breast cancer.    Review of Systems     Objective:   Physical Exam NAD.  Alert, oriented.  Lungs clear.  Heart regular rate rhythm.  On examination, the right breast is slightly enlarged compared to the left.  Left breast no distinct masses noted, no axillary lymphadenopathy.  Right breast some mild generalized density consistent with edema, no distinct mass noted.  No nipple discharge.  No axillary lymphadenopathy.  Minimally tender to palpation.  No erythema or warmth.       Assessment & Plan:   Problem List Items Addressed This Visit    None    Visit Diagnoses    Breast swelling    -  Primary   Relevant Orders   MM DIAG BREAST TOMO BILATERAL   US BREAST LTD UNI RIGHT INC AXILLA   US BREAST LTD UNI LEFT INC AXILLA   Breast pain in female       Relevant Orders   MM DIAG BREAST TOMO BILATERAL   US BREAST LTD UNI RIGHT INC AXILLA   US BREAST LTD UNI LEFT INC AXILLA   Screening for breast cancer       Relevant Orders   MM DIAG BREAST TOMO BILATERAL   US BREAST LTD UNI RIGHT INC AXILLA   US BREAST LTD UNI LEFT INC AXILLA     With no obvious signs of infection, diagnostic mammogram and ultrasound ordered.  Warning signs reviewed with patient.  Go to ED over the weekend if any worsening symptoms, call back next week if any problems.  Further follow-up based on test results.

## 2019-01-16 NOTE — Telephone Encounter (Signed)
Diagnostic mammo and breast u/s scheduled [facility] for Thursday august 13th. Register at 11:10 at the breast center. [address]. Phone [phone]. Bring insurance card, photo id, wear 2 piece clothing, come alone and wear a mask. Left message to return call to let pt know of appt. [facility] could not get pt in til end of the month.

## 2019-01-17 ENCOUNTER — Encounter: Payer: Self-pay | Admitting: Nurse Practitioner

## 2019-01-19 ENCOUNTER — Other Ambulatory Visit: Payer: Self-pay | Admitting: Family Medicine

## 2019-01-21 ENCOUNTER — Other Ambulatory Visit: Payer: 59

## 2019-01-21 NOTE — Telephone Encounter (Signed)
Pt contacted and is aware of appt date and time

## 2019-01-22 ENCOUNTER — Other Ambulatory Visit: Payer: 59

## 2019-01-26 ENCOUNTER — Encounter: Payer: Self-pay | Admitting: Family Medicine

## 2019-01-28 ENCOUNTER — Encounter: Payer: Self-pay | Admitting: Family Medicine

## 2019-01-29 ENCOUNTER — Other Ambulatory Visit: Payer: 59

## 2019-01-30 ENCOUNTER — Other Ambulatory Visit: Payer: Self-pay

## 2019-01-30 ENCOUNTER — Emergency Department (HOSPITAL_COMMUNITY): Admission: EM | Admit: 2019-01-30 | Discharge: 2019-01-30 | Discharge status: Against Medical Advice | Payer: 59

## 2019-01-31 ENCOUNTER — Emergency Department (HOSPITAL_COMMUNITY)
Admission: EM | Admit: 2019-01-31 | Discharge: 2019-01-31 | Disposition: A | Discharge status: Home / Self Care | Payer: 59 | Attending: Emergency Medicine | Admitting: Emergency Medicine

## 2019-01-31 ENCOUNTER — Encounter (HOSPITAL_COMMUNITY): Payer: Self-pay

## 2019-01-31 ENCOUNTER — Emergency Department (HOSPITAL_COMMUNITY): Payer: 59

## 2019-01-31 DIAGNOSIS — Z79899 Other long term (current) drug therapy: Secondary | ICD-10-CM | POA: Insufficient documentation

## 2019-01-31 DIAGNOSIS — I10 Essential (primary) hypertension: Secondary | ICD-10-CM | POA: Diagnosis not present

## 2019-01-31 DIAGNOSIS — Z87891 Personal history of nicotine dependence: Secondary | ICD-10-CM | POA: Diagnosis not present

## 2019-01-31 DIAGNOSIS — R109 Unspecified abdominal pain: Secondary | ICD-10-CM

## 2019-01-31 DIAGNOSIS — N390 Urinary tract infection, site not specified: Secondary | ICD-10-CM

## 2019-01-31 DIAGNOSIS — R1031 Right lower quadrant pain: Secondary | ICD-10-CM | POA: Diagnosis present

## 2019-01-31 LAB — BASIC METABOLIC PANEL
Anion gap: 10 (ref 5–15)
BUN: 8 mg/dL (ref 6–20)
CO2: 26 mmol/L (ref 22–32)
Calcium: 8.7 mg/dL — ABNORMAL LOW (ref 8.9–10.3)
Chloride: 101 mmol/L (ref 98–111)
Creatinine, Ser: 0.87 mg/dL (ref 0.44–1.00)
GFR calc Af Amer: >60 mL/min (ref >60)
GFR calc non Af Amer: >60 mL/min (ref >60)
Glucose, Bld: 100 mg/dL — ABNORMAL HIGH (ref 70–99)
Potassium: 3.8 mmol/L (ref 3.5–5.1)
Sodium: 137 mmol/L (ref 135–145)

## 2019-01-31 LAB — URINALYSIS, ROUTINE W REFLEX MICROSCOPIC
Bilirubin Urine: NEGATIVE
Glucose, UA: NEGATIVE mg/dL
Hgb urine dipstick: NEGATIVE
Ketones, ur: NEGATIVE mg/dL
Leukocytes,Ua: NEGATIVE
Nitrite: POSITIVE — AB
Protein, ur: NEGATIVE mg/dL
Specific Gravity, Urine: 1.011 (ref 1.005–1.030)
pH: 5 (ref 5.0–8.0)

## 2019-01-31 LAB — CBC
HCT: 40.9 % (ref 36.0–46.0)
Hemoglobin: 12.4 g/dL (ref 12.0–15.0)
MCH: 26.5 pg (ref 26.0–34.0)
MCHC: 30.3 g/dL (ref 30.0–36.0)
MCV: 87.4 fL (ref 80.0–100.0)
Platelets: 321 10*3/uL (ref 150–400)
RBC: 4.68 MIL/uL (ref 3.87–5.11)
RDW: 14.4 % (ref 11.5–15.5)
WBC: 4.9 10*3/uL (ref 4.0–10.5)
nRBC: 0 % (ref 0.0–0.2)

## 2019-01-31 MED ORDER — CEPHALEXIN 500 MG PO CAPS
500.0000 mg | ORAL_CAPSULE | Freq: Once | ORAL | Status: AC
  Administered 2019-01-31: 500 mg via ORAL
  Filled 2019-01-31: qty 1

## 2019-01-31 MED ORDER — ONDANSETRON 4 MG PO TBDP
4.0000 mg | ORAL_TABLET | Freq: Once | ORAL | Status: AC
  Administered 2019-01-31: 4 mg via ORAL
  Filled 2019-01-31: qty 1

## 2019-01-31 MED ORDER — CEPHALEXIN 500 MG PO CAPS: 500.0000 mg | ORAL_CAPSULE | Freq: Four times a day (QID) | ORAL | 0 refills | Status: DC

## 2019-01-31 MED ORDER — ONDANSETRON HCL 4 MG PO TABS: 4.0000 mg | ORAL_TABLET | Freq: Four times a day (QID) | ORAL | 0 refills | Status: DC

## 2019-01-31 NOTE — ED Triage Notes (Signed)
Pt believes she has a kidney infection. Has right sided flank pain that started 2 weeks ago. Not able to keep any fluids down and has vomited 4 times since yesterday

## 2019-01-31 NOTE — Discharge Instructions (Addendum)
Take the antibiotic as directed until its finished.  Drink plenty of water or cranberry juice.  Follow-up with your primary doctor next week for recheck.  Return to the ER for any worsening symptoms such as increasing pain, fever, or persistent vomiting.

## 2019-01-31 NOTE — ED Provider Notes (Signed)
[facility] EMERGENCY DEPARTMENT Provider Note   CSN: [id number] Arrival date & time: 01/31/19  7253     History   Chief Complaint Chief Complaint  Patient presents with   Flank Pain    HPI Melinda Moore is a 48 y.o. female.     HPI   Melinda Moore is a 48 y.o. female who presents to the Emergency Department complaining of right-sided flank pain that started 2 weeks ago.  She describes a constant pain that radiates from her right flank into her right lower abdomen and into her right thigh.  Pain is been associated with nausea and vomiting since yesterday.  She reports multiple episodes of vomiting and has been unable to keep down fluids.  She also reports some urinary hesitancy and believes she may have a kidney infection.  She has an appointment with her PCP for Monday, but came to the ER today due to continued pain.  She denies fever, chills, chest pain or shortness of breath.  No numbness or weakness of the lower extremities or hx of injury. Pain is not reproduced with movement    Past Medical History:  Diagnosis Date   Anemia    Anxiety    Bell's palsy    Depression    DVT (deep venous thrombosis) ([place]) 09/02/2012   [place] on 02/13/2012 showed acute DVT in left calf veins and posterior tibial veins   Fibromyalgia    GERD (gastroesophageal reflux disease)    History of blood transfusion    HTN (hypertension)    Leukopenia    MS (multiple sclerosis) ([place]) 1997   Neuropathic pain    Peripheral edema    Port catheter in place 12/31/2012    Patient Active Problem List   Diagnosis Date Noted   Right leg pain 03/12/2017   Gait abnormality 03/12/2017   Radiculopathy due to lumbar intervertebral disc disorder 09/21/2016   Spondylosis without myelopathy or radiculopathy, lumbar region 09/21/2016   History of anxiety 09/08/2016   Primary osteoarthritis of right knee 08/30/2016   Primary insomnia 08/30/2016   Myofascial pain 08/30/2016   Sciatica, right side  12/12/2015   Right knee pain 12/12/2015   Morbid obesity ([place]) 08/12/2015   Lumbago 02/10/2014   Chronic anxiety 07/08/2013   Swelling of breast 04/08/2013   Chronic pain syndrome 03/11/2013   Port catheter in place 12/31/2012   Unspecified constipation 10/01/2012   Anemia 10/01/2012   Rectal bleeding 10/01/2012   Elevated alkaline phosphatase level 10/01/2012   MS (multiple sclerosis) ([place]) 09/02/2012   DVT (deep venous thrombosis) ([place]) 09/02/2012   ANEMIA 04/12/2010   NAUSEA WITH VOMITING 04/12/2010   OTHER DYSPHAGIA 04/12/2010    Past Surgical History:  Procedure Laterality Date   ABDOMINAL HYSTERECTOMY     CHOLECYSTECTOMY     ESOPHAGOGASTRODUODENOSCOPY  2011   Dr. Gala Romney. Possible cervical esophageal with status post disruption with passage of Maloney dilator, glycol esophageal erythema, antral erosions. Biopsy from the stomach revealed minimal chronic inactive inflammation but negative for H. pylori   LAPAROSCOPIC GASTRIC SLEEVE RESECTION N/A 04/17/2016   Procedure: LAPAROSCOPIC GASTRIC SLEEVE RESECTION WITH UPPER ENDOSCOPY;  Surgeon: Excell Seltzer, MD;  Location: [facility] ORS;  Service: General;  Laterality: N/A;   TUBAL LIGATION  2001     OB History   No obstetric history on file.      Home Medications    Prior to Admission medications   Medication Sig Start Date End Date Taking? Authorizing Provider  aspirin EC 81  MG tablet Take 81 mg by mouth daily as needed for moderate pain.    [provider]  B Complex-C (SUPER B COMPLEX PO) Take 1 tablet by mouth daily.    [provider]  Bioflavonoid Products (BIOFLEX) TABS Take 2 tablets by mouth daily.    [provider]  buPROPion (WELLBUTRIN SR) 150 MG 12 hr tablet TAKE ONE (1) TABLET BY MOUTH 3 TIMES DAILY Patient not taking: Reported on 01/16/2019 12/15/18   Babs Sciara, MD  carbamazepine (TEGRETOL XR) 200 MG 12 hr tablet TAKE ONE TABLET BY MOUTH THREE TIMES DAILY AS  NEEDED 08/19/18   Babs Sciara, MD  cholecalciferol (VITAMIN D) 1000 units tablet Take 1,000 Units by mouth daily.    [provider]  Diclofenac Sodium 3 % GEL Apply 0.5 grams to 3 large joints twice daily as needed.  Maximum daily dose of 8 grams. 04/11/18   Pollyann Savoy, MD  Fingolimod HCl (GILENYA) 0.5 MG CAPS Take 1 capsule by mouth daily.      [provider]  gabapentin (NEURONTIN) 300 MG capsule Take 1 capsule (300 mg total) by mouth 3 (three) times daily. Patient taking differently: Take 1,200 mg by mouth 3 (three) times daily. States 300mg  tabs-  Takes 4 tab in am, 4 at lunch, 4 at bedtime 05/24/15   Luking, Scott A, MD  HYDROmorphone (DILAUDID) 4 MG tablet Take 1 tablet (4 mg total) by mouth every 6 (six) hours as needed for severe pain. 07/16/18   Babs Sciara, MD  HYDROmorphone (DILAUDID) 4 MG tablet 1/2 to 1 q 6 hours prn pain 01/10/19   Babs Sciara, MD  HYDROmorphone (DILAUDID) 4 MG tablet 1/2-1 q 6 hours prn pain 01/10/19   Babs Sciara, MD  HYDROmorphone (DILAUDID) 4 MG tablet 1/2 to 1 q 6 hours prn pain stop oxycodone 01/10/19   Luking, Jonna Coup, MD  Lido-Capsaicin-Men-Methyl Sal 0.5-0.035-5-20 % PTCH Apply one patch to knee for 12 hr/day 09/04/18   Babs Sciara, MD  lidocaine (LIDODERM) 5 % Apply one patch for 12 hours per day. Remove & Discard patch within 12 hours or as directed by MD 09/05/18   Babs Sciara, MD  LINZESS 145 MCG CAPS capsule TAKE ONE CAPSULE BY MOUTH DAILY 01/14/19   Babs Sciara, MD  methylphenidate (RITALIN) 10 MG tablet Take 1 tablet (10 mg total) by mouth 2 (two) times daily. 03/18/18   Babs Sciara, MD  Multiple Vitamins with FA (FOLIKA-V) 1 MG TABS Take 1 tablet by mouth 2 (two) times daily. 02/01/17   Babs Sciara, MD  Multiple Vitamins-Minerals (EMERGEN-C IMMUNE) PACK Take 1 packet by mouth daily as needed (immune support).    [provider]  ondansetron (ZOFRAN) 8 MG tablet Take 1 tablet (8 mg total) by mouth  every 8 (eight) hours as needed for nausea. 07/17/18   Babs Sciara, MD  Polyethylene Glycol 400 (BLINK TEARS) 0.25 % SOLN Place 1 drop into the right eye 2 (two) times daily as needed (dry eyes).     [provider]  potassium chloride SA (K-DUR) 20 MEQ tablet TAKE ONE TABLET BY MOUTH TWICE A DAY 01/19/19   Babs Sciara, MD  sertraline (ZOLOFT) 50 MG tablet TAKE ONE (1) TABLET BY MOUTH EVERY DAY 10/08/18   Babs Sciara, MD  torsemide (DEMADEX) 20 MG tablet TAKE TWO TABLETS BY MOUTH IN THE [facility] ONE TABLET AT NOON IF NEEDED 11/21/18  Babs SciaraLuking, Scott A, MD  vitamin E 1000 UNIT capsule Take 1,000 Units by mouth daily.    [provider]    Family History Family History  Problem Relation Age of Onset   Heart disease Father    Hyperlipidemia Father    Congestive Heart Failure Father    Hypertension Sister    Other Paternal Uncle        Possible colon cancer, age greater than 4060   Cirrhosis Brother        etoh   Healthy Mother    Healthy Daughter    Healthy Daughter     Social History Social History   Tobacco Use   Smoking status: Former Smoker    Types: Cigarettes    Quit date: 1990    Years since quitting: 30.6   Smokeless tobacco: Never Used  Substance Use Topics   Alcohol use: No   Drug use: No     Allergies   Ambien [zolpidem tartrate] and Tape   Review of Systems Review of Systems  Constitutional: Positive for appetite change. Negative for chills and fever.  Respiratory: Negative for shortness of breath.   Cardiovascular: Negative for chest pain.  Gastrointestinal: Positive for abdominal pain, nausea and vomiting.  Genitourinary: Positive for dysuria and flank pain. Negative for decreased urine volume, difficulty urinating, vaginal bleeding and vaginal discharge.  Skin: Negative for rash.  Neurological: Negative for dizziness, weakness and numbness.     Physical Exam Updated Vital Signs BP 133/83 (BP Location: Right Arm)     Pulse (!) 114    Temp 98.2 F (36.8 C) (Oral)    Resp 12    Ht 5\' 9"  (1.753 m)    Wt 109.3 kg    SpO2 99%    BMI 35.59 kg/m   Physical Exam Constitutional:      Appearance: Normal appearance. She is not toxic-appearing.  HENT:     Mouth/Throat:     Mouth: Mucous membranes are moist.     Pharynx: Oropharynx is clear.  Neck:     Musculoskeletal: Normal range of motion.  Cardiovascular:     Rate and Rhythm: Regular rhythm. Tachycardia present.     Pulses: Normal pulses.  Pulmonary:     Effort: Pulmonary effort is normal.     Breath sounds: Normal breath sounds.  Chest:     Chest wall: No tenderness.  Abdominal:     General: There is no distension.     Palpations: Abdomen is soft. There is no mass.     Tenderness: There is right CVA tenderness. There is no guarding.  Musculoskeletal: Normal range of motion.     Right lower leg: No edema.     Left lower leg: No edema.     Comments: Neg SLR bilaterally.    Skin:    General: Skin is warm.     Capillary Refill: Capillary refill takes less than 2 seconds.     Findings: No rash.  Neurological:     General: No focal deficit present.     Mental Status: She is alert.      ED Treatments / Results  Labs (all labs ordered are listed, but only abnormal results are displayed) Labs Reviewed  BASIC METABOLIC PANEL - Abnormal; Notable for the following components:      Result Value   Glucose, Bld 100 (*)    Calcium 8.7 (*)    All other components within normal limits  URINALYSIS, ROUTINE W REFLEX MICROSCOPIC - Abnormal;  Notable for the following components:   Color, Urine AMBER (*)    APPearance HAZY (*)    Nitrite POSITIVE (*)    Bacteria, UA MANY (*)    All other components within normal limits  URINE CULTURE  CBC    EKG None  Radiology Ct Renal Stone Study  Result Date: 01/31/2019 CLINICAL DATA:  Bilateral flank pain and vomiting. EXAM: CT ABDOMEN AND PELVIS WITHOUT CONTRAST TECHNIQUE: Multidetector CT imaging of the  abdomen and pelvis was performed following the standard protocol without IV contrast. COMPARISON:  05/21/2007 FINDINGS: Lower chest: Unremarkable. Hepatobiliary: No focal abnormality in the liver on this study without intravenous contrast. Gallbladder is surgically absent. No intrahepatic or extrahepatic biliary dilation. Pancreas: No focal mass lesion. No dilatation of the main duct. No intraparenchymal cyst. No peripancreatic edema. Spleen: No splenomegaly. No focal mass lesion. Adrenals/Urinary Tract: No adrenal nodule or mass. Kidneys unremarkable. No evidence for hydroureter. The urinary bladder appears normal for the degree of distention. Stomach/Bowel: Status post sleeve gastrectomy with small hiatal hernia. Duodenum is normally positioned as is the ligament of Treitz. No small bowel wall thickening. No small bowel dilatation. The terminal ileum is normal. The appendix is normal. No gross colonic mass. No colonic wall thickening. Elongated redundant sigmoid colon extends up anterior to the liver. Large stool volume noted in the distal sigmoid colon and rectum. Vascular/Lymphatic: No abdominal aortic aneurysm. No abdominal aortic atherosclerotic calcification. There is no gastrohepatic or hepatoduodenal ligament lymphadenopathy. No intraperitoneal or retroperitoneal lymphadenopathy. No pelvic sidewall lymphadenopathy. Reproductive: The uterus is surgically absent. There is no adnexal mass. Other: No intraperitoneal free fluid. Musculoskeletal: No worrisome lytic or sclerotic osseous abnormality. IMPRESSION: 1. No evidence for urinary stone disease. No secondary changes in either kidney or ureter. 2. 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 3. Prominent stool volume in the sigmoid colon and rectum. Electronically Signed   By: Kennith CenterEric  Mansell M.D.   On: 01/31/2019 15:31    Procedures Procedures (including critical care time)  Medications Ordered in ED Medications  ondansetron  (ZOFRAN-ODT) disintegrating tablet 4 mg (4 mg Oral Given 01/31/19 1017)     Initial Impression / Assessment and Plan / ED Course  I have reviewed the triage vital signs and the nursing notes.  Pertinent labs & imaging results that were available during my care of the patient were reviewed by me and considered in my medical decision making (see chart for details).        Pt with right flank pain and dysuria.  She is non-toxic appearing.  No concerning sx's for urosepsis.  No fever or vomiting.  Previous tachycardia improved after oral fluid challenge.  CT renal stone study is neg for urinary obstruction.  Possible developing pyelonephritis.  Urine culture pending. Pt reports feeling better.  Appears appropriate for d/c home.  Strict return precautions discussed.   Final Clinical Impressions(s) / ED Diagnoses   Final diagnoses:  Urinary tract infection without hematuria, site unspecified  Right flank pain    ED Discharge Orders    None       Rosey Bathriplett, Korryn Pancoast, PA-C 02/01/19 2016    Bethann BerkshireZammit, Joseph, MD 02/02/19 1623

## 2019-01-31 NOTE — ED Notes (Signed)
Pt attempting to get urine sample at present.

## 2019-02-01 NOTE — Telephone Encounter (Signed)
Hi Anna Based on safety protocols, 4-1/2 tablets would be the maximum that we could do per day.  Above that goes beyond my guidelines and would require pain management consultation. Please let me know what you would like to do. If we do increase it to 4-1/2 tablets/day I would like to know how many tablets you have left on your current prescription. I look forward to your response thank you

## 2019-02-02 ENCOUNTER — Ambulatory Visit: Payer: Medicare Other | Admitting: Family Medicine

## 2019-02-02 ENCOUNTER — Other Ambulatory Visit: Payer: Self-pay

## 2019-02-03 LAB — URINE CULTURE: Culture: >=100000 — AB

## 2019-02-04 ENCOUNTER — Telehealth: Payer: Self-pay | Admitting: Emergency Medicine

## 2019-02-04 DIAGNOSIS — M1711 Unilateral primary osteoarthritis, right knee: Secondary | ICD-10-CM | POA: Diagnosis not present

## 2019-02-04 DIAGNOSIS — M25561 Pain in right knee: Secondary | ICD-10-CM | POA: Diagnosis not present

## 2019-02-04 NOTE — Telephone Encounter (Signed)
Post ED Visit - Positive Culture Follow-up: Successful Patient Follow-Up  Culture assessed and recommendations reviewed by:  []  Elenor Quinones, Pharm.D. []  Heide Guile, Pharm.D., BCPS AQ-ID []  Parks Neptune, Pharm.D., BCPS []  Alycia Rossetti, Pharm.D., BCPS []  [place], [place].D., BCPS, AAHIVP []  Legrand Como, Pharm.D., BCPS, AAHIVP []  Salome Arnt, PharmD, BCPS []  Johnnette Gourd, PharmD, BCPS []  Hughes Better, PharmD, BCPS []  Leeroy Cha, PharmD  Positive urine culture  []  Patient discharged without antimicrobial prescription and treatment is now indicated [x]  Organism is resistant to prescribed ED discharge antimicrobial []  Patient with positive blood cultures  Changes discussed with ED provider: Martinique Robinson PA New antibiotic prescription stop keflex, start ciprofloxacin 500mg  po twice daily x 7 days   Attempting to contact patient   Hazle Nordmann 02/04/2019, 1:04 PM

## 2019-02-04 NOTE — Progress Notes (Signed)
ED Antimicrobial Stewardship Positive Culture Follow Up   Melinda Moore is an 48 year old female who presented to [facility] on 8/22 with a chief complaint of right sided flank pain for 2 weeks with associated nausea and vomiting   >100,000 colonies of ESBL E.coli Resistant to cefazolin Resistant to Bactrim  Sensitive to Ciprofloxacin   [x]  Treated with cefphalexin, organism resistant to prescribed antimicrobial  New antibiotic prescription: Ciprofloxacin 500mg  by mouth twice daily for 7 days  ED Provider: Martinique Robinson PA-C   Nicoletta Dress, PharmD PGY2 Infectious Disease Pharmacy Resident  Clinical Pharmacist Monday - Friday phone -  [phone] Saturday - Sunday phone - [phone]

## 2019-02-05 DIAGNOSIS — M25561 Pain in right knee: Secondary | ICD-10-CM | POA: Diagnosis not present

## 2019-02-05 DIAGNOSIS — R262 Difficulty in walking, not elsewhere classified: Secondary | ICD-10-CM | POA: Diagnosis not present

## 2019-02-05 DIAGNOSIS — M1711 Unilateral primary osteoarthritis, right knee: Secondary | ICD-10-CM | POA: Diagnosis not present

## 2019-02-06 ENCOUNTER — Ambulatory Visit (INDEPENDENT_AMBULATORY_CARE_PROVIDER_SITE_OTHER): Payer: 59 | Admitting: Family Medicine

## 2019-02-06 DIAGNOSIS — N390 Urinary tract infection, site not specified: Secondary | ICD-10-CM

## 2019-02-06 DIAGNOSIS — G894 Chronic pain syndrome: Secondary | ICD-10-CM

## 2019-02-06 DIAGNOSIS — K5909 Other constipation: Secondary | ICD-10-CM

## 2019-02-06 NOTE — Progress Notes (Signed)
Subjective:    Patient ID: Melinda Moore, female    DOB: [birth year], 48 y.o.   MRN: [id number]  HPI Frequent UTIs recent culture showed that the current antibiotic may not take care of it she denies any type of high fever chills sweats back pains nausea vomiting she relates severe constipation She has had constipation for the past several years especially since having MS and also having chronic pain she uses Linzess but it does not seem to be helping enough Patient calls for a follow up on ER visit for UTI. Patient states she is doing much better but is now having severe constipation. Patient states she takes linzess for constipation but states it not helping and she thinks she needs something stronger  Virtual Visit via Video Note  I connected with Melinda Moore on 02/06/19 at  1:10 PM EDT by a video enabled telemedicine application and verified that I am speaking with the correct person using two identifiers.  Location: Patient: home Provider: office   I discussed the limitations of evaluation and management by telemedicine and the availability of in person appointments. The patient expressed understanding and agreed to proceed.  History of Present Illness:    Observations/Objective:   Assessment and Plan:   Follow Up Instructions:    I discussed the assessment and treatment plan with the patient. The patient was provided an opportunity to ask questions and all were answered. The patient agreed with the plan and demonstrated an understanding of the instructions.   The patient was advised to call back or seek an in-person evaluation if the symptoms worsen or if the condition fails to improve as anticipated.  I provided 25 minutes of non-face-to-face time during this encounter.    Review of Systems  Constitutional: Negative for activity change and appetite change.  HENT: Negative for congestion and rhinorrhea.   Respiratory: Negative for cough and shortness of breath.    Cardiovascular: Negative for chest pain and leg swelling.  Gastrointestinal: Negative for abdominal pain, nausea and vomiting.  Musculoskeletal: Positive for back pain and myalgias. Negative for arthralgias.  Skin: Negative for color change.  Neurological: Negative for dizziness and weakness.  Psychiatric/Behavioral: Negative for agitation and confusion.       Objective:   Physical Exam Patient had virtual visit Appears to be in no distress Atraumatic Neuro able to relate and oriented No apparent resp distress Color normal        Assessment & Plan:  Severe constipation probably related to the pain medication patient denies any vomiting has some slight nausea she has been dealing with moderate constipation due to pain medicine for a long time recently got worse with being on antibiotics  We will increase the dose of Linzess 290 mcg daily patient will let us know if is not helping Mag citrate twice daily for the next few days MiraLAX 1 capful in 8 ounces daily until bowel movement occurs Get fiber and water into the diet If vomiting or severe pain notify us  Chronic pain with MS not doing well on 4 tablets a day we will bump up the dose to 4-1/2 tablets a day if that does not dramatically help her pain over the next few weeks she will let us know and we will help set her up with pain management  Recent UTI not cured by recent antibiotic need to go with Cipro 250 mg twice daily for 5 days  If high fevers or worse follow-up  25 minutes was  spent with the patient.  This statement verifies that 25 minutes was indeed spent with the patient.  More than 50% of this visit-total duration of the visit-was spent in counseling and coordination of care. The issues that the patient came in for today as reflected in the diagnosis (s) please refer to documentation for further details.  I did encourage the patient to see gastroenterology for further evaluation patient would like to hold off on  this currently until she sees how she is doing she will let us know

## 2019-02-07 MED ORDER — HYDROMORPHONE HCL 4 MG PO TABS: ORAL_TABLET | ORAL | 0 refills | Status: DC

## 2019-02-07 MED ORDER — CIPROFLOXACIN HCL 250 MG PO TABS: 250.0000 mg | ORAL_TABLET | Freq: Two times a day (BID) | ORAL | 0 refills | Status: DC

## 2019-02-07 MED ORDER — LINACLOTIDE 290 MCG PO CAPS: 290.0000 ug | ORAL_CAPSULE | Freq: Every day | ORAL | 4 refills | Status: DC

## 2019-02-09 ENCOUNTER — Other Ambulatory Visit: Payer: 59

## 2019-02-17 ENCOUNTER — Other Ambulatory Visit: Payer: Self-pay | Admitting: Family Medicine

## 2019-02-18 ENCOUNTER — Other Ambulatory Visit: Payer: 59

## 2019-02-20 DIAGNOSIS — M25561 Pain in right knee: Secondary | ICD-10-CM | POA: Diagnosis not present

## 2019-02-24 ENCOUNTER — Encounter (HOSPITAL_COMMUNITY): Payer: Self-pay

## 2019-02-25 ENCOUNTER — Other Ambulatory Visit: Payer: 59

## 2019-02-25 ENCOUNTER — Telehealth: Payer: Self-pay | Admitting: Nurse Practitioner

## 2019-02-25 NOTE — Telephone Encounter (Signed)
We scheduled her for a diagnostic Mammogram in [place] and she can't make it and would like it to be changed to Portal she will take whatever the next appt they have.

## 2019-02-25 NOTE — Telephone Encounter (Signed)
Next available at [facility] March 17, 2019 at 10:20am. Patient notified and verbalized understanding.

## 2019-03-04 DIAGNOSIS — M1711 Unilateral primary osteoarthritis, right knee: Secondary | ICD-10-CM | POA: Diagnosis not present

## 2019-03-09 DIAGNOSIS — M25561 Pain in right knee: Secondary | ICD-10-CM | POA: Diagnosis not present

## 2019-03-09 DIAGNOSIS — M1711 Unilateral primary osteoarthritis, right knee: Secondary | ICD-10-CM | POA: Diagnosis not present

## 2019-03-17 ENCOUNTER — Other Ambulatory Visit: Payer: Self-pay

## 2019-03-17 ENCOUNTER — Ambulatory Visit (HOSPITAL_COMMUNITY): Payer: 59

## 2019-03-17 ENCOUNTER — Other Ambulatory Visit: Payer: Self-pay | Admitting: Family Medicine

## 2019-03-17 ENCOUNTER — Ambulatory Visit (HOSPITAL_COMMUNITY)
Admission: RE | Admit: 2019-03-17 | Discharge: 2019-03-17 | Disposition: A | Discharge status: Home / Self Care | Payer: 59 | Source: Ambulatory Visit | Attending: Nurse Practitioner | Admitting: Nurse Practitioner

## 2019-03-17 DIAGNOSIS — Z1239 Encounter for other screening for malignant neoplasm of breast: Secondary | ICD-10-CM | POA: Diagnosis present

## 2019-03-17 DIAGNOSIS — N644 Mastodynia: Secondary | ICD-10-CM | POA: Insufficient documentation

## 2019-03-17 DIAGNOSIS — N63 Unspecified lump in unspecified breast: Secondary | ICD-10-CM | POA: Diagnosis present

## 2019-03-20 ENCOUNTER — Other Ambulatory Visit: Payer: Self-pay

## 2019-03-20 ENCOUNTER — Ambulatory Visit (INDEPENDENT_AMBULATORY_CARE_PROVIDER_SITE_OTHER): Payer: Medicare Other | Admitting: Family Medicine

## 2019-03-20 DIAGNOSIS — G894 Chronic pain syndrome: Secondary | ICD-10-CM | POA: Diagnosis not present

## 2019-03-20 DIAGNOSIS — G35 Multiple sclerosis: Secondary | ICD-10-CM

## 2019-03-20 MED ORDER — HYDROMORPHONE HCL 4 MG PO TABS: ORAL_TABLET | ORAL | 0 refills | Status: DC

## 2019-03-20 MED ORDER — METHYLPHENIDATE HCL 10 MG PO TABS: 10.0000 mg | ORAL_TABLET | Freq: Two times a day (BID) | ORAL | 0 refills | Status: DC

## 2019-03-20 NOTE — Progress Notes (Signed)
   Subjective:    Patient ID: Melinda Moore, female    DOB: [birth year], 48 y.o.   MRN: [id number]  HPI This patient was seen today for chronic pain. Takes for right knee pain  The medication list was reviewed and updated.   -Compliance with medication: takes 4 and a half per day  - Number patient states they take daily: takes 4 and a half tablets  -when was the last dose patient took? yesterday  The patient was advised the importance of maintaining medication and not using illegal substances with these.  Here for refills and follow up  The patient was educated that we can provide 3 monthly scripts for their medication, it is their responsibility to follow the instructions.  Side effects or complications from medications: none  Patient is aware that pain medications are meant to minimize the severity of the pain to allow their pain levels to improve to allow for better function. They are aware of that pain medications cannot totally remove their pain.  Due for UDT ( at least once per year) : last one 12/24/17. Not able to do today since pt is doing a virtual visit.   This patient is seen orthopedic specialist who tried various measures nothing was successful but now again and try a nerve block to hopefully help possibly she will be able to come down some on her pain medicines  Her MS is stable she does follow with the specialist on a regular basis  She does have some ADD along with inertia issues and her medication does help her function better she has been on this through the specialist we are refilling it for her the Ritalin      Review of Systems  Constitutional: Negative for activity change, appetite change and fatigue.  HENT: Negative for congestion.   Respiratory: Negative for cough.   Cardiovascular: Negative for chest pain.  Gastrointestinal: Negative for abdominal pain.  Musculoskeletal: Positive for arthralgias and back pain.  Skin: Negative for color change.   Neurological: Negative for headaches.  Psychiatric/Behavioral: Negative for behavioral problems.       Objective:   Physical Exam  Patient had virtual visit Appears to be in no distress Atraumatic Neuro able to relate and oriented No apparent resp distress Color normal       Assessment & Plan:  The patient was seen in followup for chronic pain. A review over at their current pain status was discussed. Drug registry was checked. Prescriptions were given. Discussion was held regarding the importance of compliance with medication as well as pain medication contract.  Time for questions regarding pain management plan occurred. Importance of regular followup visits was discussed. Patient was informed that medication may cause drowsiness and should not be combined  with other medications/alcohol or street drugs. Patient was cautioned that medication could cause drowsiness. If the patient feels medication is causing altered alertness then do not drive or operate dangerous equipment.  Drug registry checked prescriptions were sent  Patient also has underlying issues with her MS and the Ritalin helps her with her focus and follow through was started by specialist she would like to have additional prescription sent in

## 2019-03-26 DIAGNOSIS — M25561 Pain in right knee: Secondary | ICD-10-CM | POA: Diagnosis not present

## 2019-04-13 DIAGNOSIS — M25561 Pain in right knee: Secondary | ICD-10-CM | POA: Diagnosis not present

## 2019-04-13 DIAGNOSIS — M1711 Unilateral primary osteoarthritis, right knee: Secondary | ICD-10-CM | POA: Diagnosis not present

## 2019-04-20 DIAGNOSIS — M1711 Unilateral primary osteoarthritis, right knee: Secondary | ICD-10-CM | POA: Diagnosis not present

## 2019-06-13 ENCOUNTER — Encounter (HOSPITAL_COMMUNITY): Payer: Self-pay | Admitting: Emergency Medicine

## 2019-06-13 ENCOUNTER — Other Ambulatory Visit: Payer: Self-pay

## 2019-06-13 ENCOUNTER — Emergency Department (HOSPITAL_COMMUNITY): Payer: 59

## 2019-06-13 ENCOUNTER — Emergency Department (HOSPITAL_COMMUNITY)
Admission: EM | Admit: 2019-06-13 | Discharge: 2019-06-13 | Disposition: A | Discharge status: Home / Self Care | Payer: 59 | Attending: Emergency Medicine | Admitting: Emergency Medicine

## 2019-06-13 DIAGNOSIS — Z79899 Other long term (current) drug therapy: Secondary | ICD-10-CM | POA: Diagnosis not present

## 2019-06-13 DIAGNOSIS — Y929 Unspecified place or not applicable: Secondary | ICD-10-CM | POA: Insufficient documentation

## 2019-06-13 DIAGNOSIS — Z7982 Long term (current) use of aspirin: Secondary | ICD-10-CM | POA: Insufficient documentation

## 2019-06-13 DIAGNOSIS — Z87891 Personal history of nicotine dependence: Secondary | ICD-10-CM | POA: Insufficient documentation

## 2019-06-13 DIAGNOSIS — I10 Essential (primary) hypertension: Secondary | ICD-10-CM | POA: Insufficient documentation

## 2019-06-13 DIAGNOSIS — S63601A Unspecified sprain of right thumb, initial encounter: Secondary | ICD-10-CM | POA: Diagnosis not present

## 2019-06-13 DIAGNOSIS — Y999 Unspecified external cause status: Secondary | ICD-10-CM | POA: Insufficient documentation

## 2019-06-13 DIAGNOSIS — Y9301 Activity, walking, marching and hiking: Secondary | ICD-10-CM | POA: Insufficient documentation

## 2019-06-13 DIAGNOSIS — W109XXA Fall (on) (from) unspecified stairs and steps, initial encounter: Secondary | ICD-10-CM | POA: Insufficient documentation

## 2019-06-13 DIAGNOSIS — S6991XA Unspecified injury of right wrist, hand and finger(s), initial encounter: Secondary | ICD-10-CM | POA: Diagnosis present

## 2019-06-13 MED ORDER — IBUPROFEN 800 MG PO TABS
800.0000 mg | ORAL_TABLET | Freq: Once | ORAL | Status: AC
  Administered 2019-06-13: 18:00:00 800 mg via ORAL
  Filled 2019-06-13: qty 1

## 2019-06-13 MED ORDER — IBUPROFEN 600 MG PO TABS: 600.0000 mg | ORAL_TABLET | Freq: Four times a day (QID) | ORAL | 0 refills | Status: DC | PRN

## 2019-06-13 NOTE — ED Notes (Signed)
Going to the grocery store and fell off of porch  Pain and swelling to thumb   Here for eval  Sensation intact

## 2019-06-13 NOTE — ED Triage Notes (Signed)
Pt states she fell off her last 2 steps of the porch and caught self with right hand. Pain in right thumb.

## 2019-06-13 NOTE — ED Provider Notes (Signed)
[facility] EMERGENCY DEPARTMENT Provider Note   CSN: [id number] Arrival date & time: 06/13/19  1517     History Chief Complaint  Patient presents with  . Finger Injury    Melinda Moore is a 49 y.o. female.  The history is provided by the patient. No language interpreter was used.       49 year old female with history of chronic pain syndrome, osteoarthritis, MS, presenting complaining of right hand pain.  Patient report a few hours ago she was walking down the steps, accidentally missed the last 2 steps and fell forward extending her right hand to break her fall.  She believes she may have hyperextended her right thumb from falling.  She denies hitting her head or loss of consciousness.  She does complain of 10 out of 10 pain throughout her right thumb with difficulty moving her thumb.  No complaints of numbness.  No wrist pain elbow pain or any other injury.  No specific treatment tried.  She is right-hand dominant.  She denies any precipitating symptoms prior to the fall.  Past Medical History:  Diagnosis Date  . Anemia   . Anxiety   . Bell's palsy   . Depression   . DVT (deep venous thrombosis) ([place]) 09/02/2012   [place] on 02/13/2012 showed acute DVT in left calf veins and posterior tibial veins  . Fibromyalgia   . GERD (gastroesophageal reflux disease)   . History of blood transfusion   . HTN (hypertension)   . Leukopenia   . MS (multiple sclerosis) ([place]) 1997  . Neuropathic pain   . Peripheral edema   . Port catheter in place 12/31/2012    Patient Active Problem List   Diagnosis Date Noted  . Right leg pain 03/12/2017  . Gait abnormality 03/12/2017  . Radiculopathy due to lumbar intervertebral disc disorder 09/21/2016  . Spondylosis without myelopathy or radiculopathy, lumbar region 09/21/2016  . History of anxiety 09/08/2016  . Primary osteoarthritis of right knee 08/30/2016  . Primary insomnia 08/30/2016  . Myofascial pain 08/30/2016  . Sciatica, right side 12/12/2015    . Right knee pain 12/12/2015  . Morbid obesity ([place]) 08/12/2015  . Lumbago 02/10/2014  . Chronic anxiety 07/08/2013  . Swelling of breast 04/08/2013  . Chronic pain syndrome 03/11/2013  . Port catheter in place 12/31/2012  . Unspecified constipation 10/01/2012  . Anemia 10/01/2012  . Rectal bleeding 10/01/2012  . Elevated alkaline phosphatase level 10/01/2012  . MS (multiple sclerosis) ([place]) 09/02/2012  . DVT (deep venous thrombosis) ([place]) 09/02/2012  . ANEMIA 04/12/2010  . NAUSEA WITH VOMITING 04/12/2010  . OTHER DYSPHAGIA 04/12/2010    Past Surgical History:  Procedure Laterality Date  . ABDOMINAL HYSTERECTOMY    . CHOLECYSTECTOMY    . ESOPHAGOGASTRODUODENOSCOPY  2011   Dr. Gala Romney. Possible cervical esophageal with status post disruption with passage of Maloney dilator, glycol esophageal erythema, antral erosions. Biopsy from the stomach revealed minimal chronic inactive inflammation but negative for H. pylori  . LAPAROSCOPIC GASTRIC SLEEVE RESECTION N/A 04/17/2016   Procedure: LAPAROSCOPIC GASTRIC SLEEVE RESECTION WITH UPPER ENDOSCOPY;  Surgeon: Excell Seltzer, MD;  Location: [facility] ORS;  Service: General;  Laterality: N/A;  . TUBAL LIGATION  2001     OB History   No obstetric history on file.     Family History  Problem Relation Age of Onset  . Heart disease Father   . Hyperlipidemia Father   . Congestive Heart Failure Father   . Hypertension Sister   . Other  Paternal Uncle        Possible colon cancer, age greater than 36  . Cirrhosis Brother        etoh  . Healthy Mother   . Healthy Daughter   . Healthy Daughter     Social History   Tobacco Use  . Smoking status: Former Smoker    Types: Cigarettes    Quit date: 1990    Years since quitting: 31.0  . Smokeless tobacco: Never Used  Substance Use Topics  . Alcohol use: No  . Drug use: No    Home Medications Prior to Admission medications   Medication Sig Start Date End Date Taking? Authorizing Provider   aspirin EC 81 MG tablet Take 81 mg by mouth daily as needed for moderate pain.    [provider]  B Complex-C (SUPER B COMPLEX PO) Take 1 tablet by mouth daily.    [provider]  Bioflavonoid Products (BIOFLEX) TABS Take 2 tablets by mouth daily.    [provider]  buPROPion (WELLBUTRIN SR) 150 MG 12 hr tablet TAKE ONE (1) TABLET BY MOUTH 3 TIMES DAILY 12/15/18   Babs Sciara, MD  carbamazepine (TEGRETOL XR) 200 MG 12 hr tablet TAKE ONE TABLET BY MOUTH THREE TIMES DAILY AS NEEDED 08/19/18   Babs Sciara, MD  cholecalciferol (VITAMIN D) 1000 units tablet Take 1,000 Units by mouth daily.    [provider]  Diclofenac Sodium 3 % GEL Apply 0.5 grams to 3 large joints twice daily as needed.  Maximum daily dose of 8 grams. 04/11/18   Pollyann Savoy, MD  Fingolimod HCl (GILENYA) 0.5 MG CAPS Take 1 capsule by mouth daily.      [provider]  gabapentin (NEURONTIN) 300 MG capsule Take 1 capsule (300 mg total) by mouth 3 (three) times daily. Patient taking differently: Take 1,200 mg by mouth 3 (three) times daily. States 300mg  tabs-  Takes 4 tab in am, 4 at lunch, 4 at bedtime 05/24/15   Luking, Scott A, MD  HYDROmorphone (DILAUDID) 4 MG tablet TAKE ONE TABLET EVERY 4 HOURS AS NEEDED FOR PAIN NO MORE THAN 4.5 TABLETS A DAY 03/20/19   05/20/19, MD  HYDROmorphone (DILAUDID) 4 MG tablet Take one tablet every 4 hours as needed for pain no more than 4.5 tablets a day 03/20/19   05/20/19, MD  HYDROmorphone (DILAUDID) 4 MG tablet Take one tablet every 4 hours as needed for pain. No more than 4.5 tablets a day 03/20/19   05/20/19, MD  Lido-Capsaicin-Men-Methyl Sal 0.5-0.035-5-20 % PTCH Apply one patch to knee for 12 hr/day 09/04/18   09/06/18, MD  lidocaine (LIDODERM) 5 % Apply one patch for 12 hours per day. Remove & Discard patch within 12 hours or as directed by MD 09/05/18   09/07/18, MD  linaclotide (LINZESS) 290 MCG CAPS  capsule Take 1 capsule (290 mcg total) by mouth daily. 02/07/19   02/09/19, MD  methylphenidate (RITALIN) 10 MG tablet Take 1 tablet (10 mg total) by mouth 2 (two) times daily. 03/20/19   05/20/19, MD  methylphenidate (RITALIN) 10 MG tablet Take 1 tablet (10 mg total) by mouth 2 (two) times daily. 03/20/19   05/20/19, MD  methylphenidate (RITALIN) 10 MG tablet Take 1 tablet (10 mg total) by mouth 2 (two) times daily. 03/20/19   05/20/19, MD  Multiple Vitamins with FA (FOLIKA-V) 1 MG TABS  Take 1 tablet by mouth 2 (two) times daily. 02/01/17   Babs Sciara, MD  Multiple Vitamins-Minerals (EMERGEN-C IMMUNE) PACK Take 1 packet by mouth daily as needed (immune support).    [provider]  ondansetron (ZOFRAN) 4 MG tablet Take 1 tablet (4 mg total) by mouth every 6 (six) hours. 01/31/19   Triplett, Tammy, PA-C  Polyethylene Glycol 400 (BLINK TEARS) 0.25 % SOLN Place 1 drop into the right eye 2 (two) times daily as needed (dry eyes).     [provider]  potassium chloride SA (K-DUR) 20 MEQ tablet TAKE ONE TABLET BY MOUTH TWICE A DAY 01/19/19   Babs Sciara, MD  sertraline (ZOLOFT) 50 MG tablet TAKE ONE (1) TABLET BY MOUTH EVERY DAY 02/17/19   Babs Sciara, MD  torsemide (DEMADEX) 20 MG tablet TAKE TWO TABLETS BY MOUTH IN THE [facility] ONE TABLET AT NOON IF NEEDED 11/21/18   Babs Sciara, MD  vitamin E 1000 UNIT capsule Take 1,000 Units by mouth daily.    [provider]    Allergies    Ambien [zolpidem tartrate] and Tape  Review of Systems   Review of Systems  Constitutional: Negative for fever.  Musculoskeletal: Positive for arthralgias.  Skin: Negative for wound.  Neurological: Negative for numbness.    Physical Exam Updated Vital Signs BP (!) 134/113 (BP Location: Left Arm)   Pulse 88   Temp 98 F (36.7 C) (Oral)   Resp 18   Ht 5\' 9"  (1.753 m)   Wt 100.2 kg   SpO2 100%   BMI 32.64 kg/m   Physical Exam Vitals and nursing  note reviewed.  Constitutional:      General: She is not in acute distress.    Appearance: She is well-developed.  HENT:     Head: Atraumatic.  Eyes:     Conjunctiva/sclera: Conjunctivae normal.  Musculoskeletal:        General: Tenderness (Right thumb: Minimal tenderness to palpation along the thumb at both MCP and IP joint without any obvious deformity swelling crepitus or bruising noted.  Decreased range of motion throughout both joint with poor effort.  No tenderness to snuffbox.  ) present.     Cervical back: Neck supple.  Skin:    Findings: No rash.  Neurological:     Mental Status: She is alert.     ED Results / Procedures / Treatments   Labs (all labs ordered are listed, but only abnormal results are displayed) Labs Reviewed - No data to display  EKG None  Radiology DG Hand Complete Right  Result Date: 06/13/2019 CLINICAL DATA:  Pt states she fell off her last 2 steps of the porch and caught self with right hand. Pain in right thumb. pain at proximal phalanx of right thumb EXAM: RIGHT HAND - COMPLETE 3+ VIEW COMPARISON:  None. FINDINGS: There is no evidence of fracture or dislocation. Normal mineralization. No focal bony lesion. Soft tissues are unremarkable. IMPRESSION: No acute bony abnormality in the right hand. Electronically Signed   By: 08/11/2019 M.D.   On: 06/13/2019 15:54    Procedures Procedures (including critical care time)  Medications Ordered in ED Medications  ibuprofen (ADVIL) tablet 800 mg (has no administration in time range)    ED Course  I have reviewed the triage vital signs and the nursing notes.  Pertinent labs & imaging results that were available during my care of the patient were reviewed by me and considered in my medical  decision making (see chart for details).    MDM Rules/Calculators/A&P                      BP (!) 134/113 (BP Location: Left Arm)   Pulse 88   Temp 98 F (36.7 C) (Oral)   Resp 18   Ht 5\' 9"  (1.753 m)    Wt 100.2 kg   SpO2 100%   BMI 32.64 kg/m   Final Clinical Impression(s) / ED Diagnoses Final diagnoses:  Sprain of right thumb, initial encounter    Rx / DC Orders ED Discharge Orders         Ordered    ibuprofen (ADVIL) 600 MG tablet  Every 6 hours PRN     06/13/19 1730         Patient complaining of right thumb pain from falling.  Likely I hyperextended thumb leading to a thumb sprain.  X-ray of the right hand is unremarkable.  Patient is neurovascularly intact.  No pain at anatomical snuffbox concerning for scaphoid fracture.  Rice therapy discussed.  Ace wrap applied.  Ibuprofen given.   08/11/19, PA-C 06/13/19 1731    08/11/19, MD 06/15/19 1504

## 2019-06-16 ENCOUNTER — Telehealth: Payer: Self-pay | Admitting: Family Medicine

## 2019-06-16 NOTE — Telephone Encounter (Signed)
PA needed for Hydromorphine 4 mg tablets. Attempted PA through Cover my meds; await decision

## 2019-06-17 NOTE — Telephone Encounter (Signed)
PA for Hydromorphone 4 mg has been approved until 06/15/20. Pt has been contacted and is aware.

## 2019-06-18 ENCOUNTER — Encounter: Payer: Self-pay | Admitting: Family Medicine

## 2019-06-18 ENCOUNTER — Other Ambulatory Visit: Payer: Self-pay

## 2019-06-18 ENCOUNTER — Ambulatory Visit (INDEPENDENT_AMBULATORY_CARE_PROVIDER_SITE_OTHER): Payer: Medicare Other | Admitting: Family Medicine

## 2019-06-18 DIAGNOSIS — G894 Chronic pain syndrome: Secondary | ICD-10-CM | POA: Diagnosis not present

## 2019-06-18 DIAGNOSIS — M5116 Intervertebral disc disorders with radiculopathy, lumbar region: Secondary | ICD-10-CM | POA: Diagnosis not present

## 2019-06-18 DIAGNOSIS — G35 Multiple sclerosis: Secondary | ICD-10-CM

## 2019-06-18 DIAGNOSIS — Z86718 Personal history of other venous thrombosis and embolism: Secondary | ICD-10-CM

## 2019-06-18 MED ORDER — HYDROMORPHONE HCL 4 MG PO TABS: ORAL_TABLET | ORAL | 0 refills | Status: DC

## 2019-06-18 NOTE — Progress Notes (Signed)
Subjective:    Patient ID: Melinda Moore, female    DOB: 1971/01/14, 49 y.o.   MRN: [id number]  HPI  Patient calls for surgical clearance for partial knee replacement. Patient has no problems or concerns. This patient has longstanding osteoarthritis.  It is now at end-stage and needs a partial knee replacement.  She is tried various measures including anti-inflammatories and pain medicines and can no longer function She also suffers with MS and she is on multiple different medications to help her with chronic fatigue associated with that as well as neuropathy.  She is also on medication to help with chronic constipation.  She states her MS is stable currently no recent flareups. In addition to this she is on diuretic and potassium as needed for swelling in the legs plus also she takes sertraline and Wellbutrin on a regular basis to help her with her moods.  She is not currently depressed.  She does have a history of DVT that was back in 2014.  This was unprovoked in the lower extremity.  She was seen by [facility] hematology for further evaluation because of a positive anti-Cardiolipin IgM.  They did not feel this was significant.  They did not feel she had antiphospholipid syndrome.  They felt that long-term anticoagulation was not indicated for her.  She does in their opinion to have a slightly increased risk of developing blood clots based upon her prior history.  This patient relates overall she has been doing fairly well.  She does have chronic pain for which she does take her pain medicines She does need refills on her pain medicine Pain medication does help keep her pain under good control she denies abuse of it denies feeling drugged with it.  States that without the pain medicine she would have a hard time functioning This patient was seen today for chronic pain  The medication list was reviewed and updated.   -Compliance with medication: Relates good compliance with the medicine  - Number  patient states they take daily: Takes anywhere between 4 or 5/day  -when was the last dose patient took?  Took some earlier today  The patient was advised the importance of maintaining medication and not using illegal substances with these.  Here for refills and follow up  The patient was educated that we can provide 3 monthly scripts for their medication, it is their responsibility to follow the instructions.  Side effects or complications from medications: Denies any side effects with the medicine  Patient is aware that pain medications are meant to minimize the severity of the pain to allow their pain levels to improve to allow for better function. They are aware of that pain medications cannot totally remove their pain.  Due for UDT ( at least once per year) : Will be due next office visit for UDT      Virtual Visit via Video Note  I connected with Melinda Moore on 06/18/19 at  3:00 PM EST by a video enabled telemedicine application and verified that I am speaking with the correct person using two identifiers.  Location: Patient: home Provider: office   I discussed the limitations of evaluation and management by telemedicine and the availability of in person appointments. The patient expressed understanding and agreed to proceed.  History of Present Illness:    Observations/Objective:   Assessment and Plan:   Follow Up Instructions:    I discussed the assessment and treatment plan with the patient. The patient was provided  an opportunity to ask questions and all were answered. The patient agreed with the plan and demonstrated an understanding of the instructions.   The patient was advised to call back or seek an in-person evaluation if the symptoms worsen or if the condition fails to improve as anticipated.  I provided 20 minutes of non-face-to-face time during this encounter.  Patient states she is able to do mild housework walk up a flight of steps and able to walk  outside without shortness of breath chest tightness pressure pain She denies any bleeding issues when she gets cut She did have a DVT back in 2014 and at that time had blood work evaluation which did show an abnormality.    Review of Systems  Constitutional: Negative for activity change, appetite change and fatigue.  HENT: Negative for congestion and rhinorrhea.   Respiratory: Negative for cough and shortness of breath.   Cardiovascular: Negative for chest pain and leg swelling.  Gastrointestinal: Negative for abdominal pain and diarrhea.  Endocrine: Negative for polydipsia and polyphagia.  Skin: Negative for color change.  Neurological: Negative for dizziness and weakness.  Psychiatric/Behavioral: Negative for behavioral problems and confusion.       Objective:   Physical Exam  Patient had virtual visit Appears to be in no distress Atraumatic Neuro able to relate and oriented No apparent resp distress Color normal       Assessment & Plan:  The patient was seen in followup for chronic pain. A review over at their current pain status was discussed. Drug registry was checked. Prescriptions were given. Discussion was held regarding the importance of compliance with medication as well as pain medication contract.  Time for questions regarding pain management plan occurred. Importance of regular followup visits was discussed. Patient was informed that medication may cause drowsiness and should not be combined  with other medications/alcohol or street drugs. Patient was cautioned that medication could cause drowsiness. If the patient feels medication is causing altered alertness then do not drive or operate dangerous equipment.  She is facing a partial knee replacement overall she should do well but she should take precautions against blood clots  I was able to review the 2014 note from hematology.  It is included in the above history.  They did not feel that she has underlying  antiphospholipid syndrome.  They did not recommend lifelong anticoagulation.  But they did state that she is at higher risk of blood clots because of having a previous blood clot.  Because of this I have encouraged patient to talk with her orthopedist to discuss and implement strategies to prevent blood clot in the recovery phase of her knee surgery.  She is medically and from a cardiology standpoint felt to be optimized for surgery.  And the approval note was signed to be sent to orthopedics along with this note.  If any further questions that they have they can connect with [place] directly

## 2019-06-22 ENCOUNTER — Telehealth: Payer: Self-pay | Admitting: Family Medicine

## 2019-06-22 NOTE — Telephone Encounter (Signed)
Please advise. Thank you

## 2019-06-22 NOTE — Telephone Encounter (Signed)
Pt calling to check status of surgical clearance form that we received from [facility] for her right knee surgery  They're waiting on this information before scheduling her surgery  Fax# [phone] Attn:  Alfonso Ramus, surgical coordinator

## 2019-06-23 DIAGNOSIS — Z86718 Personal history of other venous thrombosis and embolism: Secondary | ICD-10-CM | POA: Insufficient documentation

## 2019-06-23 NOTE — Telephone Encounter (Signed)
The surgical approval note as well as copy of office visit with [facility] hematology from 2014--which I printed Need to be faxed to the orthopedist. Also a copy of my most recent note from last week needs to be included. Please send this forward. Please also notify the patient that this is been done thank you

## 2019-06-24 NOTE — Telephone Encounter (Signed)
Faxed over office notes with clearance sheet.

## 2019-06-25 ENCOUNTER — Other Ambulatory Visit: Payer: Self-pay | Admitting: Family Medicine

## 2019-06-25 NOTE — Telephone Encounter (Signed)
Last visit 06/18/19 for surgical clearance

## 2019-07-09 ENCOUNTER — Encounter (HOSPITAL_COMMUNITY): Payer: Self-pay

## 2019-07-09 NOTE — Patient Instructions (Addendum)
DUE TO COVID-19 ONLY ONE VISITOR IS ALLOWED TO COME WITH YOU AND STAY IN THE WAITING ROOM ONLY DURING PRE OP AND PROCEDURE. THE ONE VISITOR MAY VISIT WITH YOU IN YOUR PRIVATE ROOM DURING VISITING HOURS ONLY!!   COVID SWAB TESTING MUST BE COMPLETED ON:  Friday, Jan. 29, 2021 at 11:25 AM   [address] [place] [facility] enter pre surgical testing line (Must self quarantine after testing. Follow instructions on handout.)             Your procedure is scheduled on: Tuesday, Feb. 2, 2021   Report to [facility] Main  Entrance   Report to Short Stay at 5:30 AM   Call this number if you have problems the morning of surgery [phone]   Do not eat food :After Midnight.   May have liquids until 4:30 AM day of surgery   CLEAR LIQUID DIET  Foods Allowed                                                                     Foods Excluded  Water, Black Coffee and tea, regular and decaf                             liquids that you cannot  Plain Jell-O in any flavor  (No red)                                           see through such as: Fruit ices (not with fruit pulp)                                     milk, soups, orange juice  Iced Popsicles (No red)                                    All solid food Carbonated beverages, regular and diet                                    Apple juices Sports drinks like Gatorade (No red) Lightly seasoned clear broth or consume(fat free) Sugar, honey syrup  Sample Menu Breakfast                                Lunch                                     Supper Cranberry juice                    Beef broth                            Chicken broth  Jell-O                                     Grape juice                           Apple juice Coffee or tea                        Jell-O                                      Popsicle                                                Coffee or tea                        Coffee or  tea   Complete one Ensure drink the morning of surgery at 4:30 AM the day of surgery.   Brush your teeth the morning of surgery.   Do NOT smoke after Midnight   Take these medicines the morning of surgery with A SIP OF WATER: Bupropion, Gilenya, Gabapentin, Pantoprazole, Sertraline                               You may not have any metal on your body including hair pins, jewelry, and body piercings             Do not wear make-up, lotions, powders, perfumes/cologne, or deodorant             Do not wear nail polish.  Do not shave  48 hours prior to surgery.               Do not bring valuables to the hospital. [facility] IS NOT             RESPONSIBLE   FOR VALUABLES.   Contacts, dentures or bridgework may not be worn into surgery.   Bring small overnight bag day of surgery.    Special Instructions: Bring a copy of your healthcare power of attorney and living will documents         the day of surgery if you haven't scanned them in before.              Please read over the following fact sheets you were given:  [facility] - Preparing for Surgery Before surgery, you can play an important role.  Because skin is not sterile, your skin needs to be as free of germs as possible.  You can reduce the number of germs on your skin by washing with CHG (chlorahexidine gluconate) soap before surgery.  CHG is an antiseptic cleaner which kills germs and bonds with the skin to continue killing germs even after washing. Please DO NOT use if you have an allergy to CHG or antibacterial soaps.  If your skin becomes reddened/irritated stop using the CHG and inform your nurse when you arrive at Short Stay. Do not shave (including legs and underarms) for at least 48 hours prior to the  first CHG shower.  You may shave your face/neck.  Please follow these instructions carefully:  1.  Shower with CHG Soap the night before surgery and the  morning of surgery.  2.  If you choose to wash your hair, wash your hair  first as usual with your normal  shampoo.  3.  After you shampoo, rinse your hair and body thoroughly to remove the shampoo.                             4.  Use CHG as you would any other liquid soap.  You can apply chg directly to the skin and wash.  Gently with a scrungie or clean washcloth.  5.  Apply the CHG Soap to your body ONLY FROM THE NECK DOWN.   Do   not use on face/ open                           Wound or open sores. Avoid contact with eyes, ears mouth and   genitals (private parts).                       Wash face,  Genitals (private parts) with your normal soap.             6.  Wash thoroughly, paying special attention to the area where your    surgery  will be performed.  7.  Thoroughly rinse your body with warm water from the neck down.  8.  DO NOT shower/wash with your normal soap after using and rinsing off the CHG Soap.                9.  Pat yourself dry with a clean towel.            10.  Wear clean pajamas.            11.  Place clean sheets on your bed the night of your first shower and do not  sleep with pets. Day of Surgery : Do not apply any lotions/deodorants the morning of surgery.  Please wear clean clothes to the hospital/surgery center.  FAILURE TO FOLLOW THESE INSTRUCTIONS MAY RESULT IN THE CANCELLATION OF YOUR SURGERY  PATIENT SIGNATURE_________________________________  NURSE SIGNATURE__________________________________  ________________________________________________________________________   Melinda Moore  An incentive spirometer is a tool that can help keep your lungs clear and active. This tool measures how well you are filling your lungs with each breath. Taking long deep breaths may help reverse or decrease the chance of developing breathing (pulmonary) problems (especially infection) following:  A long period of time when you are unable to move or be active. BEFORE THE PROCEDURE   If the spirometer includes an indicator to show your best  effort, your nurse or respiratory therapist will set it to a desired goal.  If possible, sit up straight or lean slightly forward. Try not to slouch.  Hold the incentive spirometer in an upright position. INSTRUCTIONS FOR USE  1. Sit on the edge of your bed if possible, or sit up as far as you can in bed or on a chair. 2. Hold the incentive spirometer in an upright position. 3. Breathe out normally. 4. Place the mouthpiece in your mouth and seal your lips tightly around it. 5. Breathe in slowly and as deeply as possible, raising the piston or the ball toward the top of  the column. 6. Hold your breath for 3-5 seconds or for as long as possible. Allow the piston or ball to fall to the bottom of the column. 7. Remove the mouthpiece from your mouth and breathe out normally. 8. Rest for a few seconds and repeat Steps 1 through 7 at least 10 times every 1-2 hours when you are awake. Take your time and take a few normal breaths between deep breaths. 9. The spirometer may include an indicator to show your best effort. Use the indicator as a goal to work toward during each repetition. 10. After each set of 10 deep breaths, practice coughing to be sure your lungs are clear. If you have an incision (the cut made at the time of surgery), support your incision when coughing by placing a pillow or rolled up towels firmly against it. Once you are able to get out of bed, walk around indoors and cough well. You may stop using the incentive spirometer when instructed by your caregiver.  RISKS AND COMPLICATIONS  Take your time so you do not get dizzy or light-headed.  If you are in pain, you may need to take or ask for pain medication before doing incentive spirometry. It is harder to take a deep breath if you are having pain. AFTER USE  Rest and breathe slowly and easily.  It can be helpful to keep track of a log of your progress. Your caregiver can provide you with a simple table to help with this. If you  are using the spirometer at home, follow these instructions: SEEK MEDICAL CARE IF:   You are having difficultly using the spirometer.  You have trouble using the spirometer as often as instructed.  Your pain medication is not giving enough relief while using the spirometer.  You develop fever of 100.5 F (38.1 C) or higher. SEEK IMMEDIATE MEDICAL CARE IF:   You cough up bloody sputum that had not been present before.  You develop fever of 102 F (38.9 C) or greater.  You develop worsening pain at or near the incision site. MAKE SURE YOU:   Understand these instructions.  Will watch your condition.  Will get help right away if you are not doing well or get worse. Document Released: 10/08/2006 Document Revised: 08/20/2011 Document Reviewed: 12/09/2006 [facility] Patient Information 2014 [place], [place].   ________________________________________________________________________

## 2019-07-10 ENCOUNTER — Encounter (HOSPITAL_COMMUNITY): Payer: Self-pay

## 2019-07-10 ENCOUNTER — Other Ambulatory Visit: Payer: Self-pay

## 2019-07-10 ENCOUNTER — Encounter (HOSPITAL_COMMUNITY)
Admission: RE | Admit: 2019-07-10 | Discharge: 2019-07-10 | Disposition: A | Discharge status: Home / Self Care | Payer: 59 | Source: Ambulatory Visit | Attending: Orthopedic Surgery | Admitting: Orthopedic Surgery

## 2019-07-10 ENCOUNTER — Other Ambulatory Visit (HOSPITAL_COMMUNITY)
Admission: RE | Admit: 2019-07-10 | Discharge: 2019-07-10 | Disposition: A | Discharge status: Home / Self Care | Payer: 59 | Source: Ambulatory Visit | Attending: Orthopedic Surgery | Admitting: Orthopedic Surgery

## 2019-07-10 DIAGNOSIS — Z20822 Contact with and (suspected) exposure to covid-19: Secondary | ICD-10-CM | POA: Insufficient documentation

## 2019-07-10 DIAGNOSIS — Z01812 Encounter for preprocedural laboratory examination: Secondary | ICD-10-CM | POA: Insufficient documentation

## 2019-07-10 HISTORY — DX: Unspecified right bundle-branch block: I45.10

## 2019-07-10 HISTORY — DX: Personal history of other diseases of the nervous system and sense organs: Z86.69

## 2019-07-10 LAB — BASIC METABOLIC PANEL
Anion gap: 6 (ref 5–15)
BUN: 6 mg/dL (ref 6–20)
CO2: 28 mmol/L (ref 22–32)
Calcium: 8.7 mg/dL — ABNORMAL LOW (ref 8.9–10.3)
Chloride: 105 mmol/L (ref 98–111)
Creatinine, Ser: 0.85 mg/dL (ref 0.44–1.00)
GFR calc Af Amer: >60 mL/min (ref >60)
GFR calc non Af Amer: >60 mL/min (ref >60)
Glucose, Bld: 98 mg/dL (ref 70–99)
Potassium: 3.8 mmol/L (ref 3.5–5.1)
Sodium: 139 mmol/L (ref 135–145)

## 2019-07-10 LAB — CBC
HCT: 38.1 % (ref 36.0–46.0)
Hemoglobin: 11.8 g/dL — ABNORMAL LOW (ref 12.0–15.0)
MCH: 27.5 pg (ref 26.0–34.0)
MCHC: 31 g/dL (ref 30.0–36.0)
MCV: 88.8 fL (ref 80.0–100.0)
Platelets: 280 10*3/uL (ref 150–400)
RBC: 4.29 MIL/uL (ref 3.87–5.11)
RDW: 15 % (ref 11.5–15.5)
WBC: 3.5 10*3/uL — ABNORMAL LOW (ref 4.0–10.5)
nRBC: 0 % (ref 0.0–0.2)

## 2019-07-10 LAB — SARS CORONAVIRUS 2 (TAT 6-24 HRS): SARS Coronavirus 2: NEGATIVE

## 2019-07-10 LAB — SURGICAL PCR SCREEN
MRSA, PCR: NEGATIVE
Staphylococcus aureus: NEGATIVE

## 2019-07-10 NOTE — Progress Notes (Signed)
PCP - Dr. Fletcher Anon Last office visit 06/18/19 clearance in note in epic Neurologist: Dr. Oren Section clearance on chart Cardiologist - N/A  Chest x-ray - greater than 2 years EKG - greater than 2 years Stress Test - N/A ECHO - N/A Cardiac Cath - N/A  Sleep Study - N/A CPAP - N/A  Fasting Blood Sugar - N/A Checks Blood Sugar _N/A____ times a day  Blood Thinner Instructions:  N/A Aspirin Instructions: N/A Last Dose: N/A  Anesthesia review: MS, HTN  Patient denies shortness of breath, fever, cough and chest pain at PAT appointment   Patient verbalized understanding of instructions that were given to them at the PAT appointment. Patient was also instructed that they will need to review over the PAT instructions again at home before surgery.

## 2019-07-13 NOTE — Anesthesia Preprocedure Evaluation (Addendum)
Anesthesia Evaluation  Patient identified by MRN, date of birth, ID band Patient awake    Reviewed: Allergy & Precautions, NPO status , Patient's Chart, lab work & pertinent test results  History of Anesthesia Complications Negative for: history of anesthetic complications  Airway Mallampati: II  TM Distance: >3 FB Neck ROM: Full    Dental   Pulmonary neg pulmonary ROS, former smoker,    Pulmonary exam normal        Cardiovascular hypertension, + DVT  Normal cardiovascular exam     Neuro/Psych PSYCHIATRIC DISORDERS Anxiety Depression Multiple Sclerosis    GI/Hepatic Neg liver ROS, GERD  ,  Endo/Other  negative endocrine ROS  Renal/GU negative Renal ROS  negative genitourinary   Musculoskeletal  (+) Arthritis , Fibromyalgia -  Abdominal   Peds  Hematology  (+) anemia ,   Anesthesia Other Findings   Reproductive/Obstetrics                            Anesthesia Physical Anesthesia Plan  ASA: II  Anesthesia Plan: Spinal   Post-op Pain Management:  Regional for Post-op pain   Induction:   PONV Risk Score and Plan: 2 and Propofol infusion, TIVA and Treatment may vary due to age or medical condition  Airway Management Planned: Nasal Cannula and Simple Face Mask  Additional Equipment: None  Intra-op Plan:   Post-operative Plan:   Informed Consent: I have reviewed the patients History and Physical, chart, labs and discussed the procedure including the risks, benefits and alternatives for the proposed anesthesia with the patient or authorized representative who has indicated his/her understanding and acceptance.       Plan Discussed with:   Anesthesia Plan Comments:        Anesthesia Quick Evaluation

## 2019-07-14 ENCOUNTER — Inpatient Hospital Stay (HOSPITAL_COMMUNITY): Payer: 59

## 2019-07-14 ENCOUNTER — Encounter (HOSPITAL_COMMUNITY): Payer: Self-pay | Admitting: Orthopedic Surgery

## 2019-07-14 ENCOUNTER — Inpatient Hospital Stay (HOSPITAL_COMMUNITY): Payer: 59 | Admitting: Anesthesiology

## 2019-07-14 ENCOUNTER — Inpatient Hospital Stay (HOSPITAL_COMMUNITY)
Admission: RE | Admit: 2019-07-14 | Discharge: 2019-07-15 | DRG: 470 | Disposition: A | Discharge status: Home / Self Care | Payer: 59 | Attending: Orthopedic Surgery | Admitting: Orthopedic Surgery

## 2019-07-14 ENCOUNTER — Inpatient Hospital Stay (HOSPITAL_COMMUNITY): Payer: 59 | Admitting: Physician Assistant

## 2019-07-14 ENCOUNTER — Other Ambulatory Visit: Payer: Self-pay

## 2019-07-14 ENCOUNTER — Encounter (HOSPITAL_COMMUNITY)
Admission: RE | Disposition: A | Discharge status: Home / Self Care | Payer: Self-pay | Source: Home / Self Care | Attending: Orthopedic Surgery

## 2019-07-14 DIAGNOSIS — Z79891 Long term (current) use of opiate analgesic: Secondary | ICD-10-CM | POA: Diagnosis not present

## 2019-07-14 DIAGNOSIS — Z888 Allergy status to other drugs, medicaments and biological substances status: Secondary | ICD-10-CM | POA: Diagnosis not present

## 2019-07-14 DIAGNOSIS — Z86718 Personal history of other venous thrombosis and embolism: Secondary | ICD-10-CM

## 2019-07-14 DIAGNOSIS — I10 Essential (primary) hypertension: Secondary | ICD-10-CM | POA: Diagnosis not present

## 2019-07-14 DIAGNOSIS — F419 Anxiety disorder, unspecified: Secondary | ICD-10-CM | POA: Diagnosis present

## 2019-07-14 DIAGNOSIS — Z87891 Personal history of nicotine dependence: Secondary | ICD-10-CM

## 2019-07-14 DIAGNOSIS — Z79899 Other long term (current) drug therapy: Secondary | ICD-10-CM

## 2019-07-14 DIAGNOSIS — M797 Fibromyalgia: Secondary | ICD-10-CM | POA: Diagnosis not present

## 2019-07-14 DIAGNOSIS — K219 Gastro-esophageal reflux disease without esophagitis: Secondary | ICD-10-CM | POA: Diagnosis not present

## 2019-07-14 DIAGNOSIS — F329 Major depressive disorder, single episode, unspecified: Secondary | ICD-10-CM | POA: Diagnosis present

## 2019-07-14 DIAGNOSIS — M1711 Unilateral primary osteoarthritis, right knee: Principal | ICD-10-CM | POA: Diagnosis present

## 2019-07-14 DIAGNOSIS — Z91048 Other nonmedicinal substance allergy status: Secondary | ICD-10-CM

## 2019-07-14 DIAGNOSIS — G35 Multiple sclerosis: Secondary | ICD-10-CM | POA: Diagnosis present

## 2019-07-14 DIAGNOSIS — Z8249 Family history of ischemic heart disease and other diseases of the circulatory system: Secondary | ICD-10-CM

## 2019-07-14 DIAGNOSIS — M25561 Pain in right knee: Secondary | ICD-10-CM | POA: Diagnosis present

## 2019-07-14 HISTORY — PX: PATELLA-FEMORAL ARTHROPLASTY: SHX5037

## 2019-07-14 LAB — CBC
HCT: 39.5 % (ref 36.0–46.0)
Hemoglobin: 12.1 g/dL (ref 12.0–15.0)
MCH: 27.3 pg (ref 26.0–34.0)
MCHC: 30.6 g/dL (ref 30.0–36.0)
MCV: 89.2 fL (ref 80.0–100.0)
Platelets: 305 10*3/uL (ref 150–400)
RBC: 4.43 MIL/uL (ref 3.87–5.11)
RDW: 14.7 % (ref 11.5–15.5)
WBC: 5.7 10*3/uL (ref 4.0–10.5)
nRBC: 0 % (ref 0.0–0.2)

## 2019-07-14 LAB — CREATININE, SERUM
Creatinine, Ser: 1.07 mg/dL — ABNORMAL HIGH (ref 0.44–1.00)
GFR calc Af Amer: >60 mL/min (ref >60)
GFR calc non Af Amer: >60 mL/min (ref >60)

## 2019-07-14 SURGERY — ARTHROPLASTY, PATELLOFEMORAL
Anesthesia: Spinal | Site: Knee | Laterality: Right

## 2019-07-14 MED ORDER — BUPROPION HCL ER (SR) 150 MG PO TB12
150.0000 mg | ORAL_TABLET | Freq: Three times a day (TID) | ORAL | Status: DC
  Administered 2019-07-14 – 2019-07-15 (×3): 150 mg via ORAL
  Filled 2019-07-14 (×3): qty 1

## 2019-07-14 MED ORDER — DEXAMETHASONE SODIUM PHOSPHATE 10 MG/ML IJ SOLN
INTRAMUSCULAR | Status: AC
  Filled 2019-07-14: qty 1

## 2019-07-14 MED ORDER — MEPIVACAINE HCL (PF) 2 % IJ SOLN
INTRAMUSCULAR | Status: DC | PRN
  Administered 2019-07-14: 3 mL via EPIDURAL

## 2019-07-14 MED ORDER — DEXAMETHASONE SODIUM PHOSPHATE 10 MG/ML IJ SOLN
10.0000 mg | Freq: Once | INTRAMUSCULAR | Status: AC
  Administered 2019-07-15: 10 mg via INTRAVENOUS
  Filled 2019-07-14: qty 1

## 2019-07-14 MED ORDER — POTASSIUM CHLORIDE IN NACL 20-0.45 MEQ/L-% IV SOLN
INTRAVENOUS | Status: DC
  Filled 2019-07-14 (×2): qty 1000

## 2019-07-14 MED ORDER — POLYETHYLENE GLYCOL 400 0.25 % OP SOLN: 1.0000 [drp] | Freq: Two times a day (BID) | OPHTHALMIC | Status: DC | PRN

## 2019-07-14 MED ORDER — PHENOL 1.4 % MT LIQD
1.0000 | OROMUCOSAL | Status: DC | PRN
  Filled 2019-07-14: qty 177

## 2019-07-14 MED ORDER — HYDROMORPHONE HCL 1 MG/ML IJ SOLN
0.5000 mg | INTRAMUSCULAR | Status: DC | PRN
  Administered 2019-07-14: 0.5 mg via INTRAVENOUS
  Filled 2019-07-14: qty 1

## 2019-07-14 MED ORDER — DEXAMETHASONE SODIUM PHOSPHATE 10 MG/ML IJ SOLN
INTRAMUSCULAR | Status: DC | PRN
  Administered 2019-07-14: 10 mg via INTRAVENOUS

## 2019-07-14 MED ORDER — ENOXAPARIN SODIUM 30 MG/0.3ML ~~LOC~~ SOLN
30.0000 mg | Freq: Two times a day (BID) | SUBCUTANEOUS | Status: DC
  Administered 2019-07-15: 30 mg via SUBCUTANEOUS
  Filled 2019-07-14: qty 0.3

## 2019-07-14 MED ORDER — ONDANSETRON HCL 4 MG PO TABS
4.0000 mg | ORAL_TABLET | Freq: Four times a day (QID) | ORAL | Status: DC | PRN
  Administered 2019-07-14: 4 mg via ORAL
  Filled 2019-07-14: qty 1

## 2019-07-14 MED ORDER — POTASSIUM CHLORIDE CRYS ER 20 MEQ PO TBCR
20.0000 meq | EXTENDED_RELEASE_TABLET | Freq: Every day | ORAL | Status: DC
  Administered 2019-07-15: 20 meq via ORAL
  Filled 2019-07-14: qty 1

## 2019-07-14 MED ORDER — FENTANYL CITRATE (PF) 100 MCG/2ML IJ SOLN
INTRAMUSCULAR | Status: DC | PRN
  Administered 2019-07-14: 100 ug via INTRAVENOUS

## 2019-07-14 MED ORDER — ONDANSETRON HCL 4 MG/2ML IJ SOLN: 4.0000 mg | Freq: Once | INTRAMUSCULAR | Status: DC | PRN

## 2019-07-14 MED ORDER — METHOCARBAMOL 500 MG IVPB - SIMPLE MED
500.0000 mg | Freq: Four times a day (QID) | INTRAVENOUS | Status: DC | PRN
  Administered 2019-07-14: 500 mg via INTRAVENOUS
  Filled 2019-07-14: qty 50

## 2019-07-14 MED ORDER — POLYVINYL ALCOHOL 1.4 % OP SOLN
1.0000 [drp] | Freq: Two times a day (BID) | OPHTHALMIC | Status: DC | PRN
  Filled 2019-07-14: qty 15

## 2019-07-14 MED ORDER — OXYCODONE HCL 5 MG/5ML PO SOLN: 5.0000 mg | Freq: Once | ORAL | Status: DC | PRN

## 2019-07-14 MED ORDER — PANTOPRAZOLE SODIUM 40 MG PO TBEC
40.0000 mg | DELAYED_RELEASE_TABLET | Freq: Every day | ORAL | Status: DC
  Administered 2019-07-15: 40 mg via ORAL
  Filled 2019-07-14 (×2): qty 1

## 2019-07-14 MED ORDER — KETOROLAC TROMETHAMINE 30 MG/ML IJ SOLN
INTRAMUSCULAR | Status: DC | PRN
  Administered 2019-07-14: 30 mg via INTRAVENOUS

## 2019-07-14 MED ORDER — TRANEXAMIC ACID-NACL 1000-0.7 MG/100ML-% IV SOLN
1000.0000 mg | Freq: Once | INTRAVENOUS | Status: AC
  Administered 2019-07-14: 1000 mg via INTRAVENOUS
  Filled 2019-07-14: qty 100

## 2019-07-14 MED ORDER — CHLORHEXIDINE GLUCONATE 4 % EX LIQD: 60.0000 mL | Freq: Once | CUTANEOUS | Status: DC

## 2019-07-14 MED ORDER — ACETAMINOPHEN 500 MG PO TABS
1000.0000 mg | ORAL_TABLET | Freq: Four times a day (QID) | ORAL | Status: AC
  Administered 2019-07-14 – 2019-07-15 (×3): 1000 mg via ORAL
  Filled 2019-07-14 (×4): qty 2

## 2019-07-14 MED ORDER — BACLOFEN 10 MG PO TABS: 10.0000 mg | ORAL_TABLET | Freq: Three times a day (TID) | ORAL | 0 refills | Status: DC

## 2019-07-14 MED ORDER — HYDROMORPHONE HCL 4 MG PO TABS: 4.0000 mg | ORAL_TABLET | ORAL | 0 refills | Status: DC | PRN

## 2019-07-14 MED ORDER — TORSEMIDE 20 MG PO TABS
20.0000 mg | ORAL_TABLET | Freq: Every day | ORAL | Status: DC
  Administered 2019-07-14: 20 mg via ORAL
  Filled 2019-07-14 (×2): qty 1

## 2019-07-14 MED ORDER — SERTRALINE HCL 25 MG PO TABS
25.0000 mg | ORAL_TABLET | Freq: Two times a day (BID) | ORAL | Status: DC
  Administered 2019-07-14 – 2019-07-15 (×2): 25 mg via ORAL
  Filled 2019-07-14 (×3): qty 1

## 2019-07-14 MED ORDER — OXYCODONE HCL 5 MG PO TABS: 5.0000 mg | ORAL_TABLET | Freq: Once | ORAL | Status: DC | PRN

## 2019-07-14 MED ORDER — ONDANSETRON HCL 4 MG/2ML IJ SOLN
INTRAMUSCULAR | Status: AC
  Filled 2019-07-14: qty 2

## 2019-07-14 MED ORDER — FENTANYL CITRATE (PF) 100 MCG/2ML IJ SOLN
INTRAMUSCULAR | Status: AC
  Filled 2019-07-14: qty 2

## 2019-07-14 MED ORDER — PROPOFOL 500 MG/50ML IV EMUL
INTRAVENOUS | Status: DC | PRN
  Administered 2019-07-14: 75 ug/kg/min via INTRAVENOUS

## 2019-07-14 MED ORDER — STERILE WATER FOR IRRIGATION IR SOLN
Status: DC | PRN
  Administered 2019-07-14: 1000 mL

## 2019-07-14 MED ORDER — PROPOFOL 10 MG/ML IV BOLUS
INTRAVENOUS | Status: AC
  Filled 2019-07-14: qty 20

## 2019-07-14 MED ORDER — TRANEXAMIC ACID-NACL 1000-0.7 MG/100ML-% IV SOLN
1000.0000 mg | INTRAVENOUS | Status: AC
  Administered 2019-07-14: 1000 mg via INTRAVENOUS
  Filled 2019-07-14: qty 100

## 2019-07-14 MED ORDER — METOCLOPRAMIDE HCL 5 MG/ML IJ SOLN: 5.0000 mg | Freq: Three times a day (TID) | INTRAMUSCULAR | Status: DC | PRN

## 2019-07-14 MED ORDER — OXYCODONE HCL 5 MG PO TABS
5.0000 mg | ORAL_TABLET | ORAL | Status: DC | PRN
  Administered 2019-07-14: 5 mg via ORAL
  Administered 2019-07-15 (×2): 10 mg via ORAL
  Filled 2019-07-14: qty 2
  Filled 2019-07-14: qty 1
  Filled 2019-07-14: qty 2

## 2019-07-14 MED ORDER — VITAMIN D3 125 MCG (5000 UT) PO TABS: 20000.0000 [IU] | ORAL_TABLET | Freq: Two times a day (BID) | ORAL | Status: DC

## 2019-07-14 MED ORDER — ACETAMINOPHEN 500 MG PO TABS
1000.0000 mg | ORAL_TABLET | Freq: Once | ORAL | Status: AC
  Administered 2019-07-14: 1000 mg via ORAL
  Filled 2019-07-14: qty 2

## 2019-07-14 MED ORDER — PROPOFOL 10 MG/ML IV BOLUS
INTRAVENOUS | Status: AC
  Filled 2019-07-14: qty 120

## 2019-07-14 MED ORDER — METHOCARBAMOL 500 MG IVPB - SIMPLE MED
INTRAVENOUS | Status: AC
  Filled 2019-07-14: qty 50

## 2019-07-14 MED ORDER — METHYLPHENIDATE HCL 10 MG PO TABS: 10.0000 mg | ORAL_TABLET | Freq: Two times a day (BID) | ORAL | Status: DC | PRN

## 2019-07-14 MED ORDER — BUPIVACAINE HCL (PF) 0.25 % IJ SOLN
INTRAMUSCULAR | Status: AC
  Filled 2019-07-14: qty 30

## 2019-07-14 MED ORDER — POVIDONE-IODINE 10 % EX SWAB
2.0000 "application " | Freq: Once | CUTANEOUS | Status: AC
  Administered 2019-07-14: 2 via TOPICAL

## 2019-07-14 MED ORDER — OXYCODONE HCL 5 MG PO TABS
10.0000 mg | ORAL_TABLET | ORAL | Status: DC | PRN
  Filled 2019-07-14 (×2): qty 2

## 2019-07-14 MED ORDER — ROPIVACAINE HCL 5 MG/ML IJ SOLN
INTRAMUSCULAR | Status: DC | PRN
  Administered 2019-07-14: 30 mL via PERINEURAL

## 2019-07-14 MED ORDER — MENTHOL 3 MG MT LOZG: 1.0000 | LOZENGE | OROMUCOSAL | Status: DC | PRN

## 2019-07-14 MED ORDER — METHOCARBAMOL 500 MG PO TABS
500.0000 mg | ORAL_TABLET | Freq: Four times a day (QID) | ORAL | Status: DC | PRN
  Administered 2019-07-14 – 2019-07-15 (×2): 500 mg via ORAL
  Filled 2019-07-14 (×2): qty 1

## 2019-07-14 MED ORDER — ALUM & MAG HYDROXIDE-SIMETH 200-200-20 MG/5ML PO SUSP: 30.0000 mL | ORAL | Status: DC | PRN

## 2019-07-14 MED ORDER — MAGNESIUM CITRATE PO SOLN: 1.0000 | Freq: Once | ORAL | Status: DC | PRN

## 2019-07-14 MED ORDER — FINGOLIMOD HCL 0.5 MG PO CAPS: 0.5000 mg | ORAL_CAPSULE | Freq: Every day | ORAL | Status: DC

## 2019-07-14 MED ORDER — CARBAMAZEPINE ER 200 MG PO TB12
200.0000 mg | ORAL_TABLET | Freq: Three times a day (TID) | ORAL | Status: DC | PRN
  Filled 2019-07-14: qty 1

## 2019-07-14 MED ORDER — LUTEIN 6 MG PO CAPS: 1.0000 | ORAL_CAPSULE | Freq: Every day | ORAL | Status: DC

## 2019-07-14 MED ORDER — DOCUSATE SODIUM 100 MG PO CAPS
100.0000 mg | ORAL_CAPSULE | Freq: Two times a day (BID) | ORAL | Status: DC
  Administered 2019-07-14 – 2019-07-15 (×2): 100 mg via ORAL
  Filled 2019-07-14 (×3): qty 1

## 2019-07-14 MED ORDER — 0.9 % SODIUM CHLORIDE (POUR BTL) OPTIME
TOPICAL | Status: DC | PRN
  Administered 2019-07-14: 08:00:00 1000 mL

## 2019-07-14 MED ORDER — GABAPENTIN 400 MG PO CAPS
1200.0000 mg | ORAL_CAPSULE | Freq: Three times a day (TID) | ORAL | Status: DC
  Administered 2019-07-14 – 2019-07-15 (×3): 1200 mg via ORAL
  Filled 2019-07-14 (×3): qty 3

## 2019-07-14 MED ORDER — MIDAZOLAM HCL 5 MG/5ML IJ SOLN
INTRAMUSCULAR | Status: DC | PRN
  Administered 2019-07-14: 2 mg via INTRAVENOUS

## 2019-07-14 MED ORDER — VITAMIN D 25 MCG (1000 UNIT) PO TABS
2500.0000 [IU] | ORAL_TABLET | Freq: Two times a day (BID) | ORAL | Status: DC
  Administered 2019-07-14 – 2019-07-15 (×2): 2500 [IU] via ORAL
  Filled 2019-07-14 (×2): qty 3

## 2019-07-14 MED ORDER — LACTATED RINGERS IV SOLN: INTRAVENOUS | Status: DC

## 2019-07-14 MED ORDER — SODIUM CHLORIDE 0.9 % IV SOLN
INTRAVENOUS | Status: AC | PRN
  Administered 2019-07-14: 1000 mL

## 2019-07-14 MED ORDER — ADULT MULTIVITAMIN W/MINERALS CH
1.0000 | ORAL_TABLET | Freq: Every day | ORAL | Status: DC
  Administered 2019-07-14 – 2019-07-15 (×2): 1 via ORAL
  Filled 2019-07-14 (×2): qty 1

## 2019-07-14 MED ORDER — CEFAZOLIN SODIUM-DEXTROSE 2-4 GM/100ML-% IV SOLN
2.0000 g | INTRAVENOUS | Status: AC
  Administered 2019-07-14: 08:00:00 2 g via INTRAVENOUS
  Filled 2019-07-14: qty 100

## 2019-07-14 MED ORDER — BUPIVACAINE HCL (PF) 0.25 % IJ SOLN
INTRAMUSCULAR | Status: DC | PRN
  Administered 2019-07-14: 30 mL

## 2019-07-14 MED ORDER — DIPHENHYDRAMINE HCL 12.5 MG/5ML PO ELIX: 12.5000 mg | ORAL_SOLUTION | ORAL | Status: DC | PRN

## 2019-07-14 MED ORDER — METOCLOPRAMIDE HCL 5 MG PO TABS: 5.0000 mg | ORAL_TABLET | Freq: Three times a day (TID) | ORAL | Status: DC | PRN

## 2019-07-14 MED ORDER — MIDAZOLAM HCL 2 MG/2ML IJ SOLN
INTRAMUSCULAR | Status: AC
  Filled 2019-07-14: qty 2

## 2019-07-14 MED ORDER — ONDANSETRON HCL 4 MG/2ML IJ SOLN: 4.0000 mg | Freq: Four times a day (QID) | INTRAMUSCULAR | Status: DC | PRN

## 2019-07-14 MED ORDER — FENTANYL CITRATE (PF) 100 MCG/2ML IJ SOLN
25.0000 ug | INTRAMUSCULAR | Status: DC | PRN
  Administered 2019-07-14 (×2): 50 ug via INTRAVENOUS

## 2019-07-14 MED ORDER — ONDANSETRON HCL 4 MG/2ML IJ SOLN
INTRAMUSCULAR | Status: DC | PRN
  Administered 2019-07-14: 4 mg via INTRAVENOUS

## 2019-07-14 MED ORDER — KETOROLAC TROMETHAMINE 30 MG/ML IJ SOLN
INTRAMUSCULAR | Status: AC
  Filled 2019-07-14: qty 1

## 2019-07-14 MED ORDER — POLYETHYLENE GLYCOL 3350 17 G PO PACK: 17.0000 g | PACK | Freq: Every day | ORAL | Status: DC | PRN

## 2019-07-14 MED ORDER — ONDANSETRON HCL 4 MG PO TABS: 4.0000 mg | ORAL_TABLET | Freq: Three times a day (TID) | ORAL | 0 refills | Status: DC | PRN

## 2019-07-14 MED ORDER — KETOROLAC TROMETHAMINE 15 MG/ML IJ SOLN
7.5000 mg | Freq: Four times a day (QID) | INTRAMUSCULAR | Status: AC
  Administered 2019-07-14 – 2019-07-15 (×3): 7.5 mg via INTRAVENOUS
  Filled 2019-07-14 (×3): qty 1

## 2019-07-14 MED ORDER — ACETAMINOPHEN 325 MG PO TABS: 325.0000 mg | ORAL_TABLET | Freq: Four times a day (QID) | ORAL | Status: DC | PRN

## 2019-07-14 MED ORDER — CEFAZOLIN SODIUM-DEXTROSE 2-4 GM/100ML-% IV SOLN
2.0000 g | Freq: Four times a day (QID) | INTRAVENOUS | Status: AC
  Administered 2019-07-14 (×2): 2 g via INTRAVENOUS
  Filled 2019-07-14 (×2): qty 100

## 2019-07-14 MED ORDER — BISACODYL 10 MG RE SUPP: 10.0000 mg | Freq: Every day | RECTAL | Status: DC | PRN

## 2019-07-14 SURGICAL SUPPLY — 85 items
BAG DECANTER FOR FLEXI CONT (MISCELLANEOUS) ×3 IMPLANT
BANDAGE ESMARK 6X9 LF (GAUZE/BANDAGES/DRESSINGS) ×1 IMPLANT
BLADE SAGITTAL 25.0X1.27X90 (BLADE) ×3 IMPLANT
BLADE SAGITTAL 25.0X1.27X90MM (BLADE) ×2
BLADE SURG 15 STRL LF DISP TIS (BLADE) ×2 IMPLANT
BLADE SURG 15 STRL SS (BLADE) ×6
BNDG CMPR 9X6 STRL LF SNTH (GAUZE/BANDAGES/DRESSINGS) ×1
BNDG CMPR MED 15X6 ELC VLCR LF (GAUZE/BANDAGES/DRESSINGS) ×1
BNDG ELASTIC 6X15 VLCR STRL LF (GAUZE/BANDAGES/DRESSINGS) ×2 IMPLANT
BNDG ELASTIC 6X5.8 VLCR STR LF (GAUZE/BANDAGES/DRESSINGS) ×3 IMPLANT
BNDG ESMARK 6X9 LF (GAUZE/BANDAGES/DRESSINGS) ×3
BOWL SMART MIX CTS (DISPOSABLE) ×3 IMPLANT
BUR SURG PFJ MILL NEXGEN (Knees) IMPLANT
BURR SURG PFJ MILL NEXGEN (Knees) ×3 IMPLANT
CEMENT BONE R 1X40 (Cement) ×2 IMPLANT
CLOSURE STERI-STRIP 1/2X4 (GAUZE/BANDAGES/DRESSINGS) ×1
CLOSURE WOUND 1/4X4 (GAUZE/BANDAGES/DRESSINGS) ×2
CLSR STERI-STRIP ANTIMIC 1/2X4 (GAUZE/BANDAGES/DRESSINGS) ×2 IMPLANT
COMP FEM NEXGEN SZ3 +3.5 RT (Knees) ×3 IMPLANT
COMP PATELLAR 35 STD 9 THK (Orthopedic Implant) IMPLANT
COMPONENT FEM NEXGN SZ3 +3.5RT (Knees) IMPLANT
COVER BACK TABLE 60X90IN (DRAPES) ×3 IMPLANT
COVER MAYO STAND STRL (DRAPES) ×2 IMPLANT
COVER WAND RF STERILE (DRAPES) IMPLANT
CUFF TOURN SGL QUICK 34 (TOURNIQUET CUFF)
CUFF TRNQT CYL 34X4.125X (TOURNIQUET CUFF) IMPLANT
DECANTER SPIKE VIAL GLASS SM (MISCELLANEOUS) ×1 IMPLANT
DRAPE EXTREMITY T 121X128X90 (DISPOSABLE) ×3 IMPLANT
DRAPE INCISE IOBAN 66X45 STRL (DRAPES) ×3 IMPLANT
DRAPE SHEET LG 3/4 BI-LAMINATE (DRAPES) ×3 IMPLANT
DRAPE U-SHAPE 47X51 STRL (DRAPES) ×3 IMPLANT
DRSG MEPILEX BORDER 4X12 (GAUZE/BANDAGES/DRESSINGS) ×2 IMPLANT
DURAPREP 26ML APPLICATOR (WOUND CARE) ×3 IMPLANT
ELECT REM PT RETURN 15FT ADLT (MISCELLANEOUS) ×3 IMPLANT
FACESHIELD WRAPAROUND (MASK) ×6 IMPLANT
FACESHIELD WRAPAROUND OR TEAM (MASK) ×2 IMPLANT
GAUZE SPONGE 4X4 12PLY STRL (GAUZE/BANDAGES/DRESSINGS) ×3 IMPLANT
GAUZE XEROFORM 5X9 LF (GAUZE/BANDAGES/DRESSINGS) ×3 IMPLANT
GLOVE BIO SURGEON STRL SZ8 (GLOVE) ×3 IMPLANT
GLOVE BIOGEL PI IND STRL 8 (GLOVE) ×2 IMPLANT
GLOVE BIOGEL PI INDICATOR 8 (GLOVE) ×4
GLOVE ORTHO TXT STRL SZ7.5 (GLOVE) ×3 IMPLANT
GOWN STRL REUS W/ TWL LRG LVL3 (GOWN DISPOSABLE) ×1 IMPLANT
GOWN STRL REUS W/ TWL XL LVL3 (GOWN DISPOSABLE) ×2 IMPLANT
GOWN STRL REUS W/TWL LRG LVL3 (GOWN DISPOSABLE) ×3
GOWN STRL REUS W/TWL XL LVL3 (GOWN DISPOSABLE) ×6
HANDPIECE INTERPULSE COAX TIP (DISPOSABLE) ×3
IMMOBILIZER KNEE 20 (SOFTGOODS) ×3
IMMOBILIZER KNEE 20 THIGH 36 (SOFTGOODS) ×1 IMPLANT
IMMOBILIZER KNEE 22 UNIV (SOFTGOODS) ×3 IMPLANT
KIT BASIN OR (CUSTOM PROCEDURE TRAY) ×3 IMPLANT
NDL SAFETY ECLIPSE 18X1.5 (NEEDLE) IMPLANT
NEEDLE HYPO 18GX1.5 SHARP (NEEDLE) ×3
NEEDLE HYPO 22GX1.5 SAFETY (NEEDLE) ×2 IMPLANT
NS IRRIG 1000ML POUR BTL (IV SOLUTION) ×3 IMPLANT
PACK ORTHO EXTREMITY (CUSTOM PROCEDURE TRAY) ×3 IMPLANT
PAD CAST 4YDX4 CTTN HI CHSV (CAST SUPPLIES) IMPLANT
PADDING CAST COTTON 4X4 STRL (CAST SUPPLIES)
PADDING CAST COTTON 6X4 STRL (CAST SUPPLIES) IMPLANT
PATELLA ZIMMER 35MM (Orthopedic Implant) ×3 IMPLANT
PENCIL SMOKE EVACUATOR (MISCELLANEOUS) ×3 IMPLANT
SCREW HEADED 33MM KNEE (MISCELLANEOUS) ×6 IMPLANT
SET HNDPC FAN SPRY TIP SCT (DISPOSABLE) ×1 IMPLANT
SLEEVE SCD COMPRESS KNEE MED (MISCELLANEOUS) IMPLANT
SPONGE LAP 18X18 RF (DISPOSABLE) ×3 IMPLANT
STRIP CLOSURE SKIN 1/4X4 (GAUZE/BANDAGES/DRESSINGS) ×2 IMPLANT
SUCTION FRAZIER HANDLE 10FR (MISCELLANEOUS) ×2
SUCTION TUBE FRAZIER 10FR DISP (MISCELLANEOUS) ×1 IMPLANT
SUT FIBERWIRE #2 38 T-5 BLUE (SUTURE)
SUT MNCRL AB 4-0 PS2 18 (SUTURE) ×5 IMPLANT
SUT VIC AB 0 CT1 27 (SUTURE)
SUT VIC AB 0 CT1 27XBRD ANBCTR (SUTURE) IMPLANT
SUT VIC AB 1 CT1 27 (SUTURE)
SUT VIC AB 1 CT1 27XBRD ANBCTR (SUTURE) IMPLANT
SUT VIC AB 1 CT1 36 (SUTURE) ×2 IMPLANT
SUT VIC AB 2-0 SH 27 (SUTURE) ×6
SUT VIC AB 2-0 SH 27XBRD (SUTURE) ×1 IMPLANT
SUTURE FIBERWR #2 38 T-5 BLUE (SUTURE) IMPLANT
SYR 10ML ECCENTRIC (SYRINGE) ×2 IMPLANT
SYR 3ML LL SCALE MARK (SYRINGE) ×2 IMPLANT
SYR BULB 3OZ (MISCELLANEOUS) ×3 IMPLANT
SYR CONTROL 10ML LL (SYRINGE) ×2 IMPLANT
TOWEL OR 17X26 10 PK STRL BLUE (TOWEL DISPOSABLE) ×3 IMPLANT
WRAP KNEE MAXI GEL POST OP (GAUZE/BANDAGES/DRESSINGS) ×3 IMPLANT
YANKAUER SUCT BULB TIP NO VENT (SUCTIONS) ×3 IMPLANT

## 2019-07-14 NOTE — Op Note (Signed)
07/14/2019  9:27 AM  PATIENT:  Melinda Moore    PRE-OPERATIVE DIAGNOSIS: Right knee patellofemoral osteoarthritis  POST-OPERATIVE DIAGNOSIS:  Same   SURGEON:  Eulas Post, MD  PHYSICIAN ASSISTANT: Janine Ores, PA-C, present and scrubbed throughout the case, critical for completion in a timely fashion, and for retraction, instrumentation, and closure.  ANESTHESIA:   Spinal with adductor block and intraoperative injection of Marcaine and Toradol  ESTIMATED BLOOD LOSS: 100 mL  PREOPERATIVE INDICATIONS:  KERENSA Moore is a  49 y.o. female with a diagnosis of DJD RIGHT KNEE who failed conservative measures and elected for surgical management.  She did not have very much chondral change on x-rays but her MRI read full-thickness chondral loss on the lateral facet of the patellofemoral joint.  She had some mild to moderate changes on the tibiofemoral compartments, but none of them severe.  The risks benefits and alternatives were discussed with the patient preoperatively including but not limited to the risks of infection, bleeding, nerve injury, cardiopulmonary complications, the need for revision surgery, among others, and the patient was willing to proceed.  We also specifically discussed the risks for incomplete relief of pain, progression of arthritis in the medial and lateral compartments, the potential need for conversion to a total knee replacement in the future.  OPERATIVE IMPLANTS: Zimmer NexGen size 35 x 9 mm polyethylene patella, with a size 3 right gender solutions Zimmer patellofemoral joint replacement trochlear component   OPERATIVE FINDINGS: The undersurface of the patella had extensive grade 3 chondral loss, although the degree of bony exposure was fairly minimal.  The femoral trochlea also had grade 3 changes.  There was a small area on the anterior aspect of the lateral femoral condyle that had some grade 2 changes in a 0.5 x 0.5 cm region, but no exposed bone.  I do have some  concern that the degree of chondral pathology seen clinically did not substantiate the degree of narcotic requirement for preoperative pain control, including the 4 mg of Dilaudid taken regularly.  Hopefully our surgical intervention will provide some degree of structural relief.  The anterior cruciate ligament and PCL were normal. The patella tracked with a no thumb technique after all the implants were in.  PROCEDURE:  RIGHT PATELLOFEMORAL ARTHROPLASTY   OPERATIVE PROCEDURE: The patient was brought to the operating room and placed in the supine position.  Spinal anesthesia was administered. IV antibiotics were given. The lower extremity was prepped and draped in usual sterile fashion. Time out was performed. The leg was elevated and exsanguinated and a tourniquet was inflated. Medial parapatellar arthrotomy carried out through a standard approach, and the patella was everted and the osteophytes removed. I then placed the patella back neutral, moved it laterally, exposed the femoral trochlea.  I then turned my attention to the patella, and it measured 22 mm at its thickest, I performed a freehand resection of the patella, bringing it down to 14 mm.   I then measured the thickness, and the appropriate width, and it sized to a 35.  I held this in place with the jig, drilled my holes, and then placed a trial button.  I then drilled the intramedullary drill, drew a line perpendicular to [employer], and oriented my anterior cutting jig, screwed this into place, checked it with the angel wing, and resected the anterior flange and was flush without notching to the distal femur.  I then placed the sizers, and the size 3 measured the best, secured this  into place, and then reamed out the template, then I switched to the secondary shield, and drilled the lug holes as well as the distal securing transverse peg.  I then applied a trial implant, found that it was flush, the patella tracked well, and then  cleared all bony debris.  I then removed all of the instruments, and cleaned the surface, and cemented the final implants into place.  I performed a slight "internal lateral release" on the lateral patella, and also removed a 2 mm ridge of lateral bone such that it was completely flush with the patellar component.  Then once the cement had cured all loose cement was removed, the knee was taken through functional range of motion at excellent stability with excellent tracking and did not require lateral release. After copious irrigation the parapatellar tissue was repaired with Vicryl, followed by Vicryl for the subcutaneous tissue with standard closure for the skin with Steri-Strips and sterile gauze. The patient had a preoperative adductor canal nerve block. She was awakened and returned to the PACU in stable and satisfactory condition. There were no complications and She tolerated the procedure well.

## 2019-07-14 NOTE — Anesthesia Procedure Notes (Signed)
Spinal  Patient location during procedure: OR Staffing Performed: anesthesiologist  Anesthesiologist: Ellery Meroney E, MD Preanesthetic Checklist Completed: patient identified, IV checked, risks and benefits discussed, surgical consent, monitors and equipment checked, pre-op evaluation and timeout performed Spinal Block Patient position: sitting Prep: DuraPrep and site prepped and draped Patient monitoring: continuous pulse ox, blood pressure and heart rate Approach: midline Location: L3-4 Injection technique: single-shot Needle Needle type: Pencan  Needle gauge: 24 G Needle length: 9 cm Additional Notes Functioning IV was confirmed and monitors were applied. Sterile prep and drape, including hand hygiene and sterile gloves were used. The patient was positioned and the spine was prepped. The skin was anesthetized with lidocaine.  Free flow of clear CSF was obtained prior to injecting local anesthetic into the CSF. The needle was carefully withdrawn. The patient tolerated the procedure well.      

## 2019-07-14 NOTE — Transfer of Care (Signed)
Immediate Anesthesia Transfer of Care Note  Patient: [name]  Procedure(s) Performed: PATELLA-FEMORAL ARTHROPLASTY (Right Knee)  Patient Location: PACU  Anesthesia Type:Spinal and MAC combined with regional for post-op pain  Level of Consciousness: awake, alert  and patient cooperative  Airway & Oxygen Therapy: Patient Spontanous Breathing and Patient connected to face mask oxygen  Post-op Assessment: Report given to RN and Post -op Vital signs reviewed and stable  Post vital signs: stable  Last Vitals:  Vitals Value Taken Time  BP    Temp    Pulse 81 07/14/19 1010  Resp 19 07/14/19 1010  SpO2 100 % 07/14/19 1010  Vitals shown include unvalidated device data.  Last Pain:  Vitals:   07/14/19 0625  TempSrc:   PainSc: 8       Patients Stated Pain Goal: 7 (12/04/92 8546)  Complications: No apparent anesthesia complications

## 2019-07-14 NOTE — Evaluation (Signed)
Physical Therapy Evaluation Patient Details Name: Melinda Moore MRN: [id number] DOB: December 07, 1970 Today's Date: 07/14/2019   History of Present Illness  Pt is a 49 year old female s/p right patellofemoral arthroplasty with hx of MS, neuropathic pain, fibromyalgia, Bell's Palsy  Clinical Impression  Patient is s/p above surgery resulting in functional limitations due to the deficits listed below (see PT Problem List).  Patient will benefit from skilled PT to increase their independence and safety with mobility to allow discharge to the venue listed below.  Pt assisted with ambulating 80 feet with RW in hallway POD #0.  Pt plans to d/c home tomorrow.  Pt has a flight to bedroom and will need to practice steps prior to d/c.     Follow Up Recommendations Follow surgeon's recommendation for DC plan and follow-up therapies    Equipment Recommendations  Rolling walker with 5" wheels(plans to obtain RW prior to d/c)    Recommendations for Other Services       Precautions / Restrictions Precautions Precautions: Fall;Knee Required Braces or Orthoses: Knee Immobilizer - Right Restrictions Other Position/Activity Restrictions: WBAT      Mobility  Bed Mobility Overal bed mobility: Needs Assistance Bed Mobility: Supine to Sit     Supine to sit: Supervision;HOB elevated        Transfers Overall transfer level: Needs assistance Equipment used: Rolling walker (2 wheeled) Transfers: Sit to/from Stand Sit to Stand: Min guard         General transfer comment: verbal cues for hand placement  Ambulation/Gait Ambulation/Gait assistance: Min guard Gait Distance (Feet): 80 Feet Assistive device: Rolling walker (2 wheeled) Gait Pattern/deviations: Step-to pattern;Decreased stance time - right;Antalgic     General Gait Details: verbal cues for sequence, RW positioning, tends to keep RW too far forward, no symptoms reported  Stairs            Wheelchair Mobility    Modified Rankin  (Stroke Patients Only)       Balance                                             Pertinent Vitals/Pain Pain Assessment: 0-10 Pain Score: 8  Pain Descriptors / Indicators: Aching;Sore Pain Intervention(s): Repositioned;Monitored during session;RN gave pain meds during session;Limited activity within patient's tolerance    Home Living Family/patient expects to be discharged to:: Private residence Living Arrangements: Spouse/significant other   Type of Home: House Home Access: Stairs to enter Entrance Stairs-Rails: None Entrance Stairs-Number of Steps: 3-4 Home Layout: Two level Home Equipment: None      Prior Function Level of Independence: Independent               Hand Dominance        Extremity/Trunk Assessment        Lower Extremity Assessment Lower Extremity Assessment: RLE deficits/detail RLE Deficits / Details: able to perform ankle pumps, maintained KI       Communication   Communication: No difficulties  Cognition Arousal/Alertness: Awake/alert Behavior During Therapy: WFL for tasks assessed/performed Overall Cognitive Status: Within Functional Limits for tasks assessed                                        General Comments      Exercises  Assessment/Plan    PT Assessment Patient needs continued PT services  PT Problem List Decreased strength;Decreased range of motion;Decreased mobility;Decreased balance;Decreased knowledge of use of DME;Decreased knowledge of precautions;Pain       PT Treatment Interventions Stair training;Gait training;Therapeutic exercise;DME instruction;Functional mobility training;Therapeutic activities;Patient/family education    PT Goals (Current goals can be found in the Care Plan section)  Acute Rehab PT Goals PT Goal Formulation: With patient Time For Goal Achievement: 07/18/19 Potential to Achieve Goals: Good    Frequency 7X/week   Barriers to discharge         Co-evaluation               AM-PAC PT "6 Clicks" Mobility  Outcome Measure Help needed turning from your back to your side while in a flat bed without using bedrails?: None Help needed moving from lying on your back to sitting on the side of a flat bed without using bedrails?: A Little Help needed moving to and from a bed to a chair (including a wheelchair)?: A Little Help needed standing up from a chair using your arms (e.g., wheelchair or bedside chair)?: A Little Help needed to walk in hospital room?: A Little Help needed climbing 3-5 steps with a railing? : A Little 6 Click Score: 19    End of Session Equipment Utilized During Treatment: Gait belt Activity Tolerance: Patient tolerated treatment well Patient left: in chair;with call bell/phone within reach;with chair alarm set;with family/visitor present   PT Visit Diagnosis: Other abnormalities of gait and mobility (R26.89)    Time: 4401-0272 PT Time Calculation (min) (ACUTE ONLY): 17 min   Charges:   PT Evaluation $PT Eval Low Complexity: 1 Low         Kati PT, DPT Acute Rehabilitation Services Office: [place] E 07/14/2019, 4:10 PM

## 2019-07-14 NOTE — H&P (Signed)
PREOPERATIVE H&P  Chief Complaint: right knee pain  HPI: Melinda Moore is a 49 y.o. female who presents for preoperative history and physical with a diagnosis of right knee patellofemoral oa. Symptoms are rated as moderate to severe, and have been worsening.  This is significantly impairing activities of daily living.  She has elected for surgical management.   She has failed injections, activity modification, anti-inflammatories, and assistive devices.  Preoperative MRI demonstrate end stage degenerative changes with osteophyte formation, loss of joint space, subchondral sclerosis.  She has been put on dilaudid 4 mg for this it has been so severe.    Past Medical History:  Diagnosis Date  . Anemia   . Anxiety   . Bell's palsy   . Depression   . DVT (deep venous thrombosis) (HCC) 09/02/2012   [place] on 02/13/2012 showed acute DVT in left calf veins and posterior tibial veins  . Fibromyalgia   . GERD (gastroesophageal reflux disease)   . History of blood transfusion 2000  . History of trigeminal neuralgia   . HTN (hypertension)    patient denies was taking a blood pressure medication in early 2000 but it was migraines  . Leukopenia   . MS (multiple sclerosis) (HCC) 1997  . Neuropathic pain   . Peripheral edema   . Port catheter in place 12/31/2012  . Right bundle branch block    Past Surgical History:  Procedure Laterality Date  . ABDOMINAL HYSTERECTOMY    . CHOLECYSTECTOMY    . ESOPHAGOGASTRODUODENOSCOPY  2011   Dr. Jena Gauss. Possible cervical esophageal with status post disruption with passage of Maloney dilator, glycol esophageal erythema, antral erosions. Biopsy from the stomach revealed minimal chronic inactive inflammation but negative for H. pylori  . LAPAROSCOPIC GASTRIC SLEEVE RESECTION N/A 04/17/2016   Procedure: LAPAROSCOPIC GASTRIC SLEEVE RESECTION WITH UPPER ENDOSCOPY;  Surgeon: Glenna Fellows, MD;  Location: [facility];  Service: General;  Laterality: N/A;  . PORTACATH  PLACEMENT    . TUBAL LIGATION  2001   Social History   Socioeconomic History  . Marital status: Married    Spouse name: Not on file  . Number of children: 2  . Years of education: some college  . Highest education level: Not on file  Occupational History  . Occupation: disabled  Tobacco Use  . Smoking status: Former Smoker    Types: Cigarettes    Quit date: 1990    Years since quitting: 31.1  . Smokeless tobacco: Never Used  Substance and Sexual Activity  . Alcohol use: No  . Drug use: No  . Sexual activity: Not on file  Other Topics Concern  . Not on file  Social History Narrative   Lives at home with husband and 2 daughters   Right handed   Takes 1/2 caffeine pill per day   Social Determinants of Health   Financial Resource Strain:   . Difficulty of Paying Living Expenses: Not on file  Food Insecurity:   . Worried About Programme researcher, broadcasting/film/video in the Last Year: Not on file  . Ran Out of Food in the Last Year: Not on file  Transportation Needs:   . Lack of Transportation (Medical): Not on file  . Lack of Transportation (Non-Medical): Not on file  Physical Activity:   . Days of Exercise per Week: Not on file  . Minutes of Exercise per Session: Not on file  Stress:   . Feeling of Stress : Not on file  Social Connections:   .  Frequency of Communication with Friends and Family: Not on file  . Frequency of Social Gatherings with Friends and Family: Not on file  . Attends Religious Services: Not on file  . Active Member of Clubs or Organizations: Not on file  . Attends Banker Meetings: Not on file  . Marital Status: Not on file   Family History  Problem Relation Age of Onset  . Heart disease Father   . Hyperlipidemia Father   . Congestive Heart Failure Father   . Hypertension Sister   . Other Paternal Uncle        Possible colon cancer, age greater than 43  . Cirrhosis Brother        etoh  . Healthy Mother   . Healthy Daughter   . Healthy Daughter     Allergies  Allergen Reactions  . Ambien [Zolpidem Tartrate]     nightmares  . Tape Itching and Rash   Prior to Admission medications   Medication Sig Start Date End Date Taking? Authorizing Provider  buPROPion (WELLBUTRIN SR) 150 MG 12 hr tablet TAKE ONE (1) TABLET BY MOUTH 3 TIMES DAILY Patient taking differently: Take 150 mg by mouth 3 (three) times daily.  06/25/19  Yes Babs Sciara, MD  carbamazepine (TEGRETOL XR) 200 MG 12 hr tablet TAKE ONE TABLET BY MOUTH THREE TIMES DAILY AS NEEDED Patient taking differently: Take 200 mg by mouth 3 (three) times daily as needed (facial/nerve pain.).  08/19/18  Yes Luking, Jonna Coup, MD  Cholecalciferol (VITAMIN D3) 125 MCG (5000 UT) TABS Take 20,000 Units by mouth 2 (two) times daily.   Yes [provider]  Diclofenac Sodium 3 % GEL Apply 0.5 grams to 3 large joints twice daily as needed.  Maximum daily dose of 8 grams. Patient taking differently: Apply 0.5 g topically 2 (two) times daily as needed (pain.). Apply 0.5 grams to 3 large joints twice daily as needed.  Maximum daily dose of 8 grams. 04/11/18  Yes Deveshwar, Janalyn Rouse, MD  Fingolimod HCl (GILENYA) 0.5 MG CAPS Take 0.5 mg by mouth daily.    Yes [provider]  gabapentin (NEURONTIN) 300 MG capsule Take 1 capsule (300 mg total) by mouth 3 (three) times daily. Patient taking differently: Take 1,200 mg by mouth 3 (three) times daily. States 300mg  tabs-  Takes 4 tab in am, 4 at lunch, 4 at bedtime 05/24/15  Yes Luking, Scott A, MD  HYDROmorphone (DILAUDID) 4 MG tablet TAKE ONE TABLET EVERY 4 HOURS AS NEEDED FOR PAIN NO MORE THAN 4.5 TABLETS A DAY Patient taking differently: Take 4 mg by mouth every 4 (four) hours as needed (pain.). NO MORE THAN 4.5 TABLETS A DAY 06/18/19  Yes Luking, 08/16/19, MD  ibuprofen (ADVIL) 600 MG tablet Take 1 tablet (600 mg total) by mouth every 6 (six) hours as needed. 06/13/19  Yes 08/11/19, PA-C  LUTEIN PO Take 1 tablet by mouth daily.   Yes [provider]  methylphenidate (RITALIN) 10 MG tablet Take 1 tablet (10 mg total) by mouth 2 (two) times daily. Patient taking differently: Take 10 mg by mouth 2 (two) times daily as needed (attention/energy).  03/20/19  Yes 05/20/19, MD  Multiple Vitamin (MULTIVITAMIN WITH MINERALS) TABS tablet Take 1 tablet by mouth daily. ALIVE   Yes [provider]  pantoprazole (PROTONIX) 40 MG tablet Take 40 mg by mouth daily. 07/08/19  Yes [provider]  Polyethylene Glycol 400 (BLINK TEARS) 0.25 % SOLN  Place 1 drop into the right eye 2 (two) times daily as needed (dry eyes).    Yes [provider]  potassium chloride SA (K-DUR) 20 MEQ tablet TAKE ONE TABLET BY MOUTH TWICE A DAY Patient taking differently: Take 20 mEq by mouth daily.  01/19/19  Yes Luking, Jonna Coup, MD  sertraline (ZOLOFT) 50 MG tablet TAKE ONE (1) TABLET BY MOUTH EVERY DAY Patient taking differently: Take 25 mg by mouth 2 (two) times daily.  06/25/19  Yes Luking, Jonna Coup, MD  torsemide (DEMADEX) 20 MG tablet TAKE TWO TABLETS BY MOUTH IN THE MORNINGAND ONE TABLET AT NOON IF NEEDED Patient taking differently: Take 20 mg by mouth daily.  11/21/18  Yes Luking, Jonna Coup, MD  Bioflavonoid Products (BIOFLEX) TABS Take 2 tablets by mouth daily.    [provider]  HYDROmorphone (DILAUDID) 4 MG tablet Take one tablet every 4 hours as needed for pain no more than 4.5 tablets a day Patient not taking: Reported on 07/09/2019 06/18/19   Babs Sciara, MD  HYDROmorphone (DILAUDID) 4 MG tablet Take one tablet every 4 hours as needed for pain. No more than 4.5 tablets a day Patient not taking: Reported on 07/09/2019 06/18/19   Babs Sciara, MD  Lido-Capsaicin-Men-Methyl Sal 0.5-0.035-5-20 % PTCH Apply one patch to knee for 12 hr/day Patient not taking: Reported on 07/09/2019 09/04/18   Babs Sciara, MD  lidocaine (LIDODERM) 5 % Apply one patch for 12 hours per day. Remove & Discard patch within 12 hours or as  directed by MD Patient taking differently: Place 0.5 patches onto the skin 2 (two) times daily as needed (back pain.). Apply one patch for 12 hours per day. Remove & Discard patch within 12 hours or as directed by MD 09/05/18   Babs Sciara, MD  linaclotide (LINZESS) 290 MCG CAPS capsule Take 1 capsule (290 mcg total) by mouth daily. Patient not taking: Reported on 07/09/2019 02/07/19   Babs Sciara, MD  methylphenidate (RITALIN) 10 MG tablet Take 1 tablet (10 mg total) by mouth 2 (two) times daily. Patient not taking: Reported on 07/09/2019 03/20/19   Babs Sciara, MD  methylphenidate (RITALIN) 10 MG tablet Take 1 tablet (10 mg total) by mouth 2 (two) times daily. Patient not taking: Reported on 07/09/2019 03/20/19   Babs Sciara, MD  Multiple Vitamins-Minerals (EMERGEN-C IMMUNE) PACK Take 1 packet by mouth once a week.     [provider]  ondansetron (ZOFRAN) 4 MG tablet Take 1 tablet (4 mg total) by mouth every 6 (six) hours. Patient taking differently: Take 4 mg by mouth every 6 (six) hours as needed (nausea).  01/31/19   Triplett, Tammy, PA-C     Positive ROS: All other systems have been reviewed and were otherwise negative with the exception of those mentioned in the HPI and as above.  Physical Exam: General: Alert, no acute distress Cardiovascular: No pedal edema Respiratory: No cyanosis, no use of accessory musculature GI: No organomegaly, abdomen is soft and non-tender Skin: No lesions in the area of chief complaint Neurologic: Sensation intact distally Psychiatric: Patient is competent for consent with normal mood and affect Lymphatic: No axillary or cervical lymphadenopathy  MUSCULOSKELETAL: right knee ROM 10-95 with pain anteriorly  Assessment: Right knee patellofemoral full thickness chondral loss, history of DVT   Plan: Plan for Procedure(s): PATELLOFEMORAL ARTHROPLASTY  The risks benefits and alternatives were discussed with the patient including but  not limited to the risks of nonoperative  treatment, versus surgical intervention including infection, bleeding, nerve injury,  blood clots, cardiopulmonary complications, morbidity, mortality, among others, and they were willing to proceed.   Anticipated LOS equal to or greater than 2 midnights due to - Age 37 and older with one or more of the following:  - Obesity  - Expected need for hospital services (PT, OT, Nursing) required for safe  discharge  - Anticipated need for postoperative skilled nursing care or inpatient rehab  - Active co-morbidities: DVT/VTE     Johnny Bridge, MD Cell [phone]   07/14/2019 7:18 AM

## 2019-07-14 NOTE — Anesthesia Procedure Notes (Signed)
Anesthesia Regional Block: Adductor canal block   Pre-Anesthetic Checklist: ,, timeout performed, Correct Patient, Correct Site, Correct Laterality, Correct Procedure, Correct Position, site marked, Risks and benefits discussed,  Surgical consent,  Pre-op evaluation,  At surgeon's request and post-op pain management  Laterality: Right  Prep: chloraprep       Needles:  Injection technique: Single-shot  Needle Type: Echogenic Stimulator Needle     Needle Length: 10cm  Needle Gauge: 21     Additional Needles:   Procedures:,,,, ultrasound used (permanent image in chart),,,,  Narrative:  Start time: 07/14/2019 7:15 AM End time: 07/14/2019 7:21 AM Injection made incrementally with aspirations every 5 mL.  Performed by: Personally  Anesthesiologist: Lucretia Kern, MD  Additional Notes: Monitors applied. Injection made in 5cc increments. No resistance to injection. Good needle visualization. Patient tolerated procedure well.

## 2019-07-14 NOTE — Anesthesia Postprocedure Evaluation (Signed)
Anesthesia Post Note  Patient: Melinda Moore  Procedure(s) Performed: PATELLA-FEMORAL ARTHROPLASTY (Right Knee)     Patient location during evaluation: PACU Anesthesia Type: Spinal Level of consciousness: oriented and awake and alert Pain management: pain level controlled Vital Signs Assessment: post-procedure vital signs reviewed and stable Respiratory status: spontaneous breathing, respiratory function stable and nonlabored ventilation Cardiovascular status: blood pressure returned to baseline and stable Postop Assessment: no headache, no backache, no apparent nausea or vomiting and spinal receding Anesthetic complications: no    Last Vitals:  Vitals:   07/14/19 1130 07/14/19 1140  BP: 118/79 115/73  Pulse: 90 88  Resp: 13 14  Temp:  (!) 36.4 C  SpO2: 100% 100%    Last Pain:  Vitals:   07/14/19 1140  TempSrc: Oral  PainSc:                  Lucretia Kern

## 2019-07-14 NOTE — Progress Notes (Signed)
PHARMACIST - PHYSICIAN ORDER COMMUNICATION  CONCERNING: P&T Medication Policy on Herbal Medications  DESCRIPTION:  This patient's order for:  Lutein  has been noted.  This product(s) is classified as an "herbal" or natural product. Due to a lack of definitive safety studies or FDA approval, nonstandard manufacturing practices, plus the potential risk of unknown drug-drug interactions while on inpatient medications, the Pharmacy and Therapeutics Committee does not permit the use of "herbal" or natural products of this type within [place].   ACTION TAKEN: The pharmacy department is unable to verify this order at this time and your patient has been informed of this safety policy. Please reevaluate patient's clinical condition at discharge and address if the herbal or natural product(s) should be resumed at that time.  

## 2019-07-14 NOTE — Discharge Instructions (Signed)

## 2019-07-15 ENCOUNTER — Encounter: Payer: Self-pay | Admitting: *Deleted

## 2019-07-15 LAB — CBC
HCT: 33 % — ABNORMAL LOW (ref 36.0–46.0)
Hemoglobin: 10.4 g/dL — ABNORMAL LOW (ref 12.0–15.0)
MCH: 27.9 pg (ref 26.0–34.0)
MCHC: 31.5 g/dL (ref 30.0–36.0)
MCV: 88.5 fL (ref 80.0–100.0)
Platelets: 255 10*3/uL (ref 150–400)
RBC: 3.73 MIL/uL — ABNORMAL LOW (ref 3.87–5.11)
RDW: 14.8 % (ref 11.5–15.5)
WBC: 7.5 10*3/uL (ref 4.0–10.5)
nRBC: 0 % (ref 0.0–0.2)

## 2019-07-15 LAB — BASIC METABOLIC PANEL
Anion gap: 10 (ref 5–15)
BUN: 10 mg/dL (ref 6–20)
CO2: 23 mmol/L (ref 22–32)
Calcium: 8.3 mg/dL — ABNORMAL LOW (ref 8.9–10.3)
Chloride: 104 mmol/L (ref 98–111)
Creatinine, Ser: 1 mg/dL (ref 0.44–1.00)
GFR calc Af Amer: >60 mL/min (ref >60)
GFR calc non Af Amer: >60 mL/min (ref >60)
Glucose, Bld: 132 mg/dL — ABNORMAL HIGH (ref 70–99)
Potassium: 3.4 mmol/L — ABNORMAL LOW (ref 3.5–5.1)
Sodium: 137 mmol/L (ref 135–145)

## 2019-07-15 NOTE — Progress Notes (Signed)
     Subjective: 49 yo female 1 day post-op from right patellofemoral arthroplasty. Patient notes pain is moderate this morning. Patient lying comfortably in bed. Denies shortness of breath, calf pain, nausea, vomiting, or abdominal pain.   Objective:   VITALS:   Vitals:   07/14/19 1819 07/14/19 2122 07/15/19 0206 07/15/19 0540  BP: 106/79 129/73 116/74 108/70  Pulse: 90 90 89 90  Resp: 14 18 19 18   Temp: 98.2 F (36.8 C) (!) 97.4 F (36.3 C) 99.5 F (37.5 C) 98.7 F (37.1 C)  TempSrc:  Oral Oral Oral  SpO2: 100% 100% 99% 100%  Weight:      Height:       PE: ABD soft Sensation intact distally Intact pulses distally Dorsiflexion/Plantar flexion intact Incision: dressing C/D/I  Lab Results  Component Value Date   WBC 7.5 07/15/2019   HGB 10.4 (L) 07/15/2019   HCT 33.0 (L) 07/15/2019   MCV 88.5 07/15/2019   PLT 255 07/15/2019   BMET    Component Value Date/Time   NA 137 07/15/2019 0446   NA 139 01/24/2018 0908   K 3.4 (L) 07/15/2019 0446   CL 104 07/15/2019 0446   CO2 23 07/15/2019 0446   GLUCOSE 132 (H) 07/15/2019 0446   BUN 10 07/15/2019 0446   BUN 11 01/24/2018 0908   CREATININE 1.00 07/15/2019 0446   CALCIUM 8.3 (L) 07/15/2019 0446   GFRNONAA >60 07/15/2019 0446   GFRAA >60 07/15/2019 0446     Assessment/Plan: 1 Day Post-Op - Right patellofemoral arthroplasty  Principal Problem:   Patellofemoral arthritis of right knee - Per PT, patient to be seen today and will practice steps prior to discharge - WBAT RLE - Patient has ride home and adequate home support to be discharged today, plan to discharge after PT - knee immobilizer may be removed prior to discharge - continue Lovenox at home for 1 month for DVT prophylaxis - Patient to follow up with Dr. 09/12/2019 in 2 weeks outpatient  Dion Saucier 07/15/2019, 7:52 AM   09/12/2019, MD Cell [phone]

## 2019-07-15 NOTE — Discharge Summary (Signed)
Discharge Summary  Patient ID: Melinda Moore MRN: [id number] DOB/AGE: 1971/02/20 49 y.o.  Admit date: 07/14/2019 Discharge date: 07/15/2019  Admission Diagnoses:  Patellofemoral arthritis of right knee  Discharge Diagnoses:  Principal Problem:   Patellofemoral arthritis of right knee   Past Medical History:  Diagnosis Date  . Anemia   . Anxiety   . Bell's palsy   . Depression   . DVT (deep venous thrombosis) (HCC) 09/02/2012   [place] on 02/13/2012 showed acute DVT in left calf veins and posterior tibial veins  . Fibromyalgia   . GERD (gastroesophageal reflux disease)   . History of blood transfusion 2000  . History of trigeminal neuralgia   . HTN (hypertension)    patient denies was taking a blood pressure medication in early 2000 but it was migraines  . Leukopenia   . MS (multiple sclerosis) (HCC) 1997  . Neuropathic pain   . Peripheral edema   . Port catheter in place 12/31/2012  . Right bundle branch block     Surgeries: Procedure(s): PATELLA-FEMORAL ARTHROPLASTY on 07/14/2019   Consultants (if any):   Discharged Condition: Improved  Hospital Course: Melinda Moore is an 49 y.o. female who was admitted 07/14/2019 with a diagnosis of Patellofemoral arthritis of right knee and went to the operating room on 07/14/2019 and underwent the above named procedures.    She was given perioperative antibiotics:  Anti-infectives (From admission, onward)   Start     Dose/Rate Route Frequency Ordered Stop   07/14/19 1400  ceFAZolin (ANCEF) IVPB 2g/100 mL premix     2 g 200 mL/hr over 30 Minutes Intravenous Every 6 hours 07/14/19 1204 07/14/19 2024   07/14/19 0600  ceFAZolin (ANCEF) IVPB 2g/100 mL premix     2 g 200 mL/hr over 30 Minutes Intravenous On call to O.R. 07/14/19 4193 07/14/19 0749    .  She was given sequential compression devices, early ambulation, and Lovenox for DVT prophylaxis.  She benefited maximally from the hospital stay and there were no complications.    Recent vital  signs:  Vitals:   07/15/19 0206 07/15/19 0540  BP: 116/74 108/70  Pulse: 89 90  Resp: 19 18  Temp: 99.5 F (37.5 C) 98.7 F (37.1 C)  SpO2: 99% 100%    Recent laboratory studies:  Lab Results  Component Value Date   HGB 10.4 (L) 07/15/2019   HGB 12.1 07/14/2019   HGB 11.8 (L) 07/10/2019   Lab Results  Component Value Date   WBC 7.5 07/15/2019   PLT 255 07/15/2019   No results found for: INR Lab Results  Component Value Date   NA 137 07/15/2019   K 3.4 (L) 07/15/2019   CL 104 07/15/2019   CO2 23 07/15/2019   BUN 10 07/15/2019   CREATININE 1.00 07/15/2019   GLUCOSE 132 (H) 07/15/2019    Discharge Medications:   Allergies as of 07/15/2019      Reactions   Ambien [zolpidem Tartrate]    nightmares   Tape Itching, Rash      Medication List    STOP taking these medications   Diclofenac Sodium 3 % Gel   ibuprofen 600 MG tablet Commonly known as: ADVIL   Lido-Capsaicin-Men-Methyl Sal 0.5-0.035-5-20 % Ptch   lidocaine 5 % Commonly known as: Lidoderm   linaclotide 290 MCG Caps capsule Commonly known as: Linzess     TAKE these medications   baclofen 10 MG tablet Commonly known as: LIORESAL Take 1 tablet (10 mg total) by  mouth 3 (three) times daily. As needed for muscle spasm   Bioflex Tabs Take 2 tablets by mouth daily.   Blink Tears 0.25 % Soln Generic drug: Polyethylene Glycol 400 Place 1 drop into the right eye 2 (two) times daily as needed (dry eyes).   buPROPion 150 MG 12 hr tablet Commonly known as: WELLBUTRIN SR TAKE ONE (1) TABLET BY MOUTH 3 TIMES DAILY What changed: See the new instructions.   carbamazepine 200 MG 12 hr tablet Commonly known as: TEGRETOL XR TAKE ONE TABLET BY MOUTH THREE TIMES DAILY AS NEEDED What changed: reasons to take this   Emergen-C Immune Pack Take 1 packet by mouth once a week.   gabapentin 300 MG capsule Commonly known as: NEURONTIN Take 1 capsule (300 mg total) by mouth 3 (three) times daily. What changed:    how much to take  additional instructions   Gilenya 0.5 MG Caps Generic drug: Fingolimod HCl Take 0.5 mg by mouth daily.   HYDROmorphone 4 MG tablet Commonly known as: Dilaudid Take 1 tablet (4 mg total) by mouth every 4 (four) hours as needed for severe pain (No more than 4.5 tablets per day.). What changed:   how much to take  how to take this  when to take this  reasons to take this  additional instructions  Another medication with the same name was removed. Continue taking this medication, and follow the directions you see here.   LUTEIN PO Take 1 tablet by mouth daily.   methylphenidate 10 MG tablet Commonly known as: Ritalin Take 1 tablet (10 mg total) by mouth 2 (two) times daily. What changed:   when to take this  reasons to take this  Another medication with the same name was removed. Continue taking this medication, and follow the directions you see here.   multivitamin with minerals Tabs tablet Take 1 tablet by mouth daily. ALIVE   ondansetron 4 MG tablet Commonly known as: ZOFRAN Take 1 tablet (4 mg total) by mouth every 6 (six) hours. What changed:   when to take this  reasons to take this   ondansetron 4 MG tablet Commonly known as: Zofran Take 1 tablet (4 mg total) by mouth every 8 (eight) hours as needed for nausea or vomiting. What changed: You were already taking a medication with the same name, and this prescription was added. Make sure you understand how and when to take each.   pantoprazole 40 MG tablet Commonly known as: PROTONIX Take 40 mg by mouth daily.   potassium chloride SA 20 MEQ tablet Commonly known as: KLOR-CON TAKE ONE TABLET BY MOUTH TWICE A DAY What changed: when to take this   sertraline 50 MG tablet Commonly known as: ZOLOFT TAKE ONE (1) TABLET BY MOUTH EVERY DAY What changed: See the new instructions.   torsemide 20 MG tablet Commonly known as: DEMADEX TAKE TWO TABLETS BY MOUTH IN THE MORNINGAND ONE  TABLET AT NOON IF NEEDED What changed: See the new instructions.   Vitamin D3 125 MCG (5000 UT) Tabs Take 20,000 Units by mouth 2 (two) times daily.       Diagnostic Studies: DG Knee Right Port  Result Date: 07/14/2019 CLINICAL DATA:  Patellofemoral arthritis of the right knee. EXAM: PORTABLE RIGHT KNEE - 1-2 VIEW COMPARISON:  MRI dated 05/16/2018 FINDINGS: AP and lateral views of the right knee demonstrate the patient has undergone right patellofemoral arthroplasty. Components appear in good position. Expected postsurgical air in the joint. No fractures. IMPRESSION: Satisfactory  appearance of the right knee after patellofemoral arthroplasty. Electronically Signed   By: Lorriane Shire M.D.   On: 07/14/2019 10:59    Disposition: Discharge disposition: 01-Home or Self Care         Follow-up Information    Marchia Bond, MD. Schedule an appointment as soon as possible for a visit in 2 weeks.   Specialty: Orthopedic Surgery Contact information: [place] [address] [phone]            Signed: Jola Baptist 07/15/2019, 7:59 AM

## 2019-07-15 NOTE — Progress Notes (Signed)
Discharge instructions discussed with patient and family, verbalized agreement and understanding 

## 2019-07-15 NOTE — Progress Notes (Signed)
Physical Therapy Treatment Patient Details Name: Melinda Moore MRN: [id number] DOB: 09-09-1970 Today's Date: 07/15/2019    History of Present Illness Pt is a 49 year old female s/p right patellofemoral arthroplasty with hx of MS, neuropathic pain, fibromyalgia, Bell's Palsy    PT Comments    Pt ambulated in hallway and practiced safe stair technique.  Pt provided with HEP for home exercises and reviewed verbally however pt did not wish to perform.  Pt reports understanding and had no questions.  Pt would like OP PT however recommended she call Dr. Shelba Flake office for referral if this was not set up in hospital.  Pt feels ready for d/c home today.    Follow Up Recommendations  Follow surgeon's recommendation for DC plan and follow-up therapies     Equipment Recommendations  None recommended by PT(spouse obtained RW)    Recommendations for Other Services       Precautions / Restrictions Precautions Precautions: Fall;Knee Restrictions Other Position/Activity Restrictions: WBAT    Mobility  Bed Mobility Overal bed mobility: Needs Assistance Bed Mobility: Supine to Sit     Supine to sit: Supervision        Transfers Overall transfer level: Needs assistance Equipment used: Rolling walker (2 wheeled) Transfers: Sit to/from Stand Sit to Stand: Supervision         General transfer comment: verbal cues for hand placement  Ambulation/Gait Ambulation/Gait assistance: Min guard Gait Distance (Feet): 80 Feet Assistive device: Rolling walker (2 wheeled) Gait Pattern/deviations: Step-to pattern;Decreased stance time - right;Antalgic     General Gait Details: verbal cues for sequence, RW positioning, tends to keep RW too far forward, no symptoms reported   Stairs Stairs: Yes Stairs assistance: Min guard Stair Management: Backwards;Step to pattern;With walker Number of Stairs: 2 General stair comments: verbal cues for safety and sequence; performed backwards with RW and  then forwards with rail; pt able to verbalize correct sequencing, provided handout   Wheelchair Mobility    Modified Rankin (Stroke Patients Only)       Balance                                            Cognition Arousal/Alertness: Awake/alert Behavior During Therapy: WFL for tasks assessed/performed Overall Cognitive Status: Within Functional Limits for tasks assessed                                        Exercises      General Comments        Pertinent Vitals/Pain Pain Assessment: 0-10 Pain Score: 5  Pain Location: right knee Pain Descriptors / Indicators: Aching;Sore Pain Intervention(s): Monitored during session;Repositioned;Premedicated before session    Home Living                      Prior Function            PT Goals (current goals can now be found in the care plan section) Progress towards PT goals: Progressing toward goals    Frequency    7X/week      PT Plan Current plan remains appropriate    Co-evaluation              AM-PAC PT "6 Clicks" Mobility   Outcome Measure  Help needed turning  from your back to your side while in a flat bed without using bedrails?: None Help needed moving from lying on your back to sitting on the side of a flat bed without using bedrails?: A Little Help needed moving to and from a bed to a chair (including a wheelchair)?: A Little Help needed standing up from a chair using your arms (e.g., wheelchair or bedside chair)?: A Little Help needed to walk in hospital room?: A Little Help needed climbing 3-5 steps with a railing? : A Little 6 Click Score: 19    End of Session Equipment Utilized During Treatment: Gait belt Activity Tolerance: Patient tolerated treatment well Patient left: with call bell/phone within reach;in bed Nurse Communication: Mobility status PT Visit Diagnosis: Other abnormalities of gait and mobility (R26.89)     Time: 4628-6381 PT  Time Calculation (min) (ACUTE ONLY): 23 min  Charges:  $Gait Training: 8-22 mins                    Arlyce Dice, DPT Acute Rehabilitation Services Office: [place] E 07/15/2019, 1:27 PM

## 2019-07-23 ENCOUNTER — Encounter (HOSPITAL_COMMUNITY): Payer: Self-pay | Admitting: Physical Therapy

## 2019-07-23 ENCOUNTER — Other Ambulatory Visit: Payer: Self-pay

## 2019-07-23 ENCOUNTER — Ambulatory Visit (HOSPITAL_COMMUNITY): Payer: 59 | Attending: Orthopedic Surgery | Admitting: Physical Therapy

## 2019-07-23 DIAGNOSIS — M25661 Stiffness of right knee, not elsewhere classified: Secondary | ICD-10-CM | POA: Diagnosis not present

## 2019-07-23 DIAGNOSIS — R2689 Other abnormalities of gait and mobility: Secondary | ICD-10-CM | POA: Diagnosis not present

## 2019-07-23 DIAGNOSIS — M25561 Pain in right knee: Secondary | ICD-10-CM | POA: Insufficient documentation

## 2019-07-23 NOTE — Therapy (Signed)
[facility] [address] [place], [place], [address] Phone: [phone]   Fax:  [phone]  Physical Therapy Evaluation  Patient Details  Name: Melinda Moore MRN: [id number] Date of Birth: Feb 24, 1971 Referring Provider (PT): Teryl Lucy MD   Encounter Date: 07/23/2019  PT End of Session - 07/23/19 1115    Visit Number  1    Number of Visits  16    Date for PT Re-Evaluation  09/18/19   reassess 08/21/19   Authorization Type  [employer] (no auth/ 60 visit limit)    Authorization Time Period  07/23/19-09/18/19    PT Start Time  1030    PT Stop Time  1110    PT Time Calculation (min)  40 min    Equipment Utilized During Treatment  Right knee immobilizer    Activity Tolerance  Patient tolerated treatment well    Behavior During Therapy  [facility] for tasks assessed/performed       Past Medical History:  Diagnosis Date  . Anemia   . Anxiety   . Bell's palsy   . Depression   . DVT (deep venous thrombosis) (HCC) 09/02/2012   [place] on 02/13/2012 showed acute DVT in left calf veins and posterior tibial veins  . Fibromyalgia   . GERD (gastroesophageal reflux disease)   . History of blood transfusion 2000  . History of trigeminal neuralgia   . HTN (hypertension)    patient denies was taking a blood pressure medication in early 2000 but it was migraines  . Leukopenia   . MS (multiple sclerosis) (HCC) 1997  . Neuropathic pain   . Peripheral edema   . Port catheter in place 12/31/2012  . Right bundle branch block     Past Surgical History:  Procedure Laterality Date  . ABDOMINAL HYSTERECTOMY    . CHOLECYSTECTOMY    . ESOPHAGOGASTRODUODENOSCOPY  2011   Dr. Jena Gauss. Possible cervical esophageal with status post disruption with passage of Maloney dilator, glycol esophageal erythema, antral erosions. Biopsy from the stomach revealed minimal chronic inactive inflammation but negative for H. pylori  . LAPAROSCOPIC GASTRIC SLEEVE RESECTION N/A  04/17/2016   Procedure: LAPAROSCOPIC GASTRIC SLEEVE RESECTION WITH UPPER ENDOSCOPY;  Surgeon: Glenna Fellows, MD;  Location: [facility];  Service: General;  Laterality: N/A;  . PATELLA-FEMORAL ARTHROPLASTY Right 07/14/2019   Procedure: PATELLA-FEMORAL ARTHROPLASTY;  Surgeon: Teryl Lucy, MD;  Location: [facility];  Service: Orthopedics;  Laterality: Right;  . PORTACATH PLACEMENT    . TUBAL LIGATION  2001    There were no vitals filed for this visit.   Subjective Assessment - 07/23/19 1040    Subjective  Patient presents to physical therapy with complaint of RT knee pain s/p RT patellofemoral arthroplasty on 07/14/19. Patient says she is doing well overall, and has been ambulating well with quad cane, but notes continued swelling which has concerned her. Patient says she has been able to ambulate stairs in her home using rail. Patient says she has been managing sx with pain medication, icing, and Tylenol. Patient says she has been compliant with HEP from hospitals with no issues.    Limitations  Sitting;Lifting;Standing;Walking;House hold activities    How long can you stand comfortably?  1-2 minutes    How long can you walk comfortably?  2-3 minutes    Patient Stated Goals  walk normal, stand flat footed, be pain free    Currently in Pain?  Yes    Pain Score  5  Pain Location  Knee    Pain Orientation  Right;Anterior    Pain Descriptors / Indicators  Aching;Dull    Pain Type  Surgical pain    Pain Onset  1 to 4 weeks ago    Pain Frequency  Constant    Aggravating Factors   standing, walking, bending    Pain Relieving Factors  rest, meds, ice    Effect of Pain on Daily Activities  Limiting         [facility] PT Assessment - 07/23/19 0001      Assessment   Medical Diagnosis  RT knee pain s/p RT patellofemoral arthroplasty    Referring Provider (PT)  Teryl Lucy MD    Onset Date/Surgical Date  07/14/19    Next MD Visit  07/27/19    Prior Therapy  No      Precautions   Precautions  None       Restrictions   Weight Bearing Restrictions  No      Balance Screen   Has the patient fallen in the past 6 months  No      Home Environment   Living Environment  Private residence    Living Arrangements  Spouse/significant other;Children    Type of Home  House    Home Access  Stairs to enter    Entrance Stairs-Number of Steps  2    Entrance Stairs-Rails  None      Prior Function   Level of Independence  Independent      Cognition   Overall Cognitive Status  Within Functional Limits for tasks assessed      Observation/Other Assessments   Observations  --   bandages intact, no signs of drainage noted    Focus on Therapeutic Outcomes (FOTO)   46% limited       Observation/Other Assessments-Edema    Edema  --   mod edema noted diffuse about RT knee joint      Sensation   Light Touch  Appears Intact      ROM / Strength   AROM / PROM / Strength  AROM;Strength      AROM   AROM Assessment Site  Knee    Right/Left Knee  Right;Left    Right Knee Extension  17    Right Knee Flexion  86      Strength   Strength Assessment Site  Hip;Knee;Ankle    Right/Left Hip  Right;Left    Right Hip Flexion  4/5    Right Hip Extension  4-/5    Right Hip ABduction  4-/5    Left Hip Flexion  5/5    Left Hip Extension  4+/5    Left Hip ABduction  5/5    Right/Left Knee  Right;Left    Right Knee Flexion  4-/5    Right Knee Extension  3+/5    Left Knee Flexion  5/5    Left Knee Extension  5/5    Right/Left Ankle  Right;Left    Right Ankle Dorsiflexion  5/5    Left Ankle Dorsiflexion  5/5      Ambulation/Gait   Ambulation/Gait  Yes    Ambulation/Gait Assistance  6: Modified independent (Device/Increase time)    Assistive device  Large base quad cane    Gait Pattern  Decreased stance time - right;Decreased step length - right;Decreased hip/knee flexion - right;Decreased stride length;Antalgic    Ambulation Surface  Level;Indoor    Gait velocity  decreased      Balance  Balance Assessed  Yes      Static Standing Balance   Static Standing Balance -  Activities   Tandam Stance - Right Leg;Tandam Stance - Left Leg    Static Standing - Comment/# of Minutes  3 sec; 15 sec                 Objective measurements completed on examination: See above findings.              PT Education - 07/23/19 1041    Education Details  Patient educated on evaluation findings, POC and HEP    Person(s) Educated  Patient    Methods  Explanation    Comprehension  Verbalized understanding       PT Short Term Goals - 07/23/19 1904      PT SHORT TERM GOAL #1   Title  Patient will be independent with initial HEP to improve functional outcomes    Time  4    Period  Weeks    Status  New    Target Date  08/21/19      PT SHORT TERM GOAL #2   Title  Patient will improve FOTO score to <40% to indicate improvement in functional outcomes    Time  4    Period  Weeks    Status  New    Target Date  08/21/19        PT Long Term Goals - 07/23/19 1906      PT LONG TERM GOAL #1   Title  Patient will improve FOTO score to <35% to indicate improvement in functional outcomes    Time  8    Period  Weeks    Status  New    Target Date  09/18/19      PT LONG TERM GOAL #2   Title  Patient will be able to maintain tandem stance >30 seconds on BLEs to improve stability and reduce risk for falls    Time  8    Period  Weeks    Status  New    Target Date  09/18/19      PT LONG TERM GOAL #3   Title  Patient will have RT knee AROM 0-120 degrees to improve functional mobility and facilitate squatting to pick up items from floor.    Time  8    Period  Weeks    Status  New    Target Date  09/18/19      PT LONG TERM GOAL #4   Title  Patient will have equal to or > 4+/5 MMT throughout RLE to improve ability to perform functional mobility, stair ambulation and ADLs.    Time  8    Period  Weeks    Status  New    Target Date  09/18/19             Plan -  07/23/19 1900    Clinical Impression Statement  Patient is a 49 y.o. female who presents to physical therapy with complaint of RT knee pain s/p RT patellofemoral arthroplasty on 07/14/19. Patient demonstrates decreased strength, ROM restriction, balance deficits and gait abnormalities which are likely contributing to symptoms of pain and are negatively impacting patient ability to perform ADLs and functional mobility tasks. Patient will benefit from skilled physical therapy services to address these deficits to reduce pain, improve level of function with ADLs, functional mobility tasks, and reduce risk for falls.    Examination-Activity Limitations  Bathing;Lift;Stand;Locomotion Level;Bend;Transfers;Carry;Dressing;Squat;Stairs;Sleep  Examination-Participation Restrictions  Yard Work;Community Activity    Stability/Clinical Decision Making  Stable/Uncomplicated    Clinical Decision Making  Low    Rehab Potential  Good    PT Frequency  2x / week    PT Duration  8 weeks    PT Treatment/Interventions  ADLs/Self Care Home Management;Therapeutic exercise;Therapeutic activities;Patient/family education;Orthotic Fit/Training;Taping;Joint Manipulations;Splinting;Energy conservation;Neuromuscular re-education;Balance training;DME Instruction;Gait training;Stair training;Functional mobility training;Manual techniques;Compression bandaging    PT Next Visit Plan  Review goals, initate treatment. Review HEP. Progress RT knee strength and ROM as tolerated. Add SLR, glute set, heel raises, mini squats, slant board, knee drivers.    PT Home Exercise Plan  07/23/19: quad set, heel slides, ankle pump    Consulted and Agree with Plan of Care  Patient       Patient will benefit from skilled therapeutic intervention in order to improve the following deficits and impairments:  Abnormal gait, Pain, Improper body mechanics, Decreased mobility, Decreased activity tolerance, Decreased endurance, Decreased range of motion,  Decreased strength, Hypomobility, Decreased balance, Difficulty walking, Increased edema, Impaired flexibility  Visit Diagnosis: Right knee pain, unspecified chronicity  Stiffness of right knee, not elsewhere classified  Other abnormalities of gait and mobility     Problem List Patient Active Problem List   Diagnosis Date Noted  . Patellofemoral arthritis of right knee 07/14/2019  . History of DVT in adulthood 06/23/2019  . Right leg pain 03/12/2017  . Gait abnormality 03/12/2017  . Radiculopathy due to lumbar intervertebral disc disorder 09/21/2016  . Spondylosis without myelopathy or radiculopathy, lumbar region 09/21/2016  . History of anxiety 09/08/2016  . Primary osteoarthritis of right knee 08/30/2016  . Primary insomnia 08/30/2016  . Myofascial pain 08/30/2016  . Sciatica, right side 12/12/2015  . Right knee pain 12/12/2015  . Morbid obesity (HCC) 08/12/2015  . Lumbago 02/10/2014  . Chronic anxiety 07/08/2013  . Swelling of breast 04/08/2013  . Chronic pain syndrome 03/11/2013  . Port catheter in place 12/31/2012  . Unspecified constipation 10/01/2012  . Anemia 10/01/2012  . Rectal bleeding 10/01/2012  . Elevated alkaline phosphatase level 10/01/2012  . MS (multiple sclerosis) (HCC) 09/02/2012  . DVT (deep venous thrombosis) (HCC) 09/02/2012  . ANEMIA 04/12/2010  . NAUSEA WITH VOMITING 04/12/2010  . OTHER DYSPHAGIA 04/12/2010   7:10 PM, 07/23/19 Georges Lynch PT DPT  Physical Therapist with [place]  [facility]  [phone]   [facility] [address] [place], [place], [address] Phone: [phone]   Fax:  [phone]  Name: Melinda Moore MRN: [id number] Date of Birth: 09/28/1970

## 2019-07-27 ENCOUNTER — Other Ambulatory Visit: Payer: Self-pay | Admitting: Family Medicine

## 2019-07-28 ENCOUNTER — Other Ambulatory Visit: Payer: Self-pay

## 2019-07-28 ENCOUNTER — Encounter (HOSPITAL_COMMUNITY): Payer: Self-pay | Admitting: Physical Therapy

## 2019-07-28 ENCOUNTER — Ambulatory Visit (HOSPITAL_COMMUNITY): Payer: 59 | Admitting: Physical Therapy

## 2019-07-28 DIAGNOSIS — M25661 Stiffness of right knee, not elsewhere classified: Secondary | ICD-10-CM

## 2019-07-28 DIAGNOSIS — M25561 Pain in right knee: Secondary | ICD-10-CM | POA: Diagnosis not present

## 2019-07-28 DIAGNOSIS — R2689 Other abnormalities of gait and mobility: Secondary | ICD-10-CM | POA: Diagnosis not present

## 2019-07-28 NOTE — Therapy (Addendum)
[place], [place], [address] Phone: [phone]   Fax:  [phone]  Physical Therapy Treatment  Patient Details  Name: JAYLIANA VALENCIA MRN: [id number] Date of Birth: 1971-03-22 Referring Provider (PT): Marchia Bond MD   Encounter Date: 07/28/2019  PT End of Session - 07/28/19 1546    Visit Number  2    Number of Visits  16    Date for PT Re-Evaluation  09/18/19   reassess 08/21/19   Authorization Type  [employer] (no auth/ 60 visit limit)    Authorization Time Period  07/23/19-09/18/19    Authorization - Visit Number  2    Authorization - Number of Visits  10    PT Start Time  1521    PT Stop Time  1605    PT Time Calculation (min)  44 min    Equipment Utilized During Treatment  Right knee immobilizer    Activity Tolerance  Patient tolerated treatment well    Behavior During Therapy  [facility] for tasks assessed/performed       Past Medical History:  Diagnosis Date  . Anemia   . Anxiety   . Bell's palsy   . Depression   . DVT (deep venous thrombosis) ([place]) 09/02/2012   [place] on 02/13/2012 showed acute DVT in left calf veins and posterior tibial veins  . Fibromyalgia   . GERD (gastroesophageal reflux disease)   . History of blood transfusion 2000  . History of trigeminal neuralgia   . HTN (hypertension)    patient denies was taking a blood pressure medication in early 2000 but it was migraines  . Leukopenia   . MS (multiple sclerosis) ([place]) 1997  . Neuropathic pain   . Peripheral edema   . Port catheter in place 12/31/2012  . Right bundle branch block     Past Surgical History:  Procedure Laterality Date  . ABDOMINAL HYSTERECTOMY    . CHOLECYSTECTOMY    . ESOPHAGOGASTRODUODENOSCOPY  2011   Dr. Gala Romney. Possible cervical esophageal with status post disruption with passage of Maloney dilator, glycol esophageal erythema, antral erosions. Biopsy from the stomach revealed minimal chronic inactive inflammation  but negative for H. pylori  . LAPAROSCOPIC GASTRIC SLEEVE RESECTION N/A 04/17/2016   Procedure: LAPAROSCOPIC GASTRIC SLEEVE RESECTION WITH UPPER ENDOSCOPY;  Surgeon: Excell Seltzer, MD;  Location: [facility];  Service: General;  Laterality: N/A;  . PATELLA-FEMORAL ARTHROPLASTY Right 07/14/2019   Procedure: PATELLA-FEMORAL ARTHROPLASTY;  Surgeon: Marchia Bond, MD;  Location: [facility];  Service: Orthopedics;  Laterality: Right;  . PORTACATH PLACEMENT    . TUBAL LIGATION  2001    There were no vitals filed for this visit.  Subjective Assessment - 07/28/19 1520    Subjective  Pt states that she has no questions on pt HEP .    Limitations  Sitting;Lifting;Standing;Walking;House hold activities    How long can you stand comfortably?  1-2 minutes    How long can you walk comfortably?  2-3 minutes    Patient Stated Goals  walk normal, stand flat footed, be pain free    Currently in Pain?  Yes    Pain Score  5     Pain Location  Knee    Pain Orientation  Right    Pain Descriptors / Indicators  Aching    Pain Type  Acute pain    Pain Onset  1 to 4 weeks ago    Aggravating Factors   pushing range  Pain Relieving Factors  ice and elevation                       [facility] Adult PT Treatment/Exercise - 07/28/19 0001      Exercises   Exercises  Knee/Hip      Knee/Hip Exercises: Stretches   Knee: Self-Stretch to increase Flexion  Right;3 reps;30 seconds    Knee: Self-Stretch Limitations  12" step     Other Knee/Hip Stretches  slant board 3 x 30"       Knee/Hip Exercises: Standing   Heel Raises  Both;15 reps    Knee Flexion  AROM;Right;10 reps    Forward Lunges  Right;10 reps    Forward Lunges Limitations  4" step       Knee/Hip Exercises: Seated   Long Arc Quad  Right;10 reps    Heel Slides  AROM;Right;10 reps      Knee/Hip Exercises: Supine   Quad Sets  Right;10 reps    Short Arc Quad Sets  Right;10 reps    Heel Slides  5 reps    Knee Extension Limitations  17    Knee  Flexion Limitations  110               PT Short Term Goals - 07/23/19 1904      PT SHORT TERM GOAL #1   Title  Patient will be independent with initial HEP to improve functional outcomes    Time  4    Period  Weeks    Status  New    Target Date  08/21/19      PT SHORT TERM GOAL #2   Title  Patient will improve FOTO score to <40% to indicate improvement in functional outcomes    Time  4    Period  Weeks    Status  New    Target Date  08/21/19        PT Long Term Goals - 07/23/19 1906      PT LONG TERM GOAL #1   Title  Patient will improve FOTO score to <35% to indicate improvement in functional outcomes    Time  8    Period  Weeks    Status  New    Target Date  09/18/19      PT LONG TERM GOAL #2   Title  Patient will be able to maintain tandem stance >30 seconds on BLEs to improve stability and reduce risk for falls    Time  8    Period  Weeks    Status  New    Target Date  09/18/19      PT LONG TERM GOAL #3   Title  Patient will have RT knee AROM 0-120 degrees to improve functional mobility and facilitate squatting to pick up items from floor.    Time  8    Period  Weeks    Status  New    Target Date  09/18/19      PT LONG TERM GOAL #4   Title  Patient will have equal to or > 4+/5 MMT throughout RLE to improve ability to perform functional mobility, stair ambulation and ADLs.    Time  8    Period  Weeks    Status  New    Target Date  09/18/19            Plan - 07/28/19 1547    Clinical Impression Statement  PT has improved significantly in  flexion but extension remains at 17.  Pt treatment focused on improving extension of right knee to improve gait mechanics and pain.    Examination-Activity Limitations  Bathing;Lift;Stand;Locomotion Level;Bend;Transfers;Carry;Dressing;Squat;Stairs;Sleep    Examination-Participation Restrictions  Yard Work;Community Activity    Stability/Clinical Decision Making  Stable/Uncomplicated    Rehab Potential  Good     PT Frequency  2x / week    PT Duration  8 weeks    PT Treatment/Interventions  ADLs/Self Care Home Management;Therapeutic exercise;Therapeutic activities;Patient/family education;Orthotic Fit/Training;Taping;Joint Manipulations;Splinting;Energy conservation;Neuromuscular re-education;Balance training;DME Instruction;Gait training;Stair training;Functional mobility training;Manual techniques;Compression bandaging    PT Next Visit Plan  . Review HEP. Progress RT knee strength and ROM as tolerated. Add SLR, glute set, heel raises, mini squats, slant board, knee drivers.    PT Home Exercise Plan  07/23/19: quad set, heel slides, ankle pump: 12/16:  LAQ, heel raises, LAQ, active hamstring stretch and heel prop for extension    Consulted and Agree with Plan of Care  Patient       Patient will benefit from skilled therapeutic intervention in order to improve the following deficits and impairments:  Abnormal gait, Pain, Improper body mechanics, Decreased mobility, Decreased activity tolerance, Decreased endurance, Decreased range of motion, Decreased strength, Hypomobility, Decreased balance, Difficulty walking, Increased edema, Impaired flexibility  Visit Diagnosis: Right knee pain, unspecified chronicity  Stiffness of right knee, not elsewhere classified     Problem List Patient Active Problem List   Diagnosis Date Noted  . Patellofemoral arthritis of right knee 07/14/2019  . History of DVT in adulthood 06/23/2019  . Right leg pain 03/12/2017  . Gait abnormality 03/12/2017  . Radiculopathy due to lumbar intervertebral disc disorder 09/21/2016  . Spondylosis without myelopathy or radiculopathy, lumbar region 09/21/2016  . History of anxiety 09/08/2016  . Primary osteoarthritis of right knee 08/30/2016  . Primary insomnia 08/30/2016  . Myofascial pain 08/30/2016  . Sciatica, right side 12/12/2015  . Right knee pain 12/12/2015  . Morbid obesity (HCC) 08/12/2015  . Lumbago 02/10/2014  .  Chronic anxiety 07/08/2013  . Swelling of breast 04/08/2013  . Chronic pain syndrome 03/11/2013  . Port catheter in place 12/31/2012  . Unspecified constipation 10/01/2012  . Anemia 10/01/2012  . Rectal bleeding 10/01/2012  . Elevated alkaline phosphatase level 10/01/2012  . MS (multiple sclerosis) (HCC) 09/02/2012  . DVT (deep venous thrombosis) (HCC) 09/02/2012  . ANEMIA 04/12/2010  . NAUSEA WITH VOMITING 04/12/2010  . OTHER DYSPHAGIA 04/12/2010    Virgina Organ, PT CLT [phone] 07/28/2019, 4:09 PM  [facility] [address] [place], [place], [address] Phone: [phone]   Fax:  [phone]  Name: SORINA DERRIG MRN: [id number] Date of Birth: Aug 12, 1970

## 2019-07-30 ENCOUNTER — Ambulatory Visit (HOSPITAL_COMMUNITY): Payer: 59 | Admitting: Physical Therapy

## 2019-08-04 ENCOUNTER — Ambulatory Visit (HOSPITAL_COMMUNITY): Payer: 59 | Admitting: Physical Therapy

## 2019-08-04 ENCOUNTER — Telehealth (HOSPITAL_COMMUNITY): Payer: Self-pay | Admitting: Physical Therapy

## 2019-08-04 NOTE — Telephone Encounter (Signed)
pt rescheduled appt for 2/24

## 2019-08-05 ENCOUNTER — Encounter (HOSPITAL_COMMUNITY): Payer: Self-pay | Admitting: Physical Therapy

## 2019-08-05 ENCOUNTER — Ambulatory Visit (HOSPITAL_COMMUNITY): Payer: 59 | Admitting: Physical Therapy

## 2019-08-05 ENCOUNTER — Other Ambulatory Visit: Payer: Self-pay

## 2019-08-05 DIAGNOSIS — M25561 Pain in right knee: Secondary | ICD-10-CM | POA: Diagnosis not present

## 2019-08-05 DIAGNOSIS — R2689 Other abnormalities of gait and mobility: Secondary | ICD-10-CM

## 2019-08-05 DIAGNOSIS — M25661 Stiffness of right knee, not elsewhere classified: Secondary | ICD-10-CM

## 2019-08-05 NOTE — Therapy (Signed)
[facility] [address] [place], [place], [address] Phone: [phone]   Fax:  [phone]  Physical Therapy Treatment  Patient Details  Name: CHUNDRA SAUERWEIN MRN: [id number] Date of Birth: 1970-07-31 Referring Provider (PT): Teryl Lucy MD   Encounter Date: 08/05/2019  PT End of Session - 08/05/19 1447    Visit Number  3    Number of Visits  16    Date for PT Re-Evaluation  09/18/19   reassess 08/21/19   Authorization Type  [employer] (no auth/ 60 visit limit)    Authorization Time Period  07/23/19-09/18/19    Authorization - Visit Number  3    Authorization - Number of Visits  10    PT Start Time  1447    PT Stop Time  1527    PT Time Calculation (min)  40 min    Equipment Utilized During Treatment  Right knee immobilizer    Activity Tolerance  Patient tolerated treatment well    Behavior During Therapy  [facility] for tasks assessed/performed       Past Medical History:  Diagnosis Date  . Anemia   . Anxiety   . Bell's palsy   . Depression   . DVT (deep venous thrombosis) (HCC) 09/02/2012   [place] on 02/13/2012 showed acute DVT in left calf veins and posterior tibial veins  . Fibromyalgia   . GERD (gastroesophageal reflux disease)   . History of blood transfusion 2000  . History of trigeminal neuralgia   . HTN (hypertension)    patient denies was taking a blood pressure medication in early 2000 but it was migraines  . Leukopenia   . MS (multiple sclerosis) (HCC) 1997  . Neuropathic pain   . Peripheral edema   . Port catheter in place 12/31/2012  . Right bundle branch block     Past Surgical History:  Procedure Laterality Date  . ABDOMINAL HYSTERECTOMY    . CHOLECYSTECTOMY    . ESOPHAGOGASTRODUODENOSCOPY  2011   Dr. Jena Gauss. Possible cervical esophageal with status post disruption with passage of Maloney dilator, glycol esophageal erythema, antral erosions. Biopsy from the stomach revealed minimal chronic inactive inflammation  but negative for H. pylori  . LAPAROSCOPIC GASTRIC SLEEVE RESECTION N/A 04/17/2016   Procedure: LAPAROSCOPIC GASTRIC SLEEVE RESECTION WITH UPPER ENDOSCOPY;  Surgeon: Glenna Fellows, MD;  Location: [facility];  Service: General;  Laterality: N/A;  . PATELLA-FEMORAL ARTHROPLASTY Right 07/14/2019   Procedure: PATELLA-FEMORAL ARTHROPLASTY;  Surgeon: Teryl Lucy, MD;  Location: [facility];  Service: Orthopedics;  Laterality: Right;  . PORTACATH PLACEMENT    . TUBAL LIGATION  2001    There were no vitals filed for this visit.  Subjective Assessment - 08/05/19 1540    Subjective  Pt states that she has no questions on pt HEP. Reports 3/10 pain right in the right knee, but this is just her typical pain. States the exercises are going well especially the one standing on her tip toes. States she is going up and down the stairs a lot easier and quicker.    Limitations  Sitting;Lifting;Standing;Walking;House hold activities    How long can you stand comfortably?  1-2 minutes    How long can you walk comfortably?  2-3 minutes    Patient Stated Goals  walk normal, stand flat footed, be pain free    Currently in Pain?  Yes    Pain Score  3     Pain Location  Knee    Pain  Orientation  Right    Pain Descriptors / Indicators  Aching    Pain Onset  1 to 4 weeks ago         [facility] PT Assessment - 08/05/19 0001      Assessment   Medical Diagnosis  RT knee pain s/p RT patellofemoral arthroplasty    Referring Provider (PT)  Marchia Bond MD    Onset Date/Surgical Date  07/14/19                   [facility] Adult PT Treatment/Exercise - 08/05/19 0001      Knee/Hip Exercises: Stretches   Gastroc Stretch  --   seated - belt around back - 6 minutes in length -active TKE     Knee/Hip Exercises: Standing   Gait Training  heel toe walking - in // bars x15 laps, backwards walking - focus on TKE and upright body in // bars x15 laps B, walking focusing on tall posture without shuffleing feet - 3 laps  around gym (226 feet a lap)      Knee/Hip Exercises: Supine   Knee Extension Limitations  14    Knee Flexion Limitations  108      Knee/Hip Exercises: Prone   Hamstring Curl  5 sets   30" holds R   Prone Knee Hang  1 minute   x5 R     Manual Therapy   Manual Therapy  Joint mobilization;Soft tissue mobilization    Manual therapy comments  all manual interventions performed independently of other interventions    Joint Mobilization  R tib/fib PA and AP grade II/III into extension - tolerated well    Soft tissue mobilization  STM to R distal quad/hamstrings and proximal gastroc - tolerated well                PT Short Term Goals - 07/23/19 1904      PT SHORT TERM GOAL #1   Title  Patient will be independent with initial HEP to improve functional outcomes    Time  4    Period  Weeks    Status  New    Target Date  08/21/19      PT SHORT TERM GOAL #2   Title  Patient will improve FOTO score to <40% to indicate improvement in functional outcomes    Time  4    Period  Weeks    Status  New    Target Date  08/21/19        PT Long Term Goals - 07/23/19 1906      PT LONG TERM GOAL #1   Title  Patient will improve FOTO score to <35% to indicate improvement in functional outcomes    Time  8    Period  Weeks    Status  New    Target Date  09/18/19      PT LONG TERM GOAL #2   Title  Patient will be able to maintain tandem stance >30 seconds on BLEs to improve stability and reduce risk for falls    Time  8    Period  Weeks    Status  New    Target Date  09/18/19      PT LONG TERM GOAL #3   Title  Patient will have RT knee AROM 0-120 degrees to improve functional mobility and facilitate squatting to pick up items from floor.    Time  8    Period  Weeks  Status  New    Target Date  09/18/19      PT LONG TERM GOAL #4   Title  Patient will have equal to or > 4+/5 MMT throughout RLE to improve ability to perform functional mobility, stair ambulation and ADLs.     Time  8    Period  Weeks    Status  New    Target Date  09/18/19            Plan - 08/05/19 1554    Clinical Impression Statement  Focus today was on improving gait and terminal knee extension. Patient tolerated this very well and after prolonged stretches reported no pain with walking (and she normally has about 3/10 pain in her knee. Difficulties with laying prone secondary to tenderness along the front of her knee, but this was resolved with hanging her knee off the edge of the table. Will follow up with patient's gait and terminal knee extension next session.    Examination-Activity Limitations  Bathing;Lift;Stand;Locomotion Level;Bend;Transfers;Carry;Dressing;Squat;Stairs;Sleep    Examination-Participation Restrictions  Yard Work;Community Activity    Stability/Clinical Decision Making  Stable/Uncomplicated    Rehab Potential  Good    PT Frequency  2x / week    PT Duration  8 weeks    PT Treatment/Interventions  ADLs/Self Care Home Management;Therapeutic exercise;Therapeutic activities;Patient/family education;Orthotic Fit/Training;Taping;Joint Manipulations;Splinting;Energy conservation;Neuromuscular re-education;Balance training;DME Instruction;Gait training;Stair training;Functional mobility training;Manual techniques;Compression bandaging    PT Next Visit Plan  Progress RT knee strength and ROM as tolerated. Add SLR, glute set, heel raises, mini squats, slant board, knee drivers.    PT Home Exercise Plan  07/23/19: quad set, heel slides, ankle pump: 12/16:  LAQ, heel raises, LAQ, active hamstring stretch and heel prop for extension; 2/24 calf stretch, prone quad stretch    Consulted and Agree with Plan of Care  Patient       Patient will benefit from skilled therapeutic intervention in order to improve the following deficits and impairments:  Abnormal gait, Pain, Improper body mechanics, Decreased mobility, Decreased activity tolerance, Decreased endurance, Decreased range of  motion, Decreased strength, Hypomobility, Decreased balance, Difficulty walking, Increased edema, Impaired flexibility  Visit Diagnosis: Right knee pain, unspecified chronicity  Stiffness of right knee, not elsewhere classified  Other abnormalities of gait and mobility     Problem List Patient Active Problem List   Diagnosis Date Noted  . Patellofemoral arthritis of right knee 07/14/2019  . History of DVT in adulthood 06/23/2019  . Right leg pain 03/12/2017  . Gait abnormality 03/12/2017  . Radiculopathy due to lumbar intervertebral disc disorder 09/21/2016  . Spondylosis without myelopathy or radiculopathy, lumbar region 09/21/2016  . History of anxiety 09/08/2016  . Primary osteoarthritis of right knee 08/30/2016  . Primary insomnia 08/30/2016  . Myofascial pain 08/30/2016  . Sciatica, right side 12/12/2015  . Right knee pain 12/12/2015  . Morbid obesity (HCC) 08/12/2015  . Lumbago 02/10/2014  . Chronic anxiety 07/08/2013  . Swelling of breast 04/08/2013  . Chronic pain syndrome 03/11/2013  . Port catheter in place 12/31/2012  . Unspecified constipation 10/01/2012  . Anemia 10/01/2012  . Rectal bleeding 10/01/2012  . Elevated alkaline phosphatase level 10/01/2012  . MS (multiple sclerosis) (HCC) 09/02/2012  . DVT (deep venous thrombosis) (HCC) 09/02/2012  . ANEMIA 04/12/2010  . NAUSEA WITH VOMITING 04/12/2010  . OTHER DYSPHAGIA 04/12/2010    3:55 PM, 08/05/19 Tereasa Coop, DPT Physical Therapy with [facility]  [phone] office   [facility] [address] [place], [place], [address] Phone: [phone]   Fax:  [phone]  Name: JAKITA DUTKIEWICZ MRN: [id number] Date of Birth: May 30, 1971

## 2019-08-06 ENCOUNTER — Ambulatory Visit (HOSPITAL_COMMUNITY): Payer: 59 | Admitting: Physical Therapy

## 2019-08-06 ENCOUNTER — Other Ambulatory Visit: Payer: Self-pay

## 2019-08-06 DIAGNOSIS — R2689 Other abnormalities of gait and mobility: Secondary | ICD-10-CM

## 2019-08-06 DIAGNOSIS — M25561 Pain in right knee: Secondary | ICD-10-CM

## 2019-08-06 DIAGNOSIS — M25661 Stiffness of right knee, not elsewhere classified: Secondary | ICD-10-CM

## 2019-08-06 NOTE — Therapy (Signed)
[facility] [address] [place], [place], [address] Phone: [phone]   Fax:  [phone]  Physical Therapy Treatment  Patient Details  Name: Melinda Moore MRN: [id number] Date of Birth: 1970/09/15 Referring Provider (PT): Teryl Lucy MD   Encounter Date: 08/06/2019  PT End of Session - 08/06/19 1228    Visit Number  4    Number of Visits  16    Date for PT Re-Evaluation  09/18/19   reassess 08/21/19   Authorization Type  [employer] (no auth/ 60 visit limit)    Authorization Time Period  07/23/19-09/18/19    Authorization - Visit Number  4    Authorization - Number of Visits  10    PT Start Time  1139    PT Stop Time  1222    PT Time Calculation (min)  43 min    Equipment Utilized During Treatment  Right knee immobilizer    Activity Tolerance  Patient tolerated treatment well    Behavior During Therapy  [facility] for tasks assessed/performed       Past Medical History:  Diagnosis Date  . Anemia   . Anxiety   . Bell's palsy   . Depression   . DVT (deep venous thrombosis) (HCC) 09/02/2012   [place] on 02/13/2012 showed acute DVT in left calf veins and posterior tibial veins  . Fibromyalgia   . GERD (gastroesophageal reflux disease)   . History of blood transfusion 2000  . History of trigeminal neuralgia   . HTN (hypertension)    patient denies was taking a blood pressure medication in early 2000 but it was migraines  . Leukopenia   . MS (multiple sclerosis) (HCC) 1997  . Neuropathic pain   . Peripheral edema   . Port catheter in place 12/31/2012  . Right bundle branch block     Past Surgical History:  Procedure Laterality Date  . ABDOMINAL HYSTERECTOMY    . CHOLECYSTECTOMY    . ESOPHAGOGASTRODUODENOSCOPY  2011   Dr. Jena Gauss. Possible cervical esophageal with status post disruption with passage of Maloney dilator, glycol esophageal erythema, antral erosions. Biopsy from the stomach revealed minimal chronic inactive inflammation  but negative for H. pylori  . LAPAROSCOPIC GASTRIC SLEEVE RESECTION N/A 04/17/2016   Procedure: LAPAROSCOPIC GASTRIC SLEEVE RESECTION WITH UPPER ENDOSCOPY;  Surgeon: Glenna Fellows, MD;  Location: [facility];  Service: General;  Laterality: N/A;  . PATELLA-FEMORAL ARTHROPLASTY Right 07/14/2019   Procedure: PATELLA-FEMORAL ARTHROPLASTY;  Surgeon: Teryl Lucy, MD;  Location: [facility];  Service: Orthopedics;  Laterality: Right;  . PORTACATH PLACEMENT    . TUBAL LIGATION  2001    There were no vitals filed for this visit.  Subjective Assessment - 08/06/19 1144    Subjective  pt states just a little stiffness/soreness but really no pain.    Currently in Pain?  No/denies                       [facility] Adult PT Treatment/Exercise - 08/06/19 0001      Knee/Hip Exercises: Stretches   Active Hamstring Stretch  Right;3 reps;30 seconds    Active Hamstring Stretch Limitations  on 12" step    Knee: Self-Stretch to increase Flexion  Right;10 seconds    Knee: Self-Stretch Limitations  10 reps on 12" step      Knee/Hip Exercises: Standing   Heel Raises  Both;15 reps    Gait Training  around gym 2 laps working on larger stride,  heel to toe without shuffling, arm swing    Other Standing Knee Exercises  retro amb on line 2RT, sidestepping on line 2RT      Knee/Hip Exercises: Supine   Short Arc Quad Sets  Right;10 reps    Knee Extension Limitations  6    Knee Flexion Limitations  117      Knee/Hip Exercises: Prone   Prone Knee Hang  2 minutes    Prone Knee Hang Limitations  with manual posteiror knee      Manual Therapy   Manual Therapy  Soft tissue mobilization;Myofascial release    Manual therapy comments  all manual interventions performed independently of other interventions    Soft tissue mobilization  STM to R distal quad/hamstrings and proximal gastroc - tolerated well     Myofascial Release  to posterior knee and scar tissue to reduce adhesions               PT  Short Term Goals - 07/23/19 1904      PT SHORT TERM GOAL #1   Title  Patient will be independent with initial HEP to improve functional outcomes    Time  4    Period  Weeks    Status  New    Target Date  08/21/19      PT SHORT TERM GOAL #2   Title  Patient will improve FOTO score to <40% to indicate improvement in functional outcomes    Time  4    Period  Weeks    Status  New    Target Date  08/21/19        PT Long Term Goals - 07/23/19 1906      PT LONG TERM GOAL #1   Title  Patient will improve FOTO score to <35% to indicate improvement in functional outcomes    Time  8    Period  Weeks    Status  New    Target Date  09/18/19      PT LONG TERM GOAL #2   Title  Patient will be able to maintain tandem stance >30 seconds on BLEs to improve stability and reduce risk for falls    Time  8    Period  Weeks    Status  New    Target Date  09/18/19      PT LONG TERM GOAL #3   Title  Patient will have RT knee AROM 0-120 degrees to improve functional mobility and facilitate squatting to pick up items from floor.    Time  8    Period  Weeks    Status  New    Target Date  09/18/19      PT LONG TERM GOAL #4   Title  Patient will have equal to or > 4+/5 MMT throughout RLE to improve ability to perform functional mobility, stair ambulation and ADLs.    Time  8    Period  Weeks    Status  New    Target Date  09/18/19            Plan - 08/06/19 1228    Clinical Impression Statement  Began session with gait working on heel/toe and reduced shuffling.  Encouraged to take larger, slower steps.  continued with stretches, added hamstring in standing to work on improving knee extension.  manual completed to posterior knee with hangs to reduce adhesions.  ROM increased at end of session 6-117 today.  Pt very pleased with increased motion.  Cues for posturing throughout session.    Examination-Activity Limitations  Bathing;Lift;Stand;Locomotion  Level;Bend;Transfers;Carry;Dressing;Squat;Stairs;Sleep    Examination-Participation Restrictions  Yard Work;Community Activity    Stability/Clinical Decision Making  Stable/Uncomplicated    Rehab Potential  Good    PT Frequency  2x / week    PT Duration  8 weeks    PT Treatment/Interventions  ADLs/Self Care Home Management;Therapeutic exercise;Therapeutic activities;Patient/family education;Orthotic Fit/Training;Taping;Joint Manipulations;Splinting;Energy conservation;Neuromuscular re-education;Balance training;DME Instruction;Gait training;Stair training;Functional mobility training;Manual techniques;Compression bandaging    PT Next Visit Plan  Progress RT knee strength and ROM as tolerated. Add SLR, glute set, heel raises, mini squats, slant board, knee drivers.    PT Home Exercise Plan  07/23/19: quad set, heel slides, ankle pump: 12/16:  LAQ, heel raises, LAQ, active hamstring stretch and heel prop for extension; 2/24 calf stretch, prone quad stretch    Consulted and Agree with Plan of Care  Patient       Patient will benefit from skilled therapeutic intervention in order to improve the following deficits and impairments:  Abnormal gait, Pain, Improper body mechanics, Decreased mobility, Decreased activity tolerance, Decreased endurance, Decreased range of motion, Decreased strength, Hypomobility, Decreased balance, Difficulty walking, Increased edema, Impaired flexibility  Visit Diagnosis: Right knee pain, unspecified chronicity  Stiffness of right knee, not elsewhere classified  Other abnormalities of gait and mobility     Problem List Patient Active Problem List   Diagnosis Date Noted  . Patellofemoral arthritis of right knee 07/14/2019  . History of DVT in adulthood 06/23/2019  . Right leg pain 03/12/2017  . Gait abnormality 03/12/2017  . Radiculopathy due to lumbar intervertebral disc disorder 09/21/2016  . Spondylosis without myelopathy or radiculopathy, lumbar region  09/21/2016  . History of anxiety 09/08/2016  . Primary osteoarthritis of right knee 08/30/2016  . Primary insomnia 08/30/2016  . Myofascial pain 08/30/2016  . Sciatica, right side 12/12/2015  . Right knee pain 12/12/2015  . Morbid obesity (HCC) 08/12/2015  . Lumbago 02/10/2014  . Chronic anxiety 07/08/2013  . Swelling of breast 04/08/2013  . Chronic pain syndrome 03/11/2013  . Port catheter in place 12/31/2012  . Unspecified constipation 10/01/2012  . Anemia 10/01/2012  . Rectal bleeding 10/01/2012  . Elevated alkaline phosphatase level 10/01/2012  . MS (multiple sclerosis) (HCC) 09/02/2012  . DVT (deep venous thrombosis) (HCC) 09/02/2012  . ANEMIA 04/12/2010  . NAUSEA WITH VOMITING 04/12/2010  . OTHER DYSPHAGIA 04/12/2010   Lurena Nida, PTA/CLT [phone]  Lurena Nida 08/06/2019, 12:31 PM  [facility] [address] [place], [place], [address] Phone: [phone]   Fax:  [phone]  Name: Melinda Moore MRN: [id number] Date of Birth: 02/04/1971

## 2019-08-11 ENCOUNTER — Other Ambulatory Visit: Payer: Self-pay

## 2019-08-11 ENCOUNTER — Ambulatory Visit (HOSPITAL_COMMUNITY): Payer: 59 | Attending: Orthopedic Surgery | Admitting: Physical Therapy

## 2019-08-11 ENCOUNTER — Encounter (HOSPITAL_COMMUNITY): Payer: Self-pay | Admitting: Physical Therapy

## 2019-08-11 DIAGNOSIS — M25561 Pain in right knee: Secondary | ICD-10-CM | POA: Insufficient documentation

## 2019-08-11 DIAGNOSIS — M25661 Stiffness of right knee, not elsewhere classified: Secondary | ICD-10-CM | POA: Diagnosis not present

## 2019-08-11 DIAGNOSIS — R2689 Other abnormalities of gait and mobility: Secondary | ICD-10-CM | POA: Diagnosis not present

## 2019-08-11 NOTE — Therapy (Signed)
[facility] [address] [place], [place], [address] Phone: [phone]   Fax:  [phone]  Physical Therapy Treatment  Patient Details  Name: Melinda Moore MRN: [id number] Date of Birth: [birth year] Referring Provider (PT): Teryl Lucy MD   Encounter Date: 08/11/2019  PT End of Session - 08/11/19 0950    Visit Number  5    Number of Visits  16    Date for PT Re-Evaluation  09/18/19   reassess 08/21/19   Authorization Type  [employer] (no auth/ 60 visit limit)    Authorization Time Period  07/23/19-09/18/19    Authorization - Visit Number  5    Authorization - Number of Visits  10    PT Start Time  [phone]    PT Stop Time  1028    PT Time Calculation (min)  42 min    Activity Tolerance  Patient tolerated treatment well    Behavior During Therapy  [facility] for tasks assessed/performed       Past Medical History:  Diagnosis Date  . Anemia   . Anxiety   . Bell's palsy   . Depression   . DVT (deep venous thrombosis) (HCC) 09/02/2012   [place] on 02/13/2012 showed acute DVT in left calf veins and posterior tibial veins  . Fibromyalgia   . GERD (gastroesophageal reflux disease)   . History of blood transfusion 2000  . History of trigeminal neuralgia   . HTN (hypertension)    patient denies was taking a blood pressure medication in early 2000 but it was migraines  . Leukopenia   . MS (multiple sclerosis) (HCC) 1997  . Neuropathic pain   . Peripheral edema   . Port catheter in place 12/31/2012  . Right bundle branch block     Past Surgical History:  Procedure Laterality Date  . ABDOMINAL HYSTERECTOMY    . CHOLECYSTECTOMY    . ESOPHAGOGASTRODUODENOSCOPY  2011   Dr. Jena Gauss. Possible cervical esophageal with status post disruption with passage of Maloney dilator, glycol esophageal erythema, antral erosions. Biopsy from the stomach revealed minimal chronic inactive inflammation but negative for H. pylori  . LAPAROSCOPIC GASTRIC SLEEVE  RESECTION N/A 04/17/2016   Procedure: LAPAROSCOPIC GASTRIC SLEEVE RESECTION WITH UPPER ENDOSCOPY;  Surgeon: Glenna Fellows, MD;  Location: [facility];  Service: General;  Laterality: N/A;  . PATELLA-FEMORAL ARTHROPLASTY Right 07/14/2019   Procedure: PATELLA-FEMORAL ARTHROPLASTY;  Surgeon: Teryl Lucy, MD;  Location: [facility];  Service: Orthopedics;  Laterality: Right;  . PORTACATH PLACEMENT    . TUBAL LIGATION  2001    There were no vitals filed for this visit.  Subjective Assessment - 08/11/19 0950    Subjective  Patient reports no new issues, slight stiffness in knees this morning, no pain. Exercises are going well.    Currently in Pain?  No/denies                       [facility] Adult PT Treatment/Exercise - 08/11/19 0001      Knee/Hip Exercises: Stretches   Active Hamstring Stretch  Right;3 reps;30 seconds    Active Hamstring Stretch Limitations  on 12" step    Knee: Self-Stretch to increase Flexion  Right;5 reps;10 seconds    Knee: Self-Stretch Limitations  on 12 inch box    Gastroc Stretch  Both;3 reps;30 seconds    Gastroc Stretch Limitations  on slant board       Knee/Hip Exercises: Aerobic   Recumbent Bike  4 min warmup on lv 3, seat 15      Knee/Hip Exercises: Standing   Heel Raises  Both;20 reps    Hip Abduction  Both;2 sets;10 reps    Abduction Limitations  RTB    Hip Extension  Both;2 sets;10 reps    Extension Limitations  RTB    Gait Training  450 feet with no AD, cues for heel strike and upright posture.    Other Standing Knee Exercises  tandem stance, solid floor, 3 x 20" intermittent HHA      Knee/Hip Exercises: Supine   Knee Extension  AROM;Right    Knee Extension Limitations  5    Knee Flexion  AROM;Right    Knee Flexion Limitations  111      Manual Therapy   Manual Therapy  Soft tissue mobilization;Passive ROM    Manual therapy comments  all manual interventions performed independently of other interventions    Soft tissue mobilization  STM  to R distal quad/hamstrings    Passive ROM  Rt knee extension PROM              PT Education - 08/11/19 1033    Education Details  on adding ankle weight to heel props at home    Person(s) Educated  Patient    Methods  Explanation    Comprehension  Verbalized understanding       PT Short Term Goals - 07/23/19 1904      PT SHORT TERM GOAL #1   Title  Patient will be independent with initial HEP to improve functional outcomes    Time  4    Period  Weeks    Status  New    Target Date  08/21/19      PT SHORT TERM GOAL #2   Title  Patient will improve FOTO score to <40% to indicate improvement in functional outcomes    Time  4    Period  Weeks    Status  New    Target Date  08/21/19        PT Long Term Goals - 07/23/19 1906      PT LONG TERM GOAL #1   Title  Patient will improve FOTO score to <35% to indicate improvement in functional outcomes    Time  8    Period  Weeks    Status  New    Target Date  09/18/19      PT LONG TERM GOAL #2   Title  Patient will be able to maintain tandem stance >30 seconds on BLEs to improve stability and reduce risk for falls    Time  8    Period  Weeks    Status  New    Target Date  09/18/19      PT LONG TERM GOAL #3   Title  Patient will have RT knee AROM 0-120 degrees to improve functional mobility and facilitate squatting to pick up items from floor.    Time  8    Period  Weeks    Status  New    Target Date  09/18/19      PT LONG TERM GOAL #4   Title  Patient will have equal to or > 4+/5 MMT throughout RLE to improve ability to perform functional mobility, stair ambulation and ADLs.    Time  8    Period  Weeks    Status  New    Target Date  09/18/19  Plan - 08/11/19 1034    Clinical Impression Statement  Patient tolerated session well today. Continues to be limited by glute weakness, and gait deficits, due in part to ongoing RT knee AROM restriction. Added hip abduction and extension with band for hip  strength, and manual knee PROM extension to assist knee AROM. Patient educated on purpose and function of all added exercise. Patent encouraged to add 5lb ankle weight to heel props at home for improve knee extension.    Examination-Activity Limitations  Bathing;Lift;Stand;Locomotion Level;Bend;Transfers;Carry;Dressing;Squat;Stairs;Sleep    Examination-Participation Restrictions  Yard Work;Community Activity    Stability/Clinical Decision Making  Stable/Uncomplicated    Rehab Potential  Good    PT Frequency  2x / week    PT Duration  8 weeks    PT Treatment/Interventions  ADLs/Self Care Home Management;Therapeutic exercise;Therapeutic activities;Patient/family education;Orthotic Fit/Training;Taping;Joint Manipulations;Splinting;Energy conservation;Neuromuscular re-education;Balance training;DME Instruction;Gait training;Stair training;Functional mobility training;Manual techniques;Compression bandaging    PT Next Visit Plan  Progress RT knee strength and ROM as tolerated. Focus on knee extension, gait and balance.    PT Home Exercise Plan  07/23/19: quad set, heel slides, ankle pump: 12/16:  LAQ, heel raises, LAQ, active hamstring stretch and heel prop for extension; 2/24 calf stretch, prone quad stretch    Consulted and Agree with Plan of Care  Patient       Patient will benefit from skilled therapeutic intervention in order to improve the following deficits and impairments:  Abnormal gait, Pain, Improper body mechanics, Decreased mobility, Decreased activity tolerance, Decreased endurance, Decreased range of motion, Decreased strength, Hypomobility, Decreased balance, Difficulty walking, Increased edema, Impaired flexibility  Visit Diagnosis: Right knee pain, unspecified chronicity  Stiffness of right knee, not elsewhere classified  Other abnormalities of gait and mobility     Problem List Patient Active Problem List   Diagnosis Date Noted  . Patellofemoral arthritis of right knee  07/14/2019  . History of DVT in adulthood 06/23/2019  . Right leg pain 03/12/2017  . Gait abnormality 03/12/2017  . Radiculopathy due to lumbar intervertebral disc disorder 09/21/2016  . Spondylosis without myelopathy or radiculopathy, lumbar region 09/21/2016  . History of anxiety 09/08/2016  . Primary osteoarthritis of right knee 08/30/2016  . Primary insomnia 08/30/2016  . Myofascial pain 08/30/2016  . Sciatica, right side 12/12/2015  . Right knee pain 12/12/2015  . Morbid obesity ([place]) 08/12/2015  . Lumbago 02/10/2014  . Chronic anxiety 07/08/2013  . Swelling of breast 04/08/2013  . Chronic pain syndrome 03/11/2013  . Port catheter in place 12/31/2012  . Unspecified constipation 10/01/2012  . Anemia 10/01/2012  . Rectal bleeding 10/01/2012  . Elevated alkaline phosphatase level 10/01/2012  . MS (multiple sclerosis) ([place]) 09/02/2012  . DVT (deep venous thrombosis) ([place]) 09/02/2012  . ANEMIA 04/12/2010  . NAUSEA WITH VOMITING 04/12/2010  . OTHER DYSPHAGIA 04/12/2010   10:36 AM, 08/11/19 Josue Hector PT DPT  Physical Therapist with [facility]  [phone] [place], [place], [address] Phone: [phone]   Fax:  [phone]  Name: Melinda Moore MRN: [id number] Date of Birth: May 12, 1971

## 2019-08-13 ENCOUNTER — Encounter (HOSPITAL_COMMUNITY): Payer: Self-pay | Admitting: Physical Therapy

## 2019-08-13 ENCOUNTER — Ambulatory Visit (HOSPITAL_COMMUNITY): Payer: 59 | Admitting: Physical Therapy

## 2019-08-13 ENCOUNTER — Other Ambulatory Visit: Payer: Self-pay

## 2019-08-13 DIAGNOSIS — R2689 Other abnormalities of gait and mobility: Secondary | ICD-10-CM

## 2019-08-13 DIAGNOSIS — M25661 Stiffness of right knee, not elsewhere classified: Secondary | ICD-10-CM | POA: Diagnosis not present

## 2019-08-13 DIAGNOSIS — M25561 Pain in right knee: Secondary | ICD-10-CM

## 2019-08-13 NOTE — Therapy (Signed)
[facility] [address] [place], [place], [address] Phone: [phone]   Fax:  [phone]  Physical Therapy Treatment  Patient Details  Name: Melinda Moore MRN: [id number] Date of Birth: 1970-11-09 Referring Provider (PT): Teryl Lucy MD   Encounter Date: 08/13/2019  PT End of Session - 08/13/19 1221    Visit Number  6    Number of Visits  16    Date for PT Re-Evaluation  09/18/19   reassess 08/21/19   Authorization Type  [employer] (no auth/ 60 visit limit)    Authorization Time Period  07/23/19-09/18/19    Authorization - Visit Number  6    Authorization - Number of Visits  10    PT Start Time  1133    PT Stop Time  1215    PT Time Calculation (min)  42 min    Activity Tolerance  Patient tolerated treatment well    Behavior During Therapy  [facility] for tasks assessed/performed       Past Medical History:  Diagnosis Date  . Anemia   . Anxiety   . Bell's palsy   . Depression   . DVT (deep venous thrombosis) (HCC) 09/02/2012   [place] on 02/13/2012 showed acute DVT in left calf veins and posterior tibial veins  . Fibromyalgia   . GERD (gastroesophageal reflux disease)   . History of blood transfusion 2000  . History of trigeminal neuralgia   . HTN (hypertension)    patient denies was taking a blood pressure medication in early 2000 but it was migraines  . Leukopenia   . MS (multiple sclerosis) (HCC) 1997  . Neuropathic pain   . Peripheral edema   . Port catheter in place 12/31/2012  . Right bundle branch block     Past Surgical History:  Procedure Laterality Date  . ABDOMINAL HYSTERECTOMY    . CHOLECYSTECTOMY    . ESOPHAGOGASTRODUODENOSCOPY  2011   Dr. Jena Gauss. Possible cervical esophageal with status post disruption with passage of Maloney dilator, glycol esophageal erythema, antral erosions. Biopsy from the stomach revealed minimal chronic inactive inflammation but negative for H. pylori  . LAPAROSCOPIC GASTRIC SLEEVE  RESECTION N/A 04/17/2016   Procedure: LAPAROSCOPIC GASTRIC SLEEVE RESECTION WITH UPPER ENDOSCOPY;  Surgeon: Glenna Fellows, MD;  Location: [facility];  Service: General;  Laterality: N/A;  . PATELLA-FEMORAL ARTHROPLASTY Right 07/14/2019   Procedure: PATELLA-FEMORAL ARTHROPLASTY;  Surgeon: Teryl Lucy, MD;  [facility]: [facility];  Service: Orthopedics;  Laterality: Right;  . PORTACATH PLACEMENT    . TUBAL LIGATION  2001    There were no vitals filed for this visit.  Subjective Assessment - 08/13/19 1132    Subjective  Pt states that she did a lot of housecleaning yesterday and has more pain today.    Limitations  Sitting;Lifting;Standing;Walking;House hold activities    How long can you stand comfortably?  1-2 minutes    How long can you walk comfortably?  2-3 minutes    Patient Stated Goals  walk normal, stand flat footed, be pain free    Currently in Pain?  Yes    Pain Score  5     Pain Location  Knee    Pain Orientation  Right    Pain Descriptors / Indicators  Aching    Pain Type  Acute pain    Pain Onset  1 to 4 weeks ago    Aggravating Factors   ROM, weight bearing    Pain Relieving Factors  ice  Effect of Pain on Daily Activities  limits                       [facility] Adult PT Treatment/Exercise - 08/13/19 0001      Knee/Hip Exercises: Stretches   Knee: Self-Stretch to increase Flexion  Right;5 reps;10 seconds    Knee: Self-Stretch Limitations  on 12 inch box    Gastroc Stretch  Both;3 reps;30 seconds    Gastroc Stretch Limitations  on slant board       Knee/Hip Exercises: Aerobic   Recumbent Bike  4 min warmup on lv 3, seat 15      Knee/Hip Exercises: Standing   Heel Raises  10 reps    Knee Flexion  Right;10 reps    Terminal Knee Extension  AROM;Right;10 reps      Knee/Hip Exercises: Supine   Short Arc Quad Sets  Right;15 reps    Knee Extension  AROM;Right    Knee Extension Limitations  8    Knee Flexion  AROM;Right    Knee Flexion Limitations  122       Manual Therapy   Manual Therapy  Manual Lymphatic Drainage (MLD);Passive ROM    Manual therapy comments  all manual interventions performed independently of other interventions    Passive ROM  Rt knee extension PROM                PT Short Term Goals - 07/23/19 1904      PT SHORT TERM GOAL #1   Title  Patient will be independent with initial HEP to improve functional outcomes    Time  4    Period  Weeks    Status  New    Target Date  08/21/19      PT SHORT TERM GOAL #2   Title  Patient will improve FOTO score to <40% to indicate improvement in functional outcomes    Time  4    Period  Weeks    Status  New    Target Date  08/21/19        PT Long Term Goals - 07/23/19 1906      PT LONG TERM GOAL #1   Title  Patient will improve FOTO score to <35% to indicate improvement in functional outcomes    Time  8    Period  Weeks    Status  New    Target Date  09/18/19      PT LONG TERM GOAL #2   Title  Patient will be able to maintain tandem stance >30 seconds on BLEs to improve stability and reduce risk for falls    Time  8    Period  Weeks    Status  New    Target Date  09/18/19      PT LONG TERM GOAL #3   Title  Patient will have RT knee AROM 0-120 degrees to improve functional mobility and facilitate squatting to pick up items from floor.    Time  8    Period  Weeks    Status  New    Target Date  09/18/19      PT LONG TERM GOAL #4   Title  Patient will have equal to or > 4+/5 MMT throughout RLE to improve ability to perform functional mobility, stair ambulation and ADLs.    Time  8    Period  Weeks    Status  New    Target Date  09/18/19  Plan - 08/13/19 1222    Clinical Impression Statement  PT session focused on ROM with noted weakness of distal extension with quadricep mm. Pt still has some swelling which maybe prohibitive to knee extension therapist therefore perfomed decongestive manual techniques and self educated pt.  PT knee  flexion has improved significantly.    Examination-Activity Limitations  Bathing;Lift;Stand;Locomotion Level;Bend;Transfers;Carry;Dressing;Squat;Stairs;Sleep    Examination-Participation Restrictions  Yard Work;Community Activity    Stability/Clinical Decision Making  Stable/Uncomplicated    Rehab Potential  Good    PT Frequency  2x / week    PT Duration  8 weeks    PT Treatment/Interventions  ADLs/Self Care Home Management;Therapeutic exercise;Therapeutic activities;Patient/family education;Orthotic Fit/Training;Taping;Joint Manipulations;Splinting;Energy conservation;Neuromuscular re-education;Balance training;DME Instruction;Gait training;Stair training;Functional mobility training;Manual techniques;Compression bandaging    PT Next Visit Plan  Progress RT knee strength and ROM as tolerated. Focus on knee extension, gait and balance.    PT Home Exercise Plan  07/23/19: quad set, heel slides, ankle pump: 12/16:  LAQ, heel raises, LAQ, active hamstring stretch and heel prop for extension; 2/24 calf stretch, prone quad stretch3/4: self massage    Consulted and Agree with Plan of Care  Patient       Patient will benefit from skilled therapeutic intervention in order to improve the following deficits and impairments:  Abnormal gait, Pain, Improper body mechanics, Decreased mobility, Decreased activity tolerance, Decreased endurance, Decreased range of motion, Decreased strength, Hypomobility, Decreased balance, Difficulty walking, Increased edema, Impaired flexibility  Visit Diagnosis: Right knee pain, unspecified chronicity  Stiffness of right knee, not elsewhere classified  Other abnormalities of gait and mobility     Problem List Patient Active Problem List   Diagnosis Date Noted  . Patellofemoral arthritis of right knee 07/14/2019  . History of DVT in adulthood 06/23/2019  . Right leg pain 03/12/2017  . Gait abnormality 03/12/2017  . Radiculopathy due to lumbar intervertebral disc  disorder 09/21/2016  . Spondylosis without myelopathy or radiculopathy, lumbar region 09/21/2016  . History of anxiety 09/08/2016  . Primary osteoarthritis of right knee 08/30/2016  . Primary insomnia 08/30/2016  . Myofascial pain 08/30/2016  . Sciatica, right side 12/12/2015  . Right knee pain 12/12/2015  . Morbid obesity ([place]) 08/12/2015  . Lumbago 02/10/2014  . Chronic anxiety 07/08/2013  . Swelling of breast 04/08/2013  . Chronic pain syndrome 03/11/2013  . Port catheter in place 12/31/2012  . Unspecified constipation 10/01/2012  . Anemia 10/01/2012  . Rectal bleeding 10/01/2012  . Elevated alkaline phosphatase level 10/01/2012  . MS (multiple sclerosis) ([place]) 09/02/2012  . DVT (deep venous thrombosis) ([place]) 09/02/2012  . ANEMIA 04/12/2010  . NAUSEA WITH VOMITING 04/12/2010  . OTHER DYSPHAGIA 04/12/2010   Rayetta Humphrey, [place] CLT [phone] 08/13/2019, 12:25 PM  [place] [address] [place], [place], [address] Phone: [phone]   Fax:  [phone]  Name: RIKAYLA DEMMON MRN: [id number] Date of Birth: 08/14/1970

## 2019-08-18 ENCOUNTER — Ambulatory Visit (HOSPITAL_COMMUNITY): Payer: 59 | Admitting: Physical Therapy

## 2019-08-18 ENCOUNTER — Telehealth (HOSPITAL_COMMUNITY): Payer: Self-pay | Admitting: Physical Therapy

## 2019-08-18 NOTE — Telephone Encounter (Signed)
pt feeling a little under the weather.

## 2019-08-20 ENCOUNTER — Encounter (HOSPITAL_COMMUNITY): Payer: Self-pay

## 2019-08-20 ENCOUNTER — Other Ambulatory Visit: Payer: Self-pay

## 2019-08-20 ENCOUNTER — Ambulatory Visit (HOSPITAL_COMMUNITY): Payer: 59

## 2019-08-20 DIAGNOSIS — M25661 Stiffness of right knee, not elsewhere classified: Secondary | ICD-10-CM

## 2019-08-20 DIAGNOSIS — R2689 Other abnormalities of gait and mobility: Secondary | ICD-10-CM

## 2019-08-20 DIAGNOSIS — M25561 Pain in right knee: Secondary | ICD-10-CM | POA: Diagnosis not present

## 2019-08-20 NOTE — Therapy (Signed)
[place], [place], [address] Phone: [phone]   Fax:  [phone]  Physical Therapy Treatment  Patient Details  Name: Melinda Moore MRN: [id number] Date of Birth: 01-06-1971 Referring Provider (PT): Marchia Bond MD   Encounter Date: 08/20/2019  PT End of Session - 08/20/19 1005    Visit Number  7    Number of Visits  16    Date for PT Re-Evaluation  09/18/19   Reassess 08/21/19   Authorization Type  [employer] (no auth/ 60 visit limit)    Authorization Time Period  07/23/19-09/18/19    Authorization - Visit Number  7    Authorization - Number of Visits  10    PT Start Time  0957   2' on bike, not included with charges   PT Stop Time  1045    PT Time Calculation (min)  48 min    Activity Tolerance  Patient tolerated treatment well    Behavior During Therapy  [facility] for tasks assessed/performed       Past Medical History:  Diagnosis Date  . Anemia   . Anxiety   . Bell's palsy   . Depression   . DVT (deep venous thrombosis) ([place]) 09/02/2012   [place] on 02/13/2012 showed acute DVT in left calf veins and posterior tibial veins  . Fibromyalgia   . GERD (gastroesophageal reflux disease)   . History of blood transfusion 2000  . History of trigeminal neuralgia   . HTN (hypertension)    patient denies was taking a blood pressure medication in early 2000 but it was migraines  . Leukopenia   . MS (multiple sclerosis) ([place]) 1997  . Neuropathic pain   . Peripheral edema   . Port catheter in place 12/31/2012  . Right bundle branch block     Past Surgical History:  Procedure Laterality Date  . ABDOMINAL HYSTERECTOMY    . CHOLECYSTECTOMY    . ESOPHAGOGASTRODUODENOSCOPY  2011   Dr. Gala Romney. Possible cervical esophageal with status post disruption with passage of Maloney dilator, glycol esophageal erythema, antral erosions. Biopsy from the stomach revealed minimal chronic inactive inflammation but negative for H. pylori   . LAPAROSCOPIC GASTRIC SLEEVE RESECTION N/A 04/17/2016   Procedure: LAPAROSCOPIC GASTRIC SLEEVE RESECTION WITH UPPER ENDOSCOPY;  Surgeon: Excell Seltzer, MD;  Location: [facility];  Service: General;  Laterality: N/A;  . PATELLA-FEMORAL ARTHROPLASTY Right 07/14/2019   Procedure: PATELLA-FEMORAL ARTHROPLASTY;  Surgeon: Marchia Bond, MD;  Location: [facility];  Service: Orthopedics;  Laterality: Right;  . PORTACATH PLACEMENT    . TUBAL LIGATION  2001    There were no vitals filed for this visit.  Subjective Assessment - 08/20/19 1002    Subjective  Pt stated she feels stiff in her back and knee today.  Stated she got up earlier today to loosen up.  Feels her MS increases stiffness in back.    Patient Stated Goals  walk normal, stand flat footed, be pain free    Currently in Pain?  Yes    Pain Score  2     Pain Location  Knee    Pain Orientation  Right    Pain Descriptors / Indicators  Tightness;Aching    Pain Type  Acute pain    Pain Onset  1 to 4 weeks ago    Pain Frequency  Constant    Aggravating Factors   ROM, weight bearing    Pain Relieving Factors  ice  Effect of Pain on Daily Activities  limits                       [facility] Adult PT Treatment/Exercise - 08/20/19 0001      Knee/Hip Exercises: Stretches   Gastroc Stretch  Both;3 reps;30 seconds    Gastroc Stretch Limitations  on slant board       Knee/Hip Exercises: Aerobic   Recumbent Bike  2 min warm up seat 13      Knee/Hip Exercises: Standing   Heel Raises  10 reps    Heel Raises Limitations  toe raises on slant    Terminal Knee Extension  AROM;Right;10 reps    Theraband Level (Terminal Knee Extension)  Level 2 (Red)    Terminal Knee Extension Limitations  5" holds    SLS  Rt 10", Lt 3"      Knee/Hip Exercises: Supine   Quad Sets  Right;10 reps    Short Arc Quad Sets  Right;15 reps    Knee Extension  AROM;Right    Knee Extension Limitations  8    Knee Flexion  AROM;Right    Knee Flexion Limitations   123      Manual Therapy   Manual Therapy  Manual Lymphatic Drainage (MLD);Passive ROM    Manual therapy comments  all manual interventions performed independently of other interventions    Manual Lymphatic Drainage (MLD)  Manual decogntive technqiues for Rt LE edema                PT Short Term Goals - 07/23/19 1904      PT SHORT TERM GOAL #1   Title  Patient will be independent with initial HEP to improve functional outcomes    Time  4    Period  Weeks    Status  New    Target Date  08/21/19      PT SHORT TERM GOAL #2   Title  Patient will improve FOTO score to <40% to indicate improvement in functional outcomes    Time  4    Period  Weeks    Status  New    Target Date  08/21/19        PT Long Term Goals - 07/23/19 1906      PT LONG TERM GOAL #1   Title  Patient will improve FOTO score to <35% to indicate improvement in functional outcomes    Time  8    Period  Weeks    Status  New    Target Date  09/18/19      PT LONG TERM GOAL #2   Title  Patient will be able to maintain tandem stance >30 seconds on BLEs to improve stability and reduce risk for falls    Time  8    Period  Weeks    Status  New    Target Date  09/18/19      PT LONG TERM GOAL #3   Title  Patient will have RT knee AROM 0-120 degrees to improve functional mobility and facilitate squatting to pick up items from floor.    Time  8    Period  Weeks    Status  New    Target Date  09/18/19      PT LONG TERM GOAL #4   Title  Patient will have equal to or > 4+/5 MMT throughout RLE to improve ability to perform functional mobility, stair ambulation and ADLs.  Time  8    Period  Weeks    Status  New    Target Date  09/18/19            Plan - 08/20/19 1100    Clinical Impression Statement  Session focus with ROM primarly extension and LE strengthening.  Pt reports increased fatigue today with MS, assessed fatigue through session with range from 4-6/10 during standing exercises.  Pt  continues to have edema present proximal knee that limits knee extension, therapist performed decongestive manual techniques to improve ROM.  AROM 8-123 degrees.  Reviewed quad strengthening HEP exercises.    Examination-Activity Limitations  Bathing;Lift;Stand;Locomotion Level;Bend;Transfers;Carry;Dressing;Squat;Stairs;Sleep    Examination-Participation Restrictions  Yard Work;Community Activity    Stability/Clinical Decision Making  Stable/Uncomplicated    Clinical Decision Making  Low    PT Frequency  2x / week    PT Duration  8 weeks    PT Treatment/Interventions  ADLs/Self Care Home Management;Therapeutic exercise;Therapeutic activities;Patient/family education;Orthotic Fit/Training;Taping;Joint Manipulations;Splinting;Energy conservation;Neuromuscular re-education;Balance training;DME Instruction;Gait training;Stair training;Functional mobility training;Manual techniques;Compression bandaging    PT Next Visit Plan  Progress note next session.  Progress RT knee strength and ROM as tolerated. Focus on knee extension, gait and balance.    PT Home Exercise Plan  07/23/19: quad set, heel slides, ankle pump: 12/16:  LAQ, heel raises, LAQ, active hamstring stretch and heel prop for extension; 2/24 calf stretch, prone quad stretch3/4: self massage       Patient will benefit from skilled therapeutic intervention in order to improve the following deficits and impairments:  Abnormal gait, Pain, Improper body mechanics, Decreased mobility, Decreased activity tolerance, Decreased endurance, Decreased range of motion, Decreased strength, Hypomobility, Decreased balance, Difficulty walking, Increased edema, Impaired flexibility  Visit Diagnosis: Stiffness of right knee, not elsewhere classified  Other abnormalities of gait and mobility  Right knee pain, unspecified chronicity     Problem List Patient Active Problem List   Diagnosis Date Noted  . Patellofemoral arthritis of right knee 07/14/2019  .  History of DVT in adulthood 06/23/2019  . Right leg pain 03/12/2017  . Gait abnormality 03/12/2017  . Radiculopathy due to lumbar intervertebral disc disorder 09/21/2016  . Spondylosis without myelopathy or radiculopathy, lumbar region 09/21/2016  . History of anxiety 09/08/2016  . Primary osteoarthritis of right knee 08/30/2016  . Primary insomnia 08/30/2016  . Myofascial pain 08/30/2016  . Sciatica, right side 12/12/2015  . Right knee pain 12/12/2015  . Morbid obesity ([facility]) 08/12/2015  . Lumbago 02/10/2014  . Chronic anxiety 07/08/2013  . Swelling of breast 04/08/2013  . Chronic pain syndrome 03/11/2013  . Port catheter in place 12/31/2012  . Unspecified constipation 10/01/2012  . Anemia 10/01/2012  . Rectal bleeding 10/01/2012  . Elevated alkaline phosphatase level 10/01/2012  . MS (multiple sclerosis) ([facility]) 09/02/2012  . DVT (deep venous thrombosis) ([facility]) 09/02/2012  . ANEMIA 04/12/2010  . NAUSEA WITH VOMITING 04/12/2010  . OTHER DYSPHAGIA 04/12/2010   Becky Sax, LPTA/CLT; CBIS [phone]   Juel Burrow 08/20/2019, 11:07 AM  [facility] [address] [place], [place], [address] Phone: [phone]   Fax:  [phone]  Name: Melinda Moore MRN: [id number] Date of Birth: [birth year]

## 2019-08-25 ENCOUNTER — Ambulatory Visit (HOSPITAL_COMMUNITY): Payer: 59 | Admitting: Physical Therapy

## 2019-08-25 ENCOUNTER — Telehealth (HOSPITAL_COMMUNITY): Payer: Self-pay | Admitting: Physical Therapy

## 2019-08-25 NOTE — Telephone Encounter (Signed)
pt will not be here today due to cramping.

## 2019-08-26 ENCOUNTER — Encounter (HOSPITAL_COMMUNITY): Payer: Self-pay | Admitting: Physical Therapy

## 2019-08-26 ENCOUNTER — Ambulatory Visit (HOSPITAL_COMMUNITY): Payer: 59 | Admitting: Physical Therapy

## 2019-08-26 ENCOUNTER — Telehealth (HOSPITAL_COMMUNITY): Payer: Self-pay | Admitting: Physical Therapy

## 2019-08-26 ENCOUNTER — Other Ambulatory Visit: Payer: Self-pay

## 2019-08-26 DIAGNOSIS — M25561 Pain in right knee: Secondary | ICD-10-CM | POA: Diagnosis not present

## 2019-08-26 DIAGNOSIS — R2689 Other abnormalities of gait and mobility: Secondary | ICD-10-CM

## 2019-08-26 DIAGNOSIS — M25661 Stiffness of right knee, not elsewhere classified: Secondary | ICD-10-CM

## 2019-08-26 NOTE — Therapy (Signed)
[place], [place], [address] Phone: [phone]   Fax:  [phone]  Physical Therapy Treatment/ Progress Note  Patient Details  Name: Melinda Moore MRN: [id number] Date of Birth: 1971-06-04 Referring Provider (PT): Marchia Bond MD   Encounter Date: 08/26/2019   Progress Note Reporting Period 07/23/19 to 08/26/19  See note below for Objective Data and Assessment of Progress/Goals.       PT End of Session - 08/26/19 1311    Visit Number  8    Number of Visits  16    Date for PT Re-Evaluation  09/18/19   PN complete 08/26/19   Authorization Type  [employer] (no auth/ 60 visit limit)    Authorization Time Period  07/23/19-09/18/19    Authorization - Visit Number  7    Authorization - Number of Visits  10    PT Start Time  1301    PT Stop Time  1345    PT Time Calculation (min)  44 min    Activity Tolerance  Patient tolerated treatment well    Behavior During Therapy  W[facility] for tasks assessed/performed       Past Medical History:  Diagnosis Date  . Anemia   . Anxiety   . Bell's palsy   . Depression   . DVT (deep venous thrombosis) ([place]) 09/02/2012   [place] on 02/13/2012 showed acute DVT in left calf veins and posterior tibial veins  . Fibromyalgia   . GERD (gastroesophageal reflux disease)   . History of blood transfusion 2000  . History of trigeminal neuralgia   . HTN (hypertension)    patient denies was taking a blood pressure medication in early 2000 but it was migraines  . Leukopenia   . MS (multiple sclerosis) ([place]) 1997  . Neuropathic pain   . Peripheral edema   . Port catheter in place 12/31/2012  . Right bundle branch block     Past Surgical History:  Procedure Laterality Date  . ABDOMINAL HYSTERECTOMY    . CHOLECYSTECTOMY    . ESOPHAGOGASTRODUODENOSCOPY  2011   Dr. Gala Romney. Possible cervical esophageal with status post disruption with passage of Maloney dilator, glycol esophageal erythema,  antral erosions. Biopsy from the stomach revealed minimal chronic inactive inflammation but negative for H. pylori  . LAPAROSCOPIC GASTRIC SLEEVE RESECTION N/A 04/17/2016   Procedure: LAPAROSCOPIC GASTRIC SLEEVE RESECTION WITH UPPER ENDOSCOPY;  Surgeon: Excell Seltzer, MD;  Location[facility];  Service: General;  Laterality: N/A;  . PATELLA-FEMORAL ARTHROPLASTY Right 07/14/2019   Procedure: PATELLA-FEMORAL ARTHROPLASTY;  Surgeon: Marchia Bond, MD;  [facility];  Service: Orthopedics;  Laterality: Right;  . PORTACATH PLACEMENT    . TUBAL LIGATION  2001    There were no vitals filed for this visit.  Subjective Assessment - 08/26/19 1309    Subjective  Patient says things are going pretty good. Says her knee still hurts. Says she has been doing HEP at home. Says she has had some MS flare ups in the past week when she was very tense and could not do a lot. Patient reports says she feels about 70% improved since starting therapy.    Limitations  Sitting;Lifting;Standing;Walking;House hold activities    How long can you stand comfortably?  5-7 minutes    How long can you walk comfortably?  1 hour while shopping using cart    Patient Stated Goals  walk normal, stand flat footed, be pain free    Currently in  Pain?  Yes    Pain Score  8     Pain Location  Knee    Pain Orientation  Right;Anterior;Medial    Pain Descriptors / Indicators  Aching;Tightness    Pain Type  Acute pain    Pain Onset  1 to 4 weeks ago    Pain Frequency  Constant    Effect of Pain on Daily Activities  Limits         [facility] PT Assessment - 08/26/19 0001      Assessment   Medical Diagnosis  RT knee pain s/p RT patellofemoral arthroplasty    Referring Provider (PT)  Marchia Bond MD    Onset Date/Surgical Date  07/14/19    Prior Therapy  No      Precautions   Precautions  None      Restrictions   Weight Bearing Restrictions  No      Balance Screen   Has the patient fallen in the past 6 months  Yes    How  many times?  1   says she fell 1 x in January, before starting therapy      [place] residence    Living Arrangements  Spouse/significant other;Children      Prior Function   Level of Independence  Independent      Cognition   Overall Cognitive Status  Within Functional Limits for tasks assessed      Observation/Other Assessments   Focus on Therapeutic Outcomes (FOTO)   28% limited    was 46%      Sensation   Light Touch  Appears Intact      AROM   Right Knee Extension  7    Right Knee Flexion  123      Strength   Overall Strength Comments  hip extension tested in prone position as patient now able to tolerate, but limited by ROM     Right Hip Flexion  4+/5   4   Right Hip Extension  3-/5   Limited by ROM   Right Hip ABduction  4/5   was 4-   Left Hip Flexion  5/5    Left Hip Extension  3-/5   Limited by ROM    Left Hip ABduction  4+/5    Right Knee Flexion  4+/5   4-   Right Knee Extension  4+/5   was 3+   Left Knee Flexion  5/5    Left Knee Extension  5/5    Right Ankle Dorsiflexion  5/5    Left Ankle Dorsiflexion  5/5      Palpation   Palpation comment  Mod tenderness to palpation aboput RT distal adductors       Ambulation/Gait   Ambulation/Gait  Yes    Ambulation/Gait Assistance  7: Independent    Ambulation Distance (Feet)  370 Feet    Assistive device  None    Gait Pattern  Decreased stance time - right;Decreased step length - right;Decreased hip/knee flexion - right;Decreased stride length;Trunk flexed    Ambulation Surface  Level;Indoor    Gait Comments  2MWT      Static Standing Balance   Static Standing Balance -  Activities   Tandam Stance - Right Leg;Tandam Stance - Left Leg    Static Standing - Comment/# of Minutes  14 sec; 20 sec                   [facility]  PT Treatment/Exercise - 08/26/19 0001      Knee/Hip Exercises: Prone   Other Prone Exercises  POE stretch for hip flexor mobility,  and improving upright posture             PT Education - 08/26/19 1343    Education Details  On reassessment findings, progress to goals and HEP    Person(s) Educated  Patient    Methods  Explanation;Handout    Comprehension  Verbalized understanding       PT Short Term Goals - 08/26/19 1341      PT SHORT TERM GOAL #1   Title  Patient will be independent with initial HEP to improve functional outcomes    Baseline  Patient reports compliance with HEP    Time  4    Period  Weeks    Status  Achieved    Target Date  08/21/19      PT SHORT TERM GOAL #2   Title  Patient will improve FOTO score to <40% to indicate improvement in functional outcomes    Baseline  Current: 28%    Time  4    Period  Weeks    Status  Achieved    Target Date  08/21/19        PT Long Term Goals - 08/26/19 1341      PT LONG TERM GOAL #1   Title  Patient will improve FOTO score to <35% to indicate improvement in functional outcomes    Baseline  Current: 28%    Time  8    Period  Weeks    Status  Achieved      PT LONG TERM GOAL #2   Title  Patient will be able to maintain tandem stance >30 seconds on BLEs to improve stability and reduce risk for falls    Baseline  Current: 14 sec; 20 sec    Time  8    Period  Weeks    Status  On-going      PT LONG TERM GOAL #3   Title  Patient will have RT knee AROM 0-120 degrees to improve functional mobility and facilitate squatting to pick up items from floor.    Baseline  Current: RT knee AROm [id number]    Time  8    Period  Weeks    Status  Partially Met      PT LONG TERM GOAL #4   Title  Patient will have equal to or > 4+/5 MMT throughout RLE to improve ability to perform functional mobility, stair ambulation and ADLs.    Baseline  See MMT    Time  8    Period  Weeks    Status  Partially Met            Plan - 08/26/19 1505    Clinical Impression Statement  Patient demos good progress toward therapy goals. Patient currently with 2/2  short term goals and  long term goals met/ partially met. Patient continues to be limited by hip extension weakness, balance deficits and RT knee extension AROM limitations which continue to negatively impact function and are likely contributing to ongoing pain. Patient will continue to benefit from skilled therapy services to address remaining deficits to reduce pain and improve level of function with ADLs, functional mobility tasks and reduce risk for future falls.    Examination-Activity Limitations  Bathing;Lift;Stand;Locomotion Level;Bend;Transfers;Carry;Dressing;Squat;Stairs;Sleep    Examination-Participation Restrictions  Yard Work;Community Activity    Stability/Clinical Decision Making  Stable/Uncomplicated  PT Frequency  2x / week    PT Duration  8 weeks    PT Treatment/Interventions  ADLs/Self Care Home Management;Therapeutic exercise;Therapeutic activities;Patient/family education;Orthotic Fit/Training;Taping;Joint Manipulations;Splinting;Energy conservation;Neuromuscular re-education;Balance training;DME Instruction;Gait training;Stair training;Functional mobility training;Manual techniques;Compression bandaging    PT Next Visit Plan  Continue to progress as tolerated with focus on RT knee extension AROM, balance, hip flexor mobility and glute strengthening. Add manual SMT to medial knee musculature to address elevated pain and restriction.    PT Home Exercise Plan  07/23/19: quad set, heel slides, ankle pump: 12/16:  LAQ, heel raises, LAQ, active hamstring stretch and heel prop for extension; 2/24 calf stretch, prone quad stretch3/4: self massage; 08/26/19: POE    Consulted and Agree with Plan of Care  Patient       Patient will benefit from skilled therapeutic intervention in order to improve the following deficits and impairments:  Abnormal gait, Pain, Improper body mechanics, Decreased mobility, Decreased activity tolerance, Decreased endurance, Decreased range of motion, Decreased  strength, Hypomobility, Decreased balance, Difficulty walking, Increased edema, Impaired flexibility  Visit Diagnosis: Stiffness of right knee, not elsewhere classified  Other abnormalities of gait and mobility  Right knee pain, unspecified chronicity     Problem List Patient Active Problem List   Diagnosis Date Noted  . Patellofemoral arthritis of right knee 07/14/2019  . History of DVT in adulthood 06/23/2019  . Right leg pain 03/12/2017  . Gait abnormality 03/12/2017  . Radiculopathy due to lumbar intervertebral disc disorder 09/21/2016  . Spondylosis without myelopathy or radiculopathy, lumbar region 09/21/2016  . History of anxiety 09/08/2016  . Primary osteoarthritis of right knee 08/30/2016  . Primary insomnia 08/30/2016  . Myofascial pain 08/30/2016  . Sciatica, right side 12/12/2015  . Right knee pain 12/12/2015  . Morbid obesity ([place]) 08/12/2015  . Lumbago 02/10/2014  . Chronic anxiety 07/08/2013  . Swelling of breast 04/08/2013  . Chronic pain syndrome 03/11/2013  . Port catheter in place 12/31/2012  . Unspecified constipation 10/01/2012  . Anemia 10/01/2012  . Rectal bleeding 10/01/2012  . Elevated alkaline phosphatase level 10/01/2012  . MS (multiple sclerosis) ([place]) 09/02/2012  . DVT (deep venous thrombosis) ([place]) 09/02/2012  . ANEMIA 04/12/2010  . NAUSEA WITH VOMITING 04/12/2010  . OTHER DYSPHAGIA 04/12/2010   3:13 PM, 08/26/19 Josue Hector PT DPT  Physical Therapist with [facility]  [phone] [employer] [address] [place], [place], [address] Phone: [phone]   Fax:  [phone]  Name: Melinda Moore MRN: [id number] Date of Birth: [birth year]

## 2019-08-26 NOTE — Patient Instructions (Signed)
Access Code: [email or web address] URL: [email or web address] Date: 08/26/2019 Prepared by: Georges Lynch  Exercises Prone on Elbows Stretch - 2 x daily - 7 x weekly - 1 sets - 1 reps - 3-5 minutes hold

## 2019-08-26 NOTE — Telephone Encounter (Signed)
pt cancelled appt tomorrow due to upcoming waether event

## 2019-08-27 ENCOUNTER — Ambulatory Visit (HOSPITAL_COMMUNITY): Payer: 59 | Admitting: Physical Therapy

## 2019-08-28 ENCOUNTER — Other Ambulatory Visit: Payer: Self-pay | Admitting: Family Medicine

## 2019-08-30 NOTE — Telephone Encounter (Signed)
Please find out for the patient how often she is actually taking this on average on a regular basis thank you

## 2019-09-01 ENCOUNTER — Other Ambulatory Visit: Payer: Self-pay

## 2019-09-01 ENCOUNTER — Ambulatory Visit (HOSPITAL_COMMUNITY): Payer: 59 | Admitting: Physical Therapy

## 2019-09-01 DIAGNOSIS — R2689 Other abnormalities of gait and mobility: Secondary | ICD-10-CM

## 2019-09-01 DIAGNOSIS — M25661 Stiffness of right knee, not elsewhere classified: Secondary | ICD-10-CM

## 2019-09-01 DIAGNOSIS — M25561 Pain in right knee: Secondary | ICD-10-CM | POA: Diagnosis not present

## 2019-09-01 NOTE — Therapy (Signed)
[place], [place], [address] Phone: [phone]   Fax:  [phone]  Physical Therapy Treatment  Patient Details  Name: Melinda Moore MRN: [id number] Date of Birth: [birth year] Referring Provider (PT): Marchia Bond MD   Encounter Date: 09/01/2019  PT End of Session - 09/01/19 1356    Visit Number  9    Number of Visits  16    Date for PT Re-Evaluation  09/18/19   PN complete 08/26/19   Authorization Type  [employer] (no auth/ 60 visit limit)    Authorization Time Period  07/23/19-09/18/19    Authorization - Visit Number  8    Authorization - Number of Visits  10    PT Start Time  0830    PT Stop Time  0920    PT Time Calculation (min)  50 min    Activity Tolerance  Patient tolerated treatment well    Behavior During Therapy  [facility] for tasks assessed/performed       Past Medical History:  Diagnosis Date  . Anemia   . Anxiety   . Bell's palsy   . Depression   . DVT (deep venous thrombosis) ([place]) 09/02/2012   [place] on 02/13/2012 showed acute DVT in left calf veins and posterior tibial veins  . Fibromyalgia   . GERD (gastroesophageal reflux disease)   . History of blood transfusion 2000  . History of trigeminal neuralgia   . HTN (hypertension)    patient denies was taking a blood pressure medication in early 2000 but it was migraines  . Leukopenia   . MS (multiple sclerosis) ([place]) 1997  . Neuropathic pain   . Peripheral edema   . Port catheter in place 12/31/2012  . Right bundle branch block     Past Surgical History:  Procedure Laterality Date  . ABDOMINAL HYSTERECTOMY    . CHOLECYSTECTOMY    . ESOPHAGOGASTRODUODENOSCOPY  2011   Dr. Gala Romney. Possible cervical esophageal with status post disruption with passage of Maloney dilator, glycol esophageal erythema, antral erosions. Biopsy from the stomach revealed minimal chronic inactive inflammation but negative for H. pylori  . LAPAROSCOPIC GASTRIC SLEEVE  RESECTION N/A 04/17/2016   Procedure: LAPAROSCOPIC GASTRIC SLEEVE RESECTION WITH UPPER ENDOSCOPY;  Surgeon: Excell Seltzer, MD;  Location: [facility];  Service: General;  Laterality: N/A;  . PATELLA-FEMORAL ARTHROPLASTY Right 07/14/2019   Procedure: PATELLA-FEMORAL ARTHROPLASTY;  Surgeon: Marchia Bond, MD;  Location: [facility];  Service: Orthopedics;  Laterality: Right;  . PORTACATH PLACEMENT    . TUBAL LIGATION  2001    There were no vitals filed for this visit.  Subjective Assessment - 09/01/19 1354    Subjective  pt states her Rt knee is still swollen and is a little painful today.    Currently in Pain?  Yes    Pain Score  6     Pain Location  Knee    Pain Orientation  Right;Anterior;Medial    Pain Descriptors / Indicators  Aching;Tightness    Pain Type  Acute pain                       [facility] Adult PT Treatment/Exercise - 09/01/19 1343      Knee/Hip Exercises: Stretches   Gastroc Stretch  Both;3 reps;30 seconds    Gastroc Stretch Limitations  on slant board       Knee/Hip Exercises: Aerobic   Recumbent Bike  2 min warm up seat 13  Knee/Hip Exercises: Standing   Heel Raises  10 reps    Heel Raises Limitations  toe raises on slant 15X    Hip Extension  Both;2 sets;10 reps    Extension Limitations  squeeze and isolate mm    SLS with Vectors  5X5" each LE with 1 HHA    Other Standing Knee Exercises  tandem stance, solid floor, 3 x 20" intermittent HHA      Manual Therapy   Manual Therapy  Edema management;Other (comment)    Manual therapy comments  all manual interventions performed independently of other interventions    Edema Management  Rt knee and thigh to reduce edema    Soft tissue mobilization  --    Other Manual Therapy  scar massage instructions to reduce scar tissue.               PT Short Term Goals - 08/26/19 1341      PT SHORT TERM GOAL #1   Title  Patient will be independent with initial HEP to improve functional outcomes     Baseline  Patient reports compliance with HEP    Time  4    Period  Weeks    Status  Achieved    Target Date  08/21/19      PT SHORT TERM GOAL #2   Title  Patient will improve FOTO score to <40% to indicate improvement in functional outcomes    Baseline  Current: 28%    Time  4    Period  Weeks    Status  Achieved    Target Date  08/21/19        PT Long Term Goals - 08/26/19 1341      PT LONG TERM GOAL #1   Title  Patient will improve FOTO score to <35% to indicate improvement in functional outcomes    Baseline  Current: 28%    Time  8    Period  Weeks    Status  Achieved      PT LONG TERM GOAL #2   Title  Patient will be able to maintain tandem stance >30 seconds on BLEs to improve stability and reduce risk for falls    Baseline  Current: 14 sec; 20 sec    Time  8    Period  Weeks    Status  On-going      PT LONG TERM GOAL #3   Title  Patient will have RT knee AROM 0-120 degrees to improve functional mobility and facilitate squatting to pick up items from floor.    Baseline  Current: RT knee AROm [id number]    Time  8    Period  Weeks    Status  Partially Met      PT LONG TERM GOAL #4   Title  Patient will have equal to or > 4+/5 MMT throughout RLE to improve ability to perform functional mobility, stair ambulation and ADLs.    Baseline  See MMT    Time  8    Period  Weeks    Status  Partially Met            Plan - 09/01/19 1414    Clinical Impression Statement  continued with LE strengthening with addition of vector stance to work on improving stability/increase stance time.  Completed manual at EOS including retro massage to reduce edema and also scar massage and education on completing this independently.  Suggested thigh high garments as another way of edema   management if pt willing to purchase.    Examination-Activity Limitations  Bathing;Lift;Stand;Locomotion Level;Bend;Transfers;Carry;Dressing;Squat;Stairs;Sleep    Examination-Participation Restrictions   Yard Work;Community Activity    Stability/Clinical Decision Making  Stable/Uncomplicated    PT Frequency  2x / week    PT Duration  8 weeks    PT Treatment/Interventions  ADLs/Self Care Home Management;Therapeutic exercise;Therapeutic activities;Patient/family education;Orthotic Fit/Training;Taping;Joint Manipulations;Splinting;Energy conservation;Neuromuscular re-education;Balance training;DME Instruction;Gait training;Stair training;Functional mobility training;Manual techniques;Compression bandaging    PT Next Visit Plan  Continue to progress as tolerated with focus on RT knee extension AROM, balance, hip flexor mobility and glute strengthening. Add manual SMT to medial knee musculature to address elevated pain and restriction.    PT Home Exercise Plan  07/23/19: quad set, heel slides, ankle pump: 12/16:  LAQ, heel raises, LAQ, active hamstring stretch and heel prop for extension; 2/24 calf stretch, prone quad stretch3/4: self massage; 08/26/19: POE    Consulted and Agree with Plan of Care  Patient       Patient will benefit from skilled therapeutic intervention in order to improve the following deficits and impairments:  Abnormal gait, Pain, Improper body mechanics, Decreased mobility, Decreased activity tolerance, Decreased endurance, Decreased range of motion, Decreased strength, Hypomobility, Decreased balance, Difficulty walking, Increased edema, Impaired flexibility  Visit Diagnosis: Stiffness of right knee, not elsewhere classified  Right knee pain, unspecified chronicity  Other abnormalities of gait and mobility     Problem List Patient Active Problem List   Diagnosis Date Noted  . Patellofemoral arthritis of right knee 07/14/2019  . History of DVT in adulthood 06/23/2019  . Right leg pain 03/12/2017  . Gait abnormality 03/12/2017  . Radiculopathy due to lumbar intervertebral disc disorder 09/21/2016  . Spondylosis without myelopathy or radiculopathy, lumbar region 09/21/2016   . History of anxiety 09/08/2016  . Primary osteoarthritis of right knee 08/30/2016  . Primary insomnia 08/30/2016  . Myofascial pain 08/30/2016  . Sciatica, right side 12/12/2015  . Right knee pain 12/12/2015  . Morbid obesity ([place]) 08/12/2015  . Lumbago 02/10/2014  . Chronic anxiety 07/08/2013  . Swelling of breast 04/08/2013  . Chronic pain syndrome 03/11/2013  . Port catheter in place 12/31/2012  . Unspecified constipation 10/01/2012  . Anemia 10/01/2012  . Rectal bleeding 10/01/2012  . Elevated alkaline phosphatase level 10/01/2012  . MS (multiple sclerosis) ([place]) 09/02/2012  . DVT (deep venous thrombosis) ([place]) 09/02/2012  . ANEMIA 04/12/2010  . NAUSEA WITH VOMITING 04/12/2010  . OTHER DYSPHAGIA 04/12/2010   Teena Irani, PTA/CLT [phone]  Teena Irani 09/01/2019, 2:16 PM  [place] [address] [place], [place], [address] Phone: [phone]   Fax:  [phone]  Name: CARRINGTON MULLENAX MRN: [id number] Date of Birth: [birth year]

## 2019-09-02 ENCOUNTER — Telehealth (HOSPITAL_COMMUNITY): Payer: Self-pay | Admitting: Physical Therapy

## 2019-09-02 ENCOUNTER — Ambulatory Visit (HOSPITAL_COMMUNITY): Payer: 59 | Admitting: Physical Therapy

## 2019-09-02 NOTE — Telephone Encounter (Signed)
pt called to cx due to she has other obligations

## 2019-09-03 ENCOUNTER — Ambulatory Visit (HOSPITAL_COMMUNITY): Payer: 59 | Admitting: Physical Therapy

## 2019-09-04 NOTE — Telephone Encounter (Signed)
Left another message for pt to return call regarding refill request.

## 2019-09-07 NOTE — Telephone Encounter (Signed)
Please refused the medicine with the notation to the pharmacy to have the patient connect with [place]

## 2019-09-07 NOTE — Telephone Encounter (Signed)
Pt number did not have voicemail set up. Tried to get in touch with patient via husband Viviann Spare [facility]); voicemail is full. Would you like [place] to continue to call or refuse medication? Please advise. Thank you

## 2019-09-08 ENCOUNTER — Telehealth (HOSPITAL_COMMUNITY): Payer: Self-pay

## 2019-09-08 ENCOUNTER — Telehealth: Payer: Self-pay | Admitting: Family Medicine

## 2019-09-08 ENCOUNTER — Ambulatory Visit (HOSPITAL_COMMUNITY): Payer: 59

## 2019-09-08 MED ORDER — CARBAMAZEPINE ER 200 MG PO TB12: 200.0000 mg | ORAL_TABLET | Freq: Three times a day (TID) | ORAL | 1 refills | Status: DC | PRN

## 2019-09-08 NOTE — Telephone Encounter (Signed)
Medication sent to pharmacy and pt is aware 

## 2019-09-08 NOTE — Telephone Encounter (Signed)
May have medication renewed for 6 months

## 2019-09-08 NOTE — Telephone Encounter (Signed)
Have been trying to get in touch with patient for several days. Pt returned call and is taking at least one tablet per day. Please advise. Thank you

## 2019-09-08 NOTE — Telephone Encounter (Signed)
pt called to cancel today's appt due to work

## 2019-09-08 NOTE — Telephone Encounter (Signed)
Pt is calling in regards to her refill on carbamazepine (TEGRETOL XR) 200 MG 12 hr tablet.   She takes at least once a day.

## 2019-09-10 ENCOUNTER — Ambulatory Visit (HOSPITAL_COMMUNITY): Payer: 59 | Admitting: Physical Therapy

## 2019-09-10 ENCOUNTER — Telehealth (HOSPITAL_COMMUNITY): Payer: Self-pay | Admitting: Physical Therapy

## 2019-09-10 NOTE — Telephone Encounter (Signed)
pt cancelled appt for today, no reason given 

## 2019-09-15 ENCOUNTER — Ambulatory Visit (HOSPITAL_COMMUNITY): Payer: 59 | Admitting: Physical Therapy

## 2019-09-15 ENCOUNTER — Telehealth (HOSPITAL_COMMUNITY): Payer: Self-pay | Admitting: Physical Therapy

## 2019-09-15 NOTE — Telephone Encounter (Signed)
pt called to cx these appts due to her dtr is using her car for work.

## 2019-09-15 NOTE — Telephone Encounter (Signed)
pt called to cx these appts due to her dtr is using her car for work. 

## 2019-09-17 ENCOUNTER — Encounter (HOSPITAL_COMMUNITY): Payer: 59 | Admitting: Physical Therapy

## 2019-09-17 ENCOUNTER — Ambulatory Visit (HOSPITAL_COMMUNITY): Payer: 59 | Admitting: Physical Therapy

## 2019-09-17 ENCOUNTER — Telehealth (HOSPITAL_COMMUNITY): Payer: Self-pay | Admitting: Physical Therapy

## 2019-09-17 NOTE — Telephone Encounter (Signed)
pt called to cx due to she is cramping

## 2019-09-22 ENCOUNTER — Encounter (HOSPITAL_COMMUNITY): Payer: Self-pay | Admitting: Physical Therapy

## 2019-09-22 ENCOUNTER — Other Ambulatory Visit: Payer: Self-pay

## 2019-09-22 ENCOUNTER — Ambulatory Visit (HOSPITAL_COMMUNITY): Payer: 59 | Attending: Orthopedic Surgery | Admitting: Physical Therapy

## 2019-09-22 DIAGNOSIS — M25661 Stiffness of right knee, not elsewhere classified: Secondary | ICD-10-CM

## 2019-09-22 DIAGNOSIS — M25561 Pain in right knee: Secondary | ICD-10-CM | POA: Insufficient documentation

## 2019-09-22 DIAGNOSIS — R2689 Other abnormalities of gait and mobility: Secondary | ICD-10-CM | POA: Diagnosis present

## 2019-09-22 NOTE — Therapy (Signed)
[place], [place], [address] Phone: [phone]   Fax:  [phone]  Physical Therapy Treatment, Progress Note and RECERT  Patient Details  Name: Melinda Moore MRN: [id number] Date of Birth: 1970-07-12 Referring Provider (PT): Marchia Bond MD  Progress Note Reporting Period 08/26/19 to 09/22/19  See note below for Objective Data and Assessment of Progress/Goals.       Encounter Date: 09/22/2019  PT End of Session - 09/22/19 1130    Visit Number  10    Number of Visits  18    Date for PT Re-Evaluation  11/17/19   PN complete 08/26/19   Authorization Type  [employer] (no auth/ 60 visit limit)    Authorization Time Period  07/23/19-09/18/19; NEW POC dates 09/22/19 to 11/17/2019 with freq at 1-2x/week for total of 8 visits    Authorization - Visit Number  10    Authorization - Number of Visits  60    Progress Note Due on Visit  18    PT Start Time  1130    PT Stop Time  1210    PT Time Calculation (min)  40 min    Activity Tolerance  Patient tolerated treatment well    Behavior During Therapy  WFL for tasks assessed/performed       Past Medical History:  Diagnosis Date  . Anemia   . Anxiety   . Bell's palsy   . Depression   . DVT (deep venous thrombosis) ([place]) 09/02/2012   [place] on 02/13/2012 showed acute DVT in left calf veins and posterior tibial veins  . Fibromyalgia   . GERD (gastroesophageal reflux disease)   . History of blood transfusion 2000  . History of trigeminal neuralgia   . HTN (hypertension)    patient denies was taking a blood pressure medication in early 2000 but it was migraines  . Leukopenia   . MS (multiple sclerosis) ([place]) 1997  . Neuropathic pain   . Peripheral edema   . Port catheter in place 12/31/2012  . Right bundle branch block     Past Surgical History:  Procedure Laterality Date  . ABDOMINAL HYSTERECTOMY    . CHOLECYSTECTOMY    . ESOPHAGOGASTRODUODENOSCOPY  2011   Dr.  Gala Romney. Possible cervical esophageal with status post disruption with passage of Maloney dilator, glycol esophageal erythema, antral erosions. Biopsy from the stomach revealed minimal chronic inactive inflammation but negative for H. pylori  . LAPAROSCOPIC GASTRIC SLEEVE RESECTION N/A 04/17/2016   Procedure: LAPAROSCOPIC GASTRIC SLEEVE RESECTION WITH UPPER ENDOSCOPY;  Surgeon: Excell Seltzer, MD;  [facility];  Service: General;  Laterality: N/A;  . PATELLA-FEMORAL ARTHROPLASTY Right 07/14/2019   Procedure: PATELLA-FEMORAL ARTHROPLASTY;  Surgeon: Marchia Bond, MD;  [facility];  Service: Orthopedics;  Laterality: Right;  . PORTACATH PLACEMENT    . TUBAL LIGATION  2001    There were no vitals filed for this visit.  Subjective Assessment - 09/22/19 1310    Subjective  Patient states she has been doing her exercises at home. States she has been making a folder of the sheets of her exercises. States that she has had minor flare up in her MS symptoms and this always happens in the summer as she is so intolerant of the heat and this is why she missed her last few visits. States overall she feels about 85% better since the start of therapy. States she is now going up and down the steps, she is doing  yard work and doing things she wasn't doing prior to the surgery. State that the last 15% is she wants to be able to walk long distances and stand up long periods of time without fatigue/her knee giving out. Currently states she can stand about 10-15 minutes before it starts to give out. States that she can walk about 60 minutes at the grocery store. States overall her knee isn't bad in regards to pain. States at its worse it's a 6/10 on cloudy days.         [facility] PT Assessment - 09/22/19 0001      Assessment   Medical Diagnosis  RT knee pain s/p RT patellofemoral arthroplasty    Referring Provider (PT)  Marchia Bond MD    Onset Date/Surgical Date  07/14/19    Prior Therapy  No       Observation/Other Assessments   Focus on Therapeutic Outcomes (FOTO)   31% limited   was initially at 46% limited     AROM   Right Knee Extension  20   lacking    Right Knee Flexion  120    Left Knee Extension  10   lacking   Left Knee Flexion  124      Strength   Right Hip Flexion  4/5    Left Hip Flexion  4+/5    Right Knee Flexion  4/5    Right Knee Extension  4+/5    Left Knee Flexion  4+/5    Left Knee Extension  5/5    Right Ankle Dorsiflexion  5/5    Left Ankle Dorsiflexion  5/5      Ambulation/Gait   Ambulation/Gait  Yes    Ambulation/Gait Assistance  7: Independent    Ambulation Distance (Feet)  340 Feet    Assistive device  None    Gait Pattern  Decreased arm swing - right;Decreased arm swing - left;Decreased hip/knee flexion - right;Decreased dorsiflexion - left;Decreased hip/knee flexion - left;Decreased dorsiflexion - right;Right flexed knee in stance;Left flexed knee in stance;Trunk flexed    Ambulation Surface  Level;Indoor    Gait Comments  2MW                           PT Education - 09/22/19 1310    Education Details  on current condition, FOTO results, plan and focus with PT moving forward. on walking program to be started on treadmill (secondary to heat outside and inability to go to mall/stores secondary to [place])    Person(s) Educated  Patient    Methods  Explanation    Comprehension  Verbalized understanding       PT Short Term Goals - 09/22/19 1311      PT SHORT TERM GOAL #1   Title  Patient will be independent with initial HEP to improve functional outcomes    Baseline  Patient reports compliance with HEP    Time  4    Period  Weeks    Status  Achieved    Target Date  08/21/19      PT SHORT TERM GOAL #2   Title  Patient will improve FOTO score to <40% to indicate improvement in functional outcomes    Baseline  Current: 31% limited    Time  4    Period  Weeks    Status  Achieved    Target Date  08/21/19         PT Long Term Goals -  09/22/19 1312      PT LONG TERM GOAL #1   Title  Patient will improve FOTO score to <35% to indicate improvement in functional outcomes    Baseline  Current: 31% limited    Time  8    Period  Weeks    Status  Achieved      PT LONG TERM GOAL #2   Title  Patient will be able to maintain tandem stance >30 seconds on BLEs to improve stability and reduce risk for falls    Baseline  Current: L posterior 12 seconds, R posterior 33 seconds    Time  8    Period  Weeks    Status  On-going      PT LONG TERM GOAL #3   Title  Patient will have RT knee AROM 0-120 degrees to improve functional mobility and facilitate squatting to pick up items from floor.    Baseline  Current: RT knee AROm 20-120degrees    Time  8    Period  Weeks    Status  Partially Met      PT LONG TERM GOAL #4   Title  Patient will have equal to or > 4+/5 MMT throughout RLE to improve ability to perform functional mobility, stair ambulation and ADLs.    Baseline  See MMT    Time  8    Period  Weeks    Status  Partially Met            Plan - 09/22/19 1350    Clinical Impression Statement  Patient present for a progress note on this date. she has recently had a short hiatus in therapy secondary to the weather changing (getting hotter) and her multiple sclerosis flaring up. Patient performing home program as much as possible and focused on straightening her knee as much as possible.  Patient has met 2 out of 2 STG and  1 out of 4 long term goals. Extending POC additional 8 visits over 8 week period (incase MS/heat flares her up and she has to reschedule visits). Continuing her goals to work towards improving functional strength and mobility and developing her home exercise program.    Examination-Activity Limitations  Bathing;Lift;Stand;Locomotion Level;Bend;Transfers;Carry;Dressing;Squat;Stairs;Sleep    Examination-Participation Restrictions  Yard Work;Community Activity    Stability/Clinical  Decision Making  Stable/Uncomplicated    PT Frequency  Other (comment)   1-2x/week for total of 8 visits over 8 week certification   PT Duration  8 weeks    PT Treatment/Interventions  ADLs/Self Care Home Management;Therapeutic exercise;Therapeutic activities;Patient/family education;Orthotic Fit/Training;Taping;Joint Manipulations;Splinting;Energy conservation;Neuromuscular re-education;Balance training;DME Instruction;Gait training;Stair training;Functional mobility training;Manual techniques;Compression bandaging    PT Next Visit Plan  work on standing strength and endurance as tolerated by patient. Continue to progress as tolerated with focus on RT knee extension AROM, balance, hip flexor mobility and glute strengthening. Add manual SMT to medial knee musculature to address elevated pain and restriction.    PT Home Exercise Plan  07/23/19: quad set, heel slides, ankle pump: 12/16:  LAQ, heel raises, LAQ, active hamstring stretch and heel prop for extension; 2/24 calf stretch, prone quad stretch3/4: self massage; 08/26/19: POE    Consulted and Agree with Plan of Care  Patient       Patient will benefit from skilled therapeutic intervention in order to improve the following deficits and impairments:  Abnormal gait, Pain, Improper body mechanics, Decreased mobility, Decreased activity tolerance, Decreased endurance, Decreased range of motion, Decreased strength, Hypomobility, Decreased balance, Difficulty walking, Increased  edema, Impaired flexibility  Visit Diagnosis: Stiffness of right knee, not elsewhere classified  Right knee pain, unspecified chronicity  Other abnormalities of gait and mobility     Problem List Patient Active Problem List   Diagnosis Date Noted  . Patellofemoral arthritis of right knee 07/14/2019  . History of DVT in adulthood 06/23/2019  . Right leg pain 03/12/2017  . Gait abnormality 03/12/2017  . Radiculopathy due to lumbar intervertebral disc disorder 09/21/2016   . Spondylosis without myelopathy or radiculopathy, lumbar region 09/21/2016  . History of anxiety 09/08/2016  . Primary osteoarthritis of right knee 08/30/2016  . Primary insomnia 08/30/2016  . Myofascial pain 08/30/2016  . Sciatica, right side 12/12/2015  . Right knee pain 12/12/2015  . Morbid obesity ([place]) 08/12/2015  . Lumbago 02/10/2014  . Chronic anxiety 07/08/2013  . Swelling of breast 04/08/2013  . Chronic pain syndrome 03/11/2013  . Port catheter in place 12/31/2012  . Unspecified constipation 10/01/2012  . Anemia 10/01/2012  . Rectal bleeding 10/01/2012  . Elevated alkaline phosphatase level 10/01/2012  . MS (multiple sclerosis) ([place]) 09/02/2012  . DVT (deep venous thrombosis) ([place]) 09/02/2012  . ANEMIA 04/12/2010  . NAUSEA WITH VOMITING 04/12/2010  . OTHER DYSPHAGIA 04/12/2010    1:53 PM, 09/22/19 Jerene Pitch, DPT Physical Therapy with [facility]  [phone] office  [place] [address] [place], [place], [address] Phone: [phone]   Fax:  [phone]  Name: Melinda Moore MRN: [id number] Date of Birth: December 04, 1970

## 2019-09-23 DIAGNOSIS — M5416 Radiculopathy, lumbar region: Secondary | ICD-10-CM | POA: Diagnosis not present

## 2019-09-23 DIAGNOSIS — M48062 Spinal stenosis, lumbar region with neurogenic claudication: Secondary | ICD-10-CM | POA: Diagnosis not present

## 2019-09-24 ENCOUNTER — Other Ambulatory Visit: Payer: Self-pay

## 2019-09-24 ENCOUNTER — Ambulatory Visit (HOSPITAL_COMMUNITY): Payer: 59

## 2019-09-24 ENCOUNTER — Encounter (HOSPITAL_COMMUNITY): Payer: Self-pay

## 2019-09-24 DIAGNOSIS — R2689 Other abnormalities of gait and mobility: Secondary | ICD-10-CM

## 2019-09-24 DIAGNOSIS — M25661 Stiffness of right knee, not elsewhere classified: Secondary | ICD-10-CM | POA: Diagnosis not present

## 2019-09-24 DIAGNOSIS — M25561 Pain in right knee: Secondary | ICD-10-CM

## 2019-09-24 NOTE — Patient Instructions (Signed)
Hamstring: Towel Stretch (Supine)    Lie on back. Loop towel around left foot, hip and knee at 90. Straighten knee and pull foot toward body.  Hold 30 seconds. Relax. Repeat 3 times. Do 1-2 times a day. Repeat with other leg.   Copyright  VHI. All rights reserved.   Functional Quadriceps: Sit to Stand    Sit on edge of chair, feet flat on floor. Stand upright, extending knees fully. Repeat 10 times per set. Do 1 sets per session. Do 1-2 sessions per day.  http://orth.exer.us/735   Copyright  VHI. All rights reserved.

## 2019-09-24 NOTE — Therapy (Signed)
[place], [place], [address] Phone: [phone]   Fax:  [phone]  Physical Therapy Treatment  Patient Details  Name: Melinda Moore MRN: [id number] Date of Birth: Aug 23, 1970 Referring Provider (PT): Marchia Bond MD   Encounter Date: 09/24/2019  PT End of Session - 09/24/19 1310    Visit Number  11    Number of Visits  18    Date for PT Re-Evaluation  11/17/19   Progress note complete 09/22/19   Authorization Type  [employer] (no auth/ 60 visit limit)    Authorization - Visit Number  11    Authorization - Number of Visits  60    Progress Note Due on Visit  18    PT Start Time  1304   2' on bike, not included wiht charges   PT Stop Time  1348    PT Time Calculation (min)  44 min    Activity Tolerance  Patient tolerated treatment well    Behavior During Therapy  [facility] for tasks assessed/performed       Past Medical History:  Diagnosis Date  . Anemia   . Anxiety   . Bell's palsy   . Depression   . DVT (deep venous thrombosis) ([place]) 09/02/2012   [place] on 02/13/2012 showed acute DVT in left calf veins and posterior tibial veins  . Fibromyalgia   . GERD (gastroesophageal reflux disease)   . History of blood transfusion 2000  . History of trigeminal neuralgia   . HTN (hypertension)    patient denies was taking a blood pressure medication in early 2000 but it was migraines  . Leukopenia   . MS (multiple sclerosis) ([place]) 1997  . Neuropathic pain   . Peripheral edema   . Port catheter in place 12/31/2012  . Right bundle branch block     Past Surgical History:  Procedure Laterality Date  . ABDOMINAL HYSTERECTOMY    . CHOLECYSTECTOMY    . ESOPHAGOGASTRODUODENOSCOPY  2011   Dr. Gala Romney. Possible cervical esophageal with status post disruption with passage of Maloney dilator, glycol esophageal erythema, antral erosions. Biopsy from the stomach revealed minimal chronic inactive inflammation but negative for H.  pylori  . LAPAROSCOPIC GASTRIC SLEEVE RESECTION N/A 04/17/2016   Procedure: LAPAROSCOPIC GASTRIC SLEEVE RESECTION WITH UPPER ENDOSCOPY;  Surgeon: Excell Seltzer, MD;  Location: [facility];  Service: General;  Laterality: N/A;  . PATELLA-FEMORAL ARTHROPLASTY Right 07/14/2019   Procedure: PATELLA-FEMORAL ARTHROPLASTY;  Surgeon: Marchia Bond, MD;  Location: [facility];  Service: Orthopedics;  Laterality: Right;  . PORTACATH PLACEMENT    . TUBAL LIGATION  2001    There were no vitals filed for this visit.  Subjective Assessment - 09/24/19 1309    Subjective  Pt stated she is feeling good today, no reports of pain currently.  States her overall leg muscles feel thigh with some cramping.  Reoprts good activity tolerance today, has been grocery shopping before coming to therapy today.  Fatigue level between 2-3 at entrance.    Patient Stated Goals  walk normal, stand flat footed, be pain free    Currently in Pain?  No/denies                       [facility] Adult PT Treatment/Exercise - 09/24/19 0001      Exercises   Exercises  Knee/Hip      Knee/Hip Exercises: Stretches   Active Hamstring Stretch  2 reps;30 seconds  Active Hamstring Stretch Limitations  supine with rope    Knee: Self-Stretch to increase Flexion  Right;5 reps;10 seconds    Knee: Self-Stretch Limitations  on 12 inch box    Gastroc Stretch  Both;3 reps;30 seconds    Gastroc Stretch Limitations  on slant board       Knee/Hip Exercises: Aerobic   Recumbent Bike  2 min warm up seat 13      Knee/Hip Exercises: Standing   Heel Raises  10 reps    Heel Raises Limitations  toe raises on slant 15X    Terminal Knee Extension  AROM;Right;10 reps    Theraband Level (Terminal Knee Extension)  Level 2 (Red)    Terminal Knee Extension Limitations  5" holds    Hip Abduction  Both;2 sets;10 reps    Abduction Limitations  RTB    Hip Extension  Both;2 sets;10 reps    Extension Limitations  squeeze and isolate mm    SLS with  Vectors  5X5" each LE with 1 HHA    Other Standing Knee Exercises  tandem stance, solid floor, 3 x 20" intermittent HHA      Knee/Hip Exercises: Supine   Short Arc Quad Sets  Right;15 reps      Manual Therapy   Manual Therapy  Other (comment);Edema management;Soft tissue mobilization    Manual therapy comments  all manual interventions performed independently of other interventions    Edema Management  Rt knee and thigh to reduce edema    Soft tissue mobilization  STM to Rt quad and medial knee               PT Short Term Goals - 09/22/19 1311      PT SHORT TERM GOAL #1   Title  Patient will be independent with initial HEP to improve functional outcomes    Baseline  Patient reports compliance with HEP    Time  4    Period  Weeks    Status  Achieved    Target Date  08/21/19      PT SHORT TERM GOAL #2   Title  Patient will improve FOTO score to <40% to indicate improvement in functional outcomes    Baseline  Current: 31% limited    Time  4    Period  Weeks    Status  Achieved    Target Date  08/21/19        PT Long Term Goals - 09/22/19 1312      PT LONG TERM GOAL #1   Title  Patient will improve FOTO score to <35% to indicate improvement in functional outcomes    Baseline  Current: 31% limited    Time  8    Period  Weeks    Status  Achieved      PT LONG TERM GOAL #2   Title  Patient will be able to maintain tandem stance >30 seconds on BLEs to improve stability and reduce risk for falls    Baseline  Current: L posterior 12 seconds, R posterior 33 seconds    Time  8    Period  Weeks    Status  On-going      PT LONG TERM GOAL #3   Title  Patient will have RT knee AROM 0-120 degrees to improve functional mobility and facilitate squatting to pick up items from floor.    Baseline  Current: RT knee AROm 20-120degrees    Time  8    Period  Weeks    Status  Partially Met      PT LONG TERM GOAL #4   Title  Patient will have equal to or > 4+/5 MMT throughout  RLE to improve ability to perform functional mobility, stair ambulation and ADLs.    Baseline  See MMT    Time  8    Period  Weeks    Status  Partially Met            Plan - 09/24/19 1554    Clinical Impression Statement  Session focus on functional strengthening and mobiltiiy.  Pt c/o tightness and cramping, added stretches this session to address wiht positive results.  Pt continues to be limited with knee extension, increased focus with knee extension with therex.   Encouraged to complete heel strike with gait to improve knee extension.  No reoprts of increased pain through session.  Fatigue level stayed between 2-3/10.    Examination-Activity Limitations  Bathing;Lift;Stand;Locomotion Level;Bend;Transfers;Carry;Dressing;Squat;Stairs;Sleep    Examination-Participation Restrictions  Yard Work;Community Activity    Stability/Clinical Decision Making  Stable/Uncomplicated    Clinical Decision Making  Low    Rehab Potential  Good    PT Frequency  --   1-2x/week for total of 8 visits over 8 week certification   PT Duration  8 weeks    PT Treatment/Interventions  ADLs/Self Care Home Management;Therapeutic exercise;Therapeutic activities;Patient/family education;Orthotic Fit/Training;Taping;Joint Manipulations;Splinting;Energy conservation;Neuromuscular re-education;Balance training;DME Instruction;Gait training;Stair training;Functional mobility training;Manual techniques;Compression bandaging    PT Next Visit Plan  work on standing strength and endurance as tolerated by patient. Continue to progress as tolerated with focus on RT knee extension AROM, balance, hip flexor mobility and glute strengthening. Add manual SMT to medial knee musculature to address elevated pain and restriction.    PT Home Exercise Plan  07/23/19: quad set, heel slides, ankle pump: 12/16:  LAQ, heel raises, LAQ, active hamstring stretch and heel prop for extension; 2/24 calf stretch, prone quad stretch3/4: self massage;  08/26/19: POE; 09/24/19: supine hamstring stretch and STS.       Patient will benefit from skilled therapeutic intervention in order to improve the following deficits and impairments:  Abnormal gait, Pain, Improper body mechanics, Decreased mobility, Decreased activity tolerance, Decreased endurance, Decreased range of motion, Decreased strength, Hypomobility, Decreased balance, Difficulty walking, Increased edema, Impaired flexibility  Visit Diagnosis: Right knee pain, unspecified chronicity  Other abnormalities of gait and mobility  Stiffness of right knee, not elsewhere classified     Problem List Patient Active Problem List   Diagnosis Date Noted  . Patellofemoral arthritis of right knee 07/14/2019  . History of DVT in adulthood 06/23/2019  . Right leg pain 03/12/2017  . Gait abnormality 03/12/2017  . Radiculopathy due to lumbar intervertebral disc disorder 09/21/2016  . Spondylosis without myelopathy or radiculopathy, lumbar region 09/21/2016  . History of anxiety 09/08/2016  . Primary osteoarthritis of right knee 08/30/2016  . Primary insomnia 08/30/2016  . Myofascial pain 08/30/2016  . Sciatica, right side 12/12/2015  . Right knee pain 12/12/2015  . Morbid obesity ([place]) 08/12/2015  . Lumbago 02/10/2014  . Chronic anxiety 07/08/2013  . Swelling of breast 04/08/2013  . Chronic pain syndrome 03/11/2013  . Port catheter in place 12/31/2012  . Unspecified constipation 10/01/2012  . Anemia 10/01/2012  . Rectal bleeding 10/01/2012  . Elevated alkaline phosphatase level 10/01/2012  . MS (multiple sclerosis) ([place]) 09/02/2012  . DVT (deep venous thrombosis) ([place]) 09/02/2012  . ANEMIA 04/12/2010  . NAUSEA WITH VOMITING 04/12/2010  . OTHER  DYSPHAGIA 04/12/2010   Ihor Austin, LPTA/CLT; CBIS [phone] Aldona Lento 09/24/2019, 3:58 PM  [place] [address] [place], [place], [address] Phone: [phone]   Fax:   [phone]  Name: RAYLINN KOSAR MRN: [id number] Date of Birth: [birth year]

## 2019-09-29 ENCOUNTER — Encounter (HOSPITAL_COMMUNITY): Payer: Self-pay | Admitting: Physical Therapy

## 2019-09-29 ENCOUNTER — Other Ambulatory Visit: Payer: Self-pay

## 2019-09-29 ENCOUNTER — Ambulatory Visit (HOSPITAL_COMMUNITY): Payer: 59 | Admitting: Physical Therapy

## 2019-09-29 DIAGNOSIS — M25561 Pain in right knee: Secondary | ICD-10-CM

## 2019-09-29 DIAGNOSIS — M25661 Stiffness of right knee, not elsewhere classified: Secondary | ICD-10-CM

## 2019-09-29 DIAGNOSIS — R2689 Other abnormalities of gait and mobility: Secondary | ICD-10-CM

## 2019-09-29 NOTE — Therapy (Signed)
[place], [place], [address] Phone: [phone]   Fax:  [phone]  Physical Therapy Treatment  Patient Details  Name: Melinda Moore MRN: [id number] Date of Birth: February 20, 1971 Referring Provider (PT): Marchia Bond MD   Encounter Date: 09/29/2019  PT End of Session - 09/29/19 1247    Visit Number  12    Number of Visits  18    Date for PT Re-Evaluation  11/17/19   Progress note complete 09/22/19   Authorization Type  [employer] (no auth/ 60 visit limit)    Authorization - Visit Number  12    Authorization - Number of Visits  60    Progress Note Due on Visit  18    PT Start Time  1129   Patient arrived late   PT Stop Time  1159    PT Time Calculation (min)  30 min    Activity Tolerance  Patient tolerated treatment well    Behavior During Therapy  [facility] for tasks assessed/performed       Past Medical History:  Diagnosis Date  . Anemia   . Anxiety   . Bell's palsy   . Depression   . DVT (deep venous thrombosis) ([place]) 09/02/2012   [place] on 02/13/2012 showed acute DVT in left calf veins and posterior tibial veins  . Fibromyalgia   . GERD (gastroesophageal reflux disease)   . History of blood transfusion 2000  . History of trigeminal neuralgia   . HTN (hypertension)    patient denies was taking a blood pressure medication in early 2000 but it was migraines  . Leukopenia   . MS (multiple sclerosis) ([place]) 1997  . Neuropathic pain   . Peripheral edema   . Port catheter in place 12/31/2012  . Right bundle branch block     Past Surgical History:  Procedure Laterality Date  . ABDOMINAL HYSTERECTOMY    . CHOLECYSTECTOMY    . ESOPHAGOGASTRODUODENOSCOPY  2011   Dr. Gala Romney. Possible cervical esophageal with status post disruption with passage of Maloney dilator, glycol esophageal erythema, antral erosions. Biopsy from the stomach revealed minimal chronic inactive inflammation but negative for H. pylori  .  LAPAROSCOPIC GASTRIC SLEEVE RESECTION N/A 04/17/2016   Procedure: LAPAROSCOPIC GASTRIC SLEEVE RESECTION WITH UPPER ENDOSCOPY;  Surgeon: Excell Seltzer, MD;  Location: [facility];  Service: General;  Laterality: N/A;  . PATELLA-FEMORAL ARTHROPLASTY Right 07/14/2019   Procedure: PATELLA-FEMORAL ARTHROPLASTY;  Surgeon: Marchia Bond, MD;  Location: [facility];  Service: Orthopedics;  Laterality: Right;  . PORTACATH PLACEMENT    . TUBAL LIGATION  2001    There were no vitals filed for this visit.  Subjective Assessment - 09/29/19 1129    Subjective  Patient reported that she is having pain in her knee which she rates at 2 currently.    Patient Stated Goals  walk normal, stand flat footed, be pain free    Currently in Pain?  Yes    Pain Score  2     Pain Location  Knee    Pain Orientation  Right    Pain Descriptors / Indicators  Aching    Pain Type  Acute pain                       [facility] Adult PT Treatment/Exercise - 09/29/19 0001      Knee/Hip Exercises: Stretches   Active Hamstring Stretch  30 seconds;3 reps    Active Hamstring Stretch  Limitations  supine with rope    Gastroc Stretch  Both;3 reps;30 seconds    Gastroc Stretch Limitations  on slant board       Knee/Hip Exercises: Standing   Heel Raises  10 reps    Heel Raises Limitations  Toe raises x10    Terminal Knee Extension  AROM;Right;10 reps    Theraband Level (Terminal Knee Extension)  Level 2 (Red)    Terminal Knee Extension Limitations  10'' holds      Knee/Hip Exercises: Supine   Short Arc Quad Sets  Right;15 reps    Short Arc Quad Sets Limitations  Knee on foam roller    Knee Extension  AROM;Right    Knee Extension Limitations  8      Manual Therapy   Manual Therapy  Edema management;Soft tissue mobilization    Manual therapy comments  all manual interventions performed independently of other interventions    Edema Management  Rt knee and thigh to reduce edema    Soft tissue mobilization  STM to Rt quad  and medial knee               PT Short Term Goals - 09/22/19 1311      PT SHORT TERM GOAL #1   Title  Patient will be independent with initial HEP to improve functional outcomes    Baseline  Patient reports compliance with HEP    Time  4    Period  Weeks    Status  Achieved    Target Date  08/21/19      PT SHORT TERM GOAL #2   Title  Patient will improve FOTO score to <40% to indicate improvement in functional outcomes    Baseline  Current: 31% limited    Time  4    Period  Weeks    Status  Achieved    Target Date  08/21/19        PT Long Term Goals - 09/22/19 1312      PT LONG TERM GOAL #1   Title  Patient will improve FOTO score to <35% to indicate improvement in functional outcomes    Baseline  Current: 31% limited    Time  8    Period  Weeks    Status  Achieved      PT LONG TERM GOAL #2   Title  Patient will be able to maintain tandem stance >30 seconds on BLEs to improve stability and reduce risk for falls    Baseline  Current: L posterior 12 seconds, R posterior 33 seconds    Time  8    Period  Weeks    Status  On-going      PT LONG TERM GOAL #3   Title  Patient will have RT knee AROM 0-120 degrees to improve functional mobility and facilitate squatting to pick up items from floor.    Baseline  Current: RT knee AROm 20-120degrees    Time  8    Period  Weeks    Status  Partially Met      PT LONG TERM GOAL #4   Title  Patient will have equal to or > 4+/5 MMT throughout RLE to improve ability to perform functional mobility, stair ambulation and ADLs.    Baseline  See MMT    Time  8    Period  Weeks    Status  Partially Met            Plan - 09/29/19 1253  Clinical Impression Statement  Patient arrived late to session this date, therefore session was limited due to this. Focused on improving knee extension ROM this session. Increased hold time with TKE to 10'' this session. Continued with manual therapy to promote muscle relaxation, decrease  edema and improve overall mobility. Patient's knee extension AROM is limited to 8 degrees this session and patient would benefit from continued focus on improving her knee extension ROM.    Examination-Activity Limitations  Bathing;Lift;Stand;Locomotion Level;Bend;Transfers;Carry;Dressing;Squat;Stairs;Sleep    Examination-Participation Restrictions  Yard Work;Community Activity    Stability/Clinical Decision Making  Stable/Uncomplicated    Rehab Potential  Good    PT Frequency  --   1-2x/week for total of 8 visits over 8 week certification   PT Duration  8 weeks    PT Treatment/Interventions  ADLs/Self Care Home Management;Therapeutic exercise;Therapeutic activities;Patient/family education;Orthotic Fit/Training;Taping;Joint Manipulations;Splinting;Energy conservation;Neuromuscular re-education;Balance training;DME Instruction;Gait training;Stair training;Functional mobility training;Manual techniques;Compression bandaging    PT Next Visit Plan  work on standing strength and endurance as tolerated by patient. Continue to progress as tolerated with focus on RT knee extension AROM, balance, hip flexor mobility and glute strengthening. Add manual SMT to medial knee musculature to address elevated pain and restriction.    PT Home Exercise Plan  07/23/19: quad set, heel slides, ankle pump: 12/16:  LAQ, heel raises, LAQ, active hamstring stretch and heel prop for extension; 2/24 calf stretch, prone quad stretch3/4: self massage; 08/26/19: POE; 09/24/19: supine hamstring stretch and STS.       Patient will benefit from skilled therapeutic intervention in order to improve the following deficits and impairments:  Abnormal gait, Pain, Improper body mechanics, Decreased mobility, Decreased activity tolerance, Decreased endurance, Decreased range of motion, Decreased strength, Hypomobility, Decreased balance, Difficulty walking, Increased edema, Impaired flexibility  Visit Diagnosis: Right knee pain, unspecified  chronicity  Other abnormalities of gait and mobility  Stiffness of right knee, not elsewhere classified     Problem List Patient Active Problem List   Diagnosis Date Noted  . Patellofemoral arthritis of right knee 07/14/2019  . History of DVT in adulthood 06/23/2019  . Right leg pain 03/12/2017  . Gait abnormality 03/12/2017  . Radiculopathy due to lumbar intervertebral disc disorder 09/21/2016  . Spondylosis without myelopathy or radiculopathy, lumbar region 09/21/2016  . History of anxiety 09/08/2016  . Primary osteoarthritis of right knee 08/30/2016  . Primary insomnia 08/30/2016  . Myofascial pain 08/30/2016  . Sciatica, right side 12/12/2015  . Right knee pain 12/12/2015  . Morbid obesity ([place]) 08/12/2015  . Lumbago 02/10/2014  . Chronic anxiety 07/08/2013  . Swelling of breast 04/08/2013  . Chronic pain syndrome 03/11/2013  . Port catheter in place 12/31/2012  . Unspecified constipation 10/01/2012  . Anemia 10/01/2012  . Rectal bleeding 10/01/2012  . Elevated alkaline phosphatase level 10/01/2012  . MS (multiple sclerosis) ([place]) 09/02/2012  . DVT (deep venous thrombosis) ([place]) 09/02/2012  . ANEMIA 04/12/2010  . NAUSEA WITH VOMITING 04/12/2010  . OTHER DYSPHAGIA 04/12/2010   Clarene Critchley PT, DPT 12:57 PM, 09/29/19 [place] [address] [place], [place], [address] Phone: [phone]   Fax:  [phone]  Name: Melinda Moore MRN: [id number] Date of Birth: February 12, 1971

## 2019-10-01 ENCOUNTER — Encounter (HOSPITAL_COMMUNITY): Payer: Self-pay | Admitting: Physical Therapy

## 2019-10-01 ENCOUNTER — Other Ambulatory Visit: Payer: Self-pay

## 2019-10-01 ENCOUNTER — Ambulatory Visit (HOSPITAL_COMMUNITY): Payer: 59 | Admitting: Physical Therapy

## 2019-10-01 DIAGNOSIS — M25561 Pain in right knee: Secondary | ICD-10-CM

## 2019-10-01 DIAGNOSIS — M25661 Stiffness of right knee, not elsewhere classified: Secondary | ICD-10-CM

## 2019-10-01 DIAGNOSIS — R2689 Other abnormalities of gait and mobility: Secondary | ICD-10-CM

## 2019-10-01 NOTE — Therapy (Signed)
[facility] [place], [place], [address] Phone: [phone]   Fax:  [phone]  Physical Therapy Treatment  Patient Details  Name: Melinda Moore MRN: [id number] Date of Birth: May 12, 1971 Referring Provider (PT): Marchia Bond MD   Encounter Date: 10/01/2019  PT End of Session - 10/01/19 1128    Visit Number  13    Number of Visits  18    Date for PT Re-Evaluation  11/17/19   Progress note complete 09/22/19   Authorization Type  [employer] (no auth/ 60 visit limit)    Authorization - Visit Number  13    Authorization - Number of Visits  60    Progress Note Due on Visit  18    PT Start Time  1129    PT Stop Time  1209    PT Time Calculation (min)  40 min    Activity Tolerance  Patient tolerated treatment well    Behavior During Therapy  [facility] for tasks assessed/performed       Past Medical History:  Diagnosis Date  . Anemia   . Anxiety   . Bell's palsy   . Depression   . DVT (deep venous thrombosis) ([place]) 09/02/2012   [place] on 02/13/2012 showed acute DVT in left calf veins and posterior tibial veins  . Fibromyalgia   . GERD (gastroesophageal reflux disease)   . History of blood transfusion 2000  . History of trigeminal neuralgia   . HTN (hypertension)    patient denies was taking a blood pressure medication in early 2000 but it was migraines  . Leukopenia   . MS (multiple sclerosis) ([place]) 1997  . Neuropathic pain   . Peripheral edema   . Port catheter in place 12/31/2012  . Right bundle branch block     Past Surgical History:  Procedure Laterality Date  . ABDOMINAL HYSTERECTOMY    . CHOLECYSTECTOMY    . ESOPHAGOGASTRODUODENOSCOPY  2011   Dr. Gala Romney. Possible cervical esophageal with status post disruption with passage of Maloney dilator, glycol esophageal erythema, antral erosions. Biopsy from the stomach revealed minimal chronic inactive inflammation but negative for H. pylori  . LAPAROSCOPIC GASTRIC SLEEVE  RESECTION N/A 04/17/2016   Procedure: LAPAROSCOPIC GASTRIC SLEEVE RESECTION WITH UPPER ENDOSCOPY;  Surgeon: Excell Seltzer, MD;  Location: [facility];  Service: General;  Laterality: N/A;  . PATELLA-FEMORAL ARTHROPLASTY Right 07/14/2019   Procedure: PATELLA-FEMORAL ARTHROPLASTY;  Surgeon: Marchia Bond, MD;  Location: [facility];  Service: Orthopedics;  Laterality: Right;  . PORTACATH PLACEMENT    . TUBAL LIGATION  2001    There were no vitals filed for this visit.  Subjective Assessment - 10/01/19 1131    Subjective  States her knee is hurting a little but she was doing some yard work yesterday.    Patient Stated Goals  walk normal, stand flat footed, be pain free    Currently in Pain?  Yes    Pain Score  4     Pain Location  Knee    Pain Orientation  Right    Pain Descriptors / Indicators  Aching         [facility] PT Assessment - 10/01/19 0001      Assessment   Medical Diagnosis  RT knee pain s/p RT patellofemoral arthroplasty    Referring Provider (PT)  Marchia Bond MD    Onset Date/Surgical Date  07/14/19  [facility] Adult PT Treatment/Exercise - 10/01/19 0001      Knee/Hip Exercises: Stretches   Gastroc Stretch  Both;3 reps;30 seconds    Gastroc Stretch Limitations  on slant board       Knee/Hip Exercises: Standing   Forward Step Up  Both;4 sets;5 reps;Hand Hold: 2;Step Height: 4"    Other Standing Knee Exercises  tandem walking on  line x6 laps on 15 ft line    Other Standing Knee Exercises  lateral stepping 4x5 on 15 foot line - attempted monster walks but couldn't perform well even with in sequence demonstration      Knee/Hip Exercises: Seated   Sit to Sand  5 reps;without UE support   4 sets              PT Short Term Goals - 09/22/19 1311      PT SHORT TERM GOAL #1   Title  Patient will be independent with initial HEP to improve functional outcomes    Baseline  Patient reports compliance with HEP    Time  4    Period  Weeks     Status  Achieved    Target Date  08/21/19      PT SHORT TERM GOAL #2   Title  Patient will improve FOTO score to <40% to indicate improvement in functional outcomes    Baseline  Current: 31% limited    Time  4    Period  Weeks    Status  Achieved    Target Date  08/21/19        PT Long Term Goals - 09/22/19 1312      PT LONG TERM GOAL #1   Title  Patient will improve FOTO score to <35% to indicate improvement in functional outcomes    Baseline  Current: 31% limited    Time  8    Period  Weeks    Status  Achieved      PT LONG TERM GOAL #2   Title  Patient will be able to maintain tandem stance >30 seconds on BLEs to improve stability and reduce risk for falls    Baseline  Current: L posterior 12 seconds, R posterior 33 seconds    Time  8    Period  Weeks    Status  On-going      PT LONG TERM GOAL #3   Title  Patient will have RT knee AROM 0-120 degrees to improve functional mobility and facilitate squatting to pick up items from floor.    Baseline  Current: RT knee AROm 20-120degrees    Time  8    Period  Weeks    Status  Partially Met      PT LONG TERM GOAL #4   Title  Patient will have equal to or > 4+/5 MMT throughout RLE to improve ability to perform functional mobility, stair ambulation and ADLs.    Baseline  See MMT    Time  8    Period  Weeks    Status  Partially Met            Plan - 10/01/19 1129    Clinical Impression Statement  Focused on continued development of LE strength and balance. Fatigue noted but tolerated this well with no increase in pain. Will continue to focus on developing home exercise program with focus on functional strength and balance.    Examination-Activity Limitations  Bathing;Lift;Stand;Locomotion Level;Bend;Transfers;Carry;Dressing;Squat;Stairs;Sleep    Examination-Participation Restrictions  [employer] Activity  Stability/Clinical Decision Making  Stable/Uncomplicated    Rehab Potential  Good    PT Frequency  --    1-2x/week for total of 8 visits over 8 week certification   PT Duration  8 weeks    PT Treatment/Interventions  ADLs/Self Care Home Management;Therapeutic exercise;Therapeutic activities;Patient/family education;Orthotic Fit/Training;Taping;Joint Manipulations;Splinting;Energy conservation;Neuromuscular re-education;Balance training;DME Instruction;Gait training;Stair training;Functional mobility training;Manual techniques;Compression bandaging    PT Next Visit Plan  work on standing strength and endurance as tolerated by patient. Continue to progress balance, hip flexor mobility and glute strengthening.    PT Home Exercise Plan  07/23/19: quad set, heel slides, ankle pump: 12/16:  LAQ, heel raises, LAQ, active hamstring stretch and heel prop for extension; 2/24 calf stretch, prone quad stretch3/4: self massage; 08/26/19: POE; 09/24/19: supine hamstring stretch and STS. 4/22 lateral stepping, forward heel taps, gastroc stretch       Patient will benefit from skilled therapeutic intervention in order to improve the following deficits and impairments:  Abnormal gait, Pain, Improper body mechanics, Decreased mobility, Decreased activity tolerance, Decreased endurance, Decreased range of motion, Decreased strength, Hypomobility, Decreased balance, Difficulty walking, Increased edema, Impaired flexibility  Visit Diagnosis: Right knee pain, unspecified chronicity  Other abnormalities of gait and mobility  Stiffness of right knee, not elsewhere classified     Problem List Patient Active Problem List   Diagnosis Date Noted  . Patellofemoral arthritis of right knee 07/14/2019  . History of DVT in adulthood 06/23/2019  . Right leg pain 03/12/2017  . Gait abnormality 03/12/2017  . Radiculopathy due to lumbar intervertebral disc disorder 09/21/2016  . Spondylosis without myelopathy or radiculopathy, lumbar region 09/21/2016  . History of anxiety 09/08/2016  . Primary osteoarthritis of right knee  08/30/2016  . Primary insomnia 08/30/2016  . Myofascial pain 08/30/2016  . Sciatica, right side 12/12/2015  . Right knee pain 12/12/2015  . Morbid obesity ([place]) 08/12/2015  . Lumbago 02/10/2014  . Chronic anxiety 07/08/2013  . Swelling of breast 04/08/2013  . Chronic pain syndrome 03/11/2013  . Port catheter in place 12/31/2012  . Unspecified constipation 10/01/2012  . Anemia 10/01/2012  . Rectal bleeding 10/01/2012  . Elevated alkaline phosphatase level 10/01/2012  . MS (multiple sclerosis) ([place]) 09/02/2012  . DVT (deep venous thrombosis) ([place]) 09/02/2012  . ANEMIA 04/12/2010  . NAUSEA WITH VOMITING 04/12/2010  . OTHER DYSPHAGIA 04/12/2010   12:26 PM, 10/01/19 Jerene Pitch, DPT Physical Therapy with [facility]  [phone] office  [place] [address] [place], [place], [address] Phone: [phone]   Fax:  [phone]  Name: Melinda Moore MRN: [id number] Date of Birth: January 08, 1971

## 2019-10-06 ENCOUNTER — Ambulatory Visit (HOSPITAL_COMMUNITY): Payer: 59 | Admitting: Physical Therapy

## 2019-10-06 ENCOUNTER — Other Ambulatory Visit: Payer: Self-pay

## 2019-10-06 DIAGNOSIS — R2689 Other abnormalities of gait and mobility: Secondary | ICD-10-CM

## 2019-10-06 DIAGNOSIS — M25561 Pain in right knee: Secondary | ICD-10-CM

## 2019-10-06 DIAGNOSIS — M25661 Stiffness of right knee, not elsewhere classified: Secondary | ICD-10-CM

## 2019-10-06 NOTE — Therapy (Signed)
[place], [place], [address] Phone: [phone]   Fax:  [phone]  Physical Therapy Treatment  Patient Details  Name: Melinda Moore MRN: [id number] Date of Birth: September 24, 1970 Referring Provider (PT): Marchia Bond MD   Encounter Date: 10/06/2019  PT End of Session - 10/06/19 1155    Visit Number  14    Number of Visits  18    Date for PT Re-Evaluation  11/17/19   Progress note complete 09/22/19   Authorization Type  [employer] (no auth/ 60 visit limit)    Authorization - Visit Number  14    Authorization - Number of Visits  60    Progress Note Due on Visit  18    PT Start Time  1135    PT Stop Time  1215    PT Time Calculation (min)  40 min    Activity Tolerance  Patient tolerated treatment well    Behavior During Therapy  [facility] for tasks assessed/performed       Past Medical History:  Diagnosis Date  . Anemia   . Anxiety   . Bell's palsy   . Depression   . DVT (deep venous thrombosis) ([place]) 09/02/2012   [place] on 02/13/2012 showed acute DVT in left calf veins and posterior tibial veins  . Fibromyalgia   . GERD (gastroesophageal reflux disease)   . History of blood transfusion 2000  . History of trigeminal neuralgia   . HTN (hypertension)    patient denies was taking a blood pressure medication in early 2000 but it was migraines  . Leukopenia   . MS (multiple sclerosis) ([place]) 1997  . Neuropathic pain   . Peripheral edema   . Port catheter in place 12/31/2012  . Right bundle branch block     Past Surgical History:  Procedure Laterality Date  . ABDOMINAL HYSTERECTOMY    . CHOLECYSTECTOMY    . ESOPHAGOGASTRODUODENOSCOPY  2011   Dr. Gala Romney. Possible cervical esophageal with status post disruption with passage of Maloney dilator, glycol esophageal erythema, antral erosions. Biopsy from the stomach revealed minimal chronic inactive inflammation but negative for H. pylori  . LAPAROSCOPIC GASTRIC SLEEVE  RESECTION N/A 04/17/2016   Procedure: LAPAROSCOPIC GASTRIC SLEEVE RESECTION WITH UPPER ENDOSCOPY;  Surgeon: Excell Seltzer, MD;  Location[facility];  Service: General;  Laterality: N/A;  . PATELLA-FEMORAL ARTHROPLASTY Right 07/14/2019   Procedure: PATELLA-FEMORAL ARTHROPLASTY;  Surgeon: Marchia Bond, MD;  Location[facility];  Service: Orthopedics;  Laterality: Right;  . PORTACATH PLACEMENT    . TUBAL LIGATION  2001    There were no vitals filed for this visit.  Subjective Assessment - 10/06/19 1138    Subjective  pt states no real pain just stiffness.    Currently in Pain?  No/denies                       [facility] Adult PT Treatment/Exercise - 10/06/19 0001      Knee/Hip Exercises: Stretches   Gastroc Stretch  Both;3 reps;30 seconds    Gastroc Stretch Limitations  on slant board       Knee/Hip Exercises: Standing   Heel Raises  15 reps    Forward Lunges  Both;15 reps    Forward Lunges Limitations  onto 4" step no UE's    Lateral Step Up  Both;15 reps;Hand Hold: 1;Step Height: 4"    Forward Step Up  Both;Step Height: 4";15 reps;Hand Hold: 1  Other Standing Knee Exercises  tandem stance 30" X2 each with intermittent HHA    Other Standing Knee Exercises  retro, tandem and lateral stepping 4RT on 15 foot line      Knee/Hip Exercises: Seated   Sit to Sand  without UE support;10 reps               PT Short Term Goals - 09/22/19 1311      PT SHORT TERM GOAL #1   Title  Patient will be independent with initial HEP to improve functional outcomes    Baseline  Patient reports compliance with HEP    Time  4    Period  Weeks    Status  Achieved    Target Date  08/21/19      PT SHORT TERM GOAL #2   Title  Patient will improve FOTO score to <40% to indicate improvement in functional outcomes    Baseline  Current: 31% limited    Time  4    Period  Weeks    Status  Achieved    Target Date  08/21/19        PT Long Term Goals - 09/22/19 1312      PT LONG  TERM GOAL #1   Title  Patient will improve FOTO score to <35% to indicate improvement in functional outcomes    Baseline  Current: 31% limited    Time  8    Period  Weeks    Status  Achieved      PT LONG TERM GOAL #2   Title  Patient will be able to maintain tandem stance >30 seconds on BLEs to improve stability and reduce risk for falls    Baseline  Current: L posterior 12 seconds, R posterior 33 seconds    Time  8    Period  Weeks    Status  On-going      PT LONG TERM GOAL #3   Title  Patient will have RT knee AROM 0-120 degrees to improve functional mobility and facilitate squatting to pick up items from floor.    Baseline  Current: RT knee AROm 20-120degrees    Time  8    Period  Weeks    Status  Partially Met      PT LONG TERM GOAL #4   Title  Patient will have equal to or > 4+/5 MMT throughout RLE to improve ability to perform functional mobility, stair ambulation and ADLs.    Baseline  See MMT    Time  8    Period  Weeks    Status  Partially Met            Plan - 10/06/19 1212    Clinical Impression Statement  continued with focus on improving LE strength and stability.  Progressed with lunges without UE's and encouraged upright posturing with all therex this session.   Worked on static tandem stance with ability to hold 12 seconds max with Rt leading and 30 seconds with Lt leading wihtout UE assist.  Pt required only 1 seated rest break during session today.    Examination-Activity Limitations  Bathing;Lift;Stand;Locomotion Level;Bend;Transfers;Carry;Dressing;Squat;Stairs;Sleep    Examination-Participation Restrictions  Yard Work;Community Activity    Stability/Clinical Decision Making  Stable/Uncomplicated    Rehab Potential  Good    PT Frequency  --   1-2x/week for total of 8 visits over 8 week certification   PT Duration  8 weeks    PT Treatment/Interventions  ADLs/Self Care Home  Management;Therapeutic exercise;Therapeutic activities;Patient/family  education;Orthotic Fit/Training;Taping;Joint Manipulations;Splinting;Energy conservation;Neuromuscular re-education;Balance training;DME Instruction;Gait training;Stair training;Functional mobility training;Manual techniques;Compression bandaging    PT Next Visit Plan  work on standing strength and endurance as tolerated by patient. Continue to progress balance, hip flexor mobility and glute strengthening.    PT Home Exercise Plan  07/23/19: quad set, heel slides, ankle pump: 12/16:  LAQ, heel raises, LAQ, active hamstring stretch and heel prop for extension; 2/24 calf stretch, prone quad stretch3/4: self massage; 08/26/19: POE; 09/24/19: supine hamstring stretch and STS. 4/22 lateral stepping, forward heel taps, gastroc stretch       Patient will benefit from skilled therapeutic intervention in order to improve the following deficits and impairments:  Abnormal gait, Pain, Improper body mechanics, Decreased mobility, Decreased activity tolerance, Decreased endurance, Decreased range of motion, Decreased strength, Hypomobility, Decreased balance, Difficulty walking, Increased edema, Impaired flexibility  Visit Diagnosis: Other abnormalities of gait and mobility  Stiffness of right knee, not elsewhere classified  Right knee pain, unspecified chronicity     Problem List Patient Active Problem List   Diagnosis Date Noted  . Patellofemoral arthritis of right knee 07/14/2019  . History of DVT in adulthood 06/23/2019  . Right leg pain 03/12/2017  . Gait abnormality 03/12/2017  . Radiculopathy due to lumbar intervertebral disc disorder 09/21/2016  . Spondylosis without myelopathy or radiculopathy, lumbar region 09/21/2016  . History of anxiety 09/08/2016  . Primary osteoarthritis of right knee 08/30/2016  . Primary insomnia 08/30/2016  . Myofascial pain 08/30/2016  . Sciatica, right side 12/12/2015  . Right knee pain 12/12/2015  . Morbid obesity ([place]) 08/12/2015  . Lumbago 02/10/2014  .  Chronic anxiety 07/08/2013  . Swelling of breast 04/08/2013  . Chronic pain syndrome 03/11/2013  . Port catheter in place 12/31/2012  . Unspecified constipation 10/01/2012  . Anemia 10/01/2012  . Rectal bleeding 10/01/2012  . Elevated alkaline phosphatase level 10/01/2012  . MS (multiple sclerosis) ([place]) 09/02/2012  . DVT (deep venous thrombosis) ([place]) 09/02/2012  . ANEMIA 04/12/2010  . NAUSEA WITH VOMITING 04/12/2010  . OTHER DYSPHAGIA 04/12/2010   Teena Irani, PTA/CLT [phone]  Teena Irani 10/06/2019, 12:16 PM  [place], [place], [address] Phone: [phone]   Fax:  [phone]  Name: Melinda Moore MRN: [id number] Date of Birth: Jan 17, 1971

## 2019-10-08 ENCOUNTER — Ambulatory Visit (HOSPITAL_COMMUNITY): Payer: 59 | Admitting: Physical Therapy

## 2019-10-09 ENCOUNTER — Other Ambulatory Visit: Payer: Self-pay

## 2019-10-09 ENCOUNTER — Telehealth: Payer: Self-pay | Admitting: Family Medicine

## 2019-10-09 ENCOUNTER — Telehealth (INDEPENDENT_AMBULATORY_CARE_PROVIDER_SITE_OTHER): Payer: Medicare Other | Admitting: Family Medicine

## 2019-10-09 DIAGNOSIS — D649 Anemia, unspecified: Secondary | ICD-10-CM

## 2019-10-09 DIAGNOSIS — E785 Hyperlipidemia, unspecified: Secondary | ICD-10-CM

## 2019-10-09 DIAGNOSIS — E876 Hypokalemia: Secondary | ICD-10-CM | POA: Diagnosis not present

## 2019-10-09 DIAGNOSIS — G35 Multiple sclerosis: Secondary | ICD-10-CM

## 2019-10-09 DIAGNOSIS — G894 Chronic pain syndrome: Secondary | ICD-10-CM | POA: Diagnosis not present

## 2019-10-09 MED ORDER — HYDROMORPHONE HCL 4 MG PO TABS: ORAL_TABLET | ORAL | 0 refills | Status: DC

## 2019-10-09 NOTE — Telephone Encounter (Signed)
Ms. jaquelyn, sakamoto are scheduled for a virtual visit with your provider today.    Just as we do with appointments in the office, we must obtain your consent to participate.  Your consent will be active for this visit and any virtual visit you may have with one of our providers in the next 365 days.    If you have a MyChart account, I can also send a copy of this consent to you electronically.  All virtual visits are billed to your insurance company just like a traditional visit in the office.  As this is a virtual visit, video technology does not allow for your provider to perform a traditional examination.  This may limit your provider's ability to fully assess your condition.  If your provider identifies any concerns that need to be evaluated in person or the need to arrange testing such as labs, EKG, etc, we will make arrangements to do so.    Although advances in technology are sophisticated, we cannot ensure that it will always work on either your end or our end.  If the connection with a video visit is poor, we may have to switch to a telephone visit.  With either a video or telephone visit, we are not always able to ensure that we have a secure connection.   I need to obtain your verbal consent now.   Are you willing to proceed with your visit today?   SINIA ANTOSH has provided verbal consent on 10/09/2019 for a virtual visit (video or telephone).   Marlowe Shores, LPN 01/18/1750  02:58 AM

## 2019-10-09 NOTE — Progress Notes (Signed)
Subjective:    Patient ID: Melinda Moore, female    DOB: [birth year], 49 y.o.   MRN: [id number]  HPI This patient was seen today for chronic pain This patient has been on pain medicine for MS as well as osteoarthritis in her knees she has had recent surgery she has more surgery and physical therapy that she will be going through this spring if she does well with this she hopes to come off pain medicine sometime this summer we did discuss proper tapering techniques and discussed signs of withdrawal currently the pain medicine does allow her to function well and she would like to continue The medication list was reviewed and updated.   -Compliance with medication: Hydromorphone 4 mg  - Number patient states they take daily: 3.5-4  -when was the last dose patient took? This morning   The patient was advised the importance of maintaining medication and not using illegal substances with these.  Here for refills and follow up  The patient was educated that we can provide 3 monthly scripts for their medication, it is their responsibility to follow the instructions.  Side effects or complications from medications: none  Patient is aware that pain medications are meant to minimize the severity of the pain to allow their pain levels to improve to allow for better function. They are aware of that pain medications cannot totally remove their pain.  Due for UDT ( at least once per year) :   Virtual Visit via Telephone Note  I connected with Melinda Moore on 10/09/19 at  1:10 PM EDT by telephone and verified that I am speaking with the correct person using two identifiers.  Location: Patient: home Provider: office   I discussed the limitations, risks, security and privacy concerns of performing an evaluation and management service by telephone and the availability of in person appointments. I also discussed with the patient that there may be a patient responsible charge related to this service. The  patient expressed understanding and agreed to proceed.   History of Present Illness:    Observations/Objective:   Assessment and Plan:   Follow Up Instructions:    I discussed the assessment and treatment plan with the patient. The patient was provided an opportunity to ask questions and all were answered. The patient agreed with the plan and demonstrated an understanding of the instructions.   The patient was advised to call back or seek an in-person evaluation if the symptoms worsen or if the condition fails to improve as anticipated.  I provided 20 minutes of non-face-to-face time during this encounter.                Review of Systems  Constitutional: Negative for activity change and appetite change.  HENT: Negative for congestion and rhinorrhea.   Respiratory: Negative for cough and shortness of breath.   Cardiovascular: Negative for chest pain and leg swelling.  Gastrointestinal: Negative for abdominal pain, nausea and vomiting.  Skin: Negative for color change.  Neurological: Negative for dizziness and weakness.  Psychiatric/Behavioral: Negative for agitation and confusion.       Objective:   Physical Exam  Today's visit was via telephone Physical exam was not possible for this visit       Assessment & Plan:  The patient was seen in followup for chronic pain. A review over at their current pain status was discussed. Drug registry was checked. Prescriptions were given.  Regular follow-up recommended. Discussion was held regarding the importance of  compliance with medication as well as pain medication contract.  Patient was informed that medication may cause drowsiness and should not be combined  with other medications/alcohol or street drugs. If the patient feels medication is causing altered alertness then do not drive or operate dangerous equipment.  The patient states she is motivated to start tapering her pain medicines once she improves over the  next 6 weeks.  We did discuss how tapering the pain medicine should be a half a tablet at a time every 5 to 7 days until she can gradually taper off.  If she is having any signs of withdrawal-which were covered with her-then she would need to connect with [place].  Patient is aware to maintain her medications in a safe place.  Patient was also made aware that we may need to reduce her pain medicine over to hydrocodone as she tapers She will let us know if she is having any issues Currently she is seen orthopedics for physical therapy for recent knee surgery  Patient does have underlying MS sees a specialist I have encouraged her to get Covid vaccine we did discuss rationale  Hyperlipidemia good HDL in the past recheck lipid profile Recent lab via hospital showed low potassium and low calcium recheck labs Low hemoglobin after knee surgery recheck CBC

## 2019-10-12 ENCOUNTER — Telehealth (HOSPITAL_COMMUNITY): Payer: Self-pay | Admitting: Physical Therapy

## 2019-10-12 ENCOUNTER — Ambulatory Visit (HOSPITAL_COMMUNITY): Payer: 59 | Admitting: Physical Therapy

## 2019-10-12 NOTE — Telephone Encounter (Signed)
pt cancelled appt for today, no reason given 

## 2019-10-13 ENCOUNTER — Ambulatory Visit (HOSPITAL_COMMUNITY): Payer: 59 | Admitting: Physical Therapy

## 2019-10-14 ENCOUNTER — Ambulatory Visit (HOSPITAL_COMMUNITY): Payer: 59 | Attending: Orthopedic Surgery | Admitting: Physical Therapy

## 2019-10-14 ENCOUNTER — Other Ambulatory Visit: Payer: Self-pay

## 2019-10-14 DIAGNOSIS — M25661 Stiffness of right knee, not elsewhere classified: Secondary | ICD-10-CM | POA: Insufficient documentation

## 2019-10-14 DIAGNOSIS — M25561 Pain in right knee: Secondary | ICD-10-CM | POA: Insufficient documentation

## 2019-10-14 DIAGNOSIS — R2689 Other abnormalities of gait and mobility: Secondary | ICD-10-CM | POA: Diagnosis present

## 2019-10-14 NOTE — Therapy (Signed)
[place], [place], [address] Phone: [phone]   Fax:  [phone]  Physical Therapy Treatment  Patient Details  Name: Melinda Moore MRN: [id number] Date of Birth: [birth year] Referring Provider (PT): Marchia Bond MD   Encounter Date: 10/14/2019  PT End of Session - 10/14/19 1139    Visit Number  15    Number of Visits  18    Date for PT Re-Evaluation  11/17/19   Progress note complete 09/22/19   Authorization Type  [employer] (no auth/ 60 visit limit)    Authorization - Visit Number  15    Authorization - Number of Visits  60    Progress Note Due on Visit  18    PT Start Time  1052    PT Stop Time  1130    PT Time Calculation (min)  38 min    Activity Tolerance  Patient tolerated treatment well    Behavior During Therapy  [facility] for tasks assessed/performed       Past Medical History:  Diagnosis Date  . Anemia   . Anxiety   . Bell's palsy   . Depression   . DVT (deep venous thrombosis) ([place]) 09/02/2012   [place] on 02/13/2012 showed acute DVT in left calf veins and posterior tibial veins  . Fibromyalgia   . GERD (gastroesophageal reflux disease)   . History of blood transfusion 2000  . History of trigeminal neuralgia   . HTN (hypertension)    patient denies was taking a blood pressure medication in early 2000 but it was migraines  . Leukopenia   . MS (multiple sclerosis) ([place]) 1997  . Neuropathic pain   . Peripheral edema   . Port catheter in place 12/31/2012  . Right bundle branch block     Past Surgical History:  Procedure Laterality Date  . ABDOMINAL HYSTERECTOMY    . CHOLECYSTECTOMY    . ESOPHAGOGASTRODUODENOSCOPY  2011   Dr. Gala Romney. Possible cervical esophageal with status post disruption with passage of Maloney dilator, glycol esophageal erythema, antral erosions. Biopsy from the stomach revealed minimal chronic inactive inflammation but negative for H. pylori  . LAPAROSCOPIC GASTRIC SLEEVE  RESECTION N/A 04/17/2016   Procedure: LAPAROSCOPIC GASTRIC SLEEVE RESECTION WITH UPPER ENDOSCOPY;  Surgeon: Excell Seltzer, MD;  Location: [facility];  Service: General;  Laterality: N/A;  . PATELLA-FEMORAL ARTHROPLASTY Right 07/14/2019   Procedure: PATELLA-FEMORAL ARTHROPLASTY;  Surgeon: Marchia Bond, MD;  Location: [facility];  Service: Orthopedics;  Laterality: Right;  . PORTACATH PLACEMENT    . TUBAL LIGATION  2001    There were no vitals filed for this visit.  Subjective Assessment - 10/14/19 1057    Subjective  Pt states she has stiffness today no pain.    Currently in Pain?  No/denies                       [facility] Adult PT Treatment/Exercise - 10/14/19 0001      Knee/Hip Exercises: Stretches   Gastroc Stretch  Both;3 reps;30 seconds    Gastroc Stretch Limitations  on slant board       Knee/Hip Exercises: Standing   Heel Raises  15 reps    Forward Lunges  Both;15 reps    Forward Lunges Limitations  onto 4" step no UE's    Lateral Step Up  Both;15 reps;Hand Hold: 1;Step Height: 4"    Forward Step Up  Both;Step Height: 4";15 reps;Hand Hold: 1  Other Standing Knee Exercises  tandem stance 30" X2 each with intermittent HHA    Other Standing Knee Exercises  retro, tandem and lateral stepping 4RT on 15 foot line      Knee/Hip Exercises: Seated   Sit to Sand  without UE support;10 reps               PT Short Term Goals - 09/22/19 1311      PT SHORT TERM GOAL #1   Title  Patient will be independent with initial HEP to improve functional outcomes    Baseline  Patient reports compliance with HEP    Time  4    Period  Weeks    Status  Achieved    Target Date  08/21/19      PT SHORT TERM GOAL #2   Title  Patient will improve FOTO score to <40% to indicate improvement in functional outcomes    Baseline  Current: 31% limited    Time  4    Period  Weeks    Status  Achieved    Target Date  08/21/19        PT Long Term Goals - 09/22/19 1312      PT  LONG TERM GOAL #1   Title  Patient will improve FOTO score to <35% to indicate improvement in functional outcomes    Baseline  Current: 31% limited    Time  8    Period  Weeks    Status  Achieved      PT LONG TERM GOAL #2   Title  Patient will be able to maintain tandem stance >30 seconds on BLEs to improve stability and reduce risk for falls    Baseline  Current: L posterior 12 seconds, R posterior 33 seconds    Time  8    Period  Weeks    Status  On-going      PT LONG TERM GOAL #3   Title  Patient will have RT knee AROM 0-120 degrees to improve functional mobility and facilitate squatting to pick up items from floor.    Baseline  Current: RT knee AROm 20-120degrees    Time  8    Period  Weeks    Status  Partially Met      PT LONG TERM GOAL #4   Title  Patient will have equal to or > 4+/5 MMT throughout RLE to improve ability to perform functional mobility, stair ambulation and ADLs.    Baseline  See MMT    Time  8    Period  Weeks    Status  Partially Met            Plan - 10/14/19 1141    Clinical Impression Statement  continued focus on functional strengthening and improving stability.  PT with noted improvement in balance today wtih abiltiy to maintain 30" in tandem with each LE leading.  No pain or issues with any exercises.  No rest breaks needed this session.  Overall progressing well.    Examination-Activity Limitations  Bathing;Lift;Stand;Locomotion Level;Bend;Transfers;Carry;Dressing;Squat;Stairs;Sleep    Examination-Participation Restrictions  Yard Work;Community Activity    Stability/Clinical Decision Making  Stable/Uncomplicated    Rehab Potential  Good    PT Frequency  --   1-2x/week for total of 8 visits over 8 week certification   PT Duration  8 weeks    PT Treatment/Interventions  ADLs/Self Care Home Management;Therapeutic exercise;Therapeutic activities;Patient/family education;Orthotic Fit/Training;Taping;Joint Manipulations;Splinting;Energy  conservation;Neuromuscular re-education;Balance training;DME Instruction;Gait training;Stair training;Functional mobility training;Manual  techniques;Compression bandaging    PT Next Visit Plan  work on standing strength and endurance as tolerated by patient. Continue to progress balance, hip flexor mobility and glute strengthening.  Next session add palloff press with RTB and in tandem    PT Home Exercise Plan  07/23/19: quad set, heel slides, ankle pump: 12/16:  LAQ, heel raises, LAQ, active hamstring stretch and heel prop for extension; 2/24 calf stretch, prone quad stretch3/4: self massage; 08/26/19: POE; 09/24/19: supine hamstring stretch and STS. 4/22 lateral stepping, forward heel taps, gastroc stretch       Patient will benefit from skilled therapeutic intervention in order to improve the following deficits and impairments:  Abnormal gait, Pain, Improper body mechanics, Decreased mobility, Decreased activity tolerance, Decreased endurance, Decreased range of motion, Decreased strength, Hypomobility, Decreased balance, Difficulty walking, Increased edema, Impaired flexibility  Visit Diagnosis: Other abnormalities of gait and mobility  Stiffness of right knee, not elsewhere classified  Right knee pain, unspecified chronicity     Problem List Patient Active Problem List   Diagnosis Date Noted  . Patellofemoral arthritis of right knee 07/14/2019  . History of DVT in adulthood 06/23/2019  . Right leg pain 03/12/2017  . Gait abnormality 03/12/2017  . Radiculopathy due to lumbar intervertebral disc disorder 09/21/2016  . Spondylosis without myelopathy or radiculopathy, lumbar region 09/21/2016  . History of anxiety 09/08/2016  . Primary osteoarthritis of right knee 08/30/2016  . Primary insomnia 08/30/2016  . Myofascial pain 08/30/2016  . Sciatica, right side 12/12/2015  . Right knee pain 12/12/2015  . Morbid obesity ([place]) 08/12/2015  . Lumbago 02/10/2014  . Chronic anxiety 07/08/2013   . Swelling of breast 04/08/2013  . Chronic pain syndrome 03/11/2013  . Port catheter in place 12/31/2012  . Unspecified constipation 10/01/2012  . Anemia 10/01/2012  . Rectal bleeding 10/01/2012  . Elevated alkaline phosphatase level 10/01/2012  . MS (multiple sclerosis) ([place]) 09/02/2012  . DVT (deep venous thrombosis) ([place]) 09/02/2012  . ANEMIA 04/12/2010  . NAUSEA WITH VOMITING 04/12/2010  . OTHER DYSPHAGIA 04/12/2010   Teena Irani, PTA/CLT [phone]  Teena Irani 10/14/2019, 11:43 AM  [place], [place], [address] Phone: [phone]   Fax:  [phone]  Name: Melinda Moore MRN: [id number] Date of Birth: [birth year]

## 2019-10-15 ENCOUNTER — Ambulatory Visit (HOSPITAL_COMMUNITY): Payer: 59

## 2019-10-16 ENCOUNTER — Ambulatory Visit: Payer: Medicare Other | Admitting: Family Medicine

## 2019-10-20 ENCOUNTER — Other Ambulatory Visit: Payer: Self-pay

## 2019-10-20 ENCOUNTER — Encounter (HOSPITAL_COMMUNITY): Payer: Self-pay | Admitting: Physical Therapy

## 2019-10-20 ENCOUNTER — Ambulatory Visit (HOSPITAL_COMMUNITY): Payer: 59 | Admitting: Physical Therapy

## 2019-10-20 DIAGNOSIS — M25561 Pain in right knee: Secondary | ICD-10-CM

## 2019-10-20 DIAGNOSIS — M25661 Stiffness of right knee, not elsewhere classified: Secondary | ICD-10-CM

## 2019-10-20 DIAGNOSIS — R2689 Other abnormalities of gait and mobility: Secondary | ICD-10-CM

## 2019-10-20 NOTE — Therapy (Signed)
[place], [place], [address] Phone: [phone]   Fax:  [phone]  Physical Therapy Treatment  Patient Details  Name: Melinda Moore MRN: [id number] Date of Birth: [birth year] Referring Provider (PT): Marchia Bond MD   Encounter Date: 10/20/2019  PT End of Session - 10/20/19 1135    Visit Number  16    Number of Visits  18    Date for PT Re-Evaluation  11/17/19   Progress note complete 09/22/19   Authorization Type  [employer] (no auth/ 60 visit limit)    Authorization - Visit Number  16    Authorization - Number of Visits  60    Progress Note Due on Visit  18    PT Start Time  1131    PT Stop Time  1211    PT Time Calculation (min)  40 min    Activity Tolerance  Patient tolerated treatment well    Behavior During Therapy  [facility] for tasks assessed/performed       Past Medical History:  Diagnosis Date  . Anemia   . Anxiety   . Bell's palsy   . Depression   . DVT (deep venous thrombosis) ([place]) 09/02/2012   [place] on 02/13/2012 showed acute DVT in left calf veins and posterior tibial veins  . Fibromyalgia   . GERD (gastroesophageal reflux disease)   . History of blood transfusion 2000  . History of trigeminal neuralgia   . HTN (hypertension)    patient denies was taking a blood pressure medication in early 2000 but it was migraines  . Leukopenia   . MS (multiple sclerosis) ([place]) 1997  . Neuropathic pain   . Peripheral edema   . Port catheter in place 12/31/2012  . Right bundle branch block     Past Surgical History:  Procedure Laterality Date  . ABDOMINAL HYSTERECTOMY    . CHOLECYSTECTOMY    . ESOPHAGOGASTRODUODENOSCOPY  2011   Dr. Gala Romney. Possible cervical esophageal with status post disruption with passage of Maloney dilator, glycol esophageal erythema, antral erosions. Biopsy from the stomach revealed minimal chronic inactive inflammation but negative for H. pylori  . LAPAROSCOPIC GASTRIC SLEEVE  RESECTION N/A 04/17/2016   Procedure: LAPAROSCOPIC GASTRIC SLEEVE RESECTION WITH UPPER ENDOSCOPY;  Surgeon: Excell Seltzer, MD;  Location: [facility];  Service: General;  Laterality: N/A;  . PATELLA-FEMORAL ARTHROPLASTY Right 07/14/2019   Procedure: PATELLA-FEMORAL ARTHROPLASTY;  Surgeon: Marchia Bond, MD;  Location: [facility];  Service: Orthopedics;  Laterality: Right;  . PORTACATH PLACEMENT    . TUBAL LIGATION  2001    There were no vitals filed for this visit.  Subjective Assessment - 10/20/19 1209    Subjective  Patient reports she feels good. States no pain. Reports her balance is still challenging but is coming a long.         [facility] PT Assessment - 10/20/19 0001      Assessment   Medical Diagnosis  RT knee pain s/p RT patellofemoral arthroplasty    Referring Provider (PT)  Marchia Bond MD    Onset Date/Surgical Date  07/14/19                   [facility] Adult PT Treatment/Exercise - 10/20/19 0001      Knee/Hip Exercises: Standing   Forward Step Up  Both;5 reps;Hand Hold: 0;4 sets   on black foam   Other Standing Knee Exercises  tandem stance in // bars - focus on keeping  hands up to decrease reliance on UE''s and improve hip/ankle strategies.     Other Standing Knee Exercises  narrow BOS on floor - x30" then with head turns x5 E then wiht head and arm turms x5 B the onblack foam repeated exercises 2x5 Each B               PT Short Term Goals - 09/22/19 1311      PT SHORT TERM GOAL #1   Title  Patient will be independent with initial HEP to improve functional outcomes    Baseline  Patient reports compliance with HEP    Time  4    Period  Weeks    Status  Achieved    Target Date  08/21/19      PT SHORT TERM GOAL #2   Title  Patient will improve FOTO score to <40% to indicate improvement in functional outcomes    Baseline  Current: 31% limited    Time  4    Period  Weeks    Status  Achieved    Target Date  08/21/19        PT Long Term Goals -  09/22/19 1312      PT LONG TERM GOAL #1   Title  Patient will improve FOTO score to <35% to indicate improvement in functional outcomes    Baseline  Current: 31% limited    Time  8    Period  Weeks    Status  Achieved      PT LONG TERM GOAL #2   Title  Patient will be able to maintain tandem stance >30 seconds on BLEs to improve stability and reduce risk for falls    Baseline  Current: L posterior 12 seconds, R posterior 33 seconds    Time  8    Period  Weeks    Status  On-going      PT LONG TERM GOAL #3   Title  Patient will have RT knee AROM 0-120 degrees to improve functional mobility and facilitate squatting to pick up items from floor.    Baseline  Current: RT knee AROm 20-120degrees    Time  8    Period  Weeks    Status  Partially Met      PT LONG TERM GOAL #4   Title  Patient will have equal to or > 4+/5 MMT throughout RLE to improve ability to perform functional mobility, stair ambulation and ADLs.    Baseline  See MMT    Time  8    Period  Weeks    Status  Partially Met            Plan - 10/20/19 1205    Clinical Impression Statement  Focused on balance exercises today. No pain noted but fatigue noted end of session. Reviewed HEP and answered all questions. Patient still having difficulties on foam surfaces. Anticipate patient to discharge next session pending patient presentation.    Examination-Activity Limitations  Bathing;Lift;Stand;Locomotion Level;Bend;Transfers;Carry;Dressing;Squat;Stairs;Sleep    Examination-Participation Restrictions  Yard Work;Community Activity    Stability/Clinical Decision Making  Stable/Uncomplicated    Rehab Potential  Good    PT Frequency  --   1-2x/week for total of 8 visits over 8 week certification   PT Duration  8 weeks    PT Treatment/Interventions  ADLs/Self Care Home Management;Therapeutic exercise;Therapeutic activities;Patient/family education;Orthotic Fit/Training;Taping;Joint Manipulations;Splinting;Energy  conservation;Neuromuscular re-education;Balance training;DME Instruction;Gait training;Stair training;Functional mobility training;Manual techniques;Compression bandaging    PT Next Visit Plan  anticipate   DC pending patient presentation, work on standing strength and endurance as tolerated by patient. Continue to progress balance, hip flexor mobility and glute strengthening.  Next session add palloff press with RTB and in tandem    PT Home Exercise Plan  07/23/19: quad set, heel slides, ankle pump: 12/16:  LAQ, heel raises, LAQ, active hamstring stretch and heel prop for extension; 2/24 calf stretch, prone quad stretch3/4: self massage; 08/26/19: POE; 09/24/19: supine hamstring stretch and STS. 4/22 lateral stepping, forward heel taps, gastroc stretch       Patient will benefit from skilled therapeutic intervention in order to improve the following deficits and impairments:  Abnormal gait, Pain, Improper body mechanics, Decreased mobility, Decreased activity tolerance, Decreased endurance, Decreased range of motion, Decreased strength, Hypomobility, Decreased balance, Difficulty walking, Increased edema, Impaired flexibility  Visit Diagnosis: Other abnormalities of gait and mobility  Stiffness of right knee, not elsewhere classified  Right knee pain, unspecified chronicity     Problem List Patient Active Problem List   Diagnosis Date Noted  . Patellofemoral arthritis of right knee 07/14/2019  . History of DVT in adulthood 06/23/2019  . Right leg pain 03/12/2017  . Gait abnormality 03/12/2017  . Radiculopathy due to lumbar intervertebral disc disorder 09/21/2016  . Spondylosis without myelopathy or radiculopathy, lumbar region 09/21/2016  . History of anxiety 09/08/2016  . Primary osteoarthritis of right knee 08/30/2016  . Primary insomnia 08/30/2016  . Myofascial pain 08/30/2016  . Sciatica, right side 12/12/2015  . Right knee pain 12/12/2015  . Morbid obesity ([facility]) 08/12/2015  .  Lumbago 02/10/2014  . Chronic anxiety 07/08/2013  . Swelling of breast 04/08/2013  . Chronic pain syndrome 03/11/2013  . Port catheter in place 12/31/2012  . Unspecified constipation 10/01/2012  . Anemia 10/01/2012  . Rectal bleeding 10/01/2012  . Elevated alkaline phosphatase level 10/01/2012  . MS (multiple sclerosis) ([facility]) 09/02/2012  . DVT (deep venous thrombosis) ([facility]) 09/02/2012  . ANEMIA 04/12/2010  . NAUSEA WITH VOMITING 04/12/2010  . OTHER DYSPHAGIA 04/12/2010   12:12 PM, 10/20/19 Anjannette Gauger, DPT Physical Therapy with [facility]  [phone] office  [place] [facility] [address] [place], [place], [address] Phone: [phone]   Fax:  [phone]  Name: Simara L Evelyn MRN: [id number] Date of Birth: 04/01/1971   

## 2019-10-21 ENCOUNTER — Encounter (HOSPITAL_COMMUNITY): Payer: 59

## 2019-10-22 ENCOUNTER — Ambulatory Visit (HOSPITAL_COMMUNITY): Payer: 59 | Admitting: Physical Therapy

## 2019-10-23 ENCOUNTER — Other Ambulatory Visit: Payer: Self-pay

## 2019-10-23 ENCOUNTER — Encounter (HOSPITAL_COMMUNITY): Payer: Self-pay | Admitting: Physical Therapy

## 2019-10-23 ENCOUNTER — Ambulatory Visit (HOSPITAL_COMMUNITY): Payer: 59 | Admitting: Physical Therapy

## 2019-10-23 DIAGNOSIS — R2689 Other abnormalities of gait and mobility: Secondary | ICD-10-CM | POA: Diagnosis not present

## 2019-10-23 DIAGNOSIS — M25661 Stiffness of right knee, not elsewhere classified: Secondary | ICD-10-CM

## 2019-10-23 DIAGNOSIS — M25561 Pain in right knee: Secondary | ICD-10-CM

## 2019-10-23 NOTE — Therapy (Signed)
[facility] [address] [place], [place], [address] Phone: [phone]   Fax:  [phone]  Physical Therapy Treatment / Discharge summary  Patient Details  Name: Melinda Moore MRN: [id number] Date of Birth: 01-Oct-1970 Referring Provider (PT): Marchia Bond MD   Encounter Date: 10/23/2019   Progress Note Reporting Period 09/22/19 to 10/23/19   See note below for Objective Data and Assessment of Progress/Goals.       PHYSICAL THERAPY DISCHARGE SUMMARY  Visits from Start of Care: 17  Current functional level related to goals / functional outcomes: See below   Remaining deficits: See below   Education / Equipment: Continuing HEP and attending local gym Plan: Patient agrees to discharge.  Patient goals were partially met. Patient is being discharged due to being pleased with the current functional level.  ?????       PT End of Session - 10/23/19 1101    Visit Number  17    Number of Visits  18    Date for PT Re-Evaluation  11/17/19   Progress note complete 09/22/19   Authorization Type  [employer] (no auth/ 60 visit limit)    Authorization - Visit Number  17    Authorization - Number of Visits  60    Progress Note Due on Visit  18    PT Start Time  1031    PT Stop Time  1055    PT Time Calculation (min)  24 min    Activity Tolerance  Patient tolerated treatment well    Behavior During Therapy  WFL for tasks assessed/performed       Past Medical History:  Diagnosis Date  . Anemia   . Anxiety   . Bell's palsy   . Depression   . DVT (deep venous thrombosis) ([place]) 09/02/2012   [place] on 02/13/2012 showed acute DVT in left calf veins and posterior tibial veins  . Fibromyalgia   . GERD (gastroesophageal reflux disease)   . History of blood transfusion 2000  . History of trigeminal neuralgia   . HTN (hypertension)    patient denies was taking a blood pressure medication in early 2000 but it was migraines  .  Leukopenia   . MS (multiple sclerosis) ([place]) 1997  . Neuropathic pain   . Peripheral edema   . Port catheter in place 12/31/2012  . Right bundle branch block     Past Surgical History:  Procedure Laterality Date  . ABDOMINAL HYSTERECTOMY    . CHOLECYSTECTOMY    . ESOPHAGOGASTRODUODENOSCOPY  2011   Dr. Gala Romney. Possible cervical esophageal with status post disruption with passage of Maloney dilator, glycol esophageal erythema, antral erosions. Biopsy from the stomach revealed minimal chronic inactive inflammation but negative for H. pylori  . LAPAROSCOPIC GASTRIC SLEEVE RESECTION N/A 04/17/2016   Procedure: LAPAROSCOPIC GASTRIC SLEEVE RESECTION WITH UPPER ENDOSCOPY;  Surgeon: Excell Seltzer, MD;  Location: [facility];  Service: General;  Laterality: N/A;  . PATELLA-FEMORAL ARTHROPLASTY Right 07/14/2019   Procedure: PATELLA-FEMORAL ARTHROPLASTY;  Surgeon: Marchia Bond, MD;  Location: [facility];  Service: Orthopedics;  Laterality: Right;  . PORTACATH PLACEMENT    . TUBAL LIGATION  2001    There were no vitals filed for this visit.  Subjective Assessment - 10/23/19 1034    Subjective  Patient reports she is ready to be done with therapy and that she has been compiling all the exercises to do at home. Slight pain in her knee right now.  Currently in Pain?  Yes    Pain Score  1     Pain Location  Knee    Pain Orientation  Right    Pain Descriptors / Indicators  Aching         [facility] PT Assessment - 10/23/19 0001      Assessment   Medical Diagnosis  RT knee pain s/p RT patellofemoral arthroplasty    Referring Provider (PT)  Marchia Bond MD    Onset Date/Surgical Date  07/14/19      Observation/Other Assessments   Focus on Therapeutic Outcomes (FOTO)   27.5% limited   was 31% limited     Strength   Right Hip Flexion  4+/5    Right Hip Extension  4+/5    Right Hip ABduction  5/5    Left Hip Flexion  4+/5    Left Hip External Rotation  4+/5    Left Hip ABduction  5/5    Right  Knee Flexion  5/5    Right Knee Extension  5/5    Left Knee Flexion  5/5    Left Knee Extension  5/5    Right Ankle Dorsiflexion  5/5    Left Ankle Dorsiflexion  5/5                    [facility] Adult PT Treatment/Exercise - 10/23/19 0001      Knee/Hip Exercises: Supine   Knee Extension  AROM;Right    Knee Extension Limitations  8    Knee Flexion  AROM;Right    Knee Flexion Limitations  120             PT Education - 10/23/19 1101    Education Details  Discussed re-assessment findings and continuing HEP as well as provided information on local gym to continue exercising at.    Person(s) Educated  Patient    Methods  Explanation;Handout    Comprehension  Verbalized understanding       PT Short Term Goals - 09/22/19 1311      PT SHORT TERM GOAL #1   Title  Patient will be independent with initial HEP to improve functional outcomes    Baseline  Patient reports compliance with HEP    Time  4    Period  Weeks    Status  Achieved    Target Date  08/21/19      PT SHORT TERM GOAL #2   Title  Patient will improve FOTO score to <40% to indicate improvement in functional outcomes    Baseline  Current: 31% limited    Time  4    Period  Weeks    Status  Achieved    Target Date  08/21/19        PT Long Term Goals - 10/23/19 1102      PT LONG TERM GOAL #1   Title  Patient will improve FOTO score to <35% to indicate improvement in functional outcomes    Baseline  Current: 31% limited    Time  8    Period  Weeks    Status  Achieved      PT LONG TERM GOAL #2   Title  Patient will be able to maintain tandem stance >30 seconds on BLEs to improve stability and reduce risk for falls    Time  8    Period  Weeks    Status  Achieved      PT LONG TERM GOAL #3   Title  Patient will have RT knee AROM 0-120 degrees to improve functional mobility and facilitate squatting to pick up items from floor.    Baseline  10/23/19: RT knee AROM 8-120 degrees    Time  8     Period  Weeks    Status  Partially Met      PT LONG TERM GOAL #4   Title  Patient will have equal to or > 4+/5 MMT throughout RLE to improve ability to perform functional mobility, stair ambulation and ADLs.    Time  8    Period  Weeks    Status  Achieved            Plan - 10/23/19 1106    Clinical Impression Statement  Performed a re-assessment of patient's progress towards goals. Patient achieved  all short term goals and 3 out of 4 long term goals. Patient continues to have some deficits in RT knee extension AROM and some deficits in strength. Patient reports feeling ready to be finished with therapy and that she has been keeping all of her exercise printouts together and has been performing it regularly. Encouraged continuing regular compliance with Hep and provided information on going to the local gym. Patient is being discharged at this time as the patient has met the majority of her goals and is pleased with her current functional status.    Examination-Activity Limitations  Bathing;Lift;Stand;Locomotion Level;Bend;Transfers;Carry;Dressing;Squat;Stairs;Sleep    Examination-Participation Restrictions  Yard Work;Community Activity    Stability/Clinical Decision Making  Stable/Uncomplicated    Rehab Potential  Good    PT Frequency  --   1-2x/week for total of 8 visits over 8 week certification   PT Duration  8 weeks    PT Treatment/Interventions  ADLs/Self Care Home Management;Therapeutic exercise;Therapeutic activities;Patient/family education;Orthotic Fit/Training;Taping;Joint Manipulations;Splinting;Energy conservation;Neuromuscular re-education;Balance training;DME Instruction;Gait training;Stair training;Functional mobility training;Manual techniques;Compression bandaging    PT Next Visit Plan  Discharged    PT Home Exercise Plan  07/23/19: quad set, heel slides, ankle pump: 12/16:  LAQ, heel raises, LAQ, active hamstring stretch and heel prop for extension; 2/24 calf stretch,  prone quad stretch3/4: self massage; 08/26/19: POE; 09/24/19: supine hamstring stretch and STS. 4/22 lateral stepping, forward heel taps, gastroc stretch       Patient will benefit from skilled therapeutic intervention in order to improve the following deficits and impairments:  Abnormal gait, Pain, Improper body mechanics, Decreased mobility, Decreased activity tolerance, Decreased endurance, Decreased range of motion, Decreased strength, Hypomobility, Decreased balance, Difficulty walking, Increased edema, Impaired flexibility  Visit Diagnosis: Right knee pain, unspecified chronicity  Other abnormalities of gait and mobility  Stiffness of right knee, not elsewhere classified     Problem List Patient Active Problem List   Diagnosis Date Noted  . Patellofemoral arthritis of right knee 07/14/2019  . History of DVT in adulthood 06/23/2019  . Right leg pain 03/12/2017  . Gait abnormality 03/12/2017  . Radiculopathy due to lumbar intervertebral disc disorder 09/21/2016  . Spondylosis without myelopathy or radiculopathy, lumbar region 09/21/2016  . History of anxiety 09/08/2016  . Primary osteoarthritis of right knee 08/30/2016  . Primary insomnia 08/30/2016  . Myofascial pain 08/30/2016  . Sciatica, right side 12/12/2015  . Right knee pain 12/12/2015  . Morbid obesity ([place]) 08/12/2015  . Lumbago 02/10/2014  . Chronic anxiety 07/08/2013  . Swelling of breast 04/08/2013  . Chronic pain syndrome 03/11/2013  . Port catheter in place 12/31/2012  . Unspecified constipation 10/01/2012  . Anemia 10/01/2012  . Rectal bleeding 10/01/2012  .  Elevated alkaline phosphatase level 10/01/2012  . MS (multiple sclerosis) ([place]) 09/02/2012  . DVT (deep venous thrombosis) ([place]) 09/02/2012  . ANEMIA 04/12/2010  . NAUSEA WITH VOMITING 04/12/2010  . OTHER DYSPHAGIA 04/12/2010   Clarene Critchley PT, DPT 11:08 AM, 10/23/19 [place], [place], [address] Phone: [phone]   Fax:  [phone]  Name: MIEKA LEATON MRN: [id number] Date of Birth: Sep 06, 1970

## 2019-11-02 ENCOUNTER — Other Ambulatory Visit: Payer: Self-pay | Admitting: Family Medicine

## 2019-11-02 NOTE — Telephone Encounter (Signed)
Please clarify with patient if she taking 50 mg 1 daily?

## 2019-11-03 NOTE — Telephone Encounter (Signed)
Patient states she is taking the 50mg  1 per day. Also wanting refill for metaxalone 800mg  1 tablet 3 x daily prn muscle spasms, she said she was on it in 2018 and has started having spasms again.

## 2019-11-03 NOTE — Telephone Encounter (Signed)
May have 6 months on the sertraline Cannot refill muscle relaxers.  Current guidelines state we cannot use muscle relaxers with pain medicines because of the risk of overdose or potential severe side effects

## 2019-11-04 ENCOUNTER — Encounter (HOSPITAL_COMMUNITY): Payer: Self-pay

## 2019-11-04 NOTE — Telephone Encounter (Signed)
Sertraline sent to pharmacy. Left message to return call

## 2019-11-04 NOTE — Telephone Encounter (Signed)
Pt returned call and verbalized understanding  

## 2019-12-29 ENCOUNTER — Other Ambulatory Visit: Payer: Self-pay | Admitting: Family Medicine

## 2020-01-21 ENCOUNTER — Other Ambulatory Visit: Payer: Self-pay | Admitting: Family Medicine

## 2020-01-25 ENCOUNTER — Ambulatory Visit (INDEPENDENT_AMBULATORY_CARE_PROVIDER_SITE_OTHER): Payer: Medicare Other | Admitting: Family Medicine

## 2020-01-25 ENCOUNTER — Encounter: Payer: Self-pay | Admitting: Family Medicine

## 2020-01-25 ENCOUNTER — Other Ambulatory Visit: Payer: Self-pay

## 2020-01-25 VITALS — BP 130/84 | Temp 97.2°F | Wt 249.8 lb

## 2020-01-25 DIAGNOSIS — M5431 Sciatica, right side: Secondary | ICD-10-CM

## 2020-01-25 DIAGNOSIS — M25561 Pain in right knee: Secondary | ICD-10-CM

## 2020-01-25 DIAGNOSIS — Z79891 Long term (current) use of opiate analgesic: Secondary | ICD-10-CM | POA: Diagnosis not present

## 2020-01-25 DIAGNOSIS — G35 Multiple sclerosis: Secondary | ICD-10-CM | POA: Diagnosis not present

## 2020-01-25 MED ORDER — HYDROMORPHONE HCL 4 MG PO TABS: ORAL_TABLET | ORAL | 0 refills | Status: DC

## 2020-01-25 MED ORDER — NALOXONE HCL 4 MG/0.1ML NA LIQD: NASAL | 2 refills | Status: DC

## 2020-01-25 NOTE — Progress Notes (Signed)
Subjective:    Patient ID: Melinda Moore, female    DOB: 09-24-1970, 49 y.o.   MRN: [id number]  HPI This patient was seen today for chronic pain  The medication list was reviewed and updated.   -Compliance with medication: Dilaudin 4 mg   - Number patient states they take daily: 3.5-4  -when was the last dose patient took? Yesterday afternoon around 6 pm  The patient was advised the importance of maintaining medication and not using illegal substances with these.  Here for refills and follow up  The patient was educated that we can provide 3 monthly scripts for their medication, it is their responsibility to follow the instructions.  Side effects or complications from medications: none  Patient is aware that pain medications are meant to minimize the severity of the pain to allow their pain levels to improve to allow for better function. They are aware of that pain medications cannot totally remove their pain.  Due for UDT ( at least once per year) :   Scale of 1 to 10 ( 1 is least 10 is most) Your pain level without the medicine: 8 Your pain level with medication 5  Scale 1 to 10 ( 1-helps very little, 10 helps very well) How well does your pain medication reduce your pain so you can function better through out the day? 5  Patient relates she gets a lot of pain from MS it causes a burning discomfort in her lower legs she also relates right knee pain for which she has had surgery she states the pain medicine does take the edge off the pain without it she would have a difficult time functioning she states it does not cause drowsiness or sleepiness.  Patient states that she will strongly consider getting the Covid vaccine through [facility]     Review of Systems  Constitutional: Negative for activity change, appetite change and fatigue.  HENT: Negative for congestion and rhinorrhea.   Respiratory: Negative for cough and shortness of breath.   Cardiovascular: Negative  for chest pain and leg swelling.  Gastrointestinal: Negative for abdominal pain and diarrhea.  Endocrine: Negative for polydipsia and polyphagia.  Skin: Negative for color change.  Neurological: Negative for dizziness and weakness.  Psychiatric/Behavioral: Negative for behavioral problems and confusion.       Objective:   Physical Exam Vitals reviewed.  Constitutional:      General: She is not in acute distress. HENT:     Head: Normocephalic and atraumatic.  Eyes:     General:        Right eye: No discharge.        Left eye: No discharge.  Neck:     Trachea: No tracheal deviation.  Cardiovascular:     Rate and Rhythm: Normal rate and regular rhythm.     Heart sounds: Normal heart sounds. No murmur heard.   Pulmonary:     Effort: Pulmonary effort is normal. No respiratory distress.     Breath sounds: Normal breath sounds.  Lymphadenopathy:     Cervical: No cervical adenopathy.  Skin:    General: Skin is warm and dry.  Neurological:     Mental Status: She is alert.     Coordination: Coordination normal.  Psychiatric:        Behavior: Behavior normal.           Assessment & Plan:  The patient was seen in followup for chronic pain. A review over at their current pain  status was discussed. Drug registry was checked. Prescriptions were given.  Regular follow-up recommended. Discussion was held regarding the importance of compliance with medication as well as pain medication contract.  Patient was informed that medication may cause drowsiness and should not be combined  with other medications/alcohol or street drugs. If the patient feels medication is causing altered alertness then do not drive or operate dangerous equipment.  Our goal is to gradually reduce the pain medication I have talked with the patient about this I believe that this will be a healthier approach that would lessen her risk of having any type of severe unintended effect of the pain medicine.  We will  cut back on the number of tablets per month by 5 to reduce her down to 120 tablets/month the next time she is in we will discuss further if she would like to reduce the dose further  Narcan spray was sent in per protocol

## 2020-01-27 LAB — TOXASSURE SELECT 13 (MW), URINE

## 2020-02-01 ENCOUNTER — Other Ambulatory Visit: Payer: Self-pay

## 2020-02-01 ENCOUNTER — Ambulatory Visit
Admission: EM | Admit: 2020-02-01 | Discharge: 2020-02-01 | Disposition: A | Discharge status: Home / Self Care | Payer: 59 | Attending: Emergency Medicine | Admitting: Emergency Medicine

## 2020-02-01 DIAGNOSIS — J069 Acute upper respiratory infection, unspecified: Secondary | ICD-10-CM | POA: Diagnosis not present

## 2020-02-01 DIAGNOSIS — Z1152 Encounter for screening for COVID-19: Secondary | ICD-10-CM | POA: Diagnosis not present

## 2020-02-01 MED ORDER — PREDNISONE 10 MG PO TABS: 20.0000 mg | ORAL_TABLET | Freq: Every day | ORAL | 0 refills | Status: DC

## 2020-02-01 MED ORDER — BENZONATATE 100 MG PO CAPS: 100.0000 mg | ORAL_CAPSULE | Freq: Three times a day (TID) | ORAL | 0 refills | Status: DC

## 2020-02-01 MED ORDER — ONDANSETRON 4 MG PO TBDP: 4.0000 mg | ORAL_TABLET | Freq: Three times a day (TID) | ORAL | 0 refills | Status: DC | PRN

## 2020-02-01 MED ORDER — CETIRIZINE HCL 10 MG PO TABS: 10.0000 mg | ORAL_TABLET | Freq: Every day | ORAL | 0 refills | Status: DC

## 2020-02-01 MED ORDER — FLUTICASONE PROPIONATE 50 MCG/ACT NA SUSP: 1.0000 | Freq: Every day | NASAL | 0 refills | Status: DC

## 2020-02-01 NOTE — ED Provider Notes (Addendum)
[facility]   [id number] 02/01/20 Arrival Time: 0809   CC: COVID symptoms  SUBJECTIVE: History from: patient.  Melinda Moore is a 49 y.o. female who presents with chills and fever, cough, congestion nausea for the past few days.  Reports she has been exposed to Covid positive patient.  Denies sick exposure to  flu or strep.  Denies recent travel.  Start this medication regularly.  Denies aggravating factors.  Denies previous symptoms in the past.   Denies fatigue, sinus pain, rhinorrhea, sore throat, SOB, wheezing, chest pain,  changes in bowel or bladder habits.      ROS: As per HPI.  All other pertinent ROS negative.     Past Medical History:  Diagnosis Date  . Anemia   . Anxiety   . Bell's palsy   . Depression   . DVT (deep venous thrombosis) ([facility]) 09/02/2012   [place] on 02/13/2012 showed acute DVT in left calf veins and posterior tibial veins  . Fibromyalgia   . GERD (gastroesophageal reflux disease)   . History of blood transfusion 2000  . History of trigeminal neuralgia   . HTN (hypertension)    patient denies was taking a blood pressure medication in early 2000 but it was migraines  . Leukopenia   . MS (multiple sclerosis) ([facility]) 1997  . Neuropathic pain   . Peripheral edema   . Port catheter in place 12/31/2012  . Right bundle branch block    Past Surgical History:  Procedure Laterality Date  . ABDOMINAL HYSTERECTOMY    . CHOLECYSTECTOMY    . ESOPHAGOGASTRODUODENOSCOPY  2011   Dr. Jena Gauss. Possible cervical esophageal with status post disruption with passage of Maloney dilator, glycol esophageal erythema, antral erosions. Biopsy from the stomach revealed minimal chronic inactive inflammation but negative for H. pylori  . LAPAROSCOPIC GASTRIC SLEEVE RESECTION N/A 04/17/2016   Procedure: LAPAROSCOPIC GASTRIC SLEEVE RESECTION WITH UPPER ENDOSCOPY;  Surgeon: Glenna Fellows, MD;  Location: [facility];  Service: General;  Laterality: N/A;  . PATELLA-FEMORAL ARTHROPLASTY  Right 07/14/2019   Procedure: PATELLA-FEMORAL ARTHROPLASTY;  Surgeon: Teryl Lucy, MD;  Location: [facility];  Service: Orthopedics;  Laterality: Right;  . PORTACATH PLACEMENT    . TUBAL LIGATION  2001   Allergies  Allergen Reactions  . Ambien [Zolpidem Tartrate]     nightmares  . Tape Itching and Rash   No current facility-administered medications on file prior to encounter.   Current Outpatient Medications on File Prior to Encounter  Medication Sig Dispense Refill  . baclofen (LIORESAL) 10 MG tablet Take 1 tablet (10 mg total) by mouth 3 (three) times daily. As needed for muscle spasm 50 tablet 0  . Bioflavonoid Products (BIOFLEX) TABS Take 2 tablets by mouth daily.    Marland Kitchen buPROPion (WELLBUTRIN SR) 150 MG 12 hr tablet TAKE ONE (1) TABLET BY MOUTH 3 TIMES DAILY 90 tablet 5  . carbamazepine (TEGRETOL XR) 200 MG 12 hr tablet Take 1 tablet (200 mg total) by mouth 3 (three) times daily as needed. 90 tablet 1  . Cholecalciferol (VITAMIN D3) 125 MCG (5000 UT) TABS Take 20,000 Units by mouth 2 (two) times daily.    . Fingolimod HCl (GILENYA) 0.5 MG CAPS Take 0.5 mg by mouth daily.     Marland Kitchen gabapentin (NEURONTIN) 300 MG capsule Take 1 capsule (300 mg total) by mouth 3 (three) times daily. (Patient taking differently: Take 1,200 mg by mouth 3 (three) times daily. States 300mg  tabs-  Takes 4 tab in am, 4 at  lunch, 4 at bedtime) 90 capsule 6  . HYDROmorphone (DILAUDID) 4 MG tablet Take one tablet po q 4 hrs prn severe pain. No more than 4.5 tablets per day 130 tablet 0  . HYDROmorphone (DILAUDID) 4 MG tablet Take one tablet po q 4 hrs prn severe pain. No more than 4.5 tablets per day 125 tablet 0  . HYDROmorphone (DILAUDID) 4 MG tablet Take one tablet po q 4 hrs prn severe pain. No more than 4.5 tablets per day 120 tablet 0  . LUTEIN PO Take 1 tablet by mouth daily.    . methylphenidate (RITALIN) 10 MG tablet Take 1 tablet (10 mg total) by mouth 2 (two) times daily. (Patient taking differently: Take 10 mg by  mouth 2 (two) times daily as needed (attention/energy). ) 60 tablet 0  . Multiple Vitamin (MULTIVITAMIN WITH MINERALS) TABS tablet Take 1 tablet by mouth daily. ALIVE    . Multiple Vitamins-Minerals (EMERGEN-C IMMUNE) PACK Take 1 packet by mouth once a week.     . naloxone (NARCAN) nasal spray 4 mg/0.1 mL Use as directed if accidental overdose 1 each 2  . ondansetron (ZOFRAN) 4 MG tablet Take 1 tablet (4 mg total) by mouth every 6 (six) hours. (Patient taking differently: Take 4 mg by mouth every 6 (six) hours as needed (nausea). ) 12 tablet 0  . ondansetron (ZOFRAN) 4 MG tablet Take 1 tablet (4 mg total) by mouth every 8 (eight) hours as needed for nausea or vomiting. 10 tablet 0  . pantoprazole (PROTONIX) 40 MG tablet Take 40 mg by mouth daily.    . Polyethylene Glycol 400 (BLINK TEARS) 0.25 % SOLN Place 1 drop into the right eye 2 (two) times daily as needed (dry eyes).     . potassium chloride SA (K-DUR) 20 MEQ tablet TAKE ONE TABLET BY MOUTH TWICE A DAY (Patient taking differently: Take 20 mEq by mouth daily. ) 60 tablet 5  . sertraline (ZOLOFT) 50 MG tablet Take 0.5 tablets (25 mg total) by mouth 2 (two) times daily. 30 tablet 5  . torsemide (DEMADEX) 20 MG tablet TAKE TWO TABLETS BY MOUTH IN THE MORNINGAND ONE TABLET AT NOON IF NEEDED (Patient taking differently: Take 20 mg by mouth daily. ) 90 tablet 5   Social History   Socioeconomic History  . Marital status: Married    Spouse name: Not on file  . Number of children: 2  . Years of education: some college  . Highest education level: Not on file  Occupational History  . Occupation: disabled  Tobacco Use  . Smoking status: Former Smoker    Types: Cigarettes    Quit date: 1990    Years since quitting: 31.6  . Smokeless tobacco: Never Used  Vaping Use  . Vaping Use: Never used  Substance and Sexual Activity  . Alcohol use: No  . Drug use: No  . Sexual activity: Not on file  Other Topics Concern  . Not on file  Social History  Narrative   Lives at home with husband and 2 daughters   Right handed   Takes 1/2 caffeine pill per day   Social Determinants of Health   Financial Resource Strain:   . Difficulty of Paying Living Expenses: Not on file  Food Insecurity:   . Worried About Programme researcher, broadcasting/film/video in the Last Year: Not on file  . Ran Out of Food in the Last Year: Not on file  Transportation Needs:   . Lack of Transportation (  Medical): Not on file  . Lack of Transportation (Non-Medical): Not on file  Physical Activity:   . Days of Exercise per Week: Not on file  . Minutes of Exercise per Session: Not on file  Stress:   . Feeling of Stress : Not on file  Social Connections:   . Frequency of Communication with Friends and Family: Not on file  . Frequency of Social Gatherings with Friends and Family: Not on file  . Attends Religious Services: Not on file  . Active Member of Clubs or Organizations: Not on file  . Attends Banker Meetings: Not on file  . Marital Status: Not on file  Intimate Partner Violence:   . Fear of Current or Ex-Partner: Not on file  . Emotionally Abused: Not on file  . Physically Abused: Not on file  . Sexually Abused: Not on file   Family History  Problem Relation Age of Onset  . Heart disease Father   . Hyperlipidemia Father   . Congestive Heart Failure Father   . Hypertension Sister   . Other Paternal Uncle        Possible colon cancer, age greater than 62  . Cirrhosis Brother        etoh  . Healthy Mother   . Healthy Daughter   . Healthy Daughter     OBJECTIVE:  Vitals:   02/01/20 0828  BP: 119/79  Pulse: (!) 112  Resp: (!) 22  Temp: 98.6 F (37 C)  SpO2: 92%     General appearance: alert; appears fatigued, but nontoxic; speaking in full sentences and tolerating own secretions HEENT: NCAT; Ears: EACs clear, TMs pearly gray; Eyes: PERRL.  EOM grossly intact. Sinuses: nontender; Nose: nares patent without rhinorrhea, Throat: oropharynx clear,  tonsils non erythematous or enlarged, uvula midline  Neck: supple without LAD Lungs: unlabored respirations, symmetrical air entry; cough: prersent; no respiratory distress; CTAB Heart: regular rate and rhythm.  Radial pulses 2+ symmetrical bilaterally Skin: warm and dry Psychological: alert and cooperative; normal mood and affect  LABS:  No results found for this or any previous visit (from the past 24 hour(s)).   ASSESSMENT & PLAN:  1. Encounter for screening for COVID-19     No orders of the defined types were placed in this encounter.   Discharge Instructions  COVID testing ordered.  It will take between 2-7 days for test results.  Someone will contact you regarding abnormal results.    In the meantime: You should remain isolated in your home for 10 days from symptom onset AND greater than 72 hours after symptoms resolution (absence of fever without the use of fever-reducing medication and improvement in respiratory symptoms), whichever is longer Get plenty of rest and push fluids Tessalon Perles prescribed for cough zyrtec for nasal congestion, runny nose, and/or sore throat flonase for nasal congestion and runny nose Low-dose prednisone was prescribed Use medications daily for symptom relief Use OTC medications like ibuprofen or tylenol as needed fever or pain Call or go to the ED if you have any new or worsening symptoms such as fever, worsening cough, shortness of breath, chest tightness, chest pain, turning blue, changes in mental status, etc...   Reviewed expectations re: course of current medical issues. Questions answered. Outlined signs and symptoms indicating need for more acute intervention. Patient verbalized understanding. After Visit Summary given.     Note: This document was prepared using Dragon voice recognition software and may include unintentional dictation errors.  Durward Parcel, FNP 02/01/20 0847    Durward Parcel, FNP 02/01/20  [phone]

## 2020-02-01 NOTE — ED Triage Notes (Signed)
covid exposure ---- no symptoms  

## 2020-02-01 NOTE — ED Triage Notes (Signed)
Upon taking v/s pt is de sating , provider made aware

## 2020-02-01 NOTE — Discharge Instructions (Signed)
COVID testing ordered.  It will take between 2-7 days for test results.  Someone will contact you regarding abnormal results.    In the meantime: You should remain isolated in your home for 10 days from symptom onset AND greater than 72 hours after symptoms resolution (absence of fever without the use of fever-reducing medication and improvement in respiratory symptoms), whichever is longer Get plenty of rest and push fluids Tessalon Perles prescribed for cough zyrtec for nasal congestion, runny nose, and/or sore throat flonase for nasal congestion and runny nose Low-dose prednisone was prescribed Use medications daily for symptom relief Use OTC medications like ibuprofen or tylenol as needed fever or pain Call or go to the ED if you have any new or worsening symptoms such as fever, worsening cough, shortness of breath, chest tightness, chest pain, turning blue, changes in mental status, etc..Marland Kitchen

## 2020-02-03 LAB — SARS-COV-2, NAA 2 DAY TAT

## 2020-02-03 LAB — NOVEL CORONAVIRUS, NAA: SARS-CoV-2, NAA: DETECTED — AB

## 2020-02-04 ENCOUNTER — Inpatient Hospital Stay (HOSPITAL_COMMUNITY)
Admission: EM | Admit: 2020-02-04 | Discharge: 2020-02-15 | DRG: 177 | Disposition: A | Discharge status: Home / Self Care | Payer: 59 | Attending: Family Medicine | Admitting: Family Medicine

## 2020-02-04 ENCOUNTER — Other Ambulatory Visit: Payer: Self-pay

## 2020-02-04 ENCOUNTER — Inpatient Hospital Stay (HOSPITAL_COMMUNITY): Payer: 59

## 2020-02-04 ENCOUNTER — Emergency Department (HOSPITAL_COMMUNITY): Payer: 59

## 2020-02-04 DIAGNOSIS — J982 Interstitial emphysema: Secondary | ICD-10-CM | POA: Diagnosis present

## 2020-02-04 DIAGNOSIS — Z87891 Personal history of nicotine dependence: Secondary | ICD-10-CM

## 2020-02-04 DIAGNOSIS — I499 Cardiac arrhythmia, unspecified: Secondary | ICD-10-CM | POA: Diagnosis not present

## 2020-02-04 DIAGNOSIS — K219 Gastro-esophageal reflux disease without esophagitis: Secondary | ICD-10-CM | POA: Diagnosis present

## 2020-02-04 DIAGNOSIS — R7989 Other specified abnormal findings of blood chemistry: Secondary | ICD-10-CM | POA: Diagnosis present

## 2020-02-04 DIAGNOSIS — R0902 Hypoxemia: Secondary | ICD-10-CM | POA: Diagnosis not present

## 2020-02-04 DIAGNOSIS — R748 Abnormal levels of other serum enzymes: Secondary | ICD-10-CM | POA: Diagnosis present

## 2020-02-04 DIAGNOSIS — F419 Anxiety disorder, unspecified: Secondary | ICD-10-CM | POA: Diagnosis present

## 2020-02-04 DIAGNOSIS — Z9049 Acquired absence of other specified parts of digestive tract: Secondary | ICD-10-CM | POA: Diagnosis not present

## 2020-02-04 DIAGNOSIS — G35 Multiple sclerosis: Secondary | ICD-10-CM | POA: Diagnosis present

## 2020-02-04 DIAGNOSIS — Z86718 Personal history of other venous thrombosis and embolism: Secondary | ICD-10-CM | POA: Diagnosis not present

## 2020-02-04 DIAGNOSIS — G894 Chronic pain syndrome: Secondary | ICD-10-CM | POA: Diagnosis present

## 2020-02-04 DIAGNOSIS — J1282 Pneumonia due to coronavirus disease 2019: Secondary | ICD-10-CM | POA: Diagnosis present

## 2020-02-04 DIAGNOSIS — Z743 Need for continuous supervision: Secondary | ICD-10-CM | POA: Diagnosis not present

## 2020-02-04 DIAGNOSIS — R531 Weakness: Secondary | ICD-10-CM | POA: Diagnosis not present

## 2020-02-04 DIAGNOSIS — R7401 Elevation of levels of liver transaminase levels: Secondary | ICD-10-CM

## 2020-02-04 DIAGNOSIS — R0600 Dyspnea, unspecified: Secondary | ICD-10-CM

## 2020-02-04 DIAGNOSIS — A0839 Other viral enteritis: Secondary | ICD-10-CM | POA: Diagnosis present

## 2020-02-04 DIAGNOSIS — M79604 Pain in right leg: Secondary | ICD-10-CM

## 2020-02-04 DIAGNOSIS — Z8249 Family history of ischemic heart disease and other diseases of the circulatory system: Secondary | ICD-10-CM | POA: Diagnosis not present

## 2020-02-04 DIAGNOSIS — J9601 Acute respiratory failure with hypoxia: Secondary | ICD-10-CM | POA: Diagnosis present

## 2020-02-04 DIAGNOSIS — I10 Essential (primary) hypertension: Secondary | ICD-10-CM | POA: Diagnosis not present

## 2020-02-04 DIAGNOSIS — K760 Fatty (change of) liver, not elsewhere classified: Secondary | ICD-10-CM | POA: Diagnosis not present

## 2020-02-04 DIAGNOSIS — Z96651 Presence of right artificial knee joint: Secondary | ICD-10-CM | POA: Diagnosis present

## 2020-02-04 DIAGNOSIS — F329 Major depressive disorder, single episode, unspecified: Secondary | ICD-10-CM | POA: Diagnosis present

## 2020-02-04 DIAGNOSIS — Z83438 Family history of other disorder of lipoprotein metabolism and other lipidemia: Secondary | ICD-10-CM

## 2020-02-04 DIAGNOSIS — M797 Fibromyalgia: Secondary | ICD-10-CM | POA: Diagnosis not present

## 2020-02-04 DIAGNOSIS — U071 COVID-19: Secondary | ICD-10-CM | POA: Diagnosis not present

## 2020-02-04 DIAGNOSIS — Z79899 Other long term (current) drug therapy: Secondary | ICD-10-CM

## 2020-02-04 DIAGNOSIS — R600 Localized edema: Secondary | ICD-10-CM

## 2020-02-04 DIAGNOSIS — G500 Trigeminal neuralgia: Secondary | ICD-10-CM | POA: Diagnosis not present

## 2020-02-04 DIAGNOSIS — J9600 Acute respiratory failure, unspecified whether with hypoxia or hypercapnia: Secondary | ICD-10-CM | POA: Diagnosis not present

## 2020-02-04 DIAGNOSIS — R112 Nausea with vomiting, unspecified: Secondary | ICD-10-CM

## 2020-02-04 DIAGNOSIS — Z888 Allergy status to other drugs, medicaments and biological substances status: Secondary | ICD-10-CM

## 2020-02-04 DIAGNOSIS — R069 Unspecified abnormalities of breathing: Secondary | ICD-10-CM | POA: Diagnosis not present

## 2020-02-04 LAB — COMPREHENSIVE METABOLIC PANEL
ALT: 74 U/L — ABNORMAL HIGH (ref 0–44)
AST: 100 U/L — ABNORMAL HIGH (ref 15–41)
Albumin: 3.5 g/dL (ref 3.5–5.0)
Alkaline Phosphatase: 132 U/L — ABNORMAL HIGH (ref 38–126)
Anion gap: 15 (ref 5–15)
BUN: 16 mg/dL (ref 6–20)
CO2: 24 mmol/L (ref 22–32)
Calcium: 8.6 mg/dL — ABNORMAL LOW (ref 8.9–10.3)
Chloride: 98 mmol/L (ref 98–111)
Creatinine, Ser: 1.01 mg/dL — ABNORMAL HIGH (ref 0.44–1.00)
GFR calc Af Amer: >60 mL/min (ref >60)
GFR calc non Af Amer: >60 mL/min (ref >60)
Glucose, Bld: 113 mg/dL — ABNORMAL HIGH (ref 70–99)
Potassium: 3.5 mmol/L (ref 3.5–5.1)
Sodium: 137 mmol/L (ref 135–145)
Total Bilirubin: 0.7 mg/dL (ref 0.3–1.2)
Total Protein: 8.2 g/dL — ABNORMAL HIGH (ref 6.5–8.1)

## 2020-02-04 LAB — BLOOD GAS, ARTERIAL
Acid-Base Excess: 0.9 mmol/L (ref 0.0–2.0)
Bicarbonate: 25.1 mmol/L (ref 20.0–28.0)
FIO2: 48
O2 Saturation: 90.9 %
Patient temperature: 37
pCO2 arterial: 39 mmHg (ref 32.0–48.0)
pH, Arterial: 7.42 (ref 7.350–7.450)
pO2, Arterial: 64.3 mmHg — ABNORMAL LOW (ref 83.0–108.0)

## 2020-02-04 LAB — CBC WITH DIFFERENTIAL/PLATELET
Abs Immature Granulocytes: 0.05 10*3/uL (ref 0.00–0.07)
Basophils Absolute: 0 10*3/uL (ref 0.0–0.1)
Basophils Relative: 0 %
Eosinophils Absolute: 0 10*3/uL (ref 0.0–0.5)
Eosinophils Relative: 0 %
HCT: 37.2 % (ref 36.0–46.0)
Hemoglobin: 11.6 g/dL — ABNORMAL LOW (ref 12.0–15.0)
Immature Granulocytes: 1 %
Lymphocytes Relative: 7 %
Lymphs Abs: 0.4 10*3/uL — ABNORMAL LOW (ref 0.7–4.0)
MCH: 26.5 pg (ref 26.0–34.0)
MCHC: 31.2 g/dL (ref 30.0–36.0)
MCV: 84.9 fL (ref 80.0–100.0)
Monocytes Absolute: 0.3 10*3/uL (ref 0.1–1.0)
Monocytes Relative: 5 %
Neutro Abs: 4.6 10*3/uL (ref 1.7–7.7)
Neutrophils Relative %: 87 %
Platelets: 307 10*3/uL (ref 150–400)
RBC: 4.38 MIL/uL (ref 3.87–5.11)
RDW: 14.8 % (ref 11.5–15.5)
WBC: 5.2 10*3/uL (ref 4.0–10.5)
nRBC: 0 % (ref 0.0–0.2)

## 2020-02-04 LAB — PROCALCITONIN: Procalcitonin: <0.1 ng/mL

## 2020-02-04 LAB — FIBRINOGEN: Fibrinogen: 762 mg/dL — ABNORMAL HIGH (ref 210–475)

## 2020-02-04 LAB — POC URINE PREG, ED: Preg Test, Ur: NEGATIVE

## 2020-02-04 LAB — FERRITIN: Ferritin: 233 ng/mL (ref 11–307)

## 2020-02-04 LAB — LACTIC ACID, PLASMA
Lactic Acid, Venous: 1.6 mmol/L (ref 0.5–1.9)
Lactic Acid, Venous: 1 mmol/L (ref 0.5–1.9)

## 2020-02-04 LAB — HIV ANTIBODY (ROUTINE TESTING W REFLEX): HIV Screen 4th Generation wRfx: NONREACTIVE

## 2020-02-04 LAB — LACTATE DEHYDROGENASE: LDH: 361 U/L — ABNORMAL HIGH (ref 98–192)

## 2020-02-04 LAB — ABO/RH: ABO/RH(D): O POS

## 2020-02-04 LAB — C-REACTIVE PROTEIN: CRP: 17.4 mg/dL — ABNORMAL HIGH (ref <1.0)

## 2020-02-04 LAB — LIPASE, BLOOD: Lipase: 126 U/L — ABNORMAL HIGH (ref 11–51)

## 2020-02-04 LAB — D-DIMER, QUANTITATIVE: D-Dimer, Quant: 1.62 ug/mL-FEU — ABNORMAL HIGH (ref 0.00–0.50)

## 2020-02-04 LAB — GAMMA GT: GGT: 235 U/L — ABNORMAL HIGH (ref 7–50)

## 2020-02-04 LAB — TRIGLYCERIDES: Triglycerides: 127 mg/dL (ref <150)

## 2020-02-04 MED ORDER — METHYLPREDNISOLONE SODIUM SUCC 125 MG IJ SOLR
55.0000 mg | Freq: Once | INTRAMUSCULAR | Status: AC
  Administered 2020-02-04: 55 mg via INTRAVENOUS
  Filled 2020-02-04: qty 2

## 2020-02-04 MED ORDER — BARICITINIB 2 MG PO TABS
4.0000 mg | ORAL_TABLET | Freq: Every day | ORAL | Status: DC
  Administered 2020-02-04 – 2020-02-15 (×12): 4 mg via ORAL
  Filled 2020-02-04 (×12): qty 2

## 2020-02-04 MED ORDER — ONDANSETRON HCL 4 MG/2ML IJ SOLN
4.0000 mg | Freq: Four times a day (QID) | INTRAMUSCULAR | Status: DC | PRN
  Administered 2020-02-04 – 2020-02-05 (×3): 4 mg via INTRAVENOUS
  Filled 2020-02-04 (×3): qty 2

## 2020-02-04 MED ORDER — METHYLPREDNISOLONE SODIUM SUCC 125 MG IJ SOLR
55.0000 mg | Freq: Two times a day (BID) | INTRAMUSCULAR | Status: AC
  Administered 2020-02-04 – 2020-02-14 (×20): 55 mg via INTRAVENOUS
  Filled 2020-02-04 (×20): qty 2

## 2020-02-04 MED ORDER — SODIUM CHLORIDE 0.9 % IV SOLN
100.0000 mg | Freq: Every day | INTRAVENOUS | Status: AC
  Administered 2020-02-05 – 2020-02-08 (×4): 100 mg via INTRAVENOUS
  Filled 2020-02-04 (×4): qty 20

## 2020-02-04 MED ORDER — ENOXAPARIN SODIUM 40 MG/0.4ML ~~LOC~~ SOLN
40.0000 mg | SUBCUTANEOUS | Status: DC
  Administered 2020-02-04: 40 mg via SUBCUTANEOUS
  Filled 2020-02-04: qty 0.4

## 2020-02-04 MED ORDER — ZINC SULFATE 220 (50 ZN) MG PO CAPS
220.0000 mg | ORAL_CAPSULE | Freq: Every day | ORAL | Status: DC
  Administered 2020-02-04 – 2020-02-15 (×12): 220 mg via ORAL
  Filled 2020-02-04 (×12): qty 1

## 2020-02-04 MED ORDER — SODIUM CHLORIDE 0.9 % IV SOLN
100.0000 mg | INTRAVENOUS | Status: AC
  Administered 2020-02-04 (×2): 100 mg via INTRAVENOUS
  Filled 2020-02-04 (×2): qty 20

## 2020-02-04 MED ORDER — SODIUM CHLORIDE 0.9 % IV SOLN: INTRAVENOUS | Status: AC

## 2020-02-04 MED ORDER — ACETAMINOPHEN 325 MG PO TABS
650.0000 mg | ORAL_TABLET | Freq: Four times a day (QID) | ORAL | Status: DC | PRN
  Administered 2020-02-11: 650 mg via ORAL
  Filled 2020-02-04: qty 2

## 2020-02-04 MED ORDER — SODIUM CHLORIDE 0.9 % IV SOLN
1000.0000 mL | INTRAVENOUS | Status: DC
  Administered 2020-02-04: 1000 mL via INTRAVENOUS

## 2020-02-04 MED ORDER — ASCORBIC ACID 500 MG PO TABS
500.0000 mg | ORAL_TABLET | Freq: Every day | ORAL | Status: DC
  Administered 2020-02-04 – 2020-02-15 (×12): 500 mg via ORAL
  Filled 2020-02-04 (×12): qty 1

## 2020-02-04 MED ORDER — VITAMIN D 25 MCG (1000 UNIT) PO TABS
1000.0000 [IU] | ORAL_TABLET | Freq: Every day | ORAL | Status: DC
  Administered 2020-02-04 – 2020-02-15 (×12): 1000 [IU] via ORAL
  Filled 2020-02-04 (×12): qty 1

## 2020-02-04 NOTE — ED Notes (Signed)
Pt vomiting in room. 

## 2020-02-04 NOTE — ED Provider Notes (Signed)
[facility] EMERGENCY DEPARTMENT Provider Note   CSN: [id number] Arrival date & time: 02/04/20  0134   Time seen 3:00 AM  History Chief Complaint  Patient presents with  . Marland KitchenCOVID+  . Shortness of Breath    Melinda Moore is a 49 y.o. female.  HPI   Patient states her symptoms started about 1 week ago.  She was seen at urgent care on Monday, August 23 at urgent care and had positive Covid test.  She states she has felt short of breath since her symptoms started.  She has body aches, scratchy throat, a cough off and on sometimes producing cloudy mucus.  Mild rhinorrhea.  She denies chest pain and nausea or diarrhea.  She has had a few episodes of posttussive vomiting.  She denies abdominal pain other than soreness from coughing.  She states she felt like her breathing got a little bit worse tonight but mainly she states she gets better and worse and she wanted to be evaluated.  She denies diabetes or hypertension.  She is noted to be on hydromorphone pills and she states she had total knee replacement done in February and she is on pain pills for that.  However when I review her [place] database she has been consistently on narcotics for at least 2 years.  Patient states she was exposed when her daughter and her family all had Covid and they walked their keys in the car.  Her other child pick them up in the patient's car and then patient got in the car after them.  She states she did not have any other direct exposure.  Her daughter and her family are all doing well now.  Patient denies taking the Covid vaccine.  PCP Babs Sciara, MD   Past Medical History:  Diagnosis Date  . Anemia   . Anxiety   . Bell's palsy   . Depression   . DVT (deep venous thrombosis) (HCC) 09/02/2012   [place] on 02/13/2012 showed acute DVT in left calf veins and posterior tibial veins  . Fibromyalgia   . GERD (gastroesophageal reflux disease)   . History of blood transfusion 2000  . History of trigeminal  neuralgia   . HTN (hypertension)    patient denies was taking a blood pressure medication in early 2000 but it was migraines  . Leukopenia   . MS (multiple sclerosis) (HCC) 1997  . Neuropathic pain   . Peripheral edema   . Port catheter in place 12/31/2012  . Right bundle branch block     Patient Active Problem List   Diagnosis Date Noted  . Patellofemoral arthritis of right knee 07/14/2019  . History of DVT in adulthood 06/23/2019  . Right leg pain 03/12/2017  . Gait abnormality 03/12/2017  . Radiculopathy due to lumbar intervertebral disc disorder 09/21/2016  . Spondylosis without myelopathy or radiculopathy, lumbar region 09/21/2016  . History of anxiety 09/08/2016  . Primary osteoarthritis of right knee 08/30/2016  . Primary insomnia 08/30/2016  . Myofascial pain 08/30/2016  . Sciatica, right side 12/12/2015  . Right knee pain 12/12/2015  . Morbid obesity (HCC) 08/12/2015  . Lumbago 02/10/2014  . Chronic anxiety 07/08/2013  . Swelling of breast 04/08/2013  . Chronic pain syndrome 03/11/2013  . Port catheter in place 12/31/2012  . Unspecified constipation 10/01/2012  . Anemia 10/01/2012  . Rectal bleeding 10/01/2012  . Elevated alkaline phosphatase level 10/01/2012  . MS (multiple sclerosis) (HCC) 09/02/2012  . DVT (deep venous thrombosis) (HCC)  09/02/2012  . ANEMIA 04/12/2010  . NAUSEA WITH VOMITING 04/12/2010  . OTHER DYSPHAGIA 04/12/2010    Past Surgical History:  Procedure Laterality Date  . ABDOMINAL HYSTERECTOMY    . CHOLECYSTECTOMY    . ESOPHAGOGASTRODUODENOSCOPY  2011   Dr. Jena Gauss. Possible cervical esophageal with status post disruption with passage of Maloney dilator, glycol esophageal erythema, antral erosions. Biopsy from the stomach revealed minimal chronic inactive inflammation but negative for H. pylori  . LAPAROSCOPIC GASTRIC SLEEVE RESECTION N/A 04/17/2016   Procedure: LAPAROSCOPIC GASTRIC SLEEVE RESECTION WITH UPPER ENDOSCOPY;  Surgeon: Glenna Fellows, MD;  Location: [facility] ORS;  Service: General;  Laterality: N/A;  . PATELLA-FEMORAL ARTHROPLASTY Right 07/14/2019   Procedure: PATELLA-FEMORAL ARTHROPLASTY;  Surgeon: Teryl Lucy, MD;  Location: [facility] ORS;  Service: Orthopedics;  Laterality: Right;  . PORTACATH PLACEMENT    . TUBAL LIGATION  2001     OB History   No obstetric history on file.     Family History  Problem Relation Age of Onset  . Heart disease Father   . Hyperlipidemia Father   . Congestive Heart Failure Father   . Hypertension Sister   . Other Paternal Uncle        Possible colon cancer, age greater than 77  . Cirrhosis Brother        etoh  . Healthy Mother   . Healthy Daughter   . Healthy Daughter     Social History   Tobacco Use  . Smoking status: Former Smoker    Types: Cigarettes    Quit date: 1990    Years since quitting: 31.6  . Smokeless tobacco: Never Used  Vaping Use  . Vaping Use: Never used  Substance Use Topics  . Alcohol use: No  . Drug use: No    Home Medications Prior to Admission medications   Medication Sig Start Date End Date Taking? Authorizing Provider  baclofen (LIORESAL) 10 MG tablet Take 1 tablet (10 mg total) by mouth 3 (three) times daily. As needed for muscle spasm 07/14/19   Janine Ores K, PA-C  benzonatate (TESSALON) 100 MG capsule Take 1 capsule (100 mg total) by mouth every 8 (eight) hours. 02/01/20   Avegno, Zachery Dakins, FNP  Bioflavonoid Products (BIOFLEX) TABS Take 2 tablets by mouth daily.    [provider]  buPROPion (WELLBUTRIN SR) 150 MG 12 hr tablet TAKE ONE (1) TABLET BY MOUTH 3 TIMES DAILY 12/29/19   Babs Sciara, MD  carbamazepine (TEGRETOL XR) 200 MG 12 hr tablet Take 1 tablet (200 mg total) by mouth 3 (three) times daily as needed. 09/08/19   Babs Sciara, MD  cetirizine (ZYRTEC ALLERGY) 10 MG tablet Take 1 tablet (10 mg total) by mouth daily. 02/01/20   Avegno, Zachery Dakins, FNP  Cholecalciferol (VITAMIN D3) 125 MCG (5000 UT) TABS Take 20,000  Units by mouth 2 (two) times daily.    [provider]  Fingolimod HCl (GILENYA) 0.5 MG CAPS Take 0.5 mg by mouth daily.     [provider]  fluticasone (FLONASE) 50 MCG/ACT nasal spray Place 1 spray into both nostrils daily for 14 days. 02/01/20 02/15/20  Avegno, Zachery Dakins, FNP  gabapentin (NEURONTIN) 300 MG capsule Take 1 capsule (300 mg total) by mouth 3 (three) times daily. Patient taking differently: Take 1,200 mg by mouth 3 (three) times daily. States 300mg  tabs-  Takes 4 tab in am, 4 at lunch, 4 at bedtime 05/24/15   Luking, 05/26/15, MD  HYDROmorphone (DILAUDID) 4  MG tablet Take one tablet po q 4 hrs prn severe pain. No more than 4.5 tablets per day 01/25/20   Babs Sciara, MD  HYDROmorphone (DILAUDID) 4 MG tablet Take one tablet po q 4 hrs prn severe pain. No more than 4.5 tablets per day 01/25/20   Babs Sciara, MD  HYDROmorphone (DILAUDID) 4 MG tablet Take one tablet po q 4 hrs prn severe pain. No more than 4.5 tablets per day 01/25/20   Babs Sciara, MD  LUTEIN PO Take 1 tablet by mouth daily.    [provider]  methylphenidate (RITALIN) 10 MG tablet Take 1 tablet (10 mg total) by mouth 2 (two) times daily. Patient taking differently: Take 10 mg by mouth 2 (two) times daily as needed (attention/energy).  03/20/19   Babs Sciara, MD  Multiple Vitamin (MULTIVITAMIN WITH MINERALS) TABS tablet Take 1 tablet by mouth daily. ALIVE    [provider]  Multiple Vitamins-Minerals (EMERGEN-C IMMUNE) PACK Take 1 packet by mouth once a week.     [provider]  naloxone [facility]) nasal spray 4 mg/0.1 mL Use as directed if accidental overdose 01/25/20   Babs Sciara, MD  ondansetron (ZOFRAN) 4 MG tablet Take 1 tablet (4 mg total) by mouth every 6 (six) hours. Patient taking differently: Take 4 mg by mouth every 6 (six) hours as needed (nausea).  01/31/19   Triplett, Tammy, PA-C  ondansetron (ZOFRAN) 4 MG tablet Take 1 tablet (4 mg total) by mouth  every 8 (eight) hours as needed for nausea or vomiting. 07/14/19   Janine Ores K, PA-C  ondansetron (ZOFRAN-ODT) 4 MG disintegrating tablet Take 1 tablet (4 mg total) by mouth every 8 (eight) hours as needed for nausea or vomiting. 02/01/20   Avegno, Zachery Dakins, FNP  pantoprazole (PROTONIX) 40 MG tablet Take 40 mg by mouth daily. 07/08/19   [provider]  Polyethylene Glycol 400 (BLINK TEARS) 0.25 % SOLN Place 1 drop into the right eye 2 (two) times daily as needed (dry eyes).     [provider]  potassium chloride SA (K-DUR) 20 MEQ tablet TAKE ONE TABLET BY MOUTH TWICE A DAY Patient taking differently: Take 20 mEq by mouth daily.  01/19/19   Babs Sciara, MD  predniSONE (DELTASONE) 10 MG tablet Take 2 tablets (20 mg total) by mouth daily. 02/01/20   Avegno, Zachery Dakins, FNP  sertraline (ZOLOFT) 50 MG tablet Take 0.5 tablets (25 mg total) by mouth 2 (two) times daily. 11/04/19   Babs Sciara, MD  torsemide (DEMADEX) 20 MG tablet TAKE TWO TABLETS BY MOUTH IN THE MORNINGAND ONE TABLET AT NOON IF NEEDED Patient taking differently: Take 20 mg by mouth daily.  11/21/18   Babs Sciara, MD    Allergies    Ambien [zolpidem tartrate] and Tape  Review of Systems   Review of Systems  All other systems reviewed and are negative.   Physical Exam Updated Vital Signs BP 125/82   Pulse (!) 108   Temp (!) 100.7 F (38.2 C) (Oral)   Resp (!) 37   Ht 5\' 8"  (1.727 m)   Wt 113.3 kg   SpO2 94%   BMI 37.98 kg/m   Physical Exam Vitals and nursing note reviewed.  Constitutional:      General: She is in acute distress.     Appearance: Normal appearance. She is obese. She is not ill-appearing or toxic-appearing.  HENT:     Head: Normocephalic and  atraumatic.     Right Ear: External ear normal.     Left Ear: External ear normal.  Eyes:     Extraocular Movements: Extraocular movements intact.     Conjunctiva/sclera: Conjunctivae normal.     Pupils: Pupils are equal, round, and  reactive to light.  Cardiovascular:     Rate and Rhythm: Regular rhythm. Tachycardia present.  Pulmonary:     Effort: Tachypnea, accessory muscle usage and prolonged expiration present.     Breath sounds: Decreased air movement present. Examination of the right-lower field reveals rales. Examination of the left-lower field reveals rales. Rales present.  Musculoskeletal:     Cervical back: Normal range of motion and neck supple.  Neurological:     Mental Status: She is alert.     ED Results / Procedures / Treatments   Labs (all labs ordered are listed, but only abnormal results are displayed) Results for orders placed or performed during the hospital encounter of 02/04/20  Lactic acid, plasma  Result Value Ref Range   Lactic Acid, Venous 1.6 0.5 - 1.9 mmol/L  CBC WITH DIFFERENTIAL  Result Value Ref Range   WBC 5.2 4.0 - 10.5 K/uL   RBC 4.38 3.87 - 5.11 MIL/uL   Hemoglobin 11.6 (L) 12.0 - 15.0 g/dL   HCT 40.9 36 - 46 %   MCV 84.9 80.0 - 100.0 fL   MCH 26.5 26.0 - 34.0 pg   MCHC 31.2 30.0 - 36.0 g/dL   RDW 81.1 91.4 - 78.2 %   Platelets 307 150 - 400 K/uL   nRBC 0.0 0.0 - 0.2 %   Neutrophils Relative % 87 %   Neutro Abs 4.6 1.7 - 7.7 K/uL   Lymphocytes Relative 7 %   Lymphs Abs 0.4 (L) 0.7 - 4.0 K/uL   Monocytes Relative 5 %   Monocytes Absolute 0.3 0 - 1 K/uL   Eosinophils Relative 0 %   Eosinophils Absolute 0.0 0 - 0 K/uL   Basophils Relative 0 %   Basophils Absolute 0.0 0 - 0 K/uL   Immature Granulocytes 1 %   Abs Immature Granulocytes 0.05 0.00 - 0.07 K/uL  Comprehensive metabolic panel  Result Value Ref Range   Sodium 137 135 - 145 mmol/L   Potassium 3.5 3.5 - 5.1 mmol/L   Chloride 98 98 - 111 mmol/L   CO2 24 22 - 32 mmol/L   Glucose, Bld 113 (H) 70 - 99 mg/dL   BUN 16 6 - 20 mg/dL   Creatinine, Ser 9.56 (H) 0.44 - 1.00 mg/dL   Calcium 8.6 (L) 8.9 - 10.3 mg/dL   Total Protein 8.2 (H) 6.5 - 8.1 g/dL   Albumin 3.5 3.5 - 5.0 g/dL   AST 213 (H) 15 - 41 U/L   ALT  74 (H) 0 - 44 U/L   Alkaline Phosphatase 132 (H) 38 - 126 U/L   Total Bilirubin 0.7 0.3 - 1.2 mg/dL   GFR calc non Af Amer >60 >60 mL/min   GFR calc Af Amer >60 >60 mL/min   Anion gap 15 5 - 15  D-dimer, quantitative  Result Value Ref Range   D-Dimer, Quant 1.62 (H) 0.00 - 0.50 ug/mL-FEU  Procalcitonin  Result Value Ref Range   Procalcitonin <0.10 ng/mL  Lactate dehydrogenase  Result Value Ref Range   LDH 361 (H) 98 - 192 U/L  Ferritin  Result Value Ref Range   Ferritin 233 11 - 307 ng/mL  Triglycerides  Result Value Ref Range  Triglycerides 127 <150 mg/dL  Fibrinogen  Result Value Ref Range   Fibrinogen 762 (H) 210 - 475 mg/dL  C-reactive protein  Result Value Ref Range   CRP 17.4 (H) <1.0 mg/dL    Specimen Information: Nasopharyngeal Swab; Nasopharyngeal(NP) swabs in vial transport medium   Nasopharynge      0 Result Notes  Ref Range & Units 3 d ago  SARS-CoV-2, NAA Not Detected DetectedAbnormal           Laboratory interpretation all normal except mild anemia, elevation of LFTs, expected elevation of inflammatory markers.   EKG EKG Interpretation  Date/Time:  Thursday February 04 2020 01:38:11 EDT Ventricular Rate:  109 PR Interval:    QRS Duration: 134 QT Interval:  352 QTC Calculation: 474 R Axis:   41 Text Interpretation: Sinus tachycardia Right bundle branch block Since last tracing rate faster 26 Oct 2015 Confirmed by Devoria Albe ([id number]) on 02/04/2020 2:42:42 AM   Radiology DG Chest Portable 1 View  Result Date: 02/04/2020 CLINICAL DATA:  49 year old female with positive COVID-19. EXAM: PORTABLE CHEST 1 VIEW COMPARISON:  Chest radiograph dated 10/25/2015. FINDINGS: Port-A-Cath with tip in similar position at the cavoatrial junction. Left mid to lower lung field hazy airspace opacities as well as minimal right lung base densities may represent developing infiltrate. Clinical correlation is recommended. No large pleural effusion. There is no  pneumothorax. Stable cardiac silhouette. No acute osseous pathology. IMPRESSION: Findings may represent developing infiltrate in the left mid to lower lung field and right lung base. Electronically Signed   By: Elgie Collard M.D.   On: 02/04/2020 02:11    Procedures Procedures (including critical care time)  Medications Ordered in ED Medications  0.9 %  sodium chloride infusion (1,000 mLs Intravenous New Bag/Given 02/04/20 0224)    ED Course  I have reviewed the triage vital signs and the nursing notes.  Pertinent labs & imaging results that were available during my care of the patient were reviewed by me and considered in my medical decision making (see chart for details).    MDM Rules/Calculators/A&P                          Patient presents with known exposure to Covid and having a positive Covid test.  She presents hypoxic with pulse ox 86% on room air.  She also had low-grade temp.  She was placed on 6 L nasal cannula oxygen and her pulse ox is in the mid 90s, but still remains with some tachypnea.  We discussed admission and she is agreeable.  Review of the [place] shows patient was getting monthly narcotic pills for at least the past 2 years, she was getting 135 tablets of hydromorphone 4 mg tablets monthly most recently however August 16 that was dropped down to 130 tablets.  Looking at her primary care note he had talked about tapering her off slowly every month by 5 tablets.  03:53 AM Dr Loney Loh, hospitalist will admit.  Melinda Moore was evaluated in Emergency Department on 02/04/2020 for the symptoms described in the history of present illness. She was evaluated in the context of the global COVID-19 pandemic, which necessitated consideration that the patient might be at risk for infection with the SARS-CoV-2 virus that causes COVID-19. Institutional protocols and algorithms that pertain to the evaluation of patients at risk for COVID-19 are in a state of rapid  change based on information released by regulatory bodies including the  CDC and federal and [employer]. These policies and algorithms were followed during the patient's care in the ED.   Final Clinical Impression(s) / ED Diagnoses Final diagnoses:  Hypoxia  Pneumonia due to COVID-19 virus    Rx / DC Orders  Plan admission  Devoria Albe, MD, Concha Pyo, MD 02/04/20 [phone]

## 2020-02-04 NOTE — Progress Notes (Signed)
**Note De-Identified  Obfuscation** IS and Flutter not started at this time due to increased aerosol production

## 2020-02-04 NOTE — Progress Notes (Signed)
Per HPI: Melinda Moore is a 49 y.o. female with medical history significant of MS, hypertension, anxiety, depression, Bell's palsy, DVT in 2013, fibromyalgia, trigeminal neuralgia presenting with complaints of shortness of breath after recently testing positive for Covid on 8/23 during an urgent care visit.  Patient reports 5-day history of shortness of breath, fatigue, nausea and poor oral intake.  Denies fevers, cough, or chest pain.  States she has had some slight vomiting at home but is mostly feeling nauseous and has lost her appetite.  She experienced some epigastric abdominal discomfort when she vomited but denies abdominal pain otherwise.  Denies diarrhea.  She has not been vaccinated against Covid.  8/26: Patient has been admitted for acute hypoxemic respiratory failure in the setting of COVID-19 pneumonia. Oxygen supplementation requirements have increased since admission up to 8 L this morning. She does not appear to be quite short of breath however. Patient has been started on Solu-Medrol as well as remdesivir. Plan to add bariticinib today in order to assist further with treatment given high O2 requirements as well as increase in inflammatory markers. Procalcitonin low, therefore avoid antibiotics. Patient does have some nausea and vomiting likely related to Covid. Patient noted to have elevated alkaline phosphatase as well as GGT and minimal increase in lipase. Plan for right upper quadrant ultrasound today and maintain on clear liquids as well as IV fluid for now. Spoke with daughter Dizdarevic [phone].  Total care time: 35 minutes.

## 2020-02-04 NOTE — H&P (Addendum)
History and Physical    Melinda Moore RKY:[id number] DOB: [birth year] DOA: 02/04/2020  PCP: Kathyrn Drown, MD Patient coming from: Home  Chief Complaint: Shortness of breath  HPI: Melinda Moore is a 49 y.o. female with medical history significant of MS, hypertension, anxiety, depression, Bell's palsy, DVT in 2013, fibromyalgia, trigeminal neuralgia presenting with complaints of shortness of breath after recently testing positive for Covid on 8/23 during an urgent care visit.  Patient reports 5-day history of shortness of breath, fatigue, nausea and poor oral intake.  Denies fevers, cough, or chest pain.  States she has had some slight vomiting at home but is mostly feeling nauseous and has lost her appetite.  She experienced some epigastric abdominal discomfort when she vomited but denies abdominal pain otherwise.  Denies diarrhea.  She has not been vaccinated against Covid.  ED Course: Temperature 100.7 F.  Tachycardic and tachypneic.  Oxygen saturation 86% on room air, improved with 5 L supplemental oxygen.  Labs showing no leukocytosis.  Hemoglobin 11.6, at baseline.  Platelet count normal.  No significant electrolyte derangements and renal function at baseline.  Transaminases elevated (AST 100, ALT 74).  Alk phos mildly elevated at 132.  T bili normal.  Lactic acid normal x2.  Blood culture x2 pending.  Procalcitonin <0.10. Inflammatory markers elevated: D-dimer 1.62, LDH 361, ferritin 233, fibrinogen 762, and CRP 17.4.  Chest x-ray showing developing infiltrate in the left mid to lower lung field and right lung base.  Review of Systems:  All systems reviewed and apart from history of presenting illness, are negative.  Past Medical History:  Diagnosis Date  . Anemia   . Anxiety   . Bell's palsy   . Depression   . DVT (deep venous thrombosis) ([place]) 09/02/2012   [place] on 02/13/2012 showed acute DVT in left calf veins and posterior tibial veins  . Fibromyalgia   . GERD (gastroesophageal reflux  disease)   . History of blood transfusion 2000  . History of trigeminal neuralgia   . HTN (hypertension)    patient denies was taking a blood pressure medication in early 2000 but it was migraines  . Leukopenia   . MS (multiple sclerosis) ([place]) 1997  . Neuropathic pain   . Peripheral edema   . Port catheter in place 12/31/2012  . Right bundle branch block     Past Surgical History:  Procedure Laterality Date  . ABDOMINAL HYSTERECTOMY    . CHOLECYSTECTOMY    . ESOPHAGOGASTRODUODENOSCOPY  2011   Dr. Gala Romney. Possible cervical esophageal with status post disruption with passage of Maloney dilator, glycol esophageal erythema, antral erosions. Biopsy from the stomach revealed minimal chronic inactive inflammation but negative for H. pylori  . LAPAROSCOPIC GASTRIC SLEEVE RESECTION N/A 04/17/2016   Procedure: LAPAROSCOPIC GASTRIC SLEEVE RESECTION WITH UPPER ENDOSCOPY;  Surgeon: Excell Seltzer, MD;  Location: [facility];  Service: General;  Laterality: N/A;  . PATELLA-FEMORAL ARTHROPLASTY Right 07/14/2019   Procedure: PATELLA-FEMORAL ARTHROPLASTY;  Surgeon: Marchia Bond, MD;  Location: [facility];  Service: Orthopedics;  Laterality: Right;  . PORTACATH PLACEMENT    . TUBAL LIGATION  2001     reports that she quit smoking about 31 years ago. Her smoking use included cigarettes. She has never used smokeless tobacco. She reports that she does not drink alcohol and does not use drugs.  Allergies  Allergen Reactions  . Ambien [Zolpidem Tartrate]     nightmares  . Tape Itching and Rash    Family History  Problem  Relation Age of Onset  . Heart disease Father   . Hyperlipidemia Father   . Congestive Heart Failure Father   . Hypertension Sister   . Other Paternal Uncle        Possible colon cancer, age greater than 41  . Cirrhosis Brother        etoh  . Healthy Mother   . Healthy Daughter   . Healthy Daughter     Prior to Admission medications   Medication Sig Start Date End Date Taking?  Authorizing Provider  baclofen (LIORESAL) 10 MG tablet Take 1 tablet (10 mg total) by mouth 3 (three) times daily. As needed for muscle spasm 07/14/19   Merlene Pulling K, PA-C  benzonatate (TESSALON) 100 MG capsule Take 1 capsule (100 mg total) by mouth every 8 (eight) hours. 02/01/20   Avegno, Darrelyn Hillock, FNP  Bioflavonoid Products (BIOFLEX) TABS Take 2 tablets by mouth daily.    [provider]  buPROPion (WELLBUTRIN SR) 150 MG 12 hr tablet TAKE ONE (1) TABLET BY MOUTH 3 TIMES DAILY 12/29/19   Kathyrn Drown, MD  carbamazepine (TEGRETOL XR) 200 MG 12 hr tablet Take 1 tablet (200 mg total) by mouth 3 (three) times daily as needed. 09/08/19   Kathyrn Drown, MD  cetirizine (ZYRTEC ALLERGY) 10 MG tablet Take 1 tablet (10 mg total) by mouth daily. 02/01/20   Avegno, Darrelyn Hillock, FNP  Cholecalciferol (VITAMIN D3) 125 MCG (5000 UT) TABS Take 20,000 Units by mouth 2 (two) times daily.    [provider]  Fingolimod HCl (GILENYA) 0.5 MG CAPS Take 0.5 mg by mouth daily.     [provider]  fluticasone (FLONASE) 50 MCG/ACT nasal spray Place 1 spray into both nostrils daily for 14 days. 02/01/20 02/15/20  Avegno, Darrelyn Hillock, FNP  gabapentin (NEURONTIN) 300 MG capsule Take 1 capsule (300 mg total) by mouth 3 (three) times daily. Patient taking differently: Take 1,200 mg by mouth 3 (three) times daily. States 337m tabs-  Takes 4 tab in am, 4 at lunch, 4 at bedtime 05/24/15   Luking, Scott A, MD  HYDROmorphone (DILAUDID) 4 MG tablet Take one tablet po q 4 hrs prn severe pain. No more than 4.5 tablets per day 01/25/20   LKathyrn Drown MD  HYDROmorphone (DILAUDID) 4 MG tablet Take one tablet po q 4 hrs prn severe pain. No more than 4.5 tablets per day 01/25/20   LKathyrn Drown MD  HYDROmorphone (DILAUDID) 4 MG tablet Take one tablet po q 4 hrs prn severe pain. No more than 4.5 tablets per day 01/25/20   LKathyrn Drown MD  LUTEIN PO Take 1 tablet by mouth daily.    [provider]    methylphenidate (RITALIN) 10 MG tablet Take 1 tablet (10 mg total) by mouth 2 (two) times daily. Patient taking differently: Take 10 mg by mouth 2 (two) times daily as needed (attention/energy).  03/20/19   LKathyrn Drown MD  Multiple Vitamin (MULTIVITAMIN WITH MINERALS) TABS tablet Take 1 tablet by mouth daily. ALIVE    [provider]  Multiple Vitamins-Minerals (EMERGEN-C IMMUNE) PACK Take 1 packet by mouth once a week.     [provider]  naloxone ([facility] nasal spray 4 mg/0.1 mL Use as directed if accidental overdose 01/25/20   LKathyrn Drown MD  ondansetron (ZOFRAN) 4 MG tablet Take 1 tablet (4 mg total) by mouth every 6 (six) hours. Patient taking differently: Take 4 mg by mouth every  6 (six) hours as needed (nausea).  01/31/19   Triplett, Tammy, PA-C  ondansetron (ZOFRAN) 4 MG tablet Take 1 tablet (4 mg total) by mouth every 8 (eight) hours as needed for nausea or vomiting. 07/14/19   Merlene Pulling K, PA-C  ondansetron (ZOFRAN-ODT) 4 MG disintegrating tablet Take 1 tablet (4 mg total) by mouth every 8 (eight) hours as needed for nausea or vomiting. 02/01/20   Avegno, Darrelyn Hillock, FNP  pantoprazole (PROTONIX) 40 MG tablet Take 40 mg by mouth daily. 07/08/19   [provider]  Polyethylene Glycol 400 (BLINK TEARS) 0.25 % SOLN Place 1 drop into the right eye 2 (two) times daily as needed (dry eyes).     [provider]  potassium chloride SA (K-DUR) 20 MEQ tablet TAKE ONE TABLET BY MOUTH TWICE A DAY Patient taking differently: Take 20 mEq by mouth daily.  01/19/19   Kathyrn Drown, MD  predniSONE (DELTASONE) 10 MG tablet Take 2 tablets (20 mg total) by mouth daily. 02/01/20   Avegno, Darrelyn Hillock, FNP  sertraline (ZOLOFT) 50 MG tablet Take 0.5 tablets (25 mg total) by mouth 2 (two) times daily. 11/04/19   Kathyrn Drown, MD  torsemide (DEMADEX) 20 MG tablet TAKE TWO TABLETS BY MOUTH IN THE MORNINGAND ONE TABLET AT NOON IF NEEDED Patient taking differently: Take 20  mg by mouth daily.  11/21/18   Kathyrn Drown, MD    Physical Exam: Vitals:   02/04/20 0300 02/04/20 0330 02/04/20 0400 02/04/20 0430  BP: 128/77 130/80 109/86 124/79  Pulse: 98 (!) 102 (!) 103 (!) 102  Resp: 14 (!) 41 19 (!) 32  Temp:      TempSrc:      SpO2: 99% 97% 90% 94%  Weight:      Height:        Physical Exam Constitutional:      Appearance: She is ill-appearing.  HENT:     Head: Normocephalic and atraumatic.  Eyes:     Extraocular Movements: Extraocular movements intact.     Conjunctiva/sclera: Conjunctivae normal.  Cardiovascular:     Rate and Rhythm: Regular rhythm. Tachycardia present.     Pulses: Normal pulses.     Comments: Slightly tachycardic Pulmonary:     Breath sounds: Rales present. No wheezing.     Comments: Tachypneic with respiratory rate in the mid to high 20s Satting 89-90% on 7 L supplemental oxygen Abdominal:     General: Bowel sounds are normal. There is no distension.     Palpations: Abdomen is soft.     Tenderness: There is no abdominal tenderness. There is no guarding or rebound.  Musculoskeletal:        General: No swelling or tenderness.     Cervical back: Normal range of motion and neck supple.  Skin:    General: Skin is warm and dry.  Neurological:     General: No focal deficit present.     Mental Status: She is alert and oriented to person, place, and time.     Labs on Admission: I have personally reviewed following labs and imaging studies  CBC: Recent Labs  Lab 02/04/20 0120  WBC 5.2  NEUTROABS 4.6  HGB 11.6*  HCT 37.2  MCV 84.9  PLT 086   Basic Metabolic Panel: Recent Labs  Lab 02/04/20 0120  NA 137  K 3.5  CL 98  CO2 24  GLUCOSE 113*  BUN 16  CREATININE 1.01*  CALCIUM 8.6*   GFR: Estimated Creatinine  Clearance: 89 mL/min (A) (by C-G formula based on SCr of 1.01 mg/dL (H)). Liver Function Tests: Recent Labs  Lab 02/04/20 0120  AST 100*  ALT 74*  ALKPHOS 132*  BILITOT 0.7  PROT 8.2*  ALBUMIN 3.5    No results for input(s): LIPASE, AMYLASE in the last 168 hours. No results for input(s): AMMONIA in the last 168 hours. Coagulation Profile: No results for input(s): INR, PROTIME in the last 168 hours. Cardiac Enzymes: No results for input(s): CKTOTAL, CKMB, CKMBINDEX, TROPONINI in the last 168 hours. BNP (last 3 results) No results for input(s): PROBNP in the last 8760 hours. HbA1C: No results for input(s): HGBA1C in the last 72 hours. CBG: No results for input(s): GLUCAP in the last 168 hours. Lipid Profile: Recent Labs    02/04/20 0120  TRIG 127   Thyroid Function Tests: No results for input(s): TSH, T4TOTAL, FREET4, T3FREE, THYROIDAB in the last 72 hours. Anemia Panel: Recent Labs    02/04/20 0120  FERRITIN 233   Urine analysis:    Component Value Date/Time   COLORURINE AMBER (A) 01/31/2019 1350   APPEARANCEUR HAZY (A) 01/31/2019 1350   LABSPEC 1.011 01/31/2019 1350   PHURINE 5.0 01/31/2019 1350   GLUCOSEU NEGATIVE 01/31/2019 1350   HGBUR NEGATIVE 01/31/2019 1350   BILIRUBINUR NEGATIVE 01/31/2019 1350   KETONESUR NEGATIVE 01/31/2019 1350   PROTEINUR NEGATIVE 01/31/2019 1350   UROBILINOGEN 2.0 (H) 03/26/2011 1918   NITRITE POSITIVE (A) 01/31/2019 1350   LEUKOCYTESUR NEGATIVE 01/31/2019 1350    Radiological Exams on Admission: DG Chest Portable 1 View  Result Date: 02/04/2020 CLINICAL DATA:  49 year old female with positive COVID-19. EXAM: PORTABLE CHEST 1 VIEW COMPARISON:  Chest radiograph dated 10/25/2015. FINDINGS: Port-A-Cath with tip in similar position at the cavoatrial junction. Left mid to lower lung field hazy airspace opacities as well as minimal right lung base densities may represent developing infiltrate. Clinical correlation is recommended. No large pleural effusion. There is no pneumothorax. Stable cardiac silhouette. No acute osseous pathology. IMPRESSION: Findings may represent developing infiltrate in the left mid to lower lung field and right  lung base. Electronically Signed   By: Anner Crete M.D.   On: 02/04/2020 02:11    EKG: Independently reviewed.  Sinus tachycardia.  RBBB seen on prior tracing from 2017 as well.  Assessment/Plan Principal Problem:   Pneumonia due to COVID-19 virus Active Problems:   Elevated alkaline phosphatase level   Chronic pain syndrome   Acute hypoxemic respiratory failure (HCC)   Elevated transaminase level   Acute hypoxemic respiratory failure secondary to COVID-19 viral pneumonia: Tested positive for Covid on 02/01/2020.  Febrile, tachycardic, and tachypneic. Oxygen saturation 86% on room air.  Currently satting 89-90% on 7 L supplemental oxygen. Chest x-ray showing developing infiltrate in the left mid to lower lung field and right lung base.  Bacterial pneumonia less likely given no leukocytosis or procalcitonin elevation.  Inflammatory markers elevated: D-dimer 1.62, LDH 361, ferritin 233, fibrinogen 762, and CRP 17.4.  Lactic acid normal x2. -Remdesivir dosing per pharmacy -IV Solu-Medrol 0.5 mg/kg every 12 hours -Vitamin C, zinc, vitamin D -Antitussives as needed -Tylenol as needed -Daily CBC with differential, CMP, CRP, D-dimer -Encourage prone positioning -Incentive spirometry, flutter valve -Airborne and contact precautions -Continuous pulse ox -Supplemental oxygen as needed to keep oxygen saturation above 90% -Blood culture x2 pending -Stat ABG  COVID-19 viral gastroenteritis: Patient reports having nausea and a few episodes of vomiting at home.  Endorsing poor oral intake.  Transaminases elevated  in the setting of acute viral illness.  T bili normal.  Abdominal exam benign. -Gentle IV fluid hydration, antiemetic as needed.  Check lipase level.  Elevated transaminases: Likely due to COVID-19 viral infection. -Continue to monitor LFTs  Mildly elevated alkaline phosphatase level -Check GGT  Chronic pain syndrome -Resume home Dilaudid after pharmacy med rec is  done  Anxiety, depression -Resume home meds after pharmacy med rec is done  MS -Resume home medication after pharmacy med rec is done  DVT prophylaxis: Lovenox Code Status: Full code Family Communication: No family available at this time. Disposition Plan: Status is: Inpatient  Remains inpatient appropriate because:IV treatments appropriate due to intensity of illness or inability to take PO, Inpatient level of care appropriate due to severity of illness and New supplemental oxygen requirement   Dispo: The patient is from: Home              Anticipated d/c is to: Home              Anticipated d/c date is: > 3 days              Patient currently is not medically stable to d/c.  The medical decision making on this patient was of high complexity and the patient is at high risk for clinical deterioration, therefore this is a level 3 visit.  Shela Leff MD [facility]  If 7PM-7AM, please contact night-coverage [email or web address]  02/04/2020, 5:17 AM

## 2020-02-04 NOTE — ED Triage Notes (Signed)
Pt comes in via [facility] for COVID+. Was + for COVID Monday. Called EMS for SOB. 77% on room air put on NRB sat up to 99%, 84% on 2l.  Other vital signs normal

## 2020-02-04 NOTE — ED Notes (Signed)
Patient denies pain and is resting comfortably.  

## 2020-02-05 ENCOUNTER — Inpatient Hospital Stay (HOSPITAL_COMMUNITY): Payer: 59

## 2020-02-05 LAB — COMPREHENSIVE METABOLIC PANEL
ALT: 56 U/L — ABNORMAL HIGH (ref 0–44)
AST: 60 U/L — ABNORMAL HIGH (ref 15–41)
Albumin: 2.7 g/dL — ABNORMAL LOW (ref 3.5–5.0)
Alkaline Phosphatase: 103 U/L (ref 38–126)
Anion gap: 11 (ref 5–15)
BUN: 14 mg/dL (ref 6–20)
CO2: 24 mmol/L (ref 22–32)
Calcium: 8.4 mg/dL — ABNORMAL LOW (ref 8.9–10.3)
Chloride: 106 mmol/L (ref 98–111)
Creatinine, Ser: 0.66 mg/dL (ref 0.44–1.00)
GFR calc Af Amer: >60 mL/min (ref >60)
GFR calc non Af Amer: >60 mL/min (ref >60)
Glucose, Bld: 102 mg/dL — ABNORMAL HIGH (ref 70–99)
Potassium: 3.7 mmol/L (ref 3.5–5.1)
Sodium: 141 mmol/L (ref 135–145)
Total Bilirubin: 0.5 mg/dL (ref 0.3–1.2)
Total Protein: 6.7 g/dL (ref 6.5–8.1)

## 2020-02-05 LAB — CBC WITH DIFFERENTIAL/PLATELET
Abs Immature Granulocytes: 0.08 10*3/uL — ABNORMAL HIGH (ref 0.00–0.07)
Basophils Absolute: 0 10*3/uL (ref 0.0–0.1)
Basophils Relative: 0 %
Eosinophils Absolute: 0 10*3/uL (ref 0.0–0.5)
Eosinophils Relative: 0 %
HCT: 34.9 % — ABNORMAL LOW (ref 36.0–46.0)
Hemoglobin: 11 g/dL — ABNORMAL LOW (ref 12.0–15.0)
Immature Granulocytes: 1 %
Lymphocytes Relative: 12 %
Lymphs Abs: 0.7 10*3/uL (ref 0.7–4.0)
MCH: 26.4 pg (ref 26.0–34.0)
MCHC: 31.5 g/dL (ref 30.0–36.0)
MCV: 83.9 fL (ref 80.0–100.0)
Monocytes Absolute: 0.5 10*3/uL (ref 0.1–1.0)
Monocytes Relative: 9 %
Neutro Abs: 4.8 10*3/uL (ref 1.7–7.7)
Neutrophils Relative %: 78 %
Platelets: 332 10*3/uL (ref 150–400)
RBC: 4.16 MIL/uL (ref 3.87–5.11)
RDW: 14.5 % (ref 11.5–15.5)
WBC: 6.2 10*3/uL (ref 4.0–10.5)
nRBC: 0 % (ref 0.0–0.2)

## 2020-02-05 LAB — C-REACTIVE PROTEIN: CRP: 7.8 mg/dL — ABNORMAL HIGH (ref <1.0)

## 2020-02-05 LAB — D-DIMER, QUANTITATIVE: D-Dimer, Quant: 1.61 ug/mL-FEU — ABNORMAL HIGH (ref 0.00–0.50)

## 2020-02-05 MED ORDER — PROMETHAZINE HCL 25 MG/ML IJ SOLN
25.0000 mg | Freq: Four times a day (QID) | INTRAMUSCULAR | Status: DC | PRN
  Administered 2020-02-06 – 2020-02-13 (×5): 25 mg via INTRAVENOUS
  Filled 2020-02-05 (×5): qty 1

## 2020-02-05 MED ORDER — SERTRALINE HCL 50 MG PO TABS
25.0000 mg | ORAL_TABLET | Freq: Two times a day (BID) | ORAL | Status: DC
  Administered 2020-02-06 – 2020-02-15 (×19): 25 mg via ORAL
  Filled 2020-02-05 (×19): qty 1

## 2020-02-05 MED ORDER — PANTOPRAZOLE SODIUM 40 MG IV SOLR
40.0000 mg | INTRAVENOUS | Status: DC
  Administered 2020-02-05: 40 mg via INTRAVENOUS
  Filled 2020-02-05: qty 40

## 2020-02-05 MED ORDER — BACLOFEN 10 MG PO TABS
10.0000 mg | ORAL_TABLET | Freq: Three times a day (TID) | ORAL | Status: DC
  Administered 2020-02-05 – 2020-02-14 (×29): 10 mg via ORAL
  Filled 2020-02-05 (×31): qty 1

## 2020-02-05 MED ORDER — LORATADINE 10 MG PO TABS
10.0000 mg | ORAL_TABLET | Freq: Every day | ORAL | Status: DC
  Administered 2020-02-06 – 2020-02-15 (×10): 10 mg via ORAL
  Filled 2020-02-05 (×11): qty 1

## 2020-02-05 MED ORDER — ENOXAPARIN SODIUM 40 MG/0.4ML ~~LOC~~ SOLN
40.0000 mg | Freq: Two times a day (BID) | SUBCUTANEOUS | Status: DC
  Administered 2020-02-05 – 2020-02-15 (×21): 40 mg via SUBCUTANEOUS
  Filled 2020-02-05 (×21): qty 0.4

## 2020-02-05 MED ORDER — IOHEXOL 350 MG/ML SOLN
100.0000 mL | Freq: Once | INTRAVENOUS | Status: AC | PRN
  Administered 2020-02-05: 100 mL via INTRAVENOUS

## 2020-02-05 MED ORDER — POTASSIUM CHLORIDE IN NACL 20-0.9 MEQ/L-% IV SOLN
INTRAVENOUS | Status: DC
  Filled 2020-02-05 (×5): qty 1000

## 2020-02-05 MED ORDER — FINGOLIMOD HCL 0.5 MG PO CAPS: 0.5000 mg | ORAL_CAPSULE | Freq: Every day | ORAL | Status: DC

## 2020-02-05 MED ORDER — GABAPENTIN 300 MG PO CAPS
300.0000 mg | ORAL_CAPSULE | Freq: Three times a day (TID) | ORAL | Status: DC
  Administered 2020-02-05 – 2020-02-14 (×29): 300 mg via ORAL
  Filled 2020-02-05 (×32): qty 1

## 2020-02-05 MED ORDER — HYDROMORPHONE HCL 4 MG PO TABS
4.0000 mg | ORAL_TABLET | Freq: Four times a day (QID) | ORAL | Status: DC | PRN
  Administered 2020-02-05 – 2020-02-14 (×6): 4 mg via ORAL
  Filled 2020-02-05 (×2): qty 2
  Filled 2020-02-05 (×2): qty 1
  Filled 2020-02-05 (×2): qty 2

## 2020-02-05 MED ORDER — BUPROPION HCL ER (SR) 150 MG PO TB12
150.0000 mg | ORAL_TABLET | Freq: Two times a day (BID) | ORAL | Status: DC
  Administered 2020-02-06 – 2020-02-15 (×19): 150 mg via ORAL
  Filled 2020-02-05 (×22): qty 1

## 2020-02-05 MED ORDER — ONDANSETRON HCL 4 MG/2ML IJ SOLN
4.0000 mg | Freq: Four times a day (QID) | INTRAMUSCULAR | Status: DC
  Administered 2020-02-05 – 2020-02-14 (×32): 4 mg via INTRAVENOUS
  Filled 2020-02-05 (×34): qty 2

## 2020-02-05 MED ORDER — FLUTICASONE PROPIONATE 50 MCG/ACT NA SUSP
1.0000 | Freq: Every day | NASAL | Status: DC
  Administered 2020-02-06 – 2020-02-15 (×10): 1 via NASAL
  Filled 2020-02-05 (×2): qty 16

## 2020-02-05 NOTE — Progress Notes (Signed)
PROGRESS NOTE    Melinda Moore  JIR:[id number] DOB: 11-25-1970 DOA: 02/04/2020 PCP: Babs Sciara, MD   Brief Narrative:  Per HPI: Melinda Moore a 49 y.o.femalewith medical history significant ofMS, hypertension, anxiety, depression, Bell's palsy, DVT in 2013, fibromyalgia, trigeminal neuralgia presenting with complaints of shortness of breath after recently testing positive for Covid on 8/23 during an urgent care visit.Patient reports 5-day history of shortness of breath, fatigue,nausea and poor oral intake. Denies fevers, cough, or chest pain. States she has had some slight vomiting at home but is mostly feeling nauseous and has lost her appetite. She experienced some epigastric abdominal discomfort when she vomited but denies abdominal pain otherwise. Denies diarrhea. She has not been vaccinated against Covid.  8/26: Patient has been admitted for acute hypoxemic respiratory failure in the setting of COVID-19 pneumonia. Oxygen supplementation requirements have increased since admission up to 8 L this morning. She does not appear to be quite short of breath however. Patient has been started on Solu-Medrol as well as remdesivir. Plan to add bariticinib today in order to assist further with treatment given high O2 requirements as well as increase in inflammatory markers. Procalcitonin low, therefore avoid antibiotics. Patient does have some nausea and vomiting likely related to Covid. Patient noted to have elevated alkaline phosphatase as well as GGT and minimal increase in lipase. Plan for right upper quadrant ultrasound today and maintain on clear liquids as well as IV fluid for now. Spoke with daughter [place] [phone].  8/27: Patient continues to have worsening hypoxemia as well as elevated D-dimer.  Lovenox changed to twice daily.  CT chest angiogram with no sign of PE, but unfortunately there does appear to be a large pneumomediastinum.  Discussed case with CT surgery with barium  esophagram performed with no contrast extravasation noted.  Patient down to 5 L nasal cannula at this time and otherwise appears comfortable.  She is still having some trouble with nausea and vomiting intermittently.  Assessment & Plan:   Principal Problem:   Pneumonia due to COVID-19 virus Active Problems:   Elevated alkaline phosphatase level   Chronic pain syndrome   Acute hypoxemic respiratory failure (HCC)   Elevated transaminase level  Acute hypoxemic respiratory failure secondary to COVID-19 viral pneumonia: Tested positive for Covid on 02/01/2020.  Febrile, tachycardic, and tachypneic. Oxygen saturation 86% on room air.  Currently satting 89-90% on 7 L supplemental oxygen. Chest x-ray showing developing infiltrate in the left mid to lower lung field and right lung base.  Bacterial pneumonia less likely given no leukocytosis or procalcitonin elevation.  Inflammatory markers elevated: D-dimer 1.62, LDH 361, ferritin 233, fibrinogen 762, and CRP 17.4.  Lactic acid normal x2. -Remdesivir dosing per pharmacy -IV Solu-Medrol 0.5 mg/kg every 12 hours -Vitamin C, zinc, vitamin D -Antitussives as needed -Tylenol as needed -Daily CBC with differential, CMP, CRP, D-dimer -Encourage prone positioning -Incentive spirometry, flutter valve -Airborne and contact precautions -Continuous pulse ox -Supplemental oxygen as needed to keep oxygen saturation above 90% -Blood culture x2 negative for 1 day  COVID-19 viral gastroenteritis: Patient reports having nausea and a few episodes of vomiting at home.  Endorsing poor oral intake.  Transaminases elevated in the setting of acute viral illness.  T bili normal.  Abdominal exam benign. -Gentle IV fluid hydration, antiemetic will be scheduled every 6 hours for now  Large pneumomediastinum -Discussed case with CT surgery, esophagram and CT chest performed with no sign of extravasation noted -Clear liquid diet for now and advance  as tolerated -Start PPI  IV qdaily  Elevated transaminases: Likely due to COVID-19 viral infection. -Continue to monitor LFTs  Mildly elevated alkaline phosphatase level -GGT elevated, but no significant findings on right upper quadrant ultrasound aside from fatty liver disease  Chronic pain syndrome -Resume home Dilaudid as needed q6 hours for severe pain  Anxiety, depression -Resume home medications  MS -Resume home medications as appropriate   DVT prophylaxis:Lovenox BID Code Status: Full Family Communication: Discussed with daughter 8/26, 8/27 Disposition Plan:  Status is: Inpatient  Remains inpatient appropriate because:IV treatments appropriate due to intensity of illness or inability to take PO and Inpatient level of care appropriate due to severity of illness   Dispo: The patient is from: Home              Anticipated d/c is to: Home              Anticipated d/c date is: 2 days              Patient currently is not medically stable to d/c.  Consultants:   Discussed with CT Surgery 8/27  Procedures:   See below  Antimicrobials:  Anti-infectives (From admission, onward)   Start     Dose/Rate Route Frequency Ordered Stop   02/05/20 1000  remdesivir 100 mg in sodium chloride 0.9 % 100 mL IVPB        100 mg 200 mL/hr over 30 Minutes Intravenous Daily 02/04/20 0406 02/09/20 0959   02/04/20 0415  remdesivir 100 mg in sodium chloride 0.9 % 100 mL IVPB        100 mg 200 mL/hr over 30 Minutes Intravenous Every 30 min 02/04/20 0406 02/04/20 0522      Subjective: Patient seen and evaluated today with no new acute complaints or concerns. No acute concerns or events noted overnight.  She is still having some episodes of nausea and vomiting, but denies any dyspnea or chest pain or shortness of breath.  Objective: Vitals:   02/05/20 0830 02/05/20 0900 02/05/20 0930 02/05/20 1000  BP: 127/80 138/84 138/83 128/71  Pulse: 88 100 97 94  Resp: (!) 42 (!) 38 (!) 30 (!) 37  Temp:        TempSrc:      SpO2: 96% 97% (!) 87% 90%  Weight:      Height:        Intake/Output Summary (Last 24 hours) at 02/05/2020 1530 Last data filed at 02/05/2020 1133 Gross per 24 hour  Intake 96.26 ml  Output --  Net 96.26 ml   Filed Weights   02/04/20 0138  Weight: 113.3 kg    Examination:  General exam: Appears calm and comfortable  Respiratory system: Clear to auscultation. Respiratory effort normal.  Currently on 5 L nasal cannula oxygen. Cardiovascular system: S1 & S2 heard, RRR.  Gastrointestinal system: Abdomen is nondistended, soft and nontender. Central nervous system: Alert and oriented. No focal neurological deficits. Extremities: Symmetric 5 x 5 power. Skin: No rashes, lesions or ulcers Psychiatry: Judgement and insight appear normal. Mood & affect appropriate.     Data Reviewed: I have personally reviewed following labs and imaging studies  CBC: Recent Labs  Lab 02/04/20 0120 02/05/20 0555  WBC 5.2 6.2  NEUTROABS 4.6 4.8  HGB 11.6* 11.0*  HCT 37.2 34.9*  MCV 84.9 83.9  PLT 307 332   Basic Metabolic Panel: Recent Labs  Lab 02/04/20 0120 02/05/20 0555  NA 137 141  K 3.5 3.7  CL 98  106  CO2 24 24  GLUCOSE 113* 102*  BUN 16 14  CREATININE 1.01* 0.66  CALCIUM 8.6* 8.4*   GFR: Estimated Creatinine Clearance: 112.4 mL/min (by C-G formula based on SCr of 0.66 mg/dL). Liver Function Tests: Recent Labs  Lab 02/04/20 0120 02/05/20 0555  AST 100* 60*  ALT 74* 56*  ALKPHOS 132* 103  BILITOT 0.7 0.5  PROT 8.2* 6.7  ALBUMIN 3.5 2.7*   Recent Labs  Lab 02/04/20 0327  LIPASE 126*   No results for input(s): AMMONIA in the last 168 hours. Coagulation Profile: No results for input(s): INR, PROTIME in the last 168 hours. Cardiac Enzymes: No results for input(s): CKTOTAL, CKMB, CKMBINDEX, TROPONINI in the last 168 hours. BNP (last 3 results) No results for input(s): PROBNP in the last 8760 hours. HbA1C: No results for input(s): HGBA1C in the  last 72 hours. CBG: No results for input(s): GLUCAP in the last 168 hours. Lipid Profile: Recent Labs    02/04/20 0120  TRIG 127   Thyroid Function Tests: No results for input(s): TSH, T4TOTAL, FREET4, T3FREE, THYROIDAB in the last 72 hours. Anemia Panel: Recent Labs    02/04/20 0120  FERRITIN 233   Sepsis Labs: Recent Labs  Lab 02/04/20 0120 02/04/20 0327  PROCALCITON <0.10  --   LATICACIDVEN 1.6 1.0    Recent Results (from the past 240 hour(s))  Novel Coronavirus, NAA ([facility])     Status: Abnormal   Collection Time: 02/01/20  8:49 AM   Specimen: Nasopharyngeal Swab; Nasopharyngeal(NP) swabs in vial transport medium   Nasopharynge  Result Value Ref Range Status   SARS-CoV-2, NAA Detected (A) Not Detected Final    Comment: Patients who have a positive COVID-19 test result may now have treatment options. Treatment options are available for patients with mild to moderate symptoms and for hospitalized patients. Visit our website at [email or web address] for resources and information. This nucleic acid amplification test was developed and its performance characteristics determined by [employer]. Nucleic acid amplification tests include RT-PCR and TMA. This test has not been FDA cleared or approved. This test has been authorized by FDA under an Emergency Use Authorization (EUA). This test is only authorized for the duration of time the declaration that circumstances exist justifying the authorization of the emergency use of in vitro diagnostic tests for detection of SARS-CoV-2 virus and/or diagnosis of COVID-19 infection under section 564(b)(1) of the Act, 21 U.S.C. 600KHT-9(H) (1), unless the authorization is terminated or revoked sooner. When diagnostic testing is negativ e, the possibility of a false negative result should be considered in the context of a patient's recent exposures and the presence of clinical signs and symptoms consistent with  COVID-19. An individual without symptoms of COVID-19 and who is not shedding SARS-CoV-2 virus would expect to have a negative (not detected) result in this assay.   SARS-COV-2, NAA 2 DAY TAT     Status: None   Collection Time: 02/01/20  8:49 AM   Nasopharynge  Result Value Ref Range Status   SARS-CoV-2, NAA 2 DAY TAT Performed  Final  Blood Culture (routine x 2)     Status: None (Preliminary result)   Collection Time: 02/04/20  1:20 AM   Specimen: Right Antecubital; Blood  Result Value Ref Range Status   Specimen Description RIGHT ANTECUBITAL  Final   Special Requests   Final    BOTTLES DRAWN AEROBIC AND ANAEROBIC Blood Culture adequate volume   Culture   Final    NO GROWTH 1 DAY  Performed at [facility], [address] [place], [place] [address]    Report Status PENDING  Incomplete  Blood Culture (routine x 2)     Status: None (Preliminary result)   Collection Time: 02/04/20  3:27 AM   Specimen: BLOOD LEFT ARM  Result Value Ref Range Status   Specimen Description BLOOD LEFT ARM  Final   Special Requests   Final    BOTTLES DRAWN AEROBIC AND ANAEROBIC Blood Culture adequate volume   Culture   Final    NO GROWTH 1 DAY Performed at [facility], [address], [place], [place] [address]    Report Status PENDING  Incomplete         Radiology Studies: CT CHEST WO CONTRAST  Result Date: 02/05/2020 CLINICAL DATA:  Pneumomediastinum on preceding CT angio chest, repeat CT imaging following water-soluble esophagram EXAM: CT CHEST WITHOUT CONTRAST TECHNIQUE: Multidetector CT imaging of the chest was performed following the standard protocol without IV contrast. Sagittal and coronal MPR images reconstructed from axial data set. COMPARISON:  Earlier CT angio chest 02/05/2020 FINDINGS: Cardiovascular: Aorta normal caliber. LEFT subclavian Port-A-Cath, tip SVC. No pericardial effusion. Minimal enlargement of cardiac chambers. Mediastinum/Nodes: Again identified pneumomediastinum extending  into base of cervical region. Esophagus contains a small amount of refluxed contrast from the known hiatal hernia. No contrast extravasation identified. Lungs/Pleura: Extensive patchy BILATERAL airspace infiltrates consistent with multifocal pneumonia. No pleural effusion or pneumothorax. Large bleb medial RIGHT lower lobe. Upper Abdomen: Prior gastric sleeve resection. Remaining visualized upper abdomen unremarkable. Musculoskeletal: No acute osseous findings. IMPRESSION: Again identified pneumomediastinum extending into base of cervical region. No contrast extravasation from the esophagus identified. Hiatal hernia. Extensive BILATERAL airspace infiltrates consistent with multifocal pneumonia. Prior gastric sleeve resection. Electronically Signed   By: Ulyses Southward M.D.   On: 02/05/2020 15:11   CT ANGIO CHEST PE W OR WO CONTRAST  Result Date: 02/05/2020 CLINICAL DATA:  Shortness of breath. COVID positive. Elevated D-dimer. EXAM: CT ANGIOGRAPHY CHEST WITH CONTRAST TECHNIQUE: Multidetector CT imaging of the chest was performed using the standard protocol during bolus administration of intravenous contrast. Multiplanar CT image reconstructions and MIPs were obtained to evaluate the vascular anatomy. CONTRAST:  OMNIPAQUE IOHEXOL 350 MG/ML SOLN COMPARISON:  No comparison studies available. FINDINGS: Cardiovascular: The heart size is normal. Trace pericardial effusion. Right Port-A-Cath tip is positioned in the upper right atrium. No evidence for filling defect in the opacified pulmonary arteries to suggest the presence of an acute pulmonary embolus Mediastinum/Nodes: Large volume pneumomediastinum evident. No mediastinal lymphadenopathy. There is no hilar lymphadenopathy. The esophagus has normal imaging features. Small hiatal hernia noted in this patient status post sleeve gastrectomy. There is no axillary lymphadenopathy. Lungs/Pleura: Patchy areas of ground-glass opacity are noted in the lungs bilaterally  with confluent regional ground-glass disease in the left upper, left lower, and basilar right lower lobes. No pleural effusion. Upper Abdomen: Small hypodensity in the anterior spleen is too small to characterize but likely benign. Musculoskeletal: No worrisome lytic or sclerotic osseous abnormality. Review of the MIP images confirms the above findings. IMPRESSION: 1. No CT evidence for acute pulmonary embolus. 2. Large volume pneumomediastinum, new since yesterday's chest x-ray. This is probably related to the underlying COVID lung disease. No obvious esophageal wall thickening or mediastinal fluid to suggest esophageal perforation. If esophageal injury is a concern, contrast esophagram could be used to further evaluate. 3. Patchy areas of ground-glass opacity in the lungs bilaterally with confluent regional ground-glass disease in the left upper,  left lower, and basilar right lower lobes. Imaging features compatible with multifocal pneumonia in this patient with a history of COVID-19. 4. Status post sleeve gastrectomy with small hiatal hernia. Electronically Signed   By: Kennith Center M.D.   On: 02/05/2020 09:15   DG Chest Portable 1 View  Result Date: 02/04/2020 CLINICAL DATA:  49 year old female with positive COVID-19. EXAM: PORTABLE CHEST 1 VIEW COMPARISON:  Chest radiograph dated 10/25/2015. FINDINGS: Port-A-Cath with tip in similar position at the cavoatrial junction. Left mid to lower lung field hazy airspace opacities as well as minimal right lung base densities may represent developing infiltrate. Clinical correlation is recommended. No large pleural effusion. There is no pneumothorax. Stable cardiac silhouette. No acute osseous pathology. IMPRESSION: Findings may represent developing infiltrate in the left mid to lower lung field and right lung base. Electronically Signed   By: Elgie Collard M.D.   On: 02/04/2020 02:11   US Abdomen Limited RUQ  Result Date: 02/04/2020 CLINICAL DATA:  Nausea  vomiting.  Abnormal LFTs. EXAM: ULTRASOUND ABDOMEN LIMITED RIGHT UPPER QUADRANT COMPARISON:  06/16/2007. FINDINGS: Gallbladder: Cholecystectomy. Common bile duct: Diameter: 3 mm Liver: Increased echogenicity consistent fatty infiltration or hepatocellular disease. No focal hepatic abnormality identified. Portal vein is patent on color Doppler imaging with normal direction of blood flow towards the liver. Other: None. IMPRESSION: 1.  Prior cholecystectomy.  No biliary distention. 2. Increased hepatic echogenicity consistent fatty infiltration or hepatocellular disease. No focal hepatic abnormality identified. Electronically Signed   By: Maisie Fus  Register   On: 02/04/2020 13:32   DG ESOPHAGUS W SINGLE CM (SOL OR THIN BA)  Result Date: 02/05/2020 CLINICAL DATA:  Pneumomediastinum on CT question esophageal perforation EXAM: ESOPHOGRAM/BARIUM SWALLOW TECHNIQUE: Single contrast examination was performed using water soluble contrast. FLUOROSCOPY TIME:  Fluoroscopy Time:  1 minutes 48 seconds Radiation Exposure Index (if provided by the fluoroscopic device): 49.5 mGy Number of Acquired Spot Images: multiple fluoroscopic screen captures COMPARISON:  CT angio chest 02/05/2020 FINDINGS: Patient swallowed Omnipaque 300 and imaging was performed. Study performed with patient supine, LPO, and RPO with head of bed elevated 20 degrees Normal esophageal motility. Small hiatal hernia. Prominent gastroesophageal reflux to the level of the clavicular heads. No contrast extravasation identified. No areas of esophageal wall irregularity, narrowing, or intraluminal filling defects seen. IMPRESSION: No esophageal injuries identified. Hiatal hernia with significant gastroesophageal reflux. Electronically Signed   By: Ulyses Southward M.D.   On: 02/05/2020 15:03        Scheduled Meds: . vitamin C  500 mg Oral Daily  . baricitinib  4 mg Oral Daily  . cholecalciferol  1,000 Units Oral Daily  . enoxaparin (LOVENOX) injection  40 mg  Subcutaneous Q12H  . methylPREDNISolone (SOLU-MEDROL) injection  55 mg Intravenous Q12H  . zinc sulfate  220 mg Oral Daily   Continuous Infusions: . 0.9 % NaCl with KCl 20 mEq / L 100 mL/hr at 02/05/20 0626  . remdesivir 100 mg in NS 100 mL Stopped (02/05/20 1113)     LOS: 1 day    Time spent: 35 minutes    Ruby Logiudice D Sherryll Burger, DO [facility]  If 7PM-7AM, please contact night-coverage [email or web address] 02/05/2020, 3:30 PM

## 2020-02-05 NOTE — ED Notes (Signed)
Pt unable to tolerate being prone. Pt O2 84% on LPM via HFNC, pt O2 increased to 10LPM via HFNC O2 increased to 95%

## 2020-02-05 NOTE — ED Notes (Signed)
Pt's O2 80% on 5LPM via N.[place]. Pt placed on HFNC at 6LPM and proned. O2 sat 94%.

## 2020-02-05 NOTE — ED Notes (Signed)
Pt too nauseated at the moment for dinner tray.

## 2020-02-06 DIAGNOSIS — U071 COVID-19: Secondary | ICD-10-CM | POA: Diagnosis present

## 2020-02-06 DIAGNOSIS — J9601 Acute respiratory failure with hypoxia: Secondary | ICD-10-CM | POA: Diagnosis present

## 2020-02-06 DIAGNOSIS — J9600 Acute respiratory failure, unspecified whether with hypoxia or hypercapnia: Secondary | ICD-10-CM | POA: Diagnosis present

## 2020-02-06 LAB — COMPREHENSIVE METABOLIC PANEL
ALT: 60 U/L — ABNORMAL HIGH (ref 0–44)
AST: 53 U/L — ABNORMAL HIGH (ref 15–41)
Albumin: 2.7 g/dL — ABNORMAL LOW (ref 3.5–5.0)
Alkaline Phosphatase: 106 U/L (ref 38–126)
Anion gap: 12 (ref 5–15)
BUN: 15 mg/dL (ref 6–20)
CO2: 24 mmol/L (ref 22–32)
Calcium: 8.4 mg/dL — ABNORMAL LOW (ref 8.9–10.3)
Chloride: 104 mmol/L (ref 98–111)
Creatinine, Ser: 0.59 mg/dL (ref 0.44–1.00)
GFR calc Af Amer: >60 mL/min (ref >60)
GFR calc non Af Amer: >60 mL/min (ref >60)
Glucose, Bld: 96 mg/dL (ref 70–99)
Potassium: 3.8 mmol/L (ref 3.5–5.1)
Sodium: 140 mmol/L (ref 135–145)
Total Bilirubin: 0.8 mg/dL (ref 0.3–1.2)
Total Protein: 6.8 g/dL (ref 6.5–8.1)

## 2020-02-06 LAB — CBC WITH DIFFERENTIAL/PLATELET
Abs Immature Granulocytes: 0.14 10*3/uL — ABNORMAL HIGH (ref 0.00–0.07)
Basophils Absolute: 0 10*3/uL (ref 0.0–0.1)
Basophils Relative: 0 %
Eosinophils Absolute: 0 10*3/uL (ref 0.0–0.5)
Eosinophils Relative: 0 %
HCT: 37.7 % (ref 36.0–46.0)
Hemoglobin: 11.5 g/dL — ABNORMAL LOW (ref 12.0–15.0)
Immature Granulocytes: 1 %
Lymphocytes Relative: 5 %
Lymphs Abs: 0.6 10*3/uL — ABNORMAL LOW (ref 0.7–4.0)
MCH: 26 pg (ref 26.0–34.0)
MCHC: 30.5 g/dL (ref 30.0–36.0)
MCV: 85.3 fL (ref 80.0–100.0)
Monocytes Absolute: 0.7 10*3/uL (ref 0.1–1.0)
Monocytes Relative: 6 %
Neutro Abs: 8.9 10*3/uL — ABNORMAL HIGH (ref 1.7–7.7)
Neutrophils Relative %: 88 %
Platelets: 410 10*3/uL — ABNORMAL HIGH (ref 150–400)
RBC: 4.42 MIL/uL (ref 3.87–5.11)
RDW: 14.6 % (ref 11.5–15.5)
WBC: 10.3 10*3/uL (ref 4.0–10.5)
nRBC: 0 % (ref 0.0–0.2)

## 2020-02-06 LAB — D-DIMER, QUANTITATIVE: D-Dimer, Quant: 1.57 ug/mL-FEU — ABNORMAL HIGH (ref 0.00–0.50)

## 2020-02-06 LAB — C-REACTIVE PROTEIN: CRP: 4.2 mg/dL — ABNORMAL HIGH (ref <1.0)

## 2020-02-06 MED ORDER — DM-GUAIFENESIN ER 30-600 MG PO TB12
1.0000 | ORAL_TABLET | Freq: Two times a day (BID) | ORAL | Status: DC
  Administered 2020-02-06 – 2020-02-15 (×19): 1 via ORAL
  Filled 2020-02-06 (×19): qty 1

## 2020-02-06 MED ORDER — GUAIFENESIN-DM 100-10 MG/5ML PO SYRP
5.0000 mL | ORAL_SOLUTION | ORAL | Status: DC | PRN
  Administered 2020-02-08: 5 mL via ORAL
  Filled 2020-02-06 (×3): qty 5

## 2020-02-06 MED ORDER — PANTOPRAZOLE SODIUM 40 MG IV SOLR
40.0000 mg | Freq: Two times a day (BID) | INTRAVENOUS | Status: DC
  Administered 2020-02-06 – 2020-02-08 (×5): 40 mg via INTRAVENOUS
  Filled 2020-02-06 (×5): qty 40

## 2020-02-06 MED ORDER — ALUM & MAG HYDROXIDE-SIMETH 200-200-20 MG/5ML PO SUSP
15.0000 mL | ORAL | Status: DC | PRN
  Administered 2020-02-06: 15 mL via ORAL
  Filled 2020-02-06: qty 30

## 2020-02-06 MED ORDER — ALBUTEROL SULFATE HFA 108 (90 BASE) MCG/ACT IN AERS
INHALATION_SPRAY | RESPIRATORY_TRACT | Status: AC
  Filled 2020-02-06: qty 6.7

## 2020-02-06 MED ORDER — ALBUTEROL SULFATE HFA 108 (90 BASE) MCG/ACT IN AERS
2.0000 | INHALATION_SPRAY | Freq: Once | RESPIRATORY_TRACT | Status: AC
  Administered 2020-02-06: 2 via RESPIRATORY_TRACT

## 2020-02-06 NOTE — Consult Note (Signed)
Referring Provider: No ref. provider found Primary Care Physician:  Babs Sciara, MD Primary Gastroenterologist:  Dr. Jena Gauss  Reason for Consultation: Elevated alkaline phosphatase and GGT  HPI: 49 year old lady with multiple medical problems including MS admitted to the hospital 8/26 with hypoxemic respiratory failure secondary to Covid pneumonia, nausea and vomiting..  Supplemental O2 up to 8 L needed at times.  Solu-Medrol,  baricitinib and remdesivir initiated.  CTA chest demonstrated no evidence of pulmonary embolus but large pneumomediastinum.  Barium swallow demonstrated no evidence of esophageal perforation but reflux up to the level of the clavicles. Alkaline phosphatase noted to be elevated to 132 along with AST and ALT of 174, respectively on admission.  GGT up at 235.  Today, AST and ALT 53/60, respectively and alkaline phosphatase 106.  Lipase mildly elevated at 126.  Total bilirubin persistently normal. Right upper quadrant ultrasound demonstrated echogenic liver.  No dilation of the biliary tree.  Gallbladder out. No baseline LFTs available. Patient denies any history of yellow jaundice or elevated LFTs in the past.  She takes multiple medications as outlined below.    Past Medical History:  Diagnosis Date  . Anemia   . Anxiety   . Bell's palsy   . Depression   . DVT (deep venous thrombosis) (HCC) 09/02/2012   [place] on 02/13/2012 showed acute DVT in left calf veins and posterior tibial veins  . Fibromyalgia   . GERD (gastroesophageal reflux disease)   . History of blood transfusion 2000  . History of trigeminal neuralgia   . HTN (hypertension)    patient denies was taking a blood pressure medication in early 2000 but it was migraines  . Leukopenia   . MS (multiple sclerosis) (HCC) 1997  . Neuropathic pain   . Peripheral edema   . Port catheter in place 12/31/2012  . Right bundle branch block     Past Surgical History:  Procedure Laterality Date  . ABDOMINAL  HYSTERECTOMY    . CHOLECYSTECTOMY    . ESOPHAGOGASTRODUODENOSCOPY  2011   Dr. Jena Gauss. Possible cervical esophageal with status post disruption with passage of Maloney dilator, glycol esophageal erythema, antral erosions. Biopsy from the stomach revealed minimal chronic inactive inflammation but negative for H. pylori  . LAPAROSCOPIC GASTRIC SLEEVE RESECTION N/A 04/17/2016   Procedure: LAPAROSCOPIC GASTRIC SLEEVE RESECTION WITH UPPER ENDOSCOPY;  Surgeon: Glenna Fellows, MD;  Location: [facility];  Service: General;  Laterality: N/A;  . PATELLA-FEMORAL ARTHROPLASTY Right 07/14/2019   Procedure: PATELLA-FEMORAL ARTHROPLASTY;  Surgeon: Teryl Lucy, MD;  Location: [facility];  Service: Orthopedics;  Laterality: Right;  . PORTACATH PLACEMENT    . TUBAL LIGATION  2001    Prior to Admission medications   Medication Sig Start Date End Date Taking? Authorizing Provider  baclofen (LIORESAL) 10 MG tablet Take 1 tablet (10 mg total) by mouth 3 (three) times daily. As needed for muscle spasm 07/14/19  Yes Janine Ores K, PA-C  benzonatate (TESSALON) 100 MG capsule Take 1 capsule (100 mg total) by mouth every 8 (eight) hours. 02/01/20  Yes Avegno, Zachery Dakins, FNP  Bioflavonoid Products (BIOFLEX) TABS Take 2 tablets by mouth daily.   Yes [provider]  buPROPion (WELLBUTRIN SR) 150 MG 12 hr tablet TAKE ONE (1) TABLET BY MOUTH 3 TIMES DAILY 12/29/19  Yes Luking, Jonna Coup, MD  carbamazepine (TEGRETOL XR) 200 MG 12 hr tablet Take 1 tablet (200 mg total) by mouth 3 (three) times daily as needed. 09/08/19  Yes Babs Sciara, MD  cetirizine (ZYRTEC ALLERGY) 10 MG tablet Take 1 tablet (10 mg total) by mouth daily. 02/01/20  Yes Avegno, Zachery Dakins, FNP  Cholecalciferol (VITAMIN D3) 125 MCG (5000 UT) TABS Take 20,000 Units by mouth 2 (two) times daily.   Yes [provider]  Fingolimod HCl (GILENYA) 0.5 MG CAPS Take 0.5 mg by mouth daily.    Yes [provider]  fluticasone (FLONASE) 50 MCG/ACT nasal  spray Place 1 spray into both nostrils daily for 14 days. 02/01/20 02/15/20 Yes Avegno, Zachery Dakins, FNP  gabapentin (NEURONTIN) 300 MG capsule Take 1 capsule (300 mg total) by mouth 3 (three) times daily. Patient taking differently: Take 1,200 mg by mouth 3 (three) times daily.  05/24/15  Yes Luking, Jonna Coup, MD  HYDROmorphone (DILAUDID) 4 MG tablet Take one tablet po q 4 hrs prn severe pain. No more than 4.5 tablets per day 01/25/20  Yes Luking, Scott A, MD  LUTEIN PO Take 1 tablet by mouth daily.   Yes [provider]  Multiple Vitamin (MULTIVITAMIN WITH MINERALS) TABS tablet Take 1 tablet by mouth daily. ALIVE   Yes [provider]  Multiple Vitamins-Minerals (EMERGEN-C IMMUNE) PACK Take 1 packet by mouth once a week.    Yes [provider]  naloxone [facility]) nasal spray 4 mg/0.1 mL Use as directed if accidental overdose 01/25/20  Yes Luking, Jonna Coup, MD  ondansetron (ZOFRAN-ODT) 4 MG disintegrating tablet Take 1 tablet (4 mg total) by mouth every 8 (eight) hours as needed for nausea or vomiting. 02/01/20  Yes Avegno, Zachery Dakins, FNP  pantoprazole (PROTONIX) 40 MG tablet Take 40 mg by mouth daily. 07/08/19  Yes [provider]  Polyethylene Glycol 400 (BLINK TEARS) 0.25 % SOLN Place 1 drop into the right eye 2 (two) times daily as needed (dry eyes).    Yes [provider]  potassium chloride SA (K-DUR) 20 MEQ tablet TAKE ONE TABLET BY MOUTH TWICE A DAY Patient taking differently: Take 20 mEq by mouth daily.  01/19/19  Yes Babs Sciara, MD  predniSONE (DELTASONE) 10 MG tablet Take 2 tablets (20 mg total) by mouth daily. 02/01/20  Yes Avegno, Zachery Dakins, FNP  sertraline (ZOLOFT) 50 MG tablet Take 0.5 tablets (25 mg total) by mouth 2 (two) times daily. 11/04/19  Yes Babs Sciara, MD  methylphenidate (RITALIN) 10 MG tablet Take 1 tablet (10 mg total) by mouth 2 (two) times daily. Patient not taking: Reported on 02/04/2020 03/20/19   Babs Sciara, MD    ondansetron (ZOFRAN) 4 MG tablet Take 1 tablet (4 mg total) by mouth every 6 (six) hours. Patient not taking: Reported on 02/04/2020 01/31/19   Triplett, Tammy, PA-C  ondansetron (ZOFRAN) 4 MG tablet Take 1 tablet (4 mg total) by mouth every 8 (eight) hours as needed for nausea or vomiting. Patient not taking: Reported on 02/04/2020 07/14/19   Armida Sans, PA-C  torsemide (DEMADEX) 20 MG tablet TAKE TWO TABLETS BY MOUTH IN THE [facility] ONE TABLET AT NOON IF NEEDED Patient not taking: Reported on 02/04/2020 11/21/18   Babs Sciara, MD    Current Facility-Administered Medications  Medication Dose Route Frequency Provider Last Rate Last Admin  . 0.9 % NaCl with KCl 20 mEq/ L  infusion   Intravenous Continuous Gery Pray, MD 100 mL/hr at 02/06/20 0439 New Bag at 02/06/20 0439  . acetaminophen (TYLENOL) tablet 650 mg  650 mg Oral Q6H PRN John Giovanni, MD      . albuterol (VENTOLIN  HFA) 108 (90 Base) MCG/ACT inhaler           . alum & mag hydroxide-simeth (MAALOX/MYLANTA) 200-200-20 MG/5ML suspension 15 mL  15 mL Oral Q4H PRN Sherryll Burger, Pratik D, DO   15 mL at 02/06/20 1230  . ascorbic acid (VITAMIN C) tablet 500 mg  500 mg Oral Daily John Giovanni, MD   500 mg at 02/06/20 0942  . baclofen (LIORESAL) tablet 10 mg  10 mg Oral TID Maurilio Lovely D, DO   10 mg at 02/06/20 6962  . baricitinib (OLUMIANT) tablet 4 mg  4 mg Oral Daily Sherryll Burger, Pratik D, DO   4 mg at 02/06/20 0942  . buPROPion [facility] SR) 12 hr tablet 150 mg  150 mg Oral BID Sherryll Burger, Pratik D, DO   150 mg at 02/06/20 0941  . cholecalciferol (VITAMIN D3) tablet 1,000 Units  1,000 Units Oral Daily John Giovanni, MD   1,000 Units at 02/06/20 [phone]  . enoxaparin (LOVENOX) injection 40 mg  40 mg Subcutaneous Q12H Shah, Pratik D, DO   40 mg at 02/06/20 0932  . Fingolimod HCl CAPS 0.5 mg  0.5 mg Oral Daily Sherryll Burger, Pratik D, DO      . fluticasone (FLONASE) 50 MCG/ACT nasal spray 1 spray  1 spray Each Nare Daily Sherryll Burger, Pratik D, DO   1  spray at 02/06/20 0943  . gabapentin (NEURONTIN) capsule 300 mg  300 mg Oral TID Maurilio Lovely D, DO   300 mg at 02/06/20 4132  . guaiFENesin-dextromethorphan (ROBITUSSIN DM) 100-10 MG/5ML syrup 5 mL  5 mL Oral Q4H PRN Sherryll Burger, Pratik D, DO      . HYDROmorphone (DILAUDID) tablet 4 mg  4 mg Oral Q6H PRN Sherryll Burger, Pratik D, DO   4 mg at 02/05/20 1611  . loratadine (CLARITIN) tablet 10 mg  10 mg Oral Daily Sherryll Burger, Pratik D, DO   10 mg at 02/06/20 0942  . methylPREDNISolone sodium succinate (SOLU-MEDROL) 125 mg/2 mL injection 55 mg  55 mg Intravenous Q12H John Giovanni, MD   55 mg at 02/06/20 0408  . ondansetron (ZOFRAN) injection 4 mg  4 mg Intravenous Q6H Shah, Pratik D, DO   4 mg at 02/06/20 1120  . pantoprazole (PROTONIX) injection 40 mg  40 mg Intravenous Q24H Sherryll Burger, Pratik D, DO   40 mg at 02/05/20 1808  . promethazine (PHENERGAN) injection 25 mg  25 mg Intravenous Q6H PRN Maurilio Lovely D, DO   25 mg at 02/06/20 4401  . remdesivir 100 mg in sodium chloride 0.9 % 100 mL IVPB  100 mg Intravenous Daily John Giovanni, MD   Paused at 02/06/20 1026  . sertraline (ZOLOFT) tablet 25 mg  25 mg Oral BID Maurilio Lovely D, DO   25 mg at 02/06/20 0272  . zinc sulfate capsule 220 mg  220 mg Oral Daily John Giovanni, MD   220 mg at 02/06/20 5366   Current Outpatient Medications  Medication Sig Dispense Refill  . baclofen (LIORESAL) 10 MG tablet Take 1 tablet (10 mg total) by mouth 3 (three) times daily. As needed for muscle spasm 50 tablet 0  . benzonatate (TESSALON) 100 MG capsule Take 1 capsule (100 mg total) by mouth every 8 (eight) hours. 30 capsule 0  . Bioflavonoid Products (BIOFLEX) TABS Take 2 tablets by mouth daily.    Marland Kitchen buPROPion (WELLBUTRIN SR) 150 MG 12 hr tablet TAKE ONE (1) TABLET BY MOUTH 3 TIMES DAILY 90 tablet 5  . carbamazepine (TEGRETOL XR) 200 MG 12  hr tablet Take 1 tablet (200 mg total) by mouth 3 (three) times daily as needed. 90 tablet 1  . cetirizine (ZYRTEC ALLERGY) 10 MG tablet Take  1 tablet (10 mg total) by mouth daily. 30 tablet 0  . Cholecalciferol (VITAMIN D3) 125 MCG (5000 UT) TABS Take 20,000 Units by mouth 2 (two) times daily.    . Fingolimod HCl (GILENYA) 0.5 MG CAPS Take 0.5 mg by mouth daily.     . fluticasone (FLONASE) 50 MCG/ACT nasal spray Place 1 spray into both nostrils daily for 14 days. 16 g 0  . gabapentin (NEURONTIN) 300 MG capsule Take 1 capsule (300 mg total) by mouth 3 (three) times daily. (Patient taking differently: Take 1,200 mg by mouth 3 (three) times daily. ) 90 capsule 6  . HYDROmorphone (DILAUDID) 4 MG tablet Take one tablet po q 4 hrs prn severe pain. No more than 4.5 tablets per day 130 tablet 0  . LUTEIN PO Take 1 tablet by mouth daily.    . Multiple Vitamin (MULTIVITAMIN WITH MINERALS) TABS tablet Take 1 tablet by mouth daily. ALIVE    . Multiple Vitamins-Minerals (EMERGEN-C IMMUNE) PACK Take 1 packet by mouth once a week.     . naloxone (NARCAN) nasal spray 4 mg/0.1 mL Use as directed if accidental overdose 1 each 2  . ondansetron (ZOFRAN-ODT) 4 MG disintegrating tablet Take 1 tablet (4 mg total) by mouth every 8 (eight) hours as needed for nausea or vomiting. 20 tablet 0  . pantoprazole (PROTONIX) 40 MG tablet Take 40 mg by mouth daily.    . Polyethylene Glycol 400 (BLINK TEARS) 0.25 % SOLN Place 1 drop into the right eye 2 (two) times daily as needed (dry eyes).     . potassium chloride SA (K-DUR) 20 MEQ tablet TAKE ONE TABLET BY MOUTH TWICE A DAY (Patient taking differently: Take 20 mEq by mouth daily. ) 60 tablet 5  . predniSONE (DELTASONE) 10 MG tablet Take 2 tablets (20 mg total) by mouth daily. 15 tablet 0  . sertraline (ZOLOFT) 50 MG tablet Take 0.5 tablets (25 mg total) by mouth 2 (two) times daily. 30 tablet 5  . methylphenidate (RITALIN) 10 MG tablet Take 1 tablet (10 mg total) by mouth 2 (two) times daily. (Patient not taking: Reported on 02/04/2020) 60 tablet 0  . ondansetron (ZOFRAN) 4 MG tablet Take 1 tablet (4 mg total) by mouth  every 6 (six) hours. (Patient not taking: Reported on 02/04/2020) 12 tablet 0  . ondansetron (ZOFRAN) 4 MG tablet Take 1 tablet (4 mg total) by mouth every 8 (eight) hours as needed for nausea or vomiting. (Patient not taking: Reported on 02/04/2020) 10 tablet 0  . torsemide (DEMADEX) 20 MG tablet TAKE TWO TABLETS BY MOUTH IN THE [facility] ONE TABLET AT NOON IF NEEDED (Patient not taking: Reported on 02/04/2020) 90 tablet 5    Allergies as of 02/04/2020 - Review Complete 02/04/2020  Allergen Reaction Noted  . Ambien [zolpidem tartrate]  02/08/2014  . Tape Itching and Rash 03/26/2011    Family History  Problem Relation Age of Onset  . Heart disease Father   . Hyperlipidemia Father   . Congestive Heart Failure Father   . Hypertension Sister   . Other Paternal Uncle        Possible colon cancer, age greater than 49  . Cirrhosis Brother        etoh  . Healthy Mother   . Healthy Daughter   . Healthy Daughter  Social History   Socioeconomic History  . Marital status: Married    Spouse name: Not on file  . Number of children: 2  . Years of education: some college  . Highest education level: Not on file  Occupational History  . Occupation: disabled  Tobacco Use  . Smoking status: Former Smoker    Types: Cigarettes    Quit date: 1990    Years since quitting: 31.6  . Smokeless tobacco: Never Used  Vaping Use  . Vaping Use: Never used  Substance and Sexual Activity  . Alcohol use: No  . Drug use: No  . Sexual activity: Not on file  Other Topics Concern  . Not on file  Social History Narrative   Lives at home with husband and 2 daughters   Right handed   Takes 1/2 caffeine pill per day   Social Determinants of Health   Financial Resource Strain:   . Difficulty of Paying Living Expenses: Not on file  Food Insecurity:   . Worried About Programme researcher, broadcasting/film/video in the Last Year: Not on file  . Ran Out of Food in the Last Year: Not on file  Transportation Needs:   . Lack of  Transportation (Medical): Not on file  . Lack of Transportation (Non-Medical): Not on file  Physical Activity:   . Days of Exercise per Week: Not on file  . Minutes of Exercise per Session: Not on file  Stress:   . Feeling of Stress : Not on file  Social Connections:   . Frequency of Communication with Friends and Family: Not on file  . Frequency of Social Gatherings with Friends and Family: Not on file  . Attends Religious Services: Not on file  . Active Member of Clubs or Organizations: Not on file  . Attends Banker Meetings: Not on file  . Marital Status: Not on file  Intimate Partner Violence:   . Fear of Current or Ex-Partner: Not on file  . Emotionally Abused: Not on file  . Physically Abused: Not on file  . Sexually Abused: Not on file    Review of Systems:  Physical Exam: Vital signs in last 24 hours: Temp:  [97.9 F (36.6 C)] 97.9 F (36.6 C) (08/27 1807) Pulse Rate:  [52-96] 74 (08/28 1200) Resp:  [18-41] 22 (08/28 1200) BP: (124-139)/(60-88) 137/78 (08/28 1200) SpO2:  [80 %-100 %] 100 % (08/28 1200) FiO2 (%):  [100 %] 100 % (08/28 0421)  Seen in ED room 3 General:   She appears short of breath.  She is barely able to speak in complete sentences. Head:  Normocephalic and atraumatic. Eyes:  Sclera clear, no icterus.   Conjunctiva pink. Abdomen: Nondistended.  Positive bowel sounds.  Soft and nontender without appreciable mass organomegaly   Intake/Output from previous day: 08/27 0701 - 08/28 0700 In: 1603.8 [I.V.:1507.5; IV Piggyback:96.3] Out: 900 [Urine:900] Intake/Output this shift: Total I/O In: 99.3 [IV Piggyback:99.3] Out: -   Lab Results: Recent Labs    02/04/20 0120 02/05/20 0555 02/06/20 0747  WBC 5.2 6.2 10.3  HGB 11.6* 11.0* 11.5*  HCT 37.2 34.9* 37.7  PLT 307 332 410*   BMET Recent Labs    02/04/20 0120 02/05/20 0555 02/06/20 0747  NA 137 141 140  K 3.5 3.7 3.8  CL 98 106 104  CO2 GLUCOSE 113* 102* 96    BUN CREATININE 1.01* 0.66 0.59  CALCIUM 8.6* 8.4* 8.4*   LFT  Recent Labs    02/06/20 0747  PROT 6.8  ALBUMIN 2.7*  AST 53*  ALT 60*  ALKPHOS 106  BILITOT 0.8   PT/INR No results for input(s): LABPROT, INR in the last 72 hours. Hepatitis Panel No results for input(s): HEPBSAG, HCVAB, HEPAIGM, HEPBIGM in the last 72 hours. C-Diff No results for input(s): CDIFFTOX in the last 72 hours.  Studies/Results: CT CHEST WO CONTRAST  Result Date: 02/05/2020 CLINICAL DATA:  Pneumomediastinum on preceding CT angio chest, repeat CT imaging following water-soluble esophagram EXAM: CT CHEST WITHOUT CONTRAST TECHNIQUE: Multidetector CT imaging of the chest was performed following the standard protocol without IV contrast. Sagittal and coronal MPR images reconstructed from axial data set. COMPARISON:  Earlier CT angio chest 02/05/2020 FINDINGS: Cardiovascular: Aorta normal caliber. LEFT subclavian Port-A-Cath, tip SVC. No pericardial effusion. Minimal enlargement of cardiac chambers. Mediastinum/Nodes: Again identified pneumomediastinum extending into base of cervical region. Esophagus contains a small amount of refluxed contrast from the known hiatal hernia. No contrast extravasation identified. Lungs/Pleura: Extensive patchy BILATERAL airspace infiltrates consistent with multifocal pneumonia. No pleural effusion or pneumothorax. Large bleb medial RIGHT lower lobe. Upper Abdomen: Prior gastric sleeve resection. Remaining visualized upper abdomen unremarkable. Musculoskeletal: No acute osseous findings. IMPRESSION: Again identified pneumomediastinum extending into base of cervical region. No contrast extravasation from the esophagus identified. Hiatal hernia. Extensive BILATERAL airspace infiltrates consistent with multifocal pneumonia. Prior gastric sleeve resection. Electronically Signed   By: Ulyses Southward M.D.   On: 02/05/2020 15:11   CT ANGIO CHEST PE W OR WO CONTRAST  Result Date:  02/05/2020 CLINICAL DATA:  Shortness of breath. COVID positive. Elevated D-dimer. EXAM: CT ANGIOGRAPHY CHEST WITH CONTRAST TECHNIQUE: Multidetector CT imaging of the chest was performed using the standard protocol during bolus administration of intravenous contrast. Multiplanar CT image reconstructions and MIPs were obtained to evaluate the vascular anatomy. CONTRAST:  OMNIPAQUE IOHEXOL 350 MG/ML SOLN COMPARISON:  No comparison studies available. FINDINGS: Cardiovascular: The heart size is normal. Trace pericardial effusion. Right Port-A-Cath tip is positioned in the upper right atrium. No evidence for filling defect in the opacified pulmonary arteries to suggest the presence of an acute pulmonary embolus Mediastinum/Nodes: Large volume pneumomediastinum evident. No mediastinal lymphadenopathy. There is no hilar lymphadenopathy. The esophagus has normal imaging features. Small hiatal hernia noted in this patient status post sleeve gastrectomy. There is no axillary lymphadenopathy. Lungs/Pleura: Patchy areas of ground-glass opacity are noted in the lungs bilaterally with confluent regional ground-glass disease in the left upper, left lower, and basilar right lower lobes. No pleural effusion. Upper Abdomen: Small hypodensity in the anterior spleen is too small to characterize but likely benign. Musculoskeletal: No worrisome lytic or sclerotic osseous abnormality. Review of the MIP images confirms the above findings. IMPRESSION: 1. No CT evidence for acute pulmonary embolus. 2. Large volume pneumomediastinum, new since yesterday's chest x-ray. This is probably related to the underlying COVID lung disease. No obvious esophageal wall thickening or mediastinal fluid to suggest esophageal perforation. If esophageal injury is a concern, contrast esophagram could be used to further evaluate. 3. Patchy areas of ground-glass opacity in the lungs bilaterally with confluent regional ground-glass disease in the left upper,  left lower, and basilar right lower lobes. Imaging features compatible with multifocal pneumonia in this patient with a history of COVID-19. 4. Status post sleeve gastrectomy with small hiatal hernia. Electronically Signed   By: Kennith Center M.D.   On: 02/05/2020 09:15   US Abdomen Limited RUQ  Result Date: 02/04/2020 CLINICAL DATA:  Nausea vomiting.  Abnormal LFTs. EXAM: ULTRASOUND ABDOMEN LIMITED RIGHT UPPER QUADRANT COMPARISON:  06/16/2007. FINDINGS: Gallbladder: Cholecystectomy. Common bile duct: Diameter: 3 mm Liver: Increased echogenicity consistent fatty infiltration or hepatocellular disease. No focal hepatic abnormality identified. Portal vein is patent on color Doppler imaging with normal direction of blood flow towards the liver. Other: None. IMPRESSION: 1.  Prior cholecystectomy.  No biliary distention. 2. Increased hepatic echogenicity consistent fatty infiltration or hepatocellular disease. No focal hepatic abnormality identified. Electronically Signed   By: Maisie Fus  Register   On: 02/04/2020 13:32   DG ESOPHAGUS W SINGLE CM (SOL OR THIN BA)  Result Date: 02/05/2020 CLINICAL DATA:  Pneumomediastinum on CT question esophageal perforation EXAM: ESOPHOGRAM/BARIUM SWALLOW TECHNIQUE: Single contrast examination was performed using water soluble contrast. FLUOROSCOPY TIME:  Fluoroscopy Time:  1 minutes 48 seconds Radiation Exposure Index (if provided by the fluoroscopic device): 49.5 mGy Number of Acquired Spot Images: multiple fluoroscopic screen captures COMPARISON:  CT angio chest 02/05/2020 FINDINGS: Patient swallowed Omnipaque 300 and imaging was performed. Study performed with patient supine, LPO, and RPO with head of bed elevated 20 degrees Normal esophageal motility. Small hiatal hernia. Prominent gastroesophageal reflux to the level of the clavicular heads. No contrast extravasation identified. No areas of esophageal wall irregularity, narrowing, or intraluminal filling defects seen.  IMPRESSION: No esophageal injuries identified. Hiatal hernia with significant gastroesophageal reflux. Electronically Signed   By: Ulyses Southward M.D.   On: 02/05/2020 15:03   Impression: 49 year old lady admitted to the hospital with hypoxemic respiratory failure secondary to Covid pneumonia.  Mildly elevated aminotransferases, alkaline phosphatase and GGT.  Trending improvement noted on serial labs.  Findings are entirely nonspecific but I suspect part and parcel secondary to acute viral illness. Baseline steatohepatitis and/or medication effect would certainly remain in the differential.  Likewise, mildly elevated lipase is nonspecific in this setting.  Clinically, no pancreatitis.  Gastroesophageal reflux noted to the level of the clavicles on recent barium swallow. This may be contributing to some of her nausea and vomiting in the setting of acute viral illness.  Recommendations: Would increase her PPI to twice daily.  Continue to trend LFTs.  If they continue to improve,  we may be able to avoid an extensive liver work-up.   We will follow along with you.        Notice:  This dictation was prepared with Dragon dictation along with smaller phrase technology. Any transcriptional errors that result from this process are unintentional and may not be corrected upon review.

## 2020-02-06 NOTE — Progress Notes (Signed)
PROGRESS NOTE    Melinda Moore  ZOX:[id number] DOB: Jan 05, 1971 DOA: 02/04/2020 PCP: Babs Sciara, MD   Brief Narrative:  Per HPI: Melinda Moore a 49 y.o.femalewith medical history significant ofMS, hypertension, anxiety, depression, Bell's palsy, DVT in 2013, fibromyalgia, trigeminal neuralgia presenting with complaints of shortness of breath after recently testing positive for Covid on 8/23 during an urgent care visit.Patient reports 5-day history of shortness of breath, fatigue,nausea and poor oral intake. Denies fevers, cough, or chest pain. States she has had some slight vomiting at home but is mostly feeling nauseous and has lost her appetite. She experienced some epigastric abdominal discomfort when she vomited but denies abdominal pain otherwise. Denies diarrhea. She has not been vaccinated against Covid.  8/26:Patient has been admitted for acute hypoxemic respiratory failure in the setting of COVID-19 pneumonia. Oxygen supplementation requirements have increased since admission up to 8 L this morning. She does not appear to be quite short of breath however. Patient has been started on Solu-Medrol as well as remdesivir. Plan to add bariticinibtoday in order to assist further with treatment given high O2 requirements as well as increase in inflammatory markers. Procalcitonin low, therefore avoid antibiotics. Patient does have some nausea and vomiting likely related to Covid. Patient noted to have elevated alkaline phosphatase as well as GGT and minimal increase in lipase. Plan for right upper quadrant ultrasound today and maintain on clear liquids as well as IV fluid for now. Spoke with daughterAnaya [phone].  8/27: Patient continues to have worsening hypoxemia as well as elevated D-dimer.  Lovenox changed to twice daily.  CT chest angiogram with no sign of PE, but unfortunately there does appear to be a large pneumomediastinum.  Discussed case with CT surgery with barium  esophagram performed with no contrast extravasation noted.  Patient down to 5 L nasal cannula at this time and otherwise appears comfortable.  She is still having some trouble with nausea and vomiting intermittently.  8/28: Patient noted to have persistent hypoxemia and oxygen demand seems to have worsened to 10 L this a.m.  She is also had quite a bit of chest congestion as well as indigestion, nausea, and vomiting despite scheduling of Zofran and Phenergan as needed.  Continue clear liquid diet for now.  Question the presence of a microperforation that would have contributed to large pneumomediastinum.  Fortunately, imaging with barium esophagram on 8/27 with no acute findings noted.  Appreciate GI consultation for assistance in management of persistent GI symptoms as well as concern for microperforation.  Assessment & Plan:   Principal Problem:   Pneumonia due to COVID-19 virus Active Problems:   Elevated alkaline phosphatase level   Chronic pain syndrome   Acute hypoxemic respiratory failure (HCC)   Elevated transaminase level   Acute respiratory failure due to COVID-19 [facility])   Acute hypoxemic respiratory failure secondary to COVID-19 viral pneumonia: Tested positive for Covid on 02/01/2020. Febrile, tachycardic, and tachypneic. Oxygen saturation 86% on room air. Currently satting 89-90% on 7 L supplemental oxygen. Chest x-ray showing developing infiltrate in the left mid to lower lung field and right lung base. Bacterial pneumonia less likely given no leukocytosis or procalcitonin elevation. Inflammatory markers elevated: D-dimer 1.62, LDH 361, ferritin 233, fibrinogen 762, and CRP 17.4. Lactic acid normal x2. -Remdesivir dosing per pharmacy -IV Solu-Medrol 0.5 mg/kg every 12 hours -Vitamin C, zinc, vitamin D -Antitussives as needed -Tylenol as needed -Daily CBC with differential, CMP, CRP, D-dimer -Encourage prone positioning -Incentive spirometry, flutter valve -Airborne and  contact precautions -Continuous pulse ox -Supplemental oxygen as needed to keep oxygen saturation above 90% -Blood culture x2 negative for 1 day -Plan to add Mucinex for chest congestion today  COVID-19 viral gastroenteritis:Patient reports having nausea and a few episodes of vomiting at home. Endorsing poor oral intake. Transaminases elevated in the setting of acute viral illness. T bili normal. Abdominal exam benign. -Gentle IV fluid hydration, antiemetic will be scheduled every 6 hours for now -Appreciate GI evaluation for assistance in persistent indigestion, N/V  Large pneumomediastinum -Discussed case with CT surgery, esophagram and CT chest performed with no sign of extravasation noted -Clear liquid diet for now and advance as tolerated -Appreciate GI evaluation for concern of microperforation as well as ongoing indigestion issues -Start PPI IV qdaily -Bedrest -Avoid incentive spirometry and flutter valve  Elevated transaminases:Likely due to COVID-19 viral infection. -Continue to monitor LFTs  Mildly elevated alkaline phosphatase level -GGT elevated, but no significant findings on right upper quadrant ultrasound aside from fatty liver disease  Chronic pain syndrome -Resume home Dilaudid as needed q6 hours for severe pain  Anxiety, depression -Resume home medications  MS -Resume home medications as appropriate   DVT prophylaxis:Lovenox BID Code Status: Full Family Communication: Discussed with daughter 8/26, 8/27, 8/28 Disposition Plan:  Status is: Inpatient  Remains inpatient appropriate because:IV treatments appropriate due to intensity of illness or inability to take PO and Inpatient level of care appropriate due to severity of illness   Dispo: The patient is from: Home  Anticipated d/c is to: Home  Anticipated d/c date is: 2 days  Patient currently is not medically stable to d/c.  Consultants:    Discussed with CT Surgery 8/27  Procedures:   See below  Antimicrobials:  Anti-infectives (From admission, onward)   Start     Dose/Rate Route Frequency Ordered Stop   02/05/20 1000  remdesivir 100 mg in sodium chloride 0.9 % 100 mL IVPB        100 mg 200 mL/hr over 30 Minutes Intravenous Daily 02/04/20 0406 02/09/20 0959   02/04/20 0415  remdesivir 100 mg in sodium chloride 0.9 % 100 mL IVPB        100 mg 200 mL/hr over 30 Minutes Intravenous Every 30 min 02/04/20 0406 02/04/20 0522      Subjective: Patient seen and evaluated today with some ongoing nausea and vomiting despite scheduling of Zofran.  She is also complaining of indigestion.  She was noted to have large pneumomediastinum yesterday, but with no leak noted on barium esophagram.  She is also complaining of chest congestion and mucus.  Objective: Vitals:   02/06/20 1030 02/06/20 1100 02/06/20 1130 02/06/20 1200  BP: 136/76 132/78 133/80 137/78  Pulse: 76 92 83 74  Resp: (!) 25 (!) 22 (!) 27 (!) 22  Temp:      TempSrc:      SpO2: 99% 98% 90% 100%  Weight:      Height:        Intake/Output Summary (Last 24 hours) at 02/06/2020 1318 Last data filed at 02/06/2020 1120 Gross per 24 hour  Intake 1606.85 ml  Output 900 ml  Net 706.85 ml   Filed Weights   02/04/20 0138  Weight: 113.3 kg    Examination:  General exam: Appears calm and comfortable  Respiratory system: Clear to auscultation. Respiratory effort normal.  Currently on 10 L nasal cannula oxygen. Cardiovascular system: S1 & S2 heard, RRR. Gastrointestinal system: Abdomen is nondistended, soft and nontender.  Central nervous system:  Alert and oriented. No focal neurological deficits. Extremities: Symmetric 5 x 5 power. Skin: No rashes, lesions or ulcers Psychiatry: Judgement and insight appear normal. Mood & affect appropriate.     Data Reviewed: I have personally reviewed following labs and imaging studies  CBC: Recent Labs  Lab  02/04/20 0120 02/05/20 0555 02/06/20 0747  WBC 5.2 6.2 10.3  NEUTROABS 4.6 4.8 8.9*  HGB 11.6* 11.0* 11.5*  HCT 37.2 34.9* 37.7  MCV 84.9 83.9 85.3  PLT 307 332 410*   Basic Metabolic Panel: Recent Labs  Lab 02/04/20 0120 02/05/20 0555 02/06/20 0747  NA 137 141 140  K 3.5 3.7 3.8  CL 98 106 104  CO2 24 24 24   GLUCOSE 113* 102* 96  BUN 16 14 15   CREATININE 1.01* 0.66 0.59  CALCIUM 8.6* 8.4* 8.4*   GFR: Estimated Creatinine Clearance: 112.4 mL/min (by C-G formula based on SCr of 0.59 mg/dL). Liver Function Tests: Recent Labs  Lab 02/04/20 0120 02/05/20 0555 02/06/20 0747  AST 100* 60* 53*  ALT 74* 56* 60*  ALKPHOS 132* 103 106  BILITOT 0.7 0.5 0.8  PROT 8.2* 6.7 6.8  ALBUMIN 3.5 2.7* 2.7*   Recent Labs  Lab 02/04/20 0327  LIPASE 126*   No results for input(s): AMMONIA in the last 168 hours. Coagulation Profile: No results for input(s): INR, PROTIME in the last 168 hours. Cardiac Enzymes: No results for input(s): CKTOTAL, CKMB, CKMBINDEX, TROPONINI in the last 168 hours. BNP (last 3 results) No results for input(s): PROBNP in the last 8760 hours. HbA1C: No results for input(s): HGBA1C in the last 72 hours. CBG: No results for input(s): GLUCAP in the last 168 hours. Lipid Profile: Recent Labs    02/04/20 0120  TRIG 127   Thyroid Function Tests: No results for input(s): TSH, T4TOTAL, FREET4, T3FREE, THYROIDAB in the last 72 hours. Anemia Panel: Recent Labs    02/04/20 0120  FERRITIN 233   Sepsis Labs: Recent Labs  Lab 02/04/20 0120 02/04/20 0327  PROCALCITON <0.10  --   LATICACIDVEN 1.6 1.0    Recent Results (from the past 240 hour(s))  Novel Coronavirus, NAA ([facility])     Status: Abnormal   Collection Time: 02/01/20  8:49 AM   Specimen: Nasopharyngeal Swab; Nasopharyngeal(NP) swabs in vial transport medium   Nasopharynge  Result Value Ref Range Status   SARS-CoV-2, NAA Detected (A) Not Detected Final    Comment: Patients who have a  positive COVID-19 test result may now have treatment options. Treatment options are available for patients with mild to moderate symptoms and for hospitalized patients. Visit our website at 02/06/20 for resources and information. This nucleic acid amplification test was developed and its performance characteristics determined by 02/03/20. Nucleic acid amplification tests include RT-PCR and TMA. This test has not been FDA cleared or approved. This test has been authorized by FDA under an Emergency Use Authorization (EUA). This test is only authorized for the duration of time the declaration that circumstances exist justifying the authorization of the emergency use of in vitro diagnostic tests for detection of SARS-CoV-2 virus and/or diagnosis of COVID-19 infection under section 564(b)(1) of the Act, 21 U.S.C. [employer]si) (1), unless the authorization is terminated or revoked sooner. When diagnostic testing is negativ e, the possibility of a false negative result should be considered in the context of a patient's recent exposures and the presence of clinical signs and symptoms consistent with COVID-19. An individual without symptoms of COVID-19 and who is not shedding  SARS-CoV-2 virus would expect to have a negative (not detected) result in this assay.   SARS-COV-2, NAA 2 DAY TAT     Status: None   Collection Time: 02/01/20  8:49 AM   Nasopharynge  Result Value Ref Range Status   SARS-CoV-2, NAA 2 DAY TAT Performed  Final  Blood Culture (routine x 2)     Status: None (Preliminary result)   Collection Time: 02/04/20  1:20 AM   Specimen: Right Antecubital; Blood  Result Value Ref Range Status   Specimen Description RIGHT ANTECUBITAL  Final   Special Requests   Final    BOTTLES DRAWN AEROBIC AND ANAEROBIC Blood Culture adequate volume   Culture   Final    NO GROWTH 1 DAY Performed at [facility], [address], [place], [place] [address]    Report  Status PENDING  Incomplete  Blood Culture (routine x 2)     Status: None (Preliminary result)   Collection Time: 02/04/20  3:27 AM   Specimen: BLOOD LEFT ARM  Result Value Ref Range Status   Specimen Description BLOOD LEFT ARM  Final   Special Requests   Final    BOTTLES DRAWN AEROBIC AND ANAEROBIC Blood Culture adequate volume   Culture   Final    NO GROWTH 1 DAY Performed at [facility], [address], [place], [place] [address]    Report Status PENDING  Incomplete         Radiology Studies: CT CHEST WO CONTRAST  Result Date: 02/05/2020 CLINICAL DATA:  Pneumomediastinum on preceding CT angio chest, repeat CT imaging following water-soluble esophagram EXAM: CT CHEST WITHOUT CONTRAST TECHNIQUE: Multidetector CT imaging of the chest was performed following the standard protocol without IV contrast. Sagittal and coronal MPR images reconstructed from axial data set. COMPARISON:  Earlier CT angio chest 02/05/2020 FINDINGS: Cardiovascular: Aorta normal caliber. LEFT subclavian Port-A-Cath, tip SVC. No pericardial effusion. Minimal enlargement of cardiac chambers. Mediastinum/Nodes: Again identified pneumomediastinum extending into base of cervical region. Esophagus contains a small amount of refluxed contrast from the known hiatal hernia. No contrast extravasation identified. Lungs/Pleura: Extensive patchy BILATERAL airspace infiltrates consistent with multifocal pneumonia. No pleural effusion or pneumothorax. Large bleb medial RIGHT lower lobe. Upper Abdomen: Prior gastric sleeve resection. Remaining visualized upper abdomen unremarkable. Musculoskeletal: No acute osseous findings. IMPRESSION: Again identified pneumomediastinum extending into base of cervical region. No contrast extravasation from the esophagus identified. Hiatal hernia. Extensive BILATERAL airspace infiltrates consistent with multifocal pneumonia. Prior gastric sleeve resection. Electronically Signed   By: Ulyses Southward M.D.   On:  02/05/2020 15:11   CT ANGIO CHEST PE W OR WO CONTRAST  Result Date: 02/05/2020 CLINICAL DATA:  Shortness of breath. COVID positive. Elevated D-dimer. EXAM: CT ANGIOGRAPHY CHEST WITH CONTRAST TECHNIQUE: Multidetector CT imaging of the chest was performed using the standard protocol during bolus administration of intravenous contrast. Multiplanar CT image reconstructions and MIPs were obtained to evaluate the vascular anatomy. CONTRAST:  OMNIPAQUE IOHEXOL 350 MG/ML SOLN COMPARISON:  No comparison studies available. FINDINGS: Cardiovascular: The heart size is normal. Trace pericardial effusion. Right Port-A-Cath tip is positioned in the upper right atrium. No evidence for filling defect in the opacified pulmonary arteries to suggest the presence of an acute pulmonary embolus Mediastinum/Nodes: Large volume pneumomediastinum evident. No mediastinal lymphadenopathy. There is no hilar lymphadenopathy. The esophagus has normal imaging features. Small hiatal hernia noted in this patient status post sleeve gastrectomy. There is no axillary lymphadenopathy. Lungs/Pleura: Patchy areas of ground-glass opacity are noted in  the lungs bilaterally with confluent regional ground-glass disease in the left upper, left lower, and basilar right lower lobes. No pleural effusion. Upper Abdomen: Small hypodensity in the anterior spleen is too small to characterize but likely benign. Musculoskeletal: No worrisome lytic or sclerotic osseous abnormality. Review of the MIP images confirms the above findings. IMPRESSION: 1. No CT evidence for acute pulmonary embolus. 2. Large volume pneumomediastinum, new since yesterday's chest x-ray. This is probably related to the underlying COVID lung disease. No obvious esophageal wall thickening or mediastinal fluid to suggest esophageal perforation. If esophageal injury is a concern, contrast esophagram could be used to further evaluate. 3. Patchy areas of ground-glass opacity in the lungs  bilaterally with confluent regional ground-glass disease in the left upper, left lower, and basilar right lower lobes. Imaging features compatible with multifocal pneumonia in this patient with a history of COVID-19. 4. Status post sleeve gastrectomy with small hiatal hernia. Electronically Signed   By: Kennith Center M.D.   On: 02/05/2020 09:15   US Abdomen Limited RUQ  Result Date: 02/04/2020 CLINICAL DATA:  Nausea vomiting.  Abnormal LFTs. EXAM: ULTRASOUND ABDOMEN LIMITED RIGHT UPPER QUADRANT COMPARISON:  06/16/2007. FINDINGS: Gallbladder: Cholecystectomy. Common bile duct: Diameter: 3 mm Liver: Increased echogenicity consistent fatty infiltration or hepatocellular disease. No focal hepatic abnormality identified. Portal vein is patent on color Doppler imaging with normal direction of blood flow towards the liver. Other: None. IMPRESSION: 1.  Prior cholecystectomy.  No biliary distention. 2. Increased hepatic echogenicity consistent fatty infiltration or hepatocellular disease. No focal hepatic abnormality identified. Electronically Signed   By: Maisie Fus  Register   On: 02/04/2020 13:32   DG ESOPHAGUS W SINGLE CM (SOL OR THIN BA)  Result Date: 02/05/2020 CLINICAL DATA:  Pneumomediastinum on CT question esophageal perforation EXAM: ESOPHOGRAM/BARIUM SWALLOW TECHNIQUE: Single contrast examination was performed using water soluble contrast. FLUOROSCOPY TIME:  Fluoroscopy Time:  1 minutes 48 seconds Radiation Exposure Index (if provided by the fluoroscopic device): 49.5 mGy Number of Acquired Spot Images: multiple fluoroscopic screen captures COMPARISON:  CT angio chest 02/05/2020 FINDINGS: Patient swallowed Omnipaque 300 and imaging was performed. Study performed with patient supine, LPO, and RPO with head of bed elevated 20 degrees Normal esophageal motility. Small hiatal hernia. Prominent gastroesophageal reflux to the level of the clavicular heads. No contrast extravasation identified. No areas of esophageal  wall irregularity, narrowing, or intraluminal filling defects seen. IMPRESSION: No esophageal injuries identified. Hiatal hernia with significant gastroesophageal reflux. Electronically Signed   By: Ulyses Southward M.D.   On: 02/05/2020 15:03        Scheduled Meds: . albuterol      . vitamin C  500 mg Oral Daily  . baclofen  10 mg Oral TID  . baricitinib  4 mg Oral Daily  . buPROPion  150 mg Oral BID  . cholecalciferol  1,000 Units Oral Daily  . enoxaparin (LOVENOX) injection  40 mg Subcutaneous Q12H  . Fingolimod HCl  0.5 mg Oral Daily  . fluticasone  1 spray Each Nare Daily  . gabapentin  300 mg Oral TID  . loratadine  10 mg Oral Daily  . methylPREDNISolone (SOLU-MEDROL) injection  55 mg Intravenous Q12H  . ondansetron (ZOFRAN) IV  4 mg Intravenous Q6H  . pantoprazole (PROTONIX) IV  40 mg Intravenous Q24H  . sertraline  25 mg Oral BID  . zinc sulfate  220 mg Oral Daily   Continuous Infusions: . 0.9 % NaCl with KCl 20 mEq / L 100 mL/hr at 02/06/20  59  . remdesivir 100 mg in NS 100 mL Stopped (02/06/20 1026)     LOS: 2 days    Time spent: 35 minutes    Gabriele Zwilling D Sherryll Burger, DO [facility]  If 7PM-7AM, please contact night-coverage [email or web address] 02/06/2020, 1:18 PM

## 2020-02-06 NOTE — ED Notes (Signed)
This nurse to bedside- Dr Manus Gunning placing order for albuterol inhaler- Selena Batten, RT called to bedside.

## 2020-02-06 NOTE — ED Notes (Signed)
Pt breathing easier at this time- pt coughed up thick mucous and now is breathing easier. Pt sitting up in bed at 90 degrees.

## 2020-02-07 LAB — COMPREHENSIVE METABOLIC PANEL
ALT: 51 U/L — ABNORMAL HIGH (ref 0–44)
AST: 39 U/L (ref 15–41)
Albumin: 2.7 g/dL — ABNORMAL LOW (ref 3.5–5.0)
Alkaline Phosphatase: 94 U/L (ref 38–126)
Anion gap: 14 (ref 5–15)
BUN: 14 mg/dL (ref 6–20)
CO2: 24 mmol/L (ref 22–32)
Calcium: 8.3 mg/dL — ABNORMAL LOW (ref 8.9–10.3)
Chloride: 101 mmol/L (ref 98–111)
Creatinine, Ser: 0.61 mg/dL (ref 0.44–1.00)
GFR calc Af Amer: >60 mL/min (ref >60)
GFR calc non Af Amer: >60 mL/min (ref >60)
Glucose, Bld: 101 mg/dL — ABNORMAL HIGH (ref 70–99)
Potassium: 4.1 mmol/L (ref 3.5–5.1)
Sodium: 139 mmol/L (ref 135–145)
Total Bilirubin: 1 mg/dL (ref 0.3–1.2)
Total Protein: 6.7 g/dL (ref 6.5–8.1)

## 2020-02-07 LAB — CBC WITH DIFFERENTIAL/PLATELET
Abs Immature Granulocytes: 0.27 10*3/uL — ABNORMAL HIGH (ref 0.00–0.07)
Basophils Absolute: 0 10*3/uL (ref 0.0–0.1)
Basophils Relative: 0 %
Eosinophils Absolute: 0 10*3/uL (ref 0.0–0.5)
Eosinophils Relative: 0 %
HCT: 37 % (ref 36.0–46.0)
Hemoglobin: 11.5 g/dL — ABNORMAL LOW (ref 12.0–15.0)
Immature Granulocytes: 3 %
Lymphocytes Relative: 6 %
Lymphs Abs: 0.6 10*3/uL — ABNORMAL LOW (ref 0.7–4.0)
MCH: 26 pg (ref 26.0–34.0)
MCHC: 31.1 g/dL (ref 30.0–36.0)
MCV: 83.7 fL (ref 80.0–100.0)
Monocytes Absolute: 0.6 10*3/uL (ref 0.1–1.0)
Monocytes Relative: 6 %
Neutro Abs: 8.1 10*3/uL — ABNORMAL HIGH (ref 1.7–7.7)
Neutrophils Relative %: 85 %
Platelets: 444 10*3/uL — ABNORMAL HIGH (ref 150–400)
RBC: 4.42 MIL/uL (ref 3.87–5.11)
RDW: 14.3 % (ref 11.5–15.5)
WBC: 9.6 10*3/uL (ref 4.0–10.5)
nRBC: 0 % (ref 0.0–0.2)

## 2020-02-07 LAB — D-DIMER, QUANTITATIVE: D-Dimer, Quant: 1.37 ug/mL-FEU — ABNORMAL HIGH (ref 0.00–0.50)

## 2020-02-07 LAB — C-REACTIVE PROTEIN: CRP: 3.5 mg/dL — ABNORMAL HIGH (ref <1.0)

## 2020-02-07 MED ORDER — ORAL CARE MOUTH RINSE
15.0000 mL | Freq: Two times a day (BID) | OROMUCOSAL | Status: DC
  Administered 2020-02-07 – 2020-02-14 (×14): 15 mL via OROMUCOSAL

## 2020-02-07 MED ORDER — LACTATED RINGERS IV SOLN: INTRAVENOUS | Status: DC

## 2020-02-07 MED ORDER — BISACODYL 10 MG RE SUPP
10.0000 mg | Freq: Once | RECTAL | Status: DC
  Filled 2020-02-07: qty 1

## 2020-02-07 NOTE — Progress Notes (Signed)
Patient having difficulty with incentive spirometer. Encouraged approx. Every hour. Current goal , patient reached goal but unable to maintain. IS used at 1230 and patient attempted 7 times and could only achieve . Patient remains on HFNC 6L/m, encouraging deep breaths. Will continue to monitor.

## 2020-02-07 NOTE — Progress Notes (Signed)
PROGRESS NOTE    Melinda Moore  CBU:[id number] DOB: [birth year] DOA: 02/04/2020 PCP: Babs Sciara, MD   Brief Narrative:  Per HPI: Melinda Moore a 49 y.o.femalewith medical history significant ofMS, hypertension, anxiety, depression, Bell's palsy, DVT in 2013, fibromyalgia, trigeminal neuralgia presenting with complaints of shortness of breath after recently testing positive for Covid on 8/23 during an urgent care visit.Patient reports 5-day history of shortness of breath, fatigue,nausea and poor oral intake. Denies fevers, cough, or chest pain. States she has had some slight vomiting at home but is mostly feeling nauseous and has lost her appetite. She experienced some epigastric abdominal discomfort when she vomited but denies abdominal pain otherwise. Denies diarrhea. She has not been vaccinated against Covid.  8/26:Patient has been admitted for acute hypoxemic respiratory failure in the setting of COVID-19 pneumonia. Oxygen supplementation requirements have increased since admission up to 8 L this morning. She does not appear to be quite short of breath however. Patient has been started on Solu-Medrol as well as remdesivir. Plan to add bariticinibtoday in order to assist further with treatment given high O2 requirements as well as increase in inflammatory markers. Procalcitonin low, therefore avoid antibiotics. Patient does have some nausea and vomiting likely related to Covid. Patient noted to have elevated alkaline phosphatase as well as GGT and minimal increase in lipase. Plan for right upper quadrant ultrasound today and maintain on clear liquids as well as IV fluid for now. Spoke with daughterAnaya [phone].  8/27:Patient continues to have worsening hypoxemia as well as elevated D-dimer. Lovenox changed to twice daily. CT chest angiogram with no sign of PE, but unfortunately there does appear to be a large pneumomediastinum. Discussed case with CT surgery with barium  esophagram performed with no contrast extravasation noted. Patient down to 5 L nasal cannula at this time and otherwise appears comfortable. She is still having some trouble with nausea and vomiting intermittently.  8/28: Patient noted to have persistent hypoxemia and oxygen demand seems to have worsened to 10 L this a.m.  She is also had quite a bit of chest congestion as well as indigestion, nausea, and vomiting despite scheduling of Zofran and Phenergan as needed.  Continue clear liquid diet for now.  Question the presence of a microperforation that would have contributed to large pneumomediastinum.  Fortunately, imaging with barium esophagram on 8/27 with no acute findings noted.  Appreciate GI consultation for assistance in management of persistent GI symptoms as well as concern for microperforation.  8/29: Patient noted to have improvement in hypoxemia noted this morning.  She is down to 6 L nasal cannula and has not had a bowel movement in the last several days.  She denies any further nausea or vomiting and has been coughing up mucus.  Inflammatory markers downtrending.  Assessment & Plan:   Principal Problem:   Pneumonia due to COVID-19 virus Active Problems:   Elevated alkaline phosphatase level   Chronic pain syndrome   Acute hypoxemic respiratory failure (HCC)   Elevated transaminase level   Acute respiratory failure due to COVID-19 [facility]   Acute hypoxemic respiratory failure due to COVID-19 [facility])   Acute respiratory failure with hypoxia (HCC)   Acute hypoxemic respiratory failure secondary to COVID-19 viral pneumonia: Tested positive for Covid on 02/01/2020. Febrile, tachycardic, and tachypneic. Oxygen saturation 86% on room air. Currently satting 89-90% on 7 L supplemental oxygen. Chest x-ray showing developing infiltrate in the left mid to lower lung field and right lung base. Bacterial pneumonia less  likely given no leukocytosis or procalcitonin elevation. Inflammatory  markers elevated: D-dimer 1.62, LDH 361, ferritin 233, fibrinogen 762, and CRP 17.4. Lactic acid normal x2. -Remdesivir dosing per pharmacy -IV Solu-Medrol 0.5 mg/kg every 12 hours -Vitamin C, zinc, vitamin D -Antitussives as needed -Tylenol as needed -Daily CBC with differential, CMP, CRP, D-dimer -Encourage prone positioning -Incentive spirometry, flutter valve -Airborne and contact precautions -Continuous pulse ox -Supplemental oxygen as needed to keep oxygen saturation above 90% -Blood culture x2negative for 3 days -Plan to add Mucinex for chest congestion today  COVID-19 viral gastroenteritis:Patient reports having nausea and a few episodes of vomiting at home. Endorsing poor oral intake. Transaminases elevated in the setting of acute viral illness. T bili normal. Abdominal exam benign. -Gentle IV fluid hydration, antiemeticwill be scheduled every 6 hours for now -Appreciate GI ongoing evaluation and recommendations -Trial of suppository for constipation.  Large pneumomediastinum -Discussed case with CT surgery, esophagram and CT chest performed with no sign of extravasation noted -Clear liquid diet to advance to full liquid today. -Appreciate GI ongoing evaluation -PPI IV to twice daily per GI -Bedrest -Avoid incentive spirometry and flutter valve  Elevated transaminases:Likely due to COVID-19 viral infection. -Continue to monitor LFTs  Mildly elevated alkaline phosphatase level -GGT elevated, but no significant findings on right upper quadrant ultrasound aside from fatty liver disease  Chronic pain syndrome -Resume home Dilaudid as neededq6 hours for severe pain  Anxiety, depression -Resume home medications  MS -Resume home medications as appropriate   DVT prophylaxis:Lovenox BID Code Status:Full Family Communication:Discussed with daughter 8/26, 8/27, 8/28, 8/29 Disposition Plan: Status is: Inpatient  Remains inpatient appropriate  because:IV treatments appropriate due to intensity of illness or inability to take PO and Inpatient level of care appropriate due to severity of illness   Dispo: The patient is from:Home Anticipated d/c is ON:[facility] Anticipated d/c date is: 2 days Patient currently is not medically stable to d/c.  Consultants:  Discussed with CT Surgery 8/27  GI  Procedures:  See below  Antimicrobials:  Anti-infectives (From admission, onward)   Start     Dose/Rate Route Frequency Ordered Stop   02/05/20 1000  remdesivir 100 mg in sodium chloride 0.9 % 100 mL IVPB        100 mg 200 mL/hr over 30 Minutes Intravenous Daily 02/04/20 0406 02/09/20 0959   02/04/20 0415  remdesivir 100 mg in sodium chloride 0.9 % 100 mL IVPB        100 mg 200 mL/hr over 30 Minutes Intravenous Every 30 min 02/04/20 0406 02/04/20 0522      Subjective: Patient seen and evaluated today with no new acute complaints or concerns. No acute concerns or events noted overnight.  She is still coughing up quite a bit of mucus, but states that her nausea and vomiting are improving.  Plan to advance diet.  Objective: Vitals:   02/07/20 0830 02/07/20 0930 02/07/20 0944 02/07/20 1038  BP: 132/80 121/68  117/71  Pulse: 78 79 79 82  Resp: (!) 25 (!) 34 (!) 29 (!) 24  Temp:    98.1 F (36.7 C)  TempSrc:      SpO2: 97%  92% 90%  Weight:      Height:        Intake/Output Summary (Last 24 hours) at 02/07/2020 1047 Last data filed at 02/07/2020 0403 Gross per 24 hour  Intake 99.32 ml  Output 1550 ml  Net -1450.68 ml   Filed Weights   02/04/20 0138  Weight: 113.3  kg    Examination:  General exam: Appears calm and comfortable  Respiratory system: Clear to auscultation. Respiratory effort normal.  Currently on 6 L nasal cannula oxygen. Cardiovascular system: S1 & S2 heard, RRR. Gastrointestinal system: Abdomen is nondistended, soft and nontender. Central nervous system: Alert  and oriented. No focal neurological deficits. Extremities: Symmetric 5 x 5 power. Skin: No rashes, lesions or ulcers Psychiatry: Judgement and insight appear normal. Mood & affect appropriate.     Data Reviewed: I have personally reviewed following labs and imaging studies  CBC: Recent Labs  Lab 02/04/20 0120 02/05/20 0555 02/06/20 0747 02/07/20 0839  WBC 5.2 6.2 10.3 9.6  NEUTROABS 4.6 4.8 8.9* 8.1*  HGB 11.6* 11.0* 11.5* 11.5*  HCT 37.2 34.9* 37.7 37.0  MCV 84.9 83.9 85.3 83.7  PLT 307 332 410* 444*   Basic Metabolic Panel: Recent Labs  Lab 02/04/20 0120 02/05/20 0555 02/06/20 0747 02/07/20 0839  NA 137 141 140 139  K 3.5 3.7 3.8 4.1  CL 98 106 104 101  CO2 24 24 24 24   GLUCOSE 113* 102* 96 101*  BUN 16 14 15 14   CREATININE 1.01* 0.66 0.59 0.61  CALCIUM 8.6* 8.4* 8.4* 8.3*   GFR: Estimated Creatinine Clearance: 112.4 mL/min (by C-G formula based on SCr of 0.61 mg/dL). Liver Function Tests: Recent Labs  Lab 02/04/20 0120 02/05/20 0555 02/06/20 0747 02/07/20 0839  AST 100* 60* 53* 39  ALT 74* 56* 60* 51*  ALKPHOS 132* 103 106 94  BILITOT 0.7 0.5 0.8 1.0  PROT 8.2* 6.7 6.8 6.7  ALBUMIN 3.5 2.7* 2.7* 2.7*   Recent Labs  Lab 02/04/20 0327  LIPASE 126*   No results for input(s): AMMONIA in the last 168 hours. Coagulation Profile: No results for input(s): INR, PROTIME in the last 168 hours. Cardiac Enzymes: No results for input(s): CKTOTAL, CKMB, CKMBINDEX, TROPONINI in the last 168 hours. BNP (last 3 results) No results for input(s): PROBNP in the last 8760 hours. HbA1C: No results for input(s): HGBA1C in the last 72 hours. CBG: No results for input(s): GLUCAP in the last 168 hours. Lipid Profile: No results for input(s): CHOL, HDL, LDLCALC, TRIG, CHOLHDL, LDLDIRECT in the last 72 hours. Thyroid Function Tests: No results for input(s): TSH, T4TOTAL, FREET4, T3FREE, THYROIDAB in the last 72 hours. Anemia Panel: No results for input(s):  VITAMINB12, FOLATE, FERRITIN, TIBC, IRON, RETICCTPCT in the last 72 hours. Sepsis Labs: Recent Labs  Lab 02/04/20 0120 02/04/20 0327  PROCALCITON <0.10  --   LATICACIDVEN 1.6 1.0    Recent Results (from the past 240 hour(s))  Novel Coronavirus, NAA ([facility])     Status: Abnormal   Collection Time: 02/01/20  8:49 AM   Specimen: Nasopharyngeal Swab; Nasopharyngeal(NP) swabs in vial transport medium   Nasopharynge  Result Value Ref Range Status   SARS-CoV-2, NAA Detected (A) Not Detected Final    Comment: Patients who have a positive COVID-19 test result may now have treatment options. Treatment options are available for patients with mild to moderate symptoms and for hospitalized patients. Visit our website at [email or web address] for resources and information. This nucleic acid amplification test was developed and its performance characteristics determined by [employer]. Nucleic acid amplification tests include RT-PCR and TMA. This test has not been FDA cleared or approved. This test has been authorized by FDA under an Emergency Use Authorization (EUA). This test is only authorized for the duration of time the declaration that circumstances exist justifying the authorization of the emergency use  of in vitro diagnostic tests for detection of SARS-CoV-2 virus and/or diagnosis of COVID-19 infection under section 564(b)(1) of the Act, 21 U.S.C. 024OXB-3(Z) (1), unless the authorization is terminated or revoked sooner. When diagnostic testing is negativ e, the possibility of a false negative result should be considered in the context of a patient's recent exposures and the presence of clinical signs and symptoms consistent with COVID-19. An individual without symptoms of COVID-19 and who is not shedding SARS-CoV-2 virus would expect to have a negative (not detected) result in this assay.   SARS-COV-2, NAA 2 DAY TAT     Status: None   Collection Time: 02/01/20   8:49 AM   Nasopharynge  Result Value Ref Range Status   SARS-CoV-2, NAA 2 DAY TAT Performed  Final  Blood Culture (routine x 2)     Status: None (Preliminary result)   Collection Time: 02/04/20  1:20 AM   Specimen: Right Antecubital; Blood  Result Value Ref Range Status   Specimen Description RIGHT ANTECUBITAL  Final   Special Requests   Final    BOTTLES DRAWN AEROBIC AND ANAEROBIC Blood Culture adequate volume   Culture   Final    NO GROWTH 3 DAYS Performed at [facility], [address], [place], [place] [address]    Report Status PENDING  Incomplete  Blood Culture (routine x 2)     Status: None (Preliminary result)   Collection Time: 02/04/20  3:27 AM   Specimen: BLOOD LEFT ARM  Result Value Ref Range Status   Specimen Description BLOOD LEFT ARM  Final   Special Requests   Final    BOTTLES DRAWN AEROBIC AND ANAEROBIC Blood Culture adequate volume   Culture   Final    NO GROWTH 3 DAYS Performed at [facility], [address], [place], [place] [address]    Report Status PENDING  Incomplete         Radiology Studies: CT CHEST WO CONTRAST  Result Date: 02/05/2020 CLINICAL DATA:  Pneumomediastinum on preceding CT angio chest, repeat CT imaging following water-soluble esophagram EXAM: CT CHEST WITHOUT CONTRAST TECHNIQUE: Multidetector CT imaging of the chest was performed following the standard protocol without IV contrast. Sagittal and coronal MPR images reconstructed from axial data set. COMPARISON:  Earlier CT angio chest 02/05/2020 FINDINGS: Cardiovascular: Aorta normal caliber. LEFT subclavian Port-A-Cath, tip SVC. No pericardial effusion. Minimal enlargement of cardiac chambers. Mediastinum/Nodes: Again identified pneumomediastinum extending into base of cervical region. Esophagus contains a small amount of refluxed contrast from the known hiatal hernia. No contrast extravasation identified. Lungs/Pleura: Extensive patchy BILATERAL airspace infiltrates consistent with  multifocal pneumonia. No pleural effusion or pneumothorax. Large bleb medial RIGHT lower lobe. Upper Abdomen: Prior gastric sleeve resection. Remaining visualized upper abdomen unremarkable. Musculoskeletal: No acute osseous findings. IMPRESSION: Again identified pneumomediastinum extending into base of cervical region. No contrast extravasation from the esophagus identified. Hiatal hernia. Extensive BILATERAL airspace infiltrates consistent with multifocal pneumonia. Prior gastric sleeve resection. Electronically Signed   By: Ulyses Southward M.D.   On: 02/05/2020 15:11   DG ESOPHAGUS W SINGLE CM (SOL OR THIN BA)  Result Date: 02/05/2020 CLINICAL DATA:  Pneumomediastinum on CT question esophageal perforation EXAM: ESOPHOGRAM/BARIUM SWALLOW TECHNIQUE: Single contrast examination was performed using water soluble contrast. FLUOROSCOPY TIME:  Fluoroscopy Time:  1 minutes 48 seconds Radiation Exposure Index (if provided by the fluoroscopic device): 49.5 mGy Number of Acquired Spot Images: multiple fluoroscopic screen captures COMPARISON:  CT angio chest 02/05/2020 FINDINGS: Patient swallowed Omnipaque 300 and imaging  was performed. Study performed with patient supine, LPO, and RPO with head of bed elevated 20 degrees Normal esophageal motility. Small hiatal hernia. Prominent gastroesophageal reflux to the level of the clavicular heads. No contrast extravasation identified. No areas of esophageal wall irregularity, narrowing, or intraluminal filling defects seen. IMPRESSION: No esophageal injuries identified. Hiatal hernia with significant gastroesophageal reflux. Electronically Signed   By: Ulyses Southward M.D.   On: 02/05/2020 15:03        Scheduled Meds: . vitamin C  500 mg Oral Daily  . baclofen  10 mg Oral TID  . baricitinib  4 mg Oral Daily  . bisacodyl  10 mg Rectal Once  . buPROPion  150 mg Oral BID  . cholecalciferol  1,000 Units Oral Daily  . dextromethorphan-guaiFENesin  1 tablet Oral BID  .  enoxaparin (LOVENOX) injection  40 mg Subcutaneous Q12H  . Fingolimod HCl  0.5 mg Oral Daily  . fluticasone  1 spray Each Nare Daily  . gabapentin  300 mg Oral TID  . loratadine  10 mg Oral Daily  . methylPREDNISolone (SOLU-MEDROL) injection  55 mg Intravenous Q12H  . ondansetron (ZOFRAN) IV  4 mg Intravenous Q6H  . pantoprazole (PROTONIX) IV  40 mg Intravenous Q12H  . sertraline  25 mg Oral BID  . zinc sulfate  220 mg Oral Daily   Continuous Infusions: . 0.9 % NaCl with KCl 20 mEq / L 100 mL/hr at 02/06/20 1716  . remdesivir 100 mg in NS 100 mL 100 mg (02/07/20 0933)     LOS: 3 days    Time spent: 35 minutes    Khamiyah Grefe D Sherryll Burger, DO [facility]  If 7PM-7AM, please contact night-coverage [email or web address] 02/07/2020, 10:47 AM

## 2020-02-08 DIAGNOSIS — J1282 Pneumonia due to coronavirus disease 2019: Secondary | ICD-10-CM

## 2020-02-08 DIAGNOSIS — R7401 Elevation of levels of liver transaminase levels: Secondary | ICD-10-CM

## 2020-02-08 DIAGNOSIS — U071 COVID-19: Principal | ICD-10-CM

## 2020-02-08 LAB — CBC WITH DIFFERENTIAL/PLATELET
Abs Immature Granulocytes: 0.3 10*3/uL — ABNORMAL HIGH (ref 0.00–0.07)
Basophils Absolute: 0 10*3/uL (ref 0.0–0.1)
Basophils Relative: 0 %
Eosinophils Absolute: 0 10*3/uL (ref 0.0–0.5)
Eosinophils Relative: 0 %
HCT: 36.3 % (ref 36.0–46.0)
Hemoglobin: 11.5 g/dL — ABNORMAL LOW (ref 12.0–15.0)
Immature Granulocytes: 3 %
Lymphocytes Relative: 5 %
Lymphs Abs: 0.5 10*3/uL — ABNORMAL LOW (ref 0.7–4.0)
MCH: 26.5 pg (ref 26.0–34.0)
MCHC: 31.7 g/dL (ref 30.0–36.0)
MCV: 83.6 fL (ref 80.0–100.0)
Monocytes Absolute: 0.6 10*3/uL (ref 0.1–1.0)
Monocytes Relative: 6 %
Neutro Abs: 9.5 10*3/uL — ABNORMAL HIGH (ref 1.7–7.7)
Neutrophils Relative %: 86 %
Platelets: 450 10*3/uL — ABNORMAL HIGH (ref 150–400)
RBC: 4.34 MIL/uL (ref 3.87–5.11)
RDW: 14.5 % (ref 11.5–15.5)
WBC: 11 10*3/uL — ABNORMAL HIGH (ref 4.0–10.5)
nRBC: 0 % (ref 0.0–0.2)

## 2020-02-08 LAB — COMPREHENSIVE METABOLIC PANEL
ALT: 46 U/L — ABNORMAL HIGH (ref 0–44)
AST: 30 U/L (ref 15–41)
Albumin: 2.6 g/dL — ABNORMAL LOW (ref 3.5–5.0)
Alkaline Phosphatase: 91 U/L (ref 38–126)
Anion gap: 9 (ref 5–15)
BUN: 16 mg/dL (ref 6–20)
CO2: 27 mmol/L (ref 22–32)
Calcium: 8.4 mg/dL — ABNORMAL LOW (ref 8.9–10.3)
Chloride: 100 mmol/L (ref 98–111)
Creatinine, Ser: 0.59 mg/dL (ref 0.44–1.00)
GFR calc Af Amer: >60 mL/min (ref >60)
GFR calc non Af Amer: >60 mL/min (ref >60)
Glucose, Bld: 102 mg/dL — ABNORMAL HIGH (ref 70–99)
Potassium: 4.2 mmol/L (ref 3.5–5.1)
Sodium: 136 mmol/L (ref 135–145)
Total Bilirubin: 0.6 mg/dL (ref 0.3–1.2)
Total Protein: 6.5 g/dL (ref 6.5–8.1)

## 2020-02-08 LAB — D-DIMER, QUANTITATIVE: D-Dimer, Quant: 1.15 ug/mL-FEU — ABNORMAL HIGH (ref 0.00–0.50)

## 2020-02-08 LAB — C-REACTIVE PROTEIN: CRP: 3.7 mg/dL — ABNORMAL HIGH (ref <1.0)

## 2020-02-08 MED ORDER — PANTOPRAZOLE SODIUM 40 MG PO TBEC
40.0000 mg | DELAYED_RELEASE_TABLET | Freq: Two times a day (BID) | ORAL | Status: DC
  Administered 2020-02-08 – 2020-02-15 (×13): 40 mg via ORAL
  Filled 2020-02-08 (×14): qty 1

## 2020-02-08 MED ORDER — BISACODYL 10 MG RE SUPP
10.0000 mg | Freq: Once | RECTAL | Status: DC
  Filled 2020-02-08: qty 1

## 2020-02-08 NOTE — Plan of Care (Signed)
  Problem: Education: Goal: Knowledge of risk factors and measures for prevention of condition will improve Outcome: Progressing   Problem: Coping: Goal: Psychosocial and spiritual needs will be supported Outcome: Progressing   Problem: Pain Managment: Goal: General experience of comfort will improve Outcome: Progressing   Problem: Safety: Goal: Ability to remain free from injury will improve Outcome: Progressing   Problem: Skin Integrity: Goal: Risk for impaired skin integrity will decrease Outcome: Progressing

## 2020-02-08 NOTE — Progress Notes (Signed)
    Subjective: No abdominal pain. Had a BM this morning. No nausea unless moving around. Interested in advancing diet slowly. Feels breathing is improved.   Objective: Vital signs in last 24 hours: Temp:  [98.1 F (36.7 C)-98.5 F (36.9 C)] 98.5 F (36.9 C) (08/30 0602) Pulse Rate:  [84-91] 86 (08/30 0602) Resp:  [18-24] 18 (08/30 0602) BP: (113-134)/(71-76) 130/74 (08/30 0602) SpO2:  [78 %-96 %] 93 % (08/30 0931)   General:   Alert and oriented, ill-appearing but pleasant Head:  Normocephalic and atraumatic. Abdomen:  Bowel sounds present, soft, non-tender, non-distended. No HSM or hernias noted. No rebound or guarding. No masses appreciated  Neurologic:  Alert and  oriented x4 Psych: Normal mood and affect.  Intake/Output from previous day: 08/29 0701 - 08/30 0700 In: 1439.8 [I.V.:1347.5; IV Piggyback:92.3] Out: 1350 [Urine:1350] Intake/Output this shift: No intake/output data recorded.  Lab Results: Recent Labs    02/06/20 0747 02/07/20 0839 02/08/20 0653  WBC 10.3 9.6 11.0*  HGB 11.5* 11.5* 11.5*  HCT 37.7 37.0 36.3  PLT 410* 444* 450*   BMET Recent Labs    02/06/20 0747 02/07/20 0839 02/08/20 0653  NA 140 139 136  K 3.8 4.1 4.2  CL 104 101 100  CO2 $Re'24 24 27  'Xlg$ GLUCOSE 96 101* 102*  BUN $Re'15 14 16  'UCo$ CREATININE 0.59 0.61 0.59  CALCIUM 8.4* 8.3* 8.4*   LFT Recent Labs    02/06/20 0747 02/07/20 0839 02/08/20 0653  PROT 6.8 6.7 6.5  ALBUMIN 2.7* 2.7* 2.6*  AST 53* 39 30  ALT 60* 51* 46*  ALKPHOS 106 94 91  BILITOT 0.8 1.0 0.6    Assessment: 49 year old female admitted with respiratory failure in setting of Covid pneumonia, found to have mildly elevated transaminases and alk phos, which continue to improve this hospitalization.   Significant reflux to level of clavicles on BPE this admission. Nausea only noted with movement; otherwise none. Desires to advance diet.   Elevated LFTs likely related to acute illness. We can follow her as outpatient  once she is improved.   Plan: Change PPI to orally, continue BID Soft diet Will sign off and arrange outpatient visit in a few weeks Please call with questions   Annitta Needs, PhD, ANP-BC [facility]    LOS: 4 days    02/08/2020, 11:08 AM

## 2020-02-08 NOTE — Progress Notes (Signed)
PROGRESS NOTE    Melinda Moore  WUJ:[id number] DOB: 10-12-1970 DOA: 02/04/2020 PCP: Babs Sciara, MD   Brief Narrative:  Per HPI: [name] a 49 y.o.femalewith medical history significant ofMS, hypertension, anxiety, depression, Bell's palsy, DVT in 2013, fibromyalgia, trigeminal neuralgia presenting with complaints of shortness of breath after recently testing positive for Covid on 8/23 during an urgent care visit.Patient reports 5-day history of shortness of breath, fatigue,nausea and poor oral intake. Denies fevers, cough, or chest pain. States she has had some slight vomiting at home but is mostly feeling nauseous and has lost her appetite. She experienced some epigastric abdominal discomfort when she vomited but denies abdominal pain otherwise. Denies diarrhea. She has not been vaccinated against Covid.  8/26:Patient has been admitted for acute hypoxemic respiratory failure in the setting of COVID-19 pneumonia. Oxygen supplementation requirements have increased since admission up to 8 L this morning. She does not appear to be quite short of breath however. Patient has been started on Solu-Medrol as well as remdesivir. Plan to add bariticinibtoday in order to assist further with treatment given high O2 requirements as well as increase in inflammatory markers. Procalcitonin low, therefore avoid antibiotics. Patient does have some nausea and vomiting likely related to Covid. Patient noted to have elevated alkaline phosphatase as well as GGT and minimal increase in lipase. Plan for right upper quadrant ultrasound today and maintain on clear liquids as well as IV fluid for now. Spoke with daughterAnaya [phone].  8/27:Patient continues to have worsening hypoxemia as well as elevated D-dimer. Lovenox changed to twice daily. CT chest angiogram with no sign of PE, but unfortunately there does appear to be a large pneumomediastinum. Discussed case with CT surgery with barium  esophagram performed with no contrast extravasation noted. Patient down to 5 L nasal cannula at this time and otherwise appears comfortable. She is still having some trouble with nausea and vomiting intermittently.  8/28:Patient noted to have persistent hypoxemia and oxygen demand seems to have worsened to 10 L this a.m. She is also had quite a bit of chest congestion as well as indigestion, nausea, and vomiting despite scheduling of Zofran and Phenergan as needed. Continue clear liquid diet for now. Question the presence of a microperforation that would have contributed to large pneumomediastinum. Fortunately, imaging with barium esophagram on 8/27 with no acute findings noted. Appreciate GI consultation for assistance in management of persistent GI symptoms as well as concern for microperforation.  8/29: Patient noted to have improvement in hypoxemia noted this morning.  She is down to 6 L nasal cannula and has not had a bowel movement in the last several days.  She denies any further nausea or vomiting and has been coughing up mucus.  Inflammatory markers downtrending.  8/30: Patient noted to still require 6 L nasal cannula oxygen this morning.  She is asking for suppository with no bowel movement noted yesterday.  Plan to advance diet after discussion with GI today.  No significant nausea or vomiting noted this morning.  Assessment & Plan:   Principal Problem:   Pneumonia due to COVID-19 virus Active Problems:   Elevated alkaline phosphatase level   Chronic pain syndrome   Acute hypoxemic respiratory failure (HCC)   Elevated transaminase level   Acute respiratory failure due to COVID-19 [facility])   Acute hypoxemic respiratory failure due to COVID-19 [facility])   Acute respiratory failure with hypoxia (HCC)  Acute hypoxemic respiratory failure secondary to COVID-19 viral pneumonia: Tested positive for Covid on 02/01/2020.  Febrile, tachycardic, and tachypneic. Oxygen saturation 86% on room  air. Currently satting 89-90% on 7 L supplemental oxygen. Chest x-ray showing developing infiltrate in the left mid to lower lung field and right lung base. Bacterial pneumonia less likely given no leukocytosis or procalcitonin elevation. Inflammatory markers elevated: D-dimer 1.62, LDH 361, ferritin 233, fibrinogen 762, and CRP 17.4. Lactic acid normal x2. -Remdesivir dosing per pharmacy -IV Solu-Medrol 0.5 mg/kg every 12 hours -Vitamin C, zinc, vitamin D -Antitussives as needed -Tylenol as needed -Daily CBC with differential, CMP, CRP, D-dimer -Encourage prone positioning -Incentive spirometry, flutter valve -Airborne and contact precautions -Continuous pulse ox -Supplemental oxygen as needed to keep oxygen saturation above 90% -Blood culture x2negative for 3 days -Plan to add Mucinex for chest congestion today  COVID-19 viral gastroenteritis:Patient reports having nausea and a few episodes of vomiting at home. Endorsing poor oral intake. Transaminases elevated in the setting of acute viral illness. T bili normal. Abdominal exam benign. -Gentle IV fluid hydration, antiemeticwill be scheduled every 6 hours for now -Appreciate GI ongoing evaluation and recommendations -Trial of suppository for constipation today  Large pneumomediastinum -Discussed case with CT surgery, esophagram and CT chest performed with no sign of extravasation noted -Advance to soft diet today. -Appreciate GI ongoing evaluation -PPI IV to twice daily per GI -Bedrest -Avoid incentive spirometry and flutter valve  Elevated transaminases:Likely due to COVID-19 viral infection. -Continue to monitor LFTs  Mildly elevated alkaline phosphatase level -GGT elevated, but no significant findings on right upper quadrant ultrasound aside from fatty liver disease  Chronic pain syndrome -Resume home Dilaudid as neededq6 hours for severe pain  Anxiety, depression -Resume home medications  MS -Resume  home medications as appropriate   DVT prophylaxis:Lovenox BID Code Status:Full Family Communication:Discussed with daughter 8/26, 8/27, 8/28, 8/29, 8/30 Disposition Plan: Status is: Inpatient  Remains inpatient appropriate because:IV treatments appropriate due to intensity of illness or inability to take PO and Inpatient level of care appropriate due to severity of illness   Dispo: The patient is from:Home Anticipated d/c is WU:[id number] Anticipated d/c date is: 2 days Patient currently is not medically stable to d/c.  Consultants:  Discussed with CT Surgery 8/27  GI  Procedures:  See below  Antimicrobials:  Anti-infectives (From admission, onward)   Start     Dose/Rate Route Frequency Ordered Stop   02/05/20 1000  remdesivir 100 mg in sodium chloride 0.9 % 100 mL IVPB        100 mg 200 mL/hr over 30 Minutes Intravenous Daily 02/04/20 0406 02/09/20 0959   02/04/20 0415  remdesivir 100 mg in sodium chloride 0.9 % 100 mL IVPB        100 mg 200 mL/hr over 30 Minutes Intravenous Every 30 min 02/04/20 0406 02/04/20 0522       Subjective: Patient seen and evaluated today with no new acute complaints or concerns. No acute concerns or events noted overnight.  She denies any significant nausea or vomiting.  Objective: Vitals:   02/07/20 1433 02/07/20 1744 02/07/20 2113 02/08/20 0602  BP: 113/75 116/71 134/76 130/74  Pulse: 84 91 89 86  Resp: (!) 24 (!) Temp: 98.2 F (36.8 C) 98.1 F (36.7 C) 98.4 F (36.9 C) 98.5 F (36.9 C)  TempSrc:      SpO2: 92% 91% 96% (!) 88%  Weight:      Height:        Intake/Output Summary (Last 24 hours) at 02/08/2020 0751 Last data filed at 02/08/2020  0500 Gross per 24 hour  Intake 1439.75 ml  Output 1350 ml  Net 89.75 ml   Filed Weights   02/04/20 0138  Weight: 113.3 kg    Examination:  General exam: Appears calm and comfortable  Respiratory system: Clear to  auscultation. Respiratory effort normal.  Currently on 6 L nasal cannula oxygen. Cardiovascular system: S1 & S2 heard, RRR.  Gastrointestinal system: Abdomen is nondistended, soft and nontender.  Central nervous system: Alert and oriented. No focal neurological deficits. Extremities: Symmetric 5 x 5 power. Skin: No rashes, lesions or ulcers Psychiatry: Judgement and insight appear normal. Mood & affect appropriate.     Data Reviewed: I have personally reviewed following labs and imaging studies  CBC: Recent Labs  Lab 02/04/20 0120 02/05/20 0555 02/06/20 0747 02/07/20 0839 02/08/20 0653  WBC 5.2 6.2 10.3 9.6 11.0*  NEUTROABS 4.6 4.8 8.9* 8.1* 9.5*  HGB 11.6* 11.0* 11.5* 11.5* 11.5*  HCT 37.2 34.9* 37.7 37.0 36.3  MCV 84.9 83.9 85.3 83.7 83.6  PLT 307 332 410* 444* 450*   Basic Metabolic Panel: Recent Labs  Lab 02/04/20 0120 02/05/20 0555 02/06/20 0747 02/07/20 0839 02/08/20 0653  NA 137 141 140 139 136  K 3.5 3.7 3.8 4.1 4.2  CL 98 106 104 101 100  CO2 24 24 24 24 27   GLUCOSE 113* 102* 96 101* 102*  BUN 16 14 15 14 16   CREATININE 1.01* 0.66 0.59 0.61 0.59  CALCIUM 8.6* 8.4* 8.4* 8.3* 8.4*   GFR: Estimated Creatinine Clearance: 112.4 mL/min (by C-G formula based on SCr of 0.59 mg/dL). Liver Function Tests: Recent Labs  Lab 02/04/20 0120 02/05/20 0555 02/06/20 0747 02/07/20 0839 02/08/20 0653  AST 100* 60* 53* 39 30  ALT 74* 56* 60* 51* 46*  ALKPHOS 132* 103 106 94 91  BILITOT 0.7 0.5 0.8 1.0 0.6  PROT 8.2* 6.7 6.8 6.7 6.5  ALBUMIN 3.5 2.7* 2.7* 2.7* 2.6*   Recent Labs  Lab 02/04/20 0327  LIPASE 126*   No results for input(s): AMMONIA in the last 168 hours. Coagulation Profile: No results for input(s): INR, PROTIME in the last 168 hours. Cardiac Enzymes: No results for input(s): CKTOTAL, CKMB, CKMBINDEX, TROPONINI in the last 168 hours. BNP (last 3 results) No results for input(s): PROBNP in the last 8760 hours. HbA1C: No results for input(s):  HGBA1C in the last 72 hours. CBG: No results for input(s): GLUCAP in the last 168 hours. Lipid Profile: No results for input(s): CHOL, HDL, LDLCALC, TRIG, CHOLHDL, LDLDIRECT in the last 72 hours. Thyroid Function Tests: No results for input(s): TSH, T4TOTAL, FREET4, T3FREE, THYROIDAB in the last 72 hours. Anemia Panel: No results for input(s): VITAMINB12, FOLATE, FERRITIN, TIBC, IRON, RETICCTPCT in the last 72 hours. Sepsis Labs: Recent Labs  Lab 02/04/20 0120 02/04/20 0327  PROCALCITON <0.10  --   LATICACIDVEN 1.6 1.0    Recent Results (from the past 240 hour(s))  Novel Coronavirus, NAA ([facility])     Status: Abnormal   Collection Time: 02/01/20  8:49 AM   Specimen: Nasopharyngeal Swab; Nasopharyngeal(NP) swabs in vial transport medium   Nasopharynge  Result Value Ref Range Status   SARS-CoV-2, NAA Detected (A) Not Detected Final    Comment: Patients who have a positive COVID-19 test result may now have treatment options. Treatment options are available for patients with mild to moderate symptoms and for hospitalized patients. Visit our website at 02/06/20 for resources and information. This nucleic acid amplification test was developed and its performance characteristics  determined by [employer]. Nucleic acid amplification tests include RT-PCR and TMA. This test has not been FDA cleared or approved. This test has been authorized by FDA under an Emergency Use Authorization (EUA). This test is only authorized for the duration of time the declaration that circumstances exist justifying the authorization of the emergency use of in vitro diagnostic tests for detection of SARS-CoV-2 virus and/or diagnosis of COVID-19 infection under section 564(b)(1) of the Act, 21 U.S.C. 098JXB-1(Y) (1), unless the authorization is terminated or revoked sooner. When diagnostic testing is negativ e, the possibility of a false negative result should be considered  in the context of a patient's recent exposures and the presence of clinical signs and symptoms consistent with COVID-19. An individual without symptoms of COVID-19 and who is not shedding SARS-CoV-2 virus would expect to have a negative (not detected) result in this assay.   SARS-COV-2, NAA 2 DAY TAT     Status: None   Collection Time: 02/01/20  8:49 AM   Nasopharynge  Result Value Ref Range Status   SARS-CoV-2, NAA 2 DAY TAT Performed  Final  Blood Culture (routine x 2)     Status: None (Preliminary result)   Collection Time: 02/04/20  1:20 AM   Specimen: Right Antecubital; Blood  Result Value Ref Range Status   Specimen Description RIGHT ANTECUBITAL  Final   Special Requests   Final    BOTTLES DRAWN AEROBIC AND ANAEROBIC Blood Culture adequate volume   Culture   Final    NO GROWTH 4 DAYS Performed at [facility], [address] [place], [place] [address]    Report Status PENDING  Incomplete  Blood Culture (routine x 2)     Status: None (Preliminary result)   Collection Time: 02/04/20  3:27 AM   Specimen: BLOOD LEFT ARM  Result Value Ref Range Status   Specimen Description BLOOD LEFT ARM  Final   Special Requests   Final    BOTTLES DRAWN AEROBIC AND ANAEROBIC Blood Culture adequate volume   Culture   Final    NO GROWTH 4 DAYS Performed at [facility], [address], [place], [place] [address]    Report Status PENDING  Incomplete         Radiology Studies: No results found.      Scheduled Meds: . vitamin C  500 mg Oral Daily  . baclofen  10 mg Oral TID  . baricitinib  4 mg Oral Daily  . bisacodyl  10 mg Rectal Once  . buPROPion  150 mg Oral BID  . cholecalciferol  1,000 Units Oral Daily  . dextromethorphan-guaiFENesin  1 tablet Oral BID  . enoxaparin (LOVENOX) injection  40 mg Subcutaneous Q12H  . Fingolimod HCl  0.5 mg Oral Daily  . fluticasone  1 spray Each Nare Daily  . gabapentin  300 mg Oral TID  . loratadine  10 mg Oral Daily  . mouth rinse  15 mL  Mouth Rinse BID  . methylPREDNISolone (SOLU-MEDROL) injection  55 mg Intravenous Q12H  . ondansetron (ZOFRAN) IV  4 mg Intravenous Q6H  . pantoprazole (PROTONIX) IV  40 mg Intravenous Q12H  . sertraline  25 mg Oral BID  . zinc sulfate  220 mg Oral Daily   Continuous Infusions: . lactated ringers 75 mL/hr at 02/07/20 2350  . remdesivir 100 mg in NS 100 mL Stopped (02/07/20 1005)     LOS: 4 days    Time spent: 35 minutes    Paytan Recine Hoover Brunette, DO  [facility]  If 7PM-7AM, please contact night-coverage [email or web address] 02/08/2020, 7:51 AM

## 2020-02-09 LAB — CULTURE, BLOOD (ROUTINE X 2)
Culture: NO GROWTH
Culture: NO GROWTH
Special Requests: ADEQUATE
Special Requests: ADEQUATE

## 2020-02-09 LAB — COMPREHENSIVE METABOLIC PANEL
ALT: 42 U/L (ref 0–44)
AST: 33 U/L (ref 15–41)
Albumin: 2.5 g/dL — ABNORMAL LOW (ref 3.5–5.0)
Alkaline Phosphatase: 87 U/L (ref 38–126)
Anion gap: 13 (ref 5–15)
BUN: 15 mg/dL (ref 6–20)
CO2: 24 mmol/L (ref 22–32)
Calcium: 8.3 mg/dL — ABNORMAL LOW (ref 8.9–10.3)
Chloride: 99 mmol/L (ref 98–111)
Creatinine, Ser: 0.61 mg/dL (ref 0.44–1.00)
GFR calc Af Amer: >60 mL/min (ref >60)
GFR calc non Af Amer: >60 mL/min (ref >60)
Glucose, Bld: 93 mg/dL (ref 70–99)
Potassium: 3.5 mmol/L (ref 3.5–5.1)
Sodium: 136 mmol/L (ref 135–145)
Total Bilirubin: 0.9 mg/dL (ref 0.3–1.2)
Total Protein: 6.5 g/dL (ref 6.5–8.1)

## 2020-02-09 LAB — CBC WITH DIFFERENTIAL/PLATELET
Abs Immature Granulocytes: 0.45 10*3/uL — ABNORMAL HIGH (ref 0.00–0.07)
Basophils Absolute: 0 10*3/uL (ref 0.0–0.1)
Basophils Relative: 0 %
Eosinophils Absolute: 0 10*3/uL (ref 0.0–0.5)
Eosinophils Relative: 0 %
HCT: 36.3 % (ref 36.0–46.0)
Hemoglobin: 11.6 g/dL — ABNORMAL LOW (ref 12.0–15.0)
Immature Granulocytes: 3 %
Lymphocytes Relative: 4 %
Lymphs Abs: 0.5 10*3/uL — ABNORMAL LOW (ref 0.7–4.0)
MCH: 26.7 pg (ref 26.0–34.0)
MCHC: 32 g/dL (ref 30.0–36.0)
MCV: 83.4 fL (ref 80.0–100.0)
Monocytes Absolute: 0.8 10*3/uL (ref 0.1–1.0)
Monocytes Relative: 6 %
Neutro Abs: 11.7 10*3/uL — ABNORMAL HIGH (ref 1.7–7.7)
Neutrophils Relative %: 87 %
Platelets: 472 10*3/uL — ABNORMAL HIGH (ref 150–400)
RBC: 4.35 MIL/uL (ref 3.87–5.11)
RDW: 14.3 % (ref 11.5–15.5)
WBC: 13.5 10*3/uL — ABNORMAL HIGH (ref 4.0–10.5)
nRBC: 0 % (ref 0.0–0.2)

## 2020-02-09 LAB — D-DIMER, QUANTITATIVE: D-Dimer, Quant: 0.95 ug/mL-FEU — ABNORMAL HIGH (ref 0.00–0.50)

## 2020-02-09 LAB — C-REACTIVE PROTEIN: CRP: 7.1 mg/dL — ABNORMAL HIGH (ref <1.0)

## 2020-02-09 NOTE — Progress Notes (Signed)
PROGRESS NOTE    Melinda Moore  HYQ:[id number] DOB: 09-Nov-1970 DOA: 02/04/2020 PCP: Babs Sciara, MD   Brief Narrative:  Per HPI: Melinda Moore a 49 y.o.femalewith medical history significant ofMS, hypertension, anxiety, depression, Bell's palsy, DVT in 2013, fibromyalgia, trigeminal neuralgia presenting with complaints of shortness of breath after recently testing positive for Covid on 8/23 during an urgent care visit.Patient reports 5-day history of shortness of breath, fatigue,nausea and poor oral intake. Denies fevers, cough, or chest pain. States she has had some slight vomiting at home but is mostly feeling nauseous and has lost her appetite. She experienced some epigastric abdominal discomfort when she vomited but denies abdominal pain otherwise. Denies diarrhea. She has not been vaccinated against Covid.  8/26:Patient has been admitted for acute hypoxemic respiratory failure in the setting of COVID-19 pneumonia. Oxygen supplementation requirements have increased since admission up to 8 L this morning. She does not appear to be quite short of breath however. Patient has been started on Solu-Medrol as well as remdesivir. Plan to add bariticinibtoday in order to assist further with treatment given high O2 requirements as well as increase in inflammatory markers. Procalcitonin low, therefore avoid antibiotics. Patient does have some nausea and vomiting likely related to Covid. Patient noted to have elevated alkaline phosphatase as well as GGT and minimal increase in lipase. Plan for right upper quadrant ultrasound today and maintain on clear liquids as well as IV fluid for now. Spoke with daughterAnaya [phone].  8/27:Patient continues to have worsening hypoxemia as well as elevated D-dimer. Lovenox changed to twice daily. CT chest angiogram with no sign of PE, but unfortunately there does appear to be a large pneumomediastinum. Discussed case with CT surgery with barium  esophagram performed with no contrast extravasation noted. Patient down to 5 L nasal cannula at this time and otherwise appears comfortable. She is still having some trouble with nausea and vomiting intermittently.  8/28:Patient noted to have persistent hypoxemia and oxygen demand seems to have worsened to 10 L this a.m. She is also had quite a bit of chest congestion as well as indigestion, nausea, and vomiting despite scheduling of Zofran and Phenergan as needed. Continue clear liquid diet for now. Question the presence of a microperforation that would have contributed to large pneumomediastinum. Fortunately, imaging with barium esophagram on 8/27 with no acute findings noted. Appreciate GI consultation for assistance in management of persistent GI symptoms as well as concern for microperforation.  8/29:Patient noted to have improvement in hypoxemia noted this morning. She is down to 6 L nasal cannula and has not had a bowel movement in the last several days. She denies any further nausea or vomiting and has been coughing up mucus. Inflammatory markers downtrending.  8/30: Patient noted to still require 6 L nasal cannula oxygen this morning.  She is asking for suppository with no bowel movement noted yesterday.  Plan to advance diet after discussion with GI today.  No significant nausea or vomiting noted this morning.  8/31: Patient is eager for discharge, but continues to remain on 6 L nasal cannula oxygen.  She is able to tolerate her soft diet and has been transitioned to oral PPI twice daily by GI.  Continue on soft diet and plan to ambulate further today and wean oxygen as tolerated.  Continue to monitor CRP levels.  Assessment & Plan:   Principal Problem:   Pneumonia due to COVID-19 virus Active Problems:   Elevated alkaline phosphatase level   Chronic pain syndrome  Acute hypoxemic respiratory failure (HCC)   Elevated transaminase level   Acute respiratory failure due to  COVID-19 [facility])   Acute hypoxemic respiratory failure due to COVID-19 [facility])   Acute respiratory failure with hypoxia (HCC)   Acute hypoxemic respiratory failure secondary to COVID-19 viral pneumonia: Tested positive for Covid on 02/01/2020. Febrile, tachycardic, and tachypneic. Oxygen saturation 86% on room air. Currently satting 89-90% on 7 L supplemental oxygen. Chest x-ray showing developing infiltrate in the left mid to lower lung field and right lung base. Bacterial pneumonia less likely given no leukocytosis or procalcitonin elevation. Inflammatory markers elevated: D-dimer 1.62, LDH 361, ferritin 233, fibrinogen 762, and CRP 17.4. Lactic acid normal x2. -Remdesivir dosing per pharmacy -IV Solu-Medrol 0.5 mg/kg every 12 hours -Vitamin C, zinc, vitamin D -Antitussives as needed -Tylenol as needed -Daily CBC with differential, CMP, CRP, D-dimer -Encourage prone positioning -Incentive spirometry, flutter valve -Airborne and contact precautions -Continuous pulse ox -Supplemental oxygen as needed to keep oxygen saturation above 90% -Blood culture x2negative for3days -Continue Mucinex for chest congestion -CRP levels upward trending, continue monitoring in a.m. -Plan to wean oxygen levels and ambulate further on 8/31, consider discharge of nasal cannula down to 4 L  COVID-19 viral gastroenteritis:Patient reports having nausea and a few episodes of vomiting at home. Endorsing poor oral intake. Transaminases elevated in the setting of acute viral illness. T bili normal. Abdominal exam benign. -Appreciate GI evaluation with transition to oral PPI twice daily -Continue on soft diet even on discharge -Continue IV fluid through today until dietary intake stabilizes further -GI plans to follow-up in outpatient setting  Large pneumomediastinum -Discussed case with CT surgery, esophagram and CT chest performed with no sign of extravasation noted -Continue soft diet -Okay to  ambulate at this point -Avoid incentive spirometry and flutter valve -Plan to follow-up with CT surgery in outpatient setting for monitoring, no need for surgical intervention  Elevated transaminases:Likely due to COVID-19 viral infection. -Continue to monitor LFTs  Mildly elevated alkaline phosphatase level -GGT elevated, but no significant findings on right upper quadrant ultrasound aside from fatty liver disease  Chronic pain syndrome -Resume home Dilaudid as neededq6 hours for severe pain  Anxiety, depression -Continue home medications  MS -Continue home medications   DVT prophylaxis:Lovenox BID Code Status:Full Family Communication:Discussed with daughter [phone]/26, 8/27, 8/28, 8/29, 8/30, 8/31 Disposition Plan: Status is: Inpatient  Remains inpatient appropriate because:IV treatments appropriate due to intensity of illness or inability to take PO and Inpatient level of care appropriate due to severity of illness   Dispo: The patient is from:Home Anticipated d/c is VO:[facility] Anticipated d/c date is: 1-2 days Patient currently is not medically stable to d/c.  Consultants:  Discussed with CT Surgery 8/27  GI, now signed off  Procedures:  See below  Antimicrobials:  Anti-infectives (From admission, onward)   Start     Dose/Rate Route Frequency Ordered Stop   02/05/20 1000  remdesivir 100 mg in sodium chloride 0.9 % 100 mL IVPB        100 mg 200 mL/hr over 30 Minutes Intravenous Daily 02/04/20 0406 02/08/20 1014   02/04/20 0415  remdesivir 100 mg in sodium chloride 0.9 % 100 mL IVPB        100 mg 200 mL/hr over 30 Minutes Intravenous Every 30 min 02/04/20 0406 02/04/20 0522      Subjective: Patient seen and evaluated today with no new acute complaints or concerns. No acute concerns or events noted overnight.  She is eager to  go home, but has been tolerating her diet without any further nausea  or vomiting.  Objective: Vitals:   02/08/20 1320 02/08/20 1657 02/08/20 2202 02/09/20 0606  BP: 112/72  110/62 114/62  Pulse: 97  (!) 101 (!) 102  Resp: (!) 22  18 18   Temp: 98.3 F (36.8 C)  97.8 F (36.6 C) 98.6 F (37 C)  TempSrc: Oral     SpO2: 93% 93% 93% 93%  Weight:      Height:        Intake/Output Summary (Last 24 hours) at 02/09/2020 0929 Last data filed at 02/09/2020 0615 Gross per 24 hour  Intake 1005 ml  Output 1950 ml  Net -945 ml   Filed Weights   02/04/20 0138  Weight: 113.3 kg    Examination:  General exam: Appears calm and comfortable  Respiratory system: Clear to auscultation. Respiratory effort normal.  Currently on 6 L nasal cannula oxygen. Cardiovascular system: S1 & S2 heard, RRR.  Gastrointestinal system: Abdomen is nondistended, soft and nontender.  Central nervous system: Alert and oriented. No focal neurological deficits. Extremities: Symmetric 5 x 5 power. Skin: No rashes, lesions or ulcers Psychiatry: Flat affect.    Data Reviewed: I have personally reviewed following labs and imaging studies  CBC: Recent Labs  Lab 02/05/20 0555 02/06/20 0747 02/07/20 0839 02/08/20 0653 02/09/20 0637  WBC 6.2 10.3 9.6 11.0* 13.5*  NEUTROABS 4.8 8.9* 8.1* 9.5* 11.7*  HGB 11.0* 11.5* 11.5* 11.5* 11.6*  HCT 34.9* 37.7 37.0 36.3 36.3  MCV 83.9 85.3 83.7 83.6 83.4  PLT 332 410* 444* 450* 472*   Basic Metabolic Panel: Recent Labs  Lab 02/05/20 0555 02/06/20 0747 02/07/20 0839 02/08/20 0653 02/09/20 0637  NA 141 140 139 136 136  K 3.7 3.8 4.1 4.2 3.5  CL 106 104 101 100 99  CO2 24 24 24 27 24   GLUCOSE 102* 96 101* 102* 93  BUN 14 15 14 16 15   CREATININE 0.66 0.59 0.61 0.59 0.61  CALCIUM 8.4* 8.4* 8.3* 8.4* 8.3*   GFR: Estimated Creatinine Clearance: 112.4 mL/min (by C-G formula based on SCr of 0.61 mg/dL). Liver Function Tests: Recent Labs  Lab 02/05/20 0555 02/06/20 0747 02/07/20 0839 02/08/20 0653 02/09/20 0637  AST 60* 53*  39 30 33  ALT 56* 60* 51* 46* 42  ALKPHOS 103 106 94 91 87  BILITOT 0.5 0.8 1.0 0.6 0.9  PROT 6.7 6.8 6.7 6.5 6.5  ALBUMIN 2.7* 2.7* 2.7* 2.6* 2.5*   Recent Labs  Lab 02/04/20 0327  LIPASE 126*   No results for input(s): AMMONIA in the last 168 hours. Coagulation Profile: No results for input(s): INR, PROTIME in the last 168 hours. Cardiac Enzymes: No results for input(s): CKTOTAL, CKMB, CKMBINDEX, TROPONINI in the last 168 hours. BNP (last 3 results) No results for input(s): PROBNP in the last 8760 hours. HbA1C: No results for input(s): HGBA1C in the last 72 hours. CBG: No results for input(s): GLUCAP in the last 168 hours. Lipid Profile: No results for input(s): CHOL, HDL, LDLCALC, TRIG, CHOLHDL, LDLDIRECT in the last 72 hours. Thyroid Function Tests: No results for input(s): TSH, T4TOTAL, FREET4, T3FREE, THYROIDAB in the last 72 hours. Anemia Panel: No results for input(s): VITAMINB12, FOLATE, FERRITIN, TIBC, IRON, RETICCTPCT in the last 72 hours. Sepsis Labs: Recent Labs  Lab 02/04/20 0120 02/04/20 0327  PROCALCITON <0.10  --   LATICACIDVEN 1.6 1.0    Recent Results (from the past 240 hour(s))  Novel Coronavirus, NAA ([facility])  Status: Abnormal   Collection Time: 02/01/20  8:49 AM   Specimen: Nasopharyngeal Swab; Nasopharyngeal(NP) swabs in vial transport medium   Nasopharynge  Result Value Ref Range Status   SARS-CoV-2, NAA Detected (A) Not Detected Final    Comment: Patients who have a positive COVID-19 test result may now have treatment options. Treatment options are available for patients with mild to moderate symptoms and for hospitalized patients. Visit our website at [email or web address] for resources and information. This nucleic acid amplification test was developed and its performance characteristics determined by [employer]. Nucleic acid amplification tests include RT-PCR and TMA. This test has not been FDA cleared or  approved. This test has been authorized by FDA under an Emergency Use Authorization (EUA). This test is only authorized for the duration of time the declaration that circumstances exist justifying the authorization of the emergency use of in vitro diagnostic tests for detection of SARS-CoV-2 virus and/or diagnosis of COVID-19 infection under section 564(b)(1) of the Act, 21 U.S.C. 591MBW-4(Y) (1), unless the authorization is terminated or revoked sooner. When diagnostic testing is negativ e, the possibility of a false negative result should be considered in the context of a patient's recent exposures and the presence of clinical signs and symptoms consistent with COVID-19. An individual without symptoms of COVID-19 and who is not shedding SARS-CoV-2 virus would expect to have a negative (not detected) result in this assay.   SARS-COV-2, NAA 2 DAY TAT     Status: None   Collection Time: 02/01/20  8:49 AM   Nasopharynge  Result Value Ref Range Status   SARS-CoV-2, NAA 2 DAY TAT Performed  Final  Blood Culture (routine x 2)     Status: None   Collection Time: 02/04/20  1:20 AM   Specimen: Right Antecubital; Blood  Result Value Ref Range Status   Specimen Description RIGHT ANTECUBITAL  Final   Special Requests   Final    BOTTLES DRAWN AEROBIC AND ANAEROBIC Blood Culture adequate volume   Culture   Final    NO GROWTH 5 DAYS Performed at [facility], [address], [place], [place] [address]    Report Status 02/09/2020 FINAL  Final  Blood Culture (routine x 2)     Status: None   Collection Time: 02/04/20  3:27 AM   Specimen: BLOOD LEFT ARM  Result Value Ref Range Status   Specimen Description BLOOD LEFT ARM  Final   Special Requests   Final    BOTTLES DRAWN AEROBIC AND ANAEROBIC Blood Culture adequate volume   Culture   Final    NO GROWTH 5 DAYS Performed at [facility], [address] [place], [place] [address]    Report Status 02/09/2020 FINAL  Final         Radiology  Studies: No results found.      Scheduled Meds: . vitamin C  500 mg Oral Daily  . baclofen  10 mg Oral TID  . baricitinib  4 mg Oral Daily  . bisacodyl  10 mg Rectal Once  . bisacodyl  10 mg Rectal Once  . buPROPion  150 mg Oral BID  . cholecalciferol  1,000 Units Oral Daily  . dextromethorphan-guaiFENesin  1 tablet Oral BID  . enoxaparin (LOVENOX) injection  40 mg Subcutaneous Q12H  . Fingolimod HCl  0.5 mg Oral Daily  . fluticasone  1 spray Each Nare Daily  . gabapentin  300 mg Oral TID  . loratadine  10 mg Oral Daily  . mouth rinse  15 mL Mouth Rinse BID  . methylPREDNISolone (SOLU-MEDROL) injection  55 mg Intravenous Q12H  . ondansetron (ZOFRAN) IV  4 mg Intravenous Q6H  . pantoprazole  40 mg Oral BID AC  . sertraline  25 mg Oral BID  . zinc sulfate  220 mg Oral Daily   Continuous Infusions: . lactated ringers 75 mL/hr at 02/09/20 0334     LOS: 5 days    Time spent: 30 minutes    Augustus Zurawski Hoover Brunette, DO [facility]  If 7PM-7AM, please contact night-coverage [email or web address] 02/09/2020, 9:29 AM

## 2020-02-10 ENCOUNTER — Encounter (HOSPITAL_COMMUNITY): Payer: Self-pay | Admitting: Family Medicine

## 2020-02-10 ENCOUNTER — Inpatient Hospital Stay (HOSPITAL_COMMUNITY): Payer: 59

## 2020-02-10 DIAGNOSIS — J9601 Acute respiratory failure with hypoxia: Secondary | ICD-10-CM

## 2020-02-10 LAB — CBC
HCT: 37 % (ref 36.0–46.0)
Hemoglobin: 11.5 g/dL — ABNORMAL LOW (ref 12.0–15.0)
MCH: 26 pg (ref 26.0–34.0)
MCHC: 31.1 g/dL (ref 30.0–36.0)
MCV: 83.7 fL (ref 80.0–100.0)
Platelets: 521 10*3/uL — ABNORMAL HIGH (ref 150–400)
RBC: 4.42 MIL/uL (ref 3.87–5.11)
RDW: 14.3 % (ref 11.5–15.5)
WBC: 15.8 10*3/uL — ABNORMAL HIGH (ref 4.0–10.5)
nRBC: 0 % (ref 0.0–0.2)

## 2020-02-10 LAB — COMPREHENSIVE METABOLIC PANEL
ALT: 35 U/L (ref 0–44)
AST: 23 U/L (ref 15–41)
Albumin: 2.6 g/dL — ABNORMAL LOW (ref 3.5–5.0)
Alkaline Phosphatase: 84 U/L (ref 38–126)
Anion gap: 14 (ref 5–15)
BUN: 17 mg/dL (ref 6–20)
CO2: 25 mmol/L (ref 22–32)
Calcium: 8.5 mg/dL — ABNORMAL LOW (ref 8.9–10.3)
Chloride: 98 mmol/L (ref 98–111)
Creatinine, Ser: 0.59 mg/dL (ref 0.44–1.00)
GFR calc Af Amer: >60 mL/min (ref >60)
GFR calc non Af Amer: >60 mL/min (ref >60)
Glucose, Bld: 98 mg/dL (ref 70–99)
Potassium: 3.8 mmol/L (ref 3.5–5.1)
Sodium: 137 mmol/L (ref 135–145)
Total Bilirubin: 0.6 mg/dL (ref 0.3–1.2)
Total Protein: 6.4 g/dL — ABNORMAL LOW (ref 6.5–8.1)

## 2020-02-10 LAB — MRSA PCR SCREENING: MRSA by PCR: NEGATIVE

## 2020-02-10 LAB — C-REACTIVE PROTEIN: CRP: 7.6 mg/dL — ABNORMAL HIGH (ref <1.0)

## 2020-02-10 LAB — D-DIMER, QUANTITATIVE: D-Dimer, Quant: 0.9 ug/mL-FEU — ABNORMAL HIGH (ref 0.00–0.50)

## 2020-02-10 MED ORDER — IPRATROPIUM-ALBUTEROL 20-100 MCG/ACT IN AERS
2.0000 | INHALATION_SPRAY | Freq: Four times a day (QID) | RESPIRATORY_TRACT | Status: DC
  Administered 2020-02-11 – 2020-02-15 (×15): 2 via RESPIRATORY_TRACT

## 2020-02-10 MED ORDER — IPRATROPIUM-ALBUTEROL 20-100 MCG/ACT IN AERS
2.0000 | INHALATION_SPRAY | Freq: Four times a day (QID) | RESPIRATORY_TRACT | Status: DC
  Administered 2020-02-10 (×2): 2 via RESPIRATORY_TRACT
  Filled 2020-02-10: qty 4

## 2020-02-10 MED ORDER — CHLORHEXIDINE GLUCONATE CLOTH 2 % EX PADS
6.0000 | MEDICATED_PAD | Freq: Every day | CUTANEOUS | Status: DC
  Administered 2020-02-10 – 2020-02-14 (×5): 6 via TOPICAL

## 2020-02-10 NOTE — Progress Notes (Signed)
   02/10/20 1400  Assess: MEWS Score  BP 98/61  Pulse Rate (!) 104  Resp (!) 28  Level of Consciousness Alert  SpO2 92 %  O2 Device HFNC  Patient Activity (if Appropriate) In bed  O2 Flow Rate (L/min) 10 L/min  Assess: MEWS Score  MEWS Temp 0  MEWS Systolic 1  MEWS Pulse 1  MEWS RR 2  MEWS LOC 0  MEWS Score 4  MEWS Score Color Red  Assess: if the MEWS score is Yellow or Red  Were vital signs taken at a resting state? Yes  Focused Assessment Change from prior assessment (see assessment flowsheet)  Early Detection of Sepsis Score *See Row Information* Medium  MEWS guidelines implemented *See Row Information* Yes  Treat  MEWS Interventions Other (Comment);Consulted Respiratory Therapy;Escalated (See documentation below) Psychologist, occupational and provider notified)  Take Vital Signs  Increase Vital Sign Frequency  Red: Q 1hr X 4 then Q 4hr X 4, if remains red, continue Q 4hrs  Escalate  MEWS: Escalate Red: discuss with charge nurse/RN and provider, consider discussing with RRT  Notify: Charge Nurse/RN  Name of Charge Nurse/RN Notified morgan taylor, RN/Nicki Gracy, RN  Date Charge Nurse/RN Notified 02/10/20  Time Charge Nurse/RN Notified 1405  Notify: Provider  Provider Name/Title clanford Laural Benes, MD  Date Provider Notified 02/10/20  Time Provider Notified 1405  Notification Type Page  Notification Reason Change in status  Response See new orders (transfer to stepdown)  Date of Provider Response 02/10/20  Time of Provider Response 1405  Notify: Rapid Response  Name of Rapid Response RN Notified lawerence hylton, RN  Date Rapid Response Notified 02/10/20  Time Rapid Response Notified 1405  Document  Patient Outcome Transferred/level of care increased  Progress note created (see row info) Yes

## 2020-02-10 NOTE — Progress Notes (Signed)
1400: MD Johnson up to room to evaluate pt. See documented VS. CXR ordered along with orders to transfer pt to stepdown ICU.  1415: CXP completed and pt notified of pending transfer. 1430: Pt moved via stretcher to ICU bed 09 by 300 unit staff. Bedside handoff given to Marisue Ivan, RN in ICU.

## 2020-02-10 NOTE — Progress Notes (Addendum)
PROGRESS NOTE    Melinda Moore  WER:[id number] DOB: 06-Sep-1970 DOA: 02/04/2020 PCP: Babs Sciara, MD   Brief Narrative:  Per HPI: Melinda Moore a 49 y.o.femalewith medical history significant ofMS, hypertension, anxiety, depression, Bell's palsy, DVT in 2013, fibromyalgia, trigeminal neuralgia presenting with complaints of shortness of breath after recently testing positive for Covid on 8/23 during an urgent care visit.Patient reports 5-day history of shortness of breath, fatigue,nausea and poor oral intake. Denies fevers, cough, or chest pain. States she has had some slight vomiting at home but is mostly feeling nauseous and has lost her appetite. She experienced some epigastric abdominal discomfort when she vomited but denies abdominal pain otherwise. Denies diarrhea. She has not been vaccinated against Covid.  8/26:Patient has been admitted for acute hypoxemic respiratory failure in the setting of COVID-19 pneumonia. Oxygen supplementation requirements have increased since admission up to 8 L this morning. She does not appear to be quite short of breath however. Patient has been started on Solu-Medrol as well as remdesivir. Plan to add bariticinibtoday in order to assist further with treatment given high O2 requirements as well as increase in inflammatory markers. Procalcitonin low, therefore avoid antibiotics. Patient does have some nausea and vomiting likely related to Covid. Patient noted to have elevated alkaline phosphatase as well as GGT and minimal increase in lipase. Plan for right upper quadrant ultrasound today and maintain on clear liquids as well as IV fluid for now. Spoke with daughterAnaya [phone].  8/27:Patient continues to have worsening hypoxemia as well as elevated D-dimer. Lovenox changed to twice daily. CT chest angiogram with no sign of PE, but unfortunately there does appear to be a large pneumomediastinum. Discussed case with CT surgery with barium  esophagram performed with no contrast extravasation noted. Patient down to 5 L nasal cannula at this time and otherwise appears comfortable. She is still having some trouble with nausea and vomiting intermittently.  8/28:Patient noted to have persistent hypoxemia and oxygen demand seems to have worsened to 10 L this a.m. She is also had quite a bit of chest congestion as well as indigestion, nausea, and vomiting despite scheduling of Zofran and Phenergan as needed. Continue clear liquid diet for now. Question the presence of a microperforation that would have contributed to large pneumomediastinum. Fortunately, imaging with barium esophagram on 8/27 with no acute findings noted. Appreciate GI consultation for assistance in management of persistent GI symptoms as well as concern for microperforation.  8/29:Patient noted to have improvement in hypoxemia noted this morning. She is down to 6 L nasal cannula and has not had a bowel movement in the last several days. She denies any further nausea or vomiting and has been coughing up mucus. Inflammatory markers downtrending.  8/30: Patient noted to still require 6 L nasal cannula oxygen this morning.  She is asking for suppository with no bowel movement noted yesterday.  Plan to advance diet after discussion with GI today.  No significant nausea or vomiting noted this morning.  8/31: Patient is eager for discharge, but continues to remain on 6 L nasal cannula oxygen.  She is able to tolerate her soft diet and has been transitioned to oral PPI twice daily by GI.  Continue on soft diet and plan to ambulate further today and wean oxygen as tolerated.  Continue to monitor CRP levels.  9/1:  Pt was going to be discharged, however she became severely dyspneic and tachycardic when home O2 eval was being performed. DC placed on hold.  Pt now on 4L [place].   ** Update: Pt experienced further respiratory distress now on 10L HFNC.  She is transferred to stepdown  ICU.  I called and spoke with husband Mr. Melinda Moore for update on change in status.  He wanted to know what treatments she was on and I told him.  He verbalized understanding.  Repeat CXR was ordered and viewed by me.  2nd bedside visit to reassess.  I also conferenced with RT.  No IS, flutter valve or chest PT for now due to pneumomediastinum.    Assessment & Plan:   Principal Problem:   Pneumonia due to COVID-19 virus Active Problems:   Elevated alkaline phosphatase level   Chronic pain syndrome   Acute hypoxemic respiratory failure (HCC)   Elevated transaminase level   Acute respiratory failure due to COVID-19 [facility])   Acute hypoxemic respiratory failure due to COVID-19 [facility])   Acute respiratory failure with hypoxia (HCC)   Acute hypoxemic respiratory failure secondary to COVID-19 viral pneumonia: Tested positive for Covid on 02/01/2020. Febrile, tachycardic, and tachypneic. Oxygen saturation 86% on room air. Currently satting 89-90% on 7 L supplemental oxygen. Chest x-ray showing developing infiltrate in the left mid to lower lung field and right lung base. Bacterial pneumonia less likely given no leukocytosis or procalcitonin elevation. Inflammatory markers elevated: D-dimer 1.62, LDH 361, ferritin 233, fibrinogen 762, and CRP 17.4. Lactic acid normal x2. -Remdesivir dosing per pharmacy -IV Solu-Medrol 0.5 mg/kg every 12 hours -Vitamin C, zinc, vitamin D -Antitussives as needed -continue baricitinib as ordered.  -Tylenol as needed -Daily CBC with differential, CMP, CRP, D-dimer -Encourage prone positioning -Incentive spirometry, flutter valve -Airborne and contact precautions -Continuous pulse ox -Supplemental oxygen as needed to keep oxygen saturation above 90% -Blood culture x2negative to date -Continue Mucinex for chest congestion -CRP levels upward trending, continue monitoring in a.m. -Plan to wean oxygen levels and ambulate further as able.  I have asked for a PT evaluation.   Continue supplemental oxygen.     COVID-19 viral gastroenteritis:Patient reports having nausea and a few episodes of vomiting at home. Endorsing poor oral intake. Transaminases elevated in the setting of acute viral illness. T bili normal. Abdominal exam benign. -Appreciate GI evaluation with transition to oral PPI twice daily -Continue on soft diet even on discharge -GI plans to follow-up in outpatient setting  Large pneumomediastinum -Discussed case with CT surgery, esophagram and CT chest performed with no sign of extravasation noted -Continue soft diet -Okay to ambulate at this point -Avoid incentive spirometry and flutter valve -Plan for now is to follow-up with CT surgery in outpatient setting for monitoring, no need for surgical intervention  Elevated transaminases:Likely due to COVID-19 viral infection. -Continue to monitor LFTs  Mildly elevated alkaline phosphatase level -GGT elevated, but no significant findings on right upper quadrant ultrasound aside from fatty liver disease  Chronic pain syndrome -Resume home Dilaudid as neededq6 hours for severe pain  Anxiety, depression -Continue home medications  MS -Continue home medications  DVT prophylaxis:Lovenox BID Code Status:Full Family Communication:Discussed with daughter [phone]/26, 8/27, 8/28, 8/29, 8/30, 8/31, husband 9/1 Disposition Plan: Status is: Inpatient  Remains inpatient appropriate because:IV treatments appropriate due to intensity of illness or inability to take PO and Inpatient level of care appropriate due to severity of illness  Dispo: The patient is from:Home Anticipated d/c is ZO:XWRU Anticipated d/c date is: 3 days Patient currently is not medically stable to d/c due to increasing oxygen requirement.   Consultants:  Discussed with CT  Surgery 8/27  GI, now signed off  Procedures:  See below  Antimicrobials:    Anti-infectives (From admission, onward)   Start     Dose/Rate Route Frequency Ordered Stop   02/05/20 1000  remdesivir 100 mg in sodium chloride 0.9 % 100 mL IVPB        100 mg 200 mL/hr over 30 Minutes Intravenous Daily 02/04/20 0406 02/09/20 1201   02/04/20 0415  remdesivir 100 mg in sodium chloride 0.9 % 100 mL IVPB        100 mg 200 mL/hr over 30 Minutes Intravenous Every 30 min 02/04/20 0406 02/04/20 0522      Subjective: Patient tolerating diet, no CP and no SOB symptoms.    Objective: Vitals:   02/10/20 1009 02/10/20 1020 02/10/20 1100 02/10/20 1200  BP:      Pulse: (!) 115 (!) 117 97 94  Resp: (!) 28 (!) 24 (!) 24 (!) 22  Temp:      TempSrc:      SpO2: (!) 89% (!) 89% (!) 89% 92%  Weight:      Height:        Intake/Output Summary (Last 24 hours) at 02/10/2020 1307 Last data filed at 02/10/2020 0610 Gross per 24 hour  Intake --  Output 1200 ml  Net -1200 ml   Filed Weights   02/04/20 0138  Weight: 113.3 kg   Examination:  General exam: Appears calm and comfortable  Respiratory system: Clear to auscultation. Respiratory effort normal.  Currently on 3L nasal cannula oxygen. Cardiovascular system: normal S1 & S2 heard no M/R/G.  Gastrointestinal system: Abdomen is nondistended, soft and nontender.  Central nervous system: Alert and oriented. No focal neurological deficits. Extremities: Symmetric 5 x 5 power. Skin: No rashes, lesions or ulcers Psychiatry: normal affect today.  Data Reviewed: I have personally reviewed following labs and imaging studies  CBC: Recent Labs  Lab 02/05/20 0555 02/05/20 0555 02/06/20 0747 02/07/20 0839 02/08/20 0653 02/09/20 0637 02/10/20 0644  WBC 6.2   < > 10.3 9.6 11.0* 13.5* 15.8*  NEUTROABS 4.8  --  8.9* 8.1* 9.5* 11.7*  --   HGB 11.0*   < > 11.5* 11.5* 11.5* 11.6* 11.5*  HCT 34.9*   < > 37.7 37.0 36.3 36.3 37.0  MCV 83.9   < > 85.3 83.7 83.6 83.4 83.7  PLT 332   < > 410* 444* 450* 472* 521*   < > = values in this  interval not displayed.   Basic Metabolic Panel: Recent Labs  Lab 02/06/20 0747 02/07/20 0839 02/08/20 0653 02/09/20 0637 02/10/20 0644  NA 140 139 136 136 137  K 3.8 4.1 4.2 3.5 3.8  CL 104 101 100 99 98  CO2 GLUCOSE 96 101* 102* 93 98  BUN CREATININE 0.59 0.61 0.59 0.61 0.59  CALCIUM 8.4* 8.3* 8.4* 8.3* 8.5*   GFR: Estimated Creatinine Clearance: 112.4 mL/min (by C-G formula based on SCr of 0.59 mg/dL). Liver Function Tests: Recent Labs  Lab 02/06/20 0747 02/07/20 0839 02/08/20 0653 02/09/20 0637 02/10/20 0644  AST 53* 39 30 33 23  ALT 60* 51* 46* 42 35  ALKPHOS 106 94 91 87 84  BILITOT 0.8 1.0 0.6 0.9 0.6  PROT 6.8 6.7 6.5 6.5 6.4*  ALBUMIN 2.7* 2.7* 2.6* 2.5* 2.6*   Recent Labs  Lab 02/04/20 0327  LIPASE 126*   No results for input(s): AMMONIA in the last 168 hours.  Coagulation Profile: No results for input(s): INR, PROTIME in the last 168 hours. Cardiac Enzymes: No results for input(s): CKTOTAL, CKMB, CKMBINDEX, TROPONINI in the last 168 hours. BNP (last 3 results) No results for input(s): PROBNP in the last 8760 hours. HbA1C: No results for input(s): HGBA1C in the last 72 hours. CBG: No results for input(s): GLUCAP in the last 168 hours. Lipid Profile: No results for input(s): CHOL, HDL, LDLCALC, TRIG, CHOLHDL, LDLDIRECT in the last 72 hours. Thyroid Function Tests: No results for input(s): TSH, T4TOTAL, FREET4, T3FREE, THYROIDAB in the last 72 hours. Anemia Panel: No results for input(s): VITAMINB12, FOLATE, FERRITIN, TIBC, IRON, RETICCTPCT in the last 72 hours. Sepsis Labs: Recent Labs  Lab 02/04/20 0120 02/04/20 0327  PROCALCITON <0.10  --   LATICACIDVEN 1.6 1.0    Recent Results (from the past 240 hour(s))  Novel Coronavirus, NAA ([facility])     Status: Abnormal   Collection Time: 02/01/20  8:49 AM   Specimen: Nasopharyngeal Swab; Nasopharyngeal(NP) swabs in vial transport medium   Nasopharynge  Result  Value Ref Range Status   SARS-CoV-2, NAA Detected (A) Not Detected Final    Comment: Patients who have a positive COVID-19 test result may now have treatment options. Treatment options are available for patients with mild to moderate symptoms and for hospitalized patients. Visit our website at [email or web address] for resources and information. This nucleic acid amplification test was developed and its performance characteristics determined by [employer]. Nucleic acid amplification tests include RT-PCR and TMA. This test has not been FDA cleared or approved. This test has been authorized by FDA under an Emergency Use Authorization (EUA). This test is only authorized for the duration of time the declaration that circumstances exist justifying the authorization of the emergency use of in vitro diagnostic tests for detection of SARS-CoV-2 virus and/or diagnosis of COVID-19 infection under section 564(b)(1) of the Act, 21 U.S.C. 505LZJ-6(B) (1), unless the authorization is terminated or revoked sooner. When diagnostic testing is negativ e, the possibility of a false negative result should be considered in the context of a patient's recent exposures and the presence of clinical signs and symptoms consistent with COVID-19. An individual without symptoms of COVID-19 and who is not shedding SARS-CoV-2 virus would expect to have a negative (not detected) result in this assay.   SARS-COV-2, NAA 2 DAY TAT     Status: None   Collection Time: 02/01/20  8:49 AM   Nasopharynge  Result Value Ref Range Status   SARS-CoV-2, NAA 2 DAY TAT Performed  Final  Blood Culture (routine x 2)     Status: None   Collection Time: 02/04/20  1:20 AM   Specimen: Right Antecubital; Blood  Result Value Ref Range Status   Specimen Description RIGHT ANTECUBITAL  Final   Special Requests   Final    BOTTLES DRAWN AEROBIC AND ANAEROBIC Blood Culture adequate volume   Culture   Final    NO GROWTH 5  DAYS Performed at [facility], [address] [place], [place] [address]    Report Status 02/09/2020 FINAL  Final  Blood Culture (routine x 2)     Status: None   Collection Time: 02/04/20  3:27 AM   Specimen: BLOOD LEFT ARM  Result Value Ref Range Status   Specimen Description BLOOD LEFT ARM  Final   Special Requests   Final    BOTTLES DRAWN AEROBIC AND ANAEROBIC Blood Culture adequate volume   Culture   Final    NO GROWTH 5  DAYS Performed at [facility], [address] [place], [place] [address]    Report Status 02/09/2020 FINAL  Final    Radiology Studies: No results found.  Scheduled Meds: . vitamin C  500 mg Oral Daily  . baclofen  10 mg Oral TID  . baricitinib  4 mg Oral Daily  . bisacodyl  10 mg Rectal Once  . bisacodyl  10 mg Rectal Once  . buPROPion  150 mg Oral BID  . cholecalciferol  1,000 Units Oral Daily  . dextromethorphan-guaiFENesin  1 tablet Oral BID  . enoxaparin (LOVENOX) injection  40 mg Subcutaneous Q12H  . fluticasone  1 spray Each Nare Daily  . gabapentin  300 mg Oral TID  . loratadine  10 mg Oral Daily  . mouth rinse  15 mL Mouth Rinse BID  . methylPREDNISolone (SOLU-MEDROL) injection  55 mg Intravenous Q12H  . ondansetron (ZOFRAN) IV  4 mg Intravenous Q6H  . pantoprazole  40 mg Oral BID AC  . sertraline  25 mg Oral BID  . zinc sulfate  220 mg Oral Daily   Continuous Infusions: . lactated ringers 75 mL/hr at 02/10/20 0444    LOS: 6 days   Time spent: 30 minutes  Seaton Hofmann Laural Benes, MD How to contact the [facility] Attending or Consulting provider 7A - 7P or covering provider during after hours 7P -7A, for this patient?  1. Check the care team in [facility] and look for a) attending/consulting TR[facility] provider listed and b) the [facility] team listed 2. Log into [email or web address] and use [place]'s universal password to access. If you do not have the password, please contact the hospital operator. 3. Locate the [facility] provider you are looking for under [facility]  and page to a number that you can be directly reached. 4. If you still have difficulty reaching the provider, please page the [facility] (Director on Call) for the Hospitalists listed on amion for assistance.  If 7PM-7AM, please contact night-coverage [email or web address] 02/10/2020, 1:07 PM

## 2020-02-10 NOTE — Progress Notes (Signed)
Pt stated that her bed was wet, noted that purewick was dislodged and linens wet. Pt was cleaned and linens changed, pt turned side to side to complete this. Pt's SaO2 dropped to 69% during movement, HR in low 100's. Once finished, O2 increased to 4 lpm HFNC and placed pt on left lateral side lying position, as SaO2 was only up to 77% on 3lpm HFNC. Pt's SaO2 up to 82% then back down in mid-70's. Resp up to 28/min. O2 increased to 6 lpm HFNC, resp still 28/min, SaO2 steady in high 70's. Slowly SaO2 up to 82% on 6lpm HFNC. Currently, SaO2 84%, resp 24-28/min, no accessory muscle use noted. HR 97/min per monitor. Dr. Laural Benes notified of pt's current status and O2 needs.

## 2020-02-10 NOTE — Progress Notes (Signed)
PT Cancellation Note  Patient Details Name: Melinda Moore MRN: [id number] DOB: 07/26/1970   Cancelled Treatment:    Reason Eval/Treat Not Completed: Medical issues which prohibited therapy.  Patient transferred to a higher level of care and will need new PT consult resume therapy when patient is medically stable.  Thank you.   2:46 PM, 02/10/20 Ocie Bob, MPT Physical Therapist with [facility] [phone] office [phone] mobile phone

## 2020-02-10 NOTE — Plan of Care (Signed)

## 2020-02-10 NOTE — Progress Notes (Signed)
In to do ambulation test for Home O2 needs. Had pt sit up on side of bed, HR up to 144 bpm, SaO2 on 3lpm HFNC dropped to 77%, resp increased to 36/min and pt became lightheaded. Increased O2 to 6lpm HFNC and had her assume right lateral side lying position, HR 118/min, SaO2 back up to 91%, resp 28/min. Now decreased O2 to 4lpm HFNC, still right lateral side lying, SaO2 89%, HR 118, resp 28/min. MD Laural Benes notified.

## 2020-02-11 ENCOUNTER — Inpatient Hospital Stay (HOSPITAL_COMMUNITY): Payer: 59

## 2020-02-11 DIAGNOSIS — J9600 Acute respiratory failure, unspecified whether with hypoxia or hypercapnia: Secondary | ICD-10-CM

## 2020-02-11 DIAGNOSIS — R748 Abnormal levels of other serum enzymes: Secondary | ICD-10-CM

## 2020-02-11 DIAGNOSIS — G894 Chronic pain syndrome: Secondary | ICD-10-CM

## 2020-02-11 LAB — CBC
HCT: 37.3 % (ref 36.0–46.0)
Hemoglobin: 11.4 g/dL — ABNORMAL LOW (ref 12.0–15.0)
MCH: 25.9 pg — ABNORMAL LOW (ref 26.0–34.0)
MCHC: 30.6 g/dL (ref 30.0–36.0)
MCV: 84.6 fL (ref 80.0–100.0)
Platelets: 518 10*3/uL — ABNORMAL HIGH (ref 150–400)
RBC: 4.41 MIL/uL (ref 3.87–5.11)
RDW: 14.4 % (ref 11.5–15.5)
WBC: 10.8 10*3/uL — ABNORMAL HIGH (ref 4.0–10.5)
nRBC: 0 % (ref 0.0–0.2)

## 2020-02-11 LAB — COMPREHENSIVE METABOLIC PANEL
ALT: 31 U/L (ref 0–44)
AST: 19 U/L (ref 15–41)
Albumin: 2.5 g/dL — ABNORMAL LOW (ref 3.5–5.0)
Alkaline Phosphatase: 81 U/L (ref 38–126)
Anion gap: 11 (ref 5–15)
BUN: 15 mg/dL (ref 6–20)
CO2: 27 mmol/L (ref 22–32)
Calcium: 8.4 mg/dL — ABNORMAL LOW (ref 8.9–10.3)
Chloride: 101 mmol/L (ref 98–111)
Creatinine, Ser: 0.63 mg/dL (ref 0.44–1.00)
GFR calc Af Amer: >60 mL/min (ref >60)
GFR calc non Af Amer: >60 mL/min (ref >60)
Glucose, Bld: 89 mg/dL (ref 70–99)
Potassium: 3.5 mmol/L (ref 3.5–5.1)
Sodium: 139 mmol/L (ref 135–145)
Total Bilirubin: 0.6 mg/dL (ref 0.3–1.2)
Total Protein: 6.3 g/dL — ABNORMAL LOW (ref 6.5–8.1)

## 2020-02-11 LAB — D-DIMER, QUANTITATIVE: D-Dimer, Quant: 0.86 ug/mL-FEU — ABNORMAL HIGH (ref 0.00–0.50)

## 2020-02-11 LAB — C-REACTIVE PROTEIN: CRP: 6.2 mg/dL — ABNORMAL HIGH (ref <1.0)

## 2020-02-11 NOTE — Progress Notes (Signed)
PROGRESS NOTE    AURIEL KIST  GLO:[id number] DOB: 1970-07-01 DOA: 02/04/2020 PCP: Babs Sciara, MD   Brief Narrative:  Per HPI: Melinda Moore a 49 y.o.femalewith medical history significant ofMS, hypertension, anxiety, depression, Bell's palsy, DVT in 2013, fibromyalgia, trigeminal neuralgia presenting with complaints of shortness of breath after recently testing positive for Covid on 8/23 during an urgent care visit.Patient reports 5-day history of shortness of breath, fatigue,nausea and poor oral intake. Denies fevers, cough, or chest pain. States she has had some slight vomiting at home but is mostly feeling nauseous and has lost her appetite. She experienced some epigastric abdominal discomfort when she vomited but denies abdominal pain otherwise. Denies diarrhea. She has not been vaccinated against Covid.  8/26:Patient has been admitted for acute hypoxemic respiratory failure in the setting of COVID-19 pneumonia. Oxygen supplementation requirements have increased since admission up to 8 L this morning. She does not appear to be quite short of breath however. Patient has been started on Solu-Medrol as well as remdesivir. Plan to add bariticinibtoday in order to assist further with treatment given high O2 requirements as well as increase in inflammatory markers. Procalcitonin low, therefore avoid antibiotics. Patient does have some nausea and vomiting likely related to Covid. Patient noted to have elevated alkaline phosphatase as well as GGT and minimal increase in lipase. Plan for right upper quadrant ultrasound today and maintain on clear liquids as well as IV fluid for now. Spoke with daughterAnaya [phone].  8/27:Patient continues to have worsening hypoxemia as well as elevated D-dimer. Lovenox changed to twice daily. CT chest angiogram with no sign of PE, but unfortunately there does appear to be a large pneumomediastinum. Discussed case with CT surgery with barium  esophagram performed with no contrast extravasation noted. Patient down to 5 L nasal cannula at this time and otherwise appears comfortable. She is still having some trouble with nausea and vomiting intermittently.  8/28:Patient noted to have persistent hypoxemia and oxygen demand seems to have worsened to 10 L this a.m. She is also had quite a bit of chest congestion as well as indigestion, nausea, and vomiting despite scheduling of Zofran and Phenergan as needed. Continue clear liquid diet for now. Question the presence of a microperforation that would have contributed to large pneumomediastinum. Fortunately, imaging with barium esophagram on 8/27 with no acute findings noted. Appreciate GI consultation for assistance in management of persistent GI symptoms as well as concern for microperforation.  8/29:Patient noted to have improvement in hypoxemia noted this morning. She is down to 6 L nasal cannula and has not had a bowel movement in the last several days. She denies any further nausea or vomiting and has been coughing up mucus. Inflammatory markers downtrending.  8/30: Patient noted to still require 6 L nasal cannula oxygen this morning.  She is asking for suppository with no bowel movement noted yesterday.  Plan to advance diet after discussion with GI today.  No significant nausea or vomiting noted this morning.  8/31: Patient is eager for discharge, but continues to remain on 6 L nasal cannula oxygen.  She is able to tolerate her soft diet and has been transitioned to oral PPI twice daily by GI.  Continue on soft diet and plan to ambulate further today and wean oxygen as tolerated.  Continue to monitor CRP levels.  9/1:  Pt was going to be discharged, however she became severely dyspneic and tachycardic when home O2 eval was being performed. DC placed on hold.  Pt now on 4L [place].   ** Update: Pt experienced further respiratory distress now on 10L HFNC.  She is transferred to stepdown  ICU.  I called and spoke with husband Mr. Dockham for update on change in status.  He wanted to know what treatments she was on and I told him.  He verbalized understanding.  Repeat CXR was ordered and viewed by me.  2nd bedside visit to reassess.  I also conferenced with RT.  No IS, flutter valve or chest PT for now due to pneumomediastinum.    9/2: Pt remains in stepdown ICU, now down to 7L HFNC, ordered repeat CT chest and doppler US BLEs to rule out DVT.   Assessment & Plan:   Principal Problem:   Pneumonia due to COVID-19 virus Active Problems:   Elevated alkaline phosphatase level   Chronic pain syndrome   Acute hypoxemic respiratory failure (HCC)   Elevated transaminase level   Acute respiratory failure due to COVID-19 [facility])   Acute hypoxemic respiratory failure due to COVID-19 [facility])   Acute respiratory failure with hypoxia (HCC)   Acute hypoxemic respiratory failure secondary to COVID-19 viral pneumonia: Tested positive for Covid on 02/01/2020. Febrile, tachycardic, and tachypneic. Oxygen saturation 86% on room air. Currently satting 89-90% on 7 L supplemental oxygen. Chest x-ray showing developing infiltrate in the left mid to lower lung field and right lung base. Bacterial pneumonia less likely given no leukocytosis or procalcitonin elevation. Inflammatory markers elevated: D-dimer 1.62, LDH 361, ferritin 233, fibrinogen 762, and CRP 17.4. Lactic acid normal x2. -Remdesivir dosing per pharmacy -IV Solu-Medrol 0.5 mg/kg every 12 hours -Vitamin C, zinc, vitamin D -Antitussives as needed -continue baricitinib as ordered.  -Tylenol as needed -Daily CBC with differential, CMP, CRP, D-dimer -Encourage prone positioning -Incentive spirometry, flutter valve -Airborne and contact precautions -Continuous pulse ox -Supplemental oxygen as needed to keep oxygen saturation above 90% -Blood culture x2negative to date -Continue Mucinex for chest congestion -CRP levels upward trending,  continue monitoring in a.m. -Plan to wean oxygen levels and ambulate further as able.  I have asked for a PT evaluation.  Continue supplemental oxygen.     COVID-19 viral gastroenteritis:Patient initially reported having nausea and a few episodes of vomiting at home. Endorsing poor oral intake. Transaminases elevated in the setting of acute viral illness. T bili normal. Abdominal exam benign. -Appreciate GI evaluation with transition to oral PPI twice daily -Continue on soft diet even on discharge -GI plans to follow-up in outpatient setting  Large pneumomediastinum -Discussed case with CT surgery, esophagram and CT chest performed with no sign of extravasation noted -Continue soft diet -Okay to ambulate at this point -Avoid incentive spirometry and flutter valve -Plan for now is to follow-up with CT surgery in outpatient setting for monitoring, no need for surgical intervention -with increasing oxygen requirements, ordered a repeat CT chest to further evaluate.   Elevated transaminases:Likely due to COVID-19 viral infection. -Continue to monitor LFTs  Mildly elevated alkaline phosphatase level -GGT elevated, but no significant findings on right upper quadrant ultrasound aside from fatty liver disease  Chronic pain syndrome -Resume home Dilaudid as neededq6 hours for severe pain  Anxiety, depression -Continue home medications  MS -Continue home medications  DVT prophylaxis:Lovenox BID Code Status:Full Family Communication:Discussed with daughter [phone]/26, 8/27, 8/28, 8/29, 8/30, 8/31, husband 9/1 Disposition Plan: Status is: Inpatient  Remains inpatient appropriate because:IV treatments appropriate due to intensity of illness or inability to take PO and Inpatient level of care appropriate due to  severity of illness.  Unfortunately pt has developed increasing oxygen requirements, awaiting repeat CT chest, US doppler BLEs.    Dispo: The patient is  from:Home Anticipated d/c is YK:ZLDJ Anticipated d/c date is: >3 days Patient currently is not medically stable to d/c due to increasing oxygen requirement.   Consultants:  Discussed with CT Surgery 8/27  GI, now signed off  Procedures:  See below  Antimicrobials:  Anti-infectives (From admission, onward)   Start     Dose/Rate Route Frequency Ordered Stop   02/05/20 1000  remdesivir 100 mg in sodium chloride 0.9 % 100 mL IVPB        100 mg 200 mL/hr over 30 Minutes Intravenous Daily 02/04/20 0406 02/09/20 1201   02/04/20 0415  remdesivir 100 mg in sodium chloride 0.9 % 100 mL IVPB        100 mg 200 mL/hr over 30 Minutes Intravenous Every 30 min 02/04/20 0406 02/04/20 0522      Subjective: Patient c/o leg pain, c/o SOB, denies chest pain.     Objective: Vitals:   02/11/20 0700 02/11/20 0800 02/11/20 0900 02/11/20 1000  BP: 118/63 123/67 128/69 121/68  Pulse: 96 98 97 100  Resp: (!) 29 (!) 27 (!) 28 (!) 33  Temp:  98.3 F (36.8 C)    TempSrc:  Oral    SpO2: (!) 87% 92% 93% 92%  Weight:      Height:        Intake/Output Summary (Last 24 hours) at 02/11/2020 1108 Last data filed at 02/11/2020 1000 Gross per 24 hour  Intake 4396.18 ml  Output 1650 ml  Net 2746.18 ml   Filed Weights   02/04/20 0138 02/11/20 0441  Weight: 113.3 kg 105.3 kg   Examination:  General exam: Appears calm and comfortable  Respiratory system:  On HFNC 7L, moderate increased WOB. Cardiovascular system: normal S1 & S2 heard no M/R/G.  Gastrointestinal system: Abdomen is nondistended, soft and nontender.  Central nervous system: Alert and oriented. No focal neurological deficits. Extremities: leg edema mild L>R. Skin: No rashes, lesions or ulcers Psychiatry: normal affect today.  Data Reviewed: I have personally reviewed following labs and imaging studies  CBC: Recent Labs  Lab 02/05/20 0555 02/05/20 0555 02/06/20 0747 02/06/20 0747  02/07/20 0839 02/08/20 0653 02/09/20 0637 02/10/20 0644 02/11/20 0544  WBC 6.2   < > 10.3   < > 9.6 11.0* 13.5* 15.8* 10.8*  NEUTROABS 4.8  --  8.9*  --  8.1* 9.5* 11.7*  --   --   HGB 11.0*   < > 11.5*   < > 11.5* 11.5* 11.6* 11.5* 11.4*  HCT 34.9*   < > 37.7   < > 37.0 36.3 36.3 37.0 37.3  MCV 83.9   < > 85.3   < > 83.7 83.6 83.4 83.7 84.6  PLT 332   < > 410*   < > 444* 450* 472* 521* 518*   < > = values in this interval not displayed.   Basic Metabolic Panel: Recent Labs  Lab 02/07/20 0839 02/08/20 0653 02/09/20 0637 02/10/20 0644 02/11/20 0544  NA 139 136 136 137 139  K 4.1 4.2 3.5 3.8 3.5  CL 101 100 99 98 101  CO2 24 27 24 25 27   GLUCOSE 101* 102* 93 98 89  BUN 14 16 15 17 15   CREATININE 0.61 0.59 0.61 0.59 0.63  CALCIUM 8.3* 8.4* 8.3* 8.5* 8.4*   GFR: Estimated Creatinine Clearance: 108.1 mL/min (by C-G formula  based on SCr of 0.63 mg/dL). Liver Function Tests: Recent Labs  Lab 02/07/20 0839 02/08/20 0653 02/09/20 0637 02/10/20 0644 02/11/20 0544  AST 39 30 33 23 19  ALT 51* 46* 42 35 31  ALKPHOS 94 91 87 84 81  BILITOT 1.0 0.6 0.9 0.6 0.6  PROT 6.7 6.5 6.5 6.4* 6.3*  ALBUMIN 2.7* 2.6* 2.5* 2.6* 2.5*   No results for input(s): LIPASE, AMYLASE in the last 168 hours. No results for input(s): AMMONIA in the last 168 hours. Coagulation Profile: No results for input(s): INR, PROTIME in the last 168 hours. Cardiac Enzymes: No results for input(s): CKTOTAL, CKMB, CKMBINDEX, TROPONINI in the last 168 hours. BNP (last 3 results) No results for input(s): PROBNP in the last 8760 hours. HbA1C: No results for input(s): HGBA1C in the last 72 hours. CBG: No results for input(s): GLUCAP in the last 168 hours. Lipid Profile: No results for input(s): CHOL, HDL, LDLCALC, TRIG, CHOLHDL, LDLDIRECT in the last 72 hours. Thyroid Function Tests: No results for input(s): TSH, T4TOTAL, FREET4, T3FREE, THYROIDAB in the last 72 hours. Anemia Panel: No results for input(s):  VITAMINB12, FOLATE, FERRITIN, TIBC, IRON, RETICCTPCT in the last 72 hours. Sepsis Labs: No results for input(s): PROCALCITON, LATICACIDVEN in the last 168 hours.  Recent Results (from the past 240 hour(s))  Blood Culture (routine x 2)     Status: None   Collection Time: 02/04/20  1:20 AM   Specimen: Right Antecubital; Blood  Result Value Ref Range Status   Specimen Description RIGHT ANTECUBITAL  Final   Special Requests   Final    BOTTLES DRAWN AEROBIC AND ANAEROBIC Blood Culture adequate volume   Culture   Final    NO GROWTH 5 DAYS Performed at [facility], [address] [place], [place] [address]    Report Status 02/09/2020 FINAL  Final  Blood Culture (routine x 2)     Status: None   Collection Time: 02/04/20  3:27 AM   Specimen: BLOOD LEFT ARM  Result Value Ref Range Status   Specimen Description BLOOD LEFT ARM  Final   Special Requests   Final    BOTTLES DRAWN AEROBIC AND ANAEROBIC Blood Culture adequate volume   Culture   Final    NO GROWTH 5 DAYS Performed at [facility], [address], [place], [place] [address]    Report Status 02/09/2020 FINAL  Final  MRSA PCR Screening     Status: None   Collection Time: 02/10/20  2:40 PM   Specimen: Nasopharyngeal  Result Value Ref Range Status   MRSA by PCR NEGATIVE NEGATIVE Final    Comment:        The GeneXpert MRSA Assay (FDA approved for NASAL specimens only), is one component of a comprehensive MRSA colonization surveillance program. It is not intended to diagnose MRSA infection nor to guide or monitor treatment for MRSA infections. Performed at [facility], [address] [place], [place] [address]     Radiology Studies: DG CHEST PORT 1 VIEW  Result Date: 02/10/2020 CLINICAL DATA:  Shortness of breath. EXAM: PORTABLE CHEST 1 VIEW COMPARISON:  February 04, 2020. FINDINGS: Stable cardiomediastinal silhouette. No pneumothorax or pleural effusion is noted. Left subclavian Port-A-Cath is unchanged in position. Mild  bibasilar subsegmental atelectasis or possibly infiltrates are noted. Bony thorax is unremarkable. IMPRESSION: Mild bibasilar subsegmental atelectasis or infiltrates are noted. Electronically Signed   By: Lupita Raider M.D.   On: 02/10/2020 14:18    Scheduled Meds: . vitamin C  500 mg Oral Daily  . baclofen  10 mg Oral TID  . baricitinib  4 mg Oral Daily  . buPROPion  150 mg Oral BID  . Chlorhexidine Gluconate Cloth  6 each Topical Daily  . cholecalciferol  1,000 Units Oral Daily  . dextromethorphan-guaiFENesin  1 tablet Oral BID  . enoxaparin (LOVENOX) injection  40 mg Subcutaneous Q12H  . fluticasone  1 spray Each Nare Daily  . gabapentin  300 mg Oral TID  . Ipratropium-Albuterol  2 puff Inhalation Q6H WA  . loratadine  10 mg Oral Daily  . mouth rinse  15 mL Mouth Rinse BID  . methylPREDNISolone (SOLU-MEDROL) injection  55 mg Intravenous Q12H  . ondansetron (ZOFRAN) IV  4 mg Intravenous Q6H  . pantoprazole  40 mg Oral BID AC  . sertraline  25 mg Oral BID  . zinc sulfate  220 mg Oral Daily   Continuous Infusions: . lactated ringers 75 mL/hr at 02/11/20 0908    LOS: 7 days   Critical Care Procedure Note Authorized and Performed by: Maryln Manuel MD  Total Critical Care time:  32 mins Due to a high probability of clinically significant, life threatening deterioration, the patient required my highest level of preparedness to intervene emergently and I personally spent this critical care time directly and personally managing the patient.  This critical care time included obtaining a history; examining the patient, pulse oximetry; ordering and review of studies; arranging urgent treatment with development of a management plan; evaluation of patient's response of treatment; frequent reassessment; and discussions with other providers.  This critical care time was performed to assess and manage the high probability of imminent and life threatening deterioration that could result in multi-organ  failure.  It was exclusive of separately billable procedures and treating other patients and teaching time.   Standley Dakins, MD How to contact the [facility] Attending or Consulting provider 7A - 7P or covering provider during after hours 7P -7A, for this patient?  1. Check the care team in [facility] and look for a) attending/consulting TRH provider listed and b) the [facility] team listed 2. Log into [email or web address] and use [place]'s universal password to access. If you do not have the password, please contact the hospital operator. 3. Locate the [facility] provider you are looking for under [facility] and page to a number that you can be directly reached. 4. If you still have difficulty reaching the provider, please page the [facility] (Director on Call) for the Hospitalists listed on amion for assistance.  If 7PM-7AM, please contact night-coverage [email or web address] 02/11/2020, 11:08 AM

## 2020-02-12 LAB — D-DIMER, QUANTITATIVE: D-Dimer, Quant: 1.13 ug/mL-FEU — ABNORMAL HIGH (ref 0.00–0.50)

## 2020-02-12 LAB — CBC
HCT: 36.4 % (ref 36.0–46.0)
Hemoglobin: 11.3 g/dL — ABNORMAL LOW (ref 12.0–15.0)
MCH: 26.1 pg (ref 26.0–34.0)
MCHC: 31 g/dL (ref 30.0–36.0)
MCV: 84.1 fL (ref 80.0–100.0)
Platelets: 585 10*3/uL — ABNORMAL HIGH (ref 150–400)
RBC: 4.33 MIL/uL (ref 3.87–5.11)
RDW: 14.2 % (ref 11.5–15.5)
WBC: 13.2 10*3/uL — ABNORMAL HIGH (ref 4.0–10.5)
nRBC: 0 % (ref 0.0–0.2)

## 2020-02-12 LAB — COMPREHENSIVE METABOLIC PANEL
ALT: 33 U/L (ref 0–44)
AST: 18 U/L (ref 15–41)
Albumin: 2.5 g/dL — ABNORMAL LOW (ref 3.5–5.0)
Alkaline Phosphatase: 83 U/L (ref 38–126)
Anion gap: 12 (ref 5–15)
BUN: 12 mg/dL (ref 6–20)
CO2: 26 mmol/L (ref 22–32)
Calcium: 8.5 mg/dL — ABNORMAL LOW (ref 8.9–10.3)
Chloride: 100 mmol/L (ref 98–111)
Creatinine, Ser: 0.57 mg/dL (ref 0.44–1.00)
GFR calc Af Amer: >60 mL/min (ref >60)
GFR calc non Af Amer: >60 mL/min (ref >60)
Glucose, Bld: 99 mg/dL (ref 70–99)
Potassium: 3.9 mmol/L (ref 3.5–5.1)
Sodium: 138 mmol/L (ref 135–145)
Total Bilirubin: 0.6 mg/dL (ref 0.3–1.2)
Total Protein: 6.4 g/dL — ABNORMAL LOW (ref 6.5–8.1)

## 2020-02-12 LAB — C-REACTIVE PROTEIN: CRP: 3.7 mg/dL — ABNORMAL HIGH (ref <1.0)

## 2020-02-12 NOTE — TOC Progression Note (Signed)
Transition of Care [facility]) - Progression Note    Patient Details  Name: Melinda Moore MRN: [id number] Date of Birth: [birth year]  Transition of Care [facility]) CM/SW Contact  Annice Needy, LCSW Phone Number: 02/12/2020, 4:58 PM  Clinical Narrative:    PT recommendation for SNF discussed with patient. Patient declined SNF. Says she has strong family support. Agreeable to [facility] when discharged.        Expected Discharge Plan and Services           Expected Discharge Date: 02/10/20                                     Social Determinants of Health (SDOH) Interventions    Readmission Risk Interventions No flowsheet data found.

## 2020-02-12 NOTE — Evaluation (Signed)
Physical Therapy Evaluation Patient Details Name: Melinda Moore MRN: [id number] DOB: 1971-05-23 Today's Date: 02/12/2020   History of Present Illness  Melinda Moore is a 49 y.o. female with medical history significant of MS, hypertension, anxiety, depression, Bell's palsy, DVT in 2013, fibromyalgia, trigeminal neuralgia presenting with complaints of shortness of breath after recently testing positive for Covid on 8/23 during an urgent care visit.  Patient reports 5-day history of shortness of breath, fatigue, nausea and poor oral intake.  Denies fevers, cough, or chest pain.  States she has had some slight vomiting at home but is mostly feeling nauseous and has lost her appetite.  She experienced some epigastric abdominal discomfort when she vomited but denies abdominal pain otherwise.  Denies diarrhea.  She has not been vaccinated against Covid.    Clinical Impression  Patient very unsteady on feet and limited to a few steps at bedside due to weakness, poor standing balance, and SOB with SpO2 dropping from 95% to 70%, able to recover to low 80's% once seated in chair and tolerated staying up in chair after therapy - RN notified.  Patient will benefit from continued physical therapy in hospital and recommended venue below to increase strength, balance, endurance for safe ADLs and gait.    Follow Up Recommendations SNF;Supervision for mobility/OOB;Supervision - Intermittent    Equipment Recommendations  None recommended by PT    Recommendations for Other Services       Precautions / Restrictions Precautions Precautions: Fall Restrictions Weight Bearing Restrictions: No      Mobility  Bed Mobility Overal bed mobility: Modified Independent             General bed mobility comments: head of bed raised  Transfers Overall transfer level: Needs assistance Equipment used: 1 person hand held assist Transfers: Sit to/from Stand;Stand Pivot Transfers Sit to Stand: Min assist Stand pivot  transfers: Min assist       General transfer comment: slow labored movement having to lean on nearby objects for support  Ambulation/Gait Ambulation/Gait assistance: Min assist;Mod assist Gait Distance (Feet): 4 Feet Assistive device: 1 person hand held assist Gait Pattern/deviations: Decreased step length - right;Decreased step length - left;Decreased stride length Gait velocity: slow   General Gait Details: limited to 4-5 slow unsteady labored steps due to generalized weakness and SOB with SpO2 dropping from 95% to 70%  Stairs            Wheelchair Mobility    Modified Rankin (Stroke Patients Only)       Balance Overall balance assessment: Needs assistance Sitting-balance support: Feet supported;No upper extremity supported Sitting balance-Leahy Scale: Good Sitting balance - Comments: seated at EOB   Standing balance support: During functional activity;No upper extremity supported Standing balance-Leahy Scale: Poor Standing balance comment: hand held assist, had to lean on nearby objects for support                             Pertinent Vitals/Pain Pain Assessment: No/denies pain    Home Living Family/patient expects to be discharged to:: Private residence Living Arrangements: Spouse/significant other   Type of Home: House Home Access: Stairs to enter Entrance Stairs-Rails: None Entrance Stairs-Number of Steps: 3-4 Home Layout: Two level;Able to live on main level with bedroom/bathroom Home Equipment: Dan Humphreys - 2 wheels;Cane - single point      Prior Function Level of Independence: Independent         Comments: Tourist information centre manager, drives  Hand Dominance        Extremity/Trunk Assessment   Upper Extremity Assessment Upper Extremity Assessment: Generalized weakness    Lower Extremity Assessment Lower Extremity Assessment: Generalized weakness    Cervical / Trunk Assessment Cervical / Trunk Assessment: Normal  Communication    Communication: No difficulties  Cognition Arousal/Alertness: Awake/alert Behavior During Therapy: WFL for tasks assessed/performed Overall Cognitive Status: Within Functional Limits for tasks assessed                                        General Comments      Exercises     Assessment/Plan    PT Assessment Patient needs continued PT services  PT Problem List Decreased strength;Decreased activity tolerance;Decreased balance;Decreased mobility       PT Treatment Interventions Balance training;Gait training;Stair training;Functional mobility training;Therapeutic activities;Therapeutic exercise;Patient/family education;DME instruction    PT Goals (Current goals can be found in the Care Plan section)  Acute Rehab PT Goals Patient Stated Goal: return home with family to assist PT Goal Formulation: With patient Time For Goal Achievement: 02/26/20 Potential to Achieve Goals: Good    Frequency Min 3X/week   Barriers to discharge        Co-evaluation               AM-PAC PT "6 Clicks" Mobility  Outcome Measure Help needed turning from your back to your side while in a flat bed without using bedrails?: None Help needed moving from lying on your back to sitting on the side of a flat bed without using bedrails?: A Little Help needed moving to and from a bed to a chair (including a wheelchair)?: A Lot Help needed standing up from a chair using your arms (e.g., wheelchair or bedside chair)?: A Lot Help needed to walk in hospital room?: A Lot Help needed climbing 3-5 steps with a railing? : A Lot 6 Click Score: 15    End of Session Equipment Utilized During Treatment: Oxygen Activity Tolerance: Patient tolerated treatment well;Patient limited by fatigue Patient left: in chair;with call bell/phone within reach Nurse Communication: Mobility status PT Visit Diagnosis: Unsteadiness on feet (R26.81);Other abnormalities of gait and mobility (R26.89);Muscle  weakness (generalized) (M62.81)    Time: 4259-5638 PT Time Calculation (min) (ACUTE ONLY): 21 min   Charges:   PT Evaluation $PT Eval Moderate Complexity: 1 Mod PT Treatments $Therapeutic Activity: 8-22 mins        3:43 PM, 02/12/20 Melinda Moore, MPT Physical Therapist with [facility] [phone] office [phone] mobile phone

## 2020-02-12 NOTE — Progress Notes (Signed)
SATURATION QUALIFICATIONS: (This note is used to comply with regulatory documentation for home oxygen)  Patient Saturations on 6 Liters of oxygen at Rest = 87-96%  Patient Saturations on 6 Liters of oxygen while Ambulating = 67-72%

## 2020-02-12 NOTE — Progress Notes (Signed)
PROGRESS NOTE    Melinda Moore  TMH:[id number] DOB: [birth year] DOA: 02/04/2020 PCP: Babs Sciara, MD  Brief Narrative:  Per HPI: Melinda Moore a 49 y.o.femalewith medical history significant ofMS, hypertension, anxiety, depression, Bell's palsy, DVT in 2013, fibromyalgia, trigeminal neuralgia presenting with complaints of shortness of breath after recently testing positive for Covid on 8/23 during an urgent care visit.Patient reports 5-day history of shortness of breath, fatigue,nausea and poor oral intake. Denies fevers, cough, or chest pain. States she has had some slight vomiting at home but is mostly feeling nauseous and has lost her appetite. She experienced some epigastric abdominal discomfort when she vomited but denies abdominal pain otherwise. Denies diarrhea. She has not been vaccinated against Covid.  8/26:Patient has been admitted for acute hypoxemic respiratory failure in the setting of COVID-19 pneumonia. Oxygen supplementation requirements have increased since admission up to 8 L this morning. She does not appear to be quite short of breath however. Patient has been started on Solu-Medrol as well as remdesivir. Plan to add bariticinibtoday in order to assist further with treatment given high O2 requirements as well as increase in inflammatory markers. Procalcitonin low, therefore avoid antibiotics. Patient does have some nausea and vomiting likely related to Covid. Patient noted to have elevated alkaline phosphatase as well as GGT and minimal increase in lipase. Plan for right upper quadrant ultrasound today and maintain on clear liquids as well as IV fluid for now. Spoke with daughterAnaya [phone].  8/27:Patient continues to have worsening hypoxemia as well as elevated D-dimer. Lovenox changed to twice daily. CT chest angiogram with no sign of PE, but unfortunately there does appear to be a large pneumomediastinum. Discussed case with CT surgery with barium  esophagram performed with no contrast extravasation noted. Patient down to 5 L nasal cannula at this time and otherwise appears comfortable. She is still having some trouble with nausea and vomiting intermittently.  8/28:Patient noted to have persistent hypoxemia and oxygen demand seems to have worsened to 10 L this a.m. She is also had quite a bit of chest congestion as well as indigestion, nausea, and vomiting despite scheduling of Zofran and Phenergan as needed. Continue clear liquid diet for now. Question the presence of a microperforation that would have contributed to large pneumomediastinum. Fortunately, imaging with barium esophagram on 8/27 with no acute findings noted. Appreciate GI consultation for assistance in management of persistent GI symptoms as well as concern for microperforation.  8/29:Patient noted to have improvement in hypoxemia noted this morning. She is down to 6 L nasal cannula and has not had a bowel movement in the last several days. She denies any further nausea or vomiting and has been coughing up mucus. Inflammatory markers downtrending.  8/30: Patient noted to still require 6 L nasal cannula oxygen this morning.  She is asking for suppository with no bowel movement noted yesterday.  Plan to advance diet after discussion with GI today.  No significant nausea or vomiting noted this morning.  8/31: Patient is eager for discharge, but continues to remain on 6 L nasal cannula oxygen.  She is able to tolerate her soft diet and has been transitioned to oral PPI twice daily by GI.  Continue on soft diet and plan to ambulate further today and wean oxygen as tolerated.  Continue to monitor CRP levels.  9/1:  Pt was going to be discharged, however she became severely dyspneic and tachycardic when home O2 eval was being performed. DC placed on hold.  Pt  now on 4L [place].   ** Update: Pt experienced further respiratory distress now on 10L HFNC.  She is transferred to stepdown  ICU.  I called and spoke with husband Mr. Melinda Moore for update on change in status.  He wanted to know what treatments she was on and I told him.  He verbalized understanding.  Repeat CXR was ordered and viewed by me.  2nd bedside visit to reassess.  I also conferenced with RT.  No IS, flutter valve or chest PT for now due to pneumomediastinum.    9/2: Pt remains in stepdown ICU, now down to 7L HFNC, ordered repeat CT chest and doppler US BLEs to rule out DVT.   Assessment & Plan:   Principal Problem:   Pneumonia due to COVID-19 virus Active Problems:   Elevated alkaline phosphatase level   Chronic pain syndrome   Acute hypoxemic respiratory failure (HCC)   Elevated transaminase level   Acute respiratory failure due to COVID-19 [facility])   Acute hypoxemic respiratory failure due to COVID-19 [facility])   Acute respiratory failure with hypoxia (HCC)  Acute hypoxemic respiratory failure secondary to COVID-19 viral pneumonia: Tested positive for Covid on 02/01/2020. Febrile, tachycardic, and tachypneic. Oxygen saturation 86% on room air. Currently satting 89-90% on 7 L supplemental oxygen. Chest x-ray showing developing infiltrate in the left mid to lower lung field and right lung base. Bacterial pneumonia less likely given no leukocytosis or procalcitonin elevation. Inflammatory markers elevated: D-dimer 1.62, LDH 361, ferritin 233, fibrinogen 762, and CRP 17.4. Lactic acid normal x2. -Remdesivir dosing per pharmacy -IV Solu-Medrol 0.5 mg/kg every 12 hours -Vitamin C, zinc, vitamin D -Antitussives as needed -continue baricitinib as ordered.  -Tylenol as needed -Daily CBC with differential, CMP, CRP, D-dimer -Encourage prone positioning -Incentive spirometry, flutter valve -Airborne and contact precautions -Continuous pulse ox -Supplemental oxygen as needed to keep oxygen saturation above 90% -Blood culture x2negative to date -Continue Mucinex for chest congestion -CRP levels finally trending  down.  -Plan to wean oxygen levels and ambulate further as able.  I have asked for a PT evaluation.  Continue supplemental oxygen.  -CT chest done 9/2 with findings of severe bilateral infiltrates    COVID-19 viral gastroenteritis:Patient initially reported having nausea and a few episodes of vomiting at home. Endorsing poor oral intake. Transaminases elevated in the setting of acute viral illness. T bili normal. Abdominal exam benign. -Appreciate GI evaluation with transition to oral PPI twice daily -Continue on soft diet even on discharge -GI plans to follow-up in outpatient setting  Large pneumomediastinum - RESOLVED -Discussed case with CT surgery, esophagram and CT chest performed with no sign of extravasation noted -Continue soft diet -Okay to ambulate at this point -Avoid incentive spirometry and flutter valve -Plan for now is to follow-up with CT surgery in outpatient setting for monitoring, no need for surgical intervention -with increasing oxygen requirements, ordered a repeat CT chest to further evaluate.  -REPEATED CT CHEST DONE 9/3 SHOWS RESOLUTION  Elevated transaminases:Likely due to COVID-19 viral infection. -Continue to monitor LFTs  Mildly elevated alkaline phosphatase level -GGT elevated, but no significant findings on right upper quadrant ultrasound aside from fatty liver disease  Chronic pain syndrome -Resumed home Dilaudid as neededq6 hours for severe pain  Anxiety, depression -Continue home medications  MS -Continue home medications  DVT prophylaxis:Lovenox BID Code Status:Full Family Communication:Discussed with daughter [phone]/26, 8/27, 8/28, 8/29, 8/30, 8/31, husband 9/1, 9/3 Disposition Plan: Status is: Inpatient  Remains inpatient appropriate because:IV treatments appropriate due to  intensity of illness or inability to take PO and Inpatient level of care appropriate due to severity of illness.  Unfortunately pt has  developed increasing oxygen requirements, awaiting repeat CT chest, US doppler BLEs.    Dispo: The patient is from:Home Anticipated d/c is [facility] Anticipated d/c date is: >3 days Patient currently is not medically stable to d/c due to increasing oxygen requirement.   Consultants:  Discussed with CT Surgery 8/27  GI, now signed off  Procedures:  See below  Antimicrobials:  Anti-infectives (From admission, onward)   Start     Dose/Rate Route Frequency Ordered Stop   02/05/20 1000  remdesivir 100 mg in sodium chloride 0.9 % 100 mL IVPB        100 mg 200 mL/hr over 30 Minutes Intravenous Daily 02/04/20 0406 02/09/20 1201   02/04/20 0415  remdesivir 100 mg in sodium chloride 0.9 % 100 mL IVPB        100 mg 200 mL/hr over 30 Minutes Intravenous Every 30 min 02/04/20 0406 02/04/20 0522      Subjective: Patient wants to go home, she understands that she still requires high flow oxygen at 7L/min  Objective: Vitals:   02/12/20 0400 02/12/20 0500 02/12/20 0800 02/12/20 0905  BP: 129/72 128/72    Pulse: 96 99    Resp: 16 19    Temp:  99 F (37.2 C) 98 F (36.7 C)   TempSrc:  Oral Oral   SpO2: 92% 90%  92%  Weight:      Height:        Intake/Output Summary (Last 24 hours) at 02/12/2020 1119 Last data filed at 02/12/2020 0538 Gross per 24 hour  Intake --  Output 1800 ml  Net -1800 ml   Filed Weights   02/04/20 0138 02/11/20 0441  Weight: 113.3 kg 105.3 kg   Examination:  General exam: Appears calm and comfortable  Respiratory system:  On HFNC 7L, moderate increased WOB. Cardiovascular system: normal S1 & S2 heard no M/R/G.  Gastrointestinal system: Abdomen is nondistended, soft and nontender.  Central nervous system: Alert and oriented. No focal neurological deficits. Extremities: leg edema mild L>R. Skin: No rashes, lesions or ulcers Psychiatry: normal affect today.  Data Reviewed: I have personally reviewed following  labs and imaging studies  CBC: Recent Labs  Lab 02/06/20 0747 02/06/20 0747 02/07/20 0839 02/07/20 0839 02/08/20 0653 02/09/20 0637 02/10/20 0644 02/11/20 0544 02/12/20 0642  WBC 10.3   < > 9.6   < > 11.0* 13.5* 15.8* 10.8* 13.2*  NEUTROABS 8.9*  --  8.1*  --  9.5* 11.7*  --   --   --   HGB 11.5*   < > 11.5*   < > 11.5* 11.6* 11.5* 11.4* 11.3*  HCT 37.7   < > 37.0   < > 36.3 36.3 37.0 37.3 36.4  MCV 85.3   < > 83.7   < > 83.6 83.4 83.7 84.6 84.1  PLT 410*   < > 444*   < > 450* 472* 521* 518* 585*   < > = values in this interval not displayed.   Basic Metabolic Panel: Recent Labs  Lab 02/08/20 0653 02/09/20 0637 02/10/20 0644 02/11/20 0544 02/12/20 0642  NA 136 136 137 139 138  K 4.2 3.5 3.8 3.5 3.9  CL 100 99 98 101 100  CO2 27 24 25 27 26   GLUCOSE 102* 93 98 89 99  BUN 16 15 17 15 12   CREATININE 0.59 0.61 0.59 0.63  0.57  CALCIUM 8.4* 8.3* 8.5* 8.4* 8.5*   GFR: Estimated Creatinine Clearance: 108.1 mL/min (by C-G formula based on SCr of 0.57 mg/dL). Liver Function Tests: Recent Labs  Lab 02/08/20 0653 02/09/20 4098 02/10/20 0644 02/11/20 0544 02/12/20 0642  AST 30 33 23 19 18   ALT 46* 42 35 31 33  ALKPHOS 91 87 84 81 83  BILITOT 0.6 0.9 0.6 0.6 0.6  PROT 6.5 6.5 6.4* 6.3* 6.4*  ALBUMIN 2.6* 2.5* 2.6* 2.5* 2.5*   No results for input(s): LIPASE, AMYLASE in the last 168 hours. No results for input(s): AMMONIA in the last 168 hours. Coagulation Profile: No results for input(s): INR, PROTIME in the last 168 hours. Cardiac Enzymes: No results for input(s): CKTOTAL, CKMB, CKMBINDEX, TROPONINI in the last 168 hours. BNP (last 3 results) No results for input(s): PROBNP in the last 8760 hours. HbA1C: No results for input(s): HGBA1C in the last 72 hours. CBG: No results for input(s): GLUCAP in the last 168 hours. Lipid Profile: No results for input(s): CHOL, HDL, LDLCALC, TRIG, CHOLHDL, LDLDIRECT in the last 72 hours. Thyroid Function Tests: No results for  input(s): TSH, T4TOTAL, FREET4, T3FREE, THYROIDAB in the last 72 hours. Anemia Panel: No results for input(s): VITAMINB12, FOLATE, FERRITIN, TIBC, IRON, RETICCTPCT in the last 72 hours. Sepsis Labs: No results for input(s): PROCALCITON, LATICACIDVEN in the last 168 hours.  Recent Results (from the past 240 hour(s))  Blood Culture (routine x 2)     Status: None   Collection Time: 02/04/20  1:20 AM   Specimen: Right Antecubital; Blood  Result Value Ref Range Status   Specimen Description RIGHT ANTECUBITAL  Final   Special Requests   Final    BOTTLES DRAWN AEROBIC AND ANAEROBIC Blood Culture adequate volume   Culture   Final    NO GROWTH 5 DAYS Performed at [facility], [address], [place], [place]    Report Status 02/09/2020 FINAL  Final  Blood Culture (routine x 2)     Status: None   Collection Time: 02/04/20  3:27 AM   Specimen: BLOOD LEFT ARM  Result Value Ref Range Status   Specimen Description BLOOD LEFT ARM  Final   Special Requests   Final    BOTTLES DRAWN AEROBIC AND ANAEROBIC Blood Culture adequate volume   Culture   Final    NO GROWTH 5 DAYS Performed at [facility], [address] [place], [place]    Report Status 02/09/2020 FINAL  Final  MRSA PCR Screening     Status: None   Collection Time: 02/10/20  2:40 PM   Specimen: Nasopharyngeal  Result Value Ref Range Status   MRSA by PCR NEGATIVE NEGATIVE Final    Comment:        The GeneXpert MRSA Assay (FDA approved for NASAL specimens only), is one component of a comprehensive MRSA colonization surveillance program. It is not intended to diagnose MRSA infection nor to guide or monitor treatment for MRSA infections. Performed at [facility], [address] [place], [place]     Radiology Studies: CT CHEST WO CONTRAST  Result Date: 02/11/2020 CLINICAL DATA:  Chest pain and shortness of breath. EXAM: CT CHEST WITHOUT CONTRAST TECHNIQUE: Multidetector CT imaging of the chest was  performed following the standard protocol without IV contrast. COMPARISON:  February 05, 2020 FINDINGS: Cardiovascular: The left-sided venous Port-A-Cath is seen. No significant vascular findings. There is mild cardiomegaly. No pericardial effusion. Mediastinum/Nodes: The pneumomediastinum seen on the prior  study is no longer present. The no enlarged mediastinal or axillary lymph nodes. Thyroid gland, trachea, and esophagus demonstrate no significant findings. Lungs/Pleura: Marked severity partially ground-glass appearing infiltrates are seen throughout both lungs. This is mildly increased in severity within the right lower lobe and stable in severity throughout the remainder of both lungs. There is no evidence of a pleural effusion or pneumothorax. Upper Abdomen: There is a moderate-sized hiatal hernia. Surgical clips are seen within the gallbladder fossa. Musculoskeletal: No chest wall mass or suspicious bone lesions identified. IMPRESSION: 1. Marked severity bilateral infiltrates, mildly increased in severity within the right lower lobe when compared to the prior study. 2. Moderate-sized hiatal hernia. 3. Interval resolution of the pneumomediastinum seen on the prior study. 4. Evidence of prior cholecystectomy. Electronically Signed   By: Aram Candela M.D.   On: 02/11/2020 18:55   US Venous Img Lower Bilateral (DVT)  Result Date: 02/11/2020 CLINICAL DATA:  COVID positive.  Bilateral leg pain. EXAM: BILATERAL LOWER EXTREMITY VENOUS DOPPLER ULTRASOUND TECHNIQUE: Gray-scale sonography with compression, as well as color and duplex ultrasound, were performed to evaluate the deep venous system(s) from the level of the common femoral vein through the popliteal and proximal calf veins. COMPARISON:  None. FINDINGS: VENOUS Normal compressibility of the common femoral, superficial femoral, and popliteal veins, as well as the visualized calf veins. Visualized portions of profunda femoral vein and great saphenous vein  unremarkable. No filling defects to suggest DVT on grayscale or color Doppler imaging. Doppler waveforms show normal direction of venous flow, normal respiratory plasticity and response to augmentation. Limited views of the contralateral common femoral vein are unremarkable. OTHER None. Limitations: none IMPRESSION: Negative. Electronically Signed   By: Aram Candela M.D.   On: 02/11/2020 18:55   DG CHEST PORT 1 VIEW  Result Date: 02/10/2020 CLINICAL DATA:  Shortness of breath. EXAM: PORTABLE CHEST 1 VIEW COMPARISON:  February 04, 2020. FINDINGS: Stable cardiomediastinal silhouette. No pneumothorax or pleural effusion is noted. Left subclavian Port-A-Cath is unchanged in position. Mild bibasilar subsegmental atelectasis or possibly infiltrates are noted. Bony thorax is unremarkable. IMPRESSION: Mild bibasilar subsegmental atelectasis or infiltrates are noted. Electronically Signed   By: Lupita Raider M.D.   On: 02/10/2020 14:18   Scheduled Meds: . vitamin C  500 mg Oral Daily  . baclofen  10 mg Oral TID  . baricitinib  4 mg Oral Daily  . buPROPion  150 mg Oral BID  . Chlorhexidine Gluconate Cloth  6 each Topical Daily  . cholecalciferol  1,000 Units Oral Daily  . dextromethorphan-guaiFENesin  1 tablet Oral BID  . enoxaparin (LOVENOX) injection  40 mg Subcutaneous Q12H  . fluticasone  1 spray Each Nare Daily  . gabapentin  300 mg Oral TID  . Ipratropium-Albuterol  2 puff Inhalation Q6H WA  . loratadine  10 mg Oral Daily  . mouth rinse  15 mL Mouth Rinse BID  . methylPREDNISolone (SOLU-MEDROL) injection  55 mg Intravenous Q12H  . ondansetron (ZOFRAN) IV  4 mg Intravenous Q6H  . pantoprazole  40 mg Oral BID AC  . sertraline  25 mg Oral BID  . zinc sulfate  220 mg Oral Daily   Continuous Infusions: . lactated ringers 75 mL/hr at 02/11/20 2221    LOS: 8 days   Critical Care Procedure Note Authorized and Performed by: Maryln Manuel MD  Total Critical Care time:  34 mins Due to a high  probability of clinically significant, life threatening deterioration, the patient required my  highest level of preparedness to intervene emergently and I personally spent this critical care time directly and personally managing the patient.  This critical care time included obtaining a history; examining the patient, pulse oximetry; ordering and review of studies; arranging urgent treatment with development of a management plan; evaluation of patient's response of treatment; frequent reassessment; and discussions with other providers.  This critical care time was performed to assess and manage the high probability of imminent and life threatening deterioration that could result in multi-organ failure.  It was exclusive of separately billable procedures and treating other patients and teaching time.   Standley Dakins, MD How to contact the [facility] Attending or Consulting provider 7A - 7P or covering provider during after hours 7P -7A, for this patient?  1. Check the care team in [facility] and look for a) attending/consulting [facility] provider listed and b) the [employer] team listed 2. Log into [email or web address] and use [place]'s universal password to access. If you do not have the password, please contact the hospital operator. 3. Locate the [facility] provider you are looking for under [facility] and page to a number that you can be directly reached. 4. If you still have difficulty reaching the provider, please page the [facility] (Director on Call) for the Hospitalists listed on amion for assistance.  If 7PM-7AM, please contact night-coverage [email or web address] 02/12/2020, 11:19 AM

## 2020-02-12 NOTE — Plan of Care (Signed)
  Problem: Acute Rehab PT Goals(only PT should resolve) Goal: Pt Will Go Supine/Side To Sit Outcome: Progressing Flowsheets (Taken 02/12/2020 1544) Pt will go Supine/Side to Sit:  with modified independence  Independently Goal: Patient Will Transfer Sit To/From Stand Outcome: Progressing Flowsheets (Taken 02/12/2020 1544) Patient will transfer sit to/from stand: with supervision Goal: Pt Will Transfer Bed To Chair/Chair To Bed Outcome: Progressing Flowsheets (Taken 02/12/2020 1544) Pt will Transfer Bed to Chair/Chair to Bed: with supervision Goal: Pt Will Ambulate Outcome: Progressing Flowsheets (Taken 02/12/2020 1544) Pt will Ambulate:  50 feet  with supervision  with min guard assist  with rolling walker   3:45 PM, 02/12/20 Ocie Bob, MPT Physical Therapist with [facility] [phone] office [phone] mobile phone

## 2020-02-13 LAB — CBC
HCT: 36.2 % (ref 36.0–46.0)
Hemoglobin: 11.1 g/dL — ABNORMAL LOW (ref 12.0–15.0)
MCH: 26.1 pg (ref 26.0–34.0)
MCHC: 30.7 g/dL (ref 30.0–36.0)
MCV: 85 fL (ref 80.0–100.0)
Platelets: 580 10*3/uL — ABNORMAL HIGH (ref 150–400)
RBC: 4.26 MIL/uL (ref 3.87–5.11)
RDW: 14.4 % (ref 11.5–15.5)
WBC: 9.7 10*3/uL (ref 4.0–10.5)
nRBC: 0 % (ref 0.0–0.2)

## 2020-02-13 LAB — COMPREHENSIVE METABOLIC PANEL
ALT: 32 U/L (ref 0–44)
AST: 18 U/L (ref 15–41)
Albumin: 2.5 g/dL — ABNORMAL LOW (ref 3.5–5.0)
Alkaline Phosphatase: 78 U/L (ref 38–126)
Anion gap: 11 (ref 5–15)
BUN: 11 mg/dL (ref 6–20)
CO2: 28 mmol/L (ref 22–32)
Calcium: 8.4 mg/dL — ABNORMAL LOW (ref 8.9–10.3)
Chloride: 102 mmol/L (ref 98–111)
Creatinine, Ser: 0.57 mg/dL (ref 0.44–1.00)
GFR calc Af Amer: >60 mL/min (ref >60)
GFR calc non Af Amer: >60 mL/min (ref >60)
Glucose, Bld: 85 mg/dL (ref 70–99)
Potassium: 3.3 mmol/L — ABNORMAL LOW (ref 3.5–5.1)
Sodium: 141 mmol/L (ref 135–145)
Total Bilirubin: 0.7 mg/dL (ref 0.3–1.2)
Total Protein: 6 g/dL — ABNORMAL LOW (ref 6.5–8.1)

## 2020-02-13 LAB — C-REACTIVE PROTEIN: CRP: 1.6 mg/dL — ABNORMAL HIGH (ref <1.0)

## 2020-02-13 LAB — D-DIMER, QUANTITATIVE: D-Dimer, Quant: 0.96 ug/mL-FEU — ABNORMAL HIGH (ref 0.00–0.50)

## 2020-02-13 MED ORDER — POLYVINYL ALCOHOL 1.4 % OP SOLN: 1.0000 [drp] | Freq: Two times a day (BID) | OPHTHALMIC | Status: DC | PRN

## 2020-02-13 MED ORDER — POTASSIUM CHLORIDE CRYS ER 20 MEQ PO TBCR
40.0000 meq | EXTENDED_RELEASE_TABLET | Freq: Once | ORAL | Status: AC
  Administered 2020-02-13: 40 meq via ORAL
  Filled 2020-02-13: qty 2

## 2020-02-13 NOTE — Plan of Care (Signed)
  Problem: Education: Goal: Knowledge of risk factors and measures for prevention of condition will improve Outcome: Progressing   Problem: Respiratory: Goal: Will maintain a patent airway Outcome: Progressing   Problem: Safety: Goal: Ability to remain free from injury will improve Outcome: Progressing

## 2020-02-13 NOTE — Progress Notes (Addendum)
PROGRESS NOTE    Melinda Moore  ZOX:[id number] DOB: March 19, 1971 DOA: 02/04/2020 PCP: Melinda Sciara, MD  Brief Narrative:  Per HPI: Melinda Moore a 49 y.o.femalewith medical history significant ofMS, hypertension, anxiety, depression, Bell's palsy, DVT in 2013, fibromyalgia, trigeminal neuralgia presenting with complaints of shortness of breath after recently testing positive for Covid on 8/23 during an urgent care visit.Patient reports 5-day history of shortness of breath, fatigue,nausea and poor oral intake. Denies fevers, cough, or chest pain. States she has had some slight vomiting at home but is mostly feeling nauseous and has lost her appetite. She experienced some epigastric abdominal discomfort when she vomited but denies abdominal pain otherwise. Denies diarrhea. She has not been vaccinated against Covid.  8/26:Patient has been admitted for acute hypoxemic respiratory failure in the setting of COVID-19 pneumonia. Oxygen supplementation requirements have increased since admission up to 8 L this morning. She does not appear to be quite short of breath however. Patient has been started on Solu-Medrol as well as remdesivir. Plan to add bariticinibtoday in order to assist further with treatment given high O2 requirements as well as increase in inflammatory markers. Procalcitonin low, therefore avoid antibiotics. Patient does have some nausea and vomiting likely related to Covid. Patient noted to have elevated alkaline phosphatase as well as GGT and minimal increase in lipase. Plan for right upper quadrant ultrasound today and maintain on clear liquids as well as IV fluid for now. Spoke with daughterAnaya [phone].  8/27:Patient continues to have worsening hypoxemia as well as elevated D-dimer. Lovenox changed to twice daily. CT chest angiogram with no sign of PE, but unfortunately there does appear to be a large pneumomediastinum. Discussed case with CT surgery with barium  esophagram performed with no contrast extravasation noted. Patient down to 5 L nasal cannula at this time and otherwise appears comfortable. She is still having some trouble with nausea and vomiting intermittently.  8/28:Patient noted to have persistent hypoxemia and oxygen demand seems to have worsened to 10 L this a.m. She is also had quite a bit of chest congestion as well as indigestion, nausea, and vomiting despite scheduling of Zofran and Phenergan as needed. Continue clear liquid diet for now. Question the presence of a microperforation that would have contributed to large pneumomediastinum. Fortunately, imaging with barium esophagram on 8/27 with no acute findings noted. Appreciate GI consultation for assistance in management of persistent GI symptoms as well as concern for microperforation.  8/29:Patient noted to have improvement in hypoxemia noted this morning. She is down to 6 L nasal cannula and has not had a bowel movement in the last several days. She denies any further nausea or vomiting and has been coughing up mucus. Inflammatory markers downtrending.  8/30: Patient noted to still require 6 L nasal cannula oxygen this morning.  She is asking for suppository with no bowel movement noted yesterday.  Plan to advance diet after discussion with GI today.  No significant nausea or vomiting noted this morning.  8/31: Patient is eager for discharge, but continues to remain on 6 L nasal cannula oxygen.  She is able to tolerate her soft diet and has been transitioned to oral PPI twice daily by GI.  Continue on soft diet and plan to ambulate further today and wean oxygen as tolerated.  Continue to monitor CRP levels.  9/1:  Pt was going to be discharged, however she became severely dyspneic and tachycardic when home O2 eval was being performed. DC placed on hold.  Pt  now on 4L [place].   ** Update: Pt experienced further respiratory distress now on 10L HFNC.  She is transferred to stepdown  ICU.  I called and spoke with husband Mr. Lieber for update on change in status.  He wanted to know what treatments she was on and I told him.  He verbalized understanding.  Repeat CXR was ordered and viewed by me.  2nd bedside visit to reassess.  I also conferenced with RT.  No IS, flutter valve or chest PT for now due to pneumomediastinum.    9/2: Pt remains in stepdown ICU, now down to 7L HFNC, ordered repeat CT chest (shows resolution of pneumomediastinum) and doppler US BLEs negative for DVT.  9/3: PT eval recommending SNF however patient declines.   9/4: 6L HFNC, remains very weak.  Upset about not being medically stable to go home.   Assessment & Plan:   Principal Problem:   Pneumonia due to COVID-19 virus Active Problems:   Elevated alkaline phosphatase level   Chronic pain syndrome   Acute hypoxemic respiratory failure (HCC)   Elevated transaminase level   Acute respiratory failure due to COVID-19 [facility])   Acute hypoxemic respiratory failure due to COVID-19 [facility])   Acute respiratory failure with hypoxia (HCC)  Acute hypoxemic respiratory failure secondary to COVID-19 viral pneumonia: Tested positive for Covid on 02/01/2020. Febrile, tachycardic, and tachypneic. Oxygen saturation 86% on room air. Currently satting 89-90% on 7 L supplemental oxygen. Chest x-ray showing developing infiltrate in the left mid to lower lung field and right lung base. Bacterial pneumonia less likely given no leukocytosis or procalcitonin elevation. Inflammatory markers elevated: D-dimer 1.62, LDH 361, ferritin 233, fibrinogen 762, and CRP 17.4. Lactic acid normal x2. -Remdesivir dosing per pharmacy -IV Solu-Medrol 0.5 mg/kg every 12 hours -Vitamin C, zinc, vitamin D -Antitussives as needed -continue baricitinib as ordered.  -Tylenol as needed -Daily CBC with differential, CMP, CRP, D-dimer -Encourage prone positioning -Incentive spirometry, flutter valve -Airborne and contact  precautions -Continuous pulse ox -Supplemental oxygen as needed to keep oxygen saturation above 90% -Blood culture x2negative to date -Continue Mucinex for chest congestion -CRP levels finally trending down.  -Plan to wean oxygen levels and ambulate further as able.  I have asked for a PT evaluation.  Continue supplemental oxygen.  -CT chest done 9/2 with findings of severe bilateral infiltrates, resolution of the pneumomediastinum   COVID-19 viral gastroenteritis:Patient initially reported having nausea and a few episodes of vomiting at home. Endorsing poor oral intake. Transaminases elevated in the setting of acute viral illness. T bili normal. Abdominal exam benign. -Appreciate GI evaluation with transition to oral PPI twice daily -Continue on soft diet even on discharge -GI plans to follow-up in outpatient setting  Large pneumomediastinum - RESOLVED -Discussed case with CT surgery, esophagram and CT chest performed with no sign of extravasation noted -Continue soft diet -Okay to ambulate at this point -Avoid incentive spirometry and flutter valve -Plan for now is to follow-up with CT surgery in outpatient setting for monitoring, no need for surgical intervention -with increasing oxygen requirements, ordered a repeat CT chest to further evaluate.  -REPEATED CT CHEST DONE 9/3 SHOWS RESOLUTION  Elevated transaminases:Likely due to COVID-19 viral infection. -Continue to monitor LFTs  Mildly elevated alkaline phosphatase level -GGT elevated, but no significant findings on right upper quadrant ultrasound aside from fatty liver disease  Chronic pain syndrome -Resumed home Dilaudid as neededq6 hours for severe pain  Anxiety, depression -Continue home medications  MS -Continue home medications  DVT prophylaxis:Lovenox BID Code Status:Full Family Communication:Discussed with daughter [phone]/26, 8/27, 8/28, 8/29, 8/30, 8/31, husband 9/1, 9/3,  9/4 Disposition Plan:?LTAC, Pt declining SNF, possibly home with HH if we can get O2 down to 3L or less Status is: Inpatient  Remains inpatient appropriate because:IV treatments appropriate due to intensity of illness or inability to take PO and Inpatient level of care appropriate due to severity of illness.  Unfortunately pt has developed increasing oxygen requirements.    Dispo: The patient is from:Home Anticipated d/c is [facility] Anticipated d/c date is: >3 days Patient currently is not medically stable to d/c due to increasing oxygen requirement.   Consultants:  Discussed with CT Surgery 8/27  GI, now signed off  Procedures:  See below  Antimicrobials:  Anti-infectives (From admission, onward)   Start     Dose/Rate Route Frequency Ordered Stop   02/05/20 1000  remdesivir 100 mg in sodium chloride 0.9 % 100 mL IVPB        100 mg 200 mL/hr over 30 Minutes Intravenous Daily 02/04/20 0406 02/09/20 1201   02/04/20 0415  remdesivir 100 mg in sodium chloride 0.9 % 100 mL IVPB        100 mg 200 mL/hr over 30 Minutes Intravenous Every 30 min 02/04/20 0406 02/04/20 0522      Subjective: Patient upset not able to go home yet, remains on 6L HFNC.  No chest pain, ambulating to bathroom, declined SNF, remains very weak.   Objective: Vitals:   02/13/20 0400 02/13/20 0500 02/13/20 0600 02/13/20 0806  BP: 116/63 (!) 95/56 97/72   Pulse: 93 90 91   Resp: Temp:  98 F (36.7 C)    TempSrc:  Oral    SpO2: 95% 94% 96% 94%  Weight:      Height:        Intake/Output Summary (Last 24 hours) at 02/13/2020 1109 Last data filed at 02/13/2020 9562 Gross per 24 hour  Intake 3976.25 ml  Output 1050 ml  Net 2926.25 ml   Filed Weights   02/04/20 0138 02/11/20 0441  Weight: 113.3 kg 105.3 kg   Examination:  General exam: Appears calm and comfortable, no apparent distress.  Respiratory system:  On HFNC 6L, moderate increased WOB.   Rales heard bilateral.  Cardiovascular system: normal S1 & S2 heard no M/R/G.  Gastrointestinal system: Abdomen is nondistended, soft and nontender.  Central nervous system: Alert and oriented. No focal neurological deficits. Extremities: leg edema mild L>R.  Doppler US neg for DVT.  Skin: No rashes, lesions or ulcers Psychiatry: flat affect.  Data Reviewed: I have personally reviewed following labs and imaging studies  CBC: Recent Labs  Lab 02/07/20 0839 02/07/20 0839 02/08/20 0653 02/08/20 0653 02/09/20 0637 02/10/20 0644 02/11/20 0544 02/12/20 0642 02/13/20 0620  WBC 9.6   < > 11.0*   < > 13.5* 15.8* 10.8* 13.2* 9.7  NEUTROABS 8.1*  --  9.5*  --  11.7*  --   --   --   --   HGB 11.5*   < > 11.5*   < > 11.6* 11.5* 11.4* 11.3* 11.1*  HCT 37.0   < > 36.3   < > 36.3 37.0 37.3 36.4 36.2  MCV 83.7   < > 83.6   < > 83.4 83.7 84.6 84.1 85.0  PLT 444*   < > 450*   < > 472* 521* 518* 585* 580*   < > = values in this interval not displayed.  Basic Metabolic Panel: Recent Labs  Lab 02/09/20 0637 02/10/20 0644 02/11/20 0544 02/12/20 0642 02/13/20 0620  NA 136 137 139 138 141  K 3.5 3.8 3.5 3.9 3.3*  CL 99 98 101 100 102  CO2 24 25 27 26 28   GLUCOSE 93 98 89 99 85  BUN 15 17 15 12 11   CREATININE 0.61 0.59 0.63 0.57 0.57  CALCIUM 8.3* 8.5* 8.4* 8.5* 8.4*   GFR: Estimated Creatinine Clearance: 108.1 mL/min (by C-G formula based on SCr of 0.57 mg/dL). Liver Function Tests: Recent Labs  Lab 02/09/20 0637 02/10/20 0644 02/11/20 0544 02/12/20 0642 02/13/20 0620  AST 33 23 19 18 18   ALT 42 35 31 33 32  ALKPHOS 87 84 81 83 78  BILITOT 0.9 0.6 0.6 0.6 0.7  PROT 6.5 6.4* 6.3* 6.4* 6.0*  ALBUMIN 2.5* 2.6* 2.5* 2.5* 2.5*   No results for input(s): LIPASE, AMYLASE in the last 168 hours. No results for input(s): AMMONIA in the last 168 hours. Coagulation Profile: No results for input(s): INR, PROTIME in the last 168 hours. Cardiac Enzymes: No results for input(s):  CKTOTAL, CKMB, CKMBINDEX, TROPONINI in the last 168 hours. BNP (last 3 results) No results for input(s): PROBNP in the last 8760 hours. HbA1C: No results for input(s): HGBA1C in the last 72 hours. CBG: No results for input(s): GLUCAP in the last 168 hours. Lipid Profile: No results for input(s): CHOL, HDL, LDLCALC, TRIG, CHOLHDL, LDLDIRECT in the last 72 hours. Thyroid Function Tests: No results for input(s): TSH, T4TOTAL, FREET4, T3FREE, THYROIDAB in the last 72 hours. Anemia Panel: No results for input(s): VITAMINB12, FOLATE, FERRITIN, TIBC, IRON, RETICCTPCT in the last 72 hours. Sepsis Labs: No results for input(s): PROCALCITON, LATICACIDVEN in the last 168 hours.  Recent Results (from the past 240 hour(s))  Blood Culture (routine x 2)     Status: None   Collection Time: 02/04/20  1:20 AM   Specimen: Right Antecubital; Blood  Result Value Ref Range Status   Specimen Description RIGHT ANTECUBITAL  Final   Special Requests   Final    BOTTLES DRAWN AEROBIC AND ANAEROBIC Blood Culture adequate volume   Culture   Final    NO GROWTH 5 DAYS Performed at [facility], [address] [place], [facility] [address]    Report Status 02/09/2020 FINAL  Final  Blood Culture (routine x 2)     Status: None   Collection Time: 02/04/20  3:27 AM   Specimen: BLOOD LEFT ARM  Result Value Ref Range Status   Specimen Description BLOOD LEFT ARM  Final   Special Requests   Final    BOTTLES DRAWN AEROBIC AND ANAEROBIC Blood Culture adequate volume   Culture   Final    NO GROWTH 5 DAYS Performed at [facility], [address], [place], [facility] [address]    Report Status 02/09/2020 FINAL  Final  MRSA PCR Screening     Status: None   Collection Time: 02/10/20  2:40 PM   Specimen: Nasopharyngeal  Result Value Ref Range Status   MRSA by PCR NEGATIVE NEGATIVE Final    Comment:        The GeneXpert MRSA Assay (FDA approved for NASAL specimens only), is one component of a comprehensive MRSA  colonization surveillance program. It is not intended to diagnose MRSA infection nor to guide or monitor treatment for MRSA infections. Performed at [facility], [address], [employer] [place], [facility] [address]     Radiology Studies: CT CHEST WO CONTRAST  Result Date: 02/11/2020 CLINICAL DATA:  Chest pain and shortness of breath. EXAM: CT CHEST WITHOUT CONTRAST TECHNIQUE: Multidetector CT imaging of the chest was performed following the standard protocol without IV contrast. COMPARISON:  February 05, 2020 FINDINGS: Cardiovascular: The left-sided venous Port-A-Cath is seen. No significant vascular findings. There is mild cardiomegaly. No pericardial effusion. Mediastinum/Nodes: The pneumomediastinum seen on the prior study is no longer present. The no enlarged mediastinal or axillary lymph nodes. Thyroid gland, trachea, and esophagus demonstrate no significant findings. Lungs/Pleura: Marked severity partially ground-glass appearing infiltrates are seen throughout both lungs. This is mildly increased in severity within the right lower lobe and stable in severity throughout the remainder of both lungs. There is no evidence of a pleural effusion or pneumothorax. Upper Abdomen: There is a moderate-sized hiatal hernia. Surgical clips are seen within the gallbladder fossa. Musculoskeletal: No chest wall mass or suspicious bone lesions identified. IMPRESSION: 1. Marked severity bilateral infiltrates, mildly increased in severity within the right lower lobe when compared to the prior study. 2. Moderate-sized hiatal hernia. 3. Interval resolution of the pneumomediastinum seen on the prior study. 4. Evidence of prior cholecystectomy. Electronically Signed   By: Aram Candela M.D.   On: 02/11/2020 18:55   US Venous Img Lower Bilateral (DVT)  Result Date: 02/11/2020 CLINICAL DATA:  COVID positive.  Bilateral leg pain. EXAM: BILATERAL LOWER EXTREMITY VENOUS DOPPLER ULTRASOUND TECHNIQUE: Gray-scale sonography with  compression, as well as color and duplex ultrasound, were performed to evaluate the deep venous system(s) from the level of the common femoral vein through the popliteal and proximal calf veins. COMPARISON:  None. FINDINGS: VENOUS Normal compressibility of the common femoral, superficial femoral, and popliteal veins, as well as the visualized calf veins. Visualized portions of profunda femoral vein and great saphenous vein unremarkable. No filling defects to suggest DVT on grayscale or color Doppler imaging. Doppler waveforms show normal direction of venous flow, normal respiratory plasticity and response to augmentation. Limited views of the contralateral common femoral vein are unremarkable. OTHER None. Limitations: none IMPRESSION: Negative. Electronically Signed   By: Aram Candela M.D.   On: 02/11/2020 18:55   Scheduled Meds:  vitamin C  500 mg Oral Daily   baclofen  10 mg Oral TID   baricitinib  4 mg Oral Daily   buPROPion  150 mg Oral BID   Chlorhexidine Gluconate Cloth  6 each Topical Daily   cholecalciferol  1,000 Units Oral Daily   dextromethorphan-guaiFENesin  1 tablet Oral BID   enoxaparin (LOVENOX) injection  40 mg Subcutaneous Q12H   fluticasone  1 spray Each Nare Daily   gabapentin  300 mg Oral TID   Ipratropium-Albuterol  2 puff Inhalation Q6H WA   loratadine  10 mg Oral Daily   mouth rinse  15 mL Mouth Rinse BID   methylPREDNISolone (SOLU-MEDROL) injection  55 mg Intravenous Q12H   ondansetron (ZOFRAN) IV  4 mg Intravenous Q6H   pantoprazole  40 mg Oral BID AC   potassium chloride  40 mEq Oral Once   sertraline  25 mg Oral BID   zinc sulfate  220 mg Oral Daily   Continuous Infusions:  lactated ringers 75 mL/hr at 02/12/20 2218    LOS: 9 days   Critical Care Procedure Note Authorized and Performed by: Maryln Manuel MD  Total Critical Care time:  31 mins Due to a high probability of clinically significant, life threatening deterioration, the patient  required my highest level of preparedness to intervene emergently and I  personally spent this critical care time directly and personally managing the patient.  This critical care time included obtaining a history; examining the patient, pulse oximetry; ordering and review of studies; arranging urgent treatment with development of a management plan; evaluation of patient's response of treatment; frequent reassessment; and discussions with other providers.  This critical care time was performed to assess and manage the high probability of imminent and life threatening deterioration that could result in multi-organ failure.  It was exclusive of separately billable procedures and treating other patients and teaching time.   Standley Dakins, MD How to contact the [facility] Attending or Consulting provider 7A - 7P or covering provider during after hours 7P -7A, for this patient?  1. Check the care team in [facility] and look for a) attending/consulting [facility] provider listed and b) the [facility] team listed 2. Log into [email or web address] and use [place]'s universal password to access. If you do not have the password, please contact the hospital operator. 3. Locate the [facility] provider you are looking for under [facility] and page to a number that you can be directly reached. 4. If you still have difficulty reaching the provider, please page the [facility] (Director on Call) for the Hospitalists listed on amion for assistance.  If 7PM-7AM, please contact night-coverage [email or web address] 02/13/2020, 11:09 AM

## 2020-02-14 LAB — COMPREHENSIVE METABOLIC PANEL
ALT: 35 U/L (ref 0–44)
AST: 17 U/L (ref 15–41)
Albumin: 2.7 g/dL — ABNORMAL LOW (ref 3.5–5.0)
Alkaline Phosphatase: 82 U/L (ref 38–126)
Anion gap: 10 (ref 5–15)
BUN: 10 mg/dL (ref 6–20)
CO2: 28 mmol/L (ref 22–32)
Calcium: 8.5 mg/dL — ABNORMAL LOW (ref 8.9–10.3)
Chloride: 102 mmol/L (ref 98–111)
Creatinine, Ser: 0.55 mg/dL (ref 0.44–1.00)
GFR calc Af Amer: >60 mL/min (ref >60)
GFR calc non Af Amer: >60 mL/min (ref >60)
Glucose, Bld: 90 mg/dL (ref 70–99)
Potassium: 3.4 mmol/L — ABNORMAL LOW (ref 3.5–5.1)
Sodium: 140 mmol/L (ref 135–145)
Total Bilirubin: 0.6 mg/dL (ref 0.3–1.2)
Total Protein: 6.2 g/dL — ABNORMAL LOW (ref 6.5–8.1)

## 2020-02-14 LAB — CBC
HCT: 36 % (ref 36.0–46.0)
Hemoglobin: 11.1 g/dL — ABNORMAL LOW (ref 12.0–15.0)
MCH: 26.3 pg (ref 26.0–34.0)
MCHC: 30.8 g/dL (ref 30.0–36.0)
MCV: 85.3 fL (ref 80.0–100.0)
Platelets: 542 10*3/uL — ABNORMAL HIGH (ref 150–400)
RBC: 4.22 MIL/uL (ref 3.87–5.11)
RDW: 14.5 % (ref 11.5–15.5)
WBC: 10 10*3/uL (ref 4.0–10.5)
nRBC: 0 % (ref 0.0–0.2)

## 2020-02-14 LAB — C-REACTIVE PROTEIN: CRP: 0.8 mg/dL (ref <1.0)

## 2020-02-14 LAB — D-DIMER, QUANTITATIVE: D-Dimer, Quant: 0.96 ug/mL-FEU — ABNORMAL HIGH (ref 0.00–0.50)

## 2020-02-14 MED ORDER — ONDANSETRON HCL 4 MG/2ML IJ SOLN: 4.0000 mg | Freq: Four times a day (QID) | INTRAMUSCULAR | Status: DC | PRN

## 2020-02-14 MED ORDER — POTASSIUM CHLORIDE CRYS ER 20 MEQ PO TBCR
40.0000 meq | EXTENDED_RELEASE_TABLET | Freq: Once | ORAL | Status: AC
  Administered 2020-02-14: 40 meq via ORAL
  Filled 2020-02-14: qty 2

## 2020-02-14 NOTE — Progress Notes (Addendum)
PROGRESS NOTE    Melinda Moore  TMH:[id number] DOB: [birth year] DOA: 02/04/2020 PCP: Babs Sciara, MD  Brief Narrative:  Per HPI: Melinda Moore a 49 y.o.femalewith medical history significant ofMS, hypertension, anxiety, depression, Bell's palsy, DVT in 2013, fibromyalgia, trigeminal neuralgia presenting with complaints of shortness of breath after recently testing positive for Covid on 8/23 during an urgent care visit.Patient reports 5-day history of shortness of breath, fatigue,nausea and poor oral intake. Denies fevers, cough, or chest pain. States she has had some slight vomiting at home but is mostly feeling nauseous and has lost her appetite. She experienced some epigastric abdominal discomfort when she vomited but denies abdominal pain otherwise. Denies diarrhea. She has not been vaccinated against Covid.  8/26:Patient has been admitted for acute hypoxemic respiratory failure in the setting of COVID-19 pneumonia. Oxygen supplementation requirements have increased since admission up to 8 L this morning. She does not appear to be quite short of breath however. Patient has been started on Solu-Medrol as well as remdesivir. Plan to add bariticinibtoday in order to assist further with treatment given high O2 requirements as well as increase in inflammatory markers. Procalcitonin low, therefore avoid antibiotics. Patient does have some nausea and vomiting likely related to Covid. Patient noted to have elevated alkaline phosphatase as well as GGT and minimal increase in lipase. Plan for right upper quadrant ultrasound today and maintain on clear liquids as well as IV fluid for now. Spoke with daughterAnaya [phone].  8/27:Patient continues to have worsening hypoxemia as well as elevated D-dimer. Lovenox changed to twice daily. CT chest angiogram with no sign of PE, but unfortunately there does appear to be a large pneumomediastinum. Discussed case with CT surgery with barium  esophagram performed with no contrast extravasation noted. Patient down to 5 L nasal cannula at this time and otherwise appears comfortable. She is still having some trouble with nausea and vomiting intermittently.  8/28:Patient noted to have persistent hypoxemia and oxygen demand seems to have worsened to 10 L this a.m. She is also had quite a bit of chest congestion as well as indigestion, nausea, and vomiting despite scheduling of Zofran and Phenergan as needed. Continue clear liquid diet for now. Question the presence of a microperforation that would have contributed to large pneumomediastinum. Fortunately, imaging with barium esophagram on 8/27 with no acute findings noted. Appreciate GI consultation for assistance in management of persistent GI symptoms as well as concern for microperforation.  8/29:Patient noted to have improvement in hypoxemia noted this morning. She is down to 6 L nasal cannula and has not had a bowel movement in the last several days. She denies any further nausea or vomiting and has been coughing up mucus. Inflammatory markers downtrending.  8/30: Patient noted to still require 6 L nasal cannula oxygen this morning.  She is asking for suppository with no bowel movement noted yesterday.  Plan to advance diet after discussion with GI today.  No significant nausea or vomiting noted this morning.  8/31: Patient is eager for discharge, but continues to remain on 6 L nasal cannula oxygen.  She is able to tolerate her soft diet and has been transitioned to oral PPI twice daily by GI.  Continue on soft diet and plan to ambulate further today and wean oxygen as tolerated.  Continue to monitor CRP levels.  9/1:  Pt was going to be discharged, however she became severely dyspneic and tachycardic when home O2 eval was being performed. DC placed on hold.  Pt  now on 4L [place].   ** Update: Pt experienced further respiratory distress now on 10L HFNC.  She is transferred to stepdown  ICU.  I called and spoke with husband Mr. Kloos for update on change in status.  He wanted to know what treatments she was on and I told him.  He verbalized understanding.  Repeat CXR was ordered and viewed by me.  2nd bedside visit to reassess.  I also conferenced with RT.  No IS, flutter valve or chest PT for now due to pneumomediastinum.    9/2: Pt remains in stepdown ICU, now down to 7L HFNC, ordered repeat CT chest (shows resolution of pneumomediastinum) and doppler US BLEs negative for DVT.  9/3: PT eval recommending SNF however patient declines.   9/4: 6L HFNC, remains very weak.  Upset about not being medically stable to go home.   9/5: 3L HFNC, motivated to go home, proning more and trying to ambulate more, still declines SNF.     Assessment & Plan:   Principal Problem:   Pneumonia due to COVID-19 virus Active Problems:   Elevated alkaline phosphatase level   Chronic pain syndrome   Acute hypoxemic respiratory failure (HCC)   Elevated transaminase level   Acute respiratory failure due to COVID-19 [facility])   Acute hypoxemic respiratory failure due to COVID-19 [facility])   Acute respiratory failure with hypoxia (HCC)  Acute hypoxemic respiratory failure secondary to COVID-19 viral pneumonia: Tested positive for Covid on 02/01/2020. Febrile, tachycardic, and tachypneic. Oxygen saturation 86% on room air. Currently satting 89-90% on 7 L supplemental oxygen. Chest x-ray showing developing infiltrate in the left mid to lower lung field and right lung base. Bacterial pneumonia less likely given no leukocytosis or procalcitonin elevation. Inflammatory markers elevated: D-dimer 1.62, LDH 361, ferritin 233, fibrinogen 762, and CRP 17.4. Lactic acid normal x2. -Remdesivir dosing per pharmacy -IV Solu-Medrol 0.5 mg/kg every 12 hours -Vitamin C, zinc, vitamin D -Antitussives as needed -continue baricitinib as ordered.  -Tylenol as needed -Daily CBC with differential, CMP, CRP,  D-dimer -Encouraged prone positioning -Incentive spirometry, flutter valve -Airborne and contact precautions -Continuous pulse ox -Supplemental oxygen as needed to keep oxygen saturation above 90% -Blood culture x2negative to date -Continue Mucinex for chest congestion -CRP levels finally trending down.  -Plan to wean oxygen levels and ambulate further as able.  I have asked for a PT evaluation.  Continue supplemental oxygen.  -CT chest done 9/2 with findings of severe bilateral infiltrates, resolution of the pneumomediastinum -Pt down to 3L HFNC.  If she can remain stable on this level of support, she likely could discharge home 9/6.     COVID-19 viral gastroenteritis:Patient initially reported having nausea and a few episodes of vomiting at home. Endorsing poor oral intake. Transaminases elevated in the setting of acute viral illness. T bili normal. Abdominal exam benign. -Appreciate GI evaluation with transition to oral PPI twice daily -Continue on soft diet even on discharge -GI plans to follow-up in outpatient setting  Large pneumomediastinum - RESOLVED -Discussed case with CT surgery, esophagram and CT chest performed with no sign of extravasation noted -Ambulating encouraged as much as able.  -Plan for now is to follow-up with CT surgery in outpatient setting, no need for surgical intervention -with increasing oxygen requirements, ordered a repeat CT chest to further evaluate.  -REPEATED CT CHEST DONE 9/3 SHOWS RESOLUTION  Elevated transaminases: RESOLVED   - thought due to Covid 19 infection - LFTs normalized now  Mildly elevated alkaline phosphatase level -  GGT elevated, but no significant findings on right upper quadrant ultrasound aside from fatty liver disease  Chronic pain syndrome -Resumed home Dilaudid as neededq6 hours for severe pain  Anxiety, depression -Continue home medications  MS -Continue home medications  DVT prophylaxis:Lovenox  BID Code Status:Full Family Communication:Discussed with daughter [phone]/26, 8/27, 8/28, 8/29, 8/30, 8/31, husband 9/1, 9/3, 9/4 Disposition Plan: Pt declining SNF, possibly home with [facility] 9/6 Status is: Inpatient  Remains inpatient appropriate because:IV treatments appropriate due to intensity of illness or inability to take PO and Inpatient level of care appropriate due to severity of illness.  Unfortunately pt has developed increasing oxygen requirements.    Dispo: The patient is from:Home Anticipated d/c is YQ:MVHQ Anticipated d/c date is: >3 days Patient currently is not medically stable to d/c due to increasing oxygen requirement.   Consultants:  Discussed with CT Surgery 8/27  GI, now signed off  Procedures:  See below  Antimicrobials:  Anti-infectives (From admission, onward)   Start     Dose/Rate Route Frequency Ordered Stop   02/05/20 1000  remdesivir 100 mg in sodium chloride 0.9 % 100 mL IVPB        100 mg 200 mL/hr over 30 Minutes Intravenous Daily 02/04/20 0406 02/09/20 1201   02/04/20 0415  remdesivir 100 mg in sodium chloride 0.9 % 100 mL IVPB        100 mg 200 mL/hr over 30 Minutes Intravenous Every 30 min 02/04/20 0406 02/04/20 0522      Subjective: Patient in better spirits now that oxygen has come down to 3L/min.  She has been proning more and using IS more and wants to get home as soon as possible.  She denies any specific complaints.    Objective: Vitals:   02/14/20 0800 02/14/20 0900 02/14/20 1000 02/14/20 1100  BP: 103/69 (!) 112/57 107/63 106/72  Pulse: 93 (!) 103 (!) 116 (!) 122  Resp: 14 (!) 23 (!) 26 (!) 26  Temp:      TempSrc:      SpO2: 97% 95% 96% 91%  Weight:      Height:        Intake/Output Summary (Last 24 hours) at 02/14/2020 1124 Last data filed at 02/14/2020 0900 Gross per 24 hour  Intake 240 ml  Output 1950 ml  Net -1710 ml   Filed Weights   02/04/20 0138 02/11/20  0441 02/14/20 0500  Weight: 113.3 kg 105.3 kg 104.5 kg   Examination:  General exam: Appears calm and comfortable, no apparent distress.  Respiratory system:  HFNC 3L, mild increased WOB.  Rales heard bilateral.  Cardiovascular system: normal S1 & S2 heard no M/R/G. Mild tachy.  Gastrointestinal system: Abdomen is nondistended, soft and nontender.  Central nervous system: Alert and oriented. No focal neurological deficits. Extremities: leg edema mild L>R.  Doppler US neg for DVT.  Skin: No rashes, lesions or ulcers Psychiatry: flat affect.  Data Reviewed: I have personally reviewed following labs and imaging studies  CBC: Recent Labs  Lab 02/08/20 0653 02/08/20 0653 02/09/20 0637 02/09/20 0637 02/10/20 0644 02/11/20 0544 02/12/20 0642 02/13/20 0620 02/14/20 0558  WBC 11.0*   < > 13.5*   < > 15.8* 10.8* 13.2* 9.7 10.0  NEUTROABS 9.5*  --  11.7*  --   --   --   --   --   --   HGB 11.5*   < > 11.6*   < > 11.5* 11.4* 11.3* 11.1* 11.1*  HCT 36.3   < >  36.3   < > 37.0 37.3 36.4 36.2 36.0  MCV 83.6   < > 83.4   < > 83.7 84.6 84.1 85.0 85.3  PLT 450*   < > 472*   < > 521* 518* 585* 580* 542*   < > = values in this interval not displayed.   Basic Metabolic Panel: Recent Labs  Lab 02/10/20 0644 02/11/20 0544 02/12/20 0642 02/13/20 0620 02/14/20 0558  NA 137 139 138 141 140  K 3.8 3.5 3.9 3.3* 3.4*  CL 98 101 100 102 102  CO2 25 27 26 28 28   GLUCOSE 98 89 99 85 90  BUN 17 15 12 11 10   CREATININE 0.59 0.63 0.57 0.57 0.55  CALCIUM 8.5* 8.4* 8.5* 8.4* 8.5*   GFR: Estimated Creatinine Clearance: 107.6 mL/min (by C-G formula based on SCr of 0.55 mg/dL). Liver Function Tests: Recent Labs  Lab 02/10/20 0644 02/11/20 0544 02/12/20 0642 02/13/20 0620 02/14/20 0558  AST 23 19 18 18 17   ALT 35 31 33 32 35  ALKPHOS 84 81 83 78 82  BILITOT 0.6 0.6 0.6 0.7 0.6  PROT 6.4* 6.3* 6.4* 6.0* 6.2*  ALBUMIN 2.6* 2.5* 2.5* 2.5* 2.7*   No results for input(s): LIPASE, AMYLASE in  the last 168 hours. No results for input(s): AMMONIA in the last 168 hours. Coagulation Profile: No results for input(s): INR, PROTIME in the last 168 hours. Cardiac Enzymes: No results for input(s): CKTOTAL, CKMB, CKMBINDEX, TROPONINI in the last 168 hours. BNP (last 3 results) No results for input(s): PROBNP in the last 8760 hours. HbA1C: No results for input(s): HGBA1C in the last 72 hours. CBG: No results for input(s): GLUCAP in the last 168 hours. Lipid Profile: No results for input(s): CHOL, HDL, LDLCALC, TRIG, CHOLHDL, LDLDIRECT in the last 72 hours. Thyroid Function Tests: No results for input(s): TSH, T4TOTAL, FREET4, T3FREE, THYROIDAB in the last 72 hours. Anemia Panel: No results for input(s): VITAMINB12, FOLATE, FERRITIN, TIBC, IRON, RETICCTPCT in the last 72 hours. Sepsis Labs: No results for input(s): PROCALCITON, LATICACIDVEN in the last 168 hours.  Recent Results (from the past 240 hour(s))  MRSA PCR Screening     Status: None   Collection Time: 02/10/20  2:40 PM   Specimen: Nasopharyngeal  Result Value Ref Range Status   MRSA by PCR NEGATIVE NEGATIVE Final    Comment:        The GeneXpert MRSA Assay (FDA approved for NASAL specimens only), is one component of a comprehensive MRSA colonization surveillance program. It is not intended to diagnose MRSA infection nor to guide or monitor treatment for MRSA infections. Performed at [facility], [address], [place], [place] [address]     Radiology Studies: No results found. Scheduled Meds: . vitamin C  500 mg Oral Daily  . baclofen  10 mg Oral TID  . baricitinib  4 mg Oral Daily  . buPROPion  150 mg Oral BID  . Chlorhexidine Gluconate Cloth  6 each Topical Daily  . cholecalciferol  1,000 Units Oral Daily  . dextromethorphan-guaiFENesin  1 tablet Oral BID  . enoxaparin (LOVENOX) injection  40 mg Subcutaneous Q12H  . fluticasone  1 spray Each Nare Daily  . gabapentin  300 mg Oral TID  .  Ipratropium-Albuterol  2 puff Inhalation Q6H WA  . loratadine  10 mg Oral Daily  . mouth rinse  15 mL Mouth Rinse BID  . ondansetron (ZOFRAN) IV  4 mg Intravenous Q6H  . pantoprazole  40  mg Oral BID AC  . sertraline  25 mg Oral BID  . zinc sulfate  220 mg Oral Daily   Continuous Infusions: . lactated ringers 10 mL/hr at 02/13/20 1147    LOS: 10 days   Critical Care Procedure Note Authorized and Performed by: Maryln Manuel MD  Total Critical Care time:  35 mins Due to a high probability of clinically significant, life threatening deterioration, the patient required my highest level of preparedness to intervene emergently and I personally spent this critical care time directly and personally managing the patient.  This critical care time included obtaining a history; examining the patient, pulse oximetry; ordering and review of studies; arranging urgent treatment with development of a management plan; evaluation of patient's response of treatment; frequent reassessment; and discussions with other providers.  This critical care time was performed to assess and manage the high probability of imminent and life threatening deterioration that could result in multi-organ failure.  It was exclusive of separately billable procedures and treating other patients and teaching time.   Standley Dakins, MD How to contact the [facility] Attending or Consulting provider 7A - 7P or covering provider during after hours 7P -7A, for this patient?  1. Check the care team in [facility] and look for a) attending/consulting [facility] provider listed and b) the [facility] team listed 2. Log into [email or web address] and use [place]'s universal password to access. If you do not have the password, please contact the hospital operator. 3. Locate the [facility] provider you are looking for under [facility] and page to a number that you can be directly reached. 4. If you still have difficulty reaching the provider, please page the [facility] (Director on Call)  for the Hospitalists listed on amion for assistance.  If 7PM-7AM, please contact night-coverage [email or web address] 02/14/2020, 11:24 AM

## 2020-02-15 MED ORDER — ALBUTEROL SULFATE HFA 108 (90 BASE) MCG/ACT IN AERS: 2.0000 | INHALATION_SPRAY | RESPIRATORY_TRACT | 1 refills | Status: DC | PRN

## 2020-02-15 MED ORDER — ASCORBIC ACID 500 MG PO TABS: 500.0000 mg | ORAL_TABLET | Freq: Every day | ORAL | 0 refills | Status: AC

## 2020-02-15 MED ORDER — ZINC SULFATE 220 (50 ZN) MG PO CAPS: 220.0000 mg | ORAL_CAPSULE | Freq: Every day | ORAL | 0 refills | Status: AC

## 2020-02-15 MED ORDER — IPRATROPIUM-ALBUTEROL 20-100 MCG/ACT IN AERS: 2.0000 | INHALATION_SPRAY | Freq: Four times a day (QID) | RESPIRATORY_TRACT | 1 refills | Status: DC

## 2020-02-15 MED ORDER — GUAIFENESIN-DM 100-10 MG/5ML PO SYRP: 5.0000 mL | ORAL_SOLUTION | ORAL | 0 refills | Status: DC | PRN

## 2020-02-15 NOTE — Discharge Summary (Signed)
Physician Discharge Summary  Melinda Moore IRC:[id number] DOB: 04-27-1971 DOA: 02/04/2020  PCP: Kathyrn Drown, MD  Admit date: 02/04/2020 Discharge date: 02/15/2020  Admitted From:  Home  Disposition: Home with home health   Recommendations for Outpatient Follow-up:  1. Follow up with PCP in 2 weeks 2. Follow up with CT surgery regarding pneumomediastinum  Home Health: PT, RN, resp care, SW  Discharge Condition: STABLE  CODE STATUS: FULL    Brief Hospitalization Summary: Please see all hospital notes, images, labs for full details of the hospitalization. ADMISSION HPI: Melinda Moore is a 49 y.o. female with medical history significant of MS, hypertension, anxiety, depression, Bell's palsy, DVT in 2013, fibromyalgia, trigeminal neuralgia presenting with complaints of shortness of breath after recently testing positive for Covid on 8/23 during an urgent care visit.  Patient reports 5-day history of shortness of breath, fatigue, nausea and poor oral intake.  Denies fevers, cough, or chest pain.  States she has had some slight vomiting at home but is mostly feeling nauseous and has lost her appetite.  She experienced some epigastric abdominal discomfort when she vomited but denies abdominal pain otherwise.  Denies diarrhea.  She has not been vaccinated against Covid.  ED Course: Temperature 100.7 F.  Tachycardic and tachypneic.  Oxygen saturation 86% on room air, improved with 5 L supplemental oxygen.  Labs showing no leukocytosis.  Hemoglobin 11.6, at baseline.  Platelet count normal.  No significant electrolyte derangements and renal function at baseline.  Transaminases elevated (AST 100, ALT 74).  Alk phos mildly elevated at 132.  T bili normal.  Lactic acid normal x2.  Blood culture x2 pending.  Procalcitonin <0.10. Inflammatory markers elevated: D-dimer 1.62, LDH 361, ferritin 233, fibrinogen 762, and CRP 17.4.  Chest x-ray showing developing infiltrate in the left mid to lower lung field and  right lung base.    Prolonged Hospitalization   8/26:Patient has been admitted for acute hypoxemic respiratory failure in the setting of COVID-19 pneumonia. Oxygen supplementation requirements have increased since admission up to 8 L this morning. She does not appear to be quite short of breath however. Patient has been started on Solu-Medrol as well as remdesivir. Plan to add bariticinibtoday in order to assist further with treatment given high O2 requirements as well as increase in inflammatory markers. Procalcitonin low, therefore avoid antibiotics. Patient does have some nausea and vomiting likely related to Covid. Patient noted to have elevated alkaline phosphatase as well as GGT and minimal increase in lipase. Plan for right upper quadrant ultrasound today and maintain on clear liquids as well as IV fluid for now. Spoke with daughterAnaya [phone].  8/27:Patient continues to have worsening hypoxemia as well as elevated D-dimer. Lovenox changed to twice daily. CT chest angiogram with no sign of PE, but unfortunately there does appear to be a large pneumomediastinum. Discussed case with CT surgery with barium esophagram performed with no contrast extravasation noted. Patient down to 5 L nasal cannula at this time and otherwise appears comfortable. She is still having some trouble with nausea and vomiting intermittently.  8/28:Patient noted to have persistent hypoxemia and oxygen demand seems to have worsened to 10 L this a.m. She is also had quite a bit of chest congestion as well as indigestion, nausea, and vomiting despite scheduling of Zofran and Phenergan as needed. Continue clear liquid diet for now. Question the presence of a microperforation that would have contributed to large pneumomediastinum. Fortunately, imaging with barium esophagram on 8/27 with no acute  findings noted. Appreciate GI consultation for assistance in management of persistent GI symptoms as well as concern  for microperforation.  8/29:Patient noted to have improvement in hypoxemia noted this morning. She is down to 6 L nasal cannula and has not had a bowel movement in the last several days. She denies any further nausea or vomiting and has been coughing up mucus. Inflammatory markers downtrending.  8/30:Patient noted to still require 6 L nasal cannula oxygen this morning. She is asking for suppository with no bowel movement noted yesterday. Plan to advance diet after discussion with GI today. No significant nausea or vomiting noted this morning.  8/31: Patient is eager for discharge, but continues to remain on 6 L nasal cannula oxygen.  She is able to tolerate her soft diet and has been transitioned to oral PPI twice daily by GI.  Continue on soft diet and plan to ambulate further today and wean oxygen as tolerated.  Continue to monitor CRP levels.  9/1:  Pt was going to be discharged, however she became severely dyspneic and tachycardic when home O2 eval was being performed. DC placed on hold.  Pt now on 4L [place].   * Update: Pt experienced further respiratory distress now on 10L HFNC.  She is transferred to stepdown ICU.  I called and spoke with husband Mr. Shawgo for update on change in status.  He wanted to know what treatments she was on and I told him.  He verbalized understanding.  Repeat CXR was ordered and viewed by me.  2nd bedside visit to reassess.  I also conferenced with RT.  No IS, flutter valve or chest PT for now due to pneumomediastinum.   9/2: Pt remains in stepdown ICU, now down to 7L HFNC, ordered repeat CT chest (shows resolution of pneumomediastinum) and doppler US BLEs negative for DVT.  9/3: PT eval recommending SNF however patient declines.   9/4: 6L HFNC, remains very weak.  Upset about not being medically stable to go home.   9/5: 3L HFNC, motivated to go home, proning more and trying to ambulate more, still declines SNF.   9/6: Pt remains stable on 3L [place].  She is  stable to discharge home on oxygen.  Pt declines SNF.  HH ordered.     Acute hypoxemic respiratory failure secondary to COVID-19 viral pneumonia: Tested positive for Covid on 02/01/2020. Febrile, tachycardic, and tachypneic. Oxygen saturation 86% on room air. Currently satting 89-90% on 7 L supplemental oxygen. Chest x-ray showing developing infiltrate in the left mid to lower lung field and right lung base. Bacterial pneumonia less likely given no leukocytosis or procalcitonin elevation. Inflammatory markers elevated: D-dimer 1.62, LDH 361, ferritin 233, fibrinogen 762, and CRP 17.4. Lactic acid normal x2. -Remdesivir dosing per pharmacy -IV Solu-Medrol 0.5 mg/kg every 12 hours -Vitamin C, zinc, vitamin D -Antitussives as needed -continue baricitinib as ordered.  -Tylenol as needed -Daily CBC with differential, CMP, CRP, D-dimer -Encouraged prone positioning -Incentive spirometry, flutter valve -Airborne and contact precautions -Continuous pulse ox -Supplemental oxygen as needed to keep oxygen saturation above 90% -Blood culture x2negative to date -Continue Mucinex for chest congestion -CRP levels finally trending down.  -Plan to wean oxygen levels and ambulate further as able.  I have asked for a PT evaluation.  Continue supplemental oxygen.  -CT chest done 9/2 with findings of severe bilateral infiltrates, resolution of the pneumomediastinum -Pt down to 3L HFNC.  Pt stable to discharge home 9/6.     COVID-19 viral gastroenteritis:Patient initially  reported having nausea and a few episodes of vomiting at home. Endorsing poor oral intake. Transaminases elevated in the setting of acute viral illness. T bili normal. Abdominal exam benign. -Appreciate GI evaluation with transition to oral PPI twice daily -Continue on soft diet even on discharge -GI plans to follow-up in outpatient setting  Large pneumomediastinum - RESOLVED -Discussed case with CT surgery, esophagram and CT  chest performed with no sign of extravasation noted -Ambulating encouraged as much as able.  -Plan for now is to follow-up with CT surgery in outpatient setting, no need for surgical intervention -with increasing oxygen requirements, ordered a repeat CT chest to further evaluate.  -REPEATED CT CHEST DONE 9/3 SHOWS RESOLUTION  Elevated transaminases: RESOLVED   - thought due to Covid 19 infection - LFTs normalized now  Mildly elevated alkaline phosphatase level -GGT elevated, but no significant findings on right upper quadrant ultrasound aside from fatty liver disease  Chronic pain syndrome -Resumed home Dilaudid as neededq6 hours for severe pain  Anxiety, depression -Continue home medications  MS -Continue home medications  DVT prophylaxis:Lovenox BID Code Status:Full Family Communication:Discussed with daughter [phone]/26, 8/27, 8/28, 8/29, 8/30, 8/31, husband 9/1, 9/3, 9/4 Disposition Plan: Pt declining SNF, home with [facility] 9/6  Discharge Diagnoses:  Principal Problem:   Pneumonia due to COVID-19 virus Active Problems:   Elevated alkaline phosphatase level   Chronic pain syndrome   Acute hypoxemic respiratory failure (HCC)   Elevated transaminase level   Acute respiratory failure due to COVID-19 [facility])   Acute hypoxemic respiratory failure due to COVID-19 [facility])   Acute respiratory failure with hypoxia [facility])  Discharge Instructions: Discharge Instructions    Ambulatory referral to Cardiothoracic Surgery   Complete by: As directed      Allergies as of 02/15/2020      Reactions   Ambien [zolpidem Tartrate]    nightmares   Tape Itching, Rash      Medication List    STOP taking these medications   benzonatate 100 MG capsule Commonly known as: TESSALON   predniSONE 10 MG tablet Commonly known as: DELTASONE     TAKE these medications   albuterol 108 (90 Base) MCG/ACT inhaler Commonly known as: VENTOLIN HFA Inhale 2 puffs into the lungs every 4  (four) hours as needed for wheezing or shortness of breath (cough, shortness of breath or wheezing.).   ascorbic acid 500 MG tablet Commonly known as: VITAMIN C Take 1 tablet (500 mg total) by mouth daily.   baclofen 10 MG tablet Commonly known as: LIORESAL Take 1 tablet (10 mg total) by mouth 3 (three) times daily. As needed for muscle spasm   Bioflex Tabs Take 2 tablets by mouth daily.   Blink Tears 0.25 % Soln Generic drug: Polyethylene Glycol 400 Place 1 drop into the right eye 2 (two) times daily as needed (dry eyes).   buPROPion 150 MG 12 hr tablet Commonly known as: WELLBUTRIN SR TAKE ONE (1) TABLET BY MOUTH 3 TIMES DAILY   carbamazepine 200 MG 12 hr tablet Commonly known as: TEGRETOL XR Take 1 tablet (200 mg total) by mouth 3 (three) times daily as needed.   cetirizine 10 MG tablet Commonly known as: ZyrTEC Allergy Take 1 tablet (10 mg total) by mouth daily.   Emergen-C Immune Pack Take 1 packet by mouth once a week.   fluticasone 50 MCG/ACT nasal spray Commonly known as: FLONASE Place 1 spray into both nostrils daily for 14 days.   gabapentin 300 MG capsule Commonly known  as: NEURONTIN Take 1 capsule (300 mg total) by mouth 3 (three) times daily. What changed: how much to take   Gilenya 0.5 MG Caps Generic drug: Fingolimod HCl Take 0.5 mg by mouth daily.   guaiFENesin-dextromethorphan 100-10 MG/5ML syrup Commonly known as: ROBITUSSIN DM Take 5 mLs by mouth every 4 (four) hours as needed for cough.   HYDROmorphone 4 MG tablet Commonly known as: Dilaudid Take one tablet po q 4 hrs prn severe pain. No more than 4.5 tablets per day   Ipratropium-Albuterol 20-100 MCG/ACT Aers respimat Commonly known as: COMBIVENT Inhale 2 puffs into the lungs every 6 (six) hours.   LUTEIN PO Take 1 tablet by mouth daily.   multivitamin with minerals Tabs tablet Take 1 tablet by mouth daily. ALIVE   naloxone 4 MG/0.1ML Liqd nasal spray kit Commonly known as:  NARCAN Use as directed if accidental overdose   ondansetron 4 MG disintegrating tablet Commonly known as: ZOFRAN-ODT Take 1 tablet (4 mg total) by mouth every 8 (eight) hours as needed for nausea or vomiting.   pantoprazole 40 MG tablet Commonly known as: PROTONIX Take 40 mg by mouth daily.   potassium chloride SA 20 MEQ tablet Commonly known as: KLOR-CON TAKE ONE TABLET BY MOUTH TWICE A DAY What changed: when to take this   sertraline 50 MG tablet Commonly known as: ZOLOFT Take 0.5 tablets (25 mg total) by mouth 2 (two) times daily.   Vitamin D3 125 MCG (5000 UT) Tabs Take 20,000 Units by mouth 2 (two) times daily.   zinc sulfate 220 (50 Zn) MG capsule Take 1 capsule (220 mg total) by mouth daily.            Durable Medical Equipment  (From admission, onward)         Start     Ordered   02/15/20 0717  For home use only DME oxygen  Once       Question Answer Comment  Length of Need 6 Months   Mode or (Route) Nasal cannula   Liters per Minute 3   Frequency Continuous (stationary and portable oxygen unit needed)   Oxygen conserving device Yes   Oxygen delivery system Gas      02/15/20 0717          Follow-up Information    Kathyrn Drown, MD. Schedule an appointment as soon as possible for a visit in 2 week(s).   Specialty: Family Medicine Contact information: [place] [address] [phone]              Allergies  Allergen Reactions  . Ambien [Zolpidem Tartrate]     nightmares  . Tape Itching and Rash   Allergies as of 02/15/2020      Reactions   Ambien [zolpidem Tartrate]    nightmares   Tape Itching, Rash      Medication List    STOP taking these medications   benzonatate 100 MG capsule Commonly known as: TESSALON   predniSONE 10 MG tablet Commonly known as: DELTASONE     TAKE these medications   albuterol 108 (90 Base) MCG/ACT inhaler Commonly known as: VENTOLIN HFA Inhale 2 puffs into the lungs  every 4 (four) hours as needed for wheezing or shortness of breath (cough, shortness of breath or wheezing.).   ascorbic acid 500 MG tablet Commonly known as: VITAMIN C Take 1 tablet (500 mg total) by mouth daily.   baclofen 10 MG tablet Commonly known as: LIORESAL Take  1 tablet (10 mg total) by mouth 3 (three) times daily. As needed for muscle spasm   Bioflex Tabs Take 2 tablets by mouth daily.   Blink Tears 0.25 % Soln Generic drug: Polyethylene Glycol 400 Place 1 drop into the right eye 2 (two) times daily as needed (dry eyes).   buPROPion 150 MG 12 hr tablet Commonly known as: WELLBUTRIN SR TAKE ONE (1) TABLET BY MOUTH 3 TIMES DAILY   carbamazepine 200 MG 12 hr tablet Commonly known as: TEGRETOL XR Take 1 tablet (200 mg total) by mouth 3 (three) times daily as needed.   cetirizine 10 MG tablet Commonly known as: ZyrTEC Allergy Take 1 tablet (10 mg total) by mouth daily.   Emergen-C Immune Pack Take 1 packet by mouth once a week.   fluticasone 50 MCG/ACT nasal spray Commonly known as: FLONASE Place 1 spray into both nostrils daily for 14 days.   gabapentin 300 MG capsule Commonly known as: NEURONTIN Take 1 capsule (300 mg total) by mouth 3 (three) times daily. What changed: how much to take   Gilenya 0.5 MG Caps Generic drug: Fingolimod HCl Take 0.5 mg by mouth daily.   guaiFENesin-dextromethorphan 100-10 MG/5ML syrup Commonly known as: ROBITUSSIN DM Take 5 mLs by mouth every 4 (four) hours as needed for cough.   HYDROmorphone 4 MG tablet Commonly known as: Dilaudid Take one tablet po q 4 hrs prn severe pain. No more than 4.5 tablets per day   Ipratropium-Albuterol 20-100 MCG/ACT Aers respimat Commonly known as: COMBIVENT Inhale 2 puffs into the lungs every 6 (six) hours.   LUTEIN PO Take 1 tablet by mouth daily.   multivitamin with minerals Tabs tablet Take 1 tablet by mouth daily. ALIVE   naloxone 4 MG/0.1ML Liqd nasal spray kit Commonly known as:  NARCAN Use as directed if accidental overdose   ondansetron 4 MG disintegrating tablet Commonly known as: ZOFRAN-ODT Take 1 tablet (4 mg total) by mouth every 8 (eight) hours as needed for nausea or vomiting.   pantoprazole 40 MG tablet Commonly known as: PROTONIX Take 40 mg by mouth daily.   potassium chloride SA 20 MEQ tablet Commonly known as: KLOR-CON TAKE ONE TABLET BY MOUTH TWICE A DAY What changed: when to take this   sertraline 50 MG tablet Commonly known as: ZOLOFT Take 0.5 tablets (25 mg total) by mouth 2 (two) times daily.   Vitamin D3 125 MCG (5000 UT) Tabs Take 20,000 Units by mouth 2 (two) times daily.   zinc sulfate 220 (50 Zn) MG capsule Take 1 capsule (220 mg total) by mouth daily.            Durable Medical Equipment  (From admission, onward)         Start     Ordered   02/15/20 0717  For home use only DME oxygen  Once       Question Answer Comment  Length of Need 6 Months   Mode or (Route) Nasal cannula   Liters per Minute 3   Frequency Continuous (stationary and portable oxygen unit needed)   Oxygen conserving device Yes   Oxygen delivery system Gas      02/15/20 0717          Procedures/Studies: CT CHEST WO CONTRAST  Result Date: 02/11/2020 CLINICAL DATA:  Chest pain and shortness of breath. EXAM: CT CHEST WITHOUT CONTRAST TECHNIQUE: Multidetector CT imaging of the chest was performed following the standard protocol without IV contrast. COMPARISON:  February 05, 2020  FINDINGS: Cardiovascular: The left-sided venous Port-A-Cath is seen. No significant vascular findings. There is mild cardiomegaly. No pericardial effusion. Mediastinum/Nodes: The pneumomediastinum seen on the prior study is no longer present. The no enlarged mediastinal or axillary lymph nodes. Thyroid gland, trachea, and esophagus demonstrate no significant findings. Lungs/Pleura: Marked severity partially ground-glass appearing infiltrates are seen throughout both lungs. This is  mildly increased in severity within the right lower lobe and stable in severity throughout the remainder of both lungs. There is no evidence of a pleural effusion or pneumothorax. Upper Abdomen: There is a moderate-sized hiatal hernia. Surgical clips are seen within the gallbladder fossa. Musculoskeletal: No chest wall mass or suspicious bone lesions identified. IMPRESSION: 1. Marked severity bilateral infiltrates, mildly increased in severity within the right lower lobe when compared to the prior study. 2. Moderate-sized hiatal hernia. 3. Interval resolution of the pneumomediastinum seen on the prior study. 4. Evidence of prior cholecystectomy. Electronically Signed   By: Virgina Norfolk M.D.   On: 02/11/2020 18:55   CT CHEST WO CONTRAST  Result Date: 02/05/2020 CLINICAL DATA:  Pneumomediastinum on preceding CT angio chest, repeat CT imaging following water-soluble esophagram EXAM: CT CHEST WITHOUT CONTRAST TECHNIQUE: Multidetector CT imaging of the chest was performed following the standard protocol without IV contrast. Sagittal and coronal MPR images reconstructed from axial data set. COMPARISON:  Earlier CT angio chest 02/05/2020 FINDINGS: Cardiovascular: Aorta normal caliber. LEFT subclavian Port-A-Cath, tip SVC. No pericardial effusion. Minimal enlargement of cardiac chambers. Mediastinum/Nodes: Again identified pneumomediastinum extending into base of cervical region. Esophagus contains a small amount of refluxed contrast from the known hiatal hernia. No contrast extravasation identified. Lungs/Pleura: Extensive patchy BILATERAL airspace infiltrates consistent with multifocal pneumonia. No pleural effusion or pneumothorax. Large bleb medial RIGHT lower lobe. Upper Abdomen: Prior gastric sleeve resection. Remaining visualized upper abdomen unremarkable. Musculoskeletal: No acute osseous findings. IMPRESSION: Again identified pneumomediastinum extending into base of cervical region. No contrast  extravasation from the esophagus identified. Hiatal hernia. Extensive BILATERAL airspace infiltrates consistent with multifocal pneumonia. Prior gastric sleeve resection. Electronically Signed   By: Lavonia Dana M.D.   On: 02/05/2020 15:11   CT ANGIO CHEST PE W OR WO CONTRAST  Result Date: 02/05/2020 CLINICAL DATA:  Shortness of breath. COVID positive. Elevated D-dimer. EXAM: CT ANGIOGRAPHY CHEST WITH CONTRAST TECHNIQUE: Multidetector CT imaging of the chest was performed using the standard protocol during bolus administration of intravenous contrast. Multiplanar CT image reconstructions and MIPs were obtained to evaluate the vascular anatomy. CONTRAST:  119m OMNIPAQUE IOHEXOL 350 MG/ML SOLN COMPARISON:  No comparison studies available. FINDINGS: Cardiovascular: The heart size is normal. Trace pericardial effusion. Right Port-A-Cath tip is positioned in the upper right atrium. No evidence for filling defect in the opacified pulmonary arteries to suggest the presence of an acute pulmonary embolus Mediastinum/Nodes: Large volume pneumomediastinum evident. No mediastinal lymphadenopathy. There is no hilar lymphadenopathy. The esophagus has normal imaging features. Small hiatal hernia noted in this patient status post sleeve gastrectomy. There is no axillary lymphadenopathy. Lungs/Pleura: Patchy areas of ground-glass opacity are noted in the lungs bilaterally with confluent regional ground-glass disease in the left upper, left lower, and basilar right lower lobes. No pleural effusion. Upper Abdomen: Small hypodensity in the anterior spleen is too small to characterize but likely benign. Musculoskeletal: No worrisome lytic or sclerotic osseous abnormality. Review of the MIP images confirms the above findings. IMPRESSION: 1. No CT evidence for acute pulmonary embolus. 2. Large volume pneumomediastinum, new since yesterday's chest x-ray. This is probably related  to the underlying COVID lung disease. No obvious  esophageal wall thickening or mediastinal fluid to suggest esophageal perforation. If esophageal injury is a concern, contrast esophagram could be used to further evaluate. 3. Patchy areas of ground-glass opacity in the lungs bilaterally with confluent regional ground-glass disease in the left upper, left lower, and basilar right lower lobes. Imaging features compatible with multifocal pneumonia in this patient with a history of COVID-19. 4. Status post sleeve gastrectomy with small hiatal hernia. Electronically Signed   By: Misty Stanley M.D.   On: 02/05/2020 09:15   US Venous Img Lower Bilateral (DVT)  Result Date: 02/11/2020 CLINICAL DATA:  COVID positive.  Bilateral leg pain. EXAM: BILATERAL LOWER EXTREMITY VENOUS DOPPLER ULTRASOUND TECHNIQUE: Gray-scale sonography with compression, as well as color and duplex ultrasound, were performed to evaluate the deep venous system(s) from the level of the common femoral vein through the popliteal and proximal calf veins. COMPARISON:  None. FINDINGS: VENOUS Normal compressibility of the common femoral, superficial femoral, and popliteal veins, as well as the visualized calf veins. Visualized portions of profunda femoral vein and great saphenous vein unremarkable. No filling defects to suggest DVT on grayscale or color Doppler imaging. Doppler waveforms show normal direction of venous flow, normal respiratory plasticity and response to augmentation. Limited views of the contralateral common femoral vein are unremarkable. OTHER None. Limitations: none IMPRESSION: Negative. Electronically Signed   By: Virgina Norfolk M.D.   On: 02/11/2020 18:55   DG CHEST PORT 1 VIEW  Result Date: 02/10/2020 CLINICAL DATA:  Shortness of breath. EXAM: PORTABLE CHEST 1 VIEW COMPARISON:  February 04, 2020. FINDINGS: Stable cardiomediastinal silhouette. No pneumothorax or pleural effusion is noted. Left subclavian Port-A-Cath is unchanged in position. Mild bibasilar subsegmental atelectasis  or possibly infiltrates are noted. Bony thorax is unremarkable. IMPRESSION: Mild bibasilar subsegmental atelectasis or infiltrates are noted. Electronically Signed   By: Marijo Conception M.D.   On: 02/10/2020 14:18   DG Chest Portable 1 View  Result Date: 02/04/2020 CLINICAL DATA:  49 year old female with positive COVID-19. EXAM: PORTABLE CHEST 1 VIEW COMPARISON:  Chest radiograph dated 10/25/2015. FINDINGS: Port-A-Cath with tip in similar position at the cavoatrial junction. Left mid to lower lung field hazy airspace opacities as well as minimal right lung base densities may represent developing infiltrate. Clinical correlation is recommended. No large pleural effusion. There is no pneumothorax. Stable cardiac silhouette. No acute osseous pathology. IMPRESSION: Findings may represent developing infiltrate in the left mid to lower lung field and right lung base. Electronically Signed   By: Anner Crete M.D.   On: 02/04/2020 02:11   US Abdomen Limited RUQ  Result Date: 02/04/2020 CLINICAL DATA:  Nausea vomiting.  Abnormal LFTs. EXAM: ULTRASOUND ABDOMEN LIMITED RIGHT UPPER QUADRANT COMPARISON:  06/16/2007. FINDINGS: Gallbladder: Cholecystectomy. Common bile duct: Diameter: 3 mm Liver: Increased echogenicity consistent fatty infiltration or hepatocellular disease. No focal hepatic abnormality identified. Portal vein is patent on color Doppler imaging with normal direction of blood flow towards the liver. Other: None. IMPRESSION: 1.  Prior cholecystectomy.  No biliary distention. 2. Increased hepatic echogenicity consistent fatty infiltration or hepatocellular disease. No focal hepatic abnormality identified. Electronically Signed   By: Marcello Moores  Register   On: 02/04/2020 13:32   DG ESOPHAGUS W SINGLE CM (SOL OR THIN BA)  Result Date: 02/05/2020 CLINICAL DATA:  Pneumomediastinum on CT question esophageal perforation EXAM: ESOPHOGRAM/BARIUM SWALLOW TECHNIQUE: Single contrast examination was performed using  water soluble contrast. FLUOROSCOPY TIME:  Fluoroscopy Time:  1 minutes 48  seconds Radiation Exposure Index (if provided by the fluoroscopic device): 49.5 mGy Number of Acquired Spot Images: multiple fluoroscopic screen captures COMPARISON:  CT angio chest 02/05/2020 FINDINGS: Patient swallowed Omnipaque 300 and imaging was performed. Study performed with patient supine, LPO, and RPO with head of bed elevated 20 degrees Normal esophageal motility. Small hiatal hernia. Prominent gastroesophageal reflux to the level of the clavicular heads. No contrast extravasation identified. No areas of esophageal wall irregularity, narrowing, or intraluminal filling defects seen. IMPRESSION: No esophageal injuries identified. Hiatal hernia with significant gastroesophageal reflux. Electronically Signed   By: Lavonia Dana M.D.   On: 02/05/2020 15:03      Subjective: Pt says she is feeling well. She wants to go home.  She has strong family support.  She says that she would be agreeable to accept home health services.    Discharge Exam: Vitals:   02/15/20 0700 02/15/20 0800  BP: 108/62   Pulse: 98 99  Resp: 14 17  Temp:    SpO2: 99% 97%   Vitals:   02/15/20 0500 02/15/20 0600 02/15/20 0700 02/15/20 0800  BP: 90/63 98/62 108/62   Pulse: 95 94 98 99  Resp: '12 13 14 17  ' Temp:      TempSrc:      SpO2: 96% 96% 99% 97%  Weight: 104.9 kg     Height:       General exam: Appears calm and comfortable, no apparent distress.  Respiratory system:  Petrey 3L, No increased WOB.   Cardiovascular system: normal S1 & S2 heard no M/R/G. Mild tachy.  Gastrointestinal system: Abdomen is nondistended, soft and nontender.  Central nervous system: Alert and oriented. No focal neurological deficits. Extremities: leg edema mild L>R.  Doppler US neg for DVT.  Skin: No rashes, lesions or ulcers Psychiatry: normal affect.   The results of significant diagnostics from this hospitalization (including imaging, microbiology, ancillary  and laboratory) are listed below for reference.     Microbiology: Recent Results (from the past 240 hour(s))  MRSA PCR Screening     Status: None   Collection Time: 02/10/20  2:40 PM   Specimen: Nasopharyngeal  Result Value Ref Range Status   MRSA by PCR NEGATIVE NEGATIVE Final    Comment:        The GeneXpert MRSA Assay (FDA approved for NASAL specimens only), is one component of a comprehensive MRSA colonization surveillance program. It is not intended to diagnose MRSA infection nor to guide or monitor treatment for MRSA infections. Performed at [facility], [address] [place], [place] [address]     Labs: BNP (last 3 results) No results for input(s): BNP in the last 8760 hours. Basic Metabolic Panel: Recent Labs  Lab 02/10/20 0644 02/11/20 0544 02/12/20 0642 02/13/20 0620 02/14/20 0558  NA 137 139 138 141 140  K 3.8 3.5 3.9 3.3* 3.4*  CL 98 101 100 102 102  CO2 '25 27 26 28 28  ' GLUCOSE 98 89 99 85 90  BUN '17 15 12 11 10  ' CREATININE 0.59 0.63 0.57 0.57 0.55  CALCIUM 8.5* 8.4* 8.5* 8.4* 8.5*   Liver Function Tests: Recent Labs  Lab 02/10/20 0644 02/11/20 0544 02/12/20 0642 02/13/20 0620 02/14/20 0558  AST '23 19 18 18 17  ' ALT 35 31 33 32 35  ALKPHOS 84 81 83 78 82  BILITOT 0.6 0.6 0.6 0.7 0.6  PROT 6.4* 6.3* 6.4* 6.0* 6.2*  ALBUMIN 2.6* 2.5* 2.5* 2.5* 2.7*   No results for input(s):  LIPASE, AMYLASE in the last 168 hours. No results for input(s): AMMONIA in the last 168 hours. CBC: Recent Labs  Lab 02/09/20 0637 02/09/20 3976 02/10/20 0644 02/11/20 0544 02/12/20 0642 02/13/20 0620 02/14/20 0558  WBC 13.5*   < > 15.8* 10.8* 13.2* 9.7 10.0  NEUTROABS 11.7*  --   --   --   --   --   --   HGB 11.6*   < > 11.5* 11.4* 11.3* 11.1* 11.1*  HCT 36.3   < > 37.0 37.3 36.4 36.2 36.0  MCV 83.4   < > 83.7 84.6 84.1 85.0 85.3  PLT 472*   < > 521* 518* 585* 580* 542*   < > = values in this interval not displayed.   Cardiac Enzymes: No results for  input(s): CKTOTAL, CKMB, CKMBINDEX, TROPONINI in the last 168 hours. BNP: Invalid input(s): POCBNP CBG: No results for input(s): GLUCAP in the last 168 hours. D-Dimer Recent Labs    02/13/20 0620 02/14/20 0558  DDIMER 0.96* 0.96*   Hgb A1c No results for input(s): HGBA1C in the last 72 hours. Lipid Profile No results for input(s): CHOL, HDL, LDLCALC, TRIG, CHOLHDL, LDLDIRECT in the last 72 hours. Thyroid function studies No results for input(s): TSH, T4TOTAL, T3FREE, THYROIDAB in the last 72 hours.  Invalid input(s): FREET3 Anemia work up No results for input(s): VITAMINB12, FOLATE, FERRITIN, TIBC, IRON, RETICCTPCT in the last 72 hours. Urinalysis    Component Value Date/Time   COLORURINE AMBER (A) 01/31/2019 1350   APPEARANCEUR HAZY (A) 01/31/2019 1350   LABSPEC 1.011 01/31/2019 1350   PHURINE 5.0 01/31/2019 1350   GLUCOSEU NEGATIVE 01/31/2019 1350   HGBUR NEGATIVE 01/31/2019 1350   BILIRUBINUR NEGATIVE 01/31/2019 1350   KETONESUR NEGATIVE 01/31/2019 1350   PROTEINUR NEGATIVE 01/31/2019 1350   UROBILINOGEN 2.0 (H) 03/26/2011 1918   NITRITE POSITIVE (A) 01/31/2019 1350   LEUKOCYTESUR NEGATIVE 01/31/2019 1350   Sepsis Labs Invalid input(s): PROCALCITONIN,  WBC,  LACTICIDVEN Microbiology Recent Results (from the past 240 hour(s))  MRSA PCR Screening     Status: None   Collection Time: 02/10/20  2:40 PM   Specimen: Nasopharyngeal  Result Value Ref Range Status   MRSA by PCR NEGATIVE NEGATIVE Final    Comment:        The GeneXpert MRSA Assay (FDA approved for NASAL specimens only), is one component of a comprehensive MRSA colonization surveillance program. It is not intended to diagnose MRSA infection nor to guide or monitor treatment for MRSA infections. Performed at [facility], [address], [place], [place] [address]    Time coordinating discharge:  46 mins  SIGNED:  Irwin Brakeman, MD  [facility] 02/15/2020, 8:07 AM How to contact the [facility]  Attending or Consulting provider [place] or covering provider during after hours Icard, for this patient?  1. Check the care team in [facility] and look for a) attending/consulting TR[facility] provider listed and b) the [facility] team listed 2. Log into [email or web address] and use [place]'s universal password to access. If you do not have the password, please contact the hospital operator. 3. Locate the [facility] provider you are looking for under [facility] and page to a number that you can be directly reached. 4. If you still have difficulty reaching the provider, please page the [facility] (Director on Call) for the Hospitalists listed on amion for assistance.

## 2020-02-15 NOTE — TOC Transition Note (Signed)
Transition of Care [facility]) - CM/SW Discharge Note   Patient Details  Name: Melinda Moore MRN: [id number] Date of Birth: 11/14/1970  [facility] CM/SW Contact:  Annice Needy, LCSW Phone Number: 02/15/2020, 1:49 PM   Clinical Narrative:    Patient needs home oxygen. [facility] has inventory currently. Order placed. Oxygen concentrator and portables will be delivered to home and family will be advised by [employer] to bring portable when picking patient up.  [facility] accepts patient's insurance today and agreeable to [facility].    Final next level of care: Home/Self Care Barriers to Discharge: No Barriers Identified   Patient Goals and CMS Choice        Discharge Placement                       Discharge Plan and Services                DME Arranged: Oxygen DME Agency: [facility] Date DME Agency Contacted: 02/15/20 Time DME Agency Contacted: 1349 Representative spoke with at DME Agency: Fayrene Fearing HH Arranged: RN, PT, Respirator Therapy, Social Work [facility] Agency: [facility] Date HH Agency Contacted: 02/15/20 Time HH Agency Contacted: 1349 Representative spoke with at [facility] Agency: Bonita Quin  Social Determinants of Health (SDOH) Interventions     Readmission Risk Interventions No flowsheet data found.

## 2020-02-15 NOTE — Progress Notes (Addendum)
SATURATION QUALIFICATIONS: (This note is used to comply with regulatory documentation for home oxygen)  Patient Saturations on Room Air at Rest = 88%  Patient Saturations on Room Air while Ambulating = 85%  Patient Saturations on 3 Liters of oxygen while at Rest= 96-98%  Please briefly explain why patient needs home oxygen: Dyspnea with exertion and decreased resting/ambulatory pulse oximetry.

## 2020-02-15 NOTE — Discharge Instructions (Signed)
10 Things You Can Do to Manage Your COVID-19 Symptoms at Home If you have possible or confirmed COVID-19: 1. Stay home from work and school. And stay away from other public places. If you must go out, avoid using any kind of public transportation, ridesharing, or taxis. 2. Monitor your symptoms carefully. If your symptoms get worse, call your healthcare provider immediately. 3. Get rest and stay hydrated. 4. If you have a medical appointment, call the healthcare provider ahead of time and tell them that you have or may have COVID-19. 5. For medical emergencies, call 911 and notify the dispatch personnel that you have or may have COVID-19. 6. Cover your cough and sneezes with a tissue or use the inside of your elbow. 7. Wash your hands often with soap and water for at least 20 seconds or clean your hands with an alcohol-based hand sanitizer that contains at least 60% alcohol. 8. As much as possible, stay in a specific room and away from other people in your home. Also, you should use a separate bathroom, if available. If you need to be around other people in or outside of the home, wear a mask. 9. Avoid sharing personal items with other people in your household, like dishes, towels, and bedding. 10. Clean all surfaces that are touched often, like counters, tabletops, and doorknobs. Use household cleaning sprays or wipes according to the label instructions. cdc.gov/coronavirus 12/10/2018 This information is not intended to replace advice given to you by your health care provider. Make sure you discuss any questions you have with your health care provider. Document Revised: 05/14/2019 Document Reviewed: 05/14/2019 Elsevier Patient Education  2020 Elsevier Inc.   COVID-19 COVID-19 is a respiratory infection that is caused by a virus called severe acute respiratory syndrome coronavirus 2 (SARS-CoV-2). The disease is also known as coronavirus disease or novel coronavirus. In some people, the virus may  not cause any symptoms. In others, it may cause a serious infection. The infection can get worse quickly and can lead to complications, such as:  Pneumonia, or infection of the lungs.  Acute respiratory distress syndrome or ARDS. This is a condition in which fluid build-up in the lungs prevents the lungs from filling with air and passing oxygen into the blood.  Acute respiratory failure. This is a condition in which there is not enough oxygen passing from the lungs to the body or when carbon dioxide is not passing from the lungs out of the body.  Sepsis or septic shock. This is a serious bodily reaction to an infection.  Blood clotting problems.  Secondary infections due to bacteria or fungus.  Organ failure. This is when your body's organs stop working. The virus that causes COVID-19 is contagious. This means that it can spread from person to person through droplets from coughs and sneezes (respiratory secretions). What are the causes? This illness is caused by a virus. You may catch the virus by:  Breathing in droplets from an infected person. Droplets can be spread by a person breathing, speaking, singing, coughing, or sneezing.  Touching something, like a table or a doorknob, that was exposed to the virus (contaminated) and then touching your mouth, nose, or eyes. What increases the risk? Risk for infection You are more likely to be infected with this virus if you:  Are within 6 feet (2 meters) of a person with COVID-19.  Provide care for or live with a person who is infected with COVID-19.  Spend time in crowded indoor spaces or   live in shared housing. Risk for serious illness You are more likely to become seriously ill from the virus if you:  Are 50 years of age or older. The higher your age, the more you are at risk for serious illness.  Live in a nursing home or long-term care facility.  Have cancer.  Have a long-term (chronic) disease such as: ? Chronic lung disease,  including chronic obstructive pulmonary disease or asthma. ? A long-term disease that lowers your body's ability to fight infection (immunocompromised). ? Heart disease, including heart failure, a condition in which the arteries that lead to the heart become narrow or blocked (coronary artery disease), a disease which makes the heart muscle thick, weak, or stiff (cardiomyopathy). ? Diabetes. ? Chronic kidney disease. ? Sickle cell disease, a condition in which red blood cells have an abnormal "sickle" shape. ? Liver disease.  Are obese. What are the signs or symptoms? Symptoms of this condition can range from mild to severe. Symptoms may appear any time from 2 to 14 days after being exposed to the virus. They include:  A fever or chills.  A cough.  Difficulty breathing.  Headaches, body aches, or muscle aches.  Runny or stuffy (congested) nose.  A sore throat.  New loss of taste or smell. Some people may also have stomach problems, such as nausea, vomiting, or diarrhea. Other people may not have any symptoms of COVID-19. How is this diagnosed? This condition may be diagnosed based on:  Your signs and symptoms, especially if: ? You live in an area with a COVID-19 outbreak. ? You recently traveled to or from an area where the virus is common. ? You provide care for or live with a person who was diagnosed with COVID-19. ? You were exposed to a person who was diagnosed with COVID-19.  A physical exam.  Lab tests, which may include: ? Taking a sample of fluid from the back of your nose and throat (nasopharyngeal fluid), your nose, or your throat using a swab. ? A sample of mucus from your lungs (sputum). ? Blood tests.  Imaging tests, which may include, X-rays, CT scan, or ultrasound. How is this treated? At present, there is no medicine to treat COVID-19. Medicines that treat other diseases are being used on a trial basis to see if they are effective against COVID-19. Your  health care provider will talk with you about ways to treat your symptoms. For most people, the infection is mild and can be managed at home with rest, fluids, and over-the-counter medicines. Treatment for a serious infection usually takes places in a hospital intensive care unit (ICU). It may include one or more of the following treatments. These treatments are given until your symptoms improve.  Receiving fluids and medicines through an IV.  Supplemental oxygen. Extra oxygen is given through a tube in the nose, a face mask, or a hood.  Positioning you to lie on your stomach (prone position). This makes it easier for oxygen to get into the lungs.  Continuous positive airway pressure (CPAP) or bi-level positive airway pressure (BPAP) machine. This treatment uses mild air pressure to keep the airways open. A tube that is connected to a motor delivers oxygen to the body.  Ventilator. This treatment moves air into and out of the lungs by using a tube that is placed in your windpipe.  Tracheostomy. This is a procedure to create a hole in the neck so that a breathing tube can be inserted.  Extracorporeal membrane   oxygenation (ECMO). This procedure gives the lungs a chance to recover by taking over the functions of the heart and lungs. It supplies oxygen to the body and removes carbon dioxide. Follow these instructions at home: Lifestyle  If you are sick, stay home except to get medical care. Your health care provider will tell you how long to stay home. Call your health care provider before you go for medical care.  Rest at home as told by your health care provider.  Do not use any products that contain nicotine or tobacco, such as cigarettes, e-cigarettes, and chewing tobacco. If you need help quitting, ask your health care provider.  Return to your normal activities as told by your health care provider. Ask your health care provider what activities are safe for you. General  instructions  Take over-the-counter and prescription medicines only as told by your health care provider.  Drink enough fluid to keep your urine pale yellow.  Keep all follow-up visits as told by your health care provider. This is important. How is this prevented?  There is no vaccine to help prevent COVID-19 infection. However, there are steps you can take to protect yourself and others from this virus. To protect yourself:   Do not travel to areas where COVID-19 is a risk. The areas where COVID-19 is reported change often. To identify high-risk areas and travel restrictions, check the CDC travel website: wwwnc.cdc.gov/travel/notices  If you live in, or must travel to, an area where COVID-19 is a risk, take precautions to avoid infection. ? Stay away from people who are sick. ? Wash your hands often with soap and water for 20 seconds. If soap and water are not available, use an alcohol-based hand sanitizer. ? Avoid touching your mouth, face, eyes, or nose. ? Avoid going out in public, follow guidance from your state and local health authorities. ? If you must go out in public, wear a cloth face covering or face mask. Make sure your mask covers your nose and mouth. ? Avoid crowded indoor spaces. Stay at least 6 feet (2 meters) away from others. ? Disinfect objects and surfaces that are frequently touched every day. This may include:  Counters and tables.  Doorknobs and light switches.  Sinks and faucets.  Electronics, such as phones, remote controls, keyboards, computers, and tablets. To protect others: If you have symptoms of COVID-19, take steps to prevent the virus from spreading to others.  If you think you have a COVID-19 infection, contact your health care provider right away. Tell your health care team that you think you may have a COVID-19 infection.  Stay home. Leave your house only to seek medical care. Do not use public transport.  Do not travel while you are  sick.  Wash your hands often with soap and water for 20 seconds. If soap and water are not available, use alcohol-based hand sanitizer.  Stay away from other members of your household. Let healthy household members care for children and pets, if possible. If you have to care for children or pets, wash your hands often and wear a mask. If possible, stay in your own room, separate from others. Use a different bathroom.  Make sure that all people in your household wash their hands well and often.  Cough or sneeze into a tissue or your sleeve or elbow. Do not cough or sneeze into your hand or into the air.  Wear a cloth face covering or face mask. Make sure your mask covers your nose   and mouth. Where to find more information  Centers for Disease Control and Prevention: PurpleGadgets.be  World Health Organization: https://www.castaneda.info/ Contact a health care provider if:  You live in or have traveled to an area where COVID-19 is a risk and you have symptoms of the infection.  You have had contact with someone who has COVID-19 and you have symptoms of the infection. Get help right away if:  You have trouble breathing.  You have pain or pressure in your chest.  You have confusion.  You have bluish lips and fingernails.  You have difficulty waking from sleep.  You have symptoms that get worse. These symptoms may represent a serious problem that is an emergency. Do not wait to see if the symptoms will go away. Get medical help right away. Call your local emergency services (911 in the [place] Do not drive yourself to the hospital. Let the emergency medical personnel know if you think you have COVID-19. Summary  COVID-19 is a respiratory infection that is caused by a virus. It is also known as coronavirus disease or novel coronavirus. It can cause serious infections, such as pneumonia, acute respiratory distress syndrome, acute respiratory failure,  or sepsis.  The virus that causes COVID-19 is contagious. This means that it can spread from person to person through droplets from breathing, speaking, singing, coughing, or sneezing.  You are more likely to develop a serious illness if you are 61 years of age or older, have a weak immune system, live in a nursing home, or have chronic disease.  There is no medicine to treat COVID-19. Your health care provider will talk with you about ways to treat your symptoms.  Take steps to protect yourself and others from infection. Wash your hands often and disinfect objects and surfaces that are frequently touched every day. Stay away from people who are sick and wear a mask if you are sick. This information is not intended to replace advice given to you by your health care provider. Make sure you discuss any questions you have with your health care provider. Document Revised: 03/27/2019 Document Reviewed: 07/03/2018 Elsevier Patient Education  2020 [employer].   COVID-19 Frequently Asked Questions COVID-19 (coronavirus disease) is an infection that is caused by a large family of viruses. Some viruses cause illness in people and others cause illness in animals like camels, cats, and bats. In some cases, the viruses that cause illness in animals can spread to humans. Where did the coronavirus come from? In December 2019, [place] told the [facility]) of several cases of lung disease (human respiratory illness). These cases were linked to an open seafood and livestock market in the city of [place]. The link to the seafood and livestock market suggests that the virus may have spread from animals to humans. However, since that first outbreak in December, the virus has also been shown to spread from person to person. What is the name of the disease and the virus? Disease name Early on, this disease was called novel coronavirus. This is because scientists determined that the disease was caused  by a new (novel) respiratory virus. The World Health Organization [facility]) has now named the disease COVID-19, or coronavirus disease. Virus name The virus that causes the disease is called severe acute respiratory syndrome coronavirus 2 (SARS-CoV-2). More information on disease and virus naming World Health Organization [facility]): www.who.int/emergencies/diseases/novel-coronavirus-2019/technical-guidance/naming-the-coronavirus-disease-(covid-2019)-and-the-virus-that-causes-it Who is at risk for complications from coronavirus disease? Some people may be at higher risk for complications from coronavirus  disease. This includes older adults and people who have chronic diseases, such as heart disease, diabetes, and lung disease. If you are at higher risk for complications, take these extra precautions:  Stay home as much as possible.  Avoid social gatherings and travel.  Avoid close contact with others. Stay at least 6 ft (2 m) away from others, if possible.  Wash your hands often with soap and water for at least 20 seconds.  Avoid touching your face, mouth, nose, or eyes.  Keep supplies on hand at home, such as food, medicine, and cleaning supplies.  If you must go out in public, wear a cloth face covering or face mask. Make sure your mask covers your nose and mouth. How does coronavirus disease spread? The virus that causes coronavirus disease spreads easily from person to person (is contagious). You may catch the virus by:  Breathing in droplets from an infected person. Droplets can be spread by a person breathing, speaking, singing, coughing, or sneezing.  Touching something, like a table or a doorknob, that was exposed to the virus (contaminated) and then touching your mouth, nose, or eyes. Can I get the virus from touching surfaces or objects? There is still a lot that we do not know about the virus that causes coronavirus disease. Scientists are basing a lot of information on what they know  about similar viruses, such as:  Viruses cannot generally survive on surfaces for long. They need a human body (host) to survive.  It is more likely that the virus is spread by close contact with people who are sick (direct contact), such as through: ? Shaking hands or hugging. ? Breathing in respiratory droplets that travel through the air. Droplets can be spread by a person breathing, speaking, singing, coughing, or sneezing.  It is less likely that the virus is spread when a person touches a surface or object that has the virus on it (indirect contact). The virus may be able to enter the body if the person touches a surface or object and then touches his or her face, eyes, nose, or mouth. Can a person spread the virus without having symptoms of the disease? It may be possible for the virus to spread before a person has symptoms of the disease, but this is most likely not the main way the virus is spreading. It is more likely for the virus to spread by being in close contact with people who are sick and breathing in the respiratory droplets spread by a person breathing, speaking, singing, coughing, or sneezing. What are the symptoms of coronavirus disease? Symptoms vary from person to person and can range from mild to severe. Symptoms may include:  Fever or chills.  Cough.  Difficulty breathing or feeling short of breath.  Headaches, body aches, or muscle aches.  Runny or stuffy (congested) nose.  Sore throat.  New loss of taste or smell.  Nausea, vomiting, or diarrhea. These symptoms can appear anywhere from 2 to 14 days after you have been exposed to the virus. Some people may not have any symptoms. If you develop symptoms, call your health care provider. People with severe symptoms may need hospital care. Should I be tested for this virus? Your health care provider will decide whether to test you based on your symptoms, history of exposure, and your risk factors. How does a  health care provider test for this virus? Health care providers will collect samples to send for testing. Samples may include:  Taking a swab  of fluid from the back of your nose and throat, your nose, or your throat.  Taking fluid from the lungs by having you cough up mucus (sputum) into a sterile cup.  Taking a blood sample. Is there a treatment or vaccine for this virus? Currently, there is no vaccine to prevent coronavirus disease. Also, there are no medicines like antibiotics or antivirals to treat the virus. A person who becomes sick is given supportive care, which means rest and fluids. A person may also relieve his or her symptoms by using over-the-counter medicines that treat sneezing, coughing, and runny nose. These are the same medicines that a person takes for the common cold. If you develop symptoms, call your health care provider. People with severe symptoms may need hospital care. What can I do to protect myself and my family from this virus?     You can protect yourself and your family by taking the same actions that you would take to prevent the spread of other viruses. Take the following actions:  Wash your hands often with soap and water for at least 20 seconds. If soap and water are not available, use alcohol-based hand sanitizer.  Avoid touching your face, mouth, nose, or eyes.  Cough or sneeze into a tissue, sleeve, or elbow. Do not cough or sneeze into your hand or the air. ? If you cough or sneeze into a tissue, throw it away immediately and wash your hands.  Disinfect objects and surfaces that you frequently touch every day.  Stay away from people who are sick.  Avoid going out in public, follow guidance from your state and local health authorities.  Avoid crowded indoor spaces. Stay at least 6 ft (2 m) away from others.  If you must go out in public, wear a cloth face covering or face mask. Make sure your mask covers your nose and mouth.  Stay home if you  are sick, except to get medical care. Call your health care provider before you get medical care. Your health care provider will tell you how long to stay home.  Make sure your vaccines are up to date. Ask your health care provider what vaccines you need. What should I do if I need to travel? Follow travel recommendations from your local health authority, the CDC, and WHO. Travel information and advice  Centers for Disease Control and Prevention (CDC): BodyEditor.hu  World Health Organization [facility]): ThirdIncome.ca Know the risks and take action to protect your health  You are at higher risk of getting coronavirus disease if you are traveling to areas with an outbreak or if you are exposed to travelers from areas with an outbreak.  Wash your hands often and practice good hygiene to lower the risk of catching or spreading the virus. What should I do if I am sick? General instructions to stop the spread of infection  Wash your hands often with soap and water for at least 20 seconds. If soap and water are not available, use alcohol-based hand sanitizer.  Cough or sneeze into a tissue, sleeve, or elbow. Do not cough or sneeze into your hand or the air.  If you cough or sneeze into a tissue, throw it away immediately and wash your hands.  Stay home unless you must get medical care. Call your health care provider or local health authority before you get medical care.  Avoid public areas. Do not take public transportation, if possible.  If you can, wear a mask if you must go out  of the house or if you are in close contact with someone who is not sick. Make sure your mask covers your nose and mouth. Keep your home clean  Disinfect objects and surfaces that are frequently touched every day. This may include: ? Counters and tables. ? Doorknobs and light switches. ? Sinks and faucets. ? Electronics  such as phones, remote controls, keyboards, computers, and tablets.  Wash dishes in hot, soapy water or use a dishwasher. Air-dry your dishes.  Wash laundry in hot water. Prevent infecting other household members  Let healthy household members care for children and pets, if possible. If you have to care for children or pets, wash your hands often and wear a mask.  Sleep in a different bedroom or bed, if possible.  Do not share personal items, such as razors, toothbrushes, deodorant, combs, brushes, towels, and washcloths. Where to find more information Centers for Disease Control and Prevention (CDC)  Information and news updates: https://www.butler-gonzalez.com/ World Health Organization [facility])  Information and news updates: MissExecutive.com.ee  Coronavirus health topic: https://www.castaneda.info/  Questions and answers on COVID-19: OpportunityDebt.at  Global tracker: who.sprinklr.com American Academy of Pediatrics (AAP)  Information for families: www.healthychildren.org/English/health-issues/conditions/chest-lungs/Pages/2019-Novel-Coronavirus.aspx The coronavirus situation is changing rapidly. Check your local health authority website or the CDC and [facility] websites for updates and news. When should I contact a health care provider?  Contact your health care provider if you have symptoms of an infection, such as fever or cough, and you: ? Have been near anyone who is known to have coronavirus disease. ? Have come into contact with a person who is suspected to have coronavirus disease. ? Have traveled to an area where there is an outbreak of COVID-19. When should I get emergency medical care?  Get help right away by calling your local emergency services (911 in the [place] if you have: ? Trouble breathing. ? Pain or pressure in your chest. ? Confusion. ? Blue-tinged lips and fingernails. ? Difficulty  waking from sleep. ? Symptoms that get worse. Let the emergency medical personnel know if you think you have coronavirus disease. Summary  A new respiratory virus is spreading from person to person and causing COVID-19 (coronavirus disease).  The virus that causes COVID-19 appears to spread easily. It spreads from one person to another through droplets from breathing, speaking, singing, coughing, or sneezing.  Older adults and those with chronic diseases are at higher risk of disease. If you are at higher risk for complications, take extra precautions.  There is currently no vaccine to prevent coronavirus disease. There are no medicines, such as antibiotics or antivirals, to treat the virus.  You can protect yourself and your family by washing your hands often, avoiding touching your face, and covering your coughs and sneezes. This information is not intended to replace advice given to you by your health care provider. Make sure you discuss any questions you have with your health care provider. Document Revised: 03/27/2019 Document Reviewed: 09/23/2018 Elsevier Patient Education  [place]: Quarantine vs. Isolation QUARANTINE keeps someone who was in close contact with someone who has COVID-19 away from others. If you had close contact with a person who has COVID-19  Stay home until 14 days after your last contact.  Check your temperature twice a day and watch for symptoms of COVID-19.  If possible, stay away from people who are at higher-risk for getting very sick from COVID-19. ISOLATION keeps someone who is sick or tested positive for COVID-19 without symptoms away  from others, even in their own home. If you are sick and think or know you have COVID-19  Stay home until after ? At least 10 days since symptoms first appeared and ? At least 24 hours with no fever without fever-reducing medication and ? Symptoms have improved If you tested positive for COVID-19  but do not have symptoms  Stay home until after ? 10 days have passed since your positive test If you live with others, stay in a specific "sick room" or area and away from other people or animals, including pets. Use a separate bathroom, if available. [email or web address] 12/29/2018 This information is not intended to replace advice given to you by your health care provider. Make sure you discuss any questions you have with your health care provider. Document Revised: 05/14/2019 Document Reviewed: 05/14/2019 Elsevier Patient Education  2020 Elsevier Inc.    IMPORTANT INFORMATION: PAY CLOSE ATTENTION   PHYSICIAN DISCHARGE INSTRUCTIONS  Follow with Primary care provider  Babs Sciara, MD  and other consultants as instructed by your Hospitalist Physician  SEEK MEDICAL CARE OR RETURN TO EMERGENCY ROOM IF SYMPTOMS COME BACK, WORSEN OR NEW PROBLEM DEVELOPS   Please note: You were cared for by a hospitalist during your hospital stay. Every effort will be made to forward records to your primary care provider.  You can request that your primary care provider send for your hospital records if they have not received them.  Once you are discharged, your primary care physician will handle any further medical issues. Please note that NO REFILLS for any discharge medications will be authorized once you are discharged, as it is imperative that you return to your primary care physician (or establish a relationship with a primary care physician if you do not have one) for your post hospital discharge needs so that they can reassess your need for medications and monitor your lab values.  Please get a complete blood count and chemistry panel checked by your Primary MD at your next visit, and again as instructed by your Primary MD.  Get Medicines reviewed and adjusted: Please take all your medications with you for your next visit with your Primary MD  Laboratory/radiological data: Please request your  Primary MD to go over all hospital tests and procedure/radiological results at the follow up, please ask your primary care provider to get all Hospital records sent to his/her office.  In some cases, they will be blood work, cultures and biopsy results pending at the time of your discharge. Please request that your primary care provider follow up on these results.  If you are diabetic, please bring your blood sugar readings with you to your follow up appointment with primary care.    Please call and make your follow up appointments as soon as possible.    Also Note the following: If you experience worsening of your admission symptoms, develop shortness of breath, life threatening emergency, suicidal or homicidal thoughts you must seek medical attention immediately by calling 911 or calling your MD immediately  if symptoms less severe.  You must read complete instructions/literature along with all the possible adverse reactions/side effects for all the Medicines you take and that have been prescribed to you. Take any new Medicines after you have completely understood and accpet all the possible adverse reactions/side effects.   Do not drive when taking Pain medications or sleeping medications (Benzodiazepines)  Do not take more than prescribed Pain, Sleep and Anxiety Medications. It is not advisable to combine anxiety,sleep and  pain medications without talking with your primary care practitioner  Special Instructions: If you have smoked or chewed Tobacco  in the last 2 yrs please stop smoking, stop any regular Alcohol  and or any Recreational drug use.  Wear Seat belts while driving.  Do not drive if taking any narcotic, mind altering or controlled substances or recreational drugs or alcohol.

## 2020-02-15 NOTE — Progress Notes (Signed)
Discharge instructions reviewed with patient. Husband en route with portable oxygen. IVA discontinued.

## 2020-02-16 ENCOUNTER — Telehealth: Payer: Self-pay | Admitting: Family Medicine

## 2020-02-16 NOTE — Telephone Encounter (Signed)
Pt states no one told her she needed to be on it just was wondering since she was getting them in the hospital. I told her they give them while in hospital to prevent blood clots and that I would send you the message.

## 2020-02-16 NOTE — Telephone Encounter (Signed)
Pt was released from hospital yesterday form Covid pt was put on Lovenox injection for blood clots. Hospital ask pt to check with Dr to see if he wants to keep her on this RX if so pt need meds call into [facility]   Call back [phone]

## 2020-02-16 NOTE — Telephone Encounter (Signed)
Discussed with pt and she verbalized understanding.  

## 2020-02-16 NOTE — Telephone Encounter (Signed)
Nurses please talk with the patient.   I reviewed over her chart.  Her CT scan of the lungs did not show any blood clots.  Her ultrasound of her legs did not show any blood clots. Her discharge summary does not state for her to be on Lovenox ongoing.  Please find out from the patient what she understood regarding this issue

## 2020-02-16 NOTE — Telephone Encounter (Signed)
I agree that this is pretty much just a hospital protocol and not something that they send people home on unless they have a DVT

## 2020-02-17 ENCOUNTER — Telehealth: Payer: Self-pay | Admitting: Family Medicine

## 2020-02-17 DIAGNOSIS — J9601 Acute respiratory failure with hypoxia: Secondary | ICD-10-CM | POA: Diagnosis not present

## 2020-02-17 DIAGNOSIS — A084 Viral intestinal infection, unspecified: Secondary | ICD-10-CM | POA: Diagnosis not present

## 2020-02-17 DIAGNOSIS — R112 Nausea with vomiting, unspecified: Secondary | ICD-10-CM | POA: Diagnosis not present

## 2020-02-17 DIAGNOSIS — G894 Chronic pain syndrome: Secondary | ICD-10-CM | POA: Diagnosis not present

## 2020-02-17 DIAGNOSIS — I4510 Unspecified right bundle-branch block: Secondary | ICD-10-CM | POA: Diagnosis not present

## 2020-02-17 DIAGNOSIS — J1282 Pneumonia due to coronavirus disease 2019: Secondary | ICD-10-CM | POA: Diagnosis not present

## 2020-02-17 DIAGNOSIS — G35 Multiple sclerosis: Secondary | ICD-10-CM | POA: Diagnosis not present

## 2020-02-17 DIAGNOSIS — I10 Essential (primary) hypertension: Secondary | ICD-10-CM | POA: Diagnosis not present

## 2020-02-17 DIAGNOSIS — U071 COVID-19: Secondary | ICD-10-CM | POA: Diagnosis not present

## 2020-02-17 DIAGNOSIS — G500 Trigeminal neuralgia: Secondary | ICD-10-CM | POA: Diagnosis not present

## 2020-02-17 DIAGNOSIS — G510 Bell's palsy: Secondary | ICD-10-CM | POA: Diagnosis not present

## 2020-02-17 NOTE — Telephone Encounter (Signed)
Left message to return call 

## 2020-02-17 NOTE — Telephone Encounter (Signed)
Please advise. Thank you

## 2020-02-17 NOTE — Telephone Encounter (Signed)
Please go ahead with this 

## 2020-02-17 NOTE — Telephone Encounter (Signed)
[facility] -[facility]-called needing verbal orders for skilled nursing for one week 4. Patient is on oxygen got out of [facility] on 9/6. [phone]

## 2020-02-18 DIAGNOSIS — R112 Nausea with vomiting, unspecified: Secondary | ICD-10-CM | POA: Diagnosis not present

## 2020-02-18 DIAGNOSIS — J9601 Acute respiratory failure with hypoxia: Secondary | ICD-10-CM | POA: Diagnosis not present

## 2020-02-18 DIAGNOSIS — A084 Viral intestinal infection, unspecified: Secondary | ICD-10-CM | POA: Diagnosis not present

## 2020-02-18 DIAGNOSIS — G500 Trigeminal neuralgia: Secondary | ICD-10-CM | POA: Diagnosis not present

## 2020-02-18 DIAGNOSIS — J1282 Pneumonia due to coronavirus disease 2019: Secondary | ICD-10-CM | POA: Diagnosis not present

## 2020-02-18 DIAGNOSIS — G35 Multiple sclerosis: Secondary | ICD-10-CM | POA: Diagnosis not present

## 2020-02-18 DIAGNOSIS — G894 Chronic pain syndrome: Secondary | ICD-10-CM | POA: Diagnosis not present

## 2020-02-18 DIAGNOSIS — I4510 Unspecified right bundle-branch block: Secondary | ICD-10-CM | POA: Diagnosis not present

## 2020-02-18 DIAGNOSIS — I10 Essential (primary) hypertension: Secondary | ICD-10-CM | POA: Diagnosis not present

## 2020-02-18 DIAGNOSIS — G510 Bell's palsy: Secondary | ICD-10-CM | POA: Diagnosis not present

## 2020-02-18 DIAGNOSIS — U071 COVID-19: Secondary | ICD-10-CM | POA: Diagnosis not present

## 2020-02-18 NOTE — Telephone Encounter (Signed)
Verbal orders given to [facility]annon at [facility]

## 2020-02-24 ENCOUNTER — Telehealth: Payer: Self-pay | Admitting: *Deleted

## 2020-02-24 ENCOUNTER — Other Ambulatory Visit: Payer: Self-pay | Admitting: *Deleted

## 2020-02-24 DIAGNOSIS — U071 COVID-19: Secondary | ICD-10-CM | POA: Diagnosis not present

## 2020-02-24 DIAGNOSIS — J1282 Pneumonia due to coronavirus disease 2019: Secondary | ICD-10-CM | POA: Diagnosis not present

## 2020-02-24 DIAGNOSIS — G500 Trigeminal neuralgia: Secondary | ICD-10-CM | POA: Diagnosis not present

## 2020-02-24 DIAGNOSIS — J9601 Acute respiratory failure with hypoxia: Secondary | ICD-10-CM | POA: Diagnosis not present

## 2020-02-24 DIAGNOSIS — G510 Bell's palsy: Secondary | ICD-10-CM | POA: Diagnosis not present

## 2020-02-24 DIAGNOSIS — G894 Chronic pain syndrome: Secondary | ICD-10-CM | POA: Diagnosis not present

## 2020-02-24 DIAGNOSIS — G35 Multiple sclerosis: Secondary | ICD-10-CM | POA: Diagnosis not present

## 2020-02-24 DIAGNOSIS — I4510 Unspecified right bundle-branch block: Secondary | ICD-10-CM | POA: Diagnosis not present

## 2020-02-24 DIAGNOSIS — I10 Essential (primary) hypertension: Secondary | ICD-10-CM | POA: Diagnosis not present

## 2020-02-24 DIAGNOSIS — R112 Nausea with vomiting, unspecified: Secondary | ICD-10-CM | POA: Diagnosis not present

## 2020-02-24 DIAGNOSIS — A084 Viral intestinal infection, unspecified: Secondary | ICD-10-CM | POA: Diagnosis not present

## 2020-02-24 MED ORDER — SUCRALFATE 1 G PO TABS: ORAL_TABLET | ORAL | 0 refills | Status: DC

## 2020-02-24 NOTE — Telephone Encounter (Signed)
Melinda Moore [phone] home health nurse calling to report when out seeing pt for post covid her heart rate went to 124 when standing. It usually is between 104 -108.  They have to report heart rate over 120 she states.   I called pt to see how she was doing. She states she is a little tired right now because she just finished PT. States some sob but getting better. On 3 L of O2. States she is not having chest pain.   States she is taking pantoprazole and wants to know if she can take something else along with it. She states she takes it in the morning and when lying down at night she has burning in throat.   [place] [place] [facility].

## 2020-02-24 NOTE — Telephone Encounter (Signed)
Discussed with pt and med sent to pharm.  

## 2020-02-24 NOTE — Telephone Encounter (Signed)
May do Carafate 3 times daily for the next 30 days, #90 continue other medicine

## 2020-02-25 DIAGNOSIS — J9601 Acute respiratory failure with hypoxia: Secondary | ICD-10-CM | POA: Diagnosis not present

## 2020-02-25 DIAGNOSIS — G35 Multiple sclerosis: Secondary | ICD-10-CM | POA: Diagnosis not present

## 2020-02-25 DIAGNOSIS — R112 Nausea with vomiting, unspecified: Secondary | ICD-10-CM | POA: Diagnosis not present

## 2020-02-25 DIAGNOSIS — J1282 Pneumonia due to coronavirus disease 2019: Secondary | ICD-10-CM | POA: Diagnosis not present

## 2020-02-25 DIAGNOSIS — G894 Chronic pain syndrome: Secondary | ICD-10-CM | POA: Diagnosis not present

## 2020-02-25 DIAGNOSIS — U071 COVID-19: Secondary | ICD-10-CM | POA: Diagnosis not present

## 2020-02-25 DIAGNOSIS — A084 Viral intestinal infection, unspecified: Secondary | ICD-10-CM | POA: Diagnosis not present

## 2020-02-25 DIAGNOSIS — I10 Essential (primary) hypertension: Secondary | ICD-10-CM | POA: Diagnosis not present

## 2020-02-25 DIAGNOSIS — I4510 Unspecified right bundle-branch block: Secondary | ICD-10-CM | POA: Diagnosis not present

## 2020-02-25 DIAGNOSIS — G500 Trigeminal neuralgia: Secondary | ICD-10-CM | POA: Diagnosis not present

## 2020-02-25 DIAGNOSIS — G510 Bell's palsy: Secondary | ICD-10-CM | POA: Diagnosis not present

## 2020-02-26 DIAGNOSIS — J1282 Pneumonia due to coronavirus disease 2019: Secondary | ICD-10-CM | POA: Diagnosis not present

## 2020-02-26 DIAGNOSIS — G894 Chronic pain syndrome: Secondary | ICD-10-CM | POA: Diagnosis not present

## 2020-02-26 DIAGNOSIS — U071 COVID-19: Secondary | ICD-10-CM | POA: Diagnosis not present

## 2020-02-26 DIAGNOSIS — R112 Nausea with vomiting, unspecified: Secondary | ICD-10-CM | POA: Diagnosis not present

## 2020-02-26 DIAGNOSIS — G500 Trigeminal neuralgia: Secondary | ICD-10-CM | POA: Diagnosis not present

## 2020-02-26 DIAGNOSIS — A084 Viral intestinal infection, unspecified: Secondary | ICD-10-CM | POA: Diagnosis not present

## 2020-02-26 DIAGNOSIS — I4510 Unspecified right bundle-branch block: Secondary | ICD-10-CM | POA: Diagnosis not present

## 2020-02-26 DIAGNOSIS — I10 Essential (primary) hypertension: Secondary | ICD-10-CM | POA: Diagnosis not present

## 2020-02-26 DIAGNOSIS — G35 Multiple sclerosis: Secondary | ICD-10-CM | POA: Diagnosis not present

## 2020-02-26 DIAGNOSIS — J9601 Acute respiratory failure with hypoxia: Secondary | ICD-10-CM | POA: Diagnosis not present

## 2020-02-26 DIAGNOSIS — G510 Bell's palsy: Secondary | ICD-10-CM | POA: Diagnosis not present

## 2020-02-29 DIAGNOSIS — A084 Viral intestinal infection, unspecified: Secondary | ICD-10-CM | POA: Diagnosis not present

## 2020-02-29 DIAGNOSIS — R112 Nausea with vomiting, unspecified: Secondary | ICD-10-CM | POA: Diagnosis not present

## 2020-02-29 DIAGNOSIS — U071 COVID-19: Secondary | ICD-10-CM | POA: Diagnosis not present

## 2020-02-29 DIAGNOSIS — G510 Bell's palsy: Secondary | ICD-10-CM | POA: Diagnosis not present

## 2020-02-29 DIAGNOSIS — J9601 Acute respiratory failure with hypoxia: Secondary | ICD-10-CM | POA: Diagnosis not present

## 2020-02-29 DIAGNOSIS — I4510 Unspecified right bundle-branch block: Secondary | ICD-10-CM | POA: Diagnosis not present

## 2020-02-29 DIAGNOSIS — G35 Multiple sclerosis: Secondary | ICD-10-CM | POA: Diagnosis not present

## 2020-02-29 DIAGNOSIS — I10 Essential (primary) hypertension: Secondary | ICD-10-CM | POA: Diagnosis not present

## 2020-02-29 DIAGNOSIS — G500 Trigeminal neuralgia: Secondary | ICD-10-CM | POA: Diagnosis not present

## 2020-02-29 DIAGNOSIS — G894 Chronic pain syndrome: Secondary | ICD-10-CM | POA: Diagnosis not present

## 2020-02-29 DIAGNOSIS — J1282 Pneumonia due to coronavirus disease 2019: Secondary | ICD-10-CM | POA: Diagnosis not present

## 2020-03-01 ENCOUNTER — Telehealth (INDEPENDENT_AMBULATORY_CARE_PROVIDER_SITE_OTHER): Payer: 59 | Admitting: Family Medicine

## 2020-03-01 ENCOUNTER — Telehealth: Payer: Self-pay | Admitting: *Deleted

## 2020-03-01 ENCOUNTER — Other Ambulatory Visit: Payer: Self-pay

## 2020-03-01 DIAGNOSIS — R0902 Hypoxemia: Secondary | ICD-10-CM | POA: Diagnosis not present

## 2020-03-01 DIAGNOSIS — U071 COVID-19: Secondary | ICD-10-CM

## 2020-03-01 MED ORDER — ONDANSETRON 8 MG PO TBDP: 8.0000 mg | ORAL_TABLET | Freq: Three times a day (TID) | ORAL | 4 refills | Status: DC | PRN

## 2020-03-01 MED ORDER — AZITHROMYCIN 250 MG PO TABS: ORAL_TABLET | ORAL | 0 refills | Status: DC

## 2020-03-01 MED ORDER — ALBUTEROL SULFATE HFA 108 (90 BASE) MCG/ACT IN AERS: 2.0000 | INHALATION_SPRAY | RESPIRATORY_TRACT | 1 refills | Status: DC | PRN

## 2020-03-01 MED ORDER — PREDNISONE 20 MG PO TABS: ORAL_TABLET | ORAL | 0 refills | Status: DC

## 2020-03-01 NOTE — Telephone Encounter (Signed)
[name], Melinda Moore are scheduled for a virtual visit with your provider today.    Just as we do with appointments in the office, we must obtain your consent to participate.  Your consent will be active for this visit and any virtual visit you may have with one of our providers in the next 365 days.    If you have a MyChart account, I can also send a copy of this consent to you electronically.  All virtual visits are billed to your insurance company just like a traditional visit in the office.  As this is a virtual visit, video technology does not allow for your provider to perform a traditional examination.  This may limit your provider's ability to fully assess your condition.  If your provider identifies any concerns that need to be evaluated in person or the need to arrange testing such as labs, EKG, etc, we will make arrangements to do so.    Although advances in technology are sophisticated, we cannot ensure that it will always work on either your end or our end.  If the connection with a video visit is poor, we may have to switch to a telephone visit.  With either a video or telephone visit, we are not always able to ensure that we have a secure connection.   I need to obtain your verbal consent now.   Are you willing to proceed with your visit today?   Melinda Moore has provided verbal consent on 03/01/2020 for a virtual visit (video or telephone).   Kathleen Lime, RN 03/01/2020  8:49 AM

## 2020-03-01 NOTE — Addendum Note (Signed)
Addended by: Margaretha Sheffield on: 03/01/2020 01:08 PM   Modules accepted: Orders

## 2020-03-01 NOTE — Progress Notes (Signed)
9/21/21[name] at [facility] states that pt has 2 scripts on file for Hydromorphone 4 mg #130. Please advise. Thank you

## 2020-03-01 NOTE — Progress Notes (Signed)
   Subjective:    Patient ID: Melinda Moore, female    DOB: 19-Jun-1970, 49 y.o.   MRN: [id number]  HPI Patient in hospital several weeks Still on oxygen Getting over Covid Had abnormal CAT scan  Patient arrives for a follow up from a recent hospitalization for Covid. Patient states she came home on 3 liters of oxygen. Patient states she is doing better and adjusting to oxygen at home. Patient wants to know if she needs to see pulmonology to handle her oxygen and tell her when or if she can stop it. Virtual Visit via Video Note  I connected with Melinda Moore on 03/01/20 at  9:30 AM EDT by a video enabled telemedicine application and verified that I am speaking with the correct person using two identifiers.  Location: Patient: home  Provider: office   I discussed the limitations of evaluation and management by telemedicine and the availability of in person appointments. The patient expressed understanding and agreed to proceed.  History of Present Illness:    Observations/Objective:   Assessment and Plan:   Follow Up Instructions:    I discussed the assessment and treatment plan with the patient. The patient was provided an opportunity to ask questions and all were answered. The patient agreed with the plan and demonstrated an understanding of the instructions.   The patient was advised to call back or seek an in-person evaluation if the symptoms worsen or if the condition fails to improve as anticipated.  I provided 20 minutes of non-face-to-face time during this encounter.     Review of Systems  Constitutional: Negative for activity change and fever.  HENT: Negative for congestion, ear pain and rhinorrhea.   Eyes: Negative for discharge.  Respiratory: Negative for cough, shortness of breath and wheezing.   Cardiovascular: Negative for chest pain.       Objective:   Physical Exam  Today's visit was via telephone Physical exam was not possible for this visit         Assessment & Plan:  Recent Covid pneumonia Hypoxia Using 3 L O2 per nasal cannula Continue current measures Patient should gradually get better over the next several weeks She was told though that some people can suffer lifelong damage from Covid pneumonia May or may not get to the point of being off oxygen Patient will do a standard follow-up for her pain medicine in 3 months As for her pain medicine she is currently utilizing 3 or less per day which I think is the best approach.  We will go ahead and do pulmonary referral because of her damage to her lungs from Covid pneumonia and being on oxygen

## 2020-03-02 ENCOUNTER — Other Ambulatory Visit: Payer: Self-pay | Admitting: Family Medicine

## 2020-03-02 NOTE — Telephone Encounter (Signed)
Pt may have 2 rx for dilaudid 4 mg Directions One tid prn #90 Dates 03/03/20 and 04/02/20  Please put on notes to pharmacist new dose discard previous pain rx Please pend

## 2020-03-02 NOTE — Addendum Note (Signed)
Addended by: Haze Rushing on: 03/02/2020 04:52 PM   Modules accepted: Orders

## 2020-03-03 ENCOUNTER — Telehealth: Payer: Self-pay | Admitting: *Deleted

## 2020-03-03 DIAGNOSIS — U071 COVID-19: Secondary | ICD-10-CM | POA: Diagnosis not present

## 2020-03-03 DIAGNOSIS — I10 Essential (primary) hypertension: Secondary | ICD-10-CM | POA: Diagnosis not present

## 2020-03-03 DIAGNOSIS — J9601 Acute respiratory failure with hypoxia: Secondary | ICD-10-CM | POA: Diagnosis not present

## 2020-03-03 DIAGNOSIS — R112 Nausea with vomiting, unspecified: Secondary | ICD-10-CM | POA: Diagnosis not present

## 2020-03-03 DIAGNOSIS — G500 Trigeminal neuralgia: Secondary | ICD-10-CM | POA: Diagnosis not present

## 2020-03-03 DIAGNOSIS — G510 Bell's palsy: Secondary | ICD-10-CM | POA: Diagnosis not present

## 2020-03-03 DIAGNOSIS — J1282 Pneumonia due to coronavirus disease 2019: Secondary | ICD-10-CM | POA: Diagnosis not present

## 2020-03-03 DIAGNOSIS — I4510 Unspecified right bundle-branch block: Secondary | ICD-10-CM | POA: Diagnosis not present

## 2020-03-03 DIAGNOSIS — G35 Multiple sclerosis: Secondary | ICD-10-CM | POA: Diagnosis not present

## 2020-03-03 DIAGNOSIS — A084 Viral intestinal infection, unspecified: Secondary | ICD-10-CM | POA: Diagnosis not present

## 2020-03-03 DIAGNOSIS — G894 Chronic pain syndrome: Secondary | ICD-10-CM | POA: Diagnosis not present

## 2020-03-03 MED ORDER — HYDROMORPHONE HCL 4 MG PO TABS
ORAL_TABLET | ORAL | 0 refills | Status: DC
Start: 2020-03-03 — End: 2020-04-07

## 2020-03-03 MED ORDER — HYDROMORPHONE HCL 4 MG PO TABS: ORAL_TABLET | ORAL | 0 refills | Status: DC

## 2020-03-03 MED ORDER — ALBUTEROL SULFATE HFA 108 (90 BASE) MCG/ACT IN AERS: 2.0000 | INHALATION_SPRAY | RESPIRATORY_TRACT | 1 refills | Status: DC | PRN

## 2020-03-03 NOTE — Telephone Encounter (Signed)
HH nurse Shanda Bumps called while at pt house today -- right leg is cramping and uncomfortable, left leg has some swelling. Patient has started back taking her fluid pill since being discharged from the hospital on  9/6. D-dimer elevated before discharge and potassium slightly low. Nurse calling to see if Dr wanted to recheck any blood work. Per Dr. Ladona Ridgel she will check bmp.

## 2020-03-03 NOTE — Telephone Encounter (Signed)
Yes in agreement with recheck bmp and will call with results. Cont to monitor leg swelling/edema. Thx. Dr. Ladona Ridgel

## 2020-03-04 ENCOUNTER — Telehealth: Payer: Self-pay | Admitting: Family Medicine

## 2020-03-04 DIAGNOSIS — A084 Viral intestinal infection, unspecified: Secondary | ICD-10-CM | POA: Diagnosis not present

## 2020-03-04 DIAGNOSIS — I4510 Unspecified right bundle-branch block: Secondary | ICD-10-CM | POA: Diagnosis not present

## 2020-03-04 DIAGNOSIS — G510 Bell's palsy: Secondary | ICD-10-CM | POA: Diagnosis not present

## 2020-03-04 DIAGNOSIS — G35 Multiple sclerosis: Secondary | ICD-10-CM | POA: Diagnosis not present

## 2020-03-04 DIAGNOSIS — G894 Chronic pain syndrome: Secondary | ICD-10-CM | POA: Diagnosis not present

## 2020-03-04 DIAGNOSIS — G500 Trigeminal neuralgia: Secondary | ICD-10-CM | POA: Diagnosis not present

## 2020-03-04 DIAGNOSIS — R112 Nausea with vomiting, unspecified: Secondary | ICD-10-CM | POA: Diagnosis not present

## 2020-03-04 DIAGNOSIS — E878 Other disorders of electrolyte and fluid balance, not elsewhere classified: Secondary | ICD-10-CM | POA: Diagnosis not present

## 2020-03-04 DIAGNOSIS — J9601 Acute respiratory failure with hypoxia: Secondary | ICD-10-CM | POA: Diagnosis not present

## 2020-03-04 DIAGNOSIS — U071 COVID-19: Secondary | ICD-10-CM | POA: Diagnosis not present

## 2020-03-04 DIAGNOSIS — I10 Essential (primary) hypertension: Secondary | ICD-10-CM | POA: Diagnosis not present

## 2020-03-04 DIAGNOSIS — J1282 Pneumonia due to coronavirus disease 2019: Secondary | ICD-10-CM | POA: Diagnosis not present

## 2020-03-04 NOTE — Telephone Encounter (Signed)
Patient and HH notified yesterday. Will call when we receive results.

## 2020-03-04 NOTE — Telephone Encounter (Signed)
Patient states legs have been swelling to the last 2 days and she has been taking her fluid pill. This started after she was released from hospital due to [facility] on 9/6. Please advise

## 2020-03-04 NOTE — Telephone Encounter (Signed)
Spoke to pt --per Dr. Ladona Ridgel patient was advised to go to the urgent care so they can check her out as we do not have any appointments.

## 2020-03-07 ENCOUNTER — Telehealth: Payer: Self-pay

## 2020-03-07 ENCOUNTER — Encounter: Payer: Self-pay | Admitting: Family Medicine

## 2020-03-07 DIAGNOSIS — J9601 Acute respiratory failure with hypoxia: Secondary | ICD-10-CM | POA: Diagnosis not present

## 2020-03-07 DIAGNOSIS — U071 COVID-19: Secondary | ICD-10-CM | POA: Diagnosis not present

## 2020-03-07 DIAGNOSIS — G500 Trigeminal neuralgia: Secondary | ICD-10-CM | POA: Diagnosis not present

## 2020-03-07 DIAGNOSIS — G510 Bell's palsy: Secondary | ICD-10-CM | POA: Diagnosis not present

## 2020-03-07 DIAGNOSIS — G894 Chronic pain syndrome: Secondary | ICD-10-CM | POA: Diagnosis not present

## 2020-03-07 DIAGNOSIS — G35 Multiple sclerosis: Secondary | ICD-10-CM | POA: Diagnosis not present

## 2020-03-07 DIAGNOSIS — I4510 Unspecified right bundle-branch block: Secondary | ICD-10-CM | POA: Diagnosis not present

## 2020-03-07 DIAGNOSIS — I10 Essential (primary) hypertension: Secondary | ICD-10-CM | POA: Diagnosis not present

## 2020-03-07 DIAGNOSIS — A084 Viral intestinal infection, unspecified: Secondary | ICD-10-CM | POA: Diagnosis not present

## 2020-03-07 DIAGNOSIS — R112 Nausea with vomiting, unspecified: Secondary | ICD-10-CM | POA: Diagnosis not present

## 2020-03-07 DIAGNOSIS — J1282 Pneumonia due to coronavirus disease 2019: Secondary | ICD-10-CM | POA: Diagnosis not present

## 2020-03-07 DIAGNOSIS — E875 Hyperkalemia: Secondary | ICD-10-CM

## 2020-03-07 MED ORDER — POTASSIUM CHLORIDE CRYS ER 20 MEQ PO TBCR: 20.0000 meq | EXTENDED_RELEASE_TABLET | Freq: Every day | ORAL | 5 refills | Status: DC

## 2020-03-07 NOTE — Telephone Encounter (Signed)
Melinda Moore with [facility] calling to report potassium on 9/24 was 5.8  CB# [phone]

## 2020-03-07 NOTE — Telephone Encounter (Signed)
1.  Discontinue potassium in epic, direct patient to stop potassium #2-check metabolic 7 along with phosphorus in 10 to 14 days

## 2020-03-07 NOTE — Telephone Encounter (Signed)
Orders given to [facility] with [facility]: she stated patient just advised her that she was taking 4 OTC supplements that have 99 of Potassium in them. Consult with Dr Lorin Picket: Dr Lorin Picket advised that the patient should stop all OTC potassium and just continue the prescription potassium 20 meq one a day and repeat labs in 10-14 days. Shanda Bumps verbalized understanding and is notifying patient of new potassium instructions. :

## 2020-03-08 ENCOUNTER — Telehealth: Payer: Self-pay

## 2020-03-08 ENCOUNTER — Other Ambulatory Visit: Payer: Self-pay

## 2020-03-08 ENCOUNTER — Other Ambulatory Visit: Payer: Self-pay | Admitting: Family Medicine

## 2020-03-08 MED ORDER — POTASSIUM CHLORIDE CRYS ER 20 MEQ PO TBCR: 20.0000 meq | EXTENDED_RELEASE_TABLET | Freq: Every day | ORAL | 5 refills | Status: DC

## 2020-03-08 MED ORDER — TORSEMIDE 20 MG PO TABS: ORAL_TABLET | ORAL | 5 refills | Status: DC

## 2020-03-08 NOTE — Telephone Encounter (Signed)
Nurses This gets complicated Patient had recent Covid which puts her at increased risk of blood clots If she is feeling she is having a blood clot she should currently go to the ER Otherwise we are to see her tomorrow morning as a work in appointment 1130 to evaluate her legs we may need to send her for ultrasound and blood work depending on the results of our examination We can handle any prescription medicine at that time

## 2020-03-08 NOTE — Telephone Encounter (Signed)
Pt contacted and verbalized understanding. Medication sent to pharmacy  

## 2020-03-08 NOTE — Telephone Encounter (Signed)
Torsemide may be called in 20 mg with directions of 1 or 2 every morning as needed pedal edema, #60, 5 refills Please have patient bring all of her medications with her so we can review over these It is very important for her to bring the medicine not just from memory

## 2020-03-08 NOTE — Telephone Encounter (Signed)
Pt calling checking on her fluid pill. Pharmacy told her it has been discontinued.   [facility] - , [place] - [address]

## 2020-03-08 NOTE — Telephone Encounter (Signed)
Torsmide is not on med list. So I called pt she states she was told to stop potassium yesterday but not the fluid pill. She is taking 20mg  1 qam and one at noon. Gets #90 per month. [place]. I could not find any notes in system telling pt to stop med and pt states noone told her to stop so not sure why it got taken off of list. Do you want to refill?

## 2020-03-09 ENCOUNTER — Other Ambulatory Visit: Payer: Self-pay

## 2020-03-09 ENCOUNTER — Encounter: Payer: Self-pay | Admitting: Family Medicine

## 2020-03-09 ENCOUNTER — Ambulatory Visit (INDEPENDENT_AMBULATORY_CARE_PROVIDER_SITE_OTHER): Payer: Medicare Other | Admitting: Family Medicine

## 2020-03-09 VITALS — BP 130/76 | HR 120 | Temp 97.8°F | Wt 236.6 lb

## 2020-03-09 DIAGNOSIS — R112 Nausea with vomiting, unspecified: Secondary | ICD-10-CM | POA: Diagnosis not present

## 2020-03-09 DIAGNOSIS — J1282 Pneumonia due to coronavirus disease 2019: Secondary | ICD-10-CM | POA: Diagnosis not present

## 2020-03-09 DIAGNOSIS — G35 Multiple sclerosis: Secondary | ICD-10-CM | POA: Diagnosis not present

## 2020-03-09 DIAGNOSIS — U071 COVID-19: Secondary | ICD-10-CM | POA: Diagnosis not present

## 2020-03-09 DIAGNOSIS — E875 Hyperkalemia: Secondary | ICD-10-CM | POA: Diagnosis not present

## 2020-03-09 DIAGNOSIS — R600 Localized edema: Secondary | ICD-10-CM

## 2020-03-09 DIAGNOSIS — I10 Essential (primary) hypertension: Secondary | ICD-10-CM | POA: Diagnosis not present

## 2020-03-09 DIAGNOSIS — R0902 Hypoxemia: Secondary | ICD-10-CM | POA: Diagnosis not present

## 2020-03-09 DIAGNOSIS — G510 Bell's palsy: Secondary | ICD-10-CM | POA: Diagnosis not present

## 2020-03-09 DIAGNOSIS — G500 Trigeminal neuralgia: Secondary | ICD-10-CM | POA: Diagnosis not present

## 2020-03-09 DIAGNOSIS — I4510 Unspecified right bundle-branch block: Secondary | ICD-10-CM | POA: Diagnosis not present

## 2020-03-09 DIAGNOSIS — G894 Chronic pain syndrome: Secondary | ICD-10-CM | POA: Diagnosis not present

## 2020-03-09 DIAGNOSIS — A084 Viral intestinal infection, unspecified: Secondary | ICD-10-CM | POA: Diagnosis not present

## 2020-03-09 DIAGNOSIS — J9601 Acute respiratory failure with hypoxia: Secondary | ICD-10-CM | POA: Diagnosis not present

## 2020-03-09 MED ORDER — METHYLPHENIDATE HCL ER (LA) 20 MG PO CP24: 20.0000 mg | ORAL_CAPSULE | ORAL | 0 refills | Status: DC

## 2020-03-09 NOTE — Patient Instructions (Signed)
Between Oct 20 and 31 go do a chest xray

## 2020-03-09 NOTE — Progress Notes (Signed)
   Subjective:    Patient ID: Melinda Moore, female    DOB: 1970/12/24, 49 y.o.   MRN: [id number]  HPI Patient started experiencing swelling in her left leg Last Monday and right leg swelling slightly on Thursday. No pain, redness or warmth. Patient is taking torsemide and reports improvement in swelling today.  Patient relates some swelling in the lower legs.  This is dependent edema this been going on for long span of time Recently her potassium is elevated but it is mainly because he is taking OTC potassium in addition to prescription potassium she is stop the OTC potassium she does relates getting short of breath with activity but had a CAT scan while she was in the hospital with Covid and it was negative she does not have any calf pain or tenderness currently.  She did have Covid pneumonia on x-ray SOB since covid, using oxygen at all times.   Review of Systems Please see above    Objective:   Physical Exam Lungs clear respiratory rate normal heart regular no murmurs extremities some edema but no pitting edema no calf tenderness decent range of motion O2 sat on 2 L is 97%       Assessment & Plan:  Hypoxia, Covid pneumonia, dependency on oxygen for now May wean as she does better Albuterol. Repeat chest x-ray in 3 to 4 weeks Pedal edema torsemide each morning may take a second dose if necessary continue potassium one a day stop OTC potassium Check lab work again in 7 to 10 days Also Ritalin because of significant fatigue associated with MS she has been treated with a specialist by with this before and did well for her and therefore we will go ahead and renew it and she will follow-up in 4 to 6 weeks to recheck blood pressure MS stable being followed by specialist Flu vaccine recommended

## 2020-03-10 ENCOUNTER — Encounter: Payer: Self-pay | Admitting: Family Medicine

## 2020-03-11 MED ORDER — COMBIVENT RESPIMAT 20-100 MCG/ACT IN AERS: INHALATION_SPRAY | RESPIRATORY_TRACT | 2 refills | Status: DC

## 2020-03-11 NOTE — Telephone Encounter (Signed)
Nurses May send in Combivent 2 puffs 4 times daily as needed 2 refills In the long run if her lungs improved she would not need Combivent May use albuterol for rescue if necessary between these

## 2020-03-11 NOTE — Addendum Note (Signed)
Addended by: Marlowe Shores on: 03/11/2020 10:03 AM   Modules accepted: Orders

## 2020-03-16 DIAGNOSIS — J1282 Pneumonia due to coronavirus disease 2019: Secondary | ICD-10-CM | POA: Diagnosis not present

## 2020-03-16 DIAGNOSIS — J9601 Acute respiratory failure with hypoxia: Secondary | ICD-10-CM | POA: Diagnosis not present

## 2020-03-16 DIAGNOSIS — G894 Chronic pain syndrome: Secondary | ICD-10-CM | POA: Diagnosis not present

## 2020-03-16 DIAGNOSIS — U071 COVID-19: Secondary | ICD-10-CM | POA: Diagnosis not present

## 2020-03-16 DIAGNOSIS — G35 Multiple sclerosis: Secondary | ICD-10-CM | POA: Diagnosis not present

## 2020-03-16 DIAGNOSIS — R112 Nausea with vomiting, unspecified: Secondary | ICD-10-CM | POA: Diagnosis not present

## 2020-03-16 DIAGNOSIS — I4510 Unspecified right bundle-branch block: Secondary | ICD-10-CM | POA: Diagnosis not present

## 2020-03-16 DIAGNOSIS — A084 Viral intestinal infection, unspecified: Secondary | ICD-10-CM | POA: Diagnosis not present

## 2020-03-16 DIAGNOSIS — G500 Trigeminal neuralgia: Secondary | ICD-10-CM | POA: Diagnosis not present

## 2020-03-16 DIAGNOSIS — G510 Bell's palsy: Secondary | ICD-10-CM | POA: Diagnosis not present

## 2020-03-16 DIAGNOSIS — I10 Essential (primary) hypertension: Secondary | ICD-10-CM | POA: Diagnosis not present

## 2020-03-18 ENCOUNTER — Telehealth: Payer: Self-pay

## 2020-03-18 NOTE — Telephone Encounter (Signed)
Patient notified of Dr. Scott's recommendation.  

## 2020-03-18 NOTE — Telephone Encounter (Signed)
1.  The likelihood of blood clots is low #2 currently they do not recommend utilizing Lovenox shots after vaccines. #3 individuals who do have Covid are often treated in the hospital with blood thinners but that is because hospitalized individuals with Covid have a markedly higher risk of blood clots

## 2020-03-18 NOTE — Telephone Encounter (Signed)
Please advise. Thank you

## 2020-03-18 NOTE — Telephone Encounter (Signed)
Pt states a nurse last week told her moderna vaccine has caused blood clots in some pts. She got her first dose and wants to know if Dr. Lorin Picket wants her to do lovenox shots for a while since she has the blood clot anitbody

## 2020-03-23 DIAGNOSIS — J9601 Acute respiratory failure with hypoxia: Secondary | ICD-10-CM | POA: Diagnosis not present

## 2020-03-23 DIAGNOSIS — G500 Trigeminal neuralgia: Secondary | ICD-10-CM | POA: Diagnosis not present

## 2020-03-23 DIAGNOSIS — J1282 Pneumonia due to coronavirus disease 2019: Secondary | ICD-10-CM | POA: Diagnosis not present

## 2020-03-23 DIAGNOSIS — A084 Viral intestinal infection, unspecified: Secondary | ICD-10-CM | POA: Diagnosis not present

## 2020-03-23 DIAGNOSIS — I4510 Unspecified right bundle-branch block: Secondary | ICD-10-CM | POA: Diagnosis not present

## 2020-03-23 DIAGNOSIS — G35 Multiple sclerosis: Secondary | ICD-10-CM | POA: Diagnosis not present

## 2020-03-23 DIAGNOSIS — I10 Essential (primary) hypertension: Secondary | ICD-10-CM | POA: Diagnosis not present

## 2020-03-23 DIAGNOSIS — G894 Chronic pain syndrome: Secondary | ICD-10-CM | POA: Diagnosis not present

## 2020-03-23 DIAGNOSIS — U071 COVID-19: Secondary | ICD-10-CM | POA: Diagnosis not present

## 2020-03-23 DIAGNOSIS — G510 Bell's palsy: Secondary | ICD-10-CM | POA: Diagnosis not present

## 2020-03-23 DIAGNOSIS — R112 Nausea with vomiting, unspecified: Secondary | ICD-10-CM | POA: Diagnosis not present

## 2020-03-30 ENCOUNTER — Encounter: Payer: Self-pay | Admitting: Family Medicine

## 2020-03-30 NOTE — Telephone Encounter (Signed)
Discussed with pt. Pt verbalized understanding and she scheduled appt to have oxygen testing done

## 2020-03-30 NOTE — Telephone Encounter (Signed)
Nurses-more than likely this would need to be through home health agency. (Certainly the patient can search online to see if there is any providers of portable oxygen otherwise this will need to be through home health) As for [place] prescribing this this would require the patient doing a visit Checking the oxygen off of any O2 supplement Doing some ambulation off of O2 and checking the oxygen Then documenting what the oxygen level is with ambulation with 2 L of O2 or what ever level she is using Without this documentation and is unlikely that any insurance will cover this Typically we get this completed through pulmonary but it appears that pulmonary cannot see her until late November Please work with the patient help set this up this can be Essentially a nurse visit as long as I am brought into the picture regarding what the values are while she is present thank you

## 2020-04-07 ENCOUNTER — Ambulatory Visit (INDEPENDENT_AMBULATORY_CARE_PROVIDER_SITE_OTHER): Payer: Medicare Other | Admitting: Family Medicine

## 2020-04-07 ENCOUNTER — Encounter: Payer: Self-pay | Admitting: Family Medicine

## 2020-04-07 ENCOUNTER — Other Ambulatory Visit: Payer: Self-pay

## 2020-04-07 VITALS — BP 122/74 | HR 115 | Temp 97.5°F | Ht 68.0 in | Wt 235.0 lb

## 2020-04-07 DIAGNOSIS — Z79891 Long term (current) use of opiate analgesic: Secondary | ICD-10-CM

## 2020-04-07 DIAGNOSIS — Z79899 Other long term (current) drug therapy: Secondary | ICD-10-CM | POA: Diagnosis not present

## 2020-04-07 DIAGNOSIS — E875 Hyperkalemia: Secondary | ICD-10-CM | POA: Diagnosis not present

## 2020-04-07 MED ORDER — HYDROMORPHONE HCL 4 MG PO TABS: ORAL_TABLET | ORAL | 0 refills | Status: DC

## 2020-04-07 NOTE — Progress Notes (Signed)
Subjective:    Patient ID: Melinda Moore, female    DOB: Sep 20, 1970, 49 y.o.   MRN: [id number]  HPI This patient was seen today for chronic pain  The medication list was reviewed and updated.   -Compliance with medication: takes 3 a day  - Number patient states they take daily: 3  -when was the last dose patient took? today  The patient was advised the importance of maintaining medication and not using illegal substances with these.  Here for refills and follow up  The patient was educated that we can provide 3 monthly scripts for their medication, it is their responsibility to follow the instructions.  Side effects or complications from medications: none  Patient is aware that pain medications are meant to minimize the severity of the pain to allow their pain levels to improve to allow for better function. They are aware of that pain medications cannot totally remove their pain.  Due for UDT ( at least once per year) : last one was 01/25/20  Scale of 1 to 10 ( 1 is least 10 is most) Your pain level without the medicine: 7 Your pain level with medication: 4  Scale 1 to 10 ( 1-helps very little, 10 helps very well) How well does your pain medication reduce your pain so you can function better through out the day? 4  Needs oxygen testing to renew oxygen. Uses 3 liters of oxygen at home. Does not use oxygen when she goes out. Takes her inhaler with her when she goes out.   Would like to get flu vaccine. Next covid vaccine is due November 5th.   The patient states that she is desiring to work hard at tapering down on the pain medicine. She realizes that she may not need as much as she is taking currently. Her goal is to taper gradually half a tablet at a time every 1 to 2 weeks down to none if possible     Review of Systems  Constitutional: Negative for activity change, appetite change and fatigue.  HENT: Negative for congestion and rhinorrhea.   Respiratory: Negative for cough  and shortness of breath.   Cardiovascular: Negative for chest pain and leg swelling.  Gastrointestinal: Negative for abdominal pain and diarrhea.  Endocrine: Negative for polydipsia and polyphagia.  Skin: Negative for color change.  Neurological: Negative for dizziness and weakness.  Psychiatric/Behavioral: Negative for behavioral problems and confusion.       Objective:   Physical Exam Vitals reviewed.  Constitutional:      General: She is not in acute distress. HENT:     Head: Normocephalic and atraumatic.  Eyes:     General:        Right eye: No discharge.        Left eye: No discharge.  Neck:     Trachea: No tracheal deviation.  Cardiovascular:     Rate and Rhythm: Normal rate and regular rhythm.     Heart sounds: Normal heart sounds. No murmur heard.   Pulmonary:     Effort: Pulmonary effort is normal. No respiratory distress.     Breath sounds: Normal breath sounds.  Lymphadenopathy:     Cervical: No cervical adenopathy.  Skin:    General: Skin is warm and dry.  Neurological:     Mental Status: She is alert.     Coordination: Coordination normal.  Psychiatric:        Behavior: Behavior normal.  Assessment & Plan:  Chronic pain Drug registry checked Patient gradually tapering off of her dosing She will try going down to 2-1/2 tablets/day give [place] an update within 2 weeks with the goal of gradually tapering off we will help guide her on this one prescription was sent in  Patient had hypoxia with her Covid but now she is not having hypoxia and no longer needs oxygen

## 2020-04-26 ENCOUNTER — Encounter: Payer: Self-pay | Admitting: Family Medicine

## 2020-04-26 ENCOUNTER — Ambulatory Visit: Payer: Medicare Other | Admitting: Family Medicine

## 2020-04-26 DIAGNOSIS — J9601 Acute respiratory failure with hypoxia: Secondary | ICD-10-CM | POA: Diagnosis not present

## 2020-04-26 DIAGNOSIS — J1282 Pneumonia due to coronavirus disease 2019: Secondary | ICD-10-CM | POA: Diagnosis not present

## 2020-04-26 DIAGNOSIS — U071 COVID-19: Secondary | ICD-10-CM | POA: Diagnosis not present

## 2020-04-26 DIAGNOSIS — A084 Viral intestinal infection, unspecified: Secondary | ICD-10-CM | POA: Diagnosis not present

## 2020-04-27 ENCOUNTER — Encounter: Payer: Self-pay | Admitting: Family Medicine

## 2020-05-06 ENCOUNTER — Ambulatory Visit (HOSPITAL_COMMUNITY)
Admission: RE | Admit: 2020-05-06 | Discharge: 2020-05-06 | Disposition: A | Discharge status: Home / Self Care | Payer: 59 | Source: Ambulatory Visit | Attending: Pulmonary Disease | Admitting: Pulmonary Disease

## 2020-05-06 ENCOUNTER — Encounter: Payer: Self-pay | Admitting: Pulmonary Disease

## 2020-05-06 ENCOUNTER — Other Ambulatory Visit: Payer: Self-pay

## 2020-05-06 ENCOUNTER — Ambulatory Visit (INDEPENDENT_AMBULATORY_CARE_PROVIDER_SITE_OTHER): Payer: 59 | Admitting: Pulmonary Disease

## 2020-05-06 VITALS — BP 140/70 | HR 109 | Temp 97.1°F | Ht 69.0 in | Wt 221.0 lb

## 2020-05-06 DIAGNOSIS — J9601 Acute respiratory failure with hypoxia: Secondary | ICD-10-CM | POA: Diagnosis present

## 2020-05-06 DIAGNOSIS — U071 COVID-19: Secondary | ICD-10-CM | POA: Insufficient documentation

## 2020-05-06 DIAGNOSIS — J1282 Pneumonia due to coronavirus disease 2019: Secondary | ICD-10-CM

## 2020-05-06 NOTE — Progress Notes (Signed)
Subjective:    Patient ID: Melinda Moore, female    DOB: Sep 13, 1970, 49 y.o.   MRN: [id number]  HPI  Chief Complaint  Patient presents with  . Consult    Patient had Covid in August and is still having shortness of breath with exertion, in hospital 8/23- 9/6. Productive cough with clear sputum with some yellow tint occ. Wears 3 liters oxygen as needed    49 year old never smoker presents for shortness of breath and evaluation of hypoxia post Covid 01/2020.  She tested +8/23 and was hospitalized from 8/26-9/6 with hypoxia initially requiring 5 L but as high as 10 L during hospital admission.  CT imaging initially showed pneumomediastinum which resolved on follow-up CT, esophagram showed no leak but significant reflux up to clavicles  She was eventually discharged on 3 L oxygen and required a walker due to worsening of her MS and deconditioning.  Since then she has improved significantly and is able to go without oxygen however she reports class III dyspnea on daily activities.  She uses Combivent inhaler up to twice daily.  Dyspnea is worsened by movement and excitement and improves with resting She wants to know the prognosis of her dyspnea. She is maintained on torsemide for pedal edema  PMH -multiple sclerosis -diagnosed 1997, right-sided weakness, right Bell's palsy, numbness tingling both feet , on Neurontin and Ritalin, -depression on Wellbutrin and Zoloft -Hiatal hernia with reflux -History of DVT, "had antibody"?  Anticardiolipin IgG positive , did not need long-term anticoagulation -Chronic pain right knee, Dilaudid being tapered by PCP  Ambulatory saturation dropped from 100 to 96%, heart rate increased from 10 5-1 37 indicating significant deconditioning  Significant tests/ events reviewed  8/27 CT angiogram chest -bilateral infiltrates, pneumomediastinum 9/2 CT chest without contrast moderate hiatal hernia, increased bilateral infiltrates, resolved pneumomediastinum  8/27  esophagram-no leak , significant reflux up to clavicles  02/11/2020 venous duplex BLE >> neg  Past Medical History:  Diagnosis Date  . Anemia   . Anxiety   . Bell's palsy   . Depression   . DVT (deep venous thrombosis) (HCC) 09/02/2012   [place] on 02/13/2012 showed acute DVT in left calf veins and posterior tibial veins  . Fibromyalgia   . GERD (gastroesophageal reflux disease)   . History of blood transfusion 2000  . History of trigeminal neuralgia   . HTN (hypertension)    patient denies was taking a blood pressure medication in early 2000 but it was migraines  . Leukopenia   . MS (multiple sclerosis) (HCC) 1997  . Neuropathic pain   . Peripheral edema   . Port catheter in place 12/31/2012  . Right bundle branch block    Past Surgical History:  Procedure Laterality Date  . ABDOMINAL HYSTERECTOMY    . CHOLECYSTECTOMY    . ESOPHAGOGASTRODUODENOSCOPY  2011   Dr. Jena Gauss. Possible cervical esophageal with status post disruption with passage of Maloney dilator, glycol esophageal erythema, antral erosions. Biopsy from the stomach revealed minimal chronic inactive inflammation but negative for H. pylori  . LAPAROSCOPIC GASTRIC SLEEVE RESECTION N/A 04/17/2016   Procedure: LAPAROSCOPIC GASTRIC SLEEVE RESECTION WITH UPPER ENDOSCOPY;  Surgeon: Glenna Fellows, MD;  Location: [facility];  Service: General;  Laterality: N/A;  . PATELLA-FEMORAL ARTHROPLASTY Right 07/14/2019   Procedure: PATELLA-FEMORAL ARTHROPLASTY;  Surgeon: Teryl Lucy, MD;  Location: [facility];  Service: Orthopedics;  Laterality: Right;  . PORTACATH PLACEMENT    . TUBAL LIGATION  2001    Allergies  Allergen Reactions  .  Ambien [Zolpidem Tartrate]     nightmares  . Tape Itching and Rash    Social History   Socioeconomic History  . Marital status: Married    Spouse name: Not on file  . Number of children: 2  . Years of education: some college  . Highest education level: Not on file  Occupational History  . Occupation:  disabled  Tobacco Use  . Smoking status: Former Smoker    Packs/day: 0.25    Types: Cigarettes    Quit date: 1990    Years since quitting: 31.9  . Smokeless tobacco: Never Used  . Tobacco comment: 4 a day, quit in 1990  Vaping Use  . Vaping Use: Never used  Substance and Sexual Activity  . Alcohol use: No  . Drug use: No  . Sexual activity: Not on file  Other Topics Concern  . Not on file  Social History Narrative   Lives at home with husband and 2 daughters   Right handed   Takes 1/2 caffeine pill per day   Social Determinants of Health   Financial Resource Strain:   . Difficulty of Paying Living Expenses: Not on file  Food Insecurity:   . Worried About Programme researcher, broadcasting/film/video in the Last Year: Not on file  . Ran Out of Food in the Last Year: Not on file  Transportation Needs:   . Lack of Transportation (Medical): Not on file  . Lack of Transportation (Non-Medical): Not on file  Physical Activity:   . Days of Exercise per Week: Not on file  . Minutes of Exercise per Session: Not on file  Stress:   . Feeling of Stress : Not on file  Social Connections:   . Frequency of Communication with Friends and Family: Not on file  . Frequency of Social Gatherings with Friends and Family: Not on file  . Attends Religious Services: Not on file  . Active Member of Clubs or Organizations: Not on file  . Attends Banker Meetings: Not on file  . Marital Status: Not on file  Intimate Partner Violence:   . Fear of Current or Ex-Partner: Not on file  . Emotionally Abused: Not on file  . Physically Abused: Not on file  . Sexually Abused: Not on file     Family History  Problem Relation Age of Onset  . Heart disease Father   . Hyperlipidemia Father   . Congestive Heart Failure Father   . Hypertension Sister   . Other Paternal Uncle        Possible colon cancer, age greater than 21  . Cirrhosis Brother        etoh  . Healthy Mother   . Healthy Daughter   . Healthy  Daughter       Review of Systems  Chronic pain right knee Numbness and tingling both feet Mild pedal edema Right facial weakness Right side of her body weaker than the left  Constitutional: negative for anorexia, fevers and sweats  Eyes: negative for irritation, redness and visual disturbance  Ears, nose, mouth, throat, and face: negative for earaches, epistaxis, nasal congestion and sore throat  Respiratory: negative for cough, sputum and wheezing  Cardiovascular: negative for chest pain, orthopnea, palpitations and syncope  Gastrointestinal: negative for abdominal pain, constipation, diarrhea, melena, nausea and vomiting  Genitourinary:negative for dysuria, frequency and hematuria  Hematologic/lymphatic: negative for bleeding, easy bruising and lymphadenopathy  Musculoskeletal:negative for arthralgias, muscle weakness and stiff joints  Neurological: negative for coordination  problems, gait problems, headaches Endocrine: negative for diabetic symptoms including polydipsia, polyuria and weight loss     Objective:   Physical Exam  Gen. Pleasant, obese, in no distress, normal affect ENT - no pallor,icterus, no post nasal drip, class 2-3 airway Neck: No JVD, no thyromegaly, no carotid bruits Lungs: no use of accessory muscles, no dullness to percussion, decreased without rales or rhonchi  Cardiovascular: Rhythm regular, heart sounds  normal, no murmurs or gallops, no peripheral edema Abdomen: soft and non-tender, no hepatosplenomegaly, BS normal. Musculoskeletal: No deformities, no cyanosis or clubbing Neuro:  alert, non focal, no tremors       Assessment & Plan:

## 2020-05-06 NOTE — Assessment & Plan Note (Signed)
Persistent shortness of breath 3 months after Covid pneumonia. We will obtain chest x-ray for resolution of infiltrates.  Hypoxia has improved but still says that there has been significant improvement.  Pneumomediastinum has resolved even during her hospital stay. Persistent dyspnea may be related to deconditioning, weight gain or post Covid fibrosis. We will give her another 3 months and see how much she improves if symptoms persist at the 35-month mark we will proceed with high-resolution CT chest and PFTs to quantitate lung function

## 2020-05-06 NOTE — Assessment & Plan Note (Signed)
Seems like her hypoxemia has improved significantly. We can discontinue oxygen

## 2020-05-06 NOTE — Patient Instructions (Signed)
Ambulatory satn - based on this, we will discontinue oxygen CXR today  OK to use combivent every 6h as needed

## 2020-05-09 NOTE — Progress Notes (Signed)
Called and left message on voicemail to please return phone call for results. Contact number provided. 

## 2020-05-12 ENCOUNTER — Telehealth: Payer: Self-pay | Admitting: Pulmonary Disease

## 2020-05-12 MED ORDER — ALBUTEROL SULFATE HFA 108 (90 BASE) MCG/ACT IN AERS: 2.0000 | INHALATION_SPRAY | Freq: Four times a day (QID) | RESPIRATORY_TRACT | 2 refills | Status: DC | PRN

## 2020-05-12 NOTE — Telephone Encounter (Signed)
Infiltrates of covid pneumonia have almost completely cleared    Pt called back due to her having some questions about the cxr.  She is aware of RA's read but she wanted to know if the "almost completely cleared" means that she still has some in both lungs?  Is one lung better than the other?  She is still having some SHOB and is requesting that an albuterol inhaler be sent to her pharmacy.  RA please advise. Thanks

## 2020-05-12 NOTE — Progress Notes (Signed)
Called and left detailed message on voicemail  about xray results (per Dr Vassie Loll) per [facility]. Advised patient to please feel free to call back if any questions and/or concerns. Nothing further needed at this time.

## 2020-05-12 NOTE — Telephone Encounter (Signed)
ATC patient, left detailed message (as designated on [place]) providing information per Dr. Vassie Loll.  Advised to call with any questions.

## 2020-05-12 NOTE — Telephone Encounter (Signed)
Very mild infiltrate still present in both lungs but would be consistent with time course of healing Okay to send albuterol prescription

## 2020-06-14 ENCOUNTER — Ambulatory Visit (HOSPITAL_COMMUNITY): Payer: 59 | Attending: Neurology | Admitting: Physical Therapy

## 2020-06-14 ENCOUNTER — Other Ambulatory Visit: Payer: Self-pay

## 2020-06-14 DIAGNOSIS — M6281 Muscle weakness (generalized): Secondary | ICD-10-CM | POA: Diagnosis not present

## 2020-06-14 DIAGNOSIS — R262 Difficulty in walking, not elsewhere classified: Secondary | ICD-10-CM | POA: Diagnosis not present

## 2020-06-14 NOTE — Therapy (Signed)
[facility] [address] [place], [place], [address] Phone: [phone]   Fax:  [phone]  Physical Therapy Evaluation  Patient Details  Name: Melinda Moore MRN: [id number] Date of Birth: Sep 13, 1970 Referring Provider (PT): Harlen Labs, MD   Encounter Date: 06/14/2020   PT End of Session - 06/14/20 1110    Visit Number 1    Number of Visits 12    Date for PT Re-Evaluation 07/26/20    Authorization Type [employer]- 60 VL between PT/OT/SP 0 used, secondary medicar - no VL no auth required    Authorization - Visit Number 1    Authorization - Number of Visits 60    Progress Note Due on Visit 10    PT Start Time 1120    PT Stop Time 1150    PT Time Calculation (min) 30 min    Activity Tolerance Patient tolerated treatment well    Behavior During Therapy [facility] for tasks assessed/performed           Past Medical History:  Diagnosis Date  . Anemia   . Anxiety   . Bell's palsy   . Depression   . DVT (deep venous thrombosis) (HCC) 09/02/2012   [place] on 02/13/2012 showed acute DVT in left calf veins and posterior tibial veins  . Fibromyalgia   . GERD (gastroesophageal reflux disease)   . History of blood transfusion 2000  . History of trigeminal neuralgia   . HTN (hypertension)    patient denies was taking a blood pressure medication in early 2000 but it was migraines  . Leukopenia   . MS (multiple sclerosis) (HCC) 1997  . Neuropathic pain   . Peripheral edema   . Port catheter in place 12/31/2012  . Right bundle branch block     Past Surgical History:  Procedure Laterality Date  . ABDOMINAL HYSTERECTOMY    . CHOLECYSTECTOMY    . ESOPHAGOGASTRODUODENOSCOPY  2011   Dr. Jena Gauss. Possible cervical esophageal with status post disruption with passage of Maloney dilator, glycol esophageal erythema, antral erosions. Biopsy from the stomach revealed minimal chronic inactive inflammation but negative for H. pylori  . LAPAROSCOPIC GASTRIC  SLEEVE RESECTION N/A 04/17/2016   Procedure: LAPAROSCOPIC GASTRIC SLEEVE RESECTION WITH UPPER ENDOSCOPY;  Surgeon: Glenna Fellows, MD;  Location: [facility];  Service: General;  Laterality: N/A;  . PATELLA-FEMORAL ARTHROPLASTY Right 07/14/2019   Procedure: PATELLA-FEMORAL ARTHROPLASTY;  Surgeon: Teryl Lucy, MD;  Location: [facility];  Service: Orthopedics;  Laterality: Right;  . PORTACATH PLACEMENT    . TUBAL LIGATION  2001    There were no vitals filed for this visit.    Subjective Assessment - 06/14/20 1130    Subjective States that since she was last in PT she had COVID and was in the hospital for 2 weeks and when she went home she was walking with a walker. States the COVID exacerbated her MS. States she is having some lingering COVID symptoms with her breathing. States that she saw her pulmonologist 3 weeks ago and she now has 2 inhalers. States that she follows up with him in  54months. States that she is no longer on oxygen, she got off of that right before Christmas. States that walking and walking while talking is still very difficult. States her lungs still show the remnants of COVID.    Pertinent History MS COVID March 2021    Patient Stated Goals to have better stability with her legs, want better posture with walking  Currently in Pain? Yes    Pain Score 6     Pain Location Leg    Pain Orientation Right    Pain Descriptors / Indicators Aching;Dull    Pain Onset More than a month ago    Pain Frequency Constant    Aggravating Factors  no change    Pain Relieving Factors no change              [facility] PT Assessment - 06/14/20 0001      Assessment   Medical Diagnosis Mutiple sclerosis    Referring Provider (PT) Harlen Labs, MD    Next MD Visit March    Prior Therapy yes previously here      Precautions   Precautions --   shortness of breath with exertion     Balance Screen   Has the patient fallen in the past 6 months No      Home Environment   Living Environment  Private residence    Type of Home House    Home Access Stairs to enter    Entrance Stairs-Number of Steps 2    Entrance Stairs-Rails None    Home Layout Two level;Able to live on main level with bedroom/bathroom    Home Equipment Walker - 2 wheels;Cane - single point;Shower seat      Prior Function   Level of Independence Independent with basic ADLs      Cognition   Overall Cognitive Status Within Functional Limits for tasks assessed      Observation/Other Assessments   Focus on Therapeutic Outcomes (FOTO)  NA      Transfers   Five time sit to stand comments  16.03   hands on thighs, did not stand upright.     Ambulation/Gait   Ambulation/Gait Yes    Ambulation/Gait Assistance 6: Modified independent (Device/Increase time)    Ambulation Distance (Feet) 372 Feet    Assistive device None    Gait Pattern Decreased dorsiflexion - left;Decreased dorsiflexion - right;Decreased hip/knee flexion - left;Decreased hip/knee flexion - right;Decreased arm swing - left;Step-through pattern;Trunk flexed;Decreased trunk rotation    Ambulation Surface Level;Indoor    Gait velocity decreased    Gait Comments , shortness of brath noted after - required inhaler      Balance   Balance Assessed --   SLS left 12 seconds, R SLS 3 seconds                     Objective measurements completed on examination: See above findings.       [facility] Adult PT Treatment/Exercise - 06/14/20 0001      Exercises   Exercises Knee/Hip      Knee/Hip Exercises: Seated   Other Seated Knee/Hip Exercises long exhale - x1                  PT Education - 06/14/20 1149    Education Details on POC, PT focus, breathing execises and current presentation    Person(s) Educated Patient    Methods Explanation    Comprehension Verbalized understanding            PT Short Term Goals - 06/14/20 1132      PT SHORT TERM GOAL #1   Title Patient will report at least 50% improvement in overall  symptoms and/or function to demonstrate improved functional mobility    Time 3    Period Weeks    Status New    Target Date 07/05/20  PT SHORT TERM GOAL #2   Title Patient will be independent in self management strategies to improve quality of life and functional outcomes.    Time 3    Period Weeks    Status New    Target Date 07/05/20      PT SHORT TERM GOAL #3   Title Patient will be able to perform 2 minute walk test without shortness of breath to improve functional endurance.    Time 3    Period Weeks    Status New    Target Date 07/05/20             PT Long Term Goals - 06/14/20 1132      PT LONG TERM GOAL #1   Title Patient will report at least 75% improvement in overall symptoms and/or function to demonstrate improved functional mobility    Time 6    Period Weeks    Status New    Target Date 07/26/20      PT LONG TERM GOAL #2   Title Patient will be able to stand for at least 15 seconds on either leg in single leg stance to improve static balance    Time 6    Period Weeks    Status New    Target Date 07/26/20      PT LONG TERM GOAL #3   Title Patient will be able to ambulate with fair posture (not dragging feet and reduced slumped posture) for entire 2 minutes during 2 minute walk test to improve functional mobility and posture.    Time 6    Period Weeks    Status New    Target Date 07/26/20                  Plan - 06/14/20 1152    Clinical Impression Statement Patient is known to clinic for previous treatment for difficulties walking and muscle weakness secondary to multiple sclerosis. Since last therapy plan of care, patient was diagnosed with COVID and admitted ot the hospital where she only came off of oxygen about 2 weeks ago. Patient with poor tolerance to functional mobility secondary to deconditioning and shortness of breath and poor posture and balance. Patient would benefit from skilled physical therapy to improve functional mobility and  return her to optimal function.    Personal Factors and Comorbidities Comorbidity 1;Comorbidity 2    Comorbidities MS, hx of COVID and post COVID symptoms    Examination-Activity Limitations Locomotion Level;Transfers;Stand;Stairs;Squat    Examination-Participation Restrictions Community Activity;Cleaning    Stability/Clinical Decision Making Stable/Uncomplicated    Clinical Decision Making Low    Rehab Potential Good    PT Frequency 2x / week    PT Duration 6 weeks    PT Treatment/Interventions ADLs/Self Care Home Management;Aquatic Therapy;Cryotherapy;Electrical Stimulation;Moist Heat;Traction;Balance training;Therapeutic exercise;Therapeutic activities;Functional mobility training;Stair training;Gait training;Neuromuscular re-education;Patient/family education;Manual techniques;Dry needling;Joint Manipulations    PT Next Visit Plan focus on balance, endurance,walking, posture and LE strengthening    PT Home Exercise Plan long exhale breath work    Oncologist with Plan of Care Patient           Patient will benefit from skilled therapeutic intervention in order to improve the following deficits and impairments:  Pain,Abnormal gait,Decreased balance,Decreased mobility,Decreased range of motion,Decreased activity tolerance,Decreased endurance,Postural dysfunction  Visit Diagnosis: Difficulty in walking, not elsewhere classified  Muscle weakness (generalized)     Problem List Patient Active Problem List   Diagnosis Date Noted  . Acute hypoxemic respiratory  failure due to COVID-19 (HCC) 02/06/2020  . Pneumonia due to COVID-19 virus 02/04/2020  . Elevated transaminase level 02/04/2020  . Patellofemoral arthritis of right knee 07/14/2019  . History of DVT in adulthood 06/23/2019  . Right leg pain 03/12/2017  . Gait abnormality 03/12/2017  . Radiculopathy due to lumbar intervertebral disc disorder 09/21/2016  . Spondylosis without myelopathy or radiculopathy, lumbar region  09/21/2016  . History of anxiety 09/08/2016  . Primary osteoarthritis of right knee 08/30/2016  . Primary insomnia 08/30/2016  . Myofascial pain 08/30/2016  . Sciatica, right side 12/12/2015  . Right knee pain 12/12/2015  . Morbid obesity (HCC) 08/12/2015  . Lumbago 02/10/2014  . Chronic anxiety 07/08/2013  . Swelling of breast 04/08/2013  . Chronic pain syndrome 03/11/2013  . Port catheter in place 12/31/2012  . Unspecified constipation 10/01/2012  . Anemia 10/01/2012  . Rectal bleeding 10/01/2012  . Elevated alkaline phosphatase level 10/01/2012  . MS (multiple sclerosis) (HCC) 09/02/2012  . DVT (deep venous thrombosis) (HCC) 09/02/2012  . ANEMIA 04/12/2010  . OTHER DYSPHAGIA 04/12/2010    11:58 AM, 06/14/20 Tereasa Coop, DPT Physical Therapy with [facility]  [phone] office  [facility] [address] [place], [place], [address] Phone: [phone]   Fax:  [phone]  Name: Melinda Moore MRN: [id number] Date of Birth: 1970/06/18

## 2020-06-16 ENCOUNTER — Ambulatory Visit (HOSPITAL_COMMUNITY): Payer: 59 | Admitting: Physical Therapy

## 2020-06-16 ENCOUNTER — Telehealth (HOSPITAL_COMMUNITY): Payer: Self-pay | Admitting: Physical Therapy

## 2020-06-16 NOTE — Telephone Encounter (Signed)
pt called to cx this appt due to she has a cold.

## 2020-06-21 ENCOUNTER — Other Ambulatory Visit: Payer: Self-pay | Admitting: Family Medicine

## 2020-06-21 ENCOUNTER — Telehealth: Payer: Self-pay

## 2020-06-21 ENCOUNTER — Ambulatory Visit (HOSPITAL_COMMUNITY): Payer: 59

## 2020-06-21 ENCOUNTER — Other Ambulatory Visit: Payer: Self-pay

## 2020-06-21 ENCOUNTER — Encounter (HOSPITAL_COMMUNITY): Payer: Self-pay

## 2020-06-21 DIAGNOSIS — R262 Difficulty in walking, not elsewhere classified: Secondary | ICD-10-CM | POA: Diagnosis not present

## 2020-06-21 DIAGNOSIS — M6281 Muscle weakness (generalized): Secondary | ICD-10-CM

## 2020-06-21 NOTE — Telephone Encounter (Signed)
Med check up 04/07/20

## 2020-06-21 NOTE — Therapy (Signed)
[facility] [address] [place], [place], [address] Phone: [phone]   Fax:  [phone]  Physical Therapy Treatment  Patient Details  Name: Melinda Moore MRN: [id number] Date of Birth: 1970/09/13 Referring Provider (PT): Harlen Labs, MD   Encounter Date: 06/21/2020   PT End of Session - 06/21/20 1057    Visit Number 2    Number of Visits 12    Date for PT Re-Evaluation 07/26/20    Authorization Type united healthcare- 60 VL between PT/OT/SP 0 used, secondary medicar - no VL no auth required    Authorization - Visit Number 2    Authorization - Number of Visits 60    Progress Note Due on Visit 10    PT Start Time 1047    PT Stop Time 1128    PT Time Calculation (min) 41 min    Activity Tolerance Patient tolerated treatment well    Behavior During Therapy [facility] for tasks assessed/performed           Past Medical History:  Diagnosis Date  . Anemia   . Anxiety   . Bell's palsy   . Depression   . DVT (deep venous thrombosis) (HCC) 09/02/2012   [place] on 02/13/2012 showed acute DVT in left calf veins and posterior tibial veins  . Fibromyalgia   . GERD (gastroesophageal reflux disease)   . History of blood transfusion 2000  . History of trigeminal neuralgia   . HTN (hypertension)    patient denies was taking a blood pressure medication in early 2000 but it was migraines  . Leukopenia   . MS (multiple sclerosis) (HCC) 1997  . Neuropathic pain   . Peripheral edema   . Port catheter in place 12/31/2012  . Right bundle branch block     Past Surgical History:  Procedure Laterality Date  . ABDOMINAL HYSTERECTOMY    . CHOLECYSTECTOMY    . ESOPHAGOGASTRODUODENOSCOPY  2011   Dr. Jena Gauss. Possible cervical esophageal with status post disruption with passage of Maloney dilator, glycol esophageal erythema, antral erosions. Biopsy from the stomach revealed minimal chronic inactive inflammation but negative for H. pylori  . LAPAROSCOPIC GASTRIC  SLEEVE RESECTION N/A 04/17/2016   Procedure: LAPAROSCOPIC GASTRIC SLEEVE RESECTION WITH UPPER ENDOSCOPY;  Surgeon: Glenna Fellows, MD;  Location: [facility];  Service: General;  Laterality: N/A;  . PATELLA-FEMORAL ARTHROPLASTY Right 07/14/2019   Procedure: PATELLA-FEMORAL ARTHROPLASTY;  Surgeon: Teryl Lucy, MD;  Location: [facility];  Service: Orthopedics;  Laterality: Right;  . PORTACATH PLACEMENT    . TUBAL LIGATION  2001    There were no vitals filed for this visit.   Subjective Assessment - 06/21/20 1051    Subjective Pt arrived with reports of stiffness in Rt knee, pain scale 5/10.  Reports she gets short of breath following talking for long periods of time.  Reports minimal fatigue today.    Pertinent History MS COVID March 2021    Patient Stated Goals to have better stability with her legs, want better posture with walking    Currently in Pain? Yes    Pain Score 5     Pain Location Leg    Pain Orientation Right    Pain Descriptors / Indicators Tightness;Sore    Pain Type Chronic pain    Pain Onset More than a month ago    Pain Frequency Constant    Aggravating Factors  no change    Pain Relieving Factors no change  [facility] Adult PT Treatment/Exercise - 06/21/20 0001      Ambulation/Gait   Ambulation/Gait Yes    Ambulation/Gait Assistance 6: Modified independent (Device/Increase time)    Ambulation Distance (Feet) 394 Feet    Assistive device None    Gait Pattern Decreased dorsiflexion - left;Decreased dorsiflexion - right;Decreased hip/knee flexion - left;Decreased hip/knee flexion - right;Decreased arm swing - left;Step-through pattern;Trunk flexed;Decreased trunk rotation    Ambulation Surface Level;Indoor    Gait velocity decreased    Gait Comments , shortness of breath and fatigue level increased from 4-->7/10      Exercises   Exercises Knee/Hip      Knee/Hip Exercises: Stretches   Other Knee/Hip Stretches standing  extension      Knee/Hip Exercises: Standing   Heel Raises Both;10 reps    Heel Raises Limitations toe raises with UE support    Other Standing Knee Exercises RTB shoulder extension and rows      Knee/Hip Exercises: Seated   Other Seated Knee/Hip Exercises Deep breathing x 2 minutes    Sit to Sand 2 sets;5 reps;without UE support      Knee/Hip Exercises: Supine   Bridges 2 sets;5 reps                  PT Education - 06/21/20 1311    Education Details Reviewed goals, educated importance of HEP compliance for maximal benefits.  Educated proper deep breathing to address fatigue at c/o SOB with verbal and tactile cueing.  Established additional HEP for strengthening and mobility.    Person(s) Educated Patient    Methods Explanation;Demonstration;Handout    Comprehension Verbalized understanding            PT Short Term Goals - 06/14/20 1132      PT SHORT TERM GOAL #1   Title Patient will report at least 50% improvement in overall symptoms and/or function to demonstrate improved functional mobility    Time 3    Period Weeks    Status New    Target Date 07/05/20      PT SHORT TERM GOAL #2   Title Patient will be independent in self management strategies to improve quality of life and functional outcomes.    Time 3    Period Weeks    Status New    Target Date 07/05/20      PT SHORT TERM GOAL #3   Title Patient will be able to perform 2 minute walk test without shortness of breath to improve functional endurance.    Time 3    Period Weeks    Status New    Target Date 07/05/20             PT Long Term Goals - 06/14/20 1132      PT LONG TERM GOAL #1   Title Patient will report at least 75% improvement in overall symptoms and/or function to demonstrate improved functional mobility    Time 6    Period Weeks    Status New    Target Date 07/26/20      PT LONG TERM GOAL #2   Title Patient will be able to stand for at least 15 seconds on either leg in single leg  stance to improve static balance    Time 6    Period Weeks    Status New    Target Date 07/26/20      PT LONG TERM GOAL #3   Title Patient will be able to ambulate with fair  posture (not dragging feet and reduced slumped posture) for entire 2 minutes during 2 minute walk test to improve functional mobility and posture.    Time 6    Period Weeks    Status New    Target Date 07/26/20                 Plan - 06/21/20 1315    Clinical Impression Statement Began session reviewing goals, educated importance of HEP compliance for maximal benefits.  Pt able to recall breathing HEP, cueing to improve posture and deep breathing, assistance with tactile cueing on chest/stomach to improve awareness.  Session focus on LE strengthening, mobility, proper breathing and activity tolerance.  Following reports of SOB and fatigue increase to 7/10.  Pt presents with increased flexed trunk during gait and decreased ankle mobility.  Educated importance of posture to assist with pain control and improved lung capacity.  Added gentle strengthening exercises to assist with reduced fatigue.  Discussed fatigue with MS and encouraged pt to keep fatigue levels 4/10 or less.  Pt given printout of bridges, STS, lumbar extension and heel raises to progress strength and mobility.    Personal Factors and Comorbidities Comorbidity 1;Comorbidity 2    Comorbidities MS, hx of COVID and post COVID symptoms    Examination-Activity Limitations Locomotion Level;Transfers;Stand;Stairs;Squat    Examination-Participation Restrictions Community Activity;Cleaning    Stability/Clinical Decision Making Stable/Uncomplicated    Clinical Decision Making Low    Rehab Potential Good    PT Frequency 2x / week    PT Duration 6 weeks    PT Treatment/Interventions ADLs/Self Care Home Management;Aquatic Therapy;Cryotherapy;Electrical Stimulation;Moist Heat;Traction;Balance training;Therapeutic exercise;Therapeutic activities;Functional  mobility training;Stair training;Gait training;Neuromuscular re-education;Patient/family education;Manual techniques;Dry needling;Joint Manipulations    PT Next Visit Plan Next session add hip flexor and slant board stretches, continue gluteal strengthening.  focus on balance, endurance,walking, posture and LE strengthening    PT Home Exercise Plan long exhale breath work; 1/11: STS, bridge, extension and heel/toe           Patient will benefit from skilled therapeutic intervention in order to improve the following deficits and impairments:  Pain,Abnormal gait,Decreased balance,Decreased mobility,Decreased range of motion,Decreased activity tolerance,Decreased endurance,Postural dysfunction  Visit Diagnosis: Difficulty in walking, not elsewhere classified  Muscle weakness (generalized)     Problem List Patient Active Problem List   Diagnosis Date Noted  . Acute hypoxemic respiratory failure due to COVID-19 (HCC) 02/06/2020  . Pneumonia due to COVID-19 virus 02/04/2020  . Elevated transaminase level 02/04/2020  . Patellofemoral arthritis of right knee 07/14/2019  . History of DVT in adulthood 06/23/2019  . Right leg pain 03/12/2017  . Gait abnormality 03/12/2017  . Radiculopathy due to lumbar intervertebral disc disorder 09/21/2016  . Spondylosis without myelopathy or radiculopathy, lumbar region 09/21/2016  . History of anxiety 09/08/2016  . Primary osteoarthritis of right knee 08/30/2016  . Primary insomnia 08/30/2016  . Myofascial pain 08/30/2016  . Sciatica, right side 12/12/2015  . Right knee pain 12/12/2015  . Morbid obesity (HCC) 08/12/2015  . Lumbago 02/10/2014  . Chronic anxiety 07/08/2013  . Swelling of breast 04/08/2013  . Chronic pain syndrome 03/11/2013  . Port catheter in place 12/31/2012  . Unspecified constipation 10/01/2012  . Anemia 10/01/2012  . Rectal bleeding 10/01/2012  . Elevated alkaline phosphatase level 10/01/2012  . MS (multiple sclerosis) (HCC)  09/02/2012  . DVT (deep venous thrombosis) (HCC) 09/02/2012  . ANEMIA 04/12/2010  . OTHER DYSPHAGIA 04/12/2010   Becky Sax, LPTA/CLT; [facility] [phone]  Juel Burrow 06/21/2020, 1:27 PM  [facility] [address] [place], [place], [address] Phone: [phone]   Fax:  [phone]  Name: Melinda Moore MRN: [id number] Date of Birth: [birth year]

## 2020-06-21 NOTE — Telephone Encounter (Signed)
Long Term Disability Form placed in signature folder

## 2020-06-21 NOTE — Patient Instructions (Addendum)
Bridge    Lie back, legs bent. Inhale, pressing hips up. Keeping ribs in, lengthen lower back. Exhale, rolling down along spine from top. Repeat 10 times, 5" holds. Do 2 sessions per day.  http://pm.exer.us/55   Copyright  VHI. All rights reserved.   Functional Quadriceps: Sit to Stand    Sit on edge of chair, feet flat on floor. Stand upright, extending knees fully. Repeat 5-10 times per set watching fatigue levels. Do 2 sets per day.  http://orth.exer.us/735   Copyright  VHI. All rights reserved.    Toe / Heel Raise (Standing)    Standing with support, raise heels, then rock back on heels and raise toes. Repeat 10 times.  Copyright  VHI. All rights reserved.   Standing Arch (Extension)    Place hands in small of back. Using hands as fulcrum, arch backward. Try to keep knees straight. Great exercise if sitting makes pain worse. Use to break up long periods of sitting. Repeat 10 times. Do 2 sessions per day.  http://gt2.exer.us/248   Copyright  VHI. All rights reserved.

## 2020-06-22 NOTE — Telephone Encounter (Signed)
Form was completed please see Please assist with completing the other parts thank you

## 2020-06-23 ENCOUNTER — Telehealth (HOSPITAL_COMMUNITY): Payer: Self-pay | Admitting: Physical Therapy

## 2020-06-23 ENCOUNTER — Ambulatory Visit (HOSPITAL_COMMUNITY): Payer: 59 | Admitting: Physical Therapy

## 2020-06-23 NOTE — Telephone Encounter (Signed)
pt lmonvm to cx today's appt and reschedule for next week.

## 2020-06-29 ENCOUNTER — Ambulatory Visit (HOSPITAL_COMMUNITY): Payer: 59

## 2020-06-29 ENCOUNTER — Telehealth (INDEPENDENT_AMBULATORY_CARE_PROVIDER_SITE_OTHER): Payer: 59 | Admitting: Family Medicine

## 2020-06-29 ENCOUNTER — Other Ambulatory Visit: Payer: Self-pay

## 2020-06-29 DIAGNOSIS — Z79891 Long term (current) use of opiate analgesic: Secondary | ICD-10-CM

## 2020-06-29 MED ORDER — HYDROMORPHONE HCL 4 MG PO TABS: ORAL_TABLET | ORAL | 0 refills | Status: DC

## 2020-06-29 NOTE — Progress Notes (Signed)
Subjective:    Patient ID: Melinda Moore, female    DOB: 02-09-1971, 50 y.o.   MRN: [id number]  HPI Virtual Visit via Telephone Note  I connected with Melinda Moore on 06/29/20 at  1:10 PM EST by telephone and verified that I am speaking with the correct person using two identifiers.  Location: Patient: home Provider: office   I discussed the limitations, risks, security and privacy concerns of performing an evaluation and management service by telephone and the availability of in person appointments. I also discussed with the patient that there may be a patient responsible charge related to this service. The patient expressed understanding and agreed to proceed.   History of Present Illness:    Observations/Objective:   Assessment and Plan:   Follow Up Instructions:    I discussed the assessment and treatment plan with the patient. The patient was provided an opportunity to ask questions and all were answered. The patient agreed with the plan and demonstrated an understanding of the instructions.   The patient was advised to call back or seek an in-person evaluation if the symptoms worsen or if the condition fails to improve as anticipated.  I provided 25  minutes of non-face-to-face time during this encounter.  This patient was seen today for chronic pain  The medication list was reviewed and updated.   -Compliance with medication: yes  - Number patient states they take daily: 3 per day  -when was the last dose patient took? 10:00 this morning  The patient was advised the importance of maintaining medication and not using illegal substances with these.  Here for refills and follow up  The patient was educated that we can provide 3 monthly scripts for their medication, it is their responsibility to follow the instructions.  Side effects or complications from medications: none  Patient is aware that pain medications are meant to minimize the severity of the pain to  allow their pain levels to improve to allow for better function. They are aware of that pain medications cannot totally remove their pain.  Due for UDT ( at least once per year) : 01/24/21  Scale of 1 to 10 ( 1 is least 10 is most) Your pain level without the medicine: 7 Your pain level with medication 3  Scale 1 to 10 ( 1-helps very little, 10 helps very well) How well does your pain medication reduce your pain so you can function better through out the day? 8     Review of Systems  Constitutional: Negative for activity change and appetite change.  HENT: Negative for congestion and rhinorrhea.   Respiratory: Negative for cough and shortness of breath.   Cardiovascular: Negative for chest pain and leg swelling.  Gastrointestinal: Negative for abdominal pain, nausea and vomiting.  Musculoskeletal: Positive for back pain.  Skin: Negative for color change.  Neurological: Negative for dizziness and weakness.  Psychiatric/Behavioral: Negative for agitation and confusion.       Objective:   Physical Exam  Today's visit was via telephone Physical exam was not possible for this visit       Assessment & Plan:  The patient was seen in followup for chronic pain. A review over at their current pain status was discussed. Drug registry was checked. Prescriptions were given.  Regular follow-up recommended. Discussion was held regarding the importance of compliance with medication as well as pain medication contract.  Patient was informed that medication may cause drowsiness and should not be combined  with other medications/alcohol or street drugs. If the patient feels medication is causing altered alertness then do not drive or operate dangerous equipment.  2 prescriptions were sent in We will do a follow-up with her in early March If she is doing better we will start tapering down on her Dilaudid

## 2020-06-30 ENCOUNTER — Ambulatory Visit (HOSPITAL_COMMUNITY): Payer: 59 | Admitting: Physical Therapy

## 2020-07-04 ENCOUNTER — Ambulatory Visit (HOSPITAL_COMMUNITY): Payer: 59 | Admitting: Physical Therapy

## 2020-07-05 ENCOUNTER — Ambulatory Visit (HOSPITAL_COMMUNITY): Payer: 59 | Admitting: Physical Therapy

## 2020-07-05 ENCOUNTER — Other Ambulatory Visit: Payer: Self-pay

## 2020-07-05 ENCOUNTER — Other Ambulatory Visit: Payer: Self-pay | Admitting: Family Medicine

## 2020-07-05 DIAGNOSIS — R262 Difficulty in walking, not elsewhere classified: Secondary | ICD-10-CM

## 2020-07-05 DIAGNOSIS — M6281 Muscle weakness (generalized): Secondary | ICD-10-CM | POA: Diagnosis not present

## 2020-07-05 NOTE — Telephone Encounter (Signed)
Last visit 06/29/20 for pain management. Med is under historical provider

## 2020-07-05 NOTE — Therapy (Signed)
[facility] [address] [place], [place], [address] Phone: [phone]   Fax:  [phone]  Physical Therapy Treatment  Patient Details  Name: Melinda Moore MRN: [id number] Date of Birth: 1970-09-19 Referring Provider (PT): Harlen Labs, MD   Encounter Date: 07/05/2020   PT End of Session - 07/05/20 1542    Visit Number 3    Number of Visits 12    Date for PT Re-Evaluation 07/26/20    Authorization Type united healthcare- 60 VL between PT/OT/SP 0 used, secondary medicar - no VL no auth required    Authorization - Visit Number 3    Authorization - Number of Visits 60    Progress Note Due on Visit 10    PT Start Time 1050    PT Stop Time 1130    PT Time Calculation (min) 40 min    Activity Tolerance Patient tolerated treatment well    Behavior During Therapy [facility] for tasks assessed/performed           Past Medical History:  Diagnosis Date  . Anemia   . Anxiety   . Bell's palsy   . Depression   . DVT (deep venous thrombosis) (HCC) 09/02/2012   [place] on 02/13/2012 showed acute DVT in left calf veins and posterior tibial veins  . Fibromyalgia   . GERD (gastroesophageal reflux disease)   . History of blood transfusion 2000  . History of trigeminal neuralgia   . HTN (hypertension)    patient denies was taking a blood pressure medication in early 2000 but it was migraines  . Leukopenia   . MS (multiple sclerosis) (HCC) 1997  . Neuropathic pain   . Peripheral edema   . Port catheter in place 12/31/2012  . Right bundle branch block     Past Surgical History:  Procedure Laterality Date  . ABDOMINAL HYSTERECTOMY    . CHOLECYSTECTOMY    . ESOPHAGOGASTRODUODENOSCOPY  2011   Dr. Jena Gauss. Possible cervical esophageal with status post disruption with passage of Maloney dilator, glycol esophageal erythema, antral erosions. Biopsy from the stomach revealed minimal chronic inactive inflammation but negative for H. pylori  . LAPAROSCOPIC GASTRIC  SLEEVE RESECTION N/A 04/17/2016   Procedure: LAPAROSCOPIC GASTRIC SLEEVE RESECTION WITH UPPER ENDOSCOPY;  Surgeon: Glenna Fellows, MD;  Location: [facility];  Service: General;  Laterality: N/A;  . PATELLA-FEMORAL ARTHROPLASTY Right 07/14/2019   Procedure: PATELLA-FEMORAL ARTHROPLASTY;  Surgeon: Teryl Lucy, MD;  Location: [facility];  Service: Orthopedics;  Laterality: Right;  . PORTACATH PLACEMENT    . TUBAL LIGATION  2001    There were no vitals filed for this visit.   Subjective Assessment - 07/05/20 1106    Subjective pt states she is doing well wtihout issues.  Working on getting her strength back.    Currently in Pain? No/denies                             [facility] Adult PT Treatment/Exercise - 07/05/20 0001      Ambulation/Gait   Gait Comments 2 laps around gym working on gait quality      Knee/Hip Exercises: Standing   Heel Raises Both;20 reps    Hip Abduction Both;15 reps    Abduction Limitations form and postural cues    Hip Extension Both;15 reps    Extension Limitations postural cues    SLS 5X max of 7"Rt, 5" Lt    SLS with Vectors 10X5" each  with 1 HHA    Gait Training see above; 2RT                    PT Short Term Goals - 06/14/20 1132      PT SHORT TERM GOAL #1   Title Patient will report at least 50% improvement in overall symptoms and/or function to demonstrate improved functional mobility    Time 3    Period Weeks    Status New    Target Date 07/05/20      PT SHORT TERM GOAL #2   Title Patient will be independent in self management strategies to improve quality of life and functional outcomes.    Time 3    Period Weeks    Status New    Target Date 07/05/20      PT SHORT TERM GOAL #3   Title Patient will be able to perform 2 minute walk test without shortness of breath to improve functional endurance.    Time 3    Period Weeks    Status New    Target Date 07/05/20             PT Long Term Goals - 06/14/20 1132       PT LONG TERM GOAL #1   Title Patient will report at least 75% improvement in overall symptoms and/or function to demonstrate improved functional mobility    Time 6    Period Weeks    Status New    Target Date 07/26/20      PT LONG TERM GOAL #2   Title Patient will be able to stand for at least 15 seconds on either leg in single leg stance to improve static balance    Time 6    Period Weeks    Status New    Target Date 07/26/20      PT LONG TERM GOAL #3   Title Patient will be able to ambulate with fair posture (not dragging feet and reduced slumped posture) for entire 2 minutes during 2 minute walk test to improve functional mobility and posture.    Time 6    Period Weeks    Status New    Target Date 07/26/20                 Plan - 07/05/20 1544    Clinical Impression Statement Pt returns after 2 weeks due to inclement weather.  Reports compliance with HEP.  Began with ambulation noting minimal fatigue, however continues to walk with forward posturing, fast choppy steps with foot slap rather than heel to toe.  Cues to slow down ambulation.  Added hip abduction strengthening in standing as well as lateral step ups.  Static balance challenge added with max of 7" on Rt and 5" on Lt without UE assist.  Added vector activity to improve LE stabilization and improve static balance.  Pt only required 2 short seated rest breaks during session today.  Overall improving.    Personal Factors and Comorbidities Comorbidity 1;Comorbidity 2    Comorbidities MS, hx of COVID and post COVID symptoms    Examination-Activity Limitations Locomotion Level;Transfers;Stand;Stairs;Squat    Examination-Participation Restrictions Community Activity;Cleaning    Stability/Clinical Decision Making Stable/Uncomplicated    Rehab Potential Good    PT Frequency 2x / week    PT Duration 6 weeks    PT Treatment/Interventions ADLs/Self Care Home Management;Aquatic Therapy;Cryotherapy;Electrical Stimulation;Moist  Heat;Traction;Balance training;Therapeutic exercise;Therapeutic activities;Functional mobility training;Stair training;Gait training;Neuromuscular re-education;Patient/family education;Manual techniques;Dry needling;Joint Manipulations  PT Next Visit Plan Next session add hip flexor and slant board stretches, continue gluteal strengthening.  focus on balance, endurance,walking, posture and LE strengthening    PT Home Exercise Plan long exhale breath work; 1/11: STS, bridge, extension and heel/toe           Patient will benefit from skilled therapeutic intervention in order to improve the following deficits and impairments:  Pain,Abnormal gait,Decreased balance,Decreased mobility,Decreased range of motion,Decreased activity tolerance,Decreased endurance,Postural dysfunction  Visit Diagnosis: Difficulty in walking, not elsewhere classified  Muscle weakness (generalized)     Problem List Patient Active Problem List   Diagnosis Date Noted  . Acute hypoxemic respiratory failure due to COVID-19 (HCC) 02/06/2020  . Pneumonia due to COVID-19 virus 02/04/2020  . Elevated transaminase level 02/04/2020  . Patellofemoral arthritis of right knee 07/14/2019  . History of DVT in adulthood 06/23/2019  . Right leg pain 03/12/2017  . Gait abnormality 03/12/2017  . Radiculopathy due to lumbar intervertebral disc disorder 09/21/2016  . Spondylosis without myelopathy or radiculopathy, lumbar region 09/21/2016  . History of anxiety 09/08/2016  . Primary osteoarthritis of right knee 08/30/2016  . Primary insomnia 08/30/2016  . Myofascial pain 08/30/2016  . Sciatica, right side 12/12/2015  . Right knee pain 12/12/2015  . Morbid obesity (HCC) 08/12/2015  . Lumbago 02/10/2014  . Chronic anxiety 07/08/2013  . Swelling of breast 04/08/2013  . Chronic pain syndrome 03/11/2013  . Port catheter in place 12/31/2012  . Unspecified constipation 10/01/2012  . Anemia 10/01/2012  . Rectal bleeding  10/01/2012  . Elevated alkaline phosphatase level 10/01/2012  . MS (multiple sclerosis) (HCC) 09/02/2012  . DVT (deep venous thrombosis) (HCC) 09/02/2012  . ANEMIA 04/12/2010  . OTHER DYSPHAGIA 04/12/2010   Lurena Nida, PTA/CLT [phone]  Lurena Nida 07/05/2020, 3:45 PM  [facility] [address] [place], [place], [address] Phone: [phone]   Fax:  [phone]  Name: Melinda Moore MRN: [id number] Date of Birth: 12/27/1970

## 2020-07-06 ENCOUNTER — Ambulatory Visit (HOSPITAL_COMMUNITY): Payer: 59 | Admitting: Physical Therapy

## 2020-07-07 ENCOUNTER — Other Ambulatory Visit: Payer: Self-pay | Admitting: Family Medicine

## 2020-07-08 ENCOUNTER — Other Ambulatory Visit: Payer: Self-pay | Admitting: *Deleted

## 2020-07-08 ENCOUNTER — Telehealth: Payer: Self-pay | Admitting: *Deleted

## 2020-07-08 MED ORDER — LINACLOTIDE 145 MCG PO CAPS: 145.0000 ug | ORAL_CAPSULE | Freq: Every day | ORAL | 5 refills | Status: DC

## 2020-07-08 NOTE — Telephone Encounter (Signed)
[facility] called and requested refill on linzess 145 one daily for pt. Not on med list. Pharm said dr Lorin Picket last sent in refill in august 2021

## 2020-07-08 NOTE — Telephone Encounter (Signed)
Please verify with the patient is she currently taking this?  If she is she may have 30-day with 5 refills

## 2020-07-08 NOTE — Telephone Encounter (Signed)
Pt states she is taking med and I sent in refills to pharm

## 2020-07-11 ENCOUNTER — Ambulatory Visit: Payer: Medicare Other | Admitting: Family Medicine

## 2020-07-12 ENCOUNTER — Ambulatory Visit (HOSPITAL_COMMUNITY): Payer: 59 | Attending: Neurology

## 2020-07-12 ENCOUNTER — Other Ambulatory Visit: Payer: Self-pay

## 2020-07-12 ENCOUNTER — Encounter (HOSPITAL_COMMUNITY): Payer: Self-pay

## 2020-07-12 DIAGNOSIS — M6281 Muscle weakness (generalized): Secondary | ICD-10-CM | POA: Insufficient documentation

## 2020-07-12 DIAGNOSIS — R262 Difficulty in walking, not elsewhere classified: Secondary | ICD-10-CM | POA: Diagnosis not present

## 2020-07-12 NOTE — Therapy (Addendum)
[facility] [address] [place], [place], [address] Phone: [phone]   Fax:  [phone]  Physical Therapy Treatment and Discharge Note  Patient Details  Name: Melinda Moore MRN: [id number] Date of Birth: 10-18-1970 Referring Provider (PT): Ala Bent, MD   PHYSICAL THERAPY DISCHARGE SUMMARY  Visits from Start of Care: 4  Current functional level related to goals / functional outcomes: Unable to assess due to unplanned discharge   Remaining deficits: Unable to assess due to unplanned discharge   Education / Equipment: Unable to assess due to unplanned discharge Plan: Patient agrees to discharge.  Patient goals were not met. Patient is being discharged due to not returning since the last visit.  ?????    7:28 AM, 08/05/20 Jerene Pitch, DPT Physical Therapy with [facility]  [phone] office      Encounter Date: 07/12/2020   PT End of Session - 07/12/20 1141    Visit Number 4    Number of Visits 12    Date for PT Re-Evaluation 07/26/20    Authorization Type united [employer]- 60 VL between PT/OT/SP 0 used, secondary medicar - no VL no auth required    Authorization - Visit Number 4    Authorization - Number of Visits 60    Progress Note Due on Visit 10    PT Start Time 3016    PT Stop Time 1132    PT Time Calculation (min) 45 min    Activity Tolerance Patient tolerated treatment well   Reports fatigue level at 3-4/10 initially, 2/10 at EOS   Behavior During Therapy [facility] for tasks assessed/performed           Past Medical History:  Diagnosis Date  . Anemia   . Anxiety   . Bell's palsy   . Depression   . DVT (deep venous thrombosis) ([place]) 09/02/2012   [place] on 02/13/2012 showed acute DVT in left calf veins and posterior tibial veins  . Fibromyalgia   . GERD (gastroesophageal reflux disease)   . History of blood transfusion 2000  . History of trigeminal neuralgia   . HTN (hypertension)     patient denies was taking a blood pressure medication in early 2000 but it was migraines  . Leukopenia   . MS (multiple sclerosis) ([place]) 1997  . Neuropathic pain   . Peripheral edema   . Port catheter in place 12/31/2012  . Right bundle branch block     Past Surgical History:  Procedure Laterality Date  . ABDOMINAL HYSTERECTOMY    . CHOLECYSTECTOMY    . ESOPHAGOGASTRODUODENOSCOPY  2011   Dr. Gala Romney. Possible cervical esophageal with status post disruption with passage of Maloney dilator, glycol esophageal erythema, antral erosions. Biopsy from the stomach revealed minimal chronic inactive inflammation but negative for H. pylori  . LAPAROSCOPIC GASTRIC SLEEVE RESECTION N/A 04/17/2016   Procedure: LAPAROSCOPIC GASTRIC SLEEVE RESECTION WITH UPPER ENDOSCOPY;  Surgeon: Excell Seltzer, MD;  Location: [facility];  Service: General;  Laterality: N/A;  . PATELLA-FEMORAL ARTHROPLASTY Right 07/14/2019   Procedure: PATELLA-FEMORAL ARTHROPLASTY;  Surgeon: Marchia Bond, MD;  Location: [facility];  Service: Orthopedics;  Laterality: Right;  . PORTACATH PLACEMENT    . TUBAL LIGATION  2001    There were no vitals filed for this visit.   Subjective Assessment - 07/12/20 1050    Subjective Pt stated she is doing well today, back is stiff today.    Pertinent History MS COVID March 2021    Patient  Stated Goals to have better stability with her legs, want better posture with walking    Currently in Pain? No/denies   Fatigue level 3/10 beginning of session                            [facility] [place] [facility] PT Treatment/Exercise - 07/12/20 0001      Ambulation/Gait   Gait Comments 2 laps around gym working on gait quality; cueing to slow down and improve heel to toe mechanics      Exercises   Exercises Knee/Hip      Knee/Hip Exercises: Stretches   Hip Flexor Stretch 10 seconds   forward lunge on 8in step, dynamic warm up for hip flexor   Hip Flexor Stretch Limitations forward lunge on 8in step,  dynamic warm up for hip flexor    Gastroc Stretch 30 seconds;3 reps    Gastroc Stretch Limitations slant board    Other Knee/Hip Stretches standing extension      Knee/Hip Exercises: Standing   Heel Raises Both;20 reps    Heel Raises Limitations incline slope    Forward Lunges 10 reps;5 seconds    Forward Lunges Limitations dynamic hip flexor warm up for flexibility on 8in step    Hip Abduction Both;15 reps    Abduction Limitations form and postural cues    Hip Extension Both;15 reps    Extension Limitations postural cues    Rocker Board 1 minute    Rocker Board Limitations DF/PF and lateral 1 min each with UE support    Gait Training inside //bars slow mechanics heel to toe 3x    Other Standing Knee Exercises wall arch 10x      Knee/Hip Exercises: Seated   Other Seated Knee/Hip Exercises toe raises during seated rest break    Sit to Sand 10 reps;without UE support   eccentric control     Knee/Hip Exercises: Supine   Bridges 2 sets;5 reps    Bridges Limitations cueing for eccentic control                    PT Short Term Goals - 06/14/20 1132      PT SHORT TERM GOAL #1   Title Patient will report at least 50% improvement in overall symptoms and/or function to demonstrate improved functional mobility    Time 3    Period Weeks    Status New    Target Date 07/05/20      PT SHORT TERM GOAL #2   Title Patient will be independent in self management strategies to improve quality of life and functional outcomes.    Time 3    Period Weeks    Status New    Target Date 07/05/20      PT SHORT TERM GOAL #3   Title Patient will be able to perform 2 minute walk test without shortness of breath to improve functional endurance.    Time 3    Period Weeks    Status New    Target Date 07/05/20             PT Long Term Goals - 06/14/20 1132      PT LONG TERM GOAL #1   Title Patient will report at least 75% improvement in overall symptoms and/or function to demonstrate  improved functional mobility    Time 6    Period Weeks    Status New    Target Date 07/26/20  PT LONG TERM GOAL #2   Title Patient will be able to stand for at least 15 seconds on either leg in single leg stance to improve static balance    Time 6    Period Weeks    Status New    Target Date 07/26/20      PT LONG TERM GOAL #3   Title Patient will be able to ambulate with fair posture (not dragging feet and reduced slumped posture) for entire 2 minutes during 2 minute walk test to improve functional mobility and posture.    Time 6    Period Weeks    Status New    Target Date 07/26/20                 Plan - 07/12/20 1142    Clinical Impression Statement Session focus on improving gait mechanics and LE strengthening.  Pt presents wtih forward flexed posture and decreased ankle mobility noted during gait.  Added dynamic hip flexor warm up with forward lunges, rockerboard for weight distribution and DF/PF, heel to toe gait training and wall arch to improve lumber extension.  Pt required periodic short duration seated rest breaks for fatigue, no reports of SOB during session.  Pt tolerated well to session with reoprts of fatigue level at 2/10 EOS, reports decreased stiffness and improved posture at EOS.  Encouraged pt to adjust mirrors in car prior leaving parking lot to continue good seated posture.  Will continue to require cueing to slow down and improve heel to toe mechanics iwht gait.    Personal Factors and Comorbidities Comorbidity 1;Comorbidity 2    Comorbidities MS, hx of COVID and post COVID symptoms    Examination-Activity Limitations Locomotion Level;Transfers;Stand;Stairs;Squat    Examination-Participation Restrictions Community Activity;Cleaning    Stability/Clinical Decision Making Stable/Uncomplicated    Clinical Decision Making Low    Rehab Potential Good    PT Frequency 2x / week    PT Duration 6 weeks    PT Treatment/Interventions ADLs/Self Care Home  Management;Aquatic Therapy;Cryotherapy;Electrical Stimulation;Moist Heat;Traction;Balance training;Therapeutic exercise;Therapeutic activities;Functional mobility training;Stair training;Gait training;Neuromuscular re-education;Patient/family education;Manual techniques;Dry needling;Joint Manipulations    PT Next Visit Plan Next sessoin focus on slow cadence and heel to toe mechanics.  Possible ankle mobility exercise to improve dorsiflexion.  Continue gluteal/LE strengthenig, balance, endurance, gait and posture.    PT Home Exercise Plan long exhale breath work; 1/11: STS, bridge, extension and heel/toe           Patient will benefit from skilled therapeutic intervention in order to improve the following deficits and impairments:  Pain,Abnormal gait,Decreased balance,Decreased mobility,Decreased range of motion,Decreased activity tolerance,Decreased endurance,Postural dysfunction  Visit Diagnosis: Muscle weakness (generalized)  Difficulty in walking, not elsewhere classified     Problem List Patient Active Problem List   Diagnosis Date Noted  . Acute hypoxemic respiratory failure due to COVID-19 ([place]) 02/06/2020  . Pneumonia due to COVID-19 virus 02/04/2020  . Elevated transaminase level 02/04/2020  . Patellofemoral arthritis of right knee 07/14/2019  . History of DVT in adulthood 06/23/2019  . Right leg pain 03/12/2017  . Gait abnormality 03/12/2017  . Radiculopathy due to lumbar intervertebral disc disorder 09/21/2016  . Spondylosis without myelopathy or radiculopathy, lumbar region 09/21/2016  . History of anxiety 09/08/2016  . Primary osteoarthritis of right knee 08/30/2016  . Primary insomnia 08/30/2016  . Myofascial pain 08/30/2016  . Sciatica, right side 12/12/2015  . Right knee pain 12/12/2015  . Morbid obesity ([place]) 08/12/2015  . Lumbago 02/10/2014  .  Chronic anxiety 07/08/2013  . Swelling of breast 04/08/2013  . Chronic pain syndrome 03/11/2013  . Port catheter in  place 12/31/2012  . Unspecified constipation 10/01/2012  . Anemia 10/01/2012  . Rectal bleeding 10/01/2012  . Elevated alkaline phosphatase level 10/01/2012  . MS (multiple sclerosis) ([place]) 09/02/2012  . DVT (deep venous thrombosis) ([place]) 09/02/2012  . ANEMIA 04/12/2010  . OTHER DYSPHAGIA 04/12/2010   Ihor Austin, LPTA/CLT; CBIS [phone]  Aldona Lento 07/12/2020, 11:51 AM  [facility] [address] [place], [place], [address] Phone: [phone]   Fax:  [phone]  Name: Melinda Moore MRN: [id number] Date of Birth: 10-Jul-1970

## 2020-07-14 ENCOUNTER — Ambulatory Visit (HOSPITAL_COMMUNITY): Payer: 59

## 2020-07-19 ENCOUNTER — Ambulatory Visit (HOSPITAL_COMMUNITY): Payer: 59 | Admitting: Physical Therapy

## 2020-07-19 ENCOUNTER — Telehealth (HOSPITAL_COMMUNITY): Payer: Self-pay | Admitting: Physical Therapy

## 2020-07-19 NOTE — Telephone Encounter (Signed)
pt lmonvm to cx this appt.

## 2020-07-21 ENCOUNTER — Ambulatory Visit (HOSPITAL_COMMUNITY): Payer: 59 | Admitting: Physical Therapy

## 2020-07-26 ENCOUNTER — Telehealth (HOSPITAL_COMMUNITY): Payer: Self-pay | Admitting: Physical Therapy

## 2020-07-26 ENCOUNTER — Ambulatory Visit (HOSPITAL_COMMUNITY): Payer: 59 | Admitting: Physical Therapy

## 2020-07-26 NOTE — Telephone Encounter (Signed)
pt has a pulmonology appt today and needed to cx today's appt.

## 2020-07-27 ENCOUNTER — Encounter: Payer: Self-pay | Admitting: Pulmonary Disease

## 2020-07-27 ENCOUNTER — Other Ambulatory Visit: Payer: Self-pay

## 2020-07-27 ENCOUNTER — Ambulatory Visit (INDEPENDENT_AMBULATORY_CARE_PROVIDER_SITE_OTHER): Payer: 59 | Admitting: Pulmonary Disease

## 2020-07-27 DIAGNOSIS — J1282 Pneumonia due to coronavirus disease 2019: Secondary | ICD-10-CM

## 2020-07-27 DIAGNOSIS — J9601 Acute respiratory failure with hypoxia: Secondary | ICD-10-CM

## 2020-07-27 DIAGNOSIS — U071 Acute respiratory failure with hypoxia: Secondary | ICD-10-CM

## 2020-07-27 NOTE — Assessment & Plan Note (Signed)
Resolved, oxygen discontinued December

## 2020-07-27 NOTE — Assessment & Plan Note (Signed)
Infiltrates have cleared on chest x-ray, hypoxia has resolved. No more testing required

## 2020-07-27 NOTE — Patient Instructions (Signed)
No more testing

## 2020-07-27 NOTE — Progress Notes (Signed)
   Subjective:    Patient ID: Melinda Moore, female    DOB: 1970/12/06, 50 y.o.   MRN: [id number]  HPI  50 year old never smoker  for  FU of shortness of breath and  hypoxia post Covid 01/2020.  She tested +8/23 and was hospitalized from 8/26-9/6 with hypoxia initially requiring 5 L but as high as 10 L during hospital admission.  CT imaging initially showed pneumomediastinum which resolved on follow-up CT, esophagram showed no leak but significant reflux up to clavicles  PMH -multiple sclerosis -diagnosed 1997, right-sided weakness, right Bell's palsy, numbness tingling both feet , on Neurontin and Ritalin, -depression on Wellbutrin and Zoloft -Hiatal hernia with reflux -History of DVT, "had antibody"?  Anticardiolipin IgG positive , did not need long-term anticoagulation -Chronic pain right knee, Dilaudid being tapered by PCP  Chief Complaint  Patient presents with  . Follow-up    Productive cough with cloudy colored phlegm, Shortness of breath with a lot of activity   She was discharged on 3 L oxygen  >> Last OV , no sig desatn  Ambulatory saturation dropped from 100 to 96%, heart rate increased from 10 5-1 37 indicating significant deconditioning  Oxygen was discontinued in December and she has done well since then.  Dyspnea is improving.  She only uses albuterol MDI about 4 times a week and occasionally Combivent.  Still has cough with clear sputum. She is now vaccinated & even boosted!  Significant tests/ events reviewed  8/27 CT angiogram chest -bilateral infiltrates, pneumomediastinum 9/2 CT chest without contrast moderate hiatal hernia, increased bilateral infiltrates, resolved pneumomediastinum  8/27 esophagram-no leak , significant reflux up to clavicles  02/11/2020 venous duplex BLE >> neg  Review of Systems neg for any significant sore throat, dysphagia, itching, sneezing, nasal congestion or excess/ purulent secretions, fever, chills, sweats, unintended wt loss,  pleuritic or exertional cp, hempoptysis, orthopnea pnd or change in chronic leg swelling. Also denies presyncope, palpitations, heartburn, abdominal pain, nausea, vomiting, diarrhea or change in bowel or urinary habits, dysuria,hematuria, rash, arthralgias, visual complaints, headache, numbness weakness or ataxia.     Objective:   Physical Exam  Gen. Pleasant, well-nourished, in no distress ENT - no thrush, no pallor/icterus,no post nasal drip Neck: No JVD, no thyromegaly, no carotid bruits Lungs: no use of accessory muscles, no dullness to percussion, clear without rales or rhonchi  Cardiovascular: Rhythm regular, heart sounds  normal, no murmurs or gallops, no peripheral edema Musculoskeletal: No deformities, no cyanosis or clubbing        Assessment & Plan:   Acute hypoxemic respiratory failure due to COVID-19 [facility]) Resolved, oxygen discontinued December  Pneumonia due to COVID-19 virus Infiltrates have cleared on chest x-ray, hypoxia has resolved. No more testing required

## 2020-07-28 ENCOUNTER — Telehealth (HOSPITAL_COMMUNITY): Payer: Self-pay | Admitting: Physical Therapy

## 2020-07-28 ENCOUNTER — Ambulatory Visit (HOSPITAL_COMMUNITY): Payer: 59 | Admitting: Physical Therapy

## 2020-07-28 NOTE — Telephone Encounter (Signed)
pt called to cx this appt due to her stuffy nose.

## 2020-08-03 ENCOUNTER — Ambulatory Visit (HOSPITAL_COMMUNITY): Payer: 59 | Admitting: Physical Therapy

## 2020-08-03 ENCOUNTER — Telehealth (HOSPITAL_COMMUNITY): Payer: Self-pay | Admitting: Physical Therapy

## 2020-08-03 NOTE — Telephone Encounter (Signed)
pt called to cx this appt due to she is not feeling well 

## 2020-08-04 ENCOUNTER — Telehealth (HOSPITAL_COMMUNITY): Payer: Self-pay | Admitting: Physical Therapy

## 2020-08-04 ENCOUNTER — Ambulatory Visit (HOSPITAL_COMMUNITY): Payer: 59 | Admitting: Physical Therapy

## 2020-08-04 NOTE — Telephone Encounter (Signed)
Pt did not show for scheduled appt today.  Pt has cancelled her last 4 appointments and today's NS was her last scheduled appointment.  Called and spoke to patient who agrees to discharge at this time.  Explained she would need a new order to return to therapy as her last appointment was on 07/12/20.  Pt verbalized understanding.  Lurena Nida, PTA/CLT [phone]

## 2020-08-10 ENCOUNTER — Ambulatory Visit: Payer: Medicare Other | Admitting: Family Medicine

## 2020-08-15 ENCOUNTER — Ambulatory Visit (INDEPENDENT_AMBULATORY_CARE_PROVIDER_SITE_OTHER): Payer: 59 | Admitting: Family Medicine

## 2020-08-15 ENCOUNTER — Other Ambulatory Visit: Payer: Self-pay

## 2020-08-15 VITALS — BP 138/82 | Temp 97.0°F | Ht 69.0 in | Wt 253.6 lb

## 2020-08-15 DIAGNOSIS — E785 Hyperlipidemia, unspecified: Secondary | ICD-10-CM | POA: Diagnosis not present

## 2020-08-15 DIAGNOSIS — D649 Anemia, unspecified: Secondary | ICD-10-CM

## 2020-08-15 DIAGNOSIS — G35 Multiple sclerosis: Secondary | ICD-10-CM

## 2020-08-15 DIAGNOSIS — R600 Localized edema: Secondary | ICD-10-CM | POA: Diagnosis not present

## 2020-08-15 DIAGNOSIS — M5431 Sciatica, right side: Secondary | ICD-10-CM

## 2020-08-15 DIAGNOSIS — M25561 Pain in right knee: Secondary | ICD-10-CM | POA: Diagnosis not present

## 2020-08-15 DIAGNOSIS — Z79891 Long term (current) use of opiate analgesic: Secondary | ICD-10-CM | POA: Diagnosis not present

## 2020-08-15 MED ORDER — HYDROMORPHONE HCL 4 MG PO TABS
ORAL_TABLET | ORAL | 0 refills | Status: DC
Start: 2020-08-15 — End: 2020-08-25

## 2020-08-15 MED ORDER — BUPROPION HCL ER (SR) 150 MG PO TB12: ORAL_TABLET | ORAL | 5 refills | Status: DC

## 2020-08-15 MED ORDER — HYDROMORPHONE HCL 4 MG PO TABS: ORAL_TABLET | ORAL | 0 refills | Status: DC

## 2020-08-15 MED ORDER — TORSEMIDE 20 MG PO TABS: ORAL_TABLET | ORAL | 5 refills | Status: DC

## 2020-08-15 MED ORDER — POTASSIUM CHLORIDE CRYS ER 20 MEQ PO TBCR: 20.0000 meq | EXTENDED_RELEASE_TABLET | Freq: Every day | ORAL | 5 refills | Status: DC

## 2020-08-15 NOTE — Progress Notes (Signed)
Subjective:    Patient ID: Melinda Moore, female    DOB: Jan 14, 1971, 50 y.o.   MRN: [id number]  HPI  This patient was seen today for chronic pain She takes the pain medication for combination of pain related to her low back, chronic right knee pain, pain from MS.  I have encouraged her to try to utilize his least amount as possible.  Some days she will able to take 2-1/2 tablets a day rather than 3.  She relates compliance. The medication list was reviewed and updated.   -Compliance with medication: Dilaudid 4mg   - Number patient states they take daily: 3  -when was the last dose patient took? today  The patient was advised the importance of maintaining medication and not using illegal substances with these.  Here for refills and follow up  The patient was educated that we can provide 3 monthly scripts for their medication, it is their responsibility to follow the instructions.  Side effects or complications from medications: none  Patient is aware that pain medications are meant to minimize the severity of the pain to allow their pain levels to improve to allow for better function. They are aware of that pain medications cannot totally remove their pain.  Due for UDT ( at least once per year) :last completed 01/25/20  Having right leg pain near knee Also lumbar back pain  Will see Dr 01/27/20 soon Also sciatica in right leg Also stiff and soreness Tries to get out  Has a lot of MS pain  Patient does state that the pain medicine makes her pain more tolerable allows her to function without having major setbacks.  She states without her pain medicine she would not be able to function and tolerate doing things or doing housework    Review of Systems  Constitutional: Negative for activity change, fatigue and fever.  HENT: Negative for congestion and rhinorrhea.   Respiratory: Negative for cough, chest tightness and shortness of breath.   Cardiovascular: Negative for chest pain and  leg swelling.  Gastrointestinal: Negative for abdominal pain and nausea.  Musculoskeletal: Positive for arthralgias, back pain and myalgias.  Skin: Negative for color change.  Neurological: Negative for dizziness and headaches.  Psychiatric/Behavioral: Negative for agitation and behavioral problems.       Objective:   Physical Exam Vitals reviewed.  Constitutional:      General: She is not in acute distress.    Appearance: She is well-nourished.  HENT:     Head: Normocephalic.  Cardiovascular:     Rate and Rhythm: Normal rate and regular rhythm.     Heart sounds: Normal heart sounds. No murmur heard.   Pulmonary:     Effort: Pulmonary effort is normal.     Breath sounds: Normal breath sounds.  Musculoskeletal:        General: No edema.  Lymphadenopathy:     Cervical: No cervical adenopathy.  Neurological:     Mental Status: She is alert.  Psychiatric:        Behavior: Behavior normal.     Patient is concerned about her morbid obesity.  We did discuss dietary restrictions seeing a dietitian she was wondering if there was potential for medication to help her lose weight but I do not recommend for her to be on phenteramine given that she is already on Dilaudid      Assessment & Plan:  1. Encounter for long-term opiate analgesic use The patient was seen in followup for chronic pain.  A review over at their current pain status was discussed. Drug registry was checked. Prescriptions were given.  Regular follow-up recommended. Discussion was held regarding the importance of compliance with medication as well as pain medication contract.  Patient was informed that medication may cause drowsiness and should not be combined  with other medications/alcohol or street drugs. If the patient feels medication is causing altered alertness then do not drive or operate dangerous equipment.  Drug registry checked.  3 scripts given.  2. MS (multiple sclerosis) ([facility]) She is followed by  her neurology specialist for this.  They have her on gabapentin maximum dose she states she is tolerating the medicine well and does not cause drowsiness  3. Right knee pain, unspecified chronicity Significant right knee pain she has seen her orthopedist who did the surgery they told her there is nothing else they can do at this time.  Continue pain medicine  4. Pedal edema Trace pedal edema she is on diuretic takes 1 tablet a day also takes potassium - Comprehensive metabolic panel  5. Sciatica, right side Has right leg sciatica will be seeing Dr. Murray Hodgkins in the near future for potential injections  6. Anemia, normocytic normochromic Has a history of anemia of chronic disease related to her multiple underlying health problems we will check CBC - CBC with Differential/Platelet  7. Hyperlipidemia, unspecified hyperlipidemia type History hyperlipidemia check lipid profile await results - CBC with Differential/Platelet - Comprehensive metabolic panel - Lipid panel  Morbid obesity portion control activity has had lap band I do not recommend additional medications for weight loss at this time portion control regular activity is recommended

## 2020-08-16 ENCOUNTER — Ambulatory Visit: Payer: Medicare Other | Admitting: Family Medicine

## 2020-08-20 ENCOUNTER — Encounter: Payer: Self-pay | Admitting: Family Medicine

## 2020-08-25 ENCOUNTER — Other Ambulatory Visit: Payer: Self-pay | Admitting: Family Medicine

## 2020-08-25 ENCOUNTER — Encounter: Payer: Self-pay | Admitting: Family Medicine

## 2020-08-25 DIAGNOSIS — Z79891 Long term (current) use of opiate analgesic: Secondary | ICD-10-CM

## 2020-08-25 MED ORDER — HYDROMORPHONE HCL 4 MG PO TABS: ORAL_TABLET | ORAL | 0 refills | Status: DC

## 2020-09-07 DIAGNOSIS — M48062 Spinal stenosis, lumbar region with neurogenic claudication: Secondary | ICD-10-CM | POA: Diagnosis not present

## 2020-09-07 DIAGNOSIS — M5416 Radiculopathy, lumbar region: Secondary | ICD-10-CM | POA: Diagnosis not present

## 2020-09-19 NOTE — Progress Notes (Deleted)
Subjective:   Melinda Moore is a 50 y.o. female who presents for an Initial Medicare Annual Wellness Visit.  Review of Systems    N/A        Objective:    There were no vitals filed for this visit. There is no height or weight on file to calculate BMI.  Advanced Directives 06/14/2020 02/07/2020 02/04/2020 07/14/2019 07/10/2019 06/13/2019 01/31/2019  Does Patient Have a Medical Advance Directive? No No No No No No No  Would patient like information on creating a medical advance directive? No - Patient declined No - Patient declined - No - Patient declined - - -    Current Medications (verified) Outpatient Encounter Medications as of 09/20/2020  Medication Sig  . albuterol (VENTOLIN HFA) 108 (90 Base) MCG/ACT inhaler Inhale 2 puffs into the lungs every 4 (four) hours as needed for wheezing or shortness of breath (cough, shortness of breath or wheezing.).  Marland Kitchen albuterol (VENTOLIN HFA) 108 (90 Base) MCG/ACT inhaler Inhale 2 puffs into the lungs every 6 (six) hours as needed for wheezing or shortness of breath.  Marland Kitchen ascorbic acid (VITAMIN C) 500 MG tablet Take 1 tablet (500 mg total) by mouth daily.  Marland Kitchen Bioflavonoid Products (BIOFLEX) TABS Take 2 tablets by mouth daily.  Marland Kitchen buPROPion (WELLBUTRIN SR) 150 MG 12 hr tablet One taken 3 times a day  . carbamazepine (TEGRETOL XR) 200 MG 12 hr tablet Take 1 tablet (200 mg total) by mouth 3 (three) times daily as needed.  . Cholecalciferol (VITAMIN D3) 125 MCG (5000 UT) TABS Take 20,000 Units by mouth 2 (two) times daily.  Marland Kitchen gabapentin (NEURONTIN) 300 MG capsule Take 1 capsule (300 mg total) by mouth 3 (three) times daily. (Patient taking differently: Take 1,200 mg by mouth 3 (three) times daily.)  . HYDROmorphone (DILAUDID) 4 MG tablet Take 1 tablet TID PRN  . HYDROmorphone (DILAUDID) 4 MG tablet Take 1 tablet TID PRN  . HYDROmorphone (DILAUDID) 4 MG tablet Take 1/2 to 1 tablet qid prn , maximum use per day is 3-1/2 tablets  . Ipratropium-Albuterol (COMBIVENT  RESPIMAT) 20-100 MCG/ACT AERS respimat Inhale 2 puffs 4 times daily prn  . linaclotide (LINZESS) 145 MCG CAPS capsule Take 1 capsule (145 mcg total) by mouth daily before breakfast.  . LUTEIN PO Take 1 tablet by mouth daily.  . Multiple Vitamin (MULTIVITAMIN WITH MINERALS) TABS tablet Take 1 tablet by mouth daily. ALIVE  . Multiple Vitamins-Minerals (EMERGEN-C IMMUNE) PACK Take 1 packet by mouth once a week.  . naloxone (NARCAN) nasal spray 4 mg/0.1 mL Use as directed if accidental overdose  . ondansetron (ZOFRAN-ODT) 8 MG disintegrating tablet Take 1 tablet (8 mg total) by mouth every 8 (eight) hours as needed for nausea or vomiting.  . pantoprazole (PROTONIX) 40 MG tablet TAKE ONE (1) TABLET BY MOUTH EVERY DAY  . Polyethylene Glycol 400 0.25 % SOLN Place 1 drop into the right eye 2 (two) times daily as needed (dry eyes).  . potassium chloride SA (KLOR-CON) 20 MEQ tablet Take 1 tablet (20 mEq total) by mouth daily.  . sertraline (ZOLOFT) 50 MG tablet TAKE ONE-HALF TABLET BY MOUTH TWICE A DAY  . torsemide (DEMADEX) 20 MG tablet Take 1-2 tablet po q am prn pedal edema  . zinc sulfate 220 (50 Zn) MG capsule Take 1 capsule (220 mg total) by mouth daily.   No facility-administered encounter medications on file as of 09/20/2020.    Allergies (verified) Ambien [zolpidem tartrate] and Tape  History: Past Medical History:  Diagnosis Date  . Anemia   . Anxiety   . Bell's palsy   . Depression   . DVT (deep venous thrombosis) (HCC) 09/02/2012   [place] on 02/13/2012 showed acute DVT in left calf veins and posterior tibial veins  . Fibromyalgia   . GERD (gastroesophageal reflux disease)   . History of blood transfusion 2000  . History of trigeminal neuralgia   . HTN (hypertension)    patient denies was taking a blood pressure medication in early 2000 but it was migraines  . Leukopenia   . MS (multiple sclerosis) (HCC) 1997  . Neuropathic pain   . Peripheral edema   . Port catheter in place  12/31/2012  . Right bundle branch block    Past Surgical History:  Procedure Laterality Date  . ABDOMINAL HYSTERECTOMY    . CHOLECYSTECTOMY    . ESOPHAGOGASTRODUODENOSCOPY  2011   Dr. Jena Gauss. Possible cervical esophageal with status post disruption with passage of Maloney dilator, glycol esophageal erythema, antral erosions. Biopsy from the stomach revealed minimal chronic inactive inflammation but negative for H. pylori  . LAPAROSCOPIC GASTRIC SLEEVE RESECTION N/A 04/17/2016   Procedure: LAPAROSCOPIC GASTRIC SLEEVE RESECTION WITH UPPER ENDOSCOPY;  Surgeon: Glenna Fellows, MD;  Location: [facility];  Service: General;  Laterality: N/A;  . PATELLA-FEMORAL ARTHROPLASTY Right 07/14/2019   Procedure: PATELLA-FEMORAL ARTHROPLASTY;  Surgeon: Teryl Lucy, MD;  Location: [facility];  Service: Orthopedics;  Laterality: Right;  . PORTACATH PLACEMENT    . TUBAL LIGATION  2001   Family History  Problem Relation Age of Onset  . Heart disease Father   . Hyperlipidemia Father   . Congestive Heart Failure Father   . Hypertension Sister   . Other Paternal Uncle        Possible colon cancer, age greater than 65  . Cirrhosis Brother        etoh  . Healthy Mother   . Healthy Daughter   . Healthy Daughter    Social History   Socioeconomic History  . Marital status: Married    Spouse name: Not on file  . Number of children: 2  . Years of education: some college  . Highest education level: Not on file  Occupational History  . Occupation: disabled  Tobacco Use  . Smoking status: Former Smoker    Packs/day: 0.25    Types: Cigarettes    Quit date: 1990    Years since quitting: 32.2  . Smokeless tobacco: Never Used  . Tobacco comment: 4 a day, quit in 1990  Vaping Use  . Vaping Use: Never used  Substance and Sexual Activity  . Alcohol use: No  . Drug use: No  . Sexual activity: Not on file  Other Topics Concern  . Not on file  Social History Narrative   Lives at home with husband and 2  daughters   Right handed   Takes 1/2 caffeine pill per day   Social Determinants of Health   Financial Resource Strain: Not on file  Food Insecurity: Not on file  Transportation Needs: Not on file  Physical Activity: Not on file  Stress: Not on file  Social Connections: Not on file    Tobacco Counseling Counseling given: Not Answered Comment: 4 a day, quit in 1990   Clinical Intake:                 Diabetic?No         Activities of Daily Living In your present  state of health, do you have any difficulty performing the following activities: 02/15/2020 02/07/2020  Hearing? - N  Vision? - N  Difficulty concentrating or making decisions? - N  Walking or climbing stairs? - Y  Dressing or bathing? - N  Doing errands, shopping? N N  Some recent data might be hidden    Patient Care Team: Babs Sciara, MD as PCP - General (Family Medicine) Jena Gauss Gerrit Friends, MD as Attending Physician (Gastroenterology) Imagene Gurney, MD (Inactive) as Referring Physician (Hematology)  Indicate any recent Medical Services you may have received from other than [facility] providers in the past year (date may be approximate).     Assessment:   This is a routine wellness examination for Doloris.  Hearing/Vision screen No exam data present  Dietary issues and exercise activities discussed:    Goals   None    Depression Screen PHQ 2/9 Scores 08/15/2020 06/29/2020 04/07/2020 06/17/2018 05/23/2018 09/23/2017 05/05/2016  PHQ - 2 Score 0 0 0 0 0 0 0  PHQ- 9 Score - 1 2 2 4  - -    Fall Risk Fall Risk  06/17/2018 05/23/2018 05/05/2016 04/16/2016 12/28/2015  Falls in the past year? 0 1 No No No  Number falls in past yr: 0 0 - - -  Injury with Fall? 0 0 - - -  Risk for fall due to : - - - - -  Risk for fall due to: Comment - - - - -  Follow up Falls evaluation completed Education provided - - -    FALL RISK PREVENTION PERTAINING TO THE HOME:  Any stairs in or around the home?  {YES/NO:21197} If so, are there any without handrails? No  Home free of loose throw rugs in walkways, pet beds, electrical cords, etc? Yes  Adequate lighting in your home to reduce risk of falls? Yes   ASSISTIVE DEVICES UTILIZED TO PREVENT FALLS:  Life alert? {YES/NO:21197} Use of a cane, walker or w/c? {YES/NO:21197} Grab bars in the bathroom? {YES/NO:21197} Shower chair or bench in shower? {YES/NO:21197} Elevated toilet seat or a handicapped toilet? {YES/NO:21197}    Cognitive Function:        Immunizations Immunization History  Administered Date(s) Administered  . Influenza Split 04/06/2013  . Influenza,inj,Quad PF,6+ Mos 05/24/2015, 03/16/2016, 03/25/2017, 03/17/2018  . Influenza-Unspecified 03/10/2012, 03/31/2014  . Moderna Sars-Covid-2 Vaccination 03/25/2020, 04/15/2020, 07/15/2020  . Pneumococcal-Unspecified 03/21/2006  . Tdap 04/18/2018    TDAP status: Up to date  {Flu Vaccine status:2101806}  Pneumococcal vaccine status: Up to date  Covid-19 vaccine status: Completed vaccines  Qualifies for Shingles Vaccine? Yes   Zostavax completed No   Shingrix Completed?: No.    Education has been provided regarding the importance of this vaccine. Patient has been advised to call insurance company to determine out of pocket expense if they have not yet received this vaccine. Advised may also receive vaccine at local pharmacy or Health Dept. Verbalized acceptance and understanding.  Screening Tests Health Maintenance  Topic Date Due  . Hepatitis C Screening  Never done  . COLONOSCOPY (Pts 45-39yrs Insurance coverage will need to be confirmed)  Never done  . INFLUENZA VACCINE  01/09/2021  . COVID-19 Vaccine (3 - Booster for Moderna series) 01/12/2021  . TETANUS/TDAP  04/18/2028  . HIV Screening  Completed  . HPV VACCINES  Aged Out    Health Maintenance  Health Maintenance Due  Topic Date Due  . Hepatitis C Screening  Never done  . COLONOSCOPY (Pts 45-57yrs  Insurance  coverage will need to be confirmed)  Never done    {Colorectal cancer screening:2101809}  Mammogram status: Ordered 09/20/2020. Pt provided with contact info and advised to call to schedule appt.   Bone Density Status: Not due until the age of 50  Lung Cancer Screening: (Low Dose CT Chest recommended if Age 87-80 years, 30 pack-year currently smoking OR have quit w/in 15years.) does not qualify.   Lung Cancer Screening Referral: N/A   Additional Screening:  Hepatitis C Screening: does qualify:  Vision Screening: Recommended annual ophthalmology exams for early detection of glaucoma and other disorders of the eye. Is the patient up to date with their annual eye exam?  {YES/NO:21197} Who is the provider or what is the name of the office in which the patient attends annual eye exams? *** If pt is not established with a provider, would they like to be referred to a provider to establish care? {YES/NO:21197}.   Dental Screening: Recommended annual dental exams for proper oral hygiene  Community Resource Referral / Chronic Care Management: CRR required this visit?  {YES/NO:21197}  CCM required this visit?  {YES/NO:21197}     Plan:     I have personally reviewed and noted the following in the patient's chart:   . Medical and social history . Use of alcohol, tobacco or illicit drugs  . Current medications and supplements . Functional ability and status . Nutritional status . Physical activity . Advanced directives . List of other physicians . Hospitalizations, surgeries, and ER visits in previous 12 months . Vitals . Screenings to include cognitive, depression, and falls . Referrals and appointments  In addition, I have reviewed and discussed with patient certain preventive protocols, quality metrics, and best practice recommendations. A written personalized care plan for preventive services as well as general preventive health recommendations were provided to  patient.     Theodora BlowShannon Elisabetta Mishra, LPN   0/98/11914/04/2021   Nurse Notes: ***

## 2020-09-20 ENCOUNTER — Ambulatory Visit: Payer: 59

## 2020-09-20 ENCOUNTER — Other Ambulatory Visit: Payer: Self-pay

## 2020-09-20 DIAGNOSIS — Z0000 Encounter for general adult medical examination without abnormal findings: Secondary | ICD-10-CM

## 2020-09-20 NOTE — Progress Notes (Signed)
Erroneous Encounter

## 2020-09-26 ENCOUNTER — Encounter: Payer: Self-pay | Admitting: Family Medicine

## 2020-09-26 ENCOUNTER — Other Ambulatory Visit: Payer: Self-pay | Admitting: Family Medicine

## 2020-09-26 ENCOUNTER — Telehealth: Payer: Self-pay | Admitting: Family Medicine

## 2020-09-26 DIAGNOSIS — Z79891 Long term (current) use of opiate analgesic: Secondary | ICD-10-CM

## 2020-09-26 MED ORDER — HYDROMORPHONE HCL 4 MG PO TABS: ORAL_TABLET | ORAL | 0 refills | Status: DC

## 2020-09-26 NOTE — Telephone Encounter (Signed)
Nurses I sent in prescription for hydromorphone to [facility] pharmacy.  The correct prescriptions for a quantity of 105  (I accidentally sent into prescriptions that stated 90 please have them disregard those and only filled the 105 quantity prescriptions thank you)

## 2020-09-27 NOTE — Telephone Encounter (Signed)
Spoke with pharmacist at [facility] and he is void the scripts for #90 as requested.

## 2020-11-04 ENCOUNTER — Encounter (HOSPITAL_COMMUNITY): Payer: Self-pay | Admitting: *Deleted

## 2020-11-15 ENCOUNTER — Ambulatory Visit: Payer: Medicare Other | Admitting: Family Medicine

## 2020-11-21 ENCOUNTER — Other Ambulatory Visit: Payer: Self-pay

## 2020-11-21 ENCOUNTER — Ambulatory Visit (INDEPENDENT_AMBULATORY_CARE_PROVIDER_SITE_OTHER): Payer: 59 | Admitting: Family Medicine

## 2020-11-21 VITALS — BP 112/76 | HR 91 | Temp 97.5°F | Ht 69.0 in | Wt 257.0 lb

## 2020-11-21 DIAGNOSIS — E785 Hyperlipidemia, unspecified: Secondary | ICD-10-CM

## 2020-11-21 DIAGNOSIS — R5383 Other fatigue: Secondary | ICD-10-CM | POA: Diagnosis not present

## 2020-11-21 DIAGNOSIS — Z79891 Long term (current) use of opiate analgesic: Secondary | ICD-10-CM | POA: Diagnosis not present

## 2020-11-21 MED ORDER — HYDROMORPHONE HCL 4 MG PO TABS: ORAL_TABLET | ORAL | 0 refills | Status: DC

## 2020-11-21 NOTE — Progress Notes (Signed)
[facility]  Subjective:    Patient ID: Melinda Moore, female    DOB: Mar 09, 1971, 50 y.o.   MRN: [id number]  HPI  This patient was seen today for chronic pain  The medication list was reviewed and updated.  Location of Pain for which the patient has been treated with regarding narcotics: R knee, nerve pain in feet  Onset of this pain: couple of years    -Compliance with medication: 1/2 tab q 4 hrs no more than 3 1/2 tab  - Number patient states they take daily: up to 3 1/2  -when was the last dose patient took? This am 8:00  The patient was advised the importance of maintaining medication and not using illegal substances with these.  Here for refills and follow up  The patient was educated that we can provide 3 monthly scripts for their medication, it is their responsibility to follow the instructions.  Side effects or complications from medications: none  Patient is aware that pain medications are meant to minimize the severity of the pain to allow their pain levels to improve to allow for better function. They are aware of that pain medications cannot totally remove their pain.  Due for UDT ( at least once per year) : 01/25/20  Scale of 1 to 10 ( 1 is least 10 is most) Your pain level without the medicine: 8 Your pain level with medication 3  Scale 1 to 10 ( 1-helps very little, 10 helps very well) How well does your pain medication reduce your pain so you can function better through out the day? It does well  Quality of the pain: achy   Persistence of the pain: all day every day  Modifying factors: PT exercise, elevating, resting  Patient relates a lot of burning in her legs and feet.  Fatigue tiredness.  Feels rundown.  Low energy. Encourage the patient to have a wellness exam and colonoscopy    Review of Systems     Objective:   Physical Exam Lungs are clear heart regular pulse normal BP good extremities no edema obesity is noted       Assessment & Plan:    Patient relates that she has been taking less of the gabapentin during the day because it was causing drowsiness but then she started taking 3 of the 300 mg at nighttime-I encouraged her to discuss this with her neurology specialist who prescribes this perhaps they can put her on 100 mg and take 2 of them 3 times a day for something similar to that to get more benefit with less drowsiness  I have encouraged her to try to reduce pain medicine over the course of the next couple months we will still maintain her prescriptions as it is in.  Currently she is taking a half a tablet every 3-4 hours he takes approximately 7/day for her neuropathy pain related to the MS as well as her right knee pain.  I encourage her to try to whittle this down to perhaps 6 or 5 1/2 tablets a day to see if she can could still keep her pain under decent control with less medicine  She will follow-up again in 3 months time  1. Encounter for long-term opiate analgesic use Please see above The patient was seen in followup for chronic pain. A review over at their current pain status was discussed. Drug registry was checked. Prescriptions were given.  Regular follow-up recommended. Discussion was held regarding the importance of compliance with medication  as well as pain medication contract.  Patient was informed that medication may cause drowsiness and should not be combined  with other medications/alcohol or street drugs. If the patient feels medication is causing altered alertness then do not drive or operate dangerous equipment.  - HYDROmorphone (DILAUDID) 4 MG tablet; Take 1/2 to 1 tablet qid prn , maximum use per day is 3-1/2 tablets  Dispense: 105 tablet; Refill: 0 - HYDROmorphone (DILAUDID) 4 MG tablet; Take 1 tablet TID PRN  Dispense: 105 tablet; Refill: 0 - HYDROmorphone (DILAUDID) 4 MG tablet; Take 1 tablet TID PRN  Dispense: 105 tablet; Refill: 0 - CBC with Differential/Platelet - TSH - Comprehensive metabolic  panel - Lipid panel  2. Hyperlipidemia, unspecified hyperlipidemia type Hyperlipidemia check lipid profile await results watch diet - HYDROmorphone (DILAUDID) 4 MG tablet; Take 1/2 to 1 tablet qid prn , maximum use per day is 3-1/2 tablets  Dispense: 105 tablet; Refill: 0 - HYDROmorphone (DILAUDID) 4 MG tablet; Take 1 tablet TID PRN  Dispense: 105 tablet; Refill: 0 - HYDROmorphone (DILAUDID) 4 MG tablet; Take 1 tablet TID PRN  Dispense: 105 tablet; Refill: 0 - CBC with Differential/Platelet - TSH - Comprehensive metabolic panel - Lipid panel  3. Other fatigue Moderate fatigue and tiredness probably related to her MS as well as deconditioning.  We will check lab work - HYDROmorphone (DILAUDID) 4 MG tablet; Take 1/2 to 1 tablet qid prn , maximum use per day is 3-1/2 tablets  Dispense: 105 tablet; Refill: 0 - HYDROmorphone (DILAUDID) 4 MG tablet; Take 1 tablet TID PRN  Dispense: 105 tablet; Refill: 0 - HYDROmorphone (DILAUDID) 4 MG tablet; Take 1 tablet TID PRN  Dispense: 105 tablet; Refill: 0 - CBC with Differential/Platelet - TSH - Comprehensive metabolic panel - Lipid panel

## 2020-11-22 ENCOUNTER — Telehealth: Payer: 59 | Admitting: *Deleted

## 2020-11-22 ENCOUNTER — Other Ambulatory Visit: Payer: Self-pay | Admitting: Family Medicine

## 2020-11-22 DIAGNOSIS — Z79891 Long term (current) use of opiate analgesic: Secondary | ICD-10-CM

## 2020-11-22 DIAGNOSIS — R5383 Other fatigue: Secondary | ICD-10-CM

## 2020-11-22 DIAGNOSIS — E785 Hyperlipidemia, unspecified: Secondary | ICD-10-CM

## 2020-11-22 MED ORDER — HYDROMORPHONE HCL 4 MG PO TABS: ORAL_TABLET | ORAL | 0 refills | Status: DC

## 2020-11-22 NOTE — Telephone Encounter (Signed)
Nurses FYI I sent in corrected prescriptions for July and August She may utilize 1/2 tablet to 1 tablet 4 times daily as needed maximum 3-1/2 tablets daily Each prescription was for 105 tablets I have coached her to try to reduce her pain medicine intake over the next few months This information just in case [facility] pharmacy calls for clarification Thanks-Dr. Lorin Picket

## 2020-11-22 NOTE — Telephone Encounter (Signed)
[facility] called to clarify August Dilaudid prescription.They stated they will cancel the current August prescription and await corrected prescription

## 2020-11-30 DIAGNOSIS — M5416 Radiculopathy, lumbar region: Secondary | ICD-10-CM | POA: Diagnosis not present

## 2020-11-30 DIAGNOSIS — M48062 Spinal stenosis, lumbar region with neurogenic claudication: Secondary | ICD-10-CM | POA: Diagnosis not present

## 2020-12-29 ENCOUNTER — Encounter: Payer: Self-pay | Admitting: Family Medicine

## 2020-12-29 ENCOUNTER — Other Ambulatory Visit: Payer: Self-pay | Admitting: Family Medicine

## 2020-12-30 NOTE — Telephone Encounter (Signed)
Patient stated she has nasal congestion and coughing up mucus. Patient also has some body aches and the sore throat has resolved. Patient is using Robitussin Honey. Oxygen level is normal currently Patient is interested in the medication for Covid

## 2021-01-23 ENCOUNTER — Encounter: Payer: Self-pay | Admitting: Family Medicine

## 2021-01-23 DIAGNOSIS — M5431 Sciatica, right side: Secondary | ICD-10-CM

## 2021-01-23 DIAGNOSIS — M79604 Pain in right leg: Secondary | ICD-10-CM

## 2021-01-23 NOTE — Telephone Encounter (Signed)
May have referral for physical therapy due to leg pain/sciatica

## 2021-01-23 NOTE — Addendum Note (Signed)
Addended by: Marlowe Shores on: 01/23/2021 01:53 PM   Modules accepted: Orders

## 2021-02-04 ENCOUNTER — Other Ambulatory Visit: Payer: Self-pay | Admitting: Family Medicine

## 2021-02-10 ENCOUNTER — Other Ambulatory Visit: Payer: Self-pay | Admitting: Family Medicine

## 2021-02-20 ENCOUNTER — Encounter (HOSPITAL_COMMUNITY): Payer: Self-pay | Admitting: Physical Therapy

## 2021-02-20 ENCOUNTER — Other Ambulatory Visit: Payer: Self-pay

## 2021-02-20 ENCOUNTER — Ambulatory Visit (HOSPITAL_COMMUNITY): Payer: 59 | Attending: Family Medicine | Admitting: Physical Therapy

## 2021-02-20 DIAGNOSIS — M25561 Pain in right knee: Secondary | ICD-10-CM | POA: Insufficient documentation

## 2021-02-20 DIAGNOSIS — M5441 Lumbago with sciatica, right side: Secondary | ICD-10-CM | POA: Diagnosis not present

## 2021-02-20 DIAGNOSIS — M6281 Muscle weakness (generalized): Secondary | ICD-10-CM | POA: Diagnosis not present

## 2021-02-20 DIAGNOSIS — G8929 Other chronic pain: Secondary | ICD-10-CM | POA: Diagnosis not present

## 2021-02-20 DIAGNOSIS — R262 Difficulty in walking, not elsewhere classified: Secondary | ICD-10-CM | POA: Diagnosis not present

## 2021-02-20 NOTE — Therapy (Signed)
[facility] [address] [place], [place], [address] Phone: [phone]   Fax:  [phone]  Physical Therapy Evaluation  Patient Details  Name: Melinda Moore MRN: [id number] Date of Birth: 15-Jun-1970 Referring Provider (PT): Lilyan Punt MD   Encounter Date: 02/20/2021   PT End of Session - 02/20/21 0843     Visit Number 1    Number of Visits 12    Date for PT Re-Evaluation 04/03/21    Authorization Type united healthcare  VL 60 ST/OT/PT - no auth, secondary [facility] medicare no VL or auth    Progress Note Due on Visit 10    PT Start Time [phone]   late to session   PT Stop Time 0908    PT Time Calculation (min) 25 min    Activity Tolerance Patient tolerated treatment well    Behavior During Therapy [facility] for tasks assessed/performed             Past Medical History:  Diagnosis Date   Anemia    Anxiety    Bell's palsy    Depression    DVT (deep venous thrombosis) (HCC) 09/02/2012   [place] on 02/13/2012 showed acute DVT in left calf veins and posterior tibial veins   Fibromyalgia    GERD (gastroesophageal reflux disease)    History of blood transfusion 2000   History of trigeminal neuralgia    HTN (hypertension)    patient denies was taking a blood pressure medication in early 2000 but it was migraines   Leukopenia    MS (multiple sclerosis) (HCC) 1997   Neuropathic pain    Peripheral edema    Port catheter in place 12/31/2012   Right bundle branch block     Past Surgical History:  Procedure Laterality Date   ABDOMINAL HYSTERECTOMY     CHOLECYSTECTOMY     ESOPHAGOGASTRODUODENOSCOPY  2011   Dr. Jena Gauss. Possible cervical esophageal with status post disruption with passage of Maloney dilator, glycol esophageal erythema, antral erosions. Biopsy from the stomach revealed minimal chronic inactive inflammation but negative for H. pylori   LAPAROSCOPIC GASTRIC SLEEVE RESECTION N/A 04/17/2016   Procedure: LAPAROSCOPIC GASTRIC SLEEVE RESECTION  WITH UPPER ENDOSCOPY;  Surgeon: Glenna Fellows, MD;  Location: [facility];  Service: General;  Laterality: N/A;   PATELLA-FEMORAL ARTHROPLASTY Right 07/14/2019   Procedure: PATELLA-FEMORAL ARTHROPLASTY;  Surgeon: Teryl Lucy, MD;  Location: [facility];  Service: Orthopedics;  Laterality: Right;   PORTACATH PLACEMENT     TUBAL LIGATION  2001    There were no vitals filed for this visit.    Subjective Assessment - 02/20/21 0849     Subjective States that she was doing well. She got COVID and then regressed. States she came to a couple of therapy sessions but was still is not 100% and had to put PT on hold. She returns now as she can focus on PT. States she has a dull achy pain along the medial side of her right knee with walking, standing. States she also has back pain. Reports current pain is 7/10 in her right medial knee and 4/10 in her back and pain is described as achy. States she wants to get to standing and walking straight up.    Pertinent History MS    Limitations Walking;Standing    Patient Stated Goals to be able to stand up straight.    Currently in Pain? Yes    Pain Score 7     Pain Location Knee  Pain Orientation Right;Medial    Pain Descriptors / Indicators Aching    Pain Frequency Intermittent    Aggravating Factors  walking standing                [facility] PT Assessment - 02/20/21 0001       Assessment   Medical Diagnosis Right sided sciatica, R leg pain    Referring Provider (PT) Lilyan PuntScott Luking MD    Prior Therapy yes for MS and R TKA      Precautions   Precautions None      Balance Screen   Has the patient fallen in the past 6 months Yes    How many times? 2    Has the patient had a decrease in activity level because of a fear of falling?  No    Is the patient reluctant to leave their home because of a fear of falling?  No      Home Environment   Living Environment Private residence    Living Arrangements Spouse/significant other    Available Help at  Discharge Family    Type of Home House    Home Access Stairs to enter    Home Layout Two level;Able to live on main level with bedroom/bathroom    Home Equipment Walker - 2 wheels;Cane - single point;Shower seat      Prior Function   Level of Independence Independent      Cognition   Overall Cognitive Status Within Functional Limits for tasks assessed      Observation/Other Assessments   Focus on Therapeutic Outcomes (FOTO)  54% function      Palpation   Palpation comment tendernss to palpation along right medial knee/distal adductors.      Transfers   Five time sit to stand comments  16.5   hands on thighs ( on 1/22 pt was at 16.06 seconds)     Ambulation/Gait   Ambulation/Gait Yes    Ambulation Distance (Feet) 330 Feet   was 372 in  1/22   Assistive device None    Gait Pattern Shuffle;Right foot flat;Decreased hip/knee flexion - right;Decreased hip/knee flexion - left;Decreased stride length    Gait velocity reduced    Gait Comments 2MW      Balance   Balance Assessed Yes      Static Standing Balance   Static Standing - Balance Support No upper extremity supported    Static Standing Balance -  Activities  Single Leg Stance - Right Leg;Single Leg Stance - Left Leg    Static Standing - Comment/# of Minutes SLS 8 seconds on both after multiple attempts                        Objective measurements completed on examination: See above findings.       [facility] Adult PT Treatment/Exercise - 02/20/21 0001       Exercises   Exercises Knee/Hip      Knee/Hip Exercises: Seated   Other Seated Knee/Hip Exercises with massage stick 3 minutes to medial knee                     PT Education - 02/20/21 0913     Education Details on current presentation, HEP, FOTO score and POC    Person(s) Educated Patient    Methods Explanation    Comprehension Verbalized understanding              PT Short Term Goals -  02/20/21 0852       PT SHORT TERM  GOAL #1   Title Patient will report at least 50% improvement in overall symptoms and/or function to demonstrate improved functional mobility    Time 3    Period Weeks    Status New    Target Date 03/13/21      PT SHORT TERM GOAL #2   Title Patient will be independent in self management strategies to improve quality of life and functional outcomes.    Time 3    Period Weeks    Status New    Target Date 03/13/21      PT SHORT TERM GOAL #3   Title Patient will be able to walk at least 375 feet in 2 minutes to demonstrate improved LE strength    Time 3    Period Weeks    Status New               PT Long Term Goals - 02/20/21 1191       PT LONG TERM GOAL #1   Title Patient will report at least 75% improvement in overall symptoms and/or function to demonstrate improved functional mobility    Time 6    Period Weeks    Status New    Target Date 04/03/21      PT LONG TERM GOAL #2   Title Patient will be able to stand for at least 15 seconds on either leg in single leg stance to improve static balance    Time 6    Period Weeks    Status New    Target Date 04/03/21      PT LONG TERM GOAL #3   Title Patient will be able to ambulate with fair posture (not dragging feet and reduced slumped posture) for entire 2 minutes during 2 minute walk test to improve functional mobility and posture.    Time 6    Period Weeks    Status New    Target Date 04/03/21      PT LONG TERM GOAL #4   Title Patient will meet predicted FOTO score to demonstrate improved overall function.    Time 6    Period Weeks    Status New    Target Date 04/03/21                    Plan - 02/20/21 0909     Clinical Impression Statement Patient is a 50 y.o. female who presents to physical therapy with complaint of back and right knee pain with history of MS and R TKA. Patient is known to this clinic for previous treatment and wanted to perform PT after recent COVID illness but it was not the right  time for her. She returns to therapy today to address functional limitations that presented after her hospitalization for COVID. Patient demonstrates decreased strength, ROM restriction, balance deficits and gait abnormalities which are likely contributing to symptoms of pain and are negatively impacting patient ability to perform ADLs and functional mobility tasks. Patient will benefit from skilled physical therapy services to address these deficits to reduce pain, improve level of function with ADLs, functional mobility tasks, and reduce risk for falls.    Personal Factors and Comorbidities Comorbidity 1;Comorbidity 2    Comorbidities R TKA, MS    Examination-Activity Limitations Bend;Squat;Stairs;Stand;Locomotion Level;Lift;Transfers    Examination-Participation Restrictions Meal Prep;Community Activity;Shop;Cleaning    Stability/Clinical Decision Making Stable/Uncomplicated    Clinical Decision Making Low  Rehab Potential Good    PT Frequency 2x / week    PT Duration 6 weeks    PT Treatment/Interventions ADLs/Self Care Home Management;Aquatic Therapy;Cryotherapy;Canalith Repostioning;Electrical Stimulation;Moist Heat;Traction;Balance training;Therapeutic exercise;Therapeutic activities;Stair training;Gait training;Neuromuscular re-education;Patient/family education;Manual techniques;Dry needling;Joint Manipulations;Passive range of motion    PT Next Visit Plan walking endurace and posture, LE strengthening and stretches/ ROM, static balance, functional strength - address knee and back pain as indicated    PT Home Exercise Plan self mobilization with rolling stick    Consulted and Agree with Plan of Care Patient             Patient will benefit from skilled therapeutic intervention in order to improve the following deficits and impairments:  Pain, Abnormal gait, Decreased endurance, Decreased activity tolerance, Decreased strength, Decreased mobility, Decreased balance, Decreased range of  motion, Postural dysfunction, Improper body mechanics, Difficulty walking  Visit Diagnosis: Muscle weakness (generalized)  Difficulty in walking, not elsewhere classified  Right knee pain, unspecified chronicity  Chronic right-sided low back pain with right-sided sciatica     Problem List Patient Active Problem List   Diagnosis Date Noted   Acute hypoxemic respiratory failure due to COVID-19 (HCC) 02/06/2020   Pneumonia due to COVID-19 virus 02/04/2020   Elevated transaminase level 02/04/2020   Patellofemoral arthritis of right knee 07/14/2019   History of DVT in adulthood 06/23/2019   Right leg pain 03/12/2017   Gait abnormality 03/12/2017   Radiculopathy due to lumbar intervertebral disc disorder 09/21/2016   Spondylosis without myelopathy or radiculopathy, lumbar region 09/21/2016   History of anxiety 09/08/2016   Primary osteoarthritis of right knee 08/30/2016   Primary insomnia 08/30/2016   Myofascial pain 08/30/2016   Sciatica, right side 12/12/2015   Right knee pain 12/12/2015   Morbid obesity (HCC) 08/12/2015   Lumbago 02/10/2014   Chronic anxiety 07/08/2013   Swelling of breast 04/08/2013   Chronic pain syndrome 03/11/2013   Port catheter in place 12/31/2012   Unspecified constipation 10/01/2012   Anemia 10/01/2012   Rectal bleeding 10/01/2012   Elevated alkaline phosphatase level 10/01/2012   MS (multiple sclerosis) (HCC) 09/02/2012   ANEMIA 04/12/2010   OTHER DYSPHAGIA 04/12/2010    9:13 AM, 02/20/21 Tereasa Coop, DPT Physical Therapy with [facility]  [phone] office   [facility] [address] [place], [place], [address] Phone: [phone]   Fax:  [phone]  Name: Melinda Moore MRN: [id number] Date of Birth: November 13, 1970

## 2021-02-22 ENCOUNTER — Other Ambulatory Visit: Payer: Self-pay

## 2021-02-22 ENCOUNTER — Encounter: Payer: Self-pay | Admitting: Family Medicine

## 2021-02-22 ENCOUNTER — Ambulatory Visit (INDEPENDENT_AMBULATORY_CARE_PROVIDER_SITE_OTHER): Payer: 59 | Admitting: Family Medicine

## 2021-02-22 VITALS — BP 109/69 | HR 91 | Temp 97.9°F | Ht 69.0 in | Wt 260.0 lb

## 2021-02-22 DIAGNOSIS — Z79891 Long term (current) use of opiate analgesic: Secondary | ICD-10-CM

## 2021-02-22 DIAGNOSIS — M79604 Pain in right leg: Secondary | ICD-10-CM | POA: Diagnosis not present

## 2021-02-22 DIAGNOSIS — M5431 Sciatica, right side: Secondary | ICD-10-CM

## 2021-02-22 DIAGNOSIS — E785 Hyperlipidemia, unspecified: Secondary | ICD-10-CM

## 2021-02-22 DIAGNOSIS — R5383 Other fatigue: Secondary | ICD-10-CM

## 2021-02-22 MED ORDER — HYDROMORPHONE HCL 4 MG PO TABS: ORAL_TABLET | ORAL | 0 refills | Status: DC

## 2021-02-22 MED ORDER — BUPROPION HCL ER (SR) 150 MG PO TB12: 150.0000 mg | ORAL_TABLET | Freq: Two times a day (BID) | ORAL | 1 refills | Status: DC

## 2021-02-22 MED ORDER — LINACLOTIDE 145 MCG PO CAPS: 145.0000 ug | ORAL_CAPSULE | Freq: Every day | ORAL | 5 refills | Status: DC

## 2021-02-22 NOTE — Patient Instructions (Signed)

## 2021-02-22 NOTE — Progress Notes (Signed)
Subjective:    Patient ID: Melinda Moore, female    DOB: 1971/01/12, 50 y.o.   MRN: [id number]  HPI  Chronic pain menagement This patient was seen today for chronic pain  The medication list was reviewed and updated.  Location of Pain for which the patient has been treated with regarding narcotics: R knee  Onset of this pain: chronic   -Compliance with medication: daily  - Number patient states they take daily: 3   -when was the last dose patient took? 11;00am  The patient was advised the importance of maintaining medication and not using illegal substances with these.  Here for refills and follow up  The patient was educated that we can provide 3 monthly scripts for their medication, it is their responsibility to follow the instructions.  Side effects or complications from medications: none  Patient is aware that pain medications are meant to minimize the severity of the pain to allow their pain levels to improve to allow for better function. They are aware of that pain medications cannot totally remove their pain.  Due for UDT ( at least once per year) : Today  Scale of 1 to 10 ( 1 is least 10 is most) Your pain level without the medicine: 9 Your pain level with medication 4  Scale 1 to 10 ( 1-helps very little, 10 helps very well) How well does your pain medication reduce your pain so you can function better through out the day? 8  Quality of the pain: dull achy  Persistence of the pain: all day   Modifying factors: ice , heat, massage  Questions about shingles  vaccine and starting back on Ritalin    Review of Systems     Objective:   Physical Exam General-in no acute distress Eyes-no discharge Lungs-respiratory rate normal, CTA CV-no murmurs,RRR Extremities skin warm dry no edema Neuro grossly normal Behavior normal, alert        Assessment & Plan:  1. Sciatica, right side Patient is bothered with this mainly in the right knee pain she is already  seen the orthopedist who said her knee replacement looked good causes severe pain she uses approximately 3 tablets a day but occasionally 3-1/2 she states it does a good job helping her with her pain she denies abusing the medicine does not feel drowsy with the medicine  2. Right leg pain Please see above  3. Encounter for long-term opiate analgesic use The patient was seen in followup for chronic pain. A review over at their current pain status was discussed. Drug registry was checked. Prescriptions were given.  Regular follow-up recommended. Discussion was held regarding the importance of compliance with medication as well as pain medication contract.  Patient was informed that medication may cause drowsiness and should not be combined  with other medications/alcohol or street drugs. If the patient feels medication is causing altered alertness then do not drive or operate dangerous equipment. Drug registry checked scripts given Pat's patient does relate that the medicine does help her we will continue it as it has she does have Narcan at home she states it does not cause her drowsiness - ToxASSURE Select 13 (MW), Urine - HYDROmorphone (DILAUDID) 4 MG tablet; Take 1/2 to 1 tablet qid prn , maximum use per day is 3-1/2 tablets  Dispense: 105 tablet; Refill: 0 - HYDROmorphone (DILAUDID) 4 MG tablet; Take one half to 1 tablet 4 times daily as needed maximum 3-1/2 tablets daily  Dispense: 105 tablet; Refill:  0 - HYDROmorphone (DILAUDID) 4 MG tablet; Take 1/2 to 1 tablet TID - QID PRN maximum 3-1/2 tablets  Dispense: 105 tablet; Refill: 0  4. Hyperlipidemia, unspecified hyperlipidemia type Significant hyperlipidemia check lab work follow-up if ongoing troubles or worse - HYDROmorphone (DILAUDID) 4 MG tablet; Take 1/2 to 1 tablet qid prn , maximum use per day is 3-1/2 tablets  Dispense: 105 tablet; Refill: 0 - HYDROmorphone (DILAUDID) 4 MG tablet; Take one half to 1 tablet 4 times daily as needed  maximum 3-1/2 tablets daily  Dispense: 105 tablet; Refill: 0 - HYDROmorphone (DILAUDID) 4 MG tablet; Take 1/2 to 1 tablet TID - QID PRN maximum 3-1/2 tablets  Dispense: 105 tablet; Refill: 0  5. Other fatigue Significant fatigue tiredness check lab work - HYDROmorphone (DILAUDID) 4 MG tablet; Take 1/2 to 1 tablet qid prn , maximum use per day is 3-1/2 tablets  Dispense: 105 tablet; Refill: 0 - HYDROmorphone (DILAUDID) 4 MG tablet; Take one half to 1 tablet 4 times daily as needed maximum 3-1/2 tablets daily  Dispense: 105 tablet; Refill: 0 - HYDROmorphone (DILAUDID) 4 MG tablet; Take 1/2 to 1 tablet TID - QID PRN maximum 3-1/2 tablets  Dispense: 105 tablet; Refill: 0  She does see her MS doctor on a regular basis they cut back on her gabapentin she relates current medication not causing drowsiness

## 2021-02-23 ENCOUNTER — Encounter (HOSPITAL_COMMUNITY): Payer: 59

## 2021-02-26 ENCOUNTER — Other Ambulatory Visit: Payer: Self-pay | Admitting: Family Medicine

## 2021-02-26 LAB — TOXASSURE SELECT 13 (MW), URINE

## 2021-02-28 ENCOUNTER — Encounter (HOSPITAL_COMMUNITY): Payer: Self-pay

## 2021-02-28 ENCOUNTER — Other Ambulatory Visit: Payer: Self-pay

## 2021-02-28 ENCOUNTER — Ambulatory Visit (HOSPITAL_COMMUNITY): Payer: 59

## 2021-02-28 ENCOUNTER — Encounter: Payer: Self-pay | Admitting: Family Medicine

## 2021-02-28 DIAGNOSIS — G8929 Other chronic pain: Secondary | ICD-10-CM | POA: Diagnosis not present

## 2021-02-28 DIAGNOSIS — R262 Difficulty in walking, not elsewhere classified: Secondary | ICD-10-CM

## 2021-02-28 DIAGNOSIS — M5441 Lumbago with sciatica, right side: Secondary | ICD-10-CM | POA: Diagnosis not present

## 2021-02-28 DIAGNOSIS — M6281 Muscle weakness (generalized): Secondary | ICD-10-CM

## 2021-02-28 DIAGNOSIS — M25561 Pain in right knee: Secondary | ICD-10-CM | POA: Diagnosis not present

## 2021-02-28 NOTE — Therapy (Signed)
[facility] [address] [place], [place], [address] Phone: [phone]   Fax:  [phone]  Physical Therapy Treatment  Patient Details  Name: Melinda Moore MRN: [id number] Date of Birth: 1970/10/19 Referring Provider (PT): Lilyan Punt MD   Encounter Date: 02/28/2021   PT End of Session - 02/28/21 1139     Visit Number 2    Number of Visits 12    Date for PT Re-Evaluation 04/03/21    Authorization Type united healthcare  VL 60 ST/OT/PT - no auth, secondary [facility] medicare no VL or auth    Progress Note Due on Visit 10    PT Start Time 1133    PT Stop Time 1212    PT Time Calculation (min) 39 min    Activity Tolerance Patient tolerated treatment well;No increased pain    Behavior During Therapy WFL for tasks assessed/performed             Past Medical History:  Diagnosis Date   Anemia    Anxiety    Bell's palsy    Depression    DVT (deep venous thrombosis) (HCC) 09/02/2012   [place] on 02/13/2012 showed acute DVT in left calf veins and posterior tibial veins   Fibromyalgia    GERD (gastroesophageal reflux disease)    History of blood transfusion 2000   History of trigeminal neuralgia    HTN (hypertension)    patient denies was taking a blood pressure medication in early 2000 but it was migraines   Leukopenia    MS (multiple sclerosis) (HCC) 1997   Neuropathic pain    Peripheral edema    Port catheter in place 12/31/2012   Right bundle branch block     Past Surgical History:  Procedure Laterality Date   ABDOMINAL HYSTERECTOMY     CHOLECYSTECTOMY     ESOPHAGOGASTRODUODENOSCOPY  2011   Dr. Jena Gauss. Possible cervical esophageal with status post disruption with passage of Maloney dilator, glycol esophageal erythema, antral erosions. Biopsy from the stomach revealed minimal chronic inactive inflammation but negative for H. pylori   LAPAROSCOPIC GASTRIC SLEEVE RESECTION N/A 04/17/2016   Procedure: LAPAROSCOPIC GASTRIC SLEEVE RESECTION  WITH UPPER ENDOSCOPY;  Surgeon: Glenna Fellows, MD;  Location: [facility];  Service: General;  Laterality: N/A;   PATELLA-FEMORAL ARTHROPLASTY Right 07/14/2019   Procedure: PATELLA-FEMORAL ARTHROPLASTY;  Surgeon: Teryl Lucy, MD;  Location: [facility];  Service: Orthopedics;  Laterality: Right;   PORTACATH PLACEMENT     TUBAL LIGATION  2001    There were no vitals filed for this visit.   Subjective Assessment - 02/28/21 1137     Subjective Pt stated pain continues in Rt medial knee and lower back, feels she is getting sciatic like symptoms Rt LE to foot.  Reports she has purchased rolling stick and feels it helps with knee.  Reports stiffness in lower back today.  Fatigue levels at 2-3/10 today.    Pertinent History MS    Patient Stated Goals to be able to stand up straight.    Currently in Pain? Yes    Pain Score 7     Pain Location Knee   Rt knee and LBP   Pain Orientation Right;Medial    Pain Descriptors / Indicators Aching    Pain Type Chronic pain    Pain Onset More than a month ago    Pain Frequency Constant    Aggravating Factors  walking, standing  [facility] PT Assessment - 02/28/21 0001       Assessment   Medical Diagnosis Right sided sciatica, R leg pain    Referring Provider (PT) Lilyan Punt MD    Prior Therapy yes for MS and R TKA      Precautions   Precautions None      ROM / Strength   AROM / PROM / Strength AROM      AROM   Overall AROM  Deficits    AROM Assessment Site Hip    Right/Left Hip Right;Left    Right Hip Extension 0    Left Hip Extension 0      Special Tests    Special Tests Hip Special Tests   thomas   Hip Special Tests  [facility] Test    Findings Positive    Side Right;Left    Comments Hip flexor tightness BLE                           [facility] Adult PT Treatment/Exercise - 02/28/21 0001       Ambulation/Gait   Ambulation/Gait Yes    Ambulation Distance (Feet) 226 Feet    Assistive  device None    Gait Pattern Shuffle;Right foot flat;Decreased hip/knee flexion - right;Decreased hip/knee flexion - left;Decreased stride length    Gait Comments Cueing for heel to toe mechanics to reduce shuffling      Exercises   Exercises Knee/Hip      Knee/Hip Exercises: Stretches   Hip Flexor Stretch Both;2 reps;30 seconds    Hip Flexor Stretch Limitations supine thomas hip flexor stretch on EOB    Other Knee/Hip Stretches standing lumber extension    Other Knee/Hip Stretches POE x 2 min, pressup 5-10"      Knee/Hip Exercises: Standing   Heel Raises 10 reps      Knee/Hip Exercises: Seated   Sit to Sand 10 reps;without UE support   eccentric control     Knee/Hip Exercises: Supine   Bridges 10 reps    Bridges Limitations 3" hold      Knee/Hip Exercises: Prone   Hip Extension Both;5 reps    Hip Extension Limitations tactile cueing to reduce trunk rotation    Other Prone Exercises POE, press-up 5x10"                     PT Education - 02/28/21 1147     Education Details Reviewed goals, educated importance of HEP compliance with additional strengthening exercises added with handout.    Person(s) Educated Patient    Methods Explanation    Comprehension Verbalized understanding;Returned demonstration              PT Short Term Goals - 02/20/21 0852       PT SHORT TERM GOAL #1   Title Patient will report at least 50% improvement in overall symptoms and/or function to demonstrate improved functional mobility    Time 3    Period Weeks    Status New    Target Date 03/13/21      PT SHORT TERM GOAL #2   Title Patient will be independent in self management strategies to improve quality of life and functional outcomes.    Time 3    Period Weeks    Status New    Target Date 03/13/21      PT SHORT TERM GOAL #3   Title Patient will  be able to walk at least 375 feet in 2 minutes to demonstrate improved LE strength    Time 3    Period Weeks    Status New                PT Long Term Goals - 02/20/21 1962       PT LONG TERM GOAL #1   Title Patient will report at least 75% improvement in overall symptoms and/or function to demonstrate improved functional mobility    Time 6    Period Weeks    Status New    Target Date 04/03/21      PT LONG TERM GOAL #2   Title Patient will be able to stand for at least 15 seconds on either leg in single leg stance to improve static balance    Time 6    Period Weeks    Status New    Target Date 04/03/21      PT LONG TERM GOAL #3   Title Patient will be able to ambulate with fair posture (not dragging feet and reduced slumped posture) for entire 2 minutes during 2 minute walk test to improve functional mobility and posture.    Time 6    Period Weeks    Status New    Target Date 04/03/21      PT LONG TERM GOAL #4   Title Patient will meet predicted FOTO score to demonstrate improved overall function.    Time 6    Period Weeks    Status New    Target Date 04/03/21                   Plan - 02/28/21 1256     Clinical Impression Statement Began session reviewing goals, educated importance of HEP compliance that was established this session.  Pt presents with forward flexed posture with tight hip flexors.  Educated importance of posture for pain control.  Added extension based stretches and gluteal strengthening to assist with gait.  Pt able to complete all exericses with reoprts of back pain reduced.  Cueing to improve gait mechanics to reduce shuffling, encouraged pt to improve awareness and to begin walking program at home.  EOS pt given HEP for hip strengthening and stretches, able to demonstrate and verbalize understanding.  Fatigue leves stayed between 2-3/10 through session.    Personal Factors and Comorbidities Comorbidity 1;Comorbidity 2    Comorbidities R TKA, MS    Examination-Activity Limitations Bend;Squat;Stairs;Stand;Locomotion Level;Lift;Transfers     Examination-Participation Restrictions Meal Prep;Community Activity;Shop;Cleaning    Stability/Clinical Decision Making Stable/Uncomplicated    Clinical Decision Making Low    Rehab Potential Good    PT Frequency 2x / week    PT Duration 6 weeks    PT Treatment/Interventions ADLs/Self Care Home Management;Aquatic Therapy;Cryotherapy;Canalith Repostioning;Electrical Stimulation;Moist Heat;Traction;Balance training;Therapeutic exercise;Therapeutic activities;Stair training;Gait training;Neuromuscular re-education;Patient/family education;Manual techniques;Dry needling;Joint Manipulations;Passive range of motion    PT Next Visit Plan walking endurace and posture, LE strengthening and stretches/ ROM, static balance, functional strength - address knee and back pain as indicated    PT Home Exercise Plan self mobilization with rolling stick; 9/20: STS, heel raise, lumbar extension, POE/pressup, bridge, thomas stretch    Consulted and Agree with Plan of Care Patient             Patient will benefit from skilled therapeutic intervention in order to improve the following deficits and impairments:  Pain, Abnormal gait, Decreased endurance, Decreased activity tolerance, Decreased strength, Decreased mobility, Decreased  balance, Decreased range of motion, Postural dysfunction, Improper body mechanics, Difficulty walking  Visit Diagnosis: Muscle weakness (generalized)  Difficulty in walking, not elsewhere classified  Right knee pain, unspecified chronicity  Chronic right-sided low back pain with right-sided sciatica     Problem List Patient Active Problem List   Diagnosis Date Noted   Acute hypoxemic respiratory failure due to COVID-19 (HCC) 02/06/2020   Pneumonia due to COVID-19 virus 02/04/2020   Elevated transaminase level 02/04/2020   Patellofemoral arthritis of right knee 07/14/2019   History of DVT in adulthood 06/23/2019   Right leg pain 03/12/2017   Gait abnormality 03/12/2017    Radiculopathy due to lumbar intervertebral disc disorder 09/21/2016   Spondylosis without myelopathy or radiculopathy, lumbar region 09/21/2016   History of anxiety 09/08/2016   Primary osteoarthritis of right knee 08/30/2016   Primary insomnia 08/30/2016   Myofascial pain 08/30/2016   Sciatica, right side 12/12/2015   Right knee pain 12/12/2015   Morbid obesity (HCC) 08/12/2015   Lumbago 02/10/2014   Chronic anxiety 07/08/2013   Swelling of breast 04/08/2013   Chronic pain syndrome 03/11/2013   Port catheter in place 12/31/2012   Unspecified constipation 10/01/2012   Anemia 10/01/2012   Rectal bleeding 10/01/2012   Elevated alkaline phosphatase level 10/01/2012   MS (multiple sclerosis) (HCC) 09/02/2012   ANEMIA 04/12/2010   OTHER DYSPHAGIA 04/12/2010   Becky Sax, LPTA/CLT; CBIS [phone]  Juel Burrow, PTA 02/28/2021, 1:09 PM  [facility] [address] [place], [place], [address] Phone: [phone]   Fax:  [phone]  Name: Melinda Moore MRN: [id number] Date of Birth: 1970/10/08

## 2021-02-28 NOTE — Patient Instructions (Addendum)
Functional Quadriceps: Sit to Stand    Sit on edge of chair, feet flat on floor. Stand upright, extending knees fully. Repeat ____ times per set. Do ____ sets per session. Do ____ sessions per day.  http://orth.exer.us/735   Copyright  VHI. All rights reserved.   Standing Arch (Extension)    Place hands in small of back. Using hands as fulcrum, arch backward. Try to keep knees straight. Great exercise if sitting makes pain worse. Use to break up long periods of sitting. Repeat 10 times, hold for 5". Do 2 sessions per day.  http://gt2.exer.us/248   Copyright  VHI. All rights reserved.   Heel Raises    Stand with support. Tighten pelvic floor and hold. With knees straight, raise heels off ground. Hold 3-5 seconds. Repeat 10 times. Do 2 times a day.  Copyright  VHI. All rights reserved.    Back Hyperextension: Using Arms    Lying face down with arms bent, inhale. Then while exhaling, straighten arms. Hold 10 seconds. Slowly return to starting position. Repeat 10 times per set. Do 2 sets per day.  Copyright  VHI. All rights reserved.   Bridge    Lie back, legs bent. Inhale, pressing hips up. Keeping ribs in, lengthen lower back. Exhale, rolling down along spine from top. Repeat 10 times. Do 2 sessions per day.  http://pm.exer.us/55   Copyright  VHI. All rights reserved.   HIP: Flexors - Supine    Lie on edge of surface. Place leg off the surface, allow knee to bend. Bring other knee toward chest. Hold 30 seconds. 3 reps per set, 3 sets per day, 4 days per week Rest lowered foot on stool.  Copyright  VHI. All rights reserved.

## 2021-03-01 ENCOUNTER — Encounter (HOSPITAL_COMMUNITY): Payer: 59 | Admitting: Physical Therapy

## 2021-03-07 ENCOUNTER — Ambulatory Visit (HOSPITAL_COMMUNITY): Payer: 59

## 2021-03-09 ENCOUNTER — Ambulatory Visit (HOSPITAL_COMMUNITY): Payer: 59 | Admitting: Physical Therapy

## 2021-03-09 ENCOUNTER — Telehealth (HOSPITAL_COMMUNITY): Payer: Self-pay | Admitting: Physical Therapy

## 2021-03-09 NOTE — Telephone Encounter (Signed)
Pt had a pipe to burst in her home and the plumber is on the way she will have to cx today. Sorry for the late notice she thought he would have been there tomorrow not today,.

## 2021-03-13 ENCOUNTER — Telehealth (HOSPITAL_COMMUNITY): Payer: Self-pay | Admitting: Physical Therapy

## 2021-03-13 NOTE — Telephone Encounter (Signed)
She has a brusted pipe in her home and will have repair men in her home all wk she will return on 10/11.

## 2021-03-14 ENCOUNTER — Ambulatory Visit (HOSPITAL_COMMUNITY): Payer: 59 | Admitting: Physical Therapy

## 2021-03-16 ENCOUNTER — Encounter (HOSPITAL_COMMUNITY): Payer: 59 | Admitting: Physical Therapy

## 2021-03-21 ENCOUNTER — Other Ambulatory Visit: Payer: Self-pay

## 2021-03-21 ENCOUNTER — Ambulatory Visit (HOSPITAL_COMMUNITY): Payer: 59 | Attending: Family Medicine | Admitting: Physical Therapy

## 2021-03-21 ENCOUNTER — Encounter (HOSPITAL_COMMUNITY): Payer: Self-pay | Admitting: Physical Therapy

## 2021-03-21 DIAGNOSIS — G8929 Other chronic pain: Secondary | ICD-10-CM | POA: Diagnosis not present

## 2021-03-21 DIAGNOSIS — R262 Difficulty in walking, not elsewhere classified: Secondary | ICD-10-CM | POA: Insufficient documentation

## 2021-03-21 DIAGNOSIS — M25561 Pain in right knee: Secondary | ICD-10-CM | POA: Insufficient documentation

## 2021-03-21 DIAGNOSIS — M6281 Muscle weakness (generalized): Secondary | ICD-10-CM | POA: Diagnosis not present

## 2021-03-21 DIAGNOSIS — M5441 Lumbago with sciatica, right side: Secondary | ICD-10-CM | POA: Diagnosis not present

## 2021-03-21 NOTE — Therapy (Signed)
[facility] [address] [place], [place], [address] Phone: [phone]   Fax:  [phone]  Physical Therapy Treatment  Patient Details  Name: Melinda Moore MRN: [id number] Date of Birth: Jan 03, 1971 Referring Provider (PT): Lilyan Punt MD   Encounter Date: 03/21/2021   PT End of Session - 03/21/21 1136     Visit Number 3    Number of Visits 12    Date for PT Re-Evaluation 04/03/21    Authorization Type united healthcare  VL 60 ST/OT/PT - no auth, secondary [facility] medicare no VL or auth    Progress Note Due on Visit 10    PT Start Time 1135   late to check in   PT Stop Time 1213    PT Time Calculation (min) 38 min    Activity Tolerance Patient tolerated treatment well;No increased pain    Behavior During Therapy WFL for tasks assessed/performed             Past Medical History:  Diagnosis Date   Anemia    Anxiety    Bell's palsy    Depression    DVT (deep venous thrombosis) (HCC) 09/02/2012   [place] on 02/13/2012 showed acute DVT in left calf veins and posterior tibial veins   Fibromyalgia    GERD (gastroesophageal reflux disease)    History of blood transfusion 2000   History of trigeminal neuralgia    HTN (hypertension)    patient denies was taking a blood pressure medication in early 2000 but it was migraines   Leukopenia    MS (multiple sclerosis) (HCC) 1997   Neuropathic pain    Peripheral edema    Port catheter in place 12/31/2012   Right bundle branch block     Past Surgical History:  Procedure Laterality Date   ABDOMINAL HYSTERECTOMY     CHOLECYSTECTOMY     ESOPHAGOGASTRODUODENOSCOPY  2011   Dr. Jena Gauss. Possible cervical esophageal with status post disruption with passage of Maloney dilator, glycol esophageal erythema, antral erosions. Biopsy from the stomach revealed minimal chronic inactive inflammation but negative for H. pylori   LAPAROSCOPIC GASTRIC SLEEVE RESECTION N/A 04/17/2016   Procedure: LAPAROSCOPIC  GASTRIC SLEEVE RESECTION WITH UPPER ENDOSCOPY;  Surgeon: Glenna Fellows, MD;  Location: [facility];  Service: General;  Laterality: N/A;   PATELLA-FEMORAL ARTHROPLASTY Right 07/14/2019   Procedure: PATELLA-FEMORAL ARTHROPLASTY;  Surgeon: Teryl Lucy, MD;  Location: [facility];  Service: Orthopedics;  Laterality: Right;   PORTACATH PLACEMENT     TUBAL LIGATION  2001    There were no vitals filed for this visit.   Subjective Assessment - 03/21/21 1140     Subjective Reports she has a little pain in her right leg. States her house is still a mess.    Pertinent History MS    Patient Stated Goals to be able to stand up straight.    Pain Score 8     Pain Location Leg    Pain Orientation Right;Medial;Lateral   at knee   Pain Descriptors / Indicators Aching    Pain Onset More than a month ago                [facility] PT Assessment - 03/21/21 0001       Assessment   Medical Diagnosis Right sided sciatica, R leg pain    Referring Provider (PT) Lilyan Punt MD  [facility] Adult PT Treatment/Exercise - 03/21/21 0001       Ambulation/Gait   Ambulation/Gait Yes    Ambulation Distance (Feet) 226 Feet    Gait Comments cues to not drag feet.      Knee/Hip Exercises: Stretches   Active Hamstring Stretch Right;3 reps;30 seconds   seated   Piriformis Stretch Both;3 reps;30 seconds   seated   Other Knee/Hip Stretches seated lumbar side bending 2.5 minutes total with 5 second holds on each side    Other Knee/Hip Stretches lumbar flexion seated 2 minutes      Knee/Hip Exercises: Standing   Other Standing Knee Exercises chair pose x6 10" holds and UE support                       PT Short Term Goals - 02/20/21 2951       PT SHORT TERM GOAL #1   Title Patient will report at least 50% improvement in overall symptoms and/or function to demonstrate improved functional mobility    Time 3    Period Weeks    Status New    Target Date 03/13/21       PT SHORT TERM GOAL #2   Title Patient will be independent in self management strategies to improve quality of life and functional outcomes.    Time 3    Period Weeks    Status New    Target Date 03/13/21      PT SHORT TERM GOAL #3   Title Patient will be able to walk at least 375 feet in 2 minutes to demonstrate improved LE strength    Time 3    Period Weeks    Status New               PT Long Term Goals - 02/20/21 8841       PT LONG TERM GOAL #1   Title Patient will report at least 75% improvement in overall symptoms and/or function to demonstrate improved functional mobility    Time 6    Period Weeks    Status New    Target Date 04/03/21      PT LONG TERM GOAL #2   Title Patient will be able to stand for at least 15 seconds on either leg in single leg stance to improve static balance    Time 6    Period Weeks    Status New    Target Date 04/03/21      PT LONG TERM GOAL #3   Title Patient will be able to ambulate with fair posture (not dragging feet and reduced slumped posture) for entire 2 minutes during 2 minute walk test to improve functional mobility and posture.    Time 6    Period Weeks    Status New    Target Date 04/03/21      PT LONG TERM GOAL #4   Title Patient will meet predicted FOTO score to demonstrate improved overall function.    Time 6    Period Weeks    Status New    Target Date 04/03/21                   Plan - 03/21/21 1201     Clinical Impression Statement Session focused on lumbar and hip mobility initially which helped with tightness and overall pain levels. Educed with strengthening exercises which patient performed well. Reported 3/10 pain end of session and improved standing posture noted. Will continue  with current POC as tolerated.    Personal Factors and Comorbidities Comorbidity 1;Comorbidity 2    Comorbidities R TKA, MS    Examination-Activity Limitations Bend;Squat;Stairs;Stand;Locomotion Level;Lift;Transfers     Examination-Participation Restrictions Meal Prep;Community Activity;Shop;Cleaning    Stability/Clinical Decision Making Stable/Uncomplicated    Rehab Potential Good    PT Frequency 2x / week    PT Duration 6 weeks    PT Treatment/Interventions ADLs/Self Care Home Management;Aquatic Therapy;Cryotherapy;Canalith Repostioning;Electrical Stimulation;Moist Heat;Traction;Balance training;Therapeutic exercise;Therapeutic activities;Stair training;Gait training;Neuromuscular re-education;Patient/family education;Manual techniques;Dry needling;Joint Manipulations;Passive range of motion    PT Next Visit Plan walking endurace and posture, LE strengthening and stretches/ ROM, static balance, functional strength - address knee and back pain as indicated    PT Home Exercise Plan self mobilization with rolling stick; 9/20: STS, heel raise, lumbar extension, POE/pressup, bridge, thomas stretch; STS Trunk ROT, flexion, side flexion, STS slow    Consulted and Agree with Plan of Care Patient             Patient will benefit from skilled therapeutic intervention in order to improve the following deficits and impairments:  Pain, Abnormal gait, Decreased endurance, Decreased activity tolerance, Decreased strength, Decreased mobility, Decreased balance, Decreased range of motion, Postural dysfunction, Improper body mechanics, Difficulty walking  Visit Diagnosis: Muscle weakness (generalized)  Difficulty in walking, not elsewhere classified  Right knee pain, unspecified chronicity  Chronic right-sided low back pain with right-sided sciatica     Problem List Patient Active Problem List   Diagnosis Date Noted   Acute hypoxemic respiratory failure due to COVID-19 (HCC) 02/06/2020   Pneumonia due to COVID-19 virus 02/04/2020   Elevated transaminase level 02/04/2020   Patellofemoral arthritis of right knee 07/14/2019   History of DVT in adulthood 06/23/2019   Right leg pain 03/12/2017   Gait  abnormality 03/12/2017   Radiculopathy due to lumbar intervertebral disc disorder 09/21/2016   Spondylosis without myelopathy or radiculopathy, lumbar region 09/21/2016   History of anxiety 09/08/2016   Primary osteoarthritis of right knee 08/30/2016   Primary insomnia 08/30/2016   Myofascial pain 08/30/2016   Sciatica, right side 12/12/2015   Right knee pain 12/12/2015   Morbid obesity (HCC) 08/12/2015   Lumbago 02/10/2014   Chronic anxiety 07/08/2013   Swelling of breast 04/08/2013   Chronic pain syndrome 03/11/2013   Port catheter in place 12/31/2012   Unspecified constipation 10/01/2012   Anemia 10/01/2012   Rectal bleeding 10/01/2012   Elevated alkaline phosphatase level 10/01/2012   MS (multiple sclerosis) (HCC) 09/02/2012   ANEMIA 04/12/2010   OTHER DYSPHAGIA 04/12/2010    12:15 PM, 03/21/21 Tereasa Coop, DPT Physical Therapy with [facility]  [phone] office   [facility] [address] [place], [place], [address] Phone: [phone]   Fax:  [phone]  Name: Melinda Moore MRN: [id number] Date of Birth: [birth year]

## 2021-03-22 ENCOUNTER — Other Ambulatory Visit (HOSPITAL_COMMUNITY): Payer: Self-pay | Admitting: Family Medicine

## 2021-03-22 DIAGNOSIS — Z1231 Encounter for screening mammogram for malignant neoplasm of breast: Secondary | ICD-10-CM

## 2021-03-23 ENCOUNTER — Ambulatory Visit (HOSPITAL_COMMUNITY): Payer: 59 | Admitting: Physical Therapy

## 2021-03-23 ENCOUNTER — Other Ambulatory Visit: Payer: Self-pay

## 2021-03-23 ENCOUNTER — Encounter (HOSPITAL_COMMUNITY): Payer: Self-pay | Admitting: Physical Therapy

## 2021-03-23 DIAGNOSIS — M25561 Pain in right knee: Secondary | ICD-10-CM | POA: Diagnosis not present

## 2021-03-23 DIAGNOSIS — R262 Difficulty in walking, not elsewhere classified: Secondary | ICD-10-CM | POA: Diagnosis not present

## 2021-03-23 DIAGNOSIS — M6281 Muscle weakness (generalized): Secondary | ICD-10-CM | POA: Diagnosis not present

## 2021-03-23 DIAGNOSIS — G8929 Other chronic pain: Secondary | ICD-10-CM

## 2021-03-23 DIAGNOSIS — M5441 Lumbago with sciatica, right side: Secondary | ICD-10-CM

## 2021-03-23 NOTE — Therapy (Signed)
[facility] [address] [place], [place], [address] Phone: [phone]   Fax:  [phone]  Physical Therapy Treatment  Patient Details  Name: Melinda Moore MRN: [id number] Date of Birth: 1970/07/04 Referring Provider (PT): Lilyan Punt MD   Encounter Date: 03/23/2021   PT End of Session - 03/23/21 1128     Visit Number 4    Number of Visits 12    Date for PT Re-Evaluation 04/03/21    Authorization Type united healthcare  VL 60 ST/OT/PT - no auth, secondary [facility] medicare no VL or auth    Progress Note Due on Visit 10    PT Start Time 1130    PT Stop Time 1210    PT Time Calculation (min) 40 min    Activity Tolerance Patient tolerated treatment well;No increased pain    Behavior During Therapy WFL for tasks assessed/performed             Past Medical History:  Diagnosis Date   Anemia    Anxiety    Bell's palsy    Depression    DVT (deep venous thrombosis) (HCC) 09/02/2012   [place] on 02/13/2012 showed acute DVT in left calf veins and posterior tibial veins   Fibromyalgia    GERD (gastroesophageal reflux disease)    History of blood transfusion 2000   History of trigeminal neuralgia    HTN (hypertension)    patient denies was taking a blood pressure medication in early 2000 but it was migraines   Leukopenia    MS (multiple sclerosis) (HCC) 1997   Neuropathic pain    Peripheral edema    Port catheter in place 12/31/2012   Right bundle branch block     Past Surgical History:  Procedure Laterality Date   ABDOMINAL HYSTERECTOMY     CHOLECYSTECTOMY     ESOPHAGOGASTRODUODENOSCOPY  2011   Dr. Jena Gauss. Possible cervical esophageal with status post disruption with passage of Maloney dilator, glycol esophageal erythema, antral erosions. Biopsy from the stomach revealed minimal chronic inactive inflammation but negative for H. pylori   LAPAROSCOPIC GASTRIC SLEEVE RESECTION N/A 04/17/2016   Procedure: LAPAROSCOPIC GASTRIC SLEEVE RESECTION  WITH UPPER ENDOSCOPY;  Surgeon: Glenna Fellows, MD;  Location: [facility];  Service: General;  Laterality: N/A;   PATELLA-FEMORAL ARTHROPLASTY Right 07/14/2019   Procedure: PATELLA-FEMORAL ARTHROPLASTY;  Surgeon: Teryl Lucy, MD;  Location: [facility];  Service: Orthopedics;  Laterality: Right;   PORTACATH PLACEMENT     TUBAL LIGATION  2001    There were no vitals filed for this visit.   Subjective Assessment - 03/23/21 1133     Subjective States that she is doing well, exercises are going well. States that she is having a little pain in her back and right knee reports 6/10 pain in her knee.    Currently in Pain? Yes    Pain Score 6     Pain Location Back    Pain Orientation Right    Pain Descriptors / Indicators Aching;Dull                [facility] PT Assessment - 03/23/21 0001       Assessment   Medical Diagnosis Right sided sciatica, R leg pain    Referring Provider (PT) Lilyan Punt MD                           [facility] Adult PT Treatment/Exercise - 03/23/21 0001  Knee/Hip Exercises: Standing   Other Standing Knee Exercises mini wall squats x10 10" holds      Knee/Hip Exercises: Seated   Sit to Sand 10 reps;without UE support      Knee/Hip Exercises: Supine   Straight Leg Raises Both;5 reps;5 sets   slow and controlled     Knee/Hip Exercises: Prone   Other Prone Exercises clamshell with green band 3x10 B                     PT Education - 03/23/21 1203     Education Details on current exercises, anatomy and rational for exercises    Person(s) Educated Patient    Methods Explanation    Comprehension Verbalized understanding              PT Short Term Goals - 02/20/21 5176       PT SHORT TERM GOAL #1   Title Patient will report at least 50% improvement in overall symptoms and/or function to demonstrate improved functional mobility    Time 3    Period Weeks    Status New    Target Date 03/13/21      PT SHORT TERM GOAL #2    Title Patient will be independent in self management strategies to improve quality of life and functional outcomes.    Time 3    Period Weeks    Status New    Target Date 03/13/21      PT SHORT TERM GOAL #3   Title Patient will be able to walk at least 375 feet in 2 minutes to demonstrate improved LE strength    Time 3    Period Weeks    Status New               PT Long Term Goals - 02/20/21 1607       PT LONG TERM GOAL #1   Title Patient will report at least 75% improvement in overall symptoms and/or function to demonstrate improved functional mobility    Time 6    Period Weeks    Status New    Target Date 04/03/21      PT LONG TERM GOAL #2   Title Patient will be able to stand for at least 15 seconds on either leg in single leg stance to improve static balance    Time 6    Period Weeks    Status New    Target Date 04/03/21      PT LONG TERM GOAL #3   Title Patient will be able to ambulate with fair posture (not dragging feet and reduced slumped posture) for entire 2 minutes during 2 minute walk test to improve functional mobility and posture.    Time 6    Period Weeks    Status New    Target Date 04/03/21      PT LONG TERM GOAL #4   Title Patient will meet predicted FOTO score to demonstrate improved overall function.    Time 6    Period Weeks    Status New    Target Date 04/03/21                   Plan - 03/23/21 1128     Clinical Impression Statement Focused on unilateral exercises against gravity today. No pain noted but cues for form throughout . Added all new exercises to HEP and provided patient with green TheraBand for home adherence. Will continue with current POC  as tolerated by patient.    Personal Factors and Comorbidities Comorbidity 1;Comorbidity 2    Comorbidities R TKA, MS    Examination-Activity Limitations Bend;Squat;Stairs;Stand;Locomotion Level;Lift;Transfers    Examination-Participation Restrictions Meal Prep;Community  Activity;Shop;Cleaning    Stability/Clinical Decision Making Stable/Uncomplicated    Rehab Potential Good    PT Frequency 2x / week    PT Duration 6 weeks    PT Treatment/Interventions ADLs/Self Care Home Management;Aquatic Therapy;Cryotherapy;Canalith Repostioning;Electrical Stimulation;Moist Heat;Traction;Balance training;Therapeutic exercise;Therapeutic activities;Stair training;Gait training;Neuromuscular re-education;Patient/family education;Manual techniques;Dry needling;Joint Manipulations;Passive range of motion    PT Next Visit Plan walking endurace and posture, LE strengthening and stretches/ ROM, static balance, functional strength - address knee and back pain as indicated    PT Home Exercise Plan self mobilization with rolling stick; 9/20: STS, heel raise, lumbar extension, POE/pressup, bridge, thomas stretch; STS Trunk ROT, flexion, side flexion, STS slow; 10/13 clamshells, SLR    Consulted and Agree with Plan of Care Patient             Patient will benefit from skilled therapeutic intervention in order to improve the following deficits and impairments:  Pain, Abnormal gait, Decreased endurance, Decreased activity tolerance, Decreased strength, Decreased mobility, Decreased balance, Decreased range of motion, Postural dysfunction, Improper body mechanics, Difficulty walking  Visit Diagnosis: Muscle weakness (generalized)  Difficulty in walking, not elsewhere classified  Right knee pain, unspecified chronicity  Chronic right-sided low back pain with right-sided sciatica     Problem List Patient Active Problem List   Diagnosis Date Noted   Acute hypoxemic respiratory failure due to COVID-19 (HCC) 02/06/2020   Pneumonia due to COVID-19 virus 02/04/2020   Elevated transaminase level 02/04/2020   Patellofemoral arthritis of right knee 07/14/2019   History of DVT in adulthood 06/23/2019   Right leg pain 03/12/2017   Gait abnormality 03/12/2017   Radiculopathy due to  lumbar intervertebral disc disorder 09/21/2016   Spondylosis without myelopathy or radiculopathy, lumbar region 09/21/2016   History of anxiety 09/08/2016   Primary osteoarthritis of right knee 08/30/2016   Primary insomnia 08/30/2016   Myofascial pain 08/30/2016   Sciatica, right side 12/12/2015   Right knee pain 12/12/2015   Morbid obesity (HCC) 08/12/2015   Lumbago 02/10/2014   Chronic anxiety 07/08/2013   Swelling of breast 04/08/2013   Chronic pain syndrome 03/11/2013   Port catheter in place 12/31/2012   Unspecified constipation 10/01/2012   Anemia 10/01/2012   Rectal bleeding 10/01/2012   Elevated alkaline phosphatase level 10/01/2012   MS (multiple sclerosis) (HCC) 09/02/2012   ANEMIA 04/12/2010   OTHER DYSPHAGIA 04/12/2010   12:13 PM, 03/23/21 Tereasa Coop, DPT Physical Therapy with [facility]  [phone] office    [facility] [address] [place], [place], [address] Phone: [phone]   Fax:  [phone]  Name: Melinda Moore MRN: [id number] Date of Birth: 10/09/1970

## 2021-03-27 ENCOUNTER — Other Ambulatory Visit: Payer: Self-pay

## 2021-03-27 ENCOUNTER — Ambulatory Visit (HOSPITAL_COMMUNITY): Payer: 59 | Admitting: Physical Therapy

## 2021-03-27 DIAGNOSIS — R262 Difficulty in walking, not elsewhere classified: Secondary | ICD-10-CM | POA: Diagnosis not present

## 2021-03-27 DIAGNOSIS — G8929 Other chronic pain: Secondary | ICD-10-CM

## 2021-03-27 DIAGNOSIS — M5441 Lumbago with sciatica, right side: Secondary | ICD-10-CM | POA: Diagnosis not present

## 2021-03-27 DIAGNOSIS — M6281 Muscle weakness (generalized): Secondary | ICD-10-CM

## 2021-03-27 DIAGNOSIS — M25561 Pain in right knee: Secondary | ICD-10-CM | POA: Diagnosis not present

## 2021-03-27 NOTE — Therapy (Signed)
[facility] [address] [place], [place], [address] Phone: [phone]   Fax:  [phone]  Physical Therapy Treatment  Patient Details  Name: Melinda Moore MRN: [id number] Date of Birth: Mar 04, 1971 Referring Provider (PT): Lilyan Punt MD   Encounter Date: 03/27/2021   PT End of Session - 03/27/21 1130     Visit Number 5    Number of Visits 12    Date for PT Re-Evaluation 04/03/21    Authorization Type united healthcare  VL 60 ST/OT/PT - no auth, secondary [facility] medicare no VL or auth    Progress Note Due on Visit 10    PT Start Time 1126    PT Stop Time 1204    PT Time Calculation (min) 38 min    Activity Tolerance Patient tolerated treatment well;No increased pain    Behavior During Therapy WFL for tasks assessed/performed             Past Medical History:  Diagnosis Date   Anemia    Anxiety    Bell's palsy    Depression    DVT (deep venous thrombosis) (HCC) 09/02/2012   [place] on 02/13/2012 showed acute DVT in left calf veins and posterior tibial veins   Fibromyalgia    GERD (gastroesophageal reflux disease)    History of blood transfusion 2000   History of trigeminal neuralgia    HTN (hypertension)    patient denies was taking a blood pressure medication in early 2000 but it was migraines   Leukopenia    MS (multiple sclerosis) (HCC) 1997   Neuropathic pain    Peripheral edema    Port catheter in place 12/31/2012   Right bundle branch block     Past Surgical History:  Procedure Laterality Date   ABDOMINAL HYSTERECTOMY     CHOLECYSTECTOMY     ESOPHAGOGASTRODUODENOSCOPY  2011   Dr. Jena Gauss. Possible cervical esophageal with status post disruption with passage of Maloney dilator, glycol esophageal erythema, antral erosions. Biopsy from the stomach revealed minimal chronic inactive inflammation but negative for H. pylori   LAPAROSCOPIC GASTRIC SLEEVE RESECTION N/A 04/17/2016   Procedure: LAPAROSCOPIC GASTRIC SLEEVE RESECTION  WITH UPPER ENDOSCOPY;  Surgeon: Glenna Fellows, MD;  Location: [facility];  Service: General;  Laterality: N/A;   PATELLA-FEMORAL ARTHROPLASTY Right 07/14/2019   Procedure: PATELLA-FEMORAL ARTHROPLASTY;  Surgeon: Teryl Lucy, MD;  Location: [facility];  Service: Orthopedics;  Laterality: Right;   PORTACATH PLACEMENT     TUBAL LIGATION  2001    There were no vitals filed for this visit.   Subjective Assessment - 03/27/21 1130     Subjective Increased right knee pain last night that kept her up and thinks it is related to the rain. Current pain level is 8/10    Currently in Pain? Yes    Pain Score 8     Pain Location Knee    Pain Orientation Right    Pain Descriptors / Indicators Aching                [facility] PT Assessment - 03/27/21 0001       Assessment   Medical Diagnosis Right sided sciatica, R leg pain    Referring Provider (PT) Lilyan Punt MD                           [facility] Adult PT Treatment/Exercise - 03/27/21 0001       Knee/Hip Exercises: Stretches  Active Hamstring Stretch Right;3 reps;30 seconds   seated   Piriformis Stretch Both;3 reps;30 seconds    Other Knee/Hip Stretches seated lumbar side bending 2.5 minutes total with 5 second holds on each side, lumbar flexion 5" holds, 2.5 minutes, lumbar rotation 4" holds 2.5 minutes B      Knee/Hip Exercises: Seated   Long Arc Quad 2 sets;15 reps;Both   5" holds   Sit to Sand 3 sets;without UE support   8 reps with arms crossed at chest                      PT Short Term Goals - 02/20/21 2297       PT SHORT TERM GOAL #1   Title Patient will report at least 50% improvement in overall symptoms and/or function to demonstrate improved functional mobility    Time 3    Period Weeks    Status New    Target Date 03/13/21      PT SHORT TERM GOAL #2   Title Patient will be independent in self management strategies to improve quality of life and functional outcomes.    Time 3    Period  Weeks    Status New    Target Date 03/13/21      PT SHORT TERM GOAL #3   Title Patient will be able to walk at least 375 feet in 2 minutes to demonstrate improved LE strength    Time 3    Period Weeks    Status New               PT Long Term Goals - 02/20/21 9892       PT LONG TERM GOAL #1   Title Patient will report at least 75% improvement in overall symptoms and/or function to demonstrate improved functional mobility    Time 6    Period Weeks    Status New    Target Date 04/03/21      PT LONG TERM GOAL #2   Title Patient will be able to stand for at least 15 seconds on either leg in single leg stance to improve static balance    Time 6    Period Weeks    Status New    Target Date 04/03/21      PT LONG TERM GOAL #3   Title Patient will be able to ambulate with fair posture (not dragging feet and reduced slumped posture) for entire 2 minutes during 2 minute walk test to improve functional mobility and posture.    Time 6    Period Weeks    Status New    Target Date 04/03/21      PT LONG TERM GOAL #4   Title Patient will meet predicted FOTO score to demonstrate improved overall function.    Time 6    Period Weeks    Status New    Target Date 04/03/21                   Plan - 03/27/21 1202     Clinical Impression Statement Patient with request for seated exercises secondary to increased pain and positive response to stretches from previous session. Reduced pain noted after stretches and LAQs. Added LAQs to HEP. Encouraged patient to stretch daily to help with pain. Will continue to progress strengthening exercises as tolerated next session pending patient presentation.    Personal Factors and Comorbidities Comorbidity 1;Comorbidity 2    Comorbidities R TKA, MS  Examination-Activity Limitations Bend;Squat;Stairs;Stand;Locomotion Level;Lift;Transfers    Examination-Participation Restrictions Meal Prep;Community Activity;Shop;Cleaning     Stability/Clinical Decision Making Stable/Uncomplicated    Rehab Potential Good    PT Frequency 2x / week    PT Duration 6 weeks    PT Treatment/Interventions ADLs/Self Care Home Management;Aquatic Therapy;Cryotherapy;Canalith Repostioning;Electrical Stimulation;Moist Heat;Traction;Balance training;Therapeutic exercise;Therapeutic activities;Stair training;Gait training;Neuromuscular re-education;Patient/family education;Manual techniques;Dry needling;Joint Manipulations;Passive range of motion    PT Next Visit Plan walking endurace and posture, LE strengthening and stretches/ ROM, static balance, functional strength - address knee and back pain as indicated    PT Home Exercise Plan self mobilization with rolling stick; 9/20: STS, heel raise, lumbar extension, POE/pressup, bridge, thomas stretch; STS Trunk ROT, flexion, side flexion, STS slow; 10/13 clamshells, SLR; 10/17 LAQs    Consulted and Agree with Plan of Care Patient             Patient will benefit from skilled therapeutic intervention in order to improve the following deficits and impairments:  Pain, Abnormal gait, Decreased endurance, Decreased activity tolerance, Decreased strength, Decreased mobility, Decreased balance, Decreased range of motion, Postural dysfunction, Improper body mechanics, Difficulty walking  Visit Diagnosis: Muscle weakness (generalized)  Difficulty in walking, not elsewhere classified  Right knee pain, unspecified chronicity  Chronic right-sided low back pain with right-sided sciatica     Problem List Patient Active Problem List   Diagnosis Date Noted   Acute hypoxemic respiratory failure due to COVID-19 (HCC) 02/06/2020   Pneumonia due to COVID-19 virus 02/04/2020   Elevated transaminase level 02/04/2020   Patellofemoral arthritis of right knee 07/14/2019   History of DVT in adulthood 06/23/2019   Right leg pain 03/12/2017   Gait abnormality 03/12/2017   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 disc disorder 09/21/2016   Spondylosis without myelopathy or radiculopathy, lumbar region 09/21/2016   History of anxiety 09/08/2016   Primary osteoarthritis of right knee 08/30/2016   Primary insomnia 08/30/2016   Myofascial pain 08/30/2016   Sciatica, right side 12/12/2015   Right knee pain 12/12/2015   Morbid obesity (HCC) 08/12/2015   Lumbago 02/10/2014   Chronic anxiety 07/08/2013   Swelling of breast 04/08/2013   Chronic pain syndrome 03/11/2013   Port catheter in place 12/31/2012   Unspecified constipation 10/01/2012   Anemia 10/01/2012   Rectal bleeding 10/01/2012   Elevated alkaline phosphatase level 10/01/2012   MS (multiple sclerosis) (HCC) 09/02/2012   ANEMIA 04/12/2010   OTHER DYSPHAGIA 04/12/2010   12:04 PM, 03/27/21 Tereasa Coop, DPT Physical Therapy with [facility]  [phone] office   [facility] [address] [place], [place], [address] Phone: [phone]   Fax:  [phone]  Name: Melinda Moore MRN: [id number] Date of Birth: Nov 08, 1970

## 2021-03-29 ENCOUNTER — Ambulatory Visit (HOSPITAL_COMMUNITY): Payer: 59

## 2021-03-30 ENCOUNTER — Encounter (HOSPITAL_COMMUNITY): Payer: 59 | Admitting: Physical Therapy

## 2021-04-03 ENCOUNTER — Encounter (HOSPITAL_COMMUNITY): Payer: Self-pay | Admitting: Physical Therapy

## 2021-04-03 ENCOUNTER — Other Ambulatory Visit: Payer: Self-pay

## 2021-04-03 ENCOUNTER — Ambulatory Visit (HOSPITAL_COMMUNITY): Payer: 59 | Admitting: Physical Therapy

## 2021-04-03 DIAGNOSIS — M6281 Muscle weakness (generalized): Secondary | ICD-10-CM | POA: Diagnosis not present

## 2021-04-03 DIAGNOSIS — G8929 Other chronic pain: Secondary | ICD-10-CM | POA: Diagnosis not present

## 2021-04-03 DIAGNOSIS — R262 Difficulty in walking, not elsewhere classified: Secondary | ICD-10-CM | POA: Diagnosis not present

## 2021-04-03 DIAGNOSIS — M25561 Pain in right knee: Secondary | ICD-10-CM

## 2021-04-03 DIAGNOSIS — M5441 Lumbago with sciatica, right side: Secondary | ICD-10-CM | POA: Diagnosis not present

## 2021-04-03 NOTE — Therapy (Signed)
[place], [place], [address] Phone: [phone]   Fax:  [phone]  Physical Therapy Treatment  Patient Details  Name: Melinda Moore MRN: [id number] Date of Birth: 04/07/1971 Referring Provider (PT): Sallee Lange MD   Encounter Date: 04/03/2021   PT End of Session - 04/03/21 1144     Visit Number 6    Number of Visits 14    Date for PT Re-Evaluation 05/01/21    Authorization Type united healthcare  VL 60 ST/OT/PT - no auth, secondary [facility] medicare no VL or auth    Progress Note Due on Visit 10    PT Start Time 5701    PT Stop Time 1209    PT Time Calculation (min) 38 min    Activity Tolerance Patient tolerated treatment well;No increased pain    Behavior During Therapy WFL for tasks assessed/performed             Past Medical History:  Diagnosis Date   Anemia    Anxiety    Bell's palsy    Depression    DVT (deep venous thrombosis) ([place]) 09/02/2012   [place] on 02/13/2012 showed acute DVT in left calf veins and posterior tibial veins   Fibromyalgia    GERD (gastroesophageal reflux disease)    History of blood transfusion 2000   History of trigeminal neuralgia    HTN (hypertension)    patient denies was taking a blood pressure medication in early 2000 but it was migraines   Leukopenia    MS (multiple sclerosis) ([place]) 1997   Neuropathic pain    Peripheral edema    Port catheter in place 12/31/2012   Right bundle branch block     Past Surgical History:  Procedure Laterality Date   ABDOMINAL HYSTERECTOMY     CHOLECYSTECTOMY     ESOPHAGOGASTRODUODENOSCOPY  2011   Dr. Gala Romney. Possible cervical esophageal with status post disruption with passage of Maloney dilator, glycol esophageal erythema, antral erosions. Biopsy from the stomach revealed minimal chronic inactive inflammation but negative for H. pylori   LAPAROSCOPIC GASTRIC SLEEVE RESECTION N/A 04/17/2016   Procedure: LAPAROSCOPIC GASTRIC SLEEVE RESECTION  WITH UPPER ENDOSCOPY;  Surgeon: Excell Seltzer, MD;  Location: [facility];  Service: General;  Laterality: N/A;   PATELLA-FEMORAL ARTHROPLASTY Right 07/14/2019   Procedure: PATELLA-FEMORAL ARTHROPLASTY;  Surgeon: Marchia Bond, MD;  Location: [facility];  Service: Orthopedics;  Laterality: Right;   PORTACATH PLACEMENT     TUBAL LIGATION  2001    There were no vitals filed for this visit.   Subjective Assessment - 04/03/21 1134     Subjective States she is doing better. Sate sseh has 5/10 pain in her back and knee. States she found a topical at [employer] that seems to be helping. Reports 50% better since start of PT. States she does her exercises every other day    Currently in Pain? Yes    Pain Location Back    Pain Orientation Right    Pain Descriptors / Indicators Aching    Pain Radiating Towards to medial knee                [facility] PT Assessment - 04/03/21 0001       Assessment   Medical Diagnosis Right sided sciatica, R leg pain    Referring Provider (PT) Sallee Lange MD      Observation/Other Assessments   Focus on Therapeutic Outcomes (FOTO)  59% function   predicted 60% function  Ambulation/Gait   Ambulation/Gait Yes    Ambulation Distance (Feet) 392 Feet    Assistive device None    Gait Pattern Shuffle;Right foot flat;Decreased hip/knee flexion - right;Decreased hip/knee flexion - left;Decreased stride length    Ambulation Surface Level;Indoor    Gait velocity reduced    Gait Comments 2MW, hunched over with fatigue      Balance   Balance Assessed Yes      Static Standing Balance   Static Standing - Balance Support No upper extremity supported    Static Standing Balance -  Activities  Single Leg Stance - Right Leg;Single Leg Stance - Left Leg    Static Standing - Comment/# of Minutes SLS 1 second on either leg                           [facility] Adult PT Treatment/Exercise - 04/03/21 0001       Knee/Hip Exercises: Stretches   Active Hamstring  Stretch 3 reps;30 seconds;Both    Other Knee/Hip Stretches seated lumbar side bending 2  minutes total with 5 second holds on each side, lumbar flexion 5" holds, 2 minutes, lumbar rotation 4" holds 2 minutes B      Knee/Hip Exercises: Standing   Other Standing Knee Exercises x3 30" holds B 1 hand support    Other Standing Knee Exercises lumbar extension at wall 2x 5 10" holds      Knee/Hip Exercises: Seated   Sit to Sand 15 reps;without UE support   slow and controlled                    PT Education - 04/03/21 1145     Education Details on FOTO score, on current presentation, on POC    Person(s) Educated Patient    Methods Explanation    Comprehension Verbalized understanding              PT Short Term Goals - 04/03/21 1143       PT SHORT TERM GOAL #1   Title Patient will report at least 50% improvement in overall symptoms and/or function to demonstrate improved functional mobility    Baseline 50% better    Time 3    Period Weeks    Status Achieved    Target Date 03/13/21      PT SHORT TERM GOAL #2   Title Patient will be independent in self management strategies to improve quality of life and functional outcomes.    Time 3    Period Weeks    Status Achieved    Target Date 03/13/21      PT SHORT TERM GOAL #3   Title Patient will be able to walk at least 375 feet in 2 minutes to demonstrate improved LE strength    Time 3    Period Weeks    Status Achieved               PT Long Term Goals - 04/03/21 1148       PT LONG TERM GOAL #1   Title Patient will report at least 75% improvement in overall symptoms and/or function to demonstrate improved functional mobility    Time 6    Period Weeks    Status On-going      PT LONG TERM GOAL #2   Title Patient will be able to stand for at least 15 seconds on either leg in single leg stance to improve static balance  Time 6    Period Weeks    Status On-going      PT LONG TERM GOAL #3   Title  Patient will be able to ambulate with fair posture (not dragging feet and reduced slumped posture) for entire 2 minutes during 2 minute walk test to improve functional mobility and posture.    Time 6    Period Weeks    Status On-going      PT LONG TERM GOAL #4   Title Patient will meet predicted FOTO score to demonstrate improved overall function.    Time 6    Period Weeks    Status On-going                   Plan - 04/03/21 1146     Clinical Impression Statement Patient present for a progress note on this date. Overall Patient has made good progress and has met all short term goals at this time. Extending POC to continue with current plan and finish visits that were no used secondary to inability to come in to PT for a couple of weeks. Continued weakness and endurance difficulties noted on this date. Will continue with current POC as tolerated.    Personal Factors and Comorbidities Comorbidity 1;Comorbidity 2    Comorbidities R TKA, MS    Examination-Activity Limitations Bend;Squat;Stairs;Stand;Locomotion Level;Lift;Transfers    Examination-Participation Restrictions Meal Prep;Community Activity;Shop;Cleaning    Stability/Clinical Decision Making Stable/Uncomplicated    Rehab Potential Good    PT Frequency 2x / week    PT Duration 4 weeks    PT Treatment/Interventions ADLs/Self Care Home Management;Aquatic Therapy;Cryotherapy;Canalith Repostioning;Electrical Stimulation;Moist Heat;Traction;Balance training;Therapeutic exercise;Therapeutic activities;Stair training;Gait training;Neuromuscular re-education;Patient/family education;Manual techniques;Dry needling;Joint Manipulations;Passive range of motion    PT Next Visit Plan walking endurace and posture, LE strengthening and stretches/ ROM, static balance, functional strength - address knee and back pain as indicated    PT Home Exercise Plan self mobilization with rolling stick; 9/20: STS, heel raise, lumbar extension, POE/pressup,  bridge, thomas stretch; STS Trunk ROT, flexion, side flexion, STS slow; 10/13 clamshells, SLR; 10/17 LAQs; 10/24 lumbar extension, SLS    Consulted and Agree with Plan of Care Patient             Patient will benefit from skilled therapeutic intervention in order to improve the following deficits and impairments:  Pain, Abnormal gait, Decreased endurance, Decreased activity tolerance, Decreased strength, Decreased mobility, Decreased balance, Decreased range of motion, Postural dysfunction, Improper body mechanics, Difficulty walking  Visit Diagnosis: Muscle weakness (generalized) - Plan: PT plan of care cert/re-cert  Difficulty in walking, not elsewhere classified - Plan: PT plan of care cert/re-cert  Right knee pain, unspecified chronicity - Plan: PT plan of care cert/re-cert  Chronic right-sided low back pain with right-sided sciatica - Plan: PT plan of care cert/re-cert     Problem List Patient Active Problem List   Diagnosis Date Noted   Acute hypoxemic respiratory failure due to COVID-19 ([place]) 02/06/2020   Pneumonia due to COVID-19 virus 02/04/2020   Elevated transaminase level 02/04/2020   Patellofemoral arthritis of right knee 07/14/2019   History of DVT in adulthood 06/23/2019   Right leg pain 03/12/2017   Gait abnormality 03/12/2017   Radiculopathy due to lumbar intervertebral disc disorder 09/21/2016   Spondylosis without myelopathy or radiculopathy, lumbar region 09/21/2016   History of anxiety 09/08/2016   Primary osteoarthritis of right knee 08/30/2016   Primary insomnia 08/30/2016   Myofascial pain 08/30/2016   Sciatica, right side 12/12/2015  Right knee pain 12/12/2015   Morbid obesity ([place]) 08/12/2015   Lumbago 02/10/2014   Chronic anxiety 07/08/2013   Swelling of breast 04/08/2013   Chronic pain syndrome 03/11/2013   Port catheter in place 12/31/2012   Unspecified constipation 10/01/2012   Anemia 10/01/2012   Rectal bleeding 10/01/2012   Elevated  alkaline phosphatase level 10/01/2012   MS (multiple sclerosis) ([place]) 09/02/2012   ANEMIA 04/12/2010   OTHER DYSPHAGIA 04/12/2010    12:11 PM, 04/03/21 Jerene Pitch, DPT Physical Therapy with [facility]  [phone] office   [place], [place], [address] Phone: [phone]   Fax:  [phone]  Name: Melinda Moore MRN: [id number] Date of Birth: 07-19-1970

## 2021-04-05 ENCOUNTER — Telehealth: Payer: Self-pay | Admitting: Family Medicine

## 2021-04-05 DIAGNOSIS — R5383 Other fatigue: Secondary | ICD-10-CM

## 2021-04-05 DIAGNOSIS — E785 Hyperlipidemia, unspecified: Secondary | ICD-10-CM

## 2021-04-05 DIAGNOSIS — E875 Hyperkalemia: Secondary | ICD-10-CM

## 2021-04-05 DIAGNOSIS — Z79899 Other long term (current) drug therapy: Secondary | ICD-10-CM

## 2021-04-05 NOTE — Telephone Encounter (Signed)
Patient has physical 12/9 and needing labs

## 2021-04-06 ENCOUNTER — Encounter (HOSPITAL_COMMUNITY): Payer: Self-pay

## 2021-04-06 ENCOUNTER — Other Ambulatory Visit: Payer: Self-pay

## 2021-04-06 ENCOUNTER — Ambulatory Visit (HOSPITAL_COMMUNITY): Payer: 59

## 2021-04-06 DIAGNOSIS — G8929 Other chronic pain: Secondary | ICD-10-CM | POA: Diagnosis not present

## 2021-04-06 DIAGNOSIS — M5441 Lumbago with sciatica, right side: Secondary | ICD-10-CM | POA: Diagnosis not present

## 2021-04-06 DIAGNOSIS — R262 Difficulty in walking, not elsewhere classified: Secondary | ICD-10-CM | POA: Diagnosis not present

## 2021-04-06 DIAGNOSIS — M25561 Pain in right knee: Secondary | ICD-10-CM | POA: Diagnosis not present

## 2021-04-06 DIAGNOSIS — M6281 Muscle weakness (generalized): Secondary | ICD-10-CM | POA: Diagnosis not present

## 2021-04-06 NOTE — Therapy (Signed)
[facility] [address] [place], [place], [address] Phone: [phone]   Fax:  [phone]  Physical Therapy Treatment  Patient Details  Name: Melinda Moore MRN: [id number] Date of Birth: Nov 15, 1970 Referring Provider (PT): Lilyan Punt MD   Encounter Date: 04/06/2021   PT End of Session - 04/06/21 1215     Visit Number 7    Number of Visits 14    Date for PT Re-Evaluation 05/01/21    Authorization Type united healthcare  VL 60 ST/OT/PT - no auth, secondary [facility] medicare no VL or auth    Progress Note Due on Visit 10    PT Start Time 1128    PT Stop Time 1208    PT Time Calculation (min) 40 min    Activity Tolerance Patient tolerated treatment well;No increased pain    Behavior During Therapy WFL for tasks assessed/performed             Past Medical History:  Diagnosis Date   Anemia    Anxiety    Bell's palsy    Depression    DVT (deep venous thrombosis) ([facility]) 09/02/2012   [place] on 02/13/2012 showed acute DVT in left calf veins and posterior tibial veins   Fibromyalgia    GERD (gastroesophageal reflux disease)    History of blood transfusion 2000   History of trigeminal neuralgia    HTN (hypertension)    patient denies was taking a blood pressure medication in early 2000 but it was migraines   Leukopenia    MS (multiple sclerosis) ([facility]) 1997   Neuropathic pain    Peripheral edema    Port catheter in place 12/31/2012   Right bundle branch block     Past Surgical History:  Procedure Laterality Date   ABDOMINAL HYSTERECTOMY     CHOLECYSTECTOMY     ESOPHAGOGASTRODUODENOSCOPY  2011   Dr. Jena Gauss. Possible cervical esophageal with status post disruption with passage of Maloney dilator, glycol esophageal erythema, antral erosions. Biopsy from the stomach revealed minimal chronic inactive inflammation but negative for H. pylori   LAPAROSCOPIC GASTRIC SLEEVE RESECTION N/A 04/17/2016   Procedure: LAPAROSCOPIC GASTRIC SLEEVE RESECTION  WITH UPPER ENDOSCOPY;  Surgeon: Glenna Fellows, MD;  Location: [facility];  Service: General;  Laterality: N/A;   PATELLA-FEMORAL ARTHROPLASTY Right 07/14/2019   Procedure: PATELLA-FEMORAL ARTHROPLASTY;  Surgeon: Teryl Lucy, MD;  Location: [facility];  Service: Orthopedics;  Laterality: Right;   PORTACATH PLACEMENT     TUBAL LIGATION  2001    There were no vitals filed for this visit.   Subjective Assessment - 04/06/21 1132     Subjective Pt stated she has a constant achey pain in lower back and knee.    Pertinent History MS    Patient Stated Goals to be able to stand up straight.    Currently in Pain? Yes    Pain Score 7     Pain Location Back   Back and Rt knee   Pain Orientation Lower    Pain Descriptors / Indicators Aching    Pain Type Chronic pain    Pain Radiating Towards to lateral calf and medial knee    Pain Onset More than a month ago    Pain Frequency Constant    Aggravating Factors  walking, standing    Pain Relieving Factors elevation, MHP  [facility] Adult PT Treatment/Exercise - 04/06/21 0001       Knee/Hip Exercises: Stretches   Hip Flexor Stretch Both;3 reps;30 seconds    Hip Flexor Stretch Limitations on step    Other Knee/Hip Stretches POE x2 min; prone press up 10x 10"    Other Knee/Hip Stretches standing lumbar extension 10x      Knee/Hip Exercises: Machines for Strengthening   Other Machine cybex back extension 2x 10 1 plate slow controlled      Knee/Hip Exercises: Standing   Functional Squat 10 reps    Gait Training 379ft, cueing for posture, heel to toe and foot clearance; limited by fatigue    Other Standing Knee Exercises RTB rows and extension 15x    Other Standing Knee Exercises Tandem stance 3x 30"      Knee/Hip Exercises: Seated   Sit to Sand 15 reps;without UE support   arms crossed over chest                      PT Short Term Goals - 04/03/21 1143       PT SHORT TERM GOAL  #1   Title Patient will report at least 50% improvement in overall symptoms and/or function to demonstrate improved functional mobility    Baseline 50% better    Time 3    Period Weeks    Status Achieved    Target Date 03/13/21      PT SHORT TERM GOAL #2   Title Patient will be independent in self management strategies to improve quality of life and functional outcomes.    Time 3    Period Weeks    Status Achieved    Target Date 03/13/21      PT SHORT TERM GOAL #3   Title Patient will be able to walk at least 375 feet in 2 minutes to demonstrate improved LE strength    Time 3    Period Weeks    Status Achieved               PT Long Term Goals - 04/03/21 1148       PT LONG TERM GOAL #1   Title Patient will report at least 75% improvement in overall symptoms and/or function to demonstrate improved functional mobility    Time 6    Period Weeks    Status On-going      PT LONG TERM GOAL #2   Title Patient will be able to stand for at least 15 seconds on either leg in single leg stance to improve static balance    Time 6    Period Weeks    Status On-going      PT LONG TERM GOAL #3   Title Patient will be able to ambulate with fair posture (not dragging feet and reduced slumped posture) for entire 2 minutes during 2 minute walk test to improve functional mobility and posture.    Time 6    Period Weeks    Status On-going      PT LONG TERM GOAL #4   Title Patient will meet predicted FOTO score to demonstrate improved overall function.    Time 6    Period Weeks    Status On-going                   Plan - 04/06/21 1215     Clinical Impression Statement Began session with , cueing for posture and foot clearance, no reports of increased  pain, was limited by fatigue wiht short duration seated rest break following.  Progressed functional and postural strengthening with additional exercises including theraband, back extension machine and functional  strengthening activities.  Pt reports pain reduced at EOS and presents with more erect posture.    Personal Factors and Comorbidities Comorbidity 1;Comorbidity 2    Comorbidities R TKA, MS    Examination-Activity Limitations Bend;Squat;Stairs;Stand;Locomotion Level;Lift;Transfers    Examination-Participation Restrictions Meal Prep;Community Activity;Shop;Cleaning    Stability/Clinical Decision Making Stable/Uncomplicated    Clinical Decision Making Low    Rehab Potential Good    PT Frequency 2x / week    PT Duration 4 weeks    PT Treatment/Interventions ADLs/Self Care Home Management;Aquatic Therapy;Cryotherapy;Canalith Repostioning;Electrical Stimulation;Moist Heat;Traction;Balance training;Therapeutic exercise;Therapeutic activities;Stair training;Gait training;Neuromuscular re-education;Patient/family education;Manual techniques;Dry needling;Joint Manipulations;Passive range of motion    PT Next Visit Plan walking endurace and posture, LE strengthening and stretches/ ROM, static balance, functional strength - address knee and back pain as indicated    PT Home Exercise Plan self mobilization with rolling stick; 9/20: STS, heel raise, lumbar extension, POE/pressup, bridge, thomas stretch; STS Trunk ROT, flexion, side flexion, STS slow; 10/13 clamshells, SLR; 10/17 LAQs; 10/24 lumbar extension, SLS    Consulted and Agree with Plan of Care Patient             Patient will benefit from skilled therapeutic intervention in order to improve the following deficits and impairments:  Pain, Abnormal gait, Decreased endurance, Decreased activity tolerance, Decreased strength, Decreased mobility, Decreased balance, Decreased range of motion, Postural dysfunction, Improper body mechanics, Difficulty walking  Visit Diagnosis: Muscle weakness (generalized)  Difficulty in walking, not elsewhere classified  Right knee pain, unspecified chronicity  Chronic right-sided low back pain with right-sided  sciatica     Problem List Patient Active Problem List   Diagnosis Date Noted   Acute hypoxemic respiratory failure due to COVID-19 ([facility]) 02/06/2020   Pneumonia due to COVID-19 virus 02/04/2020   Elevated transaminase level 02/04/2020   Patellofemoral arthritis of right knee 07/14/2019   History of DVT in adulthood 06/23/2019   Right leg pain 03/12/2017   Gait abnormality 03/12/2017   Radiculopathy due to lumbar intervertebral disc disorder 09/21/2016   Spondylosis without myelopathy or radiculopathy, lumbar region 09/21/2016   History of anxiety 09/08/2016   Primary osteoarthritis of right knee 08/30/2016   Primary insomnia 08/30/2016   Myofascial pain 08/30/2016   Sciatica, right side 12/12/2015   Right knee pain 12/12/2015   Morbid obesity ([facility]) 08/12/2015   Lumbago 02/10/2014   Chronic anxiety 07/08/2013   Swelling of breast 04/08/2013   Chronic pain syndrome 03/11/2013   Port catheter in place 12/31/2012   Unspecified constipation 10/01/2012   Anemia 10/01/2012   Rectal bleeding 10/01/2012   Elevated alkaline phosphatase level 10/01/2012   MS (multiple sclerosis) ([facility]) 09/02/2012   ANEMIA 04/12/2010   OTHER DYSPHAGIA 04/12/2010   Becky Sax, LPTA/CLT; CBIS [phone]  Juel Burrow, PTA 04/06/2021, 3:55 PM  [place] [facility] [address] [place], [place], [address] Phone: [phone]   Fax:  [phone]  Name: Melinda Moore MRN: [id number] Date of Birth: 02-02-1971

## 2021-04-07 NOTE — Telephone Encounter (Signed)
Blood work ordered in Epic. Patient notified. 

## 2021-04-10 ENCOUNTER — Other Ambulatory Visit: Payer: Self-pay | Admitting: Family Medicine

## 2021-04-11 ENCOUNTER — Other Ambulatory Visit: Payer: Self-pay

## 2021-04-11 ENCOUNTER — Ambulatory Visit (HOSPITAL_COMMUNITY): Payer: 59 | Attending: Family Medicine

## 2021-04-11 ENCOUNTER — Other Ambulatory Visit: Payer: Self-pay | Admitting: Family Medicine

## 2021-04-11 ENCOUNTER — Encounter (HOSPITAL_COMMUNITY): Payer: Self-pay

## 2021-04-11 DIAGNOSIS — M6281 Muscle weakness (generalized): Secondary | ICD-10-CM | POA: Insufficient documentation

## 2021-04-11 DIAGNOSIS — M25561 Pain in right knee: Secondary | ICD-10-CM | POA: Insufficient documentation

## 2021-04-11 DIAGNOSIS — M5441 Lumbago with sciatica, right side: Secondary | ICD-10-CM | POA: Diagnosis not present

## 2021-04-11 DIAGNOSIS — M25661 Stiffness of right knee, not elsewhere classified: Secondary | ICD-10-CM | POA: Insufficient documentation

## 2021-04-11 DIAGNOSIS — R2689 Other abnormalities of gait and mobility: Secondary | ICD-10-CM | POA: Diagnosis not present

## 2021-04-11 DIAGNOSIS — G8929 Other chronic pain: Secondary | ICD-10-CM | POA: Insufficient documentation

## 2021-04-11 DIAGNOSIS — R262 Difficulty in walking, not elsewhere classified: Secondary | ICD-10-CM | POA: Insufficient documentation

## 2021-04-11 NOTE — Therapy (Signed)
[facility] [address] [place], [place], [address] Phone: [phone]   Fax:  [phone]  Physical Therapy Treatment  Patient Details  Name: Melinda Moore MRN: [id number] Date of Birth: Mar 10, 1971 Referring Provider (PT): Lilyan Punt MD   Encounter Date: 04/11/2021   PT End of Session - 04/11/21 1406     Visit Number 8    Number of Visits 14    Date for PT Re-Evaluation 05/01/21    Authorization Type united healthcare  VL 60 ST/OT/PT - no auth, secondary [facility] medicare no VL or auth    Progress Note Due on Visit 10    PT Start Time 1401    PT Stop Time 1440    PT Time Calculation (min) 39 min    Activity Tolerance Patient tolerated treatment well;No increased pain;Patient limited by fatigue   Pain reduced to 4/10; fatigue level 10/10   Behavior During Therapy [facility] for tasks assessed/performed             Past Medical History:  Diagnosis Date   Anemia    Anxiety    Bell's palsy    Depression    DVT (deep venous thrombosis) (HCC) 09/02/2012   [place] on 02/13/2012 showed acute DVT in left calf veins and posterior tibial veins   Fibromyalgia    GERD (gastroesophageal reflux disease)    History of blood transfusion 2000   History of trigeminal neuralgia    HTN (hypertension)    patient denies was taking a blood pressure medication in early 2000 but it was migraines   Leukopenia    MS (multiple sclerosis) (HCC) 1997   Neuropathic pain    Peripheral edema    Port catheter in place 12/31/2012   Right bundle branch block     Past Surgical History:  Procedure Laterality Date   ABDOMINAL HYSTERECTOMY     CHOLECYSTECTOMY     ESOPHAGOGASTRODUODENOSCOPY  2011   Dr. Jena Gauss. Possible cervical esophageal with status post disruption with passage of Maloney dilator, glycol esophageal erythema, antral erosions. Biopsy from the stomach revealed minimal chronic inactive inflammation but negative for H. pylori   LAPAROSCOPIC GASTRIC SLEEVE  RESECTION N/A 04/17/2016   Procedure: LAPAROSCOPIC GASTRIC SLEEVE RESECTION WITH UPPER ENDOSCOPY;  Surgeon: Glenna Fellows, MD;  Location: [facility];  Service: General;  Laterality: N/A;   PATELLA-FEMORAL ARTHROPLASTY Right 07/14/2019   Procedure: PATELLA-FEMORAL ARTHROPLASTY;  Surgeon: Teryl Lucy, MD;  Location: [facility];  Service: Orthopedics;  Laterality: Right;   PORTACATH PLACEMENT     TUBAL LIGATION  2001    There were no vitals filed for this visit.   Subjective Assessment - 04/11/21 1404     Subjective Pt stated she is tired at entrance, stated she has done a lot of shopping with increased pain LBP and Rt knee.    Pertinent History MS    Patient Stated Goals to be able to stand up straight.    Currently in Pain? Yes    Pain Score 8     Pain Location Back   Back and Rt knee   Pain Orientation Lower    Pain Descriptors / Indicators Aching    Pain Radiating Towards to lateral calf burning    Pain Onset More than a month ago    Pain Frequency Constant    Aggravating Factors  walking, standing    Pain Relieving Factors elevation, MHP  [facility] Adult PT Treatment/Exercise - 04/11/21 0001       Knee/Hip Exercises: Stretches   Other Knee/Hip Stretches POE x2 min; prone press up 10x 10"    Other Knee/Hip Stretches standing lumbar extension 10x      Knee/Hip Exercises: Seated   Sit to Sand 10 reps;without UE support   arms crossed over crest     Knee/Hip Exercises: Supine   Bridges 10 reps    Bridges Limitations 3" hold; 2 sets 1)NBOS then 2) WBOS      Knee/Hip Exercises: Prone   Hip Extension Both;5 reps    Hip Extension Limitations tactile cueing to reduce trunk rotation    Other Prone Exercises heel squeeze 10x 5"                       PT Short Term Goals - 04/03/21 1143       PT SHORT TERM GOAL #1   Title Patient will report at least 50% improvement in overall symptoms and/or function to demonstrate  improved functional mobility    Baseline 50% better    Time 3    Period Weeks    Status Achieved    Target Date 03/13/21      PT SHORT TERM GOAL #2   Title Patient will be independent in self management strategies to improve quality of life and functional outcomes.    Time 3    Period Weeks    Status Achieved    Target Date 03/13/21      PT SHORT TERM GOAL #3   Title Patient will be able to walk at least 375 feet in 2 minutes to demonstrate improved LE strength    Time 3    Period Weeks    Status Achieved               PT Long Term Goals - 04/03/21 1148       PT LONG TERM GOAL #1   Title Patient will report at least 75% improvement in overall symptoms and/or function to demonstrate improved functional mobility    Time 6    Period Weeks    Status On-going      PT LONG TERM GOAL #2   Title Patient will be able to stand for at least 15 seconds on either leg in single leg stance to improve static balance    Time 6    Period Weeks    Status On-going      PT LONG TERM GOAL #3   Title Patient will be able to ambulate with fair posture (not dragging feet and reduced slumped posture) for entire 2 minutes during 2 minute walk test to improve functional mobility and posture.    Time 6    Period Weeks    Status On-going      PT LONG TERM GOAL #4   Title Patient will meet predicted FOTO score to demonstrate improved overall function.    Time 6    Period Weeks    Status On-going                   Plan - 04/11/21 1422     Clinical Impression Statement Held gait this session due to increased fatigue and pain at entrance.  Session focus on spinal mobility for postural strengthening and exercises for pain reduction.  Attempted prone SLR, noted gluteal weakness and required cueing to reduce rotation of trunk, improved mechanics noted with heel squeeze.  EOS  pt reports pain reduced to 4/10, presents with erect posture, fatigue level high thorugh session.  Increased  duration with rest breaks this session.    Personal Factors and Comorbidities Comorbidity 1;Comorbidity 2    Comorbidities R TKA, MS    Examination-Activity Limitations Bend;Squat;Stairs;Stand;Locomotion Level;Lift;Transfers    Examination-Participation Restrictions Meal Prep;Community Activity;Shop;Cleaning    Stability/Clinical Decision Making Stable/Uncomplicated    Clinical Decision Making Low    Rehab Potential Good    PT Frequency 2x / week    PT Duration 4 weeks    PT Treatment/Interventions ADLs/Self Care Home Management;Aquatic Therapy;Cryotherapy;Canalith Repostioning;Electrical Stimulation;Moist Heat;Traction;Balance training;Therapeutic exercise;Therapeutic activities;Stair training;Gait training;Neuromuscular re-education;Patient/family education;Manual techniques;Dry needling;Joint Manipulations;Passive range of motion    PT Next Visit Plan walking endurace and posture, LE strengthening and stretches/ ROM, static balance, functional strength - address knee and back pain as indicated    PT Home Exercise Plan self mobilization with rolling stick; 9/20: STS, heel raise, lumbar extension, POE/pressup, bridge, thomas stretch; STS Trunk ROT, flexion, side flexion, STS slow; 10/13 clamshells, SLR; 10/17 LAQs; 10/24 lumbar extension, SLS 11/1: heel squeeze    Consulted and Agree with Plan of Care Patient             Patient will benefit from skilled therapeutic intervention in order to improve the following deficits and impairments:  Pain, Abnormal gait, Decreased endurance, Decreased activity tolerance, Decreased strength, Decreased mobility, Decreased balance, Decreased range of motion, Postural dysfunction, Improper body mechanics, Difficulty walking  Visit Diagnosis: Muscle weakness (generalized)  Difficulty in walking, not elsewhere classified  Right knee pain, unspecified chronicity  Chronic right-sided low back pain with right-sided sciatica  Other abnormalities of gait  and mobility  Stiffness of right knee, not elsewhere classified     Problem List Patient Active Problem List   Diagnosis Date Noted   Acute hypoxemic respiratory failure due to COVID-19 (HCC) 02/06/2020   Pneumonia due to COVID-19 virus 02/04/2020   Elevated transaminase level 02/04/2020   Patellofemoral arthritis of right knee 07/14/2019   History of DVT in adulthood 06/23/2019   Right leg pain 03/12/2017   Gait abnormality 03/12/2017   Radiculopathy due to lumbar intervertebral disc disorder 09/21/2016   Spondylosis without myelopathy or radiculopathy, lumbar region 09/21/2016   History of anxiety 09/08/2016   Primary osteoarthritis of right knee 08/30/2016   Primary insomnia 08/30/2016   Myofascial pain 08/30/2016   Sciatica, right side 12/12/2015   Right knee pain 12/12/2015   Morbid obesity (HCC) 08/12/2015   Lumbago 02/10/2014   Chronic anxiety 07/08/2013   Swelling of breast 04/08/2013   Chronic pain syndrome 03/11/2013   Port catheter in place 12/31/2012   Unspecified constipation 10/01/2012   Anemia 10/01/2012   Rectal bleeding 10/01/2012   Elevated alkaline phosphatase level 10/01/2012   MS (multiple sclerosis) (HCC) 09/02/2012   ANEMIA 04/12/2010   OTHER DYSPHAGIA 04/12/2010   Becky Sax, LPTA/CLT; CBIS [phone]  Juel Burrow, PTA 04/11/2021, 2:44 PM  [facility] [address] [place], [place], [address] Phone: [phone]   Fax:  [phone]  Name: SKYLEE BAIRD MRN: [id number] Date of Birth: 1970/08/21

## 2021-04-13 ENCOUNTER — Encounter (HOSPITAL_COMMUNITY): Payer: 59

## 2021-04-17 ENCOUNTER — Encounter: Payer: Self-pay | Admitting: Family Medicine

## 2021-04-17 ENCOUNTER — Ambulatory Visit (HOSPITAL_COMMUNITY): Payer: 59

## 2021-04-17 DIAGNOSIS — Z1211 Encounter for screening for malignant neoplasm of colon: Secondary | ICD-10-CM

## 2021-04-18 ENCOUNTER — Encounter (HOSPITAL_COMMUNITY): Payer: Self-pay | Admitting: Physical Therapy

## 2021-04-18 ENCOUNTER — Other Ambulatory Visit: Payer: Self-pay

## 2021-04-18 ENCOUNTER — Ambulatory Visit (HOSPITAL_COMMUNITY): Payer: 59 | Admitting: Physical Therapy

## 2021-04-18 DIAGNOSIS — M6281 Muscle weakness (generalized): Secondary | ICD-10-CM | POA: Diagnosis not present

## 2021-04-18 DIAGNOSIS — G8929 Other chronic pain: Secondary | ICD-10-CM

## 2021-04-18 DIAGNOSIS — R2689 Other abnormalities of gait and mobility: Secondary | ICD-10-CM | POA: Diagnosis not present

## 2021-04-18 DIAGNOSIS — R262 Difficulty in walking, not elsewhere classified: Secondary | ICD-10-CM

## 2021-04-18 DIAGNOSIS — M25661 Stiffness of right knee, not elsewhere classified: Secondary | ICD-10-CM | POA: Diagnosis not present

## 2021-04-18 DIAGNOSIS — M5441 Lumbago with sciatica, right side: Secondary | ICD-10-CM | POA: Diagnosis not present

## 2021-04-18 DIAGNOSIS — M25561 Pain in right knee: Secondary | ICD-10-CM

## 2021-04-18 NOTE — Addendum Note (Signed)
Addended by: Marlowe Shores on: 04/18/2021 08:16 AM   Modules accepted: Orders

## 2021-04-18 NOTE — Telephone Encounter (Signed)
Nurses-please go ahead with referral to GI for colonoscopy Dr Rourke's office  Please inform patient it can take a few weeks but if she does not hear anything in the next couple weeks she should call their office

## 2021-04-18 NOTE — Therapy (Signed)
[facility] [address] [place], [place], [address] Phone: [phone]   Fax:  [phone]  Physical Therapy Treatment and Progress Note and Discharge Note  Patient Details  Name: Melinda Moore MRN: [id number] Date of Birth: [birth year] Referring Provider (PT): Sallee Lange MD  PHYSICAL THERAPY DISCHARGE SUMMARY  Visits from Start of Care: 9  Current functional level related to goals / functional outcomes: See below   Remaining deficits: See below   Education / Equipment: See below  Patient agrees to discharge. Patient goals were partially met. Patient is being discharged due to a change in medical status.    Progress Note Reporting Period 02/20/21 to 04/18/21  See note below for Objective Data and Assessment of Progress/Goals.      Encounter Date: 04/18/2021   PT End of Session - 04/18/21 1357     Visit Number 9    Number of Visits 14    Date for PT Re-Evaluation 05/01/21    Authorization Type united healthcare  VL 60 ST/OT/PT - no auth, secondary [facility] medicare no VL or auth    Progress Note Due on Visit 10    PT Start Time 1400    PT Stop Time 1430    PT Time Calculation (min) 30 min    Activity Tolerance Patient tolerated treatment well;No increased pain;Patient limited by fatigue   Pain reduced to 4/10; fatigue level 10/10   Behavior During Therapy [facility] for tasks assessed/performed             Past Medical History:  Diagnosis Date   Anemia    Anxiety    Bell's palsy    Depression    DVT (deep venous thrombosis) ([place]) 09/02/2012   [place] on 02/13/2012 showed acute DVT in left calf veins and posterior tibial veins   Fibromyalgia    GERD (gastroesophageal reflux disease)    History of blood transfusion 2000   History of trigeminal neuralgia    HTN (hypertension)    patient denies was taking a blood pressure medication in early 2000 but it was migraines   Leukopenia    MS (multiple sclerosis) ([place]) 1997    Neuropathic pain    Peripheral edema    Port catheter in place 12/31/2012   Right bundle branch block     Past Surgical History:  Procedure Laterality Date   ABDOMINAL HYSTERECTOMY     CHOLECYSTECTOMY     ESOPHAGOGASTRODUODENOSCOPY  2011   Dr. Gala Romney. Possible cervical esophageal with status post disruption with passage of Maloney dilator, glycol esophageal erythema, antral erosions. Biopsy from the stomach revealed minimal chronic inactive inflammation but negative for H. pylori   LAPAROSCOPIC GASTRIC SLEEVE RESECTION N/A 04/17/2016   Procedure: LAPAROSCOPIC GASTRIC SLEEVE RESECTION WITH UPPER ENDOSCOPY;  Surgeon: Excell Seltzer, MD;  Location: [facility];  Service: General;  Laterality: N/A;   PATELLA-FEMORAL ARTHROPLASTY Right 07/14/2019   Procedure: PATELLA-FEMORAL ARTHROPLASTY;  Surgeon: Marchia Bond, MD;  Location: [facility];  Service: Orthopedics;  Laterality: Right;   PORTACATH PLACEMENT     TUBAL LIGATION  2001    There were no vitals filed for this visit.   Subjective Assessment - 04/18/21 1404     Subjective States that last couple of days her feet having been really heavy and she tries to pick up her legs but they just feel really dragging.    Pertinent History MS    Patient Stated Goals to be able to stand up straight.    Currently  in Pain? Yes    Pain Score 8     Pain Location Back    Pain Descriptors / Indicators Burning    Pain Type Chronic pain    Pain Radiating Towards lateral leg right    Pain Onset More than a month ago                [facility] PT Assessment - 04/18/21 0001       Assessment   Medical Diagnosis Right sided sciatica, R leg pain    Referring Provider (PT) Sallee Lange MD      Observation/Other Assessments   Focus on Therapeutic Outcomes (FOTO)  60% function predicted 60% function      Ambulation/Gait   Ambulation/Gait Yes    Ambulation Distance (Feet) 316 Feet    Assistive device None    Gait Pattern Shuffle;Right foot flat;Decreased  hip/knee flexion - right;Decreased hip/knee flexion - left;Decreased stride length    Ambulation Surface Level;Indoor    Gait velocity reduced    Gait Comments 2MW, hunched over with fatigue, dragging feet                           [facility] Adult PT Treatment/Exercise - 04/18/21 0001       Knee/Hip Exercises: Seated   Other Seated Knee/Hip Exercises lumbar flexion/extension 3 minutes, side bending 3 minutes B, rotation 3 minutes                     PT Education - 04/18/21 1411     Education Details on FOTO score, current presentation, HEP and POC. on current symptoms and following up with MD.    Terence Lux) Educated Patient    Methods Explanation    Comprehension Verbalized understanding              PT Short Term Goals - 04/18/21 1405       PT SHORT TERM GOAL #1   Title Patient will report at least 50% improvement in overall symptoms and/or function to demonstrate improved functional mobility    Baseline 60% better    Time 3    Period Weeks    Status Achieved    Target Date 03/13/21      PT SHORT TERM GOAL #2   Title Patient will be independent in self management strategies to improve quality of life and functional outcomes.    Time 3    Period Weeks    Status Achieved    Target Date 03/13/21      PT SHORT TERM GOAL #3   Title Patient will be able to walk at least 375 feet in 2 minutes to demonstrate improved LE strength    Baseline 316    Time 3    Period Weeks    Status Not Met               PT Long Term Goals - 04/18/21 1409       PT LONG TERM GOAL #1   Title Patient will report at least 75% improvement in overall symptoms and/or function to demonstrate improved functional mobility    Time 6    Period Weeks    Status On-going      PT LONG TERM GOAL #2   Title Patient will be able to stand for at least 15 seconds on either leg in single leg stance to improve static balance    Time 6    Period Weeks  Status On-going       PT LONG TERM GOAL #3   Title Patient will be able to ambulate with fair posture (not dragging feet and reduced slumped posture) for entire 2 minutes during 2 minute walk test to improve functional mobility and posture.    Baseline currently dragging feet    Time 6    Period Weeks    Status On-going      PT LONG TERM GOAL #4   Title Patient will meet predicted FOTO score to demonstrate improved overall function.    Time 6    Period Weeks    Status Achieved                   Plan - 04/18/21 1357     Clinical Impression Statement Patient with regression of function noted on this date, increased right eye muscle twitching with exertion and requiring rest with 2-minute walk test which she previously did not require and with off feeling afterwards (jokingly said she may pass out). Answered all questions and discussed regression and possibility of MS episode coming on. Instructed patient to follow up with neurologist and will discontinue therapy at this time until patient follows up with neurologist.    Personal Factors and Comorbidities Comorbidity 1;Comorbidity 2    Comorbidities R TKA, MS    Examination-Activity Limitations Bend;Squat;Stairs;Stand;Locomotion Level;Lift;Transfers    Examination-Participation Restrictions Meal Prep;Community Activity;Shop;Cleaning    Stability/Clinical Decision Making Stable/Uncomplicated    Rehab Potential Good    PT Frequency 2x / week    PT Duration 4 weeks    PT Treatment/Interventions ADLs/Self Care Home Management;Aquatic Therapy;Cryotherapy;Canalith Repostioning;Electrical Stimulation;Moist Heat;Traction;Balance training;Therapeutic exercise;Therapeutic activities;Stair training;Gait training;Neuromuscular re-education;Patient/family education;Manual techniques;Dry needling;Joint Manipulations;Passive range of motion    PT Next Visit Plan DC to HEP    PT Home Exercise Plan self mobilization with rolling stick; 9/20: STS, heel raise, lumbar  extension, POE/pressup, bridge, thomas stretch; STS Trunk ROT, flexion, side flexion, STS slow; 10/13 clamshells, SLR; 10/17 LAQs; 10/24 lumbar extension, SLS 11/1: heel squeeze    Consulted and Agree with Plan of Care Patient             Patient will benefit from skilled therapeutic intervention in order to improve the following deficits and impairments:  Pain, Abnormal gait, Decreased endurance, Decreased activity tolerance, Decreased strength, Decreased mobility, Decreased balance, Decreased range of motion, Postural dysfunction, Improper body mechanics, Difficulty walking  Visit Diagnosis: Muscle weakness (generalized)  Difficulty in walking, not elsewhere classified  Right knee pain, unspecified chronicity  Chronic right-sided low back pain with right-sided sciatica     Problem List Patient Active Problem List   Diagnosis Date Noted   Acute hypoxemic respiratory failure due to COVID-19 ([place]) 02/06/2020   Pneumonia due to COVID-19 virus 02/04/2020   Elevated transaminase level 02/04/2020   Patellofemoral arthritis of right knee 07/14/2019   History of DVT in adulthood 06/23/2019   Right leg pain 03/12/2017   Gait abnormality 03/12/2017   Radiculopathy due to lumbar intervertebral disc disorder 09/21/2016   Spondylosis without myelopathy or radiculopathy, lumbar region 09/21/2016   History of anxiety 09/08/2016   Primary osteoarthritis of right knee 08/30/2016   Primary insomnia 08/30/2016   Myofascial pain 08/30/2016   Sciatica, right side 12/12/2015   Right knee pain 12/12/2015   Morbid obesity ([place]) 08/12/2015   Lumbago 02/10/2014   Chronic anxiety 07/08/2013   Swelling of breast 04/08/2013   Chronic pain syndrome 03/11/2013   Port catheter in place 12/31/2012  Unspecified constipation 10/01/2012   Anemia 10/01/2012   Rectal bleeding 10/01/2012   Elevated alkaline phosphatase level 10/01/2012   MS (multiple sclerosis) ([place]) 09/02/2012   ANEMIA 04/12/2010    OTHER DYSPHAGIA 04/12/2010   2:36 PM, 04/18/21 Jerene Pitch, DPT Physical Therapy with [facility]  [phone] office   [facility] [address] [place], [place], [address] Phone: [phone]   Fax:  [phone]  Name: Melinda Moore MRN: [id number] Date of Birth: 1970-10-27

## 2021-04-20 ENCOUNTER — Encounter (HOSPITAL_COMMUNITY): Payer: 59 | Admitting: Physical Therapy

## 2021-04-24 ENCOUNTER — Encounter: Payer: Self-pay | Admitting: Internal Medicine

## 2021-04-25 ENCOUNTER — Encounter (HOSPITAL_COMMUNITY): Payer: 59 | Admitting: Physical Therapy

## 2021-04-27 ENCOUNTER — Encounter (HOSPITAL_COMMUNITY): Payer: 59

## 2021-05-08 ENCOUNTER — Other Ambulatory Visit: Payer: Self-pay | Admitting: Family Medicine

## 2021-05-09 ENCOUNTER — Encounter (HOSPITAL_COMMUNITY): Payer: 59 | Admitting: Physical Therapy

## 2021-05-11 ENCOUNTER — Encounter (HOSPITAL_COMMUNITY): Payer: 59 | Admitting: Physical Therapy

## 2021-05-19 ENCOUNTER — Encounter: Payer: 59 | Admitting: Nurse Practitioner

## 2021-05-24 ENCOUNTER — Ambulatory Visit (INDEPENDENT_AMBULATORY_CARE_PROVIDER_SITE_OTHER): Payer: 59 | Admitting: Family Medicine

## 2021-05-24 ENCOUNTER — Other Ambulatory Visit: Payer: Self-pay

## 2021-05-24 VITALS — BP 114/70 | HR 86 | Temp 98.6°F | Ht 69.0 in | Wt 260.0 lb

## 2021-05-24 DIAGNOSIS — E785 Hyperlipidemia, unspecified: Secondary | ICD-10-CM

## 2021-05-24 DIAGNOSIS — Z79891 Long term (current) use of opiate analgesic: Secondary | ICD-10-CM | POA: Diagnosis not present

## 2021-05-24 DIAGNOSIS — R5383 Other fatigue: Secondary | ICD-10-CM | POA: Diagnosis not present

## 2021-05-24 DIAGNOSIS — G35 Multiple sclerosis: Secondary | ICD-10-CM | POA: Diagnosis not present

## 2021-05-24 MED ORDER — PANTOPRAZOLE SODIUM 40 MG PO TBEC: DELAYED_RELEASE_TABLET | ORAL | 1 refills | Status: DC

## 2021-05-24 MED ORDER — HYDROMORPHONE HCL 4 MG PO TABS: ORAL_TABLET | ORAL | 0 refills | Status: DC

## 2021-05-24 MED ORDER — TORSEMIDE 20 MG PO TABS: ORAL_TABLET | ORAL | 5 refills | Status: DC

## 2021-05-24 MED ORDER — POTASSIUM CHLORIDE CRYS ER 20 MEQ PO TBCR: EXTENDED_RELEASE_TABLET | ORAL | 5 refills | Status: DC

## 2021-05-24 NOTE — Progress Notes (Signed)
Subjective:    Patient ID: Melinda Moore, female    DOB: 27-Aug-1970, 50 y.o.   MRN: [id number]  HPI This patient was seen today for chronic pain  The medication list was reviewed and updated.  Location of Pain for which the patient has been treated with regarding narcotics: Right knee  Onset of this pain: has been treating for knee pain for 5 years   -Compliance with medication: Yes  - Number patient states they take daily: 3 tablets per day sometimes may have to take 1/2 tablet in middle of night.  -when was the last dose patient took? Around 10:30 -11 am   The patient was advised the importance of maintaining medication and not using illegal substances with these.  Here for refills and follow up  The patient was educated that we can provide 3 monthly scripts for their medication, it is their responsibility to follow the instructions.  Side effects or complications from medications: None  Patient is aware that pain medications are meant to minimize the severity of the pain to allow their pain levels to improve to allow for better function. They are aware of that pain medications cannot totally remove their pain.  Due for UDT ( at least once per year) : No  Scale of 1 to 10 ( 1 is least 10 is most) Your pain level without the medicine: 9 Your pain level with medication : 6  Scale 1 to 10 ( 1-helps very little, 10 helps very well) How well does your pain medication reduce your pain so you can function better through out the day? 8  Quality of the pain: burning ache with sciatica, body also aches all over with MS  Persistence of the pain: all the time  Modifying factors: worse with activity  MS (multiple sclerosis) (HCC)  Encounter for long-term opiate analgesic use - Plan: HYDROmorphone (DILAUDID) 4 MG tablet, HYDROmorphone (DILAUDID) 4 MG tablet, HYDROmorphone (DILAUDID) 4 MG tablet  Hyperlipidemia, unspecified hyperlipidemia type - Plan: HYDROmorphone (DILAUDID) 4 MG  tablet, HYDROmorphone (DILAUDID) 4 MG tablet, HYDROmorphone (DILAUDID) 4 MG tablet  Other fatigue - Plan: HYDROmorphone (DILAUDID) 4 MG tablet, HYDROmorphone (DILAUDID) 4 MG tablet, HYDROmorphone (DILAUDID) 4 MG tablet  Morbid obesity (HCC)  We did discuss GLP1        Review of Systems     Objective:   Physical Exam  General-in no acute distress Eyes-no discharge Lungs-respiratory rate normal, CTA CV-no murmurs,RRR Extremities skin warm dry no edema Neuro grossly normal Behavior normal, alert       Assessment & Plan:  1. Encounter for long-term opiate analgesic use The patient was seen in followup for chronic pain. A review over at their current pain status was discussed. Drug registry was checked. Prescriptions were given.  Regular follow-up recommended. Discussion was held regarding the importance of compliance with medication as well as pain medication contract.  Patient was informed that medication may cause drowsiness and should not be combined  with other medications/alcohol or street drugs. If the patient feels medication is causing altered alertness then do not drive or operate dangerous equipment.  Should be noted that the patient appears to be meeting appropriate use of opioids and response.  Evidenced by improved function and decent pain control without significant side effects and no evidence of overt aberrancy issues.  Upon discussion with the patient today they understand that opioid therapy is optional and they feel that the pain has been refractory to reasonable conservative measures and is  significant and affecting quality of life enough to warrant ongoing therapy and wishes to continue opioids.  Refills were provided.  - HYDROmorphone (DILAUDID) 4 MG tablet; Take 1/2 to 1 tablet qid prn , maximum use per day is 3-1/2 tablets  Dispense: 105 tablet; Refill: 0 - HYDROmorphone (DILAUDID) 4 MG tablet; Take one half to 1 tablet 4 times daily as needed maximum  3-1/2 tablets daily  Dispense: 105 tablet; Refill: 0 - HYDROmorphone (DILAUDID) 4 MG tablet; Take 1/2 to 1 tablet TID - QID PRN maximum 3-1/2 tablets  Dispense: 105 tablet; Refill: 0  2. Hyperlipidemia, unspecified hyperlipidemia type Diet approach - HYDROmorphone (DILAUDID) 4 MG tablet; Take 1/2 to 1 tablet qid prn , maximum use per day is 3-1/2 tablets  Dispense: 105 tablet; Refill: 0 - HYDROmorphone (DILAUDID) 4 MG tablet; Take one half to 1 tablet 4 times daily as needed maximum 3-1/2 tablets daily  Dispense: 105 tablet; Refill: 0 - HYDROmorphone (DILAUDID) 4 MG tablet; Take 1/2 to 1 tablet TID - QID PRN maximum 3-1/2 tablets  Dispense: 105 tablet; Refill: 0  3. Other fatigue Related to MS, healthy diet and activity - HYDROmorphone (DILAUDID) 4 MG tablet; Take 1/2 to 1 tablet qid prn , maximum use per day is 3-1/2 tablets  Dispense: 105 tablet; Refill: 0 - HYDROmorphone (DILAUDID) 4 MG tablet; Take one half to 1 tablet 4 times daily as needed maximum 3-1/2 tablets daily  Dispense: 105 tablet; Refill: 0 - HYDROmorphone (DILAUDID) 4 MG tablet; Take 1/2 to 1 tablet TID - QID PRN maximum 3-1/2 tablets  Dispense: 105 tablet; Refill: 0  4. MS (multiple sclerosis) (HCC) See neuro as planned Pain meds help cope with the pain Allows her to function  5. Morbid obesity (HCC) Glp1 discussed Portion control Activity Pt will give feedback

## 2021-05-25 LAB — LIPID PANEL
Chol/HDL Ratio: 3.5 ratio (ref 0.0–4.4)
Cholesterol, Total: 243 mg/dL — ABNORMAL HIGH (ref 100–199)
HDL: 70 mg/dL (ref >39)
LDL Chol Calc (NIH): 156 mg/dL — ABNORMAL HIGH (ref 0–99)
Triglycerides: 96 mg/dL (ref 0–149)
VLDL Cholesterol Cal: 17 mg/dL (ref 5–40)

## 2021-05-25 LAB — COMPREHENSIVE METABOLIC PANEL
ALT: 11 IU/L (ref 0–32)
AST: 15 IU/L (ref 0–40)
Albumin/Globulin Ratio: 1.6 (ref 1.2–2.2)
Albumin: 4.7 g/dL (ref 3.8–4.8)
Alkaline Phosphatase: 141 IU/L — ABNORMAL HIGH (ref 44–121)
BUN/Creatinine Ratio: 6 — ABNORMAL LOW (ref 9–23)
BUN: 6 mg/dL (ref 6–24)
Bilirubin Total: 0.3 mg/dL (ref 0.0–1.2)
CO2: 24 mmol/L (ref 20–29)
Calcium: 9.1 mg/dL (ref 8.7–10.2)
Chloride: 96 mmol/L (ref 96–106)
Creatinine, Ser: 0.97 mg/dL (ref 0.57–1.00)
Globulin, Total: 2.9 g/dL (ref 1.5–4.5)
Glucose: 87 mg/dL (ref 70–99)
Potassium: 4.6 mmol/L (ref 3.5–5.2)
Sodium: 140 mmol/L (ref 134–144)
Total Protein: 7.6 g/dL (ref 6.0–8.5)
eGFR: 71 mL/min/{1.73_m2} (ref >59)

## 2021-05-25 LAB — CBC WITH DIFFERENTIAL/PLATELET
Basophils Absolute: 0.1 10*3/uL (ref 0.0–0.2)
Basos: 2 %
EOS (ABSOLUTE): 0.2 10*3/uL (ref 0.0–0.4)
Eos: 4 %
Hematocrit: 38.8 % (ref 34.0–46.6)
Hemoglobin: 12.6 g/dL (ref 11.1–15.9)
Immature Grans (Abs): 0 10*3/uL (ref 0.0–0.1)
Immature Granulocytes: 0 %
Lymphocytes Absolute: 1.2 10*3/uL (ref 0.7–3.1)
Lymphs: 26 %
MCH: 26.9 pg (ref 26.6–33.0)
MCHC: 32.5 g/dL (ref 31.5–35.7)
MCV: 83 fL (ref 79–97)
Monocytes Absolute: 0.4 10*3/uL (ref 0.1–0.9)
Monocytes: 9 %
Neutrophils Absolute: 2.8 10*3/uL (ref 1.4–7.0)
Neutrophils: 59 %
Platelets: 377 10*3/uL (ref 150–450)
RBC: 4.69 x10E6/uL (ref 3.77–5.28)
RDW: 13.6 % (ref 11.7–15.4)
WBC: 4.7 10*3/uL (ref 3.4–10.8)

## 2021-05-25 LAB — TSH: TSH: 2.67 u[IU]/mL (ref 0.450–4.500)

## 2021-06-13 ENCOUNTER — Other Ambulatory Visit: Payer: Self-pay

## 2021-06-13 ENCOUNTER — Ambulatory Visit (INDEPENDENT_AMBULATORY_CARE_PROVIDER_SITE_OTHER): Payer: 59

## 2021-06-13 VITALS — Ht 69.0 in | Wt 258.0 lb

## 2021-06-13 DIAGNOSIS — Z0000 Encounter for general adult medical examination without abnormal findings: Secondary | ICD-10-CM | POA: Diagnosis not present

## 2021-06-13 NOTE — Progress Notes (Signed)
Subjective:   Melinda Moore is a 51 y.o. female who presents for an Initial Medicare Annual Wellness Visit. Virtual Visit via Telephone Note  I connected with  Melinda Moore on 06/13/21 at  2:20 PM EST by telephone and verified that I am speaking with the correct person using two identifiers.  Location: Patient: Home Provider: [facility] Persons participating in the virtual visit: patient/Nurse Health Advisor   I discussed the limitations, risks, security and privacy concerns of performing an evaluation and management service by telephone and the availability of in person appointments. The patient expressed understanding and agreed to proceed.  Interactive audio and video telecommunications were attempted between this nurse and patient, however failed, due to patient having technical difficulties OR patient did not have access to video capability.  We continued and completed visit with audio only.  Some vital signs may be absent or patient reported.   Darral Dash, LPN  Review of Systems     Cardiac Risk Factors include: sedentary lifestyle;obesity (BMI >30kg/m2);Other (see comment), Risk factor comments: MS    PHONE VISIT. PT AT HOME. NURSE AT [facility]. Objective:    Today's Vitals   06/13/21 1426 06/13/21 1427  Weight: 258 lb (117 kg)   Height: 5\' 9"  (1.753 m)   PainSc:  7    Body mass index is 38.1 kg/m.  Advanced Directives 06/13/2021 02/20/2021 06/14/2020 02/07/2020 02/04/2020 07/14/2019 07/10/2019  Does Patient Have a Medical Advance Directive? No No No No No No No  Would patient like information on creating a medical advance directive? No - Patient declined No - Patient declined No - Patient declined No - Patient declined - No - Patient declined -    Current Medications (verified) Outpatient Encounter Medications as of 06/13/2021  Medication Sig   albuterol (VENTOLIN HFA) 108 (90 Base) MCG/ACT inhaler Inhale 2 puffs into the lungs every 6 (six) hours as needed for wheezing or shortness  of breath.   ascorbic acid (VITAMIN C) 500 MG tablet Take 1 tablet (500 mg total) by mouth daily.   Bioflavonoid Products (BIOFLEX) TABS Take 2 tablets by mouth daily.   buPROPion (WELLBUTRIN SR) 150 MG 12 hr tablet Take 1 tablet (150 mg total) by mouth 2 (two) times daily.   carbamazepine (TEGRETOL XR) 200 MG 12 hr tablet Take 1 tablet (200 mg total) by mouth 3 (three) times daily as needed.   Cholecalciferol (VITAMIN D3) 125 MCG (5000 UT) TABS Take 20,000 Units by mouth 2 (two) times daily.   gabapentin (NEURONTIN) 100 MG capsule Take by mouth.   HYDROmorphone (DILAUDID) 4 MG tablet Take 1/2 to 1 tablet qid prn , maximum use per day is 3-1/2 tablets   HYDROmorphone (DILAUDID) 4 MG tablet Take one half to 1 tablet 4 times daily as needed maximum 3-1/2 tablets daily   HYDROmorphone (DILAUDID) 4 MG tablet Take 1/2 to 1 tablet TID - QID PRN maximum 3-1/2 tablets   Ipratropium-Albuterol (COMBIVENT RESPIMAT) 20-100 MCG/ACT AERS respimat Inhale 2 puffs 4 times daily prn   linaclotide (LINZESS) 145 MCG CAPS capsule Take 1 capsule (145 mcg total) by mouth daily before breakfast.   LUTEIN PO Take 1 tablet by mouth daily.   Multiple Vitamin (MULTIVITAMIN WITH MINERALS) TABS tablet Take 1 tablet by mouth daily. ALIVE   Multiple Vitamins-Minerals (EMERGEN-C IMMUNE) PACK Take 1 packet by mouth once a week.   naloxone (NARCAN) nasal spray 4 mg/0.1 mL Use as directed if accidental overdose   ondansetron (ZOFRAN-ODT) 8  MG disintegrating tablet TAKE 1 TABLET BY MOUTH EVERY 8 HOURS AS NEEDED FOR NAUSEA OR VOMITING   pantoprazole (PROTONIX) 40 MG tablet TAKE ONE (1) TABLET BY MOUTH EVERY DAY   Polyethylene Glycol 400 0.25 % SOLN Place 1 drop into the right eye 2 (two) times daily as needed (dry eyes).   potassium chloride SA (KLOR-CON M20) 20 MEQ tablet TAKE ONE TABLET ( TOTAL) BY MOUTH DAILY   sertraline (ZOLOFT) 50 MG tablet TAKE ONE-HALF TABLET BY MOUTH TWICE A DAY   torsemide (DEMADEX) 20 MG tablet Take  1/2 to 1  tablet po q am prn pedal edema   zinc sulfate 220 (50 Zn) MG capsule Take 1 capsule (220 mg total) by mouth daily.   [DISCONTINUED] gabapentin (NEURONTIN) 300 MG capsule Take 1 capsule (300 mg total) by mouth 3 (three) times daily. (Patient taking differently: Take 100 mg by mouth 4 (four) times daily. Rx given by Dr Renne Crigler-  Takes 100 mg 1 at lunch and 3 at night)   No facility-administered encounter medications on file as of 06/13/2021.    Allergies (verified) Ambien [zolpidem tartrate] and Tape   History: Past Medical History:  Diagnosis Date   Anemia    Anxiety    Bell's palsy    Depression    DVT (deep venous thrombosis) (HCC) 09/02/2012   [place] on 02/13/2012 showed acute DVT in left calf veins and posterior tibial veins   Fibromyalgia    GERD (gastroesophageal reflux disease)    History of blood transfusion 2000   History of trigeminal neuralgia    HTN (hypertension)    patient denies was taking a blood pressure medication in early 2000 but it was migraines   Leukopenia    MS (multiple sclerosis) (HCC) 1997   Neuropathic pain    Peripheral edema    Port catheter in place 12/31/2012   Right bundle branch block    Past Surgical History:  Procedure Laterality Date   ABDOMINAL HYSTERECTOMY     CHOLECYSTECTOMY     ESOPHAGOGASTRODUODENOSCOPY  2011   Dr. Jena Gauss. Possible cervical esophageal with status post disruption with passage of Maloney dilator, glycol esophageal erythema, antral erosions. Biopsy from the stomach revealed minimal chronic inactive inflammation but negative for H. pylori   LAPAROSCOPIC GASTRIC SLEEVE RESECTION N/A 04/17/2016   Procedure: LAPAROSCOPIC GASTRIC SLEEVE RESECTION WITH UPPER ENDOSCOPY;  Surgeon: Glenna Fellows, MD;  Location: [facility];  Service: General;  Laterality: N/A;   PATELLA-FEMORAL ARTHROPLASTY Right 07/14/2019   Procedure: PATELLA-FEMORAL ARTHROPLASTY;  Surgeon: Teryl Lucy, MD;  Location: [facility];  Service: Orthopedics;  Laterality:  Right;   PORTACATH PLACEMENT     TUBAL LIGATION  2001   Family History  Problem Relation Age of Onset   Heart disease Father    Hyperlipidemia Father    Congestive Heart Failure Father    Hypertension Sister    Other Paternal Uncle        Possible colon cancer, age greater than 10   Cirrhosis Brother        etoh   Healthy Mother    Healthy Daughter    Healthy Daughter    Social History   Socioeconomic History   Marital status: Married    Spouse name: Viviann Spare   Number of children: 2   Years of education: some college   Highest education level: Not on file  Occupational History   Occupation: disabled  Tobacco Use   Smoking status: Former    Packs/day: 0.25  Types: Cigarettes    Quit date: 41    Years since quitting: 33.0   Smokeless tobacco: Never   Tobacco comments:    4 a day, quit in 1990  Vaping Use   Vaping Use: Never used  Substance and Sexual Activity   Alcohol use: No   Drug use: No   Sexual activity: Not on file  Other Topics Concern   Not on file  Social History Narrative   Lives at home with husband and 2 daughters. 3 grand daughters.    Right handed   Takes 1/2 caffeine pill per day   Social Determinants of Health   Financial Resource Strain: Low Risk    Difficulty of Paying Living Expenses: Not hard at all  Food Insecurity: No Food Insecurity   Worried About Programme researcher, broadcasting/film/video in the Last Year: Never true   Barista in the Last Year: Never true  Transportation Needs: No Transportation Needs   Lack of Transportation (Medical): No   Lack of Transportation (Non-Medical): No  Physical Activity: Inactive   Days of Exercise per Week: 0 days   Minutes of Exercise per Session: 0 min  Stress: No Stress Concern Present   Feeling of Stress : Not at all  Social Connections: Socially Integrated   Frequency of Communication with Friends and Family: More than three times a week   Frequency of Social Gatherings with Friends and Family: More  than three times a week   Attends Religious Services: 1 to 4 times per year   Active Member of [employer] or Organizations: No   Attends Engineer, structural: 1 to 4 times per year   Marital Status: Married    Tobacco Counseling Counseling given: Not Answered Tobacco comments: 4 a day, quit in 1990   Clinical Intake:  Pre-visit preparation completed: Yes  Pain : 0-10 Pain Score: 7  Pain Type: Chronic pain Pain Location: Knee Pain Descriptors / Indicators: Aching Pain Onset: More than a month ago Pain Frequency: Intermittent     BMI - recorded: 38.1 Nutritional Status: BMI > 30  Obese Nutritional Risks: None Diabetes: No  How often do you need to have someone help you when you read instructions, pamphlets, or other written materials from your doctor or pharmacy?: 1 - Never  Diabetic?No  Interpreter Needed?: No  Information entered by :: MJ Elvie Palomo,LPN   Activities of Daily Living In your present state of health, do you have any difficulty performing the following activities: 06/13/2021  Hearing? N  Vision? N  Difficulty concentrating or making decisions? Y  Comment Do to MS  Walking or climbing stairs? N  Dressing or bathing? N  Doing errands, shopping? N  Preparing Food and eating ? N  Using the Toilet? N  In the past six months, have you accidently leaked urine? N  Do you have problems with loss of bowel control? N  Managing your Medications? N  Managing your Finances? N  Housekeeping or managing your Housekeeping? N  Some recent data might be hidden    Patient Care Team: Babs Sciara, MD as PCP - General (Family Medicine) Jena Gauss Gerrit Friends, MD as Attending Physician (Gastroenterology) Imagene Gurney, MD (Inactive) as Referring Physician (Hematology)  Indicate any recent Medical Services you may have received from other than [facility] providers in the past year (date may be approximate).     Assessment:   This is a routine wellness examination for  Faelyn.  Hearing/Vision screen Hearing Screening -  Comments:: No hearing issues.   Vision Screening - Comments:: [place]. Glasses. 2022.  Dietary issues and exercise activities discussed: Current Exercise Habits: The patient does not participate in regular exercise at present, Exercise limited by: neurologic condition(s)   Goals Addressed             This Visit's Progress    DIET - REDUCE CALORIE INTAKE       Would like to lose weight in the next year.     Exercise 3x per week (30 min per time)       Increase exercise as tolerated.       Depression Screen PHQ 2/9 Scores 06/13/2021 02/22/2021 11/21/2020 08/15/2020 06/29/2020 04/07/2020 06/17/2018  PHQ - 2 Score 0 0 0 0 0 0 0  PHQ- 9 Score - - - - 1 2 2     Fall Risk Fall Risk  06/13/2021 02/22/2021 11/21/2020 06/17/2018 05/23/2018  Falls in the past year? 0 0 0 0 1  Number falls in past yr: 0 0 0 0 0  Injury with Fall? 0 0 0 0 0  Risk for fall due to : Impaired balance/gait;Impaired mobility No Fall Risks No Fall Risks - -  Risk for fall due to: Comment - - - - -  Follow up Falls prevention discussed Falls evaluation completed Falls evaluation completed Falls evaluation completed Education provided    FALL RISK PREVENTION PERTAINING TO THE HOME:  Any stairs in or around the home? Yes  If so, are there any without handrails? No  Home free of loose throw rugs in walkways, pet beds, electrical cords, etc? Yes  Adequate lighting in your home to reduce risk of falls? Yes   ASSISTIVE DEVICES UTILIZED TO PREVENT FALLS:  Life alert? No  Use of a cane, walker or w/c? No  Grab bars in the bathroom? No  Shower chair or bench in shower? Yes  Elevated toilet seat or a handicapped toilet? Yes   TIMED UP AND GO:  Was the test performed? No .  Phone visit.  Cognitive Function: Normal cognitive status assessed by direct observation by this Nurse Health Advisor. No abnormalities found.  Pt requests not to do 6CIT because she was driving at  time of visit.         Immunizations Immunization History  Administered Date(s) Administered   Influenza Split 04/06/2013   Influenza,inj,Quad PF,6+ Mos 05/24/2015, 03/16/2016, 03/25/2017, 03/17/2018   Influenza-Unspecified 03/10/2012, 03/31/2014, 03/25/2021   Moderna Sars-Covid-2 Vaccination 03/25/2020, 04/15/2020, 07/15/2020   Pneumococcal-Unspecified 03/21/2006   Tdap 04/18/2018    TDAP status: Up to date  Flu Vaccine status: Up to date  Pneumococcal vaccine status: Due, Education has been provided regarding the importance of this vaccine. Advised may receive this vaccine at local pharmacy or Health Dept. Aware to provide a copy of the vaccination record if obtained from local pharmacy or Health Dept. Verbalized acceptance and understanding.  Covid-19 vaccine status: Completed vaccines  Qualifies for Shingles Vaccine? Yes   Zostavax completed No   Shingrix Completed?: No.    Education has been provided regarding the importance of this vaccine. Patient has been advised to call insurance company to determine out of pocket expense if they have not yet received this vaccine. Advised may also receive vaccine at local pharmacy or Health Dept. Verbalized acceptance and understanding.  Screening Tests Health Maintenance  Topic Date Due   Pneumococcal Vaccine 46-73 Years old (1 - PCV) 12/02/1976   Hepatitis C Screening  Never done   Zoster  Vaccines- Shingrix (1 of 2) Never done   COLONOSCOPY (Pts 45-59yrs Insurance coverage will need to be confirmed)  Never done   MAMMOGRAM  03/16/2020   COVID-19 Vaccine (3 - Moderna risk series) 08/12/2020   TETANUS/TDAP  04/18/2028   INFLUENZA VACCINE  Completed   HIV Screening  Completed   HPV VACCINES  Aged Out    Health Maintenance  Health Maintenance Due  Topic Date Due   Pneumococcal Vaccine 53-88 Years old (1 - PCV) 12/02/1976   Hepatitis C Screening  Never done   Zoster Vaccines- Shingrix (1 of 2) Never done   COLONOSCOPY (Pts  45-38yrs Insurance coverage will need to be confirmed)  Never done   MAMMOGRAM  03/16/2020   COVID-19 Vaccine (3 - Moderna risk series) 08/12/2020    Colorectal cancer screening: Type of screening: Colonoscopy. Completed 04/18/2010. Repeat every 10 years  Mammogram status: Completed 03/17/2019. Repeat every year  Bone Density status: Ordered NOT REQUIRED DUE TO AGE. Pt provided with contact info and advised to call to schedule appt.  Lung Cancer Screening: (Low Dose CT Chest recommended if Age 33-80 years, 30 pack-year currently smoking OR have quit w/in 15years.) does not qualify.    Additional Screening:  Hepatitis C Screening: does not qualify.  Vision Screening: Recommended annual ophthalmology exams for early detection of glaucoma and other disorders of the eye. Is the patient up to date with their annual eye exam?  Yes  Who is the provider or what is the name of the office in which the patient attends annual eye exams? Dr. Carita Pian If pt is not established with a provider, would they like to be referred to a provider to establish care? No .   Dental Screening: Recommended annual dental exams for proper oral hygiene  Community Resource Referral / Chronic Care Management: CRR required this visit?  No   CCM required this visit?  No      Plan:     I have personally reviewed and noted the following in the patients chart:   Medical and social history Use of alcohol, tobacco or illicit drugs  Current medications and supplements including opioid prescriptions. Patient is currently taking opioid prescriptions. Information provided to patient regarding non-opioid alternatives. Patient advised to discuss non-opioid treatment plan with their provider. Functional ability and status Nutritional status Physical activity Advanced directives List of other physicians Hospitalizations, surgeries, and ER visits in previous 12 months Vitals Screenings to include cognitive,  depression, and falls Referrals and appointments  In addition, I have reviewed and discussed with patient certain preventive protocols, quality metrics, and best practice recommendations. A written personalized care plan for preventive services as well as general preventive health recommendations were provided to patient.     Darral Dash, LPN   06/16/1094   Nurse Notes: Discussed colonoscopy and mammogram and need to schedule. Pt states she will call to schedule colonoscopy, this week.  Pt states she thinks her  mammogram is scheduled for March 2023. Pt is up to date on age appropriate vaccines. Requested pt bring documentation of Shingles vaccine after her 2nd shot.

## 2021-06-13 NOTE — Patient Instructions (Signed)
Melinda Moore , Thank you for taking time to come for your Medicare Wellness Visit. I appreciate your ongoing commitment to your health goals. Please review the following plan we discussed and let me know if I can assist you in the future.   Screening recommendations/referrals: Colonoscopy: Scheduled for 07/2021. Mammogram: Due Repeat annually Last done 03/17/2019. Bone Density: Not due until age 51.  Recommended yearly ophthalmology/optometry visit for glaucoma screening and checkup Recommended yearly dental visit for hygiene and checkup  Vaccinations: Influenza vaccine: Done 04/2021 Repeat annually  Pneumococcal vaccine: Done 03/21/2006. Second dose due at your convenience. Tdap vaccine: Done 04/18/2018 Repeat in 10 years  Shingles vaccine: Please bring copy of vaccine record, to enter dates in your chart. Thank you.  Covid-19: Done 03/25/2020, 04/15/2020 and 07/15/2020.   Advanced directives: Advance directive discussed with you today. Even though you declined this today, please call our office should you change your mind, and we can give you the proper paperwork for you to fill out.   Conditions/risks identified: Aim for 30 minutes of exercise or brisk walking each day, drink 6-8 glasses of water and eat lots of fruits and vegetables.   Next appointment: Follow up in one year for your annual wellness visit. 06/19/2022 @ 2:20 pm.  Preventive Care 40-64 Years, Female Preventive care refers to lifestyle choices and visits with your health care provider that can promote health and wellness. What does preventive care include? A yearly physical exam. This is also called an annual well check. Dental exams once or twice a year. Routine eye exams. Ask your health care provider how often you should have your eyes checked. Personal lifestyle choices, including: Daily care of your teeth and gums. Regular physical activity. Eating a healthy diet. Avoiding tobacco and drug use. Limiting alcohol  use. Practicing safe sex. Taking low-dose aspirin daily starting at age 96. Taking vitamin and mineral supplements as recommended by your health care provider. What happens during an annual well check? The services and screenings done by your health care provider during your annual well check will depend on your age, overall health, lifestyle risk factors, and family history of disease. Counseling  Your health care provider may ask you questions about your: Alcohol use. Tobacco use. Drug use. Emotional well-being. Home and relationship well-being. Sexual activity. Eating habits. Work and work Statistician. Method of birth control. Menstrual cycle. Pregnancy history. Screening  You may have the following tests or measurements: Height, weight, and BMI. Blood pressure. Lipid and cholesterol levels. These may be checked every 5 years, or more frequently if you are over 7 years old. Skin check. Lung cancer screening. You may have this screening every year starting at age 27 if you have a 30-pack-year history of smoking and currently smoke or have quit within the past 15 years. Fecal occult blood test (FOBT) of the stool. You may have this test every year starting at age 85. Flexible sigmoidoscopy or colonoscopy. You may have a sigmoidoscopy every 5 years or a colonoscopy every 10 years starting at age 104. Hepatitis C blood test. Hepatitis B blood test. Sexually transmitted disease (STD) testing. Diabetes screening. This is done by checking your blood sugar (glucose) after you have not eaten for a while (fasting). You may have this done every 1-3 years. Mammogram. This may be done every 1-2 years. Talk to your health care provider about when you should start having regular mammograms. This may depend on whether you have a family history of breast cancer. BRCA-related cancer  screening. This may be done if you have a family history of breast, ovarian, tubal, or peritoneal cancers. Pelvic  exam and Pap test. This may be done every 3 years starting at age 70. Starting at age 68, this may be done every 5 years if you have a Pap test in combination with an HPV test. Bone density scan. This is done to screen for osteoporosis. You may have this scan if you are at high risk for osteoporosis. Discuss your test results, treatment options, and if necessary, the need for more tests with your health care provider. Vaccines  Your health care provider may recommend certain vaccines, such as: Influenza vaccine. This is recommended every year. Tetanus, diphtheria, and acellular pertussis (Tdap, Td) vaccine. You may need a Td booster every 10 years. Zoster vaccine. You may need this after age 75. Pneumococcal 13-valent conjugate (PCV13) vaccine. You may need this if you have certain conditions and were not previously vaccinated. Pneumococcal polysaccharide (PPSV23) vaccine. You may need one or two doses if you smoke cigarettes or if you have certain conditions. Talk to your health care provider about which screenings and vaccines you need and how often you need them. This information is not intended to replace advice given to you by your health care provider. Make sure you discuss any questions you have with your health care provider. Document Released: 06/24/2015 Document Revised: 02/15/2016 Document Reviewed: 03/29/2015 Elsevier Interactive Patient Education  2017 [employer] Prevention in the Home Falls can cause injuries. They can happen to people of all ages. There are many things you can do to make your home safe and to help prevent falls. What can I do on the outside of my home? Regularly fix the edges of walkways and driveways and fix any cracks. Remove anything that might make you trip as you walk through a door, such as a raised step or threshold. Trim any bushes or trees on the path to your home. Use bright outdoor lighting. Clear any walking paths of anything that might  make someone trip, such as rocks or tools. Regularly check to see if handrails are loose or broken. Make sure that both sides of any steps have handrails. Any raised decks and porches should have guardrails on the edges. Have any leaves, snow, or ice cleared regularly. Use sand or salt on walking paths during winter. Clean up any spills in your garage right away. This includes oil or grease spills. What can I do in the bathroom? Use night lights. Install grab bars by the toilet and in the tub and shower. Do not use towel bars as grab bars. Use non-skid mats or decals in the tub or shower. If you need to sit down in the shower, use a plastic, non-slip stool. Keep the floor dry. Clean up any water that spills on the floor as soon as it happens. Remove soap buildup in the tub or shower regularly. Attach bath mats securely with double-sided non-slip rug tape. Do not have throw rugs and other things on the floor that can make you trip. What can I do in the bedroom? Use night lights. Make sure that you have a light by your bed that is easy to reach. Do not use any sheets or blankets that are too big for your bed. They should not hang down onto the floor. Have a firm chair that has side arms. You can use this for support while you get dressed. Do not have throw rugs  and other things on the floor that can make you trip. What can I do in the kitchen? Clean up any spills right away. Avoid walking on wet floors. Keep items that you use a lot in easy-to-reach places. If you need to reach something above you, use a strong step stool that has a grab bar. Keep electrical cords out of the way. Do not use floor polish or wax that makes floors slippery. If you must use wax, use non-skid floor wax. Do not have throw rugs and other things on the floor that can make you trip. What can I do with my stairs? Do not leave any items on the stairs. Make sure that there are handrails on both sides of the stairs  and use them. Fix handrails that are broken or loose. Make sure that handrails are as long as the stairways. Check any carpeting to make sure that it is firmly attached to the stairs. Fix any carpet that is loose or worn. Avoid having throw rugs at the top or bottom of the stairs. If you do have throw rugs, attach them to the floor with carpet tape. Make sure that you have a light switch at the top of the stairs and the bottom of the stairs. If you do not have them, ask someone to add them for you. What else can I do to help prevent falls? Wear shoes that: Do not have high heels. Have rubber bottoms. Are comfortable and fit you well. Are closed at the toe. Do not wear sandals. If you use a stepladder: Make sure that it is fully opened. Do not climb a closed stepladder. Make sure that both sides of the stepladder are locked into place. Ask someone to hold it for you, if possible. Clearly mark and make sure that you can see: Any grab bars or handrails. First and last steps. Where the edge of each step is. Use tools that help you move around (mobility aids) if they are needed. These include: Canes. Walkers. Scooters. Crutches. Turn on the lights when you go into a dark area. Replace any light bulbs as soon as they burn out. Set up your furniture so you have a clear path. Avoid moving your furniture around. If any of your floors are uneven, fix them. If there are any pets around you, be aware of where they are. Review your medicines with your doctor. Some medicines can make you feel dizzy. This can increase your chance of falling. Ask your doctor what other things that you can do to help prevent falls. This information is not intended to replace advice given to you by your health care provider. Make sure you discuss any questions you have with your health care provider. Document Released: 03/24/2009 Document Revised: 11/03/2015 Document Reviewed: 07/02/2014 Elsevier Interactive Patient  Education  2017 [employer].

## 2021-06-14 ENCOUNTER — Other Ambulatory Visit: Payer: Self-pay | Admitting: Family Medicine

## 2021-06-14 ENCOUNTER — Ambulatory Visit (INDEPENDENT_AMBULATORY_CARE_PROVIDER_SITE_OTHER): Payer: 59 | Admitting: Nurse Practitioner

## 2021-06-14 ENCOUNTER — Encounter: Payer: Self-pay | Admitting: Nurse Practitioner

## 2021-06-14 VITALS — BP 118/62 | HR 118 | Temp 97.7°F | Ht 69.0 in | Wt 257.0 lb

## 2021-06-14 DIAGNOSIS — Z90710 Acquired absence of both cervix and uterus: Secondary | ICD-10-CM

## 2021-06-14 DIAGNOSIS — Z01411 Encounter for gynecological examination (general) (routine) with abnormal findings: Secondary | ICD-10-CM

## 2021-06-14 DIAGNOSIS — F419 Anxiety disorder, unspecified: Secondary | ICD-10-CM

## 2021-06-14 DIAGNOSIS — G35D Multiple sclerosis, unspecified: Secondary | ICD-10-CM

## 2021-06-14 DIAGNOSIS — G35 Multiple sclerosis: Secondary | ICD-10-CM

## 2021-06-14 DIAGNOSIS — R748 Abnormal levels of other serum enzymes: Secondary | ICD-10-CM

## 2021-06-14 DIAGNOSIS — Z01419 Encounter for gynecological examination (general) (routine) without abnormal findings: Secondary | ICD-10-CM

## 2021-06-14 MED ORDER — CARBAMAZEPINE ER 200 MG PO TB12: 200.0000 mg | ORAL_TABLET | Freq: Three times a day (TID) | ORAL | 1 refills | Status: DC | PRN

## 2021-06-14 MED ORDER — BUPROPION HCL ER (SR) 150 MG PO TB12: 150.0000 mg | ORAL_TABLET | Freq: Two times a day (BID) | ORAL | 1 refills | Status: DC

## 2021-06-14 NOTE — Progress Notes (Signed)
Subjective:    Patient ID: Melinda Moore, female    DOB: [birth year], 51 y.o.   MRN: [id number]  HPI The patient comes in today for a wellness visit   A review of their health history was completed.  A review of medications was also completed.  Any needed refills; wellbutrin, cabamazepine  Eating habits: fair. Patient states that she does not eat a lot, however she does do a lot of snack on cookies and other unhealthy snacks.   Falls/  MVA accidents in past few months: no  Regular exercise: walking, light housekeeping  Specialist pt sees on regular basis: Dr Shelia Media- neurologist  Preventative health issues were discussed.   Last PAP unknown. However patient has hx of hysterectomy Mammogram due. Patient to schedule Eye exam due 2023 Dental exam due 2023 COVID vax UTD Shingles vax UTD Pneumococcal UTD Colonoscopy due 08/2021 HIV UTD Hep C due  Additional concerns:   - Patient wanted to know what else she can do for weight loss.   - Slightly elevated Alkaline phosphatase noted on last CMP. Patient denies any abd pain.   - Patient sees Dr. Nicki Reaper for chronic pain related to MS, fibromyalgia.   Review of Systems All systems negative today. Patient has no complaints    Objective:   Physical Exam Exam conducted with a chaperone present.  Constitutional:      General: She is not in acute distress.    Appearance: Normal appearance. She is obese. She is not ill-appearing or toxic-appearing.  HENT:     Head: Normocephalic.     Comments: Chronic right facial drooping Cardiovascular:     Rate and Rhythm: Normal rate and regular rhythm.     Pulses: Normal pulses.     Heart sounds: Normal heart sounds. No murmur heard. Pulmonary:     Effort: Pulmonary effort is normal. No respiratory distress.     Breath sounds: No wheezing.  Abdominal:     General: Abdomen is flat. Bowel sounds are normal. There is no distension.     Palpations: Abdomen is soft. There is no mass.      Tenderness: There is no abdominal tenderness.     Hernia: No hernia is present. There is no hernia in the left inguinal area or right inguinal area.  Genitourinary:    Exam position: Lithotomy position.     Pubic Area: No rash or pubic lice.      Labia:        Right: No rash, tenderness, lesion or injury.        Left: No rash, tenderness, lesion or injury.      Urethra: No prolapse, urethral pain, urethral swelling or urethral lesion.     Vagina: Normal.     Uterus: Absent.      Adnexa: Right adnexa normal.     Rectum: Normal.     Comments: Cervix absent Musculoskeletal:     Cervical back: Normal range of motion and neck supple. No rigidity or tenderness.  Lymphadenopathy:     Cervical: No cervical adenopathy.     Lower Body: No right inguinal adenopathy. No left inguinal adenopathy.  Neurological:     Mental Status: She is alert.          Assessment & Plan:   1. Women's annual routine gynecological examination - Adult wellness-complete.wellness physical was conducted today. Importance of diet and exercise were discussed in detail.  In addition to this a discussion regarding safety was also covered. We also  reviewed over immunizations and gave recommendations regarding current immunization needed for age.  In addition to this additional areas were also touched on including: - Preventative health exams needed:  Mammogram due. Patient to schedule Eye exam due 2023 Dental exam due 2023 COVID vax UTD Shingles vax UTD Pneumococcal UTD Colonoscopy due 08/2021 HIV UTD Hep C due - Patient was advised yearly wellness exam  - CMP14+EGFR - Hepatitis C Antibody drawn today - Patient is scheduled to see Dr. Nicki Reaper in 08/2020   2. Morbid obesity ([place]) - Patient's goal weight is 180. - Patient interested in loosing more weight and weight loss agents  - Discussed GLP-1 and phentamine, however it was emphasized that GLP-1 and other weight loss agents are adjunctive therapies and may  not help much if not coupled with diet and exercise.  - Collectively decided to work on diet first and revisit GLP-1 discussion. - Patient to check to see if insurance will cover GLP-1 - Patient to count added sugars and work on cutting down added sugar consumption to 25g a day (per WHO recommendation). Start at a reasonable goal an cut down sugar consuption by ~10g a months (or sooner if tolerated). - Try to walk or do other physical exercise ~30 mins a day 3-5 days a week.   3. MS (multiple sclerosis) (HCC) - carbamazepine (TEGRETOL XR) 200 MG 12 hr tablet; Take 1 tablet (200 mg total) by mouth 3 (three) times daily as needed.  Dispense: 90 tablet; Refill: 1  4. Chronic anxiety - buPROPion (WELLBUTRIN SR) 150 MG 12 hr tablet; Take 1 tablet (150 mg total) by mouth 2 (two) times daily.  Dispense: 180 tablet; Refill: 1  5. Hx of hysterectomy - Patient has hx of hysterectomy - PAP smears no longer medically necessary since patient does not have hx of CIN.   6. Elevated alkaline phosphatase - Alkaline phosphatase elevated during last blood draw - Repeat CMP done today to confirm reading. - Depending on result will review medication list for possible causes.

## 2021-06-17 LAB — IGP, APTIMA HPV: HPV Aptima: NEGATIVE

## 2021-06-20 NOTE — Progress Notes (Signed)
Your PAP was negative. Due to your history of a hysterectomy and no abnormal PAPs in the past, I do not recommend any more PAPs.

## 2021-06-23 ENCOUNTER — Ambulatory Visit (INDEPENDENT_AMBULATORY_CARE_PROVIDER_SITE_OTHER): Payer: 59 | Admitting: Nurse Practitioner

## 2021-06-23 ENCOUNTER — Other Ambulatory Visit: Payer: Self-pay

## 2021-06-23 VITALS — BP 114/75 | HR 92 | Temp 97.5°F | Ht 69.0 in | Wt 259.8 lb

## 2021-06-23 DIAGNOSIS — R21 Rash and other nonspecific skin eruption: Secondary | ICD-10-CM

## 2021-06-23 MED ORDER — DOXYCYCLINE HYCLATE 100 MG PO TABS: 100.0000 mg | ORAL_TABLET | Freq: Two times a day (BID) | ORAL | 0 refills | Status: DC

## 2021-06-23 MED ORDER — FLUCONAZOLE 150 MG PO TABS: ORAL_TABLET | ORAL | 0 refills | Status: DC

## 2021-06-23 NOTE — Progress Notes (Signed)
° °  Subjective:    Patient ID: Melinda Moore, female    DOB: 05-12-1971, 51 y.o.   MRN: [id number]  HPI Patient has a knot on left thigh. Has been there since last week.  Has decreased in size, redness has improved but with some discoloration.  Nontender at this point.  No history of injury or possible foreign body.  No fever.       Objective:   Physical Exam NAD alert, oriented.  Superficial rubbery tiny cystic lesion noted in the left upper anterior thigh area.  Confluent dark area noted over the cyst.  No visible erythema.  No warmth.  Nontender.       Assessment & Plan:  Rash and nonspecific skin eruption Meds ordered this encounter  Medications   doxycycline (VIBRA-TABS) 100 MG tablet    Sig: Take 1 tablet (100 mg total) by mouth 2 (two) times daily.    Dispense:  14 tablet    Refill:  0    Order Specific Question:   Supervising Provider    Answer:   Lilyan Punt A [[id number]]   fluconazole (DIFLUCAN) 150 MG tablet    Sig: One po qd prn yeast infection; may repeat in 3-4 days if needed    Dispense:  2 tablet    Refill:  0    Order Specific Question:   Supervising Provider    Answer:   Lilyan Punt A [[id number]]   As a precaution started on doxycycline for 7 days.  Reviewed warning signs.  Expect gradual resolution of discoloration.  There is a question whether the cyst will remain.  Call back if worsens.  Recheck cystic area at next visit if persists.

## 2021-06-24 ENCOUNTER — Encounter: Payer: Self-pay | Admitting: Nurse Practitioner

## 2021-08-16 DIAGNOSIS — G35 Multiple sclerosis: Secondary | ICD-10-CM | POA: Diagnosis not present

## 2021-08-21 ENCOUNTER — Other Ambulatory Visit: Payer: Self-pay

## 2021-08-21 ENCOUNTER — Ambulatory Visit (INDEPENDENT_AMBULATORY_CARE_PROVIDER_SITE_OTHER): Payer: 59 | Admitting: Family Medicine

## 2021-08-21 ENCOUNTER — Telehealth: Payer: Self-pay | Admitting: Family Medicine

## 2021-08-21 ENCOUNTER — Encounter: Payer: Self-pay | Admitting: Family Medicine

## 2021-08-21 VITALS — BP 110/74 | Temp 97.9°F | Wt 259.8 lb

## 2021-08-21 DIAGNOSIS — Z79891 Long term (current) use of opiate analgesic: Secondary | ICD-10-CM

## 2021-08-21 DIAGNOSIS — E785 Hyperlipidemia, unspecified: Secondary | ICD-10-CM | POA: Diagnosis not present

## 2021-08-21 DIAGNOSIS — R5383 Other fatigue: Secondary | ICD-10-CM | POA: Diagnosis not present

## 2021-08-21 DIAGNOSIS — G894 Chronic pain syndrome: Secondary | ICD-10-CM

## 2021-08-21 DIAGNOSIS — G35 Multiple sclerosis: Secondary | ICD-10-CM | POA: Diagnosis not present

## 2021-08-21 MED ORDER — HYDROMORPHONE HCL 4 MG PO TABS: ORAL_TABLET | ORAL | 0 refills | Status: DC

## 2021-08-21 MED ORDER — ONDANSETRON 8 MG PO TBDP
8.0000 mg | ORAL_TABLET | Freq: Three times a day (TID) | ORAL | 4 refills | Status: DC | PRN
Start: 2021-08-21 — End: 2021-10-31

## 2021-08-21 NOTE — Progress Notes (Signed)
? ?Subjective:  ? ? Patient ID: Melinda Moore, female    DOB: [birth year], 51 y.o.   MRN: [id number] ? ?HPI ?This patient was seen today for chronic pain ? ?The medication list was reviewed and updated. ? ?Location of Pain for which the patient has been treated with regarding narcotics:  ? ?Onset of this pain:  ? ? -Compliance with medication: Dilaudid 4 mg ? ?- Number patient states they take daily: 2.5-3 ? ?-when was the last dose patient took? Noon today ? ?The patient was advised the importance of maintaining medication and not using illegal substances with these. ? ?Here for refills and follow up ? ?The patient was educated that we can provide 3 monthly scripts for their medication, it is their responsibility to follow the instructions. ? ?Side effects or complications from medications: none ? ?Patient is aware that pain medications are meant to minimize the severity of the pain to allow their pain levels to improve to allow for better function. They are aware of that pain medications cannot totally remove their pain. ? ?Due for UDT ( at least once per year) : last completed 02/22/21 ? ?Scale of 1 to 10 ( 1 is least 10 is most) ?Your pain level without the medicine: 10 ?Your pain level with medication: 5 ? ?Scale 1 to 10 ( 1-helps very little, 10 helps very well) ?How well does your pain medication reduce your pain so you can function better through out the day? 9 ? ?Quality of the pain: She describes as a burning pain discomfort worse in the legs to some degree in the arms related to the MS ? ?Persistence of the pain: Present all the time ? ?Modifying factors: Worse with activity ? ?Pt is going to start a new med for her MS and neuro is wanting to know if PCP will send this in. Refill on Zofran and handicap place card filled out  ?   ? ? ?Review of Systems ? ?   ?Objective:  ? Physical Exam ?General-in no acute distress ?Eyes-no discharge ?Lungs-respiratory rate normal, CTA ?CV-no murmurs,RRR ?Extremities skin warm  dry no edema ?Neuro grossly normal ?Behavior normal, alert ?Subjective discomfort in both legs ? ? ? ?   ?Assessment & Plan:  ?Chronic pain ?The patient was seen in followup for chronic pain. ?A review over at their current pain status was discussed. Drug registry was checked. ?Prescriptions were given.  Regular follow-up recommended. ?Discussion was held regarding the importance of compliance with medication as well as pain medication contract. ? ?Patient was informed that medication may cause drowsiness and should not be combined  with other medications/alcohol or street drugs. If the patient feels medication is causing altered alertness then do not drive or operate dangerous equipment. ? ?Should be noted that the patient appears to be meeting appropriate use of opioids and response.  Evidenced by improved function and decent pain control without significant side effects and no evidence of overt aberrancy issues.  Upon discussion with the patient today they understand that opioid therapy is optional and they feel that the pain has been refractory to reasonable conservative measures and is significant and affecting quality of life enough to warrant ongoing therapy and wishes to continue opioids.  Refills were provided. ?She states she will be is starting to taper down on her pain medicine hopefully getting down to no more than 2 or 3/day ?Follow-up in 3 months ?MS-progressive-her neurologist would like for her to do Solu-Medrol 1000 mg IV once  daily for 3 straight days next week when it is 2 weeks after her most recent medicine ?They are switching her over to a new medicine ?Once again Solu-Medrol recommended by neurologist patient aware we are trying to get approval so it can be infused here at [facility] which would save her a trip to [facility] ?

## 2021-08-21 NOTE — Telephone Encounter (Signed)
[facility] outpatient infusion center-SoluMedrol 1000 mg IV q day Wednesday, Thursday, Friday. Unsure how to proceed with order, contacted Eber Jones scheduler for [facility] short Stay but she has left for the day [phone]). Left detailed message to return call. Pt will also call provider in [place] to see when she is begin to take these IV. Pt med list with orders at nurse station.  ?

## 2021-08-23 ENCOUNTER — Ambulatory Visit: Payer: 59 | Admitting: Family Medicine

## 2021-08-24 NOTE — Telephone Encounter (Signed)
Short stay center at [facility] is not open on Friday. Infusion center in [place] [phone] ?Contacted patient- pt states infusions do not have to be on Wed, Thurs, Fri-it just has to be 3 days in a row. Pt spoke with neuro in [place] yesterday and they are waiting on insurance to approve the medication and then it has to be 2 weeks after that. Pt will call back to office once she finds out if med is covered.  ?

## 2021-08-24 NOTE — Telephone Encounter (Signed)
Message sent to referral coordinator

## 2021-09-06 NOTE — Progress Notes (Signed)
? ?Referring Provider: Kathyrn Drown, MD ?Primary Care Physician:  Kathyrn Drown, MD ?Primary Gastroenterologist:  Dr. Gala Romney ? ?Chief Complaint  ?Patient presents with  ? Colonoscopy  ? ? ?HPI:   ?Melinda Moore is a 51 y.o. female presenting today at the request of Luking, Elayne Snare, MD for consult colonoscopy.  ? ?Chronic mild anemia with baseline hemoglobin in the 11 range dating back at least to 2009, Low iron and saturation in August 2014 with ferritin low normal. 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 Biopsy from the stomach revealed minimal chronic inactive inflammation but negative for H. Pylori. Previously recommended colonoscopy in 2014, but this wasn't completed. Most recent labs in December 2022 with hemoglobin 12.6 with normocytic indices.  Ferritin 233 in August 2021. ? ?History of GERD and constipation.  Previously reported stool softeners, MiraLAX, vegetable laxative not helpful.  Amitiza caused cramping.  Recommended starting Linzess 290 mcg daily in 2014. ? ?History of elevated alk phos. we saw patient during admission in August 2021 for elevated LFTs when she was admitted for respiratory failure secondary to COVID-pneumonia, nausea, vomiting. Alk phos was elevated at 132, AST and ALT elevated in the 100s, GGT elevated in the 200s.  Ultrasound with echogenic liver, no biliary dilation, gallbladder absent.  LFTs were improving on serial labs.  Suspect that elevation may be secondary to acute illness, steatohepatitis, med effect.  Recommended continuing to monitor.  Alk phos had normalized, but most recent labs in December 2022 with alk phos mildly elevated at 141, otherwise transaminases and T. bili within normal limits. ? ? ?Today: ?No prior colonoscopy.  ?Paternal uncle with history of colon cancer. Sisters have had polyps. Dad also with history of polyps.  ? ?Constipation: ?Taking  Linzess 145 mcg daily. Well controlled. No brbpr or melena. Weight has been stable. No abdominal pain.  ? ?GERD:  ?Taking Protonix 40 mg daily. Some breakthrough daily. Will drink ginger ale which helps. Associated nausea without vomiting. No significant dysaphia.  ? ?Hx of anemia: ?No GI bleeding, hematuria, vaginal bleeding, or epistaxis.  History of menorrhagia, but had hysterectomy sometime between 2005-2010. Takes multivitamin and bariatric vitamins that contain iron.  ? ?Elevated Alk phos:  ?Takes Advil dial action- (contains Tylenol and ibuprofen). 1 in the am and 2 at night.  ?No alcohol.  ?No illicit drug use.  ? ?MS starting to affect walking and standing at times.  ? ?Past Medical History:  ?Diagnosis Date  ? Anemia   ? Anxiety   ? Bell's palsy   ? Depression   ? DVT (deep venous thrombosis) ([place]) 09/02/2012  ? [place] on 02/13/2012 showed acute DVT in left calf veins and posterior tibial veins  ? Fibromyalgia   ? GERD (gastroesophageal reflux disease)   ? History of blood transfusion 2000  ? History of trigeminal neuralgia   ? HTN (hypertension)   ? patient denies was taking a blood pressure medication in early 2000 but it was migraines  ? Leukopenia   ? MS (multiple sclerosis) ([place]) 1997  ? Neuropathic pain   ? Peripheral edema   ? Port catheter in place 12/31/2012  ? Right bundle branch block   ? ? ?Past Surgical History:  ?Procedure Laterality Date  ? ABDOMINAL HYSTERECTOMY    ? CHOLECYSTECTOMY    ? ESOPHAGOGASTRODUODENOSCOPY  2011  ? Dr. Gala Romney. 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 Biopsy from the stomach revealed minimal chronic inactive inflammation but negative for H. pylori  ? LAPAROSCOPIC GASTRIC SLEEVE RESECTION N/A 04/17/2016  ? Procedure: LAPAROSCOPIC GASTRIC SLEEVE RESECTION WITH UPPER ENDOSCOPY;  Surgeon: Excell Seltzer, MD;  Location: [facility];  Service: General;  Laterality: N/A;  ? PATELLA-FEMORAL  ARTHROPLASTY Right 07/14/2019  ? Procedure: PATELLA-FEMORAL ARTHROPLASTY;  Surgeon: Marchia Bond, MD;  Location: [facility];  Service: Orthopedics;  Laterality: Right;  ? PORTACATH PLACEMENT    ? TUBAL LIGATION  2001  ? ? ?Current Outpatient Medications  ?Medication Sig Dispense Refill  ? ascorbic acid (VITAMIN C) 500 MG tablet Take 1 tablet (500 mg total) by mouth daily. 30 tablet 0  ? Bioflavonoid Products (BIOFLEX) TABS Take 2 tablets by mouth daily.    ? buPROPion (WELLBUTRIN SR) 150 MG 12 hr tablet Take 1 tablet (150 mg total) by mouth 2 (two) times daily. 180 tablet 1  ? carbamazepine (TEGRETOL XR) 200 MG 12 hr tablet Take 1 tablet (200 mg total) by mouth 3 (three) times daily as needed. 90 tablet 1  ? Cholecalciferol (VITAMIN D3) 125 MCG (5000 UT) TABS Take 20,000 Units by mouth 2 (two) times daily.    ? gabapentin (NEURONTIN) 100 MG capsule Take by mouth.    ? HYDROmorphone (DILAUDID) 4 MG tablet Take 1/2 to 1 tablet qid prn , maximum use per day is 3-1/2 tablets 105 tablet 0  ? HYDROmorphone (DILAUDID) 4 MG tablet Take 1/2 to 1 tablet TID - QID PRN maximum 3-1/2 tablets 105 tablet 0  ? linaclotide (LINZESS) 145 MCG CAPS capsule Take 1 capsule (145 mcg total) by mouth daily before breakfast. 30 capsule 5  ? LUTEIN PO Take 1 tablet by mouth daily.    ? Multiple Vitamin (MULTIVITAMIN WITH MINERALS) TABS tablet Take 1 tablet by mouth daily. ALIVE    ? Multiple Vitamins-Minerals (EMERGEN-C IMMUNE) PACK Take 1 packet by mouth once a week.    ? naloxone (NARCAN) nasal spray 4 mg/0.1 mL Use as directed if accidental overdose 1 each 2  ? ondansetron (ZOFRAN-ODT) 8 MG disintegrating tablet Take 1 tablet (8 mg total) by mouth every 8 (eight) hours as needed. 10 tablet 4  ? pantoprazole (PROTONIX) 40 MG tablet Take 1 tablet (40 mg total) by mouth 2 (two) times daily before a meal. 60 tablet 5  ? Polyethylene Glycol 400 0.25 % SOLN Place 1 drop into the right eye 2 (two) times daily as needed (dry eyes).    ? potassium  chloride SA (KLOR-CON M20) 20 MEQ tablet TAKE ONE TABLET (20MEQ TOTAL) BY MOUTH DAILY 30 tablet 5  ? sertraline (ZOLOFT) 50 MG tablet TAKE ONE-HALF TABLET BY MOUTH TWICE A DAY 30 tablet 5  ? torsemide (DEMADEX) 20 MG tablet Take 1/2 to 1  tablet po q am prn pedal edema 30 tablet 5  ? zinc sulfate 220 (50 Zn) MG capsule Take 1 capsule (220 mg total) by mouth daily. 30 capsule 0  ? ?No current facility-administered medications for this visit.  ? ? ?Allergies as of 09/07/2021 - Review Complete 09/07/2021  ?Allergen Reaction Noted  ? Ambien [zolpidem tartrate]  02/08/2014  ? Tape Itching and Rash 03/26/2011  ? ? ?Family History  ?Problem Relation Age of Onset  ? Healthy Mother   ? Heart disease Father   ? Hyperlipidemia Father   ? Congestive Heart Failure Father   ? Hypertension Sister   ? Colon polyps Sister   ?  Colon polyps Sister   ? Colon polyps Sister   ? Colon polyps Sister   ? Cirrhosis Brother   ?     etoh  ? Healthy Daughter   ? Healthy Daughter   ? Colon cancer Paternal Uncle   ? ? ?Social History  ? ?Socioeconomic History  ? Marital status: Married  ?  Spouse name: Remo Lipps  ? Number of children: 2  ? Years of education: some college  ? Highest education level: Not on file  ?Occupational History  ? Occupation: disabled  ?Tobacco Use  ? Smoking status: Former  ?  Packs/day: 0.25  ?  Types: Cigarettes  ?  Quit date: 62  ?  Years since quitting: 33.2  ? Smokeless tobacco: Never  ? Tobacco comments:  ?  4 a day, quit in 1990  ?Vaping Use  ? Vaping Use: Never used  ?Substance and Sexual Activity  ? Alcohol use: No  ? Drug use: No  ? Sexual activity: Not on file  ?Other Topics Concern  ? Not on file  ?Social History Narrative  ? Lives at home with husband and 2 daughters. 3 grand daughters.   ? Right handed  ? Takes 1/2 caffeine pill per day  ? ?Social Determinants of Health  ? ?Financial Resource Strain: Low Risk   ? Difficulty of Paying Living Expenses: Not hard at all  ?Food Insecurity: No Food Insecurity  ?  Worried About Charity fundraiser in the Last Year: Never true  ? Ran Out of Food in the Last Year: Never true  ?Transportation Needs: No Transportation Needs  ? Lack of Transportation (Medical): No  ? Lack

## 2021-09-06 NOTE — H&P (View-Only) (Signed)
? ?Referring Provider: Kathyrn Drown, MD ?Primary Care Physician:  Kathyrn Drown, MD ?Primary Gastroenterologist:  Dr. Gala Romney ? ?Chief Complaint  ?Patient presents with  ? Colonoscopy  ? ? ?HPI:   ?Melinda Moore is a 51 y.o. female presenting today at the request of Luking, Elayne Snare, MD for consult colonoscopy.  ? ?Chronic mild anemia with baseline hemoglobin in the 11 range dating back at least to 2009, Low iron and saturation in August 2014 with ferritin low normal. 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 Biopsy from the stomach revealed minimal chronic inactive inflammation but negative for H. Pylori. Previously recommended colonoscopy in 2014, but this wasn't completed. Most recent labs in December 2022 with hemoglobin 12.6 with normocytic indices.  Ferritin 233 in August 2021. ? ?History of GERD and constipation.  Previously reported stool softeners, MiraLAX, vegetable laxative not helpful.  Amitiza caused cramping.  Recommended starting Linzess 290 mcg daily in 2014. ? ?History of elevated alk phos. we saw patient during admission in August 2021 for elevated LFTs when she was admitted for respiratory failure secondary to COVID-pneumonia, nausea, vomiting. Alk phos was elevated at 132, AST and ALT elevated in the 100s, GGT elevated in the 200s.  Ultrasound with echogenic liver, no biliary dilation, gallbladder absent.  LFTs were improving on serial labs.  Suspect that elevation may be secondary to acute illness, steatohepatitis, med effect.  Recommended continuing to monitor.  Alk phos had normalized, but most recent labs in December 2022 with alk phos mildly elevated at 141, otherwise transaminases and T. bili within normal limits. ? ? ?Today: ?No prior colonoscopy.  ?Paternal uncle with history of colon cancer. Sisters have had polyps. Dad also with history of polyps.  ? ?Constipation: ?Taking  Linzess 145 mcg daily. Well controlled. No brbpr or melena. Weight has been stable. No abdominal pain.  ? ?GERD:  ?Taking Protonix 40 mg daily. Some breakthrough daily. Will drink ginger ale which helps. Associated nausea without vomiting. No significant dysaphia.  ? ?Hx of anemia: ?No GI bleeding, hematuria, vaginal bleeding, or epistaxis.  History of menorrhagia, but had hysterectomy sometime between 2005-2010. Takes multivitamin and bariatric vitamins that contain iron.  ? ?Elevated Alk phos:  ?Takes Advil dial action- (contains Tylenol and ibuprofen). 1 in the am and 2 at night.  ?No alcohol.  ?No illicit drug use.  ? ?MS starting to affect walking and standing at times.  ? ?Past Medical History:  ?Diagnosis Date  ? Anemia   ? Anxiety   ? Bell's palsy   ? Depression   ? DVT (deep venous thrombosis) ([place]) 09/02/2012  ? [place] on 02/13/2012 showed acute DVT in left calf veins and posterior tibial veins  ? Fibromyalgia   ? GERD (gastroesophageal reflux disease)   ? History of blood transfusion 2000  ? History of trigeminal neuralgia   ? HTN (hypertension)   ? patient denies was taking a blood pressure medication in early 2000 but it was migraines  ? Leukopenia   ? MS (multiple sclerosis) ([place]) 1997  ? Neuropathic pain   ? Peripheral edema   ? Port catheter in place 12/31/2012  ? Right bundle branch block   ? ? ?Past Surgical History:  ?Procedure Laterality Date  ? ABDOMINAL HYSTERECTOMY    ? CHOLECYSTECTOMY    ? ESOPHAGOGASTRODUODENOSCOPY  2011  ? Dr. Gala Romney. 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 Biopsy from the stomach revealed minimal chronic inactive inflammation but negative for H. pylori  ? LAPAROSCOPIC GASTRIC SLEEVE RESECTION N/A 04/17/2016  ? Procedure: LAPAROSCOPIC GASTRIC SLEEVE RESECTION WITH UPPER ENDOSCOPY;  Surgeon: Excell Seltzer, MD;  Location: [facility];  Service: General;  Laterality: N/A;  ? PATELLA-FEMORAL  ARTHROPLASTY Right 07/14/2019  ? Procedure: PATELLA-FEMORAL ARTHROPLASTY;  Surgeon: Marchia Bond, MD;  Location: [facility];  Service: Orthopedics;  Laterality: Right;  ? PORTACATH PLACEMENT    ? TUBAL LIGATION  2001  ? ? ?Current Outpatient Medications  ?Medication Sig Dispense Refill  ? ascorbic acid (VITAMIN C) 500 MG tablet Take 1 tablet (500 mg total) by mouth daily. 30 tablet 0  ? Bioflavonoid Products (BIOFLEX) TABS Take 2 tablets by mouth daily.    ? buPROPion (WELLBUTRIN SR) 150 MG 12 hr tablet Take 1 tablet (150 mg total) by mouth 2 (two) times daily. 180 tablet 1  ? carbamazepine (TEGRETOL XR) 200 MG 12 hr tablet Take 1 tablet (200 mg total) by mouth 3 (three) times daily as needed. 90 tablet 1  ? Cholecalciferol (VITAMIN D3) 125 MCG (5000 UT) TABS Take 20,000 Units by mouth 2 (two) times daily.    ? gabapentin (NEURONTIN) 100 MG capsule Take by mouth.    ? HYDROmorphone (DILAUDID) 4 MG tablet Take 1/2 to 1 tablet qid prn , maximum use per day is 3-1/2 tablets 105 tablet 0  ? HYDROmorphone (DILAUDID) 4 MG tablet Take 1/2 to 1 tablet TID - QID PRN maximum 3-1/2 tablets 105 tablet 0  ? linaclotide (LINZESS) 145 MCG CAPS capsule Take 1 capsule (145 mcg total) by mouth daily before breakfast. 30 capsule 5  ? LUTEIN PO Take 1 tablet by mouth daily.    ? Multiple Vitamin (MULTIVITAMIN WITH MINERALS) TABS tablet Take 1 tablet by mouth daily. ALIVE    ? Multiple Vitamins-Minerals (EMERGEN-C IMMUNE) PACK Take 1 packet by mouth once a week.    ? naloxone (NARCAN) nasal spray 4 mg/0.1 mL Use as directed if accidental overdose 1 each 2  ? ondansetron (ZOFRAN-ODT) 8 MG disintegrating tablet Take 1 tablet (8 mg total) by mouth every 8 (eight) hours as needed. 10 tablet 4  ? pantoprazole (PROTONIX) 40 MG tablet Take 1 tablet (40 mg total) by mouth 2 (two) times daily before a meal. 60 tablet 5  ? Polyethylene Glycol 400 0.25 % SOLN Place 1 drop into the right eye 2 (two) times daily as needed (dry eyes).    ? potassium  chloride SA (KLOR-CON M20) 20 MEQ tablet TAKE ONE TABLET (20MEQ TOTAL) BY MOUTH DAILY 30 tablet 5  ? sertraline (ZOLOFT) 50 MG tablet TAKE ONE-HALF TABLET BY MOUTH TWICE A DAY 30 tablet 5  ? torsemide (DEMADEX) 20 MG tablet Take 1/2 to 1  tablet po q am prn pedal edema 30 tablet 5  ? zinc sulfate 220 (50 Zn) MG capsule Take 1 capsule (220 mg total) by mouth daily. 30 capsule 0  ? ?No current facility-administered medications for this visit.  ? ? ?Allergies as of 09/07/2021 - Review Complete 09/07/2021  ?Allergen Reaction Noted  ? Ambien [zolpidem tartrate]  02/08/2014  ? Tape Itching and Rash 03/26/2011  ? ? ?Family History  ?Problem Relation Age of Onset  ? Healthy Mother   ? Heart disease Father   ? Hyperlipidemia Father   ? Congestive Heart Failure Father   ? Hypertension Sister   ? Colon polyps Sister   ?  Colon polyps Sister   ? Colon polyps Sister   ? Colon polyps Sister   ? Cirrhosis Brother   ?     etoh  ? Healthy Daughter   ? Healthy Daughter   ? Colon cancer Paternal Uncle   ? ? ?Social History  ? ?Socioeconomic History  ? Marital status: Married  ?  Spouse name: Remo Lipps  ? Number of children: 2  ? Years of education: some college  ? Highest education level: Not on file  ?Occupational History  ? Occupation: disabled  ?Tobacco Use  ? Smoking status: Former  ?  Packs/day: 0.25  ?  Types: Cigarettes  ?  Quit date: 34  ?  Years since quitting: 33.2  ? Smokeless tobacco: Never  ? Tobacco comments:  ?  4 a day, quit in 1990  ?Vaping Use  ? Vaping Use: Never used  ?Substance and Sexual Activity  ? Alcohol use: No  ? Drug use: No  ? Sexual activity: Not on file  ?Other Topics Concern  ? Not on file  ?Social History Narrative  ? Lives at home with husband and 2 daughters. 3 grand daughters.   ? Right handed  ? Takes 1/2 caffeine pill per day  ? ?Social Determinants of Health  ? ?Financial Resource Strain: Low Risk   ? Difficulty of Paying Living Expenses: Not hard at all  ?Food Insecurity: No Food Insecurity  ?  Worried About Charity fundraiser in the Last Year: Never true  ? Ran Out of Food in the Last Year: Never true  ?Transportation Needs: No Transportation Needs  ? Lack of Transportation (Medical): No  ? Lack

## 2021-09-07 ENCOUNTER — Telehealth: Payer: Self-pay | Admitting: *Deleted

## 2021-09-07 ENCOUNTER — Encounter: Payer: Self-pay | Admitting: Gastroenterology

## 2021-09-07 ENCOUNTER — Ambulatory Visit (INDEPENDENT_AMBULATORY_CARE_PROVIDER_SITE_OTHER): Payer: 59 | Admitting: Gastroenterology

## 2021-09-07 VITALS — BP 129/78 | HR 92 | Temp 97.7°F | Ht 69.0 in | Wt 259.6 lb

## 2021-09-07 DIAGNOSIS — K219 Gastro-esophageal reflux disease without esophagitis: Secondary | ICD-10-CM | POA: Insufficient documentation

## 2021-09-07 DIAGNOSIS — Z1211 Encounter for screening for malignant neoplasm of colon: Secondary | ICD-10-CM

## 2021-09-07 DIAGNOSIS — K5900 Constipation, unspecified: Secondary | ICD-10-CM | POA: Diagnosis not present

## 2021-09-07 DIAGNOSIS — R748 Abnormal levels of other serum enzymes: Secondary | ICD-10-CM | POA: Diagnosis not present

## 2021-09-07 MED ORDER — PANTOPRAZOLE SODIUM 40 MG PO TBEC: 40.0000 mg | DELAYED_RELEASE_TABLET | Freq: Two times a day (BID) | ORAL | 5 refills | Status: DC

## 2021-09-07 MED ORDER — CLENPIQ 10-3.5-12 MG-GM -GM/160ML PO SOLN: 1.0000 | Freq: Once | ORAL | 0 refills | Status: AC

## 2021-09-07 NOTE — Telephone Encounter (Signed)
Pt returned call. She has been scheduled for 4/19 at 8am. Aware will mail prep instrucitons. Pt requested low volume prep. Also aware needs BMP prior to procedure, hold MVI 7 days prior. ? ?PA approved via [employer]. Auth# [id number], DOS: Sep 27, 2021 - Dec 26, 2021 ?PA submitted via [facility] for medicare. Auth# [id number], DOS: Sep 27, 2021 - Dec 26, 2021 ?

## 2021-09-07 NOTE — Patient Instructions (Signed)
We will arrange for a colonoscopy in the near future with Dr. Gala Romney. ?Hold your multivitamins that contain iron for 7 days prior to your procedure. ? ?Please have blood work completed at your convenience in the next week.  Be sure to fast prior to completing the blood work. ? ?Increase Protonix to 40 mg twice daily 30 minutes before breakfast and dinner for acid reflux.  I have sent in a prescription to your pharmacy. ? ?Follow a GERD diet:  ?Avoid fried, fatty, greasy, spicy, citrus foods. ?Avoid caffeine and carbonated beverages. ?Avoid chocolate. ?Try eating 4-6 small meals a day rather than 3 large meals. ?Do not eat within 3 hours of laying down. ?Prop head of bed up on wood or bricks to create a 6 inch incline. ? ?Continue Linzess 145 mcg daily for constipation. ? ?We will follow-up with you in the office after your colonoscopy.  Do not hesitate to call if you have any questions or concerns prior to your next visit. ? ?Aliene Altes, PA-C ?[facility] ? ?

## 2021-09-07 NOTE — Telephone Encounter (Signed)
LMOVM to call back to schedule TCS with propofol, Dr. Jena Gauss asa 2, wants low volume prep, hold MVI w/ Iron x 7 days, BMP at pre-op ?

## 2021-09-11 ENCOUNTER — Other Ambulatory Visit: Payer: Self-pay | Admitting: Family Medicine

## 2021-09-11 ENCOUNTER — Other Ambulatory Visit (HOSPITAL_COMMUNITY): Payer: Self-pay

## 2021-09-11 MED ORDER — METHYLPREDNISOLONE SODIUM SUCC 1000 MG IJ SOLR
1000.0000 mg | INTRAMUSCULAR | 0 refills | Status: DC
  Filled 2021-09-11: qty 1, fill #0

## 2021-09-11 NOTE — Telephone Encounter (Signed)
Pt returned call today and states that she is ready to start Solumedrol 1000 mg IV every day. Contacted [facility] but they no longer do infusions. Referring to [facility] [phone]). Contacted [facility] and per receptionist; placed orders in Epic and the pharmacist will push through PA and once that is completed they will contact patient to have them set up appt. Solumedrol order placed in Epic and pt is aware.  ?

## 2021-09-11 NOTE — Telephone Encounter (Signed)
The purpose of this IV Solu-Medrol is to treat her MS they are in the process of making changes on her medications and do not want a relapse-she is followed by a specialist at [facility].  Their goal was to have the infusion here because it is difficult for the patient to travel to [facility] frequently ?

## 2021-09-25 ENCOUNTER — Other Ambulatory Visit (HOSPITAL_COMMUNITY)
Admission: RE | Admit: 2021-09-25 | Discharge: 2021-09-25 | Disposition: A | Discharge status: Home / Self Care | Payer: 59 | Source: Ambulatory Visit | Attending: Internal Medicine | Admitting: Internal Medicine

## 2021-09-25 ENCOUNTER — Telehealth: Payer: Self-pay | Admitting: Internal Medicine

## 2021-09-25 DIAGNOSIS — Z1211 Encounter for screening for malignant neoplasm of colon: Secondary | ICD-10-CM | POA: Diagnosis present

## 2021-09-25 LAB — BASIC METABOLIC PANEL
Anion gap: 9 (ref 5–15)
BUN: 7 mg/dL (ref 6–20)
CO2: 26 mmol/L (ref 22–32)
Calcium: 8.9 mg/dL (ref 8.9–10.3)
Chloride: 104 mmol/L (ref 98–111)
Creatinine, Ser: 1 mg/dL (ref 0.44–1.00)
GFR, Estimated: >60 mL/min (ref >60)
Glucose, Bld: 104 mg/dL — ABNORMAL HIGH (ref 70–99)
Potassium: 3.9 mmol/L (ref 3.5–5.1)
Sodium: 139 mmol/L (ref 135–145)

## 2021-09-25 NOTE — Telephone Encounter (Signed)
Pt will go have lab work done today ?

## 2021-09-25 NOTE — Telephone Encounter (Signed)
PATIENT CALLED AND SAID THAT SHE FORGOT TO HAVE HER LABS DONE.  DOES SHE STILL HAVE HER PROCEDURE?  PLEASE CALL HER ?

## 2021-09-27 ENCOUNTER — Other Ambulatory Visit: Payer: Self-pay | Admitting: Pharmacy Technician

## 2021-09-27 ENCOUNTER — Other Ambulatory Visit: Payer: Self-pay | Admitting: Family Medicine

## 2021-09-27 ENCOUNTER — Ambulatory Visit (INDEPENDENT_AMBULATORY_CARE_PROVIDER_SITE_OTHER): Payer: 59 | Admitting: Nurse Practitioner

## 2021-09-27 ENCOUNTER — Encounter (HOSPITAL_COMMUNITY): Payer: Self-pay | Admitting: Internal Medicine

## 2021-09-27 ENCOUNTER — Telehealth: Payer: Self-pay | Admitting: Pharmacy Technician

## 2021-09-27 ENCOUNTER — Ambulatory Visit (HOSPITAL_COMMUNITY): Payer: 59 | Admitting: Certified Registered"

## 2021-09-27 ENCOUNTER — Encounter: Payer: Self-pay | Admitting: Internal Medicine

## 2021-09-27 ENCOUNTER — Encounter: Payer: Self-pay | Admitting: Nurse Practitioner

## 2021-09-27 ENCOUNTER — Ambulatory Visit (HOSPITAL_BASED_OUTPATIENT_CLINIC_OR_DEPARTMENT_OTHER): Payer: 59 | Admitting: Certified Registered"

## 2021-09-27 ENCOUNTER — Other Ambulatory Visit: Payer: Self-pay

## 2021-09-27 ENCOUNTER — Ambulatory Visit (HOSPITAL_COMMUNITY)
Admission: RE | Admit: 2021-09-27 | Discharge: 2021-09-27 | Disposition: A | Discharge status: Home / Self Care | Payer: 59 | Attending: Internal Medicine | Admitting: Internal Medicine

## 2021-09-27 ENCOUNTER — Encounter: Payer: Self-pay | Admitting: Family Medicine

## 2021-09-27 ENCOUNTER — Encounter (HOSPITAL_COMMUNITY)
Admission: RE | Disposition: A | Discharge status: Home / Self Care | Payer: Self-pay | Source: Home / Self Care | Attending: Internal Medicine

## 2021-09-27 VITALS — BP 105/66 | HR 66 | Temp 97.4°F | Ht 69.0 in | Wt 256.0 lb

## 2021-09-27 DIAGNOSIS — K219 Gastro-esophageal reflux disease without esophagitis: Secondary | ICD-10-CM | POA: Insufficient documentation

## 2021-09-27 DIAGNOSIS — G35 Multiple sclerosis: Secondary | ICD-10-CM

## 2021-09-27 DIAGNOSIS — Z79899 Other long term (current) drug therapy: Secondary | ICD-10-CM | POA: Insufficient documentation

## 2021-09-27 DIAGNOSIS — R8290 Unspecified abnormal findings in urine: Secondary | ICD-10-CM

## 2021-09-27 DIAGNOSIS — D649 Anemia, unspecified: Secondary | ICD-10-CM | POA: Diagnosis not present

## 2021-09-27 DIAGNOSIS — Z8616 Personal history of COVID-19: Secondary | ICD-10-CM | POA: Diagnosis not present

## 2021-09-27 DIAGNOSIS — Z8371 Family history of colonic polyps: Secondary | ICD-10-CM | POA: Diagnosis not present

## 2021-09-27 DIAGNOSIS — M797 Fibromyalgia: Secondary | ICD-10-CM | POA: Insufficient documentation

## 2021-09-27 DIAGNOSIS — F419 Anxiety disorder, unspecified: Secondary | ICD-10-CM | POA: Diagnosis not present

## 2021-09-27 DIAGNOSIS — Z1211 Encounter for screening for malignant neoplasm of colon: Secondary | ICD-10-CM | POA: Diagnosis present

## 2021-09-27 DIAGNOSIS — Z87891 Personal history of nicotine dependence: Secondary | ICD-10-CM | POA: Diagnosis not present

## 2021-09-27 DIAGNOSIS — I10 Essential (primary) hypertension: Secondary | ICD-10-CM | POA: Insufficient documentation

## 2021-09-27 DIAGNOSIS — F32A Depression, unspecified: Secondary | ICD-10-CM | POA: Diagnosis not present

## 2021-09-27 DIAGNOSIS — F418 Other specified anxiety disorders: Secondary | ICD-10-CM

## 2021-09-27 DIAGNOSIS — Z800 Family history of malignant neoplasm of digestive organs: Secondary | ICD-10-CM | POA: Insufficient documentation

## 2021-09-27 HISTORY — PX: COLONOSCOPY WITH PROPOFOL: SHX5780

## 2021-09-27 SURGERY — COLONOSCOPY WITH PROPOFOL
Anesthesia: General

## 2021-09-27 MED ORDER — LACTATED RINGERS IV SOLN: INTRAVENOUS | Status: DC

## 2021-09-27 MED ORDER — PROPOFOL 500 MG/50ML IV EMUL
INTRAVENOUS | Status: DC | PRN
  Administered 2021-09-27: 150 ug/kg/min via INTRAVENOUS

## 2021-09-27 MED ORDER — CEFDINIR 300 MG PO CAPS: 300.0000 mg | ORAL_CAPSULE | Freq: Two times a day (BID) | ORAL | 0 refills | Status: AC

## 2021-09-27 MED ORDER — LIDOCAINE 2% (20 MG/ML) 5 ML SYRINGE
INTRAMUSCULAR | Status: DC | PRN
  Administered 2021-09-27: 50 mg via INTRAVENOUS

## 2021-09-27 MED ORDER — PROPOFOL 500 MG/50ML IV EMUL
INTRAVENOUS | Status: AC
  Filled 2021-09-27: qty 50

## 2021-09-27 MED ORDER — PROPOFOL 10 MG/ML IV BOLUS
INTRAVENOUS | Status: AC
  Filled 2021-09-27: qty 20

## 2021-09-27 MED ORDER — PROPOFOL 10 MG/ML IV BOLUS
INTRAVENOUS | Status: DC | PRN
Start: 2021-09-27 — End: 2021-09-27
  Administered 2021-09-27: 100 mg via INTRAVENOUS

## 2021-09-27 MED ORDER — FLUCONAZOLE 150 MG PO TABS: 150.0000 mg | ORAL_TABLET | Freq: Every day | ORAL | 0 refills | Status: DC

## 2021-09-27 MED ORDER — LIDOCAINE HCL (PF) 2 % IJ SOLN
INTRAMUSCULAR | Status: AC
  Filled 2021-09-27: qty 5

## 2021-09-27 NOTE — Transfer of Care (Signed)
Immediate Anesthesia Transfer of Care Note ? ?Patient: Melinda Moore ? ?Procedure(s) Performed: COLONOSCOPY WITH PROPOFOL ? ?Patient Location: Endoscopy Unit ? ?Anesthesia Type:MAC ? ?Level of Consciousness: sedated, patient cooperative and responds to stimulation ? ?Airway & Oxygen Therapy: Patient Spontanous Breathing and Patient connected to nasal cannula oxygen ? ?Post-op Assessment: Report given to RN and Post -op Vital signs reviewed and stable ? ?Post vital signs: Reviewed and stable ? ?Last Vitals:  ?Vitals Value Taken Time  ?BP 109/69 09/27/21 0812  ?Temp 36.9 ?C 09/27/21 0812  ?Pulse    ?Resp 16 09/27/21 0812  ?SpO2 95 % 09/27/21 0812  ? ? ?Last Pain:  ?Vitals:  ? 09/27/21 0812  ?TempSrc: Oral  ?PainSc: 0-No pain  ?   ? ?Patients Stated Pain Goal: 4 (09/27/21 5003) ? ?Complications: No notable events documented. ?

## 2021-09-27 NOTE — Anesthesia Preprocedure Evaluation (Signed)
Anesthesia Evaluation  ?Patient identified by MRN, date of birth, ID band ?Patient awake ? ? ? ?Reviewed: ?Allergy & Precautions, H&P , NPO status , Patient's Chart, lab work & pertinent test results, reviewed documented beta blocker date and time  ? ?Airway ?Mallampati: II ? ?TM Distance: >3 FB ?Neck ROM: full ? ? ? Dental ?no notable dental hx. ? ?  ?Pulmonary ?neg pulmonary ROS, former smoker,  ?  ?Pulmonary exam normal ?breath sounds clear to auscultation ? ? ? ? ? ? Cardiovascular ?Exercise Tolerance: Good ?hypertension, negative cardio ROS ? ? ?Rhythm:regular Rate:Normal ? ? ?  ?Neuro/Psych ? Headaches, PSYCHIATRIC DISORDERS Anxiety Depression  Neuromuscular disease   ? GI/Hepatic ?Neg liver ROS, GERD  Medicated,  ?Endo/Other  ?negative endocrine ROS ? Renal/GU ?negative Renal ROS  ?negative genitourinary ?  ?Musculoskeletal ? ? Abdominal ?  ?Peds ? Hematology ? ?(+) Blood dyscrasia, anemia ,   ?Anesthesia Other Findings ? ? Reproductive/Obstetrics ?negative OB ROS ? ?  ? ? ? ? ? ? ? ? ? ? ? ? ? ?  ?  ? ? ? ? ? ? ? ? ?Anesthesia Physical ?Anesthesia Plan ? ?ASA: 3 ? ?Anesthesia Plan: General  ? ?Post-op Pain Management:   ? ?Induction:  ? ?PONV Risk Score and Plan: Propofol infusion ? ?Airway Management Planned:  ? ?Additional Equipment:  ? ?Intra-op Plan:  ? ?Post-operative Plan:  ? ?Informed Consent: I have reviewed the patients History and Physical, chart, labs and discussed the procedure including the risks, benefits and alternatives for the proposed anesthesia with the patient or authorized representative who has indicated his/her understanding and acceptance.  ? ? ? ?Dental Advisory Given ? ?Plan Discussed with: CRNA ? ?Anesthesia Plan Comments:   ? ? ? ? ? ? ?Anesthesia Quick Evaluation ? ?

## 2021-09-27 NOTE — Discharge Instructions (Signed)
?  Colonoscopy ?Discharge Instructions ? ?Read the instructions outlined below and refer to this sheet in the next few weeks. These discharge instructions provide you with general information on caring for yourself after you leave the hospital. Your doctor may also give you specific instructions. While your treatment has been planned according to the most current medical practices available, unavoidable complications occasionally occur. If you have any problems or questions after discharge, call Dr. Gala Romney at [phone]. ?ACTIVITY ?You may resume your regular activity, but move at a slower pace for the next 24 hours.  ?Take frequent rest periods for the next 24 hours.  ?Walking will help get rid of the air and reduce the bloated feeling in your belly (abdomen).  ?No driving for 24 hours (because of the medicine (anesthesia) used during the test).   ?Do not sign any important legal documents or operate any machinery for 24 hours (because of the anesthesia used during the test).  ?NUTRITION ?Drink plenty of fluids.  ?You may resume your normal diet as instructed by your doctor.  ?Begin with a light meal and progress to your normal diet. Heavy or fried foods are harder to digest and may make you feel sick to your stomach (nauseated).  ?Avoid alcoholic beverages for 24 hours or as instructed.  ?MEDICATIONS ?You may resume your normal medications unless your doctor tells you otherwise.  ?WHAT YOU CAN EXPECT TODAY ?Some feelings of bloating in the abdomen.  ?Passage of more gas than usual.  ?Spotting of blood in your stool or on the toilet paper.  ?IF YOU HAD POLYPS REMOVED DURING THE COLONOSCOPY: ?No aspirin products for 7 days or as instructed.  ?No alcohol for 7 days or as instructed.  ?Eat a soft diet for the next 24 hours.  ?FINDING OUT THE RESULTS OF YOUR TEST ?Not all test results are available during your visit. If your test results are not back during the visit, make an appointment with your caregiver to find out the  results. Do not assume everything is normal if you have not heard from your caregiver or the medical facility. It is important for you to follow up on all of your test results.  ?SEEK IMMEDIATE MEDICAL ATTENTION IF: ?You have more than a spotting of blood in your stool.  ?Your belly is swollen (abdominal distention).  ?You are nauseated or vomiting.  ?You have a temperature over 101.  ?You have abdominal pain or discomfort that is severe or gets worse throughout the day. ? ? ? ? ?Colon was not cleaned out.  Colonoscopy could not be completed. ? ?Discussed with Fredric Dine at [phone] ? ?My office will be in touch to reschedule. ? ? ? ? ? ? ? ? ?

## 2021-09-27 NOTE — Telephone Encounter (Addendum)
Dr. Gerda Diss, ?Fyi note: ? ? ?Auth Submission: no auth needed ?Payer: [employer] ?Medication & CPT/J Code(s) submitted: Solumedrol (Methylprednisolone) J2930 ?Route of submission (phone, fax, portal): PORTAL ?Auth type: Buy/Bill ?Units/visits requested: X3 DOSES ?Reference number: [id number] ?Approval from: 09/27/21 to 09/28/22 ? ?Patient will be scheduled as soon as possible. ?Kim ?

## 2021-09-27 NOTE — Op Note (Signed)
[facility] ?Patient Name: Melinda Moore ?Procedure Date: 09/27/2021 7:52 AM ?MRN: [id number] ?Date of Birth: Jan 30, 1971 ?Attending MD: [name] , MD ?CSN: [id number] ?Age: 51 ?Admit Type: Outpatient ?Procedure:                Colonoscopy ?Indications:              Colon cancer screening in patient at increased  ?                          risk: Family history of colon polyps in multiple  ?                          1st-degree relatives ?Providers:                [name], MD, Caprice Kluver, Nelma Rothman,  ?                          Technician ?Referring MD:              ?Medicines:                Propofol per Anesthesia ?Complications:            No immediate complications. ?Estimated Blood Loss:     Estimated blood loss: none. ?Procedure:                Pre-Anesthesia Assessment: ?                          - Prior to the procedure, a History and Physical  ?                          was performed, and patient medications and  ?                          allergies were reviewed. The patient's tolerance of  ?                          previous anesthesia was also reviewed. The risks  ?                          and benefits of the procedure and the sedation  ?                          options and risks were discussed with the patient.  ?                          All questions were answered, and informed consent  ?                          was obtained. Prior Anticoagulants: The patient has  ?                          taken no previous anticoagulant or antiplatelet  ?                          agents. ASA  Grade Assessment: II - A patient with  ?                          mild systemic disease. After reviewing the risks  ?                          and benefits, the patient was deemed in  ?                          satisfactory condition to undergo the procedure. ?                          After obtaining informed consent, the colonoscope  ?                          was passed under direct vision. Throughout  the  ?                          procedure, the patient's blood pressure, pulse, and  ?                          oxygen saturations were monitored continuously. The  ?                          [id number]) scope was introduced through the  ?                          anus with the intention of advancing to the  ?                          surgical stoma. The scope was advanced to the  ?                          rectum before the procedure was aborted.  ?                          Medications were given. The quality of the bowel  ?                          preparation was inadequate. ?Scope In: 8:03:12 AM ?Scope Out: 8:05:15 AM ?Total Procedure Duration: 0 hours 2 minutes 3 seconds  ?Findings: ?     The perianal and digital rectal examinations were normal. Viscous thick  ?     liquid and semiformed stool in the rectum trending up into the sigmoid  ?     colon. Prep grossly inadequate. Procedure aborted. ?Impression:               - Preparation of the colon was inadequate. ?                          -Attempted complete colonoscopy aborted. ?Moderate Sedation: ?     Moderate (conscious) sedation was personally administered by an  ?     anesthesia professional. The following parameters were monitored: oxygen  ?     saturation, heart rate, blood pressure, respiratory  rate, EKG, adequacy  ?     of pulmonary ventilation, and response to care. ?Recommendation:           - Patient has a contact number available for  ?                          emergencies. The signs and symptoms of potential  ?                          delayed complications were discussed with the  ?                          patient. Return to normal activities tomorrow.  ?                          Written discharge instructions were provided to the  ?                          patient. ?                          - Advance diet as tolerated. ?                          - Continue present medications. ?                          - Repeat colonoscopy at the next  available  ?                          appointment because the bowel preparation was  ?                          suboptimal. ?                          - Return to GI office (date not yet determined). ?Procedure Code(s):        --- Professional --- ?                          [phone], 53, Colonoscopy, flexible; diagnostic,  ?                          including collection of specimen(s) by brushing or  ?                          washing, when performed (separate procedure) ?Diagnosis Code(s):        --- Professional --- ?                          Z83.71, Family history of colonic polyps ?CPT copyright 2019 [employer]. All rights reserved. ?The codes documented in this report are preliminary and upon coder review may  ?be revised to meet current compliance requirements. ?[name] Stefhanie Kachmar, MD ?[name], MD ?09/27/2021 8:21:48 AM ?This report has been signed electronically. ?Number of Addenda: 0 ?

## 2021-09-27 NOTE — Progress Notes (Signed)
? ?  Subjective:  ? ? Patient ID: Melinda Moore, female    DOB: 1971-05-18, 51 y.o.   MRN: [id number] ? ?HPI ? ?51 year old female with significant medical history reports to clinic today with complaints of strong urine odor x 5 days.  Patient denies frequency or urgency or dysuria.  Patient states that she has been taking Azo which has not been helpful.  Patient denies any fevers, body aches, back pain, chills. ? ?Review of Systems  ?Genitourinary:   ?     Urinary odor  ? ?   ?Objective:  ? Physical Exam ?Constitutional:   ?   General: She is not in acute distress. ?   Appearance: Normal appearance. She is obese. She is not ill-appearing, toxic-appearing or diaphoretic.  ?HENT:  ?   Head: Normocephalic and atraumatic.  ?Cardiovascular:  ?   Rate and Rhythm: Normal rate and regular rhythm.  ?   Pulses: Normal pulses.  ?   Heart sounds: Normal heart sounds. No murmur heard. ?Pulmonary:  ?   Effort: Pulmonary effort is normal. No respiratory distress.  ?   Breath sounds: Normal breath sounds. No wheezing.  ?Abdominal:  ?   Tenderness: There is no right CVA tenderness or left CVA tenderness.  ?Musculoskeletal:  ?   Comments: Grossly intact at baseline.  Patient ambulates with cane  ?Skin: ?   General: Skin is warm.  ?   Capillary Refill: Capillary refill takes less than 2 seconds.  ?Neurological:  ?   Mental Status: She is alert.  ?   Comments: At baseline and grossly intact  ?Psychiatric:     ?   Mood and Affect: Mood normal.     ?   Behavior: Behavior normal.  ? ?   ?Assessment & Plan:  ?1. Abnormal urine odor ?-Suspect UTI ?- POCT urinalysis dipstick = UA nonconclusive due to presence of Azo ?-We will treat patient with cefdinir.  Due to side effect profile of nitrofurantoin and Bactrim and her comorbidities decided to use cefdinir instead. ?-Patient also supplied with Diflucan in case she develops a yeast infection. ?- cefdinir (OMNICEF) 300 MG capsule; Take 1 capsule (300 mg total) by mouth 2 (two) times daily for 5  days.  Dispense: 10 capsule; Refill: 0 ?- fluconazole (DIFLUCAN) 150 MG tablet; Take 1 tablet (150 mg total) by mouth daily.  Dispense: 1 tablet; Refill: 0 ?- Urine Culture ?-Return to clinic if symptoms are not better within 2 to 3 days of antibiotic use. ?-Return to clinic if symptoms get worsen or persist ?-May have to alter antibiotics after I received results of urine culture.  Patient stated understanding ? ?  ?Note:  This document was prepared using Dragon voice recognition software and may include unintentional dictation errors. ? ? ? ?

## 2021-09-27 NOTE — Anesthesia Postprocedure Evaluation (Signed)
Anesthesia Post Note ? ?Patient: Melinda Moore ? ?Procedure(s) Performed: COLONOSCOPY WITH PROPOFOL ? ?Patient location during evaluation: Phase II ?Anesthesia Type: General ?Level of consciousness: awake ?Pain management: pain level controlled ?Vital Signs Assessment: post-procedure vital signs reviewed and stable ?Respiratory status: spontaneous breathing and respiratory function stable ?Cardiovascular status: blood pressure returned to baseline and stable ?Postop Assessment: no headache and no apparent nausea or vomiting ?Anesthetic complications: no ?Comments: Late entry ? ? ?No notable events documented. ? ? ?Last Vitals:  ?Vitals:  ? 09/27/21 0812 09/27/21 [id number]  ?BP: 109/69 113/70  ?Pulse:    ?Resp: 16 16  ?Temp: 36.9 ?C   ?SpO2: 95% 100%  ?  ?Last Pain:  ?Vitals:  ? 09/27/21 0812  ?TempSrc: Oral  ?PainSc: 0-No pain  ? ? ?  ?  ?  ?  ?  ?  ? ?Louann Sjogren ? ? ? ? ?

## 2021-09-27 NOTE — Interval H&P Note (Signed)
History and Physical Interval Note: ? ?09/27/2021 ?7:47 AM ? ?Melinda Moore  has presented today for surgery, with the diagnosis of screening.  The various methods of treatment have been discussed with the patient and family. After consideration of risks, benefits and other options for treatment, the patient has consented to  Procedure(s) with comments: ?COLONOSCOPY WITH PROPOFOL (N/A) - 8:00am, asa 2 as a surgical intervention.  The patient's history has been reviewed, patient examined, no change in status, stable for surgery.  I have reviewed the patient's chart and labs.  Questions were answered to the patient's satisfaction.   ? ? ?Melinda Moore ? ?No change.  Patient here for first ever colonoscopy.  It is notable that she does have a maternal uncle with history of colon cancer age unknown and both her father and 2 sisters have a history of colon polyps.Marland Kitchen ?I have offered the patient a screening colonoscopy today per plan. ?The risks, benefits, limitations, alternatives and imponderables have been reviewed with the patient. Questions have been answered. All parties are agreeable.   ?

## 2021-09-28 ENCOUNTER — Encounter: Payer: Self-pay | Admitting: Nurse Practitioner

## 2021-09-28 ENCOUNTER — Other Ambulatory Visit: Payer: Self-pay

## 2021-09-28 NOTE — Progress Notes (Unsigned)
n

## 2021-09-30 LAB — SPECIMEN STATUS REPORT

## 2021-09-30 LAB — URINE CULTURE

## 2021-10-02 ENCOUNTER — Ambulatory Visit (INDEPENDENT_AMBULATORY_CARE_PROVIDER_SITE_OTHER): Payer: 59

## 2021-10-02 VITALS — BP 113/77 | HR 80 | Temp 98.2°F | Resp 18 | Ht 69.0 in | Wt 254.8 lb

## 2021-10-02 DIAGNOSIS — G35 Multiple sclerosis: Secondary | ICD-10-CM

## 2021-10-02 MED ORDER — SODIUM CHLORIDE 0.9 % IV SOLN
1000.0000 mg | Freq: Once | INTRAVENOUS | Status: AC
  Administered 2021-10-02: 1000 mg via INTRAVENOUS
  Filled 2021-10-02: qty 16

## 2021-10-02 NOTE — Progress Notes (Signed)
Diagnosis: Multiple Sclerosis ? ?Provider:  Chilton Greathouse, MD ? ?Procedure: Infusion ? ?IV Type: Peripheral, IV Location: L Antecubital ? ?Solumedrol (Methylprednisolone), Dose: 1000 mg ? ?Infusion Start Time: [phone] ? ?Infusion Stop Time: 1026 ? ?Post Infusion IV Care: Peripheral IV Discontinued ? ?Discharge: Condition: Good, Destination: Home . AVS provided to patient.  ? ?Performed by:  Nat Math, RN  ?  ?

## 2021-10-03 ENCOUNTER — Ambulatory Visit (INDEPENDENT_AMBULATORY_CARE_PROVIDER_SITE_OTHER): Payer: 59

## 2021-10-03 ENCOUNTER — Encounter (HOSPITAL_COMMUNITY): Payer: Self-pay | Admitting: Internal Medicine

## 2021-10-03 VITALS — BP 112/74 | HR 92 | Temp 97.8°F | Resp 18 | Ht 69.0 in | Wt 255.0 lb

## 2021-10-03 DIAGNOSIS — G35 Multiple sclerosis: Secondary | ICD-10-CM | POA: Diagnosis not present

## 2021-10-03 MED ORDER — SODIUM CHLORIDE 0.9 % IV SOLN
1000.0000 mg | Freq: Once | INTRAVENOUS | Status: AC
  Administered 2021-10-03: 1000 mg via INTRAVENOUS
  Filled 2021-10-03: qty 16

## 2021-10-03 NOTE — Progress Notes (Signed)
Diagnosis: Multiple Sclerosis ? ?Provider:  Chilton Greathouse, MD ? ?Procedure: Infusion ? ?IV Type: Peripheral, IV Location: L Antecubital ? ?Solumedrol (Methylprednisolone), Dose: 1000 mg ? ?Infusion Start Time: 1045 ? ?Infusion Stop Time: 1155 ? ?Post Infusion IV Care: Peripheral IV Discontinued ? ?Discharge: Condition: Good, Destination: Home . AVS provided to patient.  ? ?Performed by:  Adriana Mccallum, RN  ?  ?

## 2021-10-04 ENCOUNTER — Ambulatory Visit (INDEPENDENT_AMBULATORY_CARE_PROVIDER_SITE_OTHER): Payer: 59

## 2021-10-04 VITALS — BP 129/85 | HR 85 | Temp 97.8°F | Resp 16 | Ht 69.0 in | Wt 256.2 lb

## 2021-10-04 DIAGNOSIS — G35 Multiple sclerosis: Secondary | ICD-10-CM

## 2021-10-04 MED ORDER — SODIUM CHLORIDE 0.9 % IV SOLN
1000.0000 mg | Freq: Once | INTRAVENOUS | Status: AC
  Administered 2021-10-04: 1000 mg via INTRAVENOUS
  Filled 2021-10-04: qty 16

## 2021-10-04 NOTE — Progress Notes (Signed)
Diagnosis: Multiple Sclerosis ? ?Provider:  Chilton Greathouse, MD ? ?Procedure: Infusion ? ?IV Type: Peripheral, IV Location: L Antecubital ? ?Solumedrol (Methylprednisolone), Dose: 1000 mg ? ?Infusion Start Time: 1100 ? ?Infusion Stop Time: 1200 pm ? ?Post Infusion IV Care: Peripheral IV Discontinued ? ?Discharge: Condition: Good, Destination: Home . AVS provided to patient.  ? ?Performed by:  Adriana Mccallum, RN  ?  ?

## 2021-10-09 ENCOUNTER — Ambulatory Visit: Payer: Self-pay

## 2021-10-16 ENCOUNTER — Ambulatory Visit: Payer: Self-pay

## 2021-10-19 NOTE — Progress Notes (Deleted)
GI Office Note    Referring Provider: Kathyrn Drown, MD Primary Care Physician:  Kathyrn Drown, MD Primary GI: Dr. Gala Romney  Date:  10/19/2021  ID:  Melinda Moore, DOB October 03, 1970, MRN [id number]   Chief Complaint   No chief complaint on file.    History of Present Illness  Melinda Moore is a 51 y.o. female presenting today with need to reschedule colonoscopy due to poor prep.   Patient has chronic mild anemia with baseline hemoglobin in the 11 range.  History of low iron and saturation in August 2014.  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 pylori.  Patient has history of elevated alkaline phosphatase who was seen during admission to the hospital August 2021 elevated LFTs.  She had ultrasound with echogenic liver no biliary ductal dilation, gallbladder absent.  LFTs improved as acute illness improved.  Alk phos had normalized but was mildly elevated to 141 in December 2022 with other normal LFTs.  Last seen in the office 09/07/2021 -reported history of colon cancer in her paternal uncle, sisters and her dad have history of polyps.  She was taking Linzess 145 mcg daily with constipation well controlled.  GERD well-controlled on Protonix 40 mg daily with intermittent breakthrough daily and some nausea without vomiting.  Denies any history of bleeding, maintain on multivitamin and bariatric vitamins with iron as she has history of gastric sleeve.  She did admit to taking Advil dual action daily but denied alcohol or drug use.  She was scheduled for her procedure.  Advised to take Linzess 145 mcg daily, increase Protonix to 40 mg twice daily and to have fasting hepatic function panel which has not been completed.  She presented to [facility] for her colonoscopy on 09/27/2021 but was unable to have her procedure performed as she had an inadequate prep.  She called the office on  09/2021 to request to have her colonoscopy rescheduled and to send in Clenpiq prep again due to gastric sleeve history  Today: How did you do your prep previously?  GERD:  Constipation:    Past Medical History:  Diagnosis Date   Anemia    Anxiety    Bell's palsy    Depression    DVT (deep venous thrombosis) ([place]) 09/02/2012   [place] on 02/13/2012 showed acute DVT in left calf veins and posterior tibial veins   Fibromyalgia    GERD (gastroesophageal reflux disease)    History of blood transfusion 2000   History of trigeminal neuralgia    HTN (hypertension)    patient denies was taking a blood pressure medication in early 2000 but it was migraines   Leukopenia    MS (multiple sclerosis) ([place]) 1997   Neuropathic pain    Peripheral edema    Port catheter in place 12/31/2012   Right bundle branch block     Past Surgical History:  Procedure Laterality Date   ABDOMINAL HYSTERECTOMY     CHOLECYSTECTOMY     COLONOSCOPY WITH PROPOFOL N/A 09/27/2021   Procedure: COLONOSCOPY WITH PROPOFOL;  Surgeon: Daneil Dolin, MD;  Location: [facility];  Service: Endoscopy;  Laterality: N/A;  8:00am, asa 2   ESOPHAGOGASTRODUODENOSCOPY  2011   Dr. Gala Romney. 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 Biopsy from the stomach revealed minimal chronic inactive inflammation but negative for H. pylori  LAPAROSCOPIC GASTRIC SLEEVE RESECTION N/A 04/17/2016   Procedure: LAPAROSCOPIC GASTRIC SLEEVE RESECTION WITH UPPER ENDOSCOPY;  Surgeon: Excell Seltzer, MD;  Location: [facility];  Service: General;  Laterality: N/A;   PATELLA-FEMORAL ARTHROPLASTY Right 07/14/2019   Procedure: PATELLA-FEMORAL ARTHROPLASTY;  Surgeon: Marchia Bond, MD;  Location: [facility] OR[facility];  Service: Orthopedics;  Laterality: Right;   PORTACATH PLACEMENT     TUBAL LIGATION  2001    Current Outpatient Medications  Medication Sig Dispense Refill    ascorbic acid (VITAMIN C) 500 MG tablet Take 1 tablet (500 mg total) by mouth daily. 30 tablet 0   Bioflavonoid Products (BIOFLEX) TABS Take 2 tablets by mouth daily.     buPROPion (WELLBUTRIN SR) 150 MG 12 hr tablet Take 1 tablet (150 mg total) by mouth 2 (two) times daily. 180 tablet 1   carbamazepine (TEGRETOL XR) 200 MG 12 hr tablet Take 1 tablet (200 mg total) by mouth 3 (three) times daily as needed. 90 tablet 1   Cholecalciferol (VITAMIN D3) 125 MCG (5000 UT) TABS Take 20,000 Units by mouth 2 (two) times daily.     fluconazole (DIFLUCAN) 150 MG tablet Take 1 tablet (150 mg total) by mouth daily. 1 tablet 0   gabapentin (NEURONTIN) 300 MG capsule Take 1,200 mg by mouth 3 (three) times daily.     HYDROmorphone (DILAUDID) 4 MG tablet Take 1/2 to 1 tablet qid prn , maximum use per day is 3-1/2 tablets 105 tablet 0   HYDROmorphone (DILAUDID) 4 MG tablet Take 1/2 to 1 tablet TID - QID PRN maximum 3-1/2 tablets (Patient taking differently: Take 2 mg by mouth every 4 (four) hours. Take 1/2 to 1 tablet TID - QID PRN maximum 3-1/2 tablets) 105 tablet 0   linaclotide (LINZESS) 145 MCG CAPS capsule Take 1 capsule (145 mcg total) by mouth daily before breakfast. (Patient taking differently: Take 145 mcg by mouth daily as needed (constipation).) 30 capsule 5   methylPREDNISolone sodium succinate (SOLU-MEDROL) 1000 MG injection Inject 1,000 mg into the vein See admin instructions. 1,000 mg IV q day for 3 days 1 each 0   Multiple Vitamin (MULTIVITAMIN WITH MINERALS) TABS tablet Take 1 tablet by mouth daily. ALIVE     Multiple Vitamins-Minerals (EMERGEN-C IMMUNE) PACK Take 1 packet by mouth once a week.     naloxone (NARCAN) nasal spray 4 mg/0.1 mL Use as directed if accidental overdose 1 each 2   ondansetron (ZOFRAN-ODT) 8 MG disintegrating tablet Take 1 tablet (8 mg total) by mouth every 8 (eight) hours as needed. 10 tablet 4   pantoprazole (PROTONIX) 40 MG tablet Take 1 tablet (40 mg total) by mouth 2 (two)  times daily before a meal. 60 tablet 5   potassium chloride SA (KLOR-CON M20) 20 MEQ tablet TAKE ONE TABLET (20MEQ TOTAL) BY MOUTH DAILY 30 tablet 5   sertraline (ZOLOFT) 50 MG tablet TAKE ONE-HALF TABLET BY MOUTH TWICE A DAY 30 tablet 5   torsemide (DEMADEX) 20 MG tablet Take 1/2 to 1  tablet po q am prn pedal edema (Patient taking differently: Take 10-20 mg by mouth daily as needed (edema). Take 1/2 to 1  tablet po q am prn pedal edema) 30 tablet 5   zinc sulfate 220 (50 Zn) MG capsule Take 1 capsule (220 mg total) by mouth daily. 30 capsule 0   No current facility-administered medications for this visit.    Allergies as of 10/23/2021 - Review Complete 09/28/2021  Allergen Reaction Noted   Ambien [zolpidem tartrate]  02/08/2014   Tape Itching and Rash 03/26/2011    Family History  Problem Relation Age of Onset   Healthy Mother    Heart disease Father    Hyperlipidemia Father    Congestive Heart Failure Father    Hypertension Sister    Colon polyps Sister    Colon polyps Sister    Colon polyps Sister    Colon polyps Sister    Cirrhosis Brother        etoh   Healthy Daughter    Healthy Daughter    Colon cancer Paternal Uncle     Social History   Socioeconomic History   Marital status: Married    Spouse name: Remo Lipps   Number of children: 2   Years of education: some college   Highest education level: Not on file  Occupational History   Occupation: disabled  Tobacco Use   Smoking status: Former    Packs/day: 0.25    Types: Cigarettes    Quit date: 1990    Years since quitting: 33.3   Smokeless tobacco: Never   Tobacco comments:    4 a day, quit in 1990  Vaping Use   Vaping Use: Never used  Substance and Sexual Activity   Alcohol use: No   Drug use: No   Sexual activity: Not on file  Other Topics Concern   Not on file  Social History Narrative   Lives at home with husband and 2 daughters. 3 grand daughters.    Right handed   Takes 1/2 caffeine pill per day    Social Determinants of Health   Financial Resource Strain: Low Risk    Difficulty of Paying Living Expenses: Not hard at all  Food Insecurity: No Food Insecurity   Worried About Charity fundraiser in the Last Year: Never true   Arboriculturist in the Last Year: Never true  Transportation Needs: No Transportation Needs   Lack of Transportation (Medical): No   Lack of Transportation (Non-Medical): No  Physical Activity: Inactive   Days of Exercise per Week: 0 days   Minutes of Exercise per Session: 0 min  Stress: No Stress Concern Present   Feeling of Stress : Not at all  Social Connections: Socially Integrated   Frequency of Communication with Friends and Family: More than three times a week   Frequency of Social Gatherings with Friends and Family: More than three times a week   Attends Religious Services: 1 to 4 times per year   Active Member of [employer] or Organizations: No   Attends Music therapist: 1 to 4 times per year   Marital Status: Married     Review of Systems   Gen: Denies fever, chills, anorexia. Denies fatigue, weakness, weight loss.  CV: Denies chest pain, palpitations, syncope, peripheral edema, and claudication. Resp: Denies dyspnea at rest, cough, wheezing, coughing up blood, and pleurisy. GI: see HPI Derm: Denies rash, itching, dry skin Psych: Denies depression, anxiety, memory loss, confusion. No homicidal or suicidal ideation.  Heme: Denies bruising, bleeding, and enlarged lymph nodes.   Physical Exam   There were no vitals taken for this visit.  General:   Alert and oriented. No distress noted. Pleasant and cooperative.  Head:  Normocephalic and atraumatic. Eyes:  Conjuctiva clear without scleral icterus. Mouth:  Oral mucosa pink and moist. Good dentition. No lesions. Lungs:  Clear to auscultation bilaterally. No wheezes, rales, or rhonchi. No distress.  Heart:  S1, S2 present without murmurs  appreciated.  Abdomen:  +BS, soft,  non-tender and non-distended. No rebound or guarding. No HSM or masses noted. Rectal: *** Msk:  Symmetrical without gross deformities. Normal posture. Extremities:  Without edema. Neurologic:  Alert and  oriented x4 Psych:  Alert and cooperative. Normal mood and affect.   Assessment  Melinda Moore is a 51 y.o. female with a history of MS, trigeminal neuralgia, fibromyalgia, HTN, anxiety, depression, remote history of DVT not currently on anticoagulation, chronic constipation, GERD, chronic mild normocytic anemia with history of IDA in 2014 normalized with multivitamin containing iron.  Presents today to discuss rescheduling screening colonoscopy due to poor prep.  Colon cancer screening:   GERD:  Elevated Alk Phos:   PLAN   *** Proceed with colonoscopy by Dr. Gala Romney in near future: the risks, benefits, and alternatives have been discussed with the patient in detail. The patient states understanding and desires to proceed. ASA 2.  Hold multivitamin if it contains iron for 7 days prior to procedure. Clenpiq prep due to gastric sleeve history BMET prior to procedure Extra half day of clears Linzess 290 mcg for 3 days prior to procedure, then resume 145 mcg daily.  Reinforce need to have fasting lab performed.     Venetia Night, MSN, FNP-BC, AGACNP-BC [facility]

## 2021-10-23 ENCOUNTER — Ambulatory Visit: Payer: 59 | Admitting: Gastroenterology

## 2021-10-23 ENCOUNTER — Encounter: Payer: Self-pay | Admitting: Internal Medicine

## 2021-10-30 NOTE — Progress Notes (Unsigned)
Referring Provider: Kathyrn Drown, MD Primary Care Physician:  Kathyrn Drown, MD  Primary GI: Dr. Gala Romney  Patient Location: Home   Provider Location: [facility] office   Reason for Visit:  reschedule colonoscopy due to poor prep   Persons present on the virtual encounter, with roles: patient Melinda Moore, Venetia Night, NP   Total time (minutes) spent on medical discussion: 15 minutes  Virtual Visit Visit is conducted virtually and was requested by patient.   I connected with Melinda Moore on 10/31/21 at  9:00 AM EDT by video and verified that I am speaking with the correct person using two identifiers.   I discussed the limitations, risks, security and privacy concerns of performing an evaluation and management service by video and the availability of in person appointments. I also discussed with the patient that there may be a patient responsible charge related to this service. The patient expressed understanding and agreed to proceed.  Chief Complaint  Patient presents with   Colonoscopy    Constipation     History of Present Illness:  Patient has chronic mild anemia with baseline hemoglobin in the 11 range, dating back to 2009.  History of low iron and saturation in August 2014.  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 pylori.   Patient has history of elevated alkaline phosphatase who was seen during admission to the hospital August 2021 with elevated LFTs.  She had ultrasound with echogenic liver no biliary ductal dilation, gallbladder absent.  LFTs improved as acute illness improved.  It was suspected the elevation was secondary to acute illness, steatohepatitis and medication side effects.  Alk phos had normalized but was mildly elevated to 141 in December 2022 with other normal LFTs.   Last seen in the office 09/07/2021 -reported history of colon cancer in her  paternal uncle, sisters and her dad have history of polyps.  She was taking Linzess 145 mcg daily with constipation well controlled.  GERD well-controlled on Protonix 40 mg daily with intermittent breakthrough daily and some nausea without vomiting.  Denies any history of bleeding, maintain on multivitamin and bariatric vitamins with iron as she has history of gastric sleeve.  She did admit to taking Advil dual action daily but denied alcohol or drug use.  She was scheduled for her procedure.  Advised to take Linzess 145 mcg daily, increase Protonix to 40 mg twice daily and to have fasting hepatic function panel which has not been completed.   She presented to [facility] for her colonoscopy on 09/27/2021 but was unable to have her procedure performed as she had an inadequate prep.  She called the office on 09/2021 to request to have her colonoscopy rescheduled and to send in Clenpiq prep again due to gastric sleeve history.   Today: How did you do your prep previously?- reports she doesn't think she drank enough water like she was supposed to   2017 had gastric sleeve and wanted the smaller bottles.    GERD/nausea: Has been taking the pantoprazole 40 mg BID and it has helped, does take some ginger ale to help settle her stomach as well and improve the nausea. Requesting Zofran, feels nauseated somewhat everyday, feels like she wants to burp but cant burp, sometimes the Zofran is the only thing that worse. Had reflux before the gastric sleeve but has worsened since her procedure. Denies dysphagia. No lack of appetite or increased  early satiety.    Constipation: Was taking the Linzess as needed. Having bowel movements about twice a week. Sometimes hard stools. Feeling bloated everyday. No melena or BRBPR. No abdominal pain. No diarrhea with constipation.    Past Medical History:  Diagnosis Date   Anemia    Anxiety    Bell's palsy    Depression    DVT (deep venous thrombosis) ([place]) 09/02/2012   [place] on  02/13/2012 showed acute DVT in left calf veins and posterior tibial veins   Fibromyalgia    GERD (gastroesophageal reflux disease)    History of blood transfusion 2000   History of trigeminal neuralgia    HTN (hypertension)    patient denies was taking a blood pressure medication in early 2000 but it was migraines   Leukopenia    MS (multiple sclerosis) ([place]) 1997   Neuropathic pain    Peripheral edema    Port catheter in place 12/31/2012   Right bundle branch block      Past Surgical History:  Procedure Laterality Date   ABDOMINAL HYSTERECTOMY     CHOLECYSTECTOMY     COLONOSCOPY WITH PROPOFOL N/A 09/27/2021   Procedure: COLONOSCOPY WITH PROPOFOL;  Surgeon: Daneil Dolin, MD;  Location: [facility];  Service: Endoscopy;  Laterality: N/A;  8:00am, asa 2   ESOPHAGOGASTRODUODENOSCOPY  2011   Dr. Gala Romney. 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 Biopsy from the stomach revealed minimal chronic inactive inflammation but negative for H. pylori   LAPAROSCOPIC GASTRIC SLEEVE RESECTION N/A 04/17/2016   Procedure: LAPAROSCOPIC GASTRIC SLEEVE RESECTION WITH UPPER ENDOSCOPY;  Surgeon: Excell Seltzer, MD;  Location: [facility] ORS;  Service: General;  Laterality: N/A;   PATELLA-FEMORAL ARTHROPLASTY Right 07/14/2019   Procedure: PATELLA-FEMORAL ARTHROPLASTY;  Surgeon: Marchia Bond, MD;  Location: [facility] ORS;  Service: Orthopedics;  Laterality: Right;   PORTACATH PLACEMENT     TUBAL LIGATION  2001     Current Meds  Medication Sig   ascorbic acid (VITAMIN C) 500 MG tablet Take 1 tablet (500 mg total) by mouth daily.   Bioflavonoid Products (BIOFLEX) TABS Take 2 tablets by mouth daily.   buPROPion (WELLBUTRIN SR) 150 MG 12 hr tablet Take 1 tablet (150 mg total) by mouth 2 (two) times daily.   Cholecalciferol (VITAMIN D3) 125 MCG (5000 UT) TABS Take 20,000 Units by mouth 2 (two) times daily.   gabapentin  (NEURONTIN) 300 MG capsule Take 1,200 mg by mouth 3 (three) times daily.   HYDROmorphone (DILAUDID) 4 MG tablet Take 1/2 to 1 tablet qid prn , maximum use per day is 3-1/2 tablets   methylPREDNISolone sodium succinate (SOLU-MEDROL) 1000 MG injection Inject 1,000 mg into the vein See admin instructions. 1,000 mg IV q day for 3 days   Multiple Vitamin (MULTIVITAMIN WITH MINERALS) TABS tablet Take 1 tablet by mouth daily. ALIVE   Multiple Vitamins-Minerals (EMERGEN-C IMMUNE) PACK Take 1 packet by mouth once a week.   naloxone (NARCAN) nasal spray 4 mg/0.1 mL Use as directed if accidental overdose   ondansetron (ZOFRAN) 8 MG tablet Take 1 tablet (8 mg total) by mouth 2 (two) times daily as needed for nausea or vomiting.   pantoprazole (PROTONIX) 40 MG tablet Take 1 tablet (40 mg total) by mouth 2 (two) times daily before a meal.   potassium chloride SA (KLOR-CON M20) 20 MEQ tablet TAKE ONE TABLET (20MEQ TOTAL) BY MOUTH DAILY   sertraline (ZOLOFT) 50 MG tablet TAKE  ONE-HALF TABLET BY MOUTH TWICE A DAY   torsemide (DEMADEX) 20 MG tablet Take 1/2 to 1  tablet po q am prn pedal edema (Patient taking differently: Take 10-20 mg by mouth daily as needed (edema). Take 1/2 to 1  tablet po q am prn pedal edema)   zinc sulfate 220 (50 Zn) MG capsule Take 1 capsule (220 mg total) by mouth daily.   [DISCONTINUED] linaclotide (LINZESS) 145 MCG CAPS capsule Take 1 capsule (145 mcg total) by mouth daily before breakfast. (Patient taking differently: Take 145 mcg by mouth daily as needed (constipation).)   [DISCONTINUED] ondansetron (ZOFRAN-ODT) 8 MG disintegrating tablet Take 1 tablet (8 mg total) by mouth every 8 (eight) hours as needed.     Family History  Problem Relation Age of Onset   Healthy Mother    Heart disease Father    Hyperlipidemia Father    Congestive Heart Failure Father    Hypertension Sister    Colon polyps Sister    Colon polyps Sister    Colon polyps Sister    Colon polyps Sister     Cirrhosis Brother        etoh   Healthy Daughter    Healthy Daughter    Colon cancer Paternal Uncle     Social History   Socioeconomic History   Marital status: Married    Spouse name: Remo Lipps   Number of children: 2   Years of education: some college   Highest education level: Not on file  Occupational History   Occupation: disabled  Tobacco Use   Smoking status: Former    Packs/day: 0.25    Types: Cigarettes    Quit date: 1990    Years since quitting: 33.4   Smokeless tobacco: Never   Tobacco comments:    4 a day, quit in 1990  Vaping Use   Vaping Use: Never used  Substance and Sexual Activity   Alcohol use: No   Drug use: No   Sexual activity: Not on file  Other Topics Concern   Not on file  Social History Narrative   Lives at home with husband and 2 daughters. 3 grand daughters.    Right handed   Takes 1/2 caffeine pill per day   Social Determinants of Health   Financial Resource Strain: Low Risk    Difficulty of Paying Living Expenses: Not hard at all  Food Insecurity: No Food Insecurity   Worried About Charity fundraiser in the Last Year: Never true   Arboriculturist in the Last Year: Never true  Transportation Needs: No Transportation Needs   Lack of Transportation (Medical): No   Lack of Transportation (Non-Medical): No  Physical Activity: Inactive   Days of Exercise per Week: 0 days   Minutes of Exercise per Session: 0 min  Stress: No Stress Concern Present   Feeling of Stress : Not at all  Social Connections: Socially Integrated   Frequency of Communication with Friends and Family: More than three times a week   Frequency of Social Gatherings with Friends and Family: More than three times a week   Attends Religious Services: 1 to 4 times per year   Active Member of [employer] or Organizations: No   Attends Music therapist: 1 to 4 times per year   Marital Status: Married       Review of Systems: Gen: Denies fever, chills, anorexia.  Denies fatigue, weakness, weight loss.  CV: Denies chest pain, palpitations, syncope, peripheral  edema, and claudication. Resp: Denies dyspnea at rest, cough, wheezing, coughing up blood, and pleurisy. GI: see HPI Derm: Denies rash, itching, dry skin Psych: Denies depression, anxiety, memory loss, confusion. No homicidal or suicidal ideation.  Heme: Denies bruising, bleeding, and enlarged lymph nodes.  Observations/Objective: No distress. Alert and oriented. Pleasant. Well nourished. Normal mood and affect. Unable to perform complete physical exam due to video encounter.   Assessment and Plan: TYWANNA SEIFER is a 51 y.o. female with a history of MS, trigeminal neuralgia, fibromyalgia, HTN, anxiety, depression, remote history of DVT not currently on anticoagulation, chronic constipation, GERD, chronic mild normocytic anemia with history of IDA in 2014 normalized with multivitamin containing iron.  Presents today to discuss rescheduling screening colonoscopy due to poor prep.  Colon cancer screening: This is her first colonoscopy.  She had failed prep with her first attempt.  She has history of chronic constipation which made prep difficult.  She stated that at the time of her colonoscopy she was only taking Linzess 145 mcg as needed.  She has a family history of colon cancer in her paternal uncle, 57 sisters, and her father had colon polyps.  We will proceed with another attempt at colonoscopy in the near future with Dr. Gala Romney.  Medication adjustments discussed in detail today and she will be provided separate written instructions.  She previously tried prep with Clenpiq given her gastric sleeve history, will try MoviPrep bowel prep this time.  She reported that she feels like she did not drink enough water as instructed with her previous prep.  Constipation: Patient suffers from chronic constipation.  Reportedly was only taking Linzess 145 mcg as needed.  She has been having bowel movements only about  twice a week, occasionally hard in nature.  Advised her to begin taking the Linzess 145 mcg daily to aid in more regularity in her bowel movements.  No alarm symptoms present.  Discussed with her taking 290 mcg daily for 3 days prior to her procedure to supplement her prep.   GERD/nausea: Patient has history of chronic GERD that has been worsened after gastric sleeve.  She previously was not well controlled on Protonix once daily, states that she has had improvement in her symptoms since taking Protonix twice daily.  She does complain of daily intermittent nausea and has been getting Zofran by her PCP.  She reports that she has been taking it sparingly on days when ginger ale does not seem to settle her stomach.  Discussed continued GERD diet/lifestyle modifications.  Advised her to continue taking her PPI twice daily.  Prescription for Zofran sent to her pharmacy.   Plan/Follow Up Instructions: Proceed with colonoscopy with propofol by Dr. Gala Romney in near future: the risks, benefits, and alternatives have been discussed with the patient in detail. The patient states understanding and desires to proceed. ASA 2.  Hold multivitamin if it contains iron for 7 days prior to procedure. Split Moviprep due to gastric sleeve history BMET prior to procedure Extra half day of clears Begin taking the Linzess 145 mcg daily before breakfast, refill sent Linzess 290 mcg for 3 days prior to procedure, then resume 145 mcg daily after procedure Continue Pantoprazole BID You may take Zofran 8 mg BID as needed for nausea.  Follow-up in 2 to 3 months post procedure.    I discussed the assessment and treatment plan with the patient. The patient was provided an opportunity to ask questions and all were answered. The patient agreed with the plan  and demonstrated an understanding of the instructions.   The patient was advised to call back or seek an in-person evaluation if the symptoms worsen or if the condition fails to  improve as anticipated.    Venetia Night, MSN, FNP-BC, AGACNP-BC [facility]

## 2021-10-31 ENCOUNTER — Encounter: Payer: Self-pay | Admitting: Gastroenterology

## 2021-10-31 ENCOUNTER — Telehealth: Payer: Self-pay

## 2021-10-31 ENCOUNTER — Telehealth: Payer: Self-pay | Admitting: *Deleted

## 2021-10-31 ENCOUNTER — Telehealth (INDEPENDENT_AMBULATORY_CARE_PROVIDER_SITE_OTHER): Payer: 59 | Admitting: Gastroenterology

## 2021-10-31 VITALS — Ht 69.0 in | Wt 235.0 lb

## 2021-10-31 DIAGNOSIS — R110 Nausea: Secondary | ICD-10-CM

## 2021-10-31 DIAGNOSIS — K219 Gastro-esophageal reflux disease without esophagitis: Secondary | ICD-10-CM | POA: Diagnosis not present

## 2021-10-31 DIAGNOSIS — K5900 Constipation, unspecified: Secondary | ICD-10-CM

## 2021-10-31 DIAGNOSIS — Z1211 Encounter for screening for malignant neoplasm of colon: Secondary | ICD-10-CM

## 2021-10-31 MED ORDER — LINACLOTIDE 145 MCG PO CAPS: 145.0000 ug | ORAL_CAPSULE | Freq: Every day | ORAL | 5 refills | Status: DC

## 2021-10-31 MED ORDER — ONDANSETRON HCL 8 MG PO TABS: 8.0000 mg | ORAL_TABLET | Freq: Two times a day (BID) | ORAL | 0 refills | Status: DC | PRN

## 2021-10-31 NOTE — Telephone Encounter (Signed)
Pt consented to a virtual visit. 

## 2021-10-31 NOTE — Patient Instructions (Signed)
We will schedule you for colonoscopy with propofol with Dr. Gala Romney in the near future.  You will need to hold your multivitamin for 7 days prior to procedure.  You will also need to take Linzess 290 mcg (2 of your 145 mcg tablets) daily for 3 days prior to procedure.  You may resume 1 tablet a day postprocedure.  We will also have you do an extra half a day of clear liquids.  Given your gastric sleeve procedure we will send in [place] bowel preparation. You will receive a separate handout in the mail regarding all of these instructions.  Continue to take your pantoprazole twice daily 30 minutes before breakfast and dinner.  I have sent in prescription for Zofran 8 mg for you to take twice daily as needed for nausea.  Please use this sparingly and you may continue to use ginger ale to help settle your stomach as needed.  We will have you follow-up in 2 to 3 months post procedure.  It was a pleasure to see you today. I want to create trusting relationships with patients. If you receive a survey regarding your visit,  I greatly appreciate you taking time to fill this out on paper or through your MyChart. I value your feedback.  Venetia Night, MSN, FNP-BC, AGACNP-BC [facility]

## 2021-10-31 NOTE — Telephone Encounter (Signed)
Called pt to schedule TCS ASA 2 w/Dr. Jena Gauss. She wants morning procedure and would like to wait until July schedule is available. She is aware we will call back to schedule.

## 2021-10-31 NOTE — Telephone Encounter (Signed)
Melinda Moore, you are scheduled for a virtual visit with your provider today.  Just as we do with appointments in the office, we must obtain your consent to participate.  Your consent will be active for this visit and any virtual visit you may have with one of our providers in the next 365 days.  If you have a MyChart account, I can also send a copy of this consent to you electronically.  All virtual visits are billed to your insurance company just like a traditional visit in the office.  As this is a virtual visit, video technology does not allow for your provider to perform a traditional examination.  This may limit your provider's ability to fully assess your condition.  If your provider identifies any concerns that need to be evaluated in person or the need to arrange testing such as labs, EKG, etc, we will make arrangements to do so.  Although advances in technology are sophisticated, we cannot ensure that it will always work on either your end or our end.  If the connection with a video visit is poor, we may have to switch to a telephone visit.  With either a video or telephone visit, we are not always able to ensure that we have a secure connection.   I need to obtain your verbal consent now.   Are you willing to proceed with your visit today?

## 2021-11-02 ENCOUNTER — Telehealth: Payer: Self-pay | Admitting: *Deleted

## 2021-11-02 DIAGNOSIS — Z1211 Encounter for screening for malignant neoplasm of colon: Secondary | ICD-10-CM

## 2021-11-02 MED ORDER — PEG-KCL-NACL-NASULF-NA ASC-C 100 G PO SOLR: 1.0000 | Freq: Once | ORAL | 0 refills | Status: AC

## 2021-11-02 NOTE — Telephone Encounter (Signed)
Called pt. She has been scheduled for TCS with Dr. Jena Gauss asa 2 on 7/21 at 10:45am. Aware will mail instructions and send rx for prep to pharmacy. She already has linzess at home.  PA approved via [employer] primary insurance. Auth# [id number], DOS: Dec 29, 2021 - Mar 29, 2022  PA done via [facility]. Auth# [id number], DOS: Dec 29, 2021 - Mar 29, 2022

## 2021-11-10 ENCOUNTER — Encounter (HOSPITAL_COMMUNITY): Payer: Self-pay | Admitting: *Deleted

## 2021-11-14 ENCOUNTER — Other Ambulatory Visit: Payer: Self-pay | Admitting: Family Medicine

## 2021-11-14 ENCOUNTER — Encounter: Payer: Self-pay | Admitting: Family Medicine

## 2021-11-14 ENCOUNTER — Ambulatory Visit (INDEPENDENT_AMBULATORY_CARE_PROVIDER_SITE_OTHER): Payer: 59 | Admitting: Family Medicine

## 2021-11-14 VITALS — BP 106/70 | HR 91 | Temp 97.7°F | Wt 255.8 lb

## 2021-11-14 DIAGNOSIS — G35 Multiple sclerosis: Secondary | ICD-10-CM

## 2021-11-14 DIAGNOSIS — M5116 Intervertebral disc disorders with radiculopathy, lumbar region: Secondary | ICD-10-CM | POA: Diagnosis not present

## 2021-11-14 DIAGNOSIS — Z79891 Long term (current) use of opiate analgesic: Secondary | ICD-10-CM | POA: Diagnosis not present

## 2021-11-14 DIAGNOSIS — E785 Hyperlipidemia, unspecified: Secondary | ICD-10-CM | POA: Diagnosis not present

## 2021-11-14 DIAGNOSIS — R5383 Other fatigue: Secondary | ICD-10-CM | POA: Diagnosis not present

## 2021-11-14 MED ORDER — HYDROMORPHONE HCL 4 MG PO TABS: ORAL_TABLET | ORAL | 0 refills | Status: DC

## 2021-11-14 NOTE — Progress Notes (Unsigned)
Subjective:    Patient ID: Melinda Moore, female    DOB: January 14, 1971, 51 y.o.   MRN: [id number]  HPI This patient was seen today for chronic pain  The medication list was reviewed and updated.  Location of Pain for which the patient has been treated with regarding narcotics:   Onset of this pain:    -Compliance with medication: Dilaudid 4 mg  - Number patient states they take daily: 3   -when was the last dose patient took? This morning around 8 am  The patient was advised the importance of maintaining medication and not using illegal substances with these.  Here for refills and follow up  The patient was educated that we can provide 3 monthly scripts for their medication, it is their responsibility to follow the instructions.  Side effects or complications from medications: none  Patient is aware that pain medications are meant to minimize the severity of the pain to allow their pain levels to improve to allow for better function. They are aware of that pain medications cannot totally remove their pain.  Due for UDT ( at least once per year) : completed 02/22/21  Scale of 1 to 10 ( 1 is least 10 is most) Your pain level without the medicine: 10 Your pain level with medication: 7 (Pt believes it is the sciatic pain and is trying to find relief for sciatic pain) Pt would like to start Physical Therapy back   Scale 1 to 10 ( 1-helps very little, 10 helps very well) How well does your pain medication reduce your pain so you can function better through out the day? 5  Quality of the pain:   Persistence of the pain:   Modifying factors:         Review of Systems     Objective:   Physical Exam  General-in no acute distress Eyes-no discharge Lungs-respiratory rate normal, CTA CV-no murmurs,RRR Extremities skin warm dry no edema Neuro grossly normal Behavior normal, alert       Assessment & Plan:  1. MS (multiple sclerosis) (HCC) Under care of specialist  having more weakness and some instability with walking recommend physical therapy - Ambulatory referral to Physical Therapy  2. Radiculopathy due to lumbar intervertebral disc disorder Patient does not wish to do further injections or surgery we will try physical therapy - Ambulatory referral to Physical Therapy  3. Encounter for long-term opiate analgesic use The patient was seen in followup for chronic pain. A review over at their current pain status was discussed. Drug registry was checked. Prescriptions were given.  Regular follow-up recommended. Discussion was held regarding the importance of compliance with medication as well as pain medication contract.  Patient was informed that medication may cause drowsiness and should not be combined  with other medications/alcohol or street drugs. If the patient feels medication is causing altered alertness then do not drive or operate dangerous equipment.  Should be noted that the patient appears to be meeting appropriate use of opioids and response.  Evidenced by improved function and decent pain control without significant side effects and no evidence of overt aberrancy issues.  Upon discussion with the patient today they understand that opioid therapy is optional and they feel that the pain has been refractory to reasonable conservative measures and is significant and affecting quality of life enough to warrant ongoing therapy and wishes to continue opioids.  Refills were provided.  - HYDROmorphone (DILAUDID) 4 MG tablet; Take 1/2 to 1 tablet  qid prn , maximum use per day is 3-1/2 tablets  Dispense: 105 tablet; Refill: 0  4. Hyperlipidemia, unspecified hyperlipidemia type Healthy diet regular activity recommended - HYDROmorphone (DILAUDID) 4 MG tablet; Take 1/2 to 1 tablet qid prn , maximum use per day is 3-1/2 tablets  Dispense: 105 tablet; Refill: 0  5. Other fatigue Mild fatigue related to MS - HYDROmorphone (DILAUDID) 4 MG tablet; Take  1/2 to 1 tablet qid prn , maximum use per day is 3-1/2 tablets  Dispense: 105 tablet; Refill: 0

## 2021-11-30 IMAGING — DX DG CHEST 1V PORT
1 series · 1 of 1 positions shown · non-contrast
Comparison: Chest radiograph dated 10/25/2015.

CLINICAL DATA: 49-year-old female with positive [id number].

EXAM:
PORTABLE CHEST 1 VIEW

[chest ap grid]
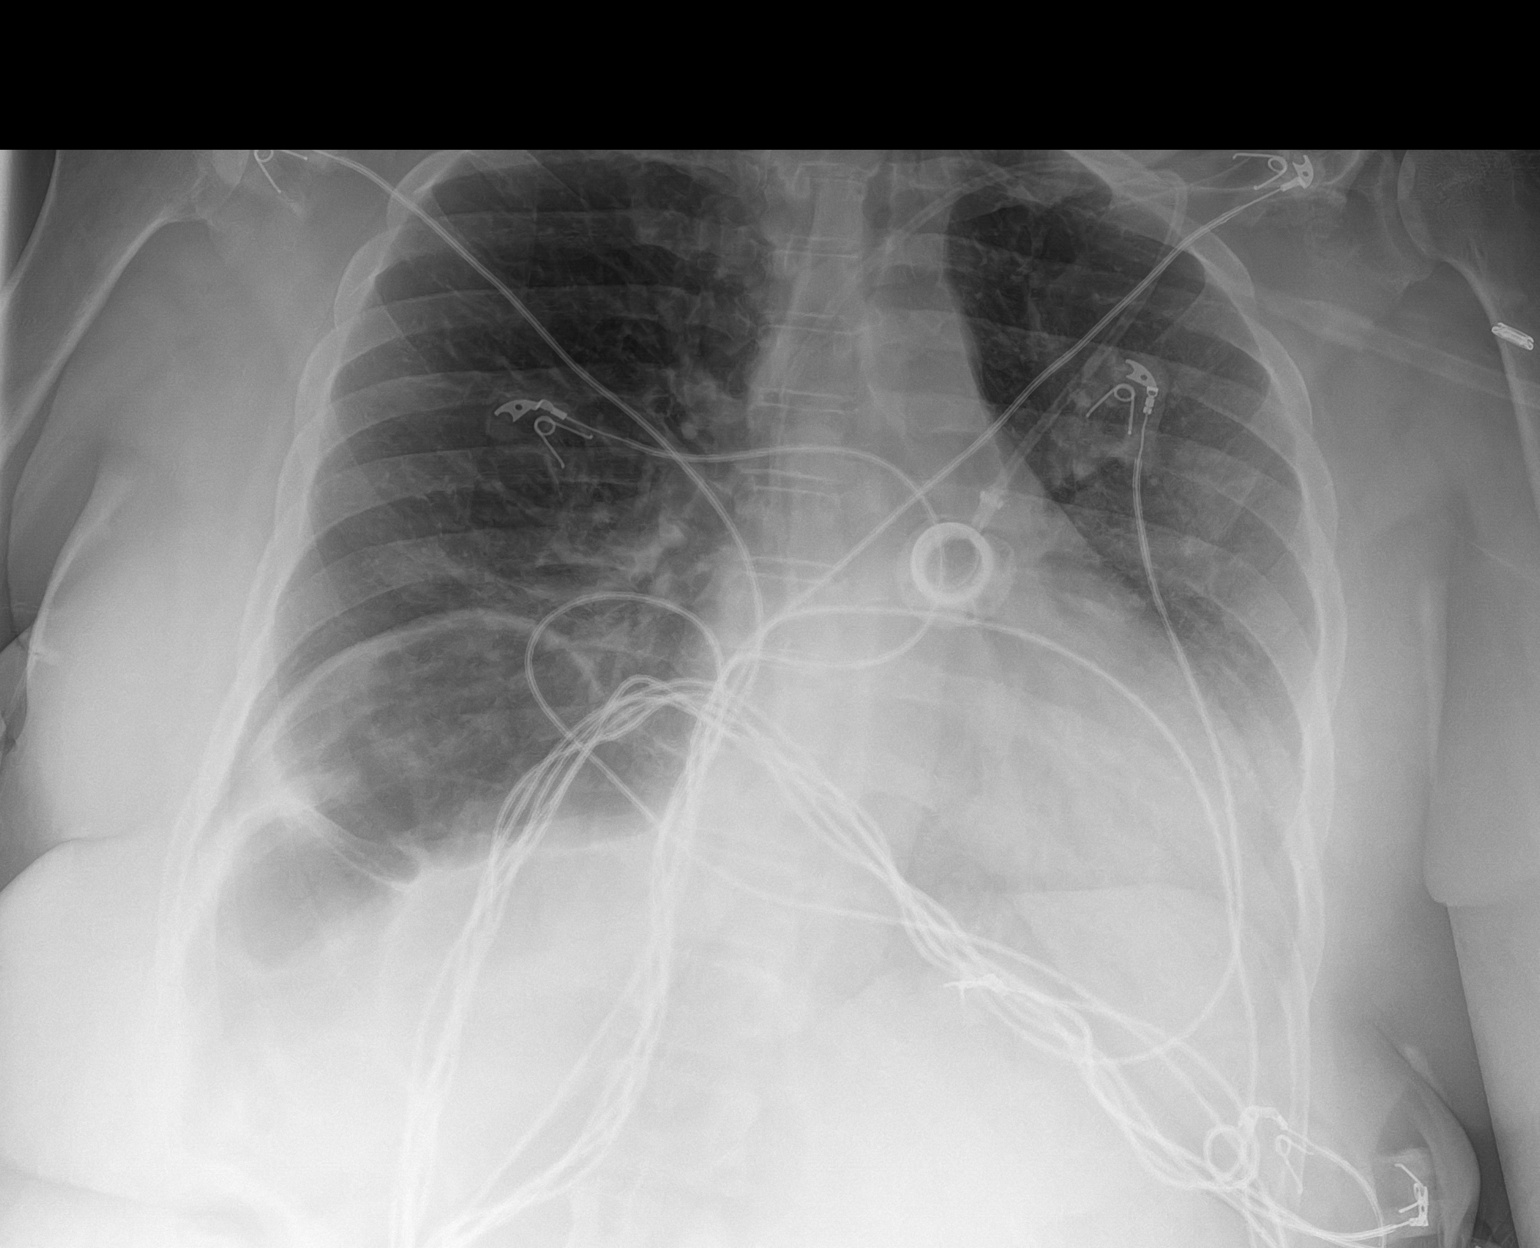

[1 of 1 positions shown; findings below may reference images not displayed]

FINDINGS: Port-A-Cath with tip in similar position at the cavoatrial junction.
Left mid to lower lung field hazy airspace opacities as well as
minimal right lung base densities may represent developing
infiltrate. Clinical correlation is recommended. No large pleural
effusion. There is no pneumothorax. Stable cardiac silhouette. No
acute osseous pathology.
IMPRESSION: Findings may represent developing infiltrate in the left mid to
lower lung field and right lung base.

## 2021-11-30 IMAGING — US US ABDOMEN LIMITED
1 series · 14 of 25 positions shown · non-contrast
Comparison: 06/16/2007.

CLINICAL DATA: Nausea vomiting.  Abnormal LFTs.

EXAM:
ULTRASOUND ABDOMEN LIMITED RIGHT UPPER QUADRANT

[Series 1: us abdomen limited ruq · 14 of 52 slices shown]
[im 1/52]
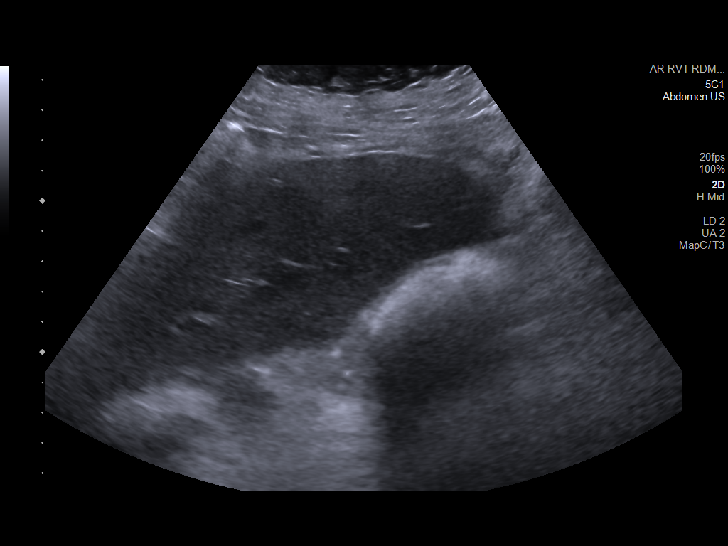
[im 5/52]
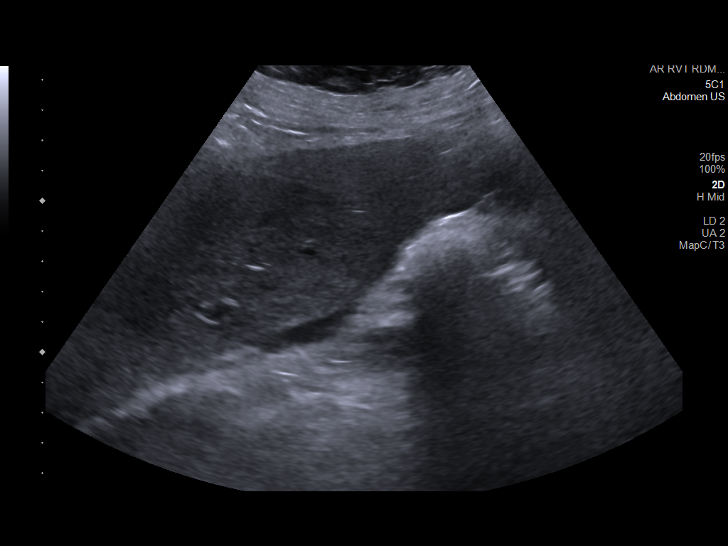
[im 9/52]
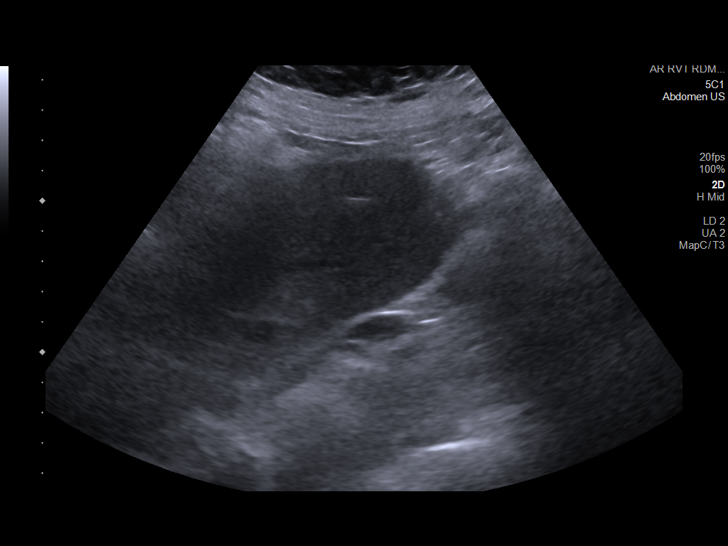
[im 13/52]
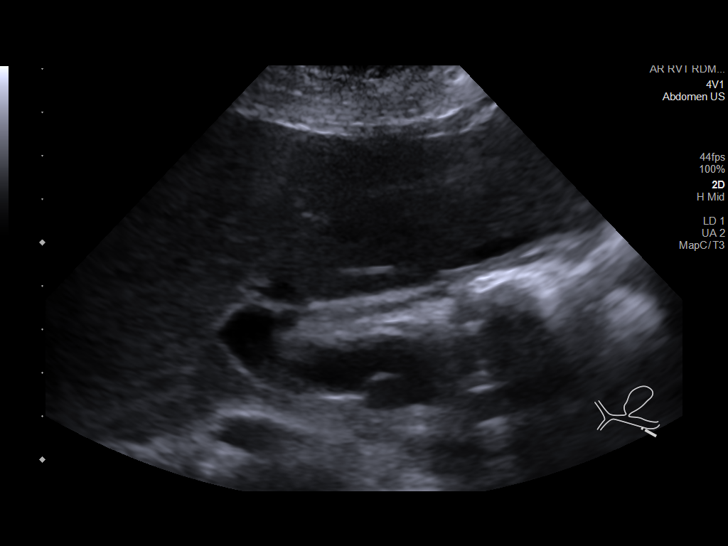
[im 18/52]
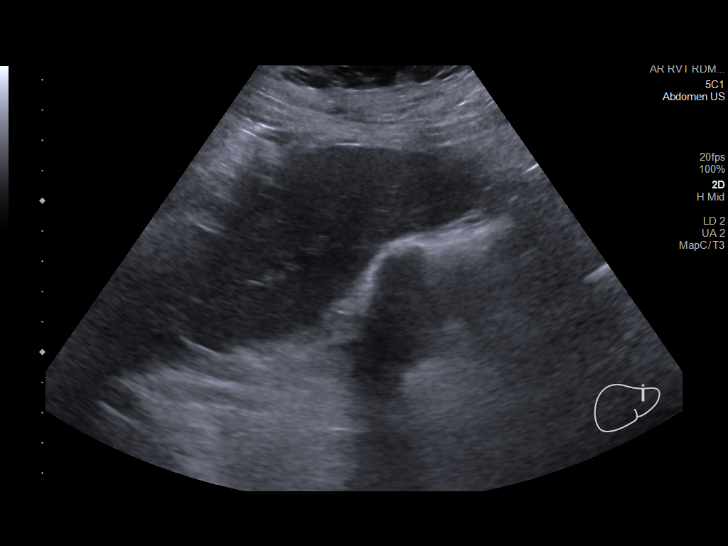
[im 20/52]
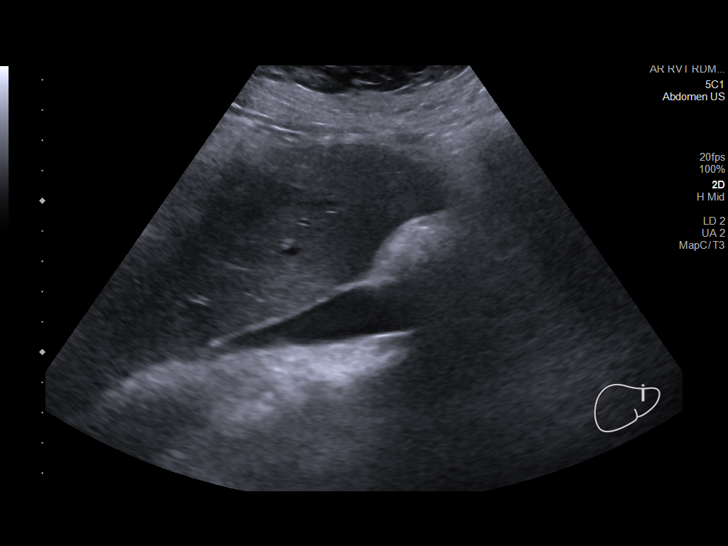
[im 24/52]
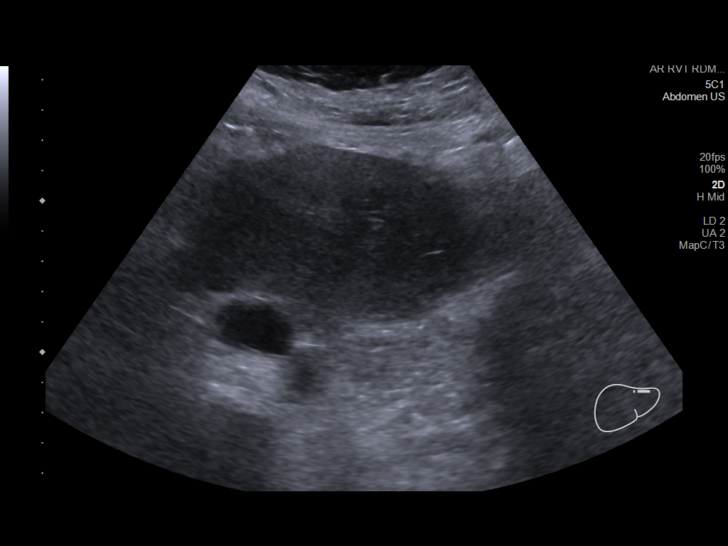
[im 28/52]
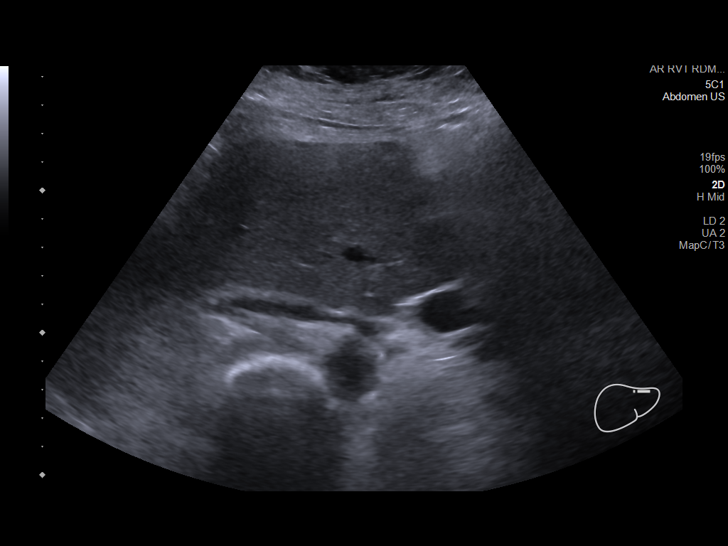
[im 32/52]
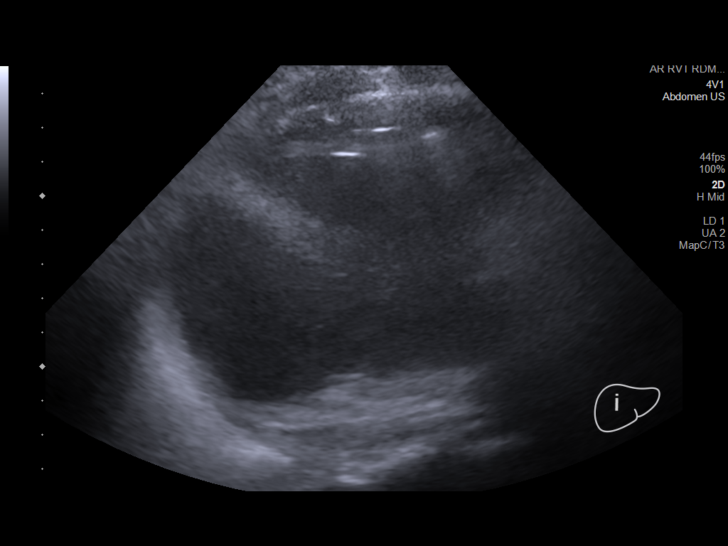
[im 35/52]
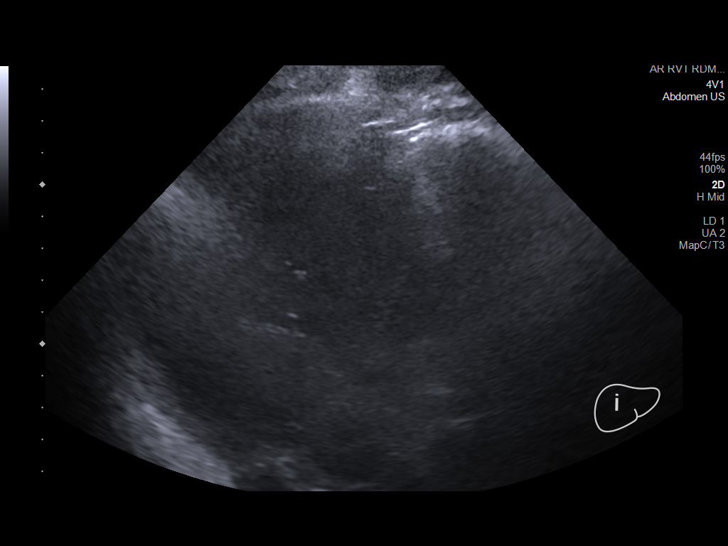
[im 39/52]
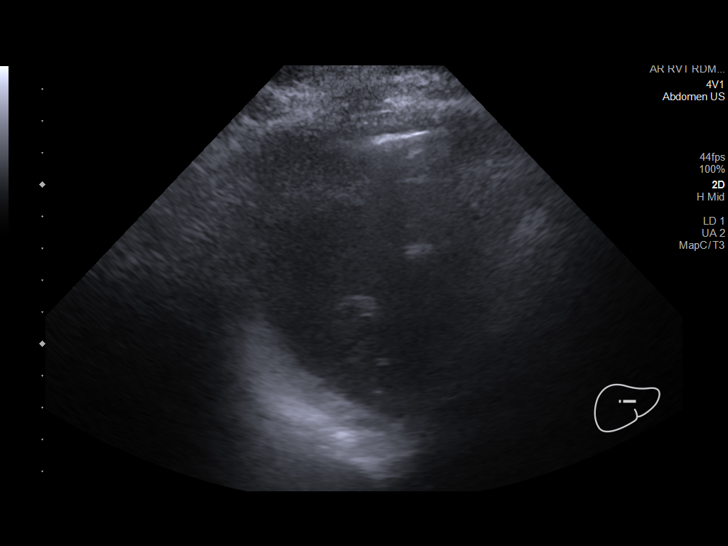
[im 43/52]
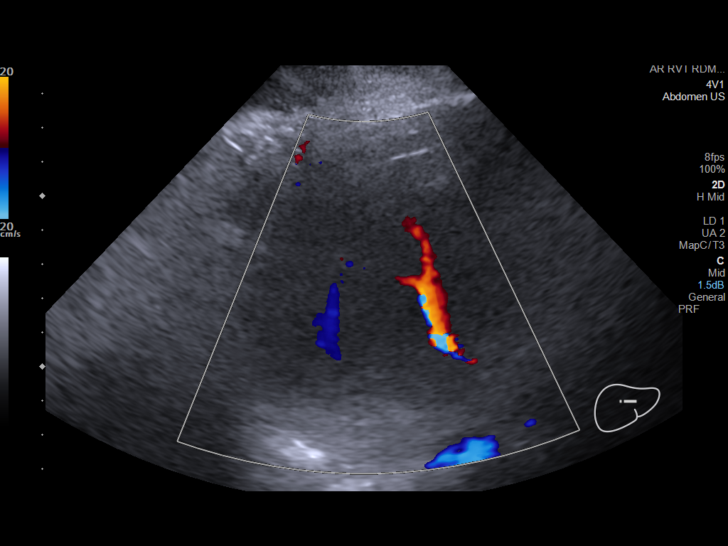
[im 47/52]
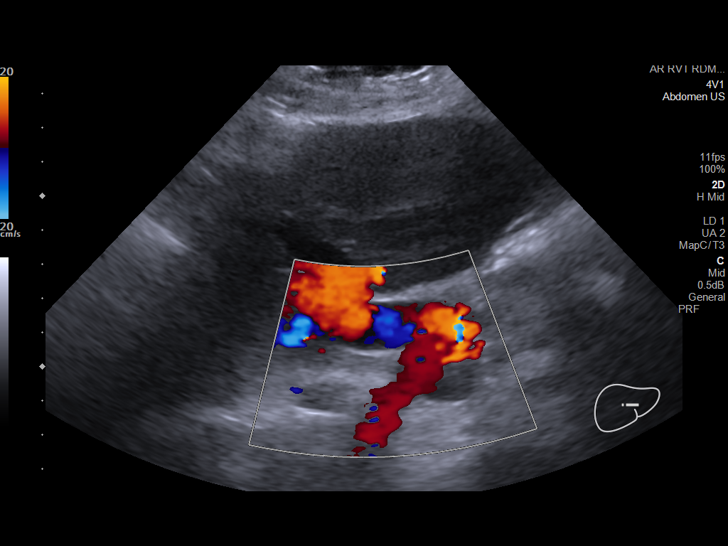
[im 52/52]
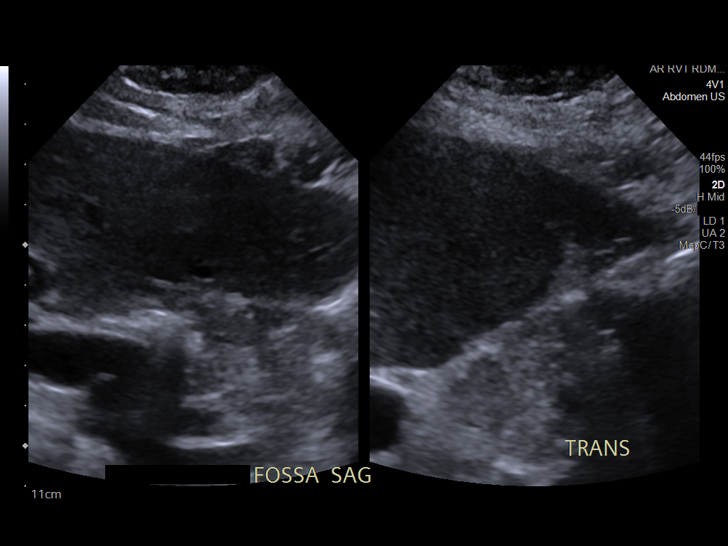

[14 of 25 positions shown; findings below may reference images not displayed]

FINDINGS: Gallbladder:

Cholecystectomy.

Common bile duct:

Diameter: 3 mm

Liver:

Increased echogenicity consistent fatty infiltration or
hepatocellular disease. No focal hepatic abnormality identified.
Portal vein is patent on color Doppler imaging with normal direction
of blood flow towards the liver.

Other: None.
IMPRESSION: 1.  Prior cholecystectomy.  No biliary distention.

2. Increased hepatic echogenicity consistent fatty infiltration or
hepatocellular disease. No focal hepatic abnormality identified.

## 2021-12-01 IMAGING — CT CT CHEST W/O CM
2 of 4 series · 15 of 36 positions shown, 18 images · non-contrast
Comparison: Earlier CT angio chest 02/05/2020

CLINICAL DATA: Pneumomediastinum on preceding CT angio chest,
repeat CT imaging following water-soluble esophagram

EXAM:
CT CHEST WITHOUT CONTRAST
TECHNIQUE: Multidetector CT imaging of the chest was performed following the
standard protocol without IV contrast. Sagittal and coronal MPR
images reconstructed from axial data set.

[Series 2: routine chest without · axial · non-contrast · 0.79mm/px · z∈[-546,-280]mm · 12 of 159 slices shown, 15 images]
[im 13/159  mediastinal]
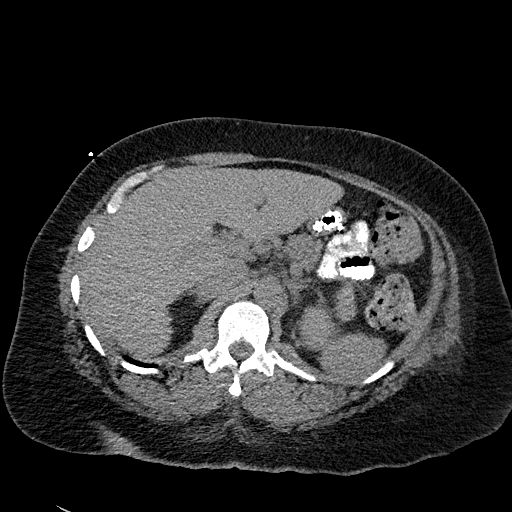
[im 13/159  lung]
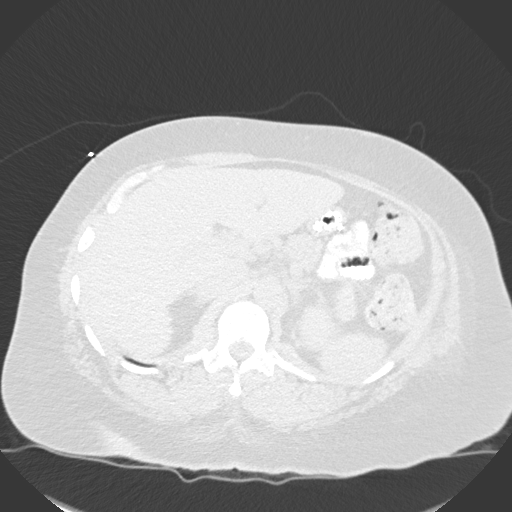
[im 25/159  lung]
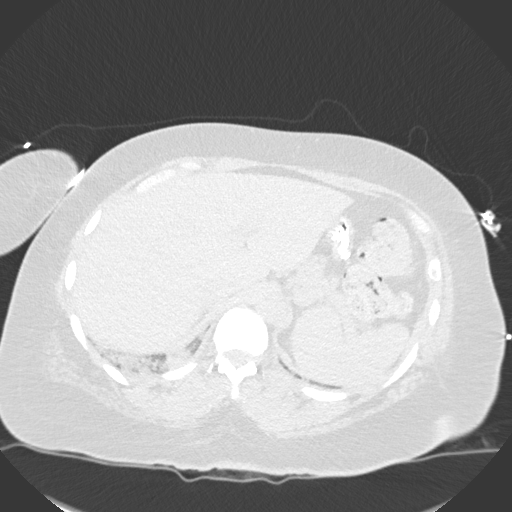
[im 37/159  lung]
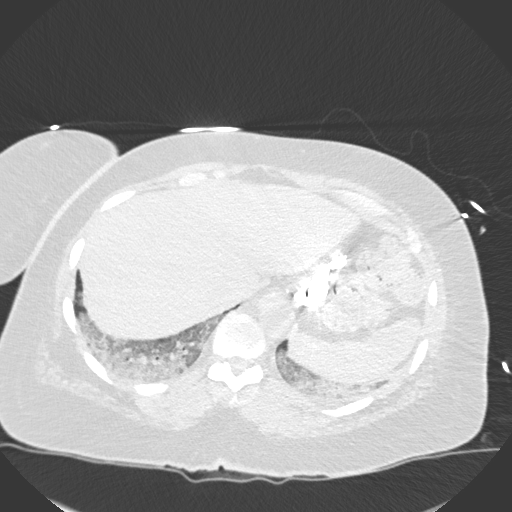
[im 49/159  lung]
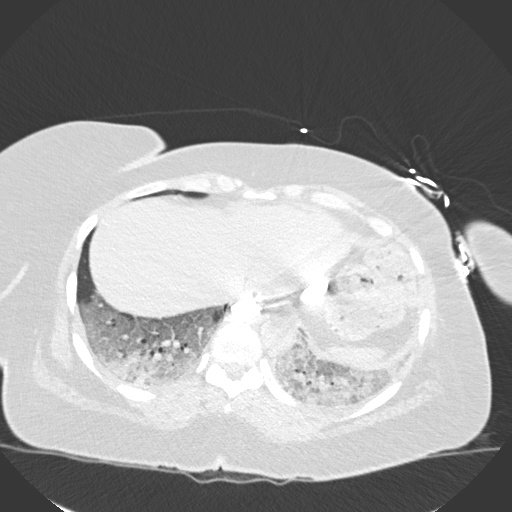
[im 61/159  mediastinal]
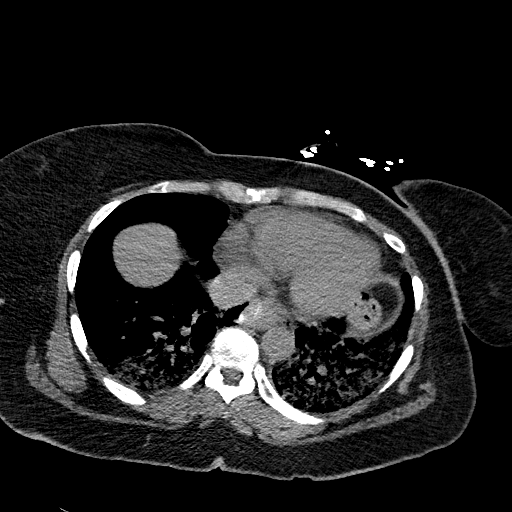
[im 61/159  lung]
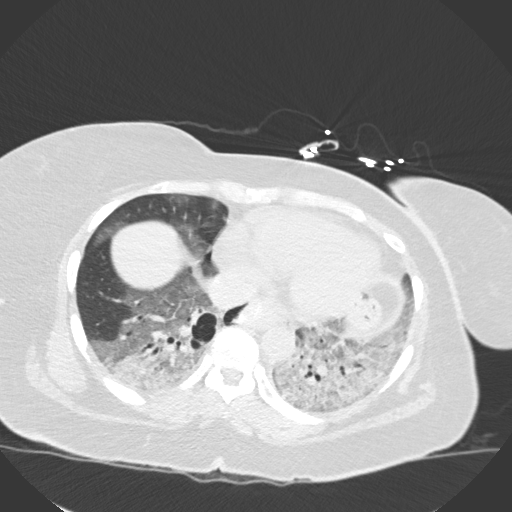
[im 73/159  lung]
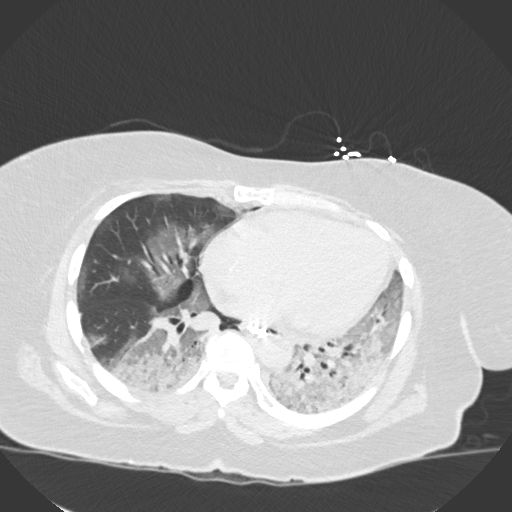
[im 86/159  lung]
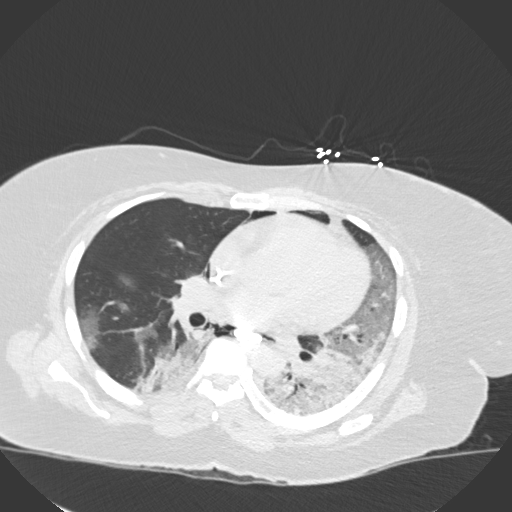
[im 98/159  lung]
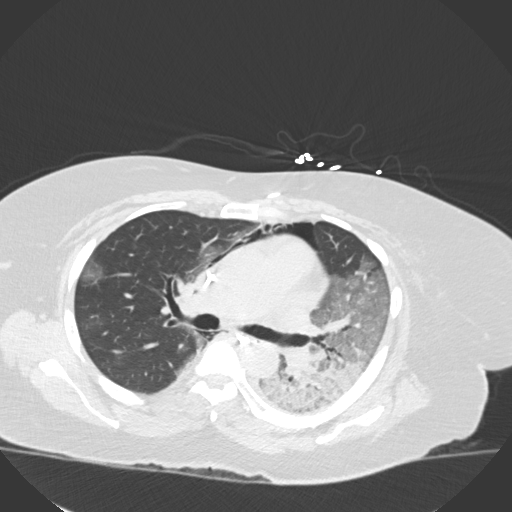
[im 110/159  mediastinal]
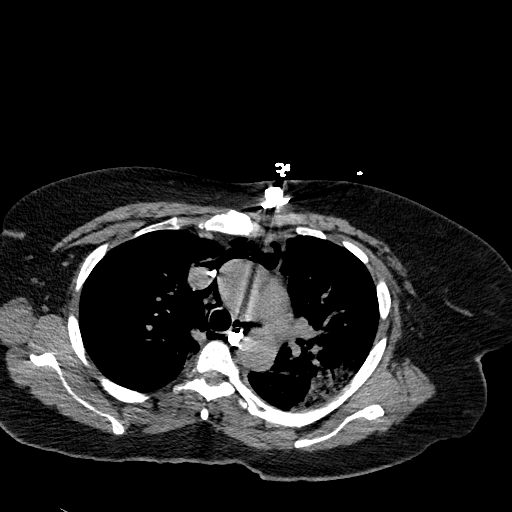
[im 110/159  lung]
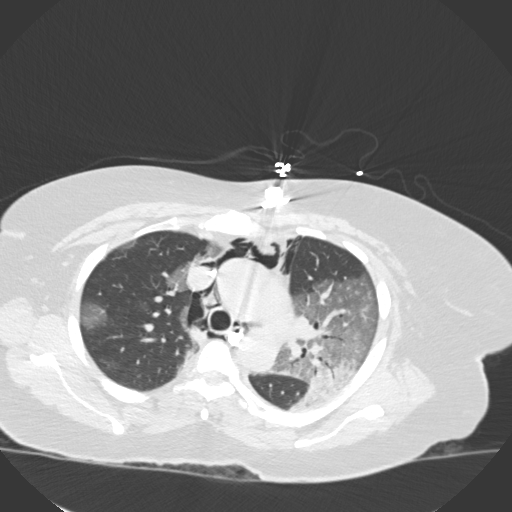
[im 122/159  lung]
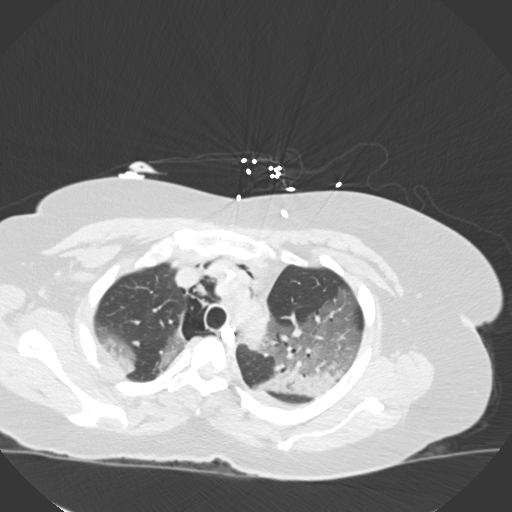
[im 134/159  lung]
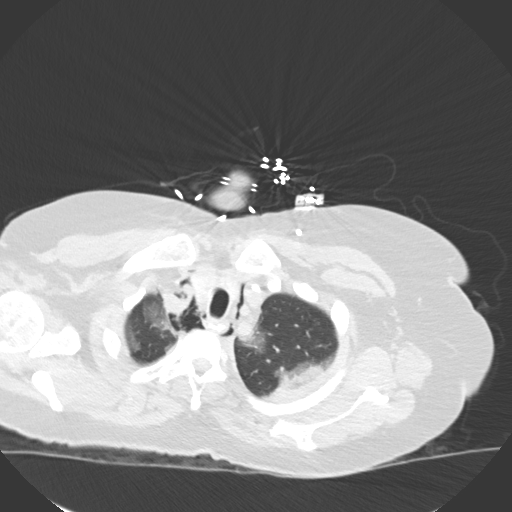
[im 146/159  lung]
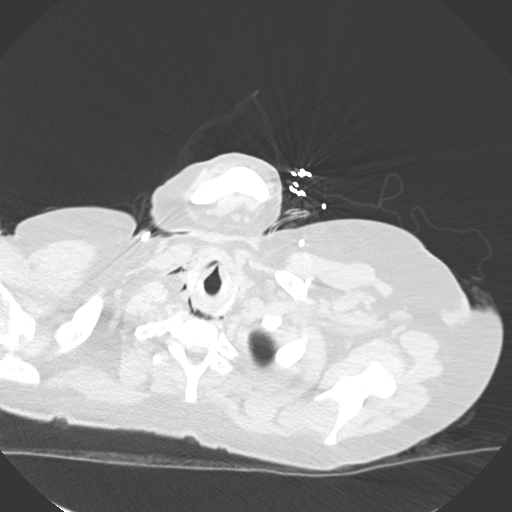

[Series 5: coronal · coronal · 0.70mm/px · 3 of 146 slices shown]
[im 30/146  lung]
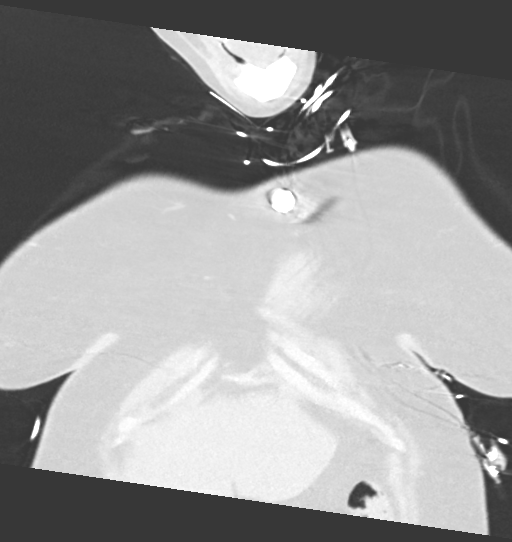
[im 59/146  lung]
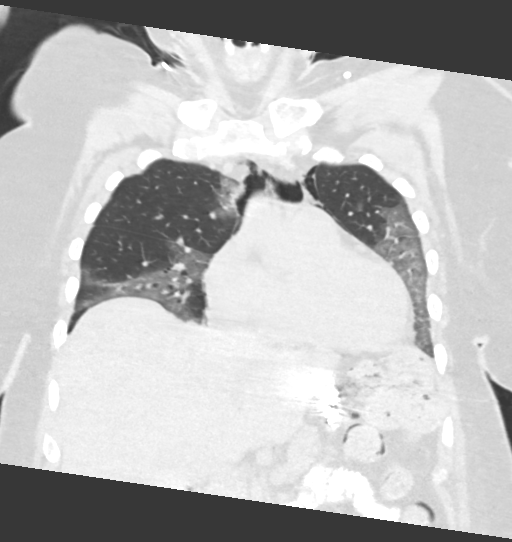
[im 88/146  lung]
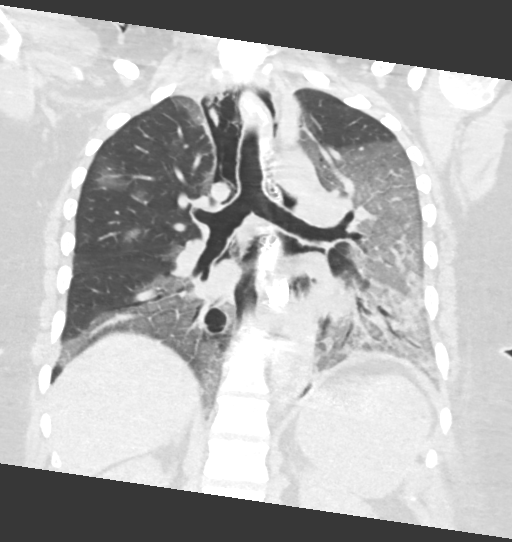

[15 of 36 positions shown; findings below may reference images not displayed]

FINDINGS: Cardiovascular: Aorta normal caliber. LEFT subclavian Port-A-Cath,
tip SVC. No pericardial effusion. Minimal enlargement of cardiac
chambers.

Mediastinum/Nodes: Again identified pneumomediastinum extending into
base of cervical region. Esophagus contains a small amount of
refluxed contrast from the known hiatal hernia. No contrast
extravasation identified.

Lungs/Pleura: Extensive patchy BILATERAL airspace infiltrates
consistent with multifocal pneumonia. No pleural effusion or
pneumothorax. Large bleb medial RIGHT lower lobe.

Upper Abdomen: Prior gastric sleeve resection. Remaining visualized
upper abdomen unremarkable.

Musculoskeletal: No acute osseous findings.
IMPRESSION: Again identified pneumomediastinum extending into base of cervical
region.

No contrast extravasation from the esophagus identified.

Hiatal hernia.

Extensive BILATERAL airspace infiltrates consistent with multifocal
pneumonia.

Prior gastric sleeve resection.

## 2021-12-03 ENCOUNTER — Encounter: Payer: Self-pay | Admitting: Family Medicine

## 2021-12-03 DIAGNOSIS — G35 Multiple sclerosis: Secondary | ICD-10-CM

## 2021-12-03 DIAGNOSIS — M6281 Muscle weakness (generalized): Secondary | ICD-10-CM

## 2021-12-03 DIAGNOSIS — M791 Myalgia, unspecified site: Secondary | ICD-10-CM

## 2021-12-03 DIAGNOSIS — R270 Ataxia, unspecified: Secondary | ICD-10-CM

## 2021-12-04 NOTE — Telephone Encounter (Signed)
Nurses-please order physical therapy for MS, muscle weakness, ataxia, muscle pain Please work with the patient setting up the referral thank you

## 2021-12-26 ENCOUNTER — Telehealth: Payer: Self-pay | Admitting: *Deleted

## 2021-12-26 MED ORDER — PEG-KCL-NACL-NASULF-NA ASC-C 100 G PO SOLR: 1.0000 | Freq: Once | ORAL | 0 refills | Status: DC

## 2021-12-26 MED ORDER — CLENPIQ 10-3.5-12 MG-GM -GM/175ML PO SOLN: 1.0000 | ORAL | 0 refills | Status: DC

## 2021-12-26 NOTE — Telephone Encounter (Signed)
Received fax from pharmacy moviprep is not covered. Patient requested clenpiq. Rx sent and new instructions mailed.

## 2021-12-26 NOTE — Telephone Encounter (Signed)
Patient called in. Needed to reschedule procedure for Friday 7/21. She has been moved to 8/7 at 1pm. Aware will mail new instructions. She did not pick prep up yet.

## 2021-12-26 NOTE — Addendum Note (Signed)
Addended by: Armstead Peaks on: 12/26/2021 10:34 AM   Modules accepted: Orders

## 2021-12-27 NOTE — Therapy (Signed)
OUTPATIENT PHYSICAL THERAPY LUMBAR EVALUATION   Patient Name: Melinda Moore MRN: [id number] DOB:Jul 12, 1970, 51 y.o., female Today's Date: 12/28/2021   PCP: Lilyan Punt, MD REFERRING PROVIDER: Lilyan Punt, MD   PT End of Session - 12/28/21 1515     Visit Number 1    Number of Visits 8    Date for PT Re-Evaluation 01/25/22    Authorization Type [employer] no ded, no VL, no auth required    PT Start Time 1515    PT Stop Time 1550    PT Time Calculation (min) 35 min             Past Medical History:  Diagnosis Date   Anemia    Anxiety    Bell's palsy    Depression    DVT (deep venous thrombosis) (HCC) 09/02/2012   [place] on 02/13/2012 showed acute DVT in left calf veins and posterior tibial veins   Fibromyalgia    GERD (gastroesophageal reflux disease)    History of blood transfusion 2000   History of trigeminal neuralgia    HTN (hypertension)    patient denies was taking a blood pressure medication in early 2000 but it was migraines   Leukopenia    MS (multiple sclerosis) (HCC) 1997   Neuropathic pain    Peripheral edema    Port catheter in place 12/31/2012   Right bundle branch block    Past Surgical History:  Procedure Laterality Date   ABDOMINAL HYSTERECTOMY     CHOLECYSTECTOMY     COLONOSCOPY WITH PROPOFOL N/A 09/27/2021   Procedure: COLONOSCOPY WITH PROPOFOL;  Surgeon: Corbin Ade, MD;  Location: [facility];  Service: Endoscopy;  Laterality: N/A;  8:00am, asa 2   ESOPHAGOGASTRODUODENOSCOPY  2011   Dr. Jena Gauss. 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 Biopsy from the stomach revealed minimal chronic inactive inflammation but negative for H. pylori   LAPAROSCOPIC GASTRIC SLEEVE RESECTION N/A 04/17/2016   Procedure: LAPAROSCOPIC GASTRIC SLEEVE RESECTION WITH UPPER ENDOSCOPY;  Surgeon: Glenna Fellows, MD;  Location: [facility];  Service: General;  Laterality: N/A;    PATELLA-FEMORAL ARTHROPLASTY Right 07/14/2019   Procedure: PATELLA-FEMORAL ARTHROPLASTY;  Surgeon: Teryl Lucy, MD;  Location: [facility];  Service: Orthopedics;  Laterality: Right;   PORTACATH PLACEMENT     TUBAL LIGATION  2001   Patient Active Problem List   Diagnosis Date Noted   Gastroesophageal reflux disease 09/07/2021   Colon cancer screening 09/07/2021   Hx of hysterectomy 06/14/2021   Pneumonia due to COVID-19 virus 02/04/2020   Elevated transaminase level 02/04/2020   Patellofemoral arthritis of right knee 07/14/2019   History of DVT in adulthood 06/23/2019   Right leg pain 03/12/2017   Gait abnormality 03/12/2017   Radiculopathy due to lumbar intervertebral disc disorder 09/21/2016   Spondylosis without myelopathy or radiculopathy, lumbar region 09/21/2016   History of anxiety 09/08/2016   Primary osteoarthritis of right knee 08/30/2016   Primary insomnia 08/30/2016   Myofascial pain 08/30/2016   Sciatica, right side 12/12/2015   Right knee pain 12/12/2015   Morbid obesity (HCC) 08/12/2015   Lumbago 02/10/2014   Chronic anxiety 07/08/2013   Swelling of breast 04/08/2013   Chronic pain syndrome 03/11/2013   Port catheter in place 12/31/2012   Constipation 10/01/2012   Anemia 10/01/2012   Rectal bleeding 10/01/2012   Elevated alkaline phosphatase level 10/01/2012   MS (multiple sclerosis) (HCC) 09/02/2012   Anxiety 11/29/2011   Headache(784.0)  11/29/2011   ANEMIA 04/12/2010   OTHER DYSPHAGIA 04/12/2010    ONSET DATE: MS 1997  REFERRING DIAG: G35 (ICD-10-CM) - MS (multiple sclerosis) (HCC) M62.81 (ICD-10-CM) - Muscle weakness R27.0 (ICD-10-CM) - Ataxia M79.10 (ICD-10-CM) - Muscle pain   THERAPY DIAG:  Low back pain, unspecified back pain laterality, unspecified chronicity, unspecified whether sciatica present  Muscle weakness (generalized)  Right knee pain, unspecified chronicity  Stiffness of right knee, not elsewhere classified  Difficulty in walking,  not elsewhere classified  Multiple sclerosis (HCC)  Rationale for Evaluation and Treatment Rehabilitation  SUBJECTIVE:                                                                                                                                                                                              SUBJECTIVE STATEMENT: Diagnosis with MS years ago; sees neurologist in [facility] ; has generally done well but in 2017 started having more issues; after TKA and COVD " I just haven't bounced back"; reports stiffness, back pain and leg pain; MS has affected Right side more.  Pt accompanied by: self  PERTINENT HISTORY:   LBP;  TKA 2021 R ; then COVD shortly at the end of 2021  x 2 week in the hospital Also had some injections in the back per Dr. Murray Hodgkins that did help some  PAIN:  Are you having pain? Yes: NPRS scale: 7/10 Pain location: Right leg Pain description: constant throbbing Aggravating factors: lots of activity Relieving factors: medication  PRECAUTIONS: Fall  WEIGHT BEARING RESTRICTIONS No  FALLS: Has patient fallen in last 6 months? No  LIVING ENVIRONMENT: Lives with: lives with their family Lives in: House/apartment Stairs: Yes: External: 3 steps; none Has following equipment at home: Single point cane, Walker - 2 wheeled, shower chair, bed side commode, and Grab bars  PLOF: Independent  PATIENT GOALS  Get off of some of these pain meds  OBJECTIVE:   DIAGNOSTIC FINDINGS: none of lumbar spine  COGNITION: Overall cognitive status: Within functional limits for tasks assessed   SENSATION: feet from MS   POSTURE: flexed trunk        LUMBAR ROM:   Active  A/PROM  eval  Flexion Fingers to mid shin  Extension -10 degrees  Right lateral flexion To just below  Left lateral flexion To above knee  Right rotation Limited 70%  Left rotation Limted 70%    LOWER EXTREMITY MMT:    MMT Right Eval Left Eval  Hip flexion 4 5  Hip extension 3- 3-   Hip abduction    Hip adduction    Hip internal rotation  Hip external rotation    Knee flexion    Knee extension 4+ 5  Ankle dorsiflexion 3+ 5  Ankle plantarflexion    Ankle inversion    Ankle eversion    (Blank rows = not tested)  BED MOBILITY:  Sit to supine Modified independence Supine to sit Modified independence Rolling to Right Modified independence Rolling to Left Modified independence   GAIT: Gait pattern: antalgic, decreased trunk rotation, trunk flexed, poor foot clearance- Right, and poor foot clearance- Left Distance walked: 223 Assistive device utilized: None Level of assistance: Modified independence and SBA Comments: fatigued at the end of walk has to sit before 2 min are up  FUNCTIONAL TESTs:  5 times sit to stand: 24 sec without arms 2 minute walk test: 223 ft  PATIENT SURVEYS:  FOTO 58  TODAY'S TREATMENT:  Physical therapy evaluation, HEP instruction  PATIENT EDUCATION:  Education details: Patient educated on exam findings, POC, scope of PT, HEP Person educated: Patient Education method: Consulting civil engineer, Demonstration, and Handouts Education comprehension: verbalized understanding, returned demonstration, verbal cues required, and tactile cues required    HOME EXERCISE PROGRAM: Access Code: [email or web address] URL: [email or web address] Date: 12/28/2021 Prepared by: [facility]  Exercises - Static Prone on Elbows  - 2 x daily - 7 x weekly - 1 sets - 1 reps - 3 min hold - Prone Press Up  - 2 x daily - 7 x weekly - 1 sets - 5 reps - Prone Gluteal Sets  - 1 x daily - 7 x weekly - 1 sets - 10 reps - 5" hold - Supine Bridge  - 1 x daily - 7 x weekly - 3 sets - 10 reps    GOALS: Goals reviewed with patient? No  SHORT TERM GOALS: Target date: 01/11/2022   patient will be independent with initial HEP  Baseline: Goal status: INITIAL  2.  Patient will demonstrate improved lumbar extension to neutral to improve endurance with standing,  walking Baseline:  Goal status: INITIAL    LONG TERM GOALS: Target date: 01/25/2022  Patient will be independent with advanced HEP and self management strategies to improve quality of life and functional outcomes Baseline:  Goal status: INITIAL  2.  Patient will improve 5 x STS score from 24 sec to 18 sec to demonstrate improved functional mobility and increased lower extremity strength.  Baseline:  Goal status: INITIAL  3.  Patient will report at least 50% improvement in overall symptoms and/or function to demonstrate improved functional mobility Baseline:  Goal status: INITIAL  4.  Patient will improve FOTO score to predicted value to demonstrate improved perceived functional mobility Baseline: 58 Goal status: INITIAL  5.  Patient will increase distance on 2MWT to 280 ft to demonstrate improved functional gait for community ambulation  Baseline: 223 ft Goal status: INITIAL  ASSESSMENT:  CLINICAL IMPRESSION: Patient is a 51 y.o. female who was seen today for physical therapy evaluation and treatment for low back pain; hx of MS.  G35 (ICD-10-CM) - MS (multiple sclerosis) (HCC) M62.81 (ICD-10-CM) - Muscle weakness R27.0 (ICD-10-CM) - Ataxia M79.10 (ICD-10-CM) - Muscle pain. She presents with pain, decreased lumbar mobility, decreased core and lower extremity strength that limit her ability to navigate steps; walk for more than household distance or stand for tasks like cooking and cleaning and limit her ability to care for her grandkids. Patient will benefit from skilled therapy services to address deficits and promote optimal function.   OBJECTIVE IMPAIRMENTS Abnormal gait, decreased activity tolerance, decreased balance,  decreased endurance, decreased knowledge of condition, decreased knowledge of use of DME, decreased mobility, difficulty walking, decreased ROM, decreased strength, decreased safety awareness, hypomobility, increased fascial restrictions, impaired perceived  functional ability, impaired flexibility, and pain.   ACTIVITY LIMITATIONS carrying, lifting, bending, sitting, standing, squatting, sleeping, stairs, reach over head, locomotion level, and caring for others  PARTICIPATION LIMITATIONS: meal prep, cleaning, laundry, shopping, and community activity  PERSONAL FACTORS 1 comorbidity: MS  are also affecting patient's functional outcome.   REHAB POTENTIAL: Good  CLINICAL DECISION MAKING: Evolving/moderate complexity  EVALUATION COMPLEXITY: Moderate  PLAN: PT FREQUENCY: 2x/week  PT DURATION: 4 weeks  PLANNED INTERVENTIONS: Therapeutic exercises, Therapeutic activity, Neuromuscular re-education, Balance training, Gait training, Patient/Family education, Joint manipulation, Joint mobilization, Stair training, Orthotic/Fit training, DME instructions, Aquatic Therapy, Dry Needling, Electrical stimulation, Spinal manipulation, Spinal mobilization, Cryotherapy, Moist heat, Compression bandaging, scar mobilization, Splintting, Taping, Traction, Ultrasound, Ionotophoresis 4mg /ml Dexamethasone, and Manual therapy   PLAN FOR NEXT SESSION: Review HEP and goals, progress general mobility and strengthening as able.   4:33 PM, 12/28/21 Alonah Lineback Small Mairen Wallenstein MPT [facility] [place] [phone]

## 2021-12-28 ENCOUNTER — Encounter: Payer: Self-pay | Admitting: Internal Medicine

## 2021-12-28 ENCOUNTER — Ambulatory Visit (HOSPITAL_COMMUNITY): Payer: 59 | Attending: Family Medicine

## 2021-12-28 DIAGNOSIS — M6281 Muscle weakness (generalized): Secondary | ICD-10-CM | POA: Insufficient documentation

## 2021-12-28 DIAGNOSIS — G35 Multiple sclerosis: Secondary | ICD-10-CM | POA: Diagnosis not present

## 2021-12-28 DIAGNOSIS — R262 Difficulty in walking, not elsewhere classified: Secondary | ICD-10-CM | POA: Diagnosis not present

## 2021-12-28 DIAGNOSIS — M545 Low back pain, unspecified: Secondary | ICD-10-CM | POA: Diagnosis not present

## 2021-12-28 DIAGNOSIS — M25561 Pain in right knee: Secondary | ICD-10-CM | POA: Diagnosis not present

## 2021-12-28 DIAGNOSIS — R270 Ataxia, unspecified: Secondary | ICD-10-CM | POA: Insufficient documentation

## 2021-12-28 DIAGNOSIS — R2689 Other abnormalities of gait and mobility: Secondary | ICD-10-CM | POA: Diagnosis not present

## 2021-12-28 DIAGNOSIS — M25661 Stiffness of right knee, not elsewhere classified: Secondary | ICD-10-CM | POA: Diagnosis not present

## 2021-12-28 DIAGNOSIS — M791 Myalgia, unspecified site: Secondary | ICD-10-CM | POA: Diagnosis not present

## 2022-01-01 ENCOUNTER — Encounter (HOSPITAL_COMMUNITY): Payer: 59

## 2022-01-05 ENCOUNTER — Encounter (HOSPITAL_COMMUNITY): Payer: 59 | Admitting: Physical Therapy

## 2022-01-08 ENCOUNTER — Ambulatory Visit (HOSPITAL_COMMUNITY): Payer: 59

## 2022-01-08 DIAGNOSIS — M6281 Muscle weakness (generalized): Secondary | ICD-10-CM

## 2022-01-08 DIAGNOSIS — M545 Low back pain, unspecified: Secondary | ICD-10-CM

## 2022-01-08 DIAGNOSIS — R262 Difficulty in walking, not elsewhere classified: Secondary | ICD-10-CM

## 2022-01-08 DIAGNOSIS — R270 Ataxia, unspecified: Secondary | ICD-10-CM | POA: Diagnosis not present

## 2022-01-08 DIAGNOSIS — R2689 Other abnormalities of gait and mobility: Secondary | ICD-10-CM

## 2022-01-08 DIAGNOSIS — G35 Multiple sclerosis: Secondary | ICD-10-CM | POA: Diagnosis not present

## 2022-01-08 DIAGNOSIS — M25561 Pain in right knee: Secondary | ICD-10-CM | POA: Diagnosis not present

## 2022-01-08 DIAGNOSIS — M25661 Stiffness of right knee, not elsewhere classified: Secondary | ICD-10-CM | POA: Diagnosis not present

## 2022-01-08 DIAGNOSIS — M791 Myalgia, unspecified site: Secondary | ICD-10-CM | POA: Diagnosis not present

## 2022-01-08 NOTE — Therapy (Addendum)
OUTPATIENT PHYSICAL THERAPY LUMBAR TREATMENT   Patient Name: Melinda Moore MRN: [id number] DOB:25-Jan-1971, 51 y.o., female Today's Date: 01/08/2022   PCP: Sallee Lange, MD REFERRING PROVIDER: Sallee Lange, MD   PT End of Session - 01/08/22 [phone]     Visit Number 2    Number of Visits 8    Date for PT Re-Evaluation 01/25/22    Authorization Type UHC no ded, no VL, no auth required    PT Start Time 0938    PT Stop Time 1020    PT Time Calculation (min) 42 min              Past Medical History:  Diagnosis Date   Anemia    Anxiety    Bell's palsy    Depression    DVT (deep venous thrombosis) ([place]) 09/02/2012   [place] on 02/13/2012 showed acute DVT in left calf veins and posterior tibial veins   Fibromyalgia    GERD (gastroesophageal reflux disease)    History of blood transfusion 2000   History of trigeminal neuralgia    HTN (hypertension)    patient denies was taking a blood pressure medication in early 2000 but it was migraines   Leukopenia    MS (multiple sclerosis) ([place]) 1997   Neuropathic pain    Peripheral edema    Port catheter in place 12/31/2012   Right bundle branch block    Past Surgical History:  Procedure Laterality Date   ABDOMINAL HYSTERECTOMY     CHOLECYSTECTOMY     COLONOSCOPY WITH PROPOFOL N/A 09/27/2021   Procedure: COLONOSCOPY WITH PROPOFOL;  Surgeon: Daneil Dolin, MD;  [facility];  Service: Endoscopy;  Laterality: N/A;  8:00am, asa 2   ESOPHAGOGASTRODUODENOSCOPY  2011   Dr. Gala Romney. 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 Biopsy from the stomach revealed minimal chronic inactive inflammation but negative for H. pylori   LAPAROSCOPIC GASTRIC SLEEVE RESECTION N/A 04/17/2016   Procedure: LAPAROSCOPIC GASTRIC SLEEVE RESECTION WITH UPPER ENDOSCOPY;  Surgeon: Excell Seltzer, MD;  Location: [facility];  Service: General;  Laterality: N/A;    PATELLA-FEMORAL ARTHROPLASTY Right 07/14/2019   Procedure: PATELLA-FEMORAL ARTHROPLASTY;  Surgeon: Marchia Bond, MD;  Location: [facility];  Service: Orthopedics;  Laterality: Right;   PORTACATH PLACEMENT     TUBAL LIGATION  2001   Patient Active Problem List   Diagnosis Date Noted   Gastroesophageal reflux disease 09/07/2021   Colon cancer screening 09/07/2021   Hx of hysterectomy 06/14/2021   Pneumonia due to COVID-19 virus 02/04/2020   Elevated transaminase level 02/04/2020   Patellofemoral arthritis of right knee 07/14/2019   History of DVT in adulthood 06/23/2019   Right leg pain 03/12/2017   Gait abnormality 03/12/2017   Radiculopathy due to lumbar intervertebral disc disorder 09/21/2016   Spondylosis without myelopathy or radiculopathy, lumbar region 09/21/2016   History of anxiety 09/08/2016   Primary osteoarthritis of right knee 08/30/2016   Primary insomnia 08/30/2016   Myofascial pain 08/30/2016   Sciatica, right side 12/12/2015   Right knee pain 12/12/2015   Morbid obesity ([place]) 08/12/2015   Lumbago 02/10/2014   Chronic anxiety 07/08/2013   Swelling of breast 04/08/2013   Chronic pain syndrome 03/11/2013   Port catheter in place 12/31/2012   Constipation 10/01/2012   Anemia 10/01/2012   Rectal bleeding 10/01/2012   Elevated alkaline phosphatase level 10/01/2012   MS (multiple sclerosis) ([place]) 09/02/2012   Anxiety 11/29/2011  Headache(784.0) 11/29/2011   ANEMIA 04/12/2010   OTHER DYSPHAGIA 04/12/2010    ONSET DATE: MS 1997  REFERRING DIAG: G35 (ICD-10-CM) - MS (multiple sclerosis) ([place]) M62.81 (ICD-10-CM) - Muscle weakness R27.0 (ICD-10-CM) - Ataxia M79.10 (ICD-10-CM) - Muscle pain   THERAPY DIAG:  Low back pain, unspecified back pain laterality, unspecified chronicity, unspecified whether sciatica present  Muscle weakness (generalized)  Multiple sclerosis (HCC)  Difficulty in walking, not elsewhere classified  Other abnormalities of gait and  mobility  Rationale for Evaluation and Treatment Rehabilitation  SUBJECTIVE:                                                                                                                                                                                              SUBJECTIVE STATEMENT: States she sees some improvement with exercises.  She has right leg pain today.    PERTINENT HISTORY:   LBP;  TKA 2021 R ; then COVD shortly at the end of 2021  x 2 week in the hospital Also had some injections in the back per Dr. Brien Few that did help some  PAIN:  Are you having pain? Yes: NPRS scale: 8/10 Pain location: Right leg Pain description: constant throbbing Aggravating factors: lots of activity Relieving factors: medication  PRECAUTIONS: Fall  WEIGHT BEARING RESTRICTIONS No  FALLS: Has patient fallen in last 6 months? No  LIVING ENVIRONMENT: Lives with: lives with their family Lives in: House/apartment Stairs: Yes: External: 3 steps; none Has following equipment at home: Single point cane, Walker - 2 wheeled, shower chair, bed side commode, and Grab bars  PLOF: Independent  PATIENT GOALS  Get off of some of these pain meds  OBJECTIVE:   DIAGNOSTIC FINDINGS: none of lumbar spine  COGNITION: Overall cognitive status: Within functional limits for tasks assessed   SENSATION: feet from MS   POSTURE: flexed trunk        LUMBAR ROM:   Active  A/PROM  eval  Flexion Fingers to mid shin  Extension -10 degrees  Right lateral flexion To just below knee  Left lateral flexion To above knee  Right rotation Limited 70%  Left rotation Limted 70%    LOWER EXTREMITY MMT:    MMT Right Eval Left Eval  Hip flexion 4 5  Hip extension 3- 3-  Hip abduction    Hip adduction    Hip internal rotation    Hip external rotation    Knee flexion    Knee extension 4+ 5  Ankle dorsiflexion 3+ 5  Ankle plantarflexion    Ankle inversion    Ankle eversion    (Blank rows =  not  tested)  BED MOBILITY:  Sit to supine Modified independence Supine to sit Modified independence Rolling to Right Modified independence Rolling to Left Modified independence   GAIT: Gait pattern: antalgic, decreased trunk rotation, trunk flexed, poor foot clearance- Right, and poor foot clearance- Left Distance walked: 223 Assistive device utilized: None Level of assistance: Modified independence and SBA Comments: fatigued at the end of walk has to sit before 2 min are up  FUNCTIONAL TESTs:  5 times sit to stand: 24 sec without arms 2 minute walk test: 223 ft  PATIENT SURVEYS:  FOTO 58  TODAY'S TREATMENT:  01/08/22 Review of HEP and goals  Prone: Prone on elbows x 3' Prone press up 2 x 5 Prone glute sets 5" hold x 10  Supine: Bridge x 10 LTR x 10 Abdominal bracing 5" hold x 10 Abdominal bracing with marching x 10 Scapular retraction 5" hold x 10  Standing: RTB  shoulder rows 2 x 10  Standing tall at wall x 30"      PATIENT EDUCATION:  Education details: 7/31 updated HEP; Patient educated on exam findings, POC, scope of PT, HEP Person educated: Patient Education method: Explanation, Demonstration, and Handouts Education comprehension: verbalized understanding, returned demonstration, verbal cues required, and tactile cues required    HOME EXERCISE PROGRAM: Access Code: [email or web address] URL: [email or web address] Date: 01/08/2022 Prepared by: [facility] - Rehab  Exercises - Static Prone on Elbows  - 2 x daily - 7 x weekly - 1 sets - 1 reps - 3 min hold - Prone Press Up  - 2 x daily - 7 x weekly - 1 sets - 5 reps - Prone Gluteal Sets  - 2 x daily - 7 x weekly - 1 sets - 10 reps - 5" hold - Supine Bridge  - 2 x daily - 7 x weekly - 3 sets - 10 reps - Supine Lower Trunk Rotation  - 2 x daily - 7 x weekly - 1 sets - 10 reps - Supine Transversus Abdominis Bracing - Hands on Stomach  - 2 x daily - 7 x weekly - 1 sets - 10 reps - Supine March  - 2 x daily - 7 x  weekly - 1 sets - 10 reps - Supine Scapular Retraction  - 2 x daily - 7 x weekly - 1 sets - 10 reps   GOALS: Goals reviewed with patient? No  SHORT TERM GOALS: Target date: 01/11/2022   patient will be independent with initial HEP  Baseline: Goal status: met  2.  Patient will demonstrate improved lumbar extension to neutral to improve endurance with standing, walking Baseline:  Goal status: ongoing    LONG TERM GOALS: Target date: 01/25/2022  Patient will be independent with advanced HEP and self management strategies to improve quality of life and functional outcomes Baseline:  Goal status:ongoing  2.  Patient will improve 5 x STS score from 24 sec to 18 sec to demonstrate improved functional mobility and increased lower extremity strength.  Baseline:  Goal status: ongoing  3.  Patient will report at least 50% improvement in overall symptoms and/or function to demonstrate improved functional mobility Baseline:  Goal status: ongoing  4.  Patient will improve FOTO score to predicted value to demonstrate improved perceived functional mobility Baseline: 58 Goal status: ongoing  5.  Patient will increase distance on 2MWT to 280 ft to demonstrate improved functional gait for community ambulation  Baseline: 223 ft Goal status: ongoing  ASSESSMENT:  CLINICAL IMPRESSION: Today's session  started with review of HEP and goals. Patient verbalizes agreement with set rehab goals. Patient continues to stand and walk with forward flexed trunk.  Patient limited lumbar extension continues; minimal amount of extension noted with prone press ups.Progressed HEP. Improved standing posture at the end of treatment although still quite forward flexed.  Patient will benefit from continued skilled therapy interventions to address deficits and improve functional mobility.      OBJECTIVE IMPAIRMENTS Abnormal gait, decreased activity tolerance, decreased balance, decreased endurance, decreased  knowledge of condition, decreased knowledge of use of DME, decreased mobility, difficulty walking, decreased ROM, decreased strength, decreased safety awareness, hypomobility, increased fascial restrictions, impaired perceived functional ability, impaired flexibility, and pain.   ACTIVITY LIMITATIONS carrying, lifting, bending, sitting, standing, squatting, sleeping, stairs, reach over head, locomotion level, and caring for others  PARTICIPATION LIMITATIONS: meal prep, cleaning, laundry, shopping, and community activity  PERSONAL FACTORS 1 comorbidity: MS  are also affecting patient's functional outcome.   REHAB POTENTIAL: Good  CLINICAL DECISION MAKING: Evolving/moderate complexity  EVALUATION COMPLEXITY: Moderate  PLAN: PT FREQUENCY: 2x/week  PT DURATION: 4 weeks  PLANNED INTERVENTIONS: Therapeutic exercises, Therapeutic activity, Neuromuscular re-education, Balance training, Gait training, Patient/Family education, Joint manipulation, Joint mobilization, Stair training, Orthotic/Fit training, DME instructions, Aquatic Therapy, Dry Needling, Electrical stimulation, Spinal manipulation, Spinal mobilization, Cryotherapy, Moist heat, Compression bandaging, scar mobilization, Splintting, Taping, Traction, Ultrasound, Ionotophoresis 83m/ml Dexamethasone, and Manual therapy   PLAN FOR NEXT SESSION:  progress general mobility and strengthening as able; focus on Postural strengthening; lumbar extension  10:23 AM, 01/08/22 Lillyonna Armstead Small Rilya Longo MPT [facility] [place] #[phone]:[phone]

## 2022-01-11 ENCOUNTER — Ambulatory Visit (HOSPITAL_COMMUNITY): Payer: 59 | Attending: Family Medicine | Admitting: Physical Therapy

## 2022-01-11 ENCOUNTER — Encounter (HOSPITAL_COMMUNITY): Payer: Self-pay | Admitting: Physical Therapy

## 2022-01-11 DIAGNOSIS — G35 Multiple sclerosis: Secondary | ICD-10-CM | POA: Diagnosis not present

## 2022-01-11 DIAGNOSIS — M6281 Muscle weakness (generalized): Secondary | ICD-10-CM | POA: Insufficient documentation

## 2022-01-11 DIAGNOSIS — R262 Difficulty in walking, not elsewhere classified: Secondary | ICD-10-CM | POA: Insufficient documentation

## 2022-01-11 DIAGNOSIS — M25661 Stiffness of right knee, not elsewhere classified: Secondary | ICD-10-CM | POA: Insufficient documentation

## 2022-01-11 DIAGNOSIS — M545 Low back pain, unspecified: Secondary | ICD-10-CM | POA: Diagnosis not present

## 2022-01-11 DIAGNOSIS — M25561 Pain in right knee: Secondary | ICD-10-CM | POA: Insufficient documentation

## 2022-01-11 DIAGNOSIS — R2689 Other abnormalities of gait and mobility: Secondary | ICD-10-CM | POA: Insufficient documentation

## 2022-01-11 NOTE — Therapy (Signed)
OUTPATIENT PHYSICAL THERAPY LUMBAR TREATMENT   Patient Name: Melinda Moore MRN: [id number] DOB:1970/10/18, 51 y.o., female Today's Date: 01/11/2022   PCP: Sallee Lange, MD REFERRING PROVIDER: Sallee Lange, MD   PT End of Session - 01/11/22 1418     Visit Number 3    Number of Visits 8    Date for PT Re-Evaluation 01/25/22    Authorization Type UHC no ded, no VL, no auth required    PT Start Time 1418    PT Stop Time 1456    PT Time Calculation (min) 38 min    Activity Tolerance Patient tolerated treatment well    Behavior During Therapy WFL for tasks assessed/performed              Past Medical History:  Diagnosis Date   Anemia    Anxiety    Bell's palsy    Depression    DVT (deep venous thrombosis) ([place]) 09/02/2012   [place] on 02/13/2012 showed acute DVT in left calf veins and posterior tibial veins   Fibromyalgia    GERD (gastroesophageal reflux disease)    History of blood transfusion 2000   History of trigeminal neuralgia    HTN (hypertension)    patient denies was taking a blood pressure medication in early 2000 but it was migraines   Leukopenia    MS (multiple sclerosis) ([place]) 1997   Neuropathic pain    Peripheral edema    Port catheter in place 12/31/2012   Right bundle branch block    Past Surgical History:  Procedure Laterality Date   ABDOMINAL HYSTERECTOMY     CHOLECYSTECTOMY     COLONOSCOPY WITH PROPOFOL N/A 09/27/2021   Procedure: COLONOSCOPY WITH PROPOFOL;  Surgeon: Daneil Dolin, MD;  [facility];  Service: Endoscopy;  Laterality: N/A;  8:00am, asa 2   ESOPHAGOGASTRODUODENOSCOPY  2011   Dr. Gala Romney. 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 Biopsy from the stomach revealed minimal chronic inactive inflammation but negative for H. pylori   LAPAROSCOPIC GASTRIC SLEEVE RESECTION N/A 04/17/2016   Procedure: LAPAROSCOPIC GASTRIC SLEEVE RESECTION WITH  UPPER ENDOSCOPY;  Surgeon: Excell Seltzer, MD;  [facility];  Service: General;  Laterality: N/A;   PATELLA-FEMORAL ARTHROPLASTY Right 07/14/2019   Procedure: PATELLA-FEMORAL ARTHROPLASTY;  Surgeon: Marchia Bond, MD;  [facility];  Service: Orthopedics;  Laterality: Right;   PORTACATH PLACEMENT     TUBAL LIGATION  2001   Patient Active Problem List   Diagnosis Date Noted   Gastroesophageal reflux disease 09/07/2021   Colon cancer screening 09/07/2021   Hx of hysterectomy 06/14/2021   Pneumonia due to COVID-19 virus 02/04/2020   Elevated transaminase level 02/04/2020   Patellofemoral arthritis of right knee 07/14/2019   History of DVT in adulthood 06/23/2019   Right leg pain 03/12/2017   Gait abnormality 03/12/2017   Radiculopathy due to lumbar intervertebral disc disorder 09/21/2016   Spondylosis without myelopathy or radiculopathy, lumbar region 09/21/2016   History of anxiety 09/08/2016   Primary osteoarthritis of right knee 08/30/2016   Primary insomnia 08/30/2016   Myofascial pain 08/30/2016   Sciatica, right side 12/12/2015   Right knee pain 12/12/2015   Morbid obesity ([place]) 08/12/2015   Lumbago 02/10/2014   Chronic anxiety 07/08/2013   Swelling of breast 04/08/2013   Chronic pain syndrome 03/11/2013   Port catheter in place 12/31/2012   Constipation 10/01/2012   Anemia 10/01/2012   Rectal bleeding 10/01/2012  Elevated alkaline phosphatase level 10/01/2012   MS (multiple sclerosis) ([place]) 09/02/2012   Anxiety 11/29/2011   Headache(784.0) 11/29/2011   ANEMIA 04/12/2010   OTHER DYSPHAGIA 04/12/2010    ONSET DATE: MS 1997  REFERRING DIAG: G35 (ICD-10-CM) - MS (multiple sclerosis) ([place]) M62.81 (ICD-10-CM) - Muscle weakness R27.0 (ICD-10-CM) - Ataxia M79.10 (ICD-10-CM) - Muscle pain   THERAPY DIAG:  Low back pain, unspecified back pain laterality, unspecified chronicity, unspecified whether sciatica present  Muscle weakness (generalized)  Multiple  sclerosis (HCC)  Difficulty in walking, not elsewhere classified  Other abnormalities of gait and mobility  Right knee pain, unspecified chronicity  Stiffness of right knee, not elsewhere classified  Rationale for Evaluation and Treatment Rehabilitation  SUBJECTIVE:                                                                                                                                                                                              SUBJECTIVE STATEMENT: Patient states real stiff. She has a lot of stiffness all over. Pain in Right thigh area.    PERTINENT HISTORY:   LBP;  TKA 2021 R ; then COVD shortly at the end of 2021  x 2 week in the hospital Also had some injections in the back per Dr. Brien Few that did help some  PAIN:  Are you having pain? Yes: NPRS scale: 7/10 Pain location: Right leg Pain description: constant throbbing Aggravating factors: lots of activity Relieving factors: medication  PRECAUTIONS: Fall  WEIGHT BEARING RESTRICTIONS No  FALLS: Has patient fallen in last 6 months? No  LIVING ENVIRONMENT: Lives with: lives with their family Lives in: House/apartment Stairs: Yes: External: 3 steps; none Has following equipment at home: Single point cane, Walker - 2 wheeled, shower chair, bed side commode, and Grab bars  PLOF: Independent  PATIENT GOALS  Get off of some of these pain meds  OBJECTIVE:   DIAGNOSTIC FINDINGS: none of lumbar spine  COGNITION: Overall cognitive status: Within functional limits for tasks assessed   SENSATION: feet from MS   POSTURE: flexed trunk        LUMBAR ROM:   Active  A/PROM  eval  Flexion Fingers to mid shin  Extension -10 degrees  Right lateral flexion To just below knee  Left lateral flexion To above knee  Right rotation Limited 70%  Left rotation Limted 70%    LOWER EXTREMITY MMT:    MMT Right Eval Left Eval  Hip flexion 4 5  Hip extension 3- 3-  Hip abduction    Hip  adduction    Hip internal rotation    Hip external rotation  Knee flexion    Knee extension 4+ 5  Ankle dorsiflexion 3+ 5  Ankle plantarflexion    Ankle inversion    Ankle eversion    (Blank rows = not tested)  BED MOBILITY:  Sit to supine Modified independence Supine to sit Modified independence Rolling to Right Modified independence Rolling to Left Modified independence   GAIT: Gait pattern: antalgic, decreased trunk rotation, trunk flexed, poor foot clearance- Right, and poor foot clearance- Left Distance walked: 223 Assistive device utilized: None Level of assistance: Modified independence and SBA Comments: fatigued at the end of walk has to sit before 2 min are up  FUNCTIONAL TESTs:  5 times sit to stand: 24 sec without arms 2 minute walk test: 223 ft  PATIENT SURVEYS:  FOTO 58  TODAY'S TREATMENT:  01/11/22 Prone press up 2x 10  Prone on elbows 1 - 2 minutes x 4 incline changes with HOB Hip dips/ lumbar extension with hands on counter x 5 Prone quad stretchy 3 x 20 second holds bilateral  Supine pelvic tilts a/p x 15  Seated pelvic tilts a/p x 15 Prone hip extension 1x 10   01/08/22 Review of HEP and goals  Prone: Prone on elbows x 3' Prone press up 2 x 5 Prone glute sets 5" hold x 10  Supine: Bridge x 10 LTR x 10 Abdominal bracing 5" hold x 10 Abdominal bracing with marching x 10 Scapular retraction 5" hold x 10  Standing: RTB  shoulder rows 2 x 10  Standing tall at wall x 30"      PATIENT EDUCATION:  Education details: 7/31 updated HEP; Patient educated on exam findings, POC, scope of PT, HEP Person educated: Patient Education method: Explanation, Demonstration, and Handouts Education comprehension: verbalized understanding, returned demonstration, verbal cues required, and tactile cues required    HOME EXERCISE PROGRAM: Access Code: [email or web address] URL: [email or web address]  01/11/22- Prone Hip Extension  - 1 x daily - 7 x  weekly - 1-2 sets - 10 reps - Prone Quadriceps Stretch with Strap  - 1 x daily - 7 x weekly - 3-5 reps - 20 second hold  Date: 01/08/2022 Prepared by: [facility] - Rehab  Exercises - Static Prone on Elbows  - 2 x daily - 7 x weekly - 1 sets - 1 reps - 3 min hold - Prone Press Up  - 2 x daily - 7 x weekly - 1 sets - 5 reps - Prone Gluteal Sets  - 2 x daily - 7 x weekly - 1 sets - 10 reps - 5" hold - Supine Bridge  - 2 x daily - 7 x weekly - 3 sets - 10 reps - Supine Lower Trunk Rotation  - 2 x daily - 7 x weekly - 1 sets - 10 reps - Supine Transversus Abdominis Bracing - Hands on Stomach  - 2 x daily - 7 x weekly - 1 sets - 10 reps - Supine March  - 2 x daily - 7 x weekly - 1 sets - 10 reps - Supine Scapular Retraction  - 2 x daily - 7 x weekly - 1 sets - 10 reps   GOALS: Goals reviewed with patient? No  SHORT TERM GOALS: Target date: 01/11/2022   patient will be independent with initial HEP  Baseline: Goal status: met  2.  Patient will demonstrate improved lumbar extension to neutral to improve endurance with standing, walking Baseline:  Goal status: ongoing    LONG TERM GOALS: Target date: 01/25/2022  Patient  will be independent with advanced HEP and self management strategies to improve quality of life and functional outcomes Baseline:  Goal status:ongoing  2.  Patient will improve 5 x STS score from 24 sec to 18 sec to demonstrate improved functional mobility and increased lower extremity strength.  Baseline:  Goal status: ongoing  3.  Patient will report at least 50% improvement in overall symptoms and/or function to demonstrate improved functional mobility Baseline:  Goal status: ongoing  4.  Patient will improve FOTO score to predicted value to demonstrate improved perceived functional mobility Baseline: 58 Goal status: ongoing  5.  Patient will increase distance on 2MWT to 280 ft to demonstrate improved functional gait for community ambulation  Baseline: 223 ft Goal  status: ongoing  ASSESSMENT:  CLINICAL IMPRESSION: Patient very limited with lumbar lumbar extension ROM noted with press ups. Trialed rasing HOB while prone on elbows for extended duration stretch. Decrease in symptoms following. Trialed hip dip/lumbar extension for improving extension ROM and symptoms with patient feeling improvement following. Patient with shortened hip flexors likely contributing to symptoms. Given cueing for pelvic tilts with good carry over. Patient with very limited hip extension ROM. Patient will continue to benefit from physical therapy in order to improve function and reduce impairment.     OBJECTIVE IMPAIRMENTS Abnormal gait, decreased activity tolerance, decreased balance, decreased endurance, decreased knowledge of condition, decreased knowledge of use of DME, decreased mobility, difficulty walking, decreased ROM, decreased strength, decreased safety awareness, hypomobility, increased fascial restrictions, impaired perceived functional ability, impaired flexibility, and pain.   ACTIVITY LIMITATIONS carrying, lifting, bending, sitting, standing, squatting, sleeping, stairs, reach over head, locomotion level, and caring for others  PARTICIPATION LIMITATIONS: meal prep, cleaning, laundry, shopping, and community activity  PERSONAL FACTORS 1 comorbidity: MS  are also affecting patient's functional outcome.   REHAB POTENTIAL: Good  CLINICAL DECISION MAKING: Evolving/moderate complexity  EVALUATION COMPLEXITY: Moderate  PLAN: PT FREQUENCY: 2x/week  PT DURATION: 4 weeks  PLANNED INTERVENTIONS: Therapeutic exercises, Therapeutic activity, Neuromuscular re-education, Balance training, Gait training, Patient/Family education, Joint manipulation, Joint mobilization, Stair training, Orthotic/Fit training, DME instructions, Aquatic Therapy, Dry Needling, Electrical stimulation, Spinal manipulation, Spinal mobilization, Cryotherapy, Moist heat, Compression bandaging, scar  mobilization, Splintting, Taping, Traction, Ultrasound, Ionotophoresis 46m/ml Dexamethasone, and Manual therapy   PLAN FOR NEXT SESSION:  progress general mobility and strengthening as able; focus on Postural strengthening; lumbar extension  2:19 PM, 01/11/22 AMearl LatinPT, DPT Physical Therapist [facility]

## 2022-01-12 ENCOUNTER — Telehealth: Payer: Self-pay | Admitting: *Deleted

## 2022-01-12 NOTE — Telephone Encounter (Signed)
Received message pt has not had labs done for procedure on Monday.  I tried calling pt at both #'s listed, no answer and not able to leave VM.

## 2022-01-12 NOTE — Telephone Encounter (Signed)
Received VM from pt she needed to reschedule procedure on for next week.   Called pt back, LMOVM to call back

## 2022-01-15 ENCOUNTER — Encounter (HOSPITAL_COMMUNITY): Payer: 59

## 2022-01-16 ENCOUNTER — Encounter (HOSPITAL_COMMUNITY): Payer: 59 | Admitting: Physical Therapy

## 2022-01-16 NOTE — Telephone Encounter (Signed)
Pt returned call. Scheduled for 8/31 at 12:30pm. Aware will send instructions via mychart. Lab instructions also sent to [place]  PA approved via [employer]. Auth# [id number], DOS: Dec 29, 2021 - Mar 29, 2022   PA done via [facility]. Auth# [id number], DOS: Dec 29, 2021 - Mar 29, 2022

## 2022-01-17 ENCOUNTER — Encounter (HOSPITAL_COMMUNITY): Payer: Self-pay | Admitting: Physical Therapy

## 2022-01-17 ENCOUNTER — Ambulatory Visit (HOSPITAL_COMMUNITY): Payer: 59 | Admitting: Physical Therapy

## 2022-01-17 DIAGNOSIS — R2689 Other abnormalities of gait and mobility: Secondary | ICD-10-CM

## 2022-01-17 DIAGNOSIS — M545 Low back pain, unspecified: Secondary | ICD-10-CM | POA: Diagnosis not present

## 2022-01-17 DIAGNOSIS — G35 Multiple sclerosis: Secondary | ICD-10-CM | POA: Diagnosis not present

## 2022-01-17 DIAGNOSIS — M25561 Pain in right knee: Secondary | ICD-10-CM

## 2022-01-17 DIAGNOSIS — R262 Difficulty in walking, not elsewhere classified: Secondary | ICD-10-CM | POA: Diagnosis not present

## 2022-01-17 DIAGNOSIS — M6281 Muscle weakness (generalized): Secondary | ICD-10-CM | POA: Diagnosis not present

## 2022-01-17 DIAGNOSIS — M25661 Stiffness of right knee, not elsewhere classified: Secondary | ICD-10-CM | POA: Diagnosis not present

## 2022-01-17 NOTE — Therapy (Signed)
OUTPATIENT PHYSICAL THERAPY LUMBAR TREATMENT   Patient Name: Melinda Moore MRN: [id number] DOB:Mar 28, 1971, 51 y.o., female Today's Date: 01/17/2022   PCP: Sallee Lange, MD REFERRING PROVIDER: Sallee Lange, MD   PT End of Session - 01/17/22 [phone]     Visit Number 4    Number of Visits 8    Date for PT Re-Evaluation 01/25/22    Authorization Type [employer] no ded, no VL, no auth required    PT Start Time 0943    PT Stop Time 1021    PT Time Calculation (min) 38 min    Activity Tolerance Patient tolerated treatment well    Behavior During Therapy [facility] for tasks assessed/performed              Past Medical History:  Diagnosis Date   Anemia    Anxiety    Bell's palsy    Depression    DVT (deep venous thrombosis) ([place]) 09/02/2012   [place] on 02/13/2012 showed acute DVT in left calf veins and posterior tibial veins   Fibromyalgia    GERD (gastroesophageal reflux disease)    History of blood transfusion 2000   History of trigeminal neuralgia    HTN (hypertension)    patient denies was taking a blood pressure medication in early 2000 but it was migraines   Leukopenia    MS (multiple sclerosis) ([place]) 1997   Neuropathic pain    Peripheral edema    Port catheter in place 12/31/2012   Right bundle branch block    Past Surgical History:  Procedure Laterality Date   ABDOMINAL HYSTERECTOMY     CHOLECYSTECTOMY     COLONOSCOPY WITH PROPOFOL N/A 09/27/2021   Procedure: COLONOSCOPY WITH PROPOFOL;  Surgeon: Daneil Dolin, MD;  Location: [facility];  Service: Endoscopy;  Laterality: N/A;  8:00am, asa 2   ESOPHAGOGASTRODUODENOSCOPY  2011   Dr. Gala Romney. 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 Biopsy from the stomach revealed minimal chronic inactive inflammation but negative for H. pylori   LAPAROSCOPIC GASTRIC SLEEVE RESECTION N/A 04/17/2016   Procedure: LAPAROSCOPIC GASTRIC SLEEVE RESECTION WITH  UPPER ENDOSCOPY;  Surgeon: Excell Seltzer, MD;  Location: [facility];  Service: General;  Laterality: N/A;   PATELLA-FEMORAL ARTHROPLASTY Right 07/14/2019   Procedure: PATELLA-FEMORAL ARTHROPLASTY;  Surgeon: Marchia Bond, MD;  Location: [facility];  Service: Orthopedics;  Laterality: Right;   PORTACATH PLACEMENT     TUBAL LIGATION  2001   Patient Active Problem List   Diagnosis Date Noted   Gastroesophageal reflux disease 09/07/2021   Colon cancer screening 09/07/2021   Hx of hysterectomy 06/14/2021   Pneumonia due to COVID-19 virus 02/04/2020   Elevated transaminase level 02/04/2020   Patellofemoral arthritis of right knee 07/14/2019   History of DVT in adulthood 06/23/2019   Right leg pain 03/12/2017   Gait abnormality 03/12/2017   Radiculopathy due to lumbar intervertebral disc disorder 09/21/2016   Spondylosis without myelopathy or radiculopathy, lumbar region 09/21/2016   History of anxiety 09/08/2016   Primary osteoarthritis of right knee 08/30/2016   Primary insomnia 08/30/2016   Myofascial pain 08/30/2016   Sciatica, right side 12/12/2015   Right knee pain 12/12/2015   Morbid obesity ([place]) 08/12/2015   Lumbago 02/10/2014   Chronic anxiety 07/08/2013   Swelling of breast 04/08/2013   Chronic pain syndrome 03/11/2013   Port catheter in place 12/31/2012   Constipation 10/01/2012   Anemia 10/01/2012   Rectal bleeding 10/01/2012  Elevated alkaline phosphatase level 10/01/2012   MS (multiple sclerosis) (H[facility]) 09/02/2012   Anxiety 11/29/2011   Headache(784.0) 11/29/2011   ANEMIA 04/12/2010   OTHER DYSPHAGIA 04/12/2010    ONSET DATE: MS 1997  REFERRING DIAG: G35 (ICD-10-CM) - MS (multiple sclerosis) (HCC) M62.81 (ICD-10-CM) - Muscle weakness R27.0 (ICD-10-CM) - Ataxia M79.10 (ICD-10-CM) - Muscle pain   THERAPY DIAG:  Low back pain, unspecified back pain laterality, unspecified chronicity, unspecified whether sciatica present  Muscle weakness (generalized)  Multiple  sclerosis (HCC)  Difficulty in walking, not elsewhere classified  Other abnormalities of gait and mobility  Right knee pain, unspecified chronicity  Stiffness of right knee, not elsewhere classified  Rationale for Evaluation and Treatment Rehabilitation  SUBJECTIVE:                                                                                                                                                                                              SUBJECTIVE STATEMENT: Patient states exercises went well. Having trouble with one of the exercises. Hip dips at counter are good. Thinks exercises are helping.    PERTINENT HISTORY:   LBP;  TKA 2021 R ; then COVD shortly at the end of 2021  x 2 week in the hospital Also had some injections in the back per Dr. Bartko that did help some  PAIN:  Are you having pain? Yes: NPRS scale: 6/10 Pain location: Right leg Pain description: constant throbbing Aggravating factors: lots of activity Relieving factors: medication  PRECAUTIONS: Fall  WEIGHT BEARING RESTRICTIONS No  FALLS: Has patient fallen in last 6 months? No  LIVING ENVIRONMENT: Lives with: lives with their family Lives in: House/apartment Stairs: Yes: External: 3 steps; none Has following equipment at home: Single point cane, Walker - 2 wheeled, shower chair, bed side commode, and Grab bars  PLOF: Independent  PATIENT GOALS  Get off of some of these pain meds  OBJECTIVE:   DIAGNOSTIC FINDINGS: none of lumbar spine  COGNITION: Overall cognitive status: Within functional limits for tasks assessed   SENSATION: feet from MS   POSTURE: flexed trunk        LUMBAR ROM:   Active  A/PROM  eval  Flexion Fingers to mid shin  Extension -10 degrees  Right lateral flexion To just below knee  Left lateral flexion To above knee  Right rotation Limited 70%  Left rotation Limted 70%    LOWER EXTREMITY MMT:    MMT Right Eval Left Eval  Hip flexion 4 5  Hip  extension 3- 3-  Hip abduction    Hip adduction    Hip internal rotation      Hip external rotation    Knee flexion    Knee extension 4+ 5  Ankle dorsiflexion 3+ 5  Ankle plantarflexion    Ankle inversion    Ankle eversion    (Blank rows = not tested)  BED MOBILITY:  Sit to supine Modified independence Supine to sit Modified independence Rolling to Right Modified independence Rolling to Left Modified independence   GAIT: Gait pattern: antalgic, decreased trunk rotation, trunk flexed, poor foot clearance- Right, and poor foot clearance- Left Distance walked: 223 Assistive device utilized: None Level of assistance: Modified independence and SBA Comments: fatigued at the end of walk has to sit before 2 min are up  FUNCTIONAL TESTs:  5 times sit to stand: 24 sec without arms 2 minute walk test: 223 ft  PATIENT SURVEYS:  FOTO 58  TODAY'S TREATMENT:  01/17/22 Hip dips/ lumbar extension with hands on counter x 10 Prone quad stretch 4 x 20 second holds bilateral  Prone hip extension 1x 10 bilateral  Supine pelvic tilts a/p 2x 20   Bridge 2x 10 5 second holds Standing hip flexor stretch at step 5x 20 second holds bilateral  Standing row BTB 2x 10  Standing shoulder extension BTB 2x 10   01/11/22 Prone press up 2x 10  Prone on elbows 1 - 2 minutes x 4 incline changes with HOB Hip dips/ lumbar extension with hands on counter x 5 Prone quad stretch 3 x 20 second holds bilateral  Supine pelvic tilts a/p x 15  Seated pelvic tilts a/p x 15 Prone hip extension 1x 10   01/08/22 Review of HEP and goals  Prone: Prone on elbows x 3' Prone press up 2 x 5 Prone glute sets 5" hold x 10  Supine: Bridge x 10 LTR x 10 Abdominal bracing 5" hold x 10 Abdominal bracing with marching x 10 Scapular retraction 5" hold x 10  Standing: RTB  shoulder rows 2 x 10  Standing tall at wall x 30"   PATIENT EDUCATION:  Education details: 7/31 updated HEP; Patient educated on exam findings,  POC, scope of PT, HEP Person educated: Patient Education method: Explanation, Demonstration, and Handouts Education comprehension: verbalized understanding, returned demonstration, verbal cues required, and tactile cues required    HOME EXERCISE PROGRAM: Access Code: [email or web address] URL: [email or web address] 01/17/22 - Hip Flexor Stretch with Chair  - 1 x daily - 7 x weekly - 5 reps - 20 second hold  01/11/22- Prone Hip Extension  - 1 x daily - 7 x weekly - 1-2 sets - 10 reps - Prone Quadriceps Stretch with Strap  - 1 x daily - 7 x weekly - 3-5 reps - 20 second hold  Date: 01/08/2022 Prepared by: [facility] - [facility]  Exercises - Static Prone on Elbows  - 2 x daily - 7 x weekly - 1 sets - 1 reps - 3 min hold - Prone Press Up  - 2 x daily - 7 x weekly - 1 sets - 5 reps - Prone Gluteal Sets  - 2 x daily - 7 x weekly - 1 sets - 10 reps - 5" hold - Supine Bridge  - 2 x daily - 7 x weekly - 3 sets - 10 reps - Supine Lower Trunk Rotation  - 2 x daily - 7 x weekly - 1 sets - 10 reps - Supine Transversus Abdominis Bracing - Hands on Stomach  - 2 x daily - 7 x weekly - 1 sets - 10 reps - Supine March  -  2 x daily - 7 x weekly - 1 sets - 10 reps - Supine Scapular Retraction  - 2 x daily - 7 x weekly - 1 sets - 10 reps   GOALS: Goals reviewed with patient? No  SHORT TERM GOALS: Target date: 01/11/2022   patient will be independent with initial HEP  Baseline: Goal status: met  2.  Patient will demonstrate improved lumbar extension to neutral to improve endurance with standing, walking Baseline:  Goal status: ongoing    LONG TERM GOALS: Target date: 01/25/2022  Patient will be independent with advanced HEP and self management strategies to improve quality of life and functional outcomes Baseline:  Goal status:ongoing  2.  Patient will improve 5 x STS score from 24 sec to 18 sec to demonstrate improved functional mobility and increased lower extremity strength.  Baseline:  Goal status:  ongoing  3.  Patient will report at least 50% improvement in overall symptoms and/or function to demonstrate improved functional mobility Baseline:  Goal status: ongoing  4.  Patient will improve FOTO score to predicted value to demonstrate improved perceived functional mobility Baseline: 58 Goal status: ongoing  5.  Patient will increase distance on 2MWT to 280 ft to demonstrate improved functional gait for community ambulation  Baseline: 223 ft Goal status: ongoing  ASSESSMENT:  CLINICAL IMPRESSION: Patient continues to ambulate in slightly flexed/crouched position. She demonstrates improving extension with increased reps of hip dips/lumbar extension. She tolerates increased reps of previously completed exercises and performs with good mechanics with minimal to no cueing. Added resisted postural strengthening which is tolerated well. Patient will continue to benefit from physical therapy in order to improve function and reduce impairment.      OBJECTIVE IMPAIRMENTS Abnormal gait, decreased activity tolerance, decreased balance, decreased endurance, decreased knowledge of condition, decreased knowledge of use of DME, decreased mobility, difficulty walking, decreased ROM, decreased strength, decreased safety awareness, hypomobility, increased fascial restrictions, impaired perceived functional ability, impaired flexibility, and pain.   ACTIVITY LIMITATIONS carrying, lifting, bending, sitting, standing, squatting, sleeping, stairs, reach over head, locomotion level, and caring for others  PARTICIPATION LIMITATIONS: meal prep, cleaning, laundry, shopping, and community activity  PERSONAL FACTORS 1 comorbidity: MS  are also affecting patient's functional outcome.   REHAB POTENTIAL: Good  CLINICAL DECISION MAKING: Evolving/moderate complexity  EVALUATION COMPLEXITY: Moderate  PLAN: PT FREQUENCY: 2x/week  PT DURATION: 4 weeks  PLANNED INTERVENTIONS: Therapeutic exercises,  Therapeutic activity, Neuromuscular re-education, Balance training, Gait training, Patient/Family education, Joint manipulation, Joint mobilization, Stair training, Orthotic/Fit training, DME instructions, Aquatic Therapy, Dry Needling, Electrical stimulation, Spinal manipulation, Spinal mobilization, Cryotherapy, Moist heat, Compression bandaging, scar mobilization, Splintting, Taping, Traction, Ultrasound, Ionotophoresis 84m/ml Dexamethasone, and Manual therapy   PLAN FOR NEXT SESSION:  progress general mobility and strengthening as able; focus on Postural strengthening; lumbar extension  9:43 AM, 01/17/22 AMearl LatinPT, DPT [facility]

## 2022-01-18 ENCOUNTER — Encounter (HOSPITAL_COMMUNITY): Payer: 59

## 2022-01-18 ENCOUNTER — Other Ambulatory Visit: Payer: Self-pay | Admitting: Family Medicine

## 2022-01-18 ENCOUNTER — Encounter (HOSPITAL_COMMUNITY): Payer: 59 | Admitting: Physical Therapy

## 2022-01-19 ENCOUNTER — Encounter (HOSPITAL_COMMUNITY): Payer: 59

## 2022-01-23 ENCOUNTER — Encounter (HOSPITAL_COMMUNITY): Payer: 59 | Admitting: Physical Therapy

## 2022-01-24 ENCOUNTER — Encounter (HOSPITAL_COMMUNITY): Payer: Self-pay

## 2022-01-24 ENCOUNTER — Ambulatory Visit (HOSPITAL_COMMUNITY): Payer: 59

## 2022-01-24 DIAGNOSIS — M25661 Stiffness of right knee, not elsewhere classified: Secondary | ICD-10-CM

## 2022-01-24 DIAGNOSIS — R2689 Other abnormalities of gait and mobility: Secondary | ICD-10-CM

## 2022-01-24 DIAGNOSIS — M25561 Pain in right knee: Secondary | ICD-10-CM | POA: Diagnosis not present

## 2022-01-24 DIAGNOSIS — R262 Difficulty in walking, not elsewhere classified: Secondary | ICD-10-CM | POA: Diagnosis not present

## 2022-01-24 DIAGNOSIS — G35 Multiple sclerosis: Secondary | ICD-10-CM | POA: Diagnosis not present

## 2022-01-24 DIAGNOSIS — M6281 Muscle weakness (generalized): Secondary | ICD-10-CM

## 2022-01-24 DIAGNOSIS — M545 Low back pain, unspecified: Secondary | ICD-10-CM | POA: Diagnosis not present

## 2022-01-24 NOTE — Therapy (Signed)
OUTPATIENT PHYSICAL THERAPY LUMBAR TREATMENT   Patient Name: Melinda Moore MRN: [id number] DOB:Jan 16, 1971, 51 y.o., female Today's Date: 01/24/2022   PCP: Sallee Lange, MD REFERRING PROVIDER: Sallee Lange, MD   PT End of Session - 01/24/22 1117     Visit Number 5    Number of Visits 8    Date for PT Re-Evaluation 01/25/22    Authorization Type UHC no ded, no VL, no auth required    PT Start Time 1118    PT Stop Time 9166    PT Time Calculation (min) 38 min    Activity Tolerance Patient tolerated treatment well    Behavior During Therapy WFL for tasks assessed/performed              Past Medical History:  Diagnosis Date   Anemia    Anxiety    Bell's palsy    Depression    DVT (deep venous thrombosis) ([place]) 09/02/2012   [place] on 02/13/2012 showed acute DVT in left calf veins and posterior tibial veins   Fibromyalgia    GERD (gastroesophageal reflux disease)    History of blood transfusion 2000   History of trigeminal neuralgia    HTN (hypertension)    patient denies was taking a blood pressure medication in early 2000 but it was migraines   Leukopenia    MS (multiple sclerosis) ([place]) 1997   Neuropathic pain    Peripheral edema    Port catheter in place 12/31/2012   Right bundle branch block    Past Surgical History:  Procedure Laterality Date   ABDOMINAL HYSTERECTOMY     CHOLECYSTECTOMY     COLONOSCOPY WITH PROPOFOL N/A 09/27/2021   Procedure: COLONOSCOPY WITH PROPOFOL;  Surgeon: Daneil Dolin, MD;  Location: [facility];  Service: Endoscopy;  Laterality: N/A;  8:00am, asa 2   ESOPHAGOGASTRODUODENOSCOPY  2011   Dr. Gala Romney. 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 Biopsy from the stomach revealed minimal chronic inactive inflammation but negative for H. pylori   LAPAROSCOPIC GASTRIC SLEEVE RESECTION N/A 04/17/2016   Procedure: LAPAROSCOPIC GASTRIC SLEEVE RESECTION WITH  UPPER ENDOSCOPY;  Surgeon: Excell Seltzer, MD;  Location: [facility];  Service: General;  Laterality: N/A;   PATELLA-FEMORAL ARTHROPLASTY Right 07/14/2019   Procedure: PATELLA-FEMORAL ARTHROPLASTY;  Surgeon: Marchia Bond, MD;  Location: [facility];  Service: Orthopedics;  Laterality: Right;   PORTACATH PLACEMENT     TUBAL LIGATION  2001   Patient Active Problem List   Diagnosis Date Noted   Gastroesophageal reflux disease 09/07/2021   Colon cancer screening 09/07/2021   Hx of hysterectomy 06/14/2021   Pneumonia due to COVID-19 virus 02/04/2020   Elevated transaminase level 02/04/2020   Patellofemoral arthritis of right knee 07/14/2019   History of DVT in adulthood 06/23/2019   Right leg pain 03/12/2017   Gait abnormality 03/12/2017   Radiculopathy due to lumbar intervertebral disc disorder 09/21/2016   Spondylosis without myelopathy or radiculopathy, lumbar region 09/21/2016   History of anxiety 09/08/2016   Primary osteoarthritis of right knee 08/30/2016   Primary insomnia 08/30/2016   Myofascial pain 08/30/2016   Sciatica, right side 12/12/2015   Right knee pain 12/12/2015   Morbid obesity ([place]) 08/12/2015   Lumbago 02/10/2014   Chronic anxiety 07/08/2013   Swelling of breast 04/08/2013   Chronic pain syndrome 03/11/2013   Port catheter in place 12/31/2012   Constipation 10/01/2012   Anemia 10/01/2012   Rectal bleeding 10/01/2012  Elevated alkaline phosphatase level 10/01/2012   MS (multiple sclerosis) ([place]) 09/02/2012   Anxiety 11/29/2011   Headache(784.0) 11/29/2011   ANEMIA 04/12/2010   OTHER DYSPHAGIA 04/12/2010    ONSET DATE: MS 1997  REFERRING DIAG: G35 (ICD-10-CM) - MS (multiple sclerosis) ([place]) M62.81 (ICD-10-CM) - Muscle weakness R27.0 (ICD-10-CM) - [place]taxia M79.10 (ICD-10-CM) - Muscle pain   THERAPY DIAG:  Low back pain, unspecified back pain laterality, unspecified chronicity, unspecified whether sciatica present  Muscle weakness (generalized)  Multiple  sclerosis ([facility])  Difficulty in walking, not elsewhere classified  Other abnormalities of gait and mobility  Right knee pain, unspecified chronicity  Stiffness of right knee, not elsewhere classified  Rationale for Evaluation and Treatment Rehabilitation  SUBJECTIVE:                                                                                                                                                                                              SUBJECTIVE STATEMENT: Patient states continued right leg pain but that is fairly constantly and concern for sciatica.  She reports that a big change in status from her overall health was covid in 2021 and now slowly increasing activity. Enjoys therapy and feeling it is very helpful. "After last time, I walked out taller and felt good"     PERTINENT HISTORY:   LBP;  TKA 2021 R ; then COVD shortly at the end of 2021  x 2 week in the hospital Also had some injections in the back per Dr. Brien Few that did help some  PAIN:  Are you having pain? Yes: NPRS scale: 6/10 Pain location: Right leg Pain description: constant throbbing Aggravating factors: lots of activity Relieving factors: medication  PRECAUTIONS: Fall  WEIGHT BEARING RESTRICTIONS No  FALLS: Has patient fallen in last 6 months? No  LIVING ENVIRONMENT: Lives with: lives with their family Lives in: House/apartment Stairs: Yes: External: 3 steps; none Has following equipment at home: Single point cane, Walker - 2 wheeled, shower chair, bed side commode, and Grab bars  PLOF: Independent  PATIENT GOALS  Get off of some of these pain meds  OBJECTIVE:   DIAGNOSTIC FINDINGS: none of lumbar spine  COGNITION: Overall cognitive status: Within functional limits for tasks assessed   SENSATION: feet from MS   POSTURE: flexed trunk        LUMBAR ROM:   Active  A/PROM  eval  Flexion Fingers to mid shin  Extension -10 degrees  Right lateral flexion To just below  knee  Left lateral flexion To above knee  Right rotation Limited 70%  Left rotation Limted 70%    LOWER EXTREMITY  MMT:    MMT Right Eval Left Eval  Hip flexion 4 5  Hip extension 3- 3-  Hip abduction    Hip adduction    Hip internal rotation    Hip external rotation    Knee flexion    Knee extension 4+ 5  Ankle dorsiflexion 3+ 5  Ankle plantarflexion    Ankle inversion    Ankle eversion    (Blank rows = not tested)  BED MOBILITY:  Sit to supine Modified independence Supine to sit Modified independence Rolling to Right Modified independence Rolling to Left Modified independence   GAIT: Gait pattern: antalgic, decreased trunk rotation, trunk flexed, poor foot clearance- Right, and poor foot clearance- Left Distance walked: 223 Assistive device utilized: None Level of assistance: Modified independence and SBA Comments: fatigued at the end of walk has to sit before 2 min are up  FUNCTIONAL TESTs:  5 times sit to stand: 24 sec without arms 2 minute walk test: 223 ft  PATIENT SURVEYS:  FOTO 58  TODAY'S TREATMENT:  01/24/22  Dynamic warm up 8 mins on nustep level 1 seat 12   There-Ex = Standing in // bars - Hip dips/ lumbar extension with hands on 2 bars to allow increased space open x 10 reps - Standing lumbar extensions hands on hips x 10 reps - Standing hip extensions x 10 reps B x 2 rounds - Standing lateral sways x 10 reps B  - Standing pelvic tilts for postural awareness x 10 reps - Standing hip abduction x 10 reps B x 2 rounds  - Standing heel raises with cue for tall posture x 10 reps  - Standing row BTB 2x 10  - Standing shoulder extension BTB 2x 1 - Standing hip flexor stretch at step 5x 20 second holds bilateral  There-Ex = sitting in chair - Piriformis stretch hold heel to op knee cross body stretch x 10 sec hold B x 6 - Sitting hamstring stretch with ankle flossing x 20 reps B x 2 rounds  01/17/22 Hip dips/ lumbar extension with hands on counter x  10 Prone quad stretch 4 x 20 second holds bilateral  Prone hip extension 1x 10 bilateral  Supine pelvic tilts a/p 2x 20   Bridge 2x 10 5 second holds Standing hip flexor stretch at step 5x 20 second holds bilateral  Standing row BTB 2x 10  Standing shoulder extension BTB 2x 10   01/11/22 Prone press up 2x 10  Prone on elbows 1 - 2 minutes x 4 incline changes with HOB Hip dips/ lumbar extension with hands on counter x 5 Prone quad stretch 3 x 20 second holds bilateral  Supine pelvic tilts a/p x 15  Seated pelvic tilts a/p x 15 Prone hip extension 1x 10   01/08/22 Review of HEP and goals  Prone: Prone on elbows x 3' Prone press up 2 x 5 Prone glute sets 5" hold x 10  Supine: Bridge x 10 LTR x 10 Abdominal bracing 5" hold x 10 Abdominal bracing with marching x 10 Scapular retraction 5" hold x 10  Standing: RTB  shoulder rows 2 x 10  Standing tall at wall x 30"   PATIENT EDUCATION:  Education details: 7/31 updated HEP; Patient educated on exam findings, POC, scope of PT, HEP 01/24/22 - priformis stretching, sciatic nerve tension Person educated: Patient Education method: Explanation, Demonstration, and Handouts Education comprehension: verbalized understanding, returned demonstration, verbal cues required, and tactile cues required    HOME EXERCISE PROGRAM:  Access Code: [email or web address] URL: [email or web address] 01/17/22 - Hip Flexor Stretch with Chair  - 1 x daily - 7 x weekly - 5 reps - 20 second hold  01/11/22- Prone Hip Extension  - 1 x daily - 7 x weekly - 1-2 sets - 10 reps - Prone Quadriceps Stretch with Strap  - 1 x daily - 7 x weekly - 3-5 reps - 20 second hold  Date: 01/08/2022 Prepared by: [facility]  Exercises - Static Prone on Elbows  - 2 x daily - 7 x weekly - 1 sets - 1 reps - 3 min hold - Prone Press Up  - 2 x daily - 7 x weekly - 1 sets - 5 reps - Prone Gluteal Sets  - 2 x daily - 7 x weekly - 1 sets - 10 reps - 5" hold - Supine Bridge  - 2 x  daily - 7 x weekly - 3 sets - 10 reps - Supine Lower Trunk Rotation  - 2 x daily - 7 x weekly - 1 sets - 10 reps - Supine Transversus Abdominis Bracing - Hands on Stomach  - 2 x daily - 7 x weekly - 1 sets - 10 reps - Supine March  - 2 x daily - 7 x weekly - 1 sets - 10 reps - Supine Scapular Retraction  - 2 x daily - 7 x weekly - 1 sets - 10 reps   GOALS: Goals reviewed with patient? No  SHORT TERM GOALS: Target date: 01/11/2022   patient will be independent with initial HEP  Baseline: Goal status: met  2.  Patient will demonstrate improved lumbar extension to neutral to improve endurance with standing, walking Baseline:  Goal status: ongoing    LONG TERM GOALS: Target date: 01/25/2022  Patient will be independent with advanced HEP and self management strategies to improve quality of life and functional outcomes Baseline:  Goal status:ongoing  2.  Patient will improve 5 x STS score from 24 sec to 18 sec to demonstrate improved functional mobility and increased lower extremity strength.  Baseline:  Goal status: ongoing  3.  Patient will report at least 50% improvement in overall symptoms and/or function to demonstrate improved functional mobility Baseline:  Goal status: ongoing  4.  Patient will improve FOTO score to predicted value to demonstrate improved perceived functional mobility Baseline: 58 Goal status: ongoing  5.  Patient will increase distance on 2MWT to 280 ft to demonstrate improved functional gait for community ambulation  Baseline: 223 ft Goal status: ongoing  ASSESSMENT:  CLINICAL IMPRESSION: Today's session continued to build on lumbar stabilization, postural awareness and overall conditioning with good tolerance.  Patient demonstrated overall forward flexion posture upon entering, preformed movements into posture activities with some guard but good control, and exited with improved upright alignment.  Able to tolerate increase in upright activity with  some small breaks to sit and during breaks shown sciatic opening stretches to support sciatic concerns.  Patient will continue to benefit from physical therapy in order to improve function and reduce impairment.      OBJECTIVE IMPAIRMENTS Abnormal gait, decreased activity tolerance, decreased balance, decreased endurance, decreased knowledge of condition, decreased knowledge of use of DME, decreased mobility, difficulty walking, decreased ROM, decreased strength, decreased safety awareness, hypomobility, increased fascial restrictions, impaired perceived functional ability, impaired flexibility, and pain.   ACTIVITY LIMITATIONS carrying, lifting, bending, sitting, standing, squatting, sleeping, stairs, reach over head, locomotion level, and caring for others  PARTICIPATION LIMITATIONS: meal prep,  cleaning, laundry, shopping, and community activity  PERSONAL FACTORS 1 comorbidity: MS  are also affecting patient's functional outcome.   REHAB POTENTIAL: Good  CLINICAL DECISION MAKING: Evolving/moderate complexity  EVALUATION COMPLEXITY: Moderate  PLAN: PT FREQUENCY: 2x/week  PT DURATION: 4 weeks  PLANNED INTERVENTIONS: Therapeutic exercises, Therapeutic activity, Neuromuscular re-education, Balance training, Gait training, Patient/Family education, Joint manipulation, Joint mobilization, Stair training, Orthotic/Fit training, DME instructions, Aquatic Therapy, Dry Needling, Electrical stimulation, Spinal manipulation, Spinal mobilization, Cryotherapy, Moist heat, Compression bandaging, scar mobilization, Splintting, Taping, Traction, Ultrasound, Ionotophoresis 20m/ml Dexamethasone, and Manual therapy   PLAN FOR NEXT SESSION:  progress general mobility and strengthening as able; focus on Postural strengthening; lumbar extension    11:58 AM, 01/24/22  PMargarette Asal CCarlis AbbottPT, DPT  Contract Physical Therapist at  [phone]

## 2022-01-25 ENCOUNTER — Encounter (HOSPITAL_COMMUNITY): Payer: 59

## 2022-01-26 ENCOUNTER — Ambulatory Visit (HOSPITAL_COMMUNITY): Payer: 59 | Admitting: Physical Therapy

## 2022-01-31 ENCOUNTER — Ambulatory Visit (HOSPITAL_COMMUNITY): Payer: 59 | Admitting: Physical Therapy

## 2022-01-31 DIAGNOSIS — M545 Low back pain, unspecified: Secondary | ICD-10-CM

## 2022-01-31 DIAGNOSIS — R262 Difficulty in walking, not elsewhere classified: Secondary | ICD-10-CM | POA: Diagnosis not present

## 2022-01-31 DIAGNOSIS — M25661 Stiffness of right knee, not elsewhere classified: Secondary | ICD-10-CM | POA: Diagnosis not present

## 2022-01-31 DIAGNOSIS — R2689 Other abnormalities of gait and mobility: Secondary | ICD-10-CM | POA: Diagnosis not present

## 2022-01-31 DIAGNOSIS — M6281 Muscle weakness (generalized): Secondary | ICD-10-CM | POA: Diagnosis not present

## 2022-01-31 DIAGNOSIS — G35 Multiple sclerosis: Secondary | ICD-10-CM

## 2022-01-31 DIAGNOSIS — M25561 Pain in right knee: Secondary | ICD-10-CM | POA: Diagnosis not present

## 2022-01-31 NOTE — Therapy (Signed)
OUTPATIENT PHYSICAL THERAPY LUMBAR TREATMENT Progress Note Reporting Period 12/28/21 to 01/31/22  See note below for Objective Data and Assessment of Progress/Goals.     Patient Name: Melinda Moore MRN: [id number] DOB:25-Jun-1970, 51 y.o., female Today's Date: 01/31/2022   PCP: Sallee Lange, MD REFERRING PROVIDER: Sallee Lange, MD   PT End of Session - 01/31/22 1431     Visit Number 6    Number of Visits 14    Date for PT Re-Evaluation 02/23/22    Authorization Type UHC no ded, no VL, no auth required    Progress Note Due on Visit 16    PT Start Time 6283    PT Stop Time 1515    PT Time Calculation (min) 41 min    Activity Tolerance Patient tolerated treatment well    Behavior During Therapy WFL for tasks assessed/performed              Past Medical History:  Diagnosis Date   Anemia    Anxiety    Bell's palsy    Depression    DVT (deep venous thrombosis) ([place]) 09/02/2012   [place] on 02/13/2012 showed acute DVT in left calf veins and posterior tibial veins   Fibromyalgia    GERD (gastroesophageal reflux disease)    History of blood transfusion 2000   History of trigeminal neuralgia    HTN (hypertension)    patient denies was taking a blood pressure medication in early 2000 but it was migraines   Leukopenia    MS (multiple sclerosis) ([place]) 1997   Neuropathic pain    Peripheral edema    Port catheter in place 12/31/2012   Right bundle branch block    Past Surgical History:  Procedure Laterality Date   ABDOMINAL HYSTERECTOMY     CHOLECYSTECTOMY     COLONOSCOPY WITH PROPOFOL N/A 09/27/2021   Procedure: COLONOSCOPY WITH PROPOFOL;  Surgeon: Daneil Dolin, MD;  Location: [facility];  Service: Endoscopy;  Laterality: N/A;  8:00am, asa 2   ESOPHAGOGASTRODUODENOSCOPY  2011   Dr. Gala Romney. 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 Biopsy from the stomach revealed minimal chronic  inactive inflammation but negative for H. pylori   LAPAROSCOPIC GASTRIC SLEEVE RESECTION N/A 04/17/2016   Procedure: LAPAROSCOPIC GASTRIC SLEEVE RESECTION WITH UPPER ENDOSCOPY;  Surgeon: Excell Seltzer, MD;  Location: [facility];  Service: General;  Laterality: N/A;   PATELLA-FEMORAL ARTHROPLASTY Right 07/14/2019   Procedure: PATELLA-FEMORAL ARTHROPLASTY;  Surgeon: Marchia Bond, MD;  Location: [facility];  Service: Orthopedics;  Laterality: Right;   PORTACATH PLACEMENT     TUBAL LIGATION  2001   Patient Active Problem List   Diagnosis Date Noted   Gastroesophageal reflux disease 09/07/2021   Colon cancer screening 09/07/2021   Hx of hysterectomy 06/14/2021   Pneumonia due to COVID-19 virus 02/04/2020   Elevated transaminase level 02/04/2020   Patellofemoral arthritis of right knee 07/14/2019   History of DVT in adulthood 06/23/2019   Right leg pain 03/12/2017   Gait abnormality 03/12/2017   Radiculopathy due to lumbar intervertebral disc disorder 09/21/2016   Spondylosis without myelopathy or radiculopathy, lumbar region 09/21/2016   History of anxiety 09/08/2016   Primary osteoarthritis of right knee 08/30/2016   Primary insomnia 08/30/2016   Myofascial pain 08/30/2016   Sciatica, right side 12/12/2015   Right knee pain 12/12/2015   Morbid obesity ([place]) 08/12/2015   Lumbago 02/10/2014   Chronic anxiety 07/08/2013   Swelling of  breast 04/08/2013   Chronic pain syndrome 03/11/2013   Port catheter in place 12/31/2012   Constipation 10/01/2012   Anemia 10/01/2012   Rectal bleeding 10/01/2012   Elevated alkaline phosphatase level 10/01/2012   MS (multiple sclerosis) ([place]) 09/02/2012   Anxiety 11/29/2011   Headache(784.0) 11/29/2011   ANEMIA 04/12/2010   OTHER DYSPHAGIA 04/12/2010    ONSET DATE: MS 1997  REFERRING DIAG: G35 (ICD-10-CM) - MS (multiple sclerosis) ([place]) M62.81 (ICD-10-CM) - Muscle weakness R27.0 (ICD-10-CM) - [place] M79.10 (ICD-10-CM) - Muscle pain   THERAPY  DIAG:  Low back pain, unspecified back pain laterality, unspecified chronicity, unspecified whether sciatica present  Muscle weakness (generalized)  Multiple sclerosis ([facility])  Rationale for Evaluation and Treatment Rehabilitation  SUBJECTIVE:                                                                                                                                                                                              SUBJECTIVE STATEMENT: Patient states she has improved approximately 40% since beginning therapy 4 weeks ago.  Continues to have right leg pain (anterior medial quad) and back soreness. Reports therapy is definitely helping and feels she is stronger and taller.     PERTINENT HISTORY:  LBP;  TKA 2021 R ; then COVID shortly at the end of 2021  x 2 week in the hospital Also had some injections in the back per Dr. Brien Few that did help some  PAIN:  Are you having pain? Yes: NPRS scale: 6/10 Pain location: Right leg Pain description: constant throbbing Aggravating factors: lots of activity Relieving factors: medication  PRECAUTIONS: Fall  WEIGHT BEARING RESTRICTIONS No  FALLS: Has patient fallen in last 6 months? No  LIVING ENVIRONMENT: Lives with: lives with their family Lives in: House/apartment Stairs: Yes: External: 3 steps; none Has following equipment at home: Single point cane, Walker - 2 wheeled, shower chair, bed side commode, and Grab bars  PLOF: Independent  PATIENT GOALS  Get off of some of these pain meds  OBJECTIVE:   DIAGNOSTIC FINDINGS: none of lumbar spine  COGNITION: Overall cognitive status: Within functional limits for tasks assessed   SENSATION: feet from MS   POSTURE: flexed trunk        LUMBAR ROM:   Active  A/PROM  eval A/PROM 01/31/22  Flexion Fingers to mid shin Fingertip to ankle  Extension -10 degrees -5 degrees from neutral  Right lateral flexion To just below knee Fingertip below knee  Left lateral flexion  To above knee Fingertip to kne  Right rotation Limited 70% Limited 40%  Left rotation Limted 70% Limited 40%  LOWER EXTREMITY MMT:    MMT Right Eval Right 01/31/22 Left Eval Left 01/31/22  Hip flexion 4 4+ 5 5  Hip extension 3- 3- 3- 3-  Hip abduction  3+  4  Hip adduction      Hip internal rotation      Hip external rotation      Knee flexion      Knee extension 4+ '5 5 5  ' Ankle dorsiflexion 3+ 4+ 5 5  Ankle plantarflexion      Ankle inversion      Ankle eversion      (Blank rows = not tested)  BED MOBILITY:  Sit to supine Modified independence Supine to sit Modified independence Rolling to Right Modified independence Rolling to Left Modified independence   GAIT: Gait pattern: antalgic, decreased trunk rotation, trunk flexed, poor foot clearance- Right, and poor foot clearance- Left Distance walked: 223 Assistive device utilized: None Level of assistance: Modified independence and SBA Comments: fatigued at the end of walk has to sit before 2 min are up  FUNCTIONAL TESTs:  5 times sit to stand: 01/31/22: 17 sec without UE; 12/28/21: 24 sec without arms 2 minute walk test: 01/31/22:  feet without AD; 12/28/21: 223 ft without AD  PATIENT SURVEYS:  FOTO   01/31/22:  60%  functional status; 12/28/21: 58% functional status  TODAY'S TREATMENT:  01/31/22 Progress Note completed Functional test measures FOTO MMT/AROM testing   01/24/22  Dynamic warm up 8 mins on nustep level 1 seat 12   There-Ex = Standing in // bars - Hip dips/ lumbar extension with hands on 2 bars to allow increased space open x 10 reps - Standing lumbar extensions hands on hips x 10 reps - Standing hip extensions x 10 reps B x 2 rounds - Standing lateral sways x 10 reps B  - Standing pelvic tilts for postural awareness x 10 reps - Standing hip abduction x 10 reps B x 2 rounds  - Standing heel raises with cue for tall posture x 10 reps  - Standing row BTB 2x 10  - Standing shoulder extension BTB 2x  1 - Standing hip flexor stretch at step 5x 20 second holds bilateral  There-Ex = sitting in chair - Piriformis stretch hold heel to op knee cross body stretch x 10 sec hold B x 6 - Sitting hamstring stretch with ankle flossing x 20 reps B x 2 rounds  01/17/22 Hip dips/ lumbar extension with hands on counter x 10 Prone quad stretch 4 x 20 second holds bilateral  Prone hip extension 1x 10 bilateral  Supine pelvic tilts a/p 2x 20   Bridge 2x 10 5 second holds Standing hip flexor stretch at step 5x 20 second holds bilateral  Standing row BTB 2x 10  Standing shoulder extension BTB 2x 10   01/11/22 Prone press up 2x 10  Prone on elbows 1 - 2 minutes x 4 incline changes with HOB Hip dips/ lumbar extension with hands on counter x 5 Prone quad stretch 3 x 20 second holds bilateral  Supine pelvic tilts a/p x 15  Seated pelvic tilts a/p x 15 Prone hip extension 1x 10   01/08/22 Review of HEP and goals  Prone: Prone on elbows x 3' Prone press up 2 x 5 Prone glute sets 5" hold x 10  Supine: Bridge x 10 LTR x 10 Abdominal bracing 5" hold x 10 Abdominal bracing with marching x 10 Scapular retraction 5" hold x 10  Standing: RTB  shoulder rows 2 x 10  Standing tall at wall x 30"   PATIENT EDUCATION:  Education details: 8/23:  goal review/update 7/31 updated HEP; Patient educated on exam findings, POC, scope of PT, HEP 01/24/22 - priformis stretching, sciatic nerve tension Person educated: Patient Education method: Explanation, Demonstration, and Handouts Education comprehension: verbalized understanding, returned demonstration, verbal cues required, and tactile cues required    HOME EXERCISE PROGRAM: Access Code: [email or web address] URL: [email or web address] 01/17/22 - Hip Flexor Stretch with Chair  - 1 x daily - 7 x weekly - 5 reps - 20 second hold  01/11/22- Prone Hip Extension  - 1 x daily - 7 x weekly - 1-2 sets - 10 reps - Prone Quadriceps Stretch with Strap  - 1 x daily - 7  x weekly - 3-5 reps - 20 second hold  Date: 01/08/2022 Prepared by: [facility]  Exercises - Static Prone on Elbows  - 2 x daily - 7 x weekly - 1 sets - 1 reps - 3 min hold - Prone Press Up  - 2 x daily - 7 x weekly - 1 sets - 5 reps - Prone Gluteal Sets  - 2 x daily - 7 x weekly - 1 sets - 10 reps - 5" hold - Supine Bridge  - 2 x daily - 7 x weekly - 3 sets - 10 reps - Supine Lower Trunk Rotation  - 2 x daily - 7 x weekly - 1 sets - 10 reps - Supine Transversus Abdominis Bracing - Hands on Stomach  - 2 x daily - 7 x weekly - 1 sets - 10 reps - Supine March  - 2 x daily - 7 x weekly - 1 sets - 10 reps - Supine Scapular Retraction  - 2 x daily - 7 x weekly - 1 sets - 10 reps   GOALS: Goals reviewed with patient? No  SHORT TERM GOALS: Target date: 01/11/2022   patient will be independent with initial HEP  Baseline: Goal status: MET  2.  Patient will demonstrate improved lumbar extension to neutral to improve endurance with standing, walking Baseline: eval -10, today -5 Goal status: PROGRESSING     LONG TERM GOALS: Target date: 01/25/2022  Patient will be independent with advanced HEP and self management strategies to improve quality of life and functional outcomes Baseline:  Goal status: PROGRESSING  2.  Patient will improve 5 x STS score from 24 sec to 18 sec to demonstrate improved functional mobility and increased lower extremity strength.  Baseline:  Goal status: MET  3.  Patient will report at least 50% improvement in overall symptoms and/or function to demonstrate improved functional mobility Baseline:  Goal status: PROGRESSING  4.  Patient will improve FOTO score to predicted value (65%) to demonstrate improved perceived functional mobility Baseline: 58 (8/23:  60%) Goal status: PROGRESSING  5.  Patient will increase distance on 2MWT to 280 ft to demonstrate improved functional gait for community ambulation  Baseline: 223 ft at eval, 408 today 8/23 Goal status:  MET  ASSESSMENT:  CLINICAL IMPRESSION: Progress note/recert completed this session.  Pt has made great progress towards goals meeting 1/2 short term and 2/5 long term goals.  LE strength and spinal mobility have improved as well as activity tolerance and subjective complaints.  Pt will continue to benefit from skilled therapy to build on lumbar stabilization, postural awareness and overall conditioning to meet remaining goals.      OBJECTIVE IMPAIRMENTS Abnormal gait, decreased activity tolerance, decreased  balance, decreased endurance, decreased knowledge of condition, decreased knowledge of use of DME, decreased mobility, difficulty walking, decreased ROM, decreased strength, decreased safety awareness, hypomobility, increased fascial restrictions, impaired perceived functional ability, impaired flexibility, and pain.   ACTIVITY LIMITATIONS carrying, lifting, bending, sitting, standing, squatting, sleeping, stairs, reach over head, locomotion level, and caring for others  PARTICIPATION LIMITATIONS: meal prep, cleaning, laundry, shopping, and community activity  PERSONAL FACTORS 1 comorbidity: MS  are also affecting patient's functional outcome.   REHAB POTENTIAL: Good  CLINICAL DECISION MAKING: Evolving/moderate complexity  EVALUATION COMPLEXITY: Moderate  PLAN: PT FREQUENCY: 2x/week  PT DURATION: 4 weeks  PLANNED INTERVENTIONS: Therapeutic exercises, Therapeutic activity, Neuromuscular re-education, Balance training, Gait training, Patient/Family education, Joint manipulation, Joint mobilization, Stair training, Orthotic/Fit training, DME instructions, Aquatic Therapy, Dry Needling, Electrical stimulation, Spinal manipulation, Spinal mobilization, Cryotherapy, Moist heat, Compression bandaging, scar mobilization, Splintting, Taping, Traction, Ultrasound, Ionotophoresis 39m/ml Dexamethasone, and Manual therapy   PLAN FOR NEXT SESSION:  progress general mobility and strengthening as  able; focus on Postural strengthening; lumbar extension   ATeena Irani PTA/CLT CBollingerPh: [phone] 4:44 PM, 01/31/22  9:13 AM, 02/01/22 Kyiah Canepa Small Lynch MPT [facility]  #[phone]

## 2022-02-01 NOTE — Addendum Note (Signed)
Addended byWilhemena Durie S on: 02/01/2022 09:15 AM   Modules accepted: Orders

## 2022-02-02 ENCOUNTER — Encounter (HOSPITAL_COMMUNITY): Payer: Self-pay

## 2022-02-02 ENCOUNTER — Ambulatory Visit (HOSPITAL_COMMUNITY): Payer: 59

## 2022-02-02 DIAGNOSIS — R262 Difficulty in walking, not elsewhere classified: Secondary | ICD-10-CM

## 2022-02-02 DIAGNOSIS — G35 Multiple sclerosis: Secondary | ICD-10-CM | POA: Diagnosis not present

## 2022-02-02 DIAGNOSIS — M545 Low back pain, unspecified: Secondary | ICD-10-CM

## 2022-02-02 DIAGNOSIS — M25661 Stiffness of right knee, not elsewhere classified: Secondary | ICD-10-CM | POA: Diagnosis not present

## 2022-02-02 DIAGNOSIS — M25561 Pain in right knee: Secondary | ICD-10-CM | POA: Diagnosis not present

## 2022-02-02 DIAGNOSIS — R2689 Other abnormalities of gait and mobility: Secondary | ICD-10-CM | POA: Diagnosis not present

## 2022-02-02 DIAGNOSIS — M6281 Muscle weakness (generalized): Secondary | ICD-10-CM

## 2022-02-02 NOTE — Therapy (Signed)
OUTPATIENT PHYSICAL THERAPY LUMBAR TREATMENT Patient Name: Melinda Moore MRN: [id number] DOB:01-Sep-1970, 51 y.o., female Today's Date: 02/02/2022   PCP: Sallee Lange, MD REFERRING PROVIDER: Sallee Lange, MD   PT End of Session - 02/02/22 1112     Visit Number 7    Number of Visits 14    Date for PT Re-Evaluation 02/23/22    Authorization Type UHC no ded, no VL, no auth required    Progress Note Due on Visit 16    PT Start Time 1035    PT Stop Time 1113    PT Time Calculation (min) 38 min    Activity Tolerance Patient tolerated treatment well    Behavior During Therapy WFL for tasks assessed/performed               Past Medical History:  Diagnosis Date   Anemia    Anxiety    Bell's palsy    Depression    DVT (deep venous thrombosis) ([place]) 09/02/2012   [place] on 02/13/2012 showed acute DVT in left calf veins and posterior tibial veins   Fibromyalgia    GERD (gastroesophageal reflux disease)    History of blood transfusion 2000   History of trigeminal neuralgia    HTN (hypertension)    patient denies was taking a blood pressure medication in early 2000 but it was migraines   Leukopenia    MS (multiple sclerosis) ([place]) 1997   Neuropathic pain    Peripheral edema    Port catheter in place 12/31/2012   Right bundle branch block    Past Surgical History:  Procedure Laterality Date   ABDOMINAL HYSTERECTOMY     CHOLECYSTECTOMY     COLONOSCOPY WITH PROPOFOL N/A 09/27/2021   Procedure: COLONOSCOPY WITH PROPOFOL;  Surgeon: Daneil Dolin, MD;  Location: [facility];  Service: Endoscopy;  Laterality: N/A;  8:00am, asa 2   ESOPHAGOGASTRODUODENOSCOPY  2011   Dr. Gala Romney. 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 Biopsy from the stomach revealed minimal chronic inactive inflammation but negative for H. pylori   LAPAROSCOPIC GASTRIC SLEEVE RESECTION N/A 04/17/2016   Procedure: LAPAROSCOPIC  GASTRIC SLEEVE RESECTION WITH UPPER ENDOSCOPY;  Surgeon: Excell Seltzer, MD;  Location: [facility];  Service: General;  Laterality: N/A;   PATELLA-FEMORAL ARTHROPLASTY Right 07/14/2019   Procedure: PATELLA-FEMORAL ARTHROPLASTY;  Surgeon: Marchia Bond, MD;  Location: [facility];  Service: Orthopedics;  Laterality: Right;   PORTACATH PLACEMENT     TUBAL LIGATION  2001   Patient Active Problem List   Diagnosis Date Noted   Gastroesophageal reflux disease 09/07/2021   Colon cancer screening 09/07/2021   Hx of hysterectomy 06/14/2021   Pneumonia due to COVID-19 virus 02/04/2020   Elevated transaminase level 02/04/2020   Patellofemoral arthritis of right knee 07/14/2019   History of DVT in adulthood 06/23/2019   Right leg pain 03/12/2017   Gait abnormality 03/12/2017   Radiculopathy due to lumbar intervertebral disc disorder 09/21/2016   Spondylosis without myelopathy or radiculopathy, lumbar region 09/21/2016   History of anxiety 09/08/2016   Primary osteoarthritis of right knee 08/30/2016   Primary insomnia 08/30/2016   Myofascial pain 08/30/2016   Sciatica, right side 12/12/2015   Right knee pain 12/12/2015   Morbid obesity ([place]) 08/12/2015   Lumbago 02/10/2014   Chronic anxiety 07/08/2013   Swelling of breast 04/08/2013   Chronic pain syndrome 03/11/2013   Port catheter in place 12/31/2012   Constipation 10/01/2012  Anemia 10/01/2012   Rectal bleeding 10/01/2012   Elevated alkaline phosphatase level 10/01/2012   MS (multiple sclerosis) ([place]) 09/02/2012   Anxiety 11/29/2011   Headache(784.0) 11/29/2011   ANEMIA 04/12/2010   OTHER DYSPHAGIA 04/12/2010    ONSET DATE: MS 1997  REFERRING DIAG: G35 (ICD-10-CM) - MS (multiple sclerosis) ([place]) M62.81 (ICD-10-CM) - Muscle weakness R27.0 (ICD-10-CM) - [place] M79.10 (ICD-10-CM) - Muscle pain   THERAPY DIAG:  Low back pain, unspecified back pain laterality, unspecified chronicity, unspecified whether sciatica present  Muscle weakness  (generalized)  Difficulty in walking, not elsewhere classified  Rationale for Evaluation and Treatment Rehabilitation  SUBJECTIVE:                                                                                                                                                                                              SUBJECTIVE STATEMENT: Pt stated her back is stiff today, no reports of pain.    PERTINENT HISTORY:  LBP;  TKA 2021 R ; then COVID shortly at the end of 2021  x 2 week in the hospital Also had some injections in the back per Dr. Brien Few that did help some  PAIN:  Are you having pain? No: NPRS scale: 0/10 Pain location: Right leg Pain description: constant throbbing Aggravating factors: lots of activity Relieving factors: medication  PRECAUTIONS: Fall  WEIGHT BEARING RESTRICTIONS No  FALLS: Has patient fallen in last 6 months? No  LIVING ENVIRONMENT: Lives with: lives with their family Lives in: House/apartment Stairs: Yes: External: 3 steps; none Has following equipment at home: Single point cane, Walker - 2 wheeled, shower chair, bed side commode, and Grab bars  PLOF: Independent  PATIENT GOALS  Get off of some of these pain meds  OBJECTIVE:   DIAGNOSTIC FINDINGS: none of lumbar spine  COGNITION: Overall cognitive status: Within functional limits for tasks assessed   SENSATION: feet from MS   POSTURE: flexed trunk        LUMBAR ROM:   Active  A/PROM  eval A/PROM 01/31/22  Flexion Fingers to mid shin Fingertip to ankle  Extension -10 degrees -5 degrees from neutral  Right lateral flexion To just below knee Fingertip below knee  Left lateral flexion To above knee Fingertip to kne  Right rotation Limited 70% Limited 40%  Left rotation Limted 70% Limited 40%    LOWER EXTREMITY MMT:    MMT Right Eval Right 01/31/22 Left Eval Left 01/31/22  Hip flexion 4 4+ 5 5  Hip extension 3- 3- 3- 3-  Hip abduction  3+  4  Hip adduction      Hip  internal rotation      Hip external rotation      Knee flexion      Knee extension 4+ _0 Ankle dorsiflexion 3+ 4+ 5 5  Ankle plantarflexion      Ankle inversion      Ankle eversion      (Blank rows = not tested)  BED MOBILITY:  Sit to supine Modified independence Supine to sit Modified independence Rolling to Right Modified independence Rolling to Left Modified independence   GAIT: Gait pattern: antalgic, decreased trunk rotation, trunk flexed, poor foot clearance- Right, and poor foot clearance- Left Distance walked: 223 Assistive device utilized: None Level of assistance: Modified independence and SBA Comments: fatigued at the end of walk has to sit before 2 min are up  FUNCTIONAL TESTs:  5 times sit to stand: 01/31/22: 17 sec without UE; 12/28/21: 24 sec without arms 2 minute walk test: 01/31/22:  feet without AD; 12/28/21: 223 ft without AD  PATIENT SURVEYS:  FOTO   01/31/22:  60%  functional status; 12/28/21: 58% functional status  TODAY'S TREATMENT:  02/02/22 3D hip excursion (weight shifting, rotate, squat front of chair, lumbar extension) Wall arch 6 reps BTB row 2x 10 given Blue theraband for HEP BTB shoulder extension 2x 10 given Blue theraband for HEP Pelvic tilt front of wall tactile cueing 10x 5" Paloff 2x 10 BTB   01/31/22 Progress Note completed Functional test measures FOTO MMT/AROM testing   01/24/22  Dynamic warm up 8 mins on nustep level 1 seat 12   There-Ex = Standing in // bars - Hip dips/ lumbar extension with hands on 2 bars to allow increased space open x 10 reps - Standing lumbar extensions hands on hips x 10 reps - Standing hip extensions x 10 reps B x 2 rounds - Standing lateral sways x 10 reps B  - Standing pelvic tilts for postural awareness x 10 reps - Standing hip abduction x 10 reps B x 2 rounds  - Standing heel raises with cue for tall posture x 10 reps  - Standing row BTB 2x 10  - Standing shoulder extension BTB 2x 1 - Standing  hip flexor stretch at step 5x 20 second holds bilateral  There-Ex = sitting in chair - Piriformis stretch hold heel to op knee cross body stretch x 10 sec hold B x 6 - Sitting hamstring stretch with ankle flossing x 20 reps B x 2 rounds  01/17/22 Hip dips/ lumbar extension with hands on counter x 10 Prone quad stretch 4 x 20 second holds bilateral  Prone hip extension 1x 10 bilateral  Supine pelvic tilts a/p 2x 20   Bridge 2x 10 5 second holds Standing hip flexor stretch at step 5x 20 second holds bilateral  Standing row BTB 2x 10  Standing shoulder extension BTB 2x 10   01/11/22 Prone press up 2x 10  Prone on elbows 1 - 2 minutes x 4 incline changes with HOB Hip dips/ lumbar extension with hands on counter x 5 Prone quad stretch 3 x 20 second holds bilateral  Supine pelvic tilts a/p x 15  Seated pelvic tilts a/p x 15 Prone hip extension 1x 10   01/08/22 Review of HEP and goals  Prone: Prone on elbows x 3' Prone press up 2 x 5 Prone glute sets 5" hold x 10  Supine: Bridge x 10 LTR x 10 Abdominal bracing 5" hold x 10 Abdominal bracing with marching x 10 Scapular retraction 5" hold  x 10  Standing: RTB  shoulder rows 2 x 10  Standing tall at wall x 30"   PATIENT EDUCATION:  Education details: 8/23:  goal review/update 7/31 updated HEP; Patient educated on exam findings, POC, scope of PT, HEP 01/24/22 - priformis stretching, sciatic nerve tension Person educated: Patient Education method: Explanation, Demonstration, and Handouts Education comprehension: verbalized understanding, returned demonstration, verbal cues required, and tactile cues required    HOME EXERCISE PROGRAM: Access Code: [email or web address] URL: [email or web address] 02/02/22: 3D hip excursion and BTB for rows and shoulder extension.    01/17/22 - Hip Flexor Stretch with Chair  - 1 x daily - 7 x weekly - 5 reps - 20 second hold  01/11/22- Prone Hip Extension  - 1 x daily - 7 x weekly - 1-2 sets - 10  reps - Prone Quadriceps Stretch with Strap  - 1 x daily - 7 x weekly - 3-5 reps - 20 second hold  Date: 01/08/2022 Prepared by: [facility]  Exercises - Static Prone on Elbows  - 2 x daily - 7 x weekly - 1 sets - 1 reps - 3 min hold - Prone Press Up  - 2 x daily - 7 x weekly - 1 sets - 5 reps - Prone Gluteal Sets  - 2 x daily - 7 x weekly - 1 sets - 10 reps - 5" hold - Supine Bridge  - 2 x daily - 7 x weekly - 3 sets - 10 reps - Supine Lower Trunk Rotation  - 2 x daily - 7 x weekly - 1 sets - 10 reps - Supine Transversus Abdominis Bracing - Hands on Stomach  - 2 x daily - 7 x weekly - 1 sets - 10 reps - Supine March  - 2 x daily - 7 x weekly - 1 sets - 10 reps - Supine Scapular Retraction  - 2 x daily - 7 x weekly - 1 sets - 10 reps   GOALS: Goals reviewed with patient? No  SHORT TERM GOALS: Target date: 01/11/2022   patient will be independent with initial HEP  Baseline: Goal status: MET  2.  Patient will demonstrate improved lumbar extension to neutral to improve endurance with standing, walking Baseline: eval -10, today -5 Goal status: PROGRESSING     LONG TERM GOALS: Target date: 01/25/2022  Patient will be independent with advanced HEP and self management strategies to improve quality of life and functional outcomes Baseline:  Goal status: PROGRESSING  2.  Patient will improve 5 x STS score from 24 sec to 18 sec to demonstrate improved functional mobility and increased lower extremity strength.  Baseline:  Goal status: MET  3.  Patient will report at least 50% improvement in overall symptoms and/or function to demonstrate improved functional mobility Baseline:  Goal status: PROGRESSING  4.  Patient will improve FOTO score to predicted value (65%) to demonstrate improved perceived functional mobility Baseline: 58 (8/23:  60%) Goal status: PROGRESSING  5.  Patient will increase distance on 2MWT to 280 ft to demonstrate improved functional gait for community  ambulation  Baseline: 223 ft at eval, 408 today 8/23 Goal status: MET  ASSESSMENT:  CLINICAL IMPRESSION: Progress note/recert completed this session.  Pt has made great progress towards goals meeting 1/2 short term and 2/5 long term goals.  LE strength and spinal mobility have improved as well as activity tolerance and subjective complaints.  Pt will continue to benefit from skilled therapy to build on lumbar stabilization, postural awareness  and overall conditioning to meet remaining goals.    Add excursion exercises and wall arch for mobility and gluteal strengthening that was tolerated.  Session focus with LE strengthening and postural strengthening.  Tactile and verbal cueing to improve posterior pelvic tilt and reduce anterior shoulder movements.  Periodic short duration seated rest breaks required due to fatigue.  Reports of improved mobility at EOS.     OBJECTIVE IMPAIRMENTS Abnormal gait, decreased activity tolerance, decreased balance, decreased endurance, decreased knowledge of condition, decreased knowledge of use of DME, decreased mobility, difficulty walking, decreased ROM, decreased strength, decreased safety awareness, hypomobility, increased fascial restrictions, impaired perceived functional ability, impaired flexibility, and pain.   ACTIVITY LIMITATIONS carrying, lifting, bending, sitting, standing, squatting, sleeping, stairs, reach over head, locomotion level, and caring for others  PARTICIPATION LIMITATIONS: meal prep, cleaning, laundry, shopping, and community activity  PERSONAL FACTORS 1 comorbidity: MS  are also affecting patient's functional outcome.   REHAB POTENTIAL: Good  CLINICAL DECISION MAKING: Evolving/moderate complexity  EVALUATION COMPLEXITY: Moderate  PLAN: PT FREQUENCY: 2x/week  PT DURATION: 4 weeks  PLANNED INTERVENTIONS: Therapeutic exercises, Therapeutic activity, Neuromuscular re-education, Balance training, Gait training, Patient/Family  education, Joint manipulation, Joint mobilization, Stair training, Orthotic/Fit training, DME instructions, Aquatic Therapy, Dry Needling, Electrical stimulation, Spinal manipulation, Spinal mobilization, Cryotherapy, Moist heat, Compression bandaging, scar mobilization, Splintting, Taping, Traction, Ultrasound, Ionotophoresis 54m/ml Dexamethasone, and Manual therapy   PLAN FOR NEXT SESSION:  progress general mobility and strengthening as able; focus on Postural strengthening; lumbar extension     12:56 PM, 02/02/22

## 2022-02-06 ENCOUNTER — Encounter (HOSPITAL_COMMUNITY)
Admission: RE | Admit: 2022-02-06 | Discharge: 2022-02-06 | Disposition: A | Discharge status: Home / Self Care | Payer: 59 | Source: Ambulatory Visit | Attending: Internal Medicine | Admitting: Internal Medicine

## 2022-02-06 ENCOUNTER — Encounter (HOSPITAL_COMMUNITY): Payer: Self-pay

## 2022-02-06 ENCOUNTER — Other Ambulatory Visit: Payer: Self-pay

## 2022-02-06 NOTE — Pre-Procedure Instructions (Signed)
Attempted pre-op phonecall. Left VM for her to call us back. 

## 2022-02-07 ENCOUNTER — Encounter (HOSPITAL_COMMUNITY): Payer: 59 | Admitting: Physical Therapy

## 2022-02-08 ENCOUNTER — Telehealth: Payer: Self-pay | Admitting: *Deleted

## 2022-02-08 ENCOUNTER — Encounter (HOSPITAL_COMMUNITY): Payer: Self-pay | Admitting: Internal Medicine

## 2022-02-08 ENCOUNTER — Ambulatory Visit (HOSPITAL_COMMUNITY)
Admission: RE | Admit: 2022-02-08 | Discharge: 2022-02-08 | Disposition: A | Discharge status: Home / Self Care | Payer: 59 | Source: Ambulatory Visit | Attending: Internal Medicine | Admitting: Internal Medicine

## 2022-02-08 ENCOUNTER — Other Ambulatory Visit: Payer: Self-pay | Admitting: Family Medicine

## 2022-02-08 ENCOUNTER — Ambulatory Visit (HOSPITAL_COMMUNITY): Payer: 59 | Admitting: Anesthesiology

## 2022-02-08 ENCOUNTER — Other Ambulatory Visit: Payer: Self-pay

## 2022-02-08 ENCOUNTER — Ambulatory Visit (HOSPITAL_BASED_OUTPATIENT_CLINIC_OR_DEPARTMENT_OTHER): Payer: 59 | Admitting: Anesthesiology

## 2022-02-08 ENCOUNTER — Encounter (HOSPITAL_COMMUNITY)
Admission: RE | Disposition: A | Discharge status: Home / Self Care | Payer: Self-pay | Source: Ambulatory Visit | Attending: Internal Medicine

## 2022-02-08 DIAGNOSIS — K219 Gastro-esophageal reflux disease without esophagitis: Secondary | ICD-10-CM | POA: Diagnosis not present

## 2022-02-08 DIAGNOSIS — Z87891 Personal history of nicotine dependence: Secondary | ICD-10-CM

## 2022-02-08 DIAGNOSIS — Z1212 Encounter for screening for malignant neoplasm of rectum: Secondary | ICD-10-CM | POA: Diagnosis not present

## 2022-02-08 DIAGNOSIS — Z1211 Encounter for screening for malignant neoplasm of colon: Secondary | ICD-10-CM | POA: Insufficient documentation

## 2022-02-08 DIAGNOSIS — I10 Essential (primary) hypertension: Secondary | ICD-10-CM

## 2022-02-08 DIAGNOSIS — Z8371 Family history of colonic polyps: Secondary | ICD-10-CM

## 2022-02-08 DIAGNOSIS — F418 Other specified anxiety disorders: Secondary | ICD-10-CM | POA: Diagnosis not present

## 2022-02-08 DIAGNOSIS — F32A Depression, unspecified: Secondary | ICD-10-CM | POA: Insufficient documentation

## 2022-02-08 DIAGNOSIS — Z538 Procedure and treatment not carried out for other reasons: Secondary | ICD-10-CM | POA: Insufficient documentation

## 2022-02-08 DIAGNOSIS — Z6837 Body mass index (BMI) 37.0-37.9, adult: Secondary | ICD-10-CM | POA: Diagnosis not present

## 2022-02-08 DIAGNOSIS — Z800 Family history of malignant neoplasm of digestive organs: Secondary | ICD-10-CM | POA: Insufficient documentation

## 2022-02-08 DIAGNOSIS — F419 Anxiety disorder, unspecified: Secondary | ICD-10-CM | POA: Diagnosis not present

## 2022-02-08 HISTORY — PX: FLEXIBLE SIGMOIDOSCOPY: SHX5431

## 2022-02-08 SURGERY — SIGMOIDOSCOPY, FLEXIBLE
Anesthesia: General

## 2022-02-08 MED ORDER — PROPOFOL 500 MG/50ML IV EMUL
INTRAVENOUS | Status: DC | PRN
  Administered 2022-02-08: 150 ug/kg/min via INTRAVENOUS

## 2022-02-08 MED ORDER — PROPOFOL 10 MG/ML IV BOLUS
INTRAVENOUS | Status: DC | PRN
  Administered 2022-02-08: 100 mg via INTRAVENOUS

## 2022-02-08 MED ORDER — LACTATED RINGERS IV SOLN: INTRAVENOUS | Status: DC | PRN

## 2022-02-08 MED ORDER — LACTATED RINGERS IV SOLN: INTRAVENOUS | Status: DC

## 2022-02-08 NOTE — Op Note (Signed)
[facility] Patient Name: Melinda Moore Procedure Date: 02/08/2022 7:17 AM MRN: [id number] Date of Birth: 1970-08-28 Attending MD: Gennette Pac , MD CSN: [id number] Age: 51 Admit Type: Outpatient Procedure:                FS/(attempted colonoscopy ) Indications:              Colon cancer screening in patient at increased                            risk: Family history of 1st-degree relative with                            colon polyps Providers:                Gennette Pac, MD, Edrick Kins, RN, Crystal                            Page Referring MD:              Medicines:                Propofol per Anesthesia Complications:            No immediate complications. Estimated Blood Loss:     Estimated blood loss: none. Procedure:                Pre-Anesthesia Assessment:                           - Prior to the procedure, a History and Physical                            was performed, and patient medications and                            allergies were reviewed. The patient's tolerance of                            previous anesthesia was also reviewed. The risks                            and benefits of the procedure and the sedation                            options and risks were discussed with the patient.                            All questions were answered, and informed consent                            was obtained. Prior Anticoagulants: The patient has                            taken no previous anticoagulant or antiplatelet                            agents.  ASA Grade Assessment: III - A patient with                            severe systemic disease. After reviewing the risks                            and benefits, the patient was deemed in                            satisfactory condition to undergo the procedure.                           After obtaining informed consent, the colonoscope                            was passed under direct vision.  Throughout the                            procedure, the patient's blood pressure, pulse, and                            oxygen saturations were monitored continuously. The                            [id number]) scope was introduced through the                            anus with the intention of advancing to the cecum.                            The scope was advanced to the rectum before the                            procedure was aborted. Medications were given. The                            colonoscopy was aborted due to inadequate bowel                            prep. The quality of the bowel preparation was                            inadequate. Scope In: 7:40:51 AM Scope Out: 7:41:01 AM Total Procedure Duration: 0 hours 0 minutes 10 seconds  Findings:      The perianal and digital rectal examinations were normal. Scopic       findings: Formed stool in the rectum precluded performance of procedure. Impression:               - The procedure was aborted due to inadequate bowel                            prep.                           -  Preparation of the colon was inadequate.                           - No specimens collected. Moderate Sedation:      Moderate (conscious) sedation was personally administered by an       anesthesia professional. The following parameters were monitored: oxygen       saturation, heart rate, blood pressure, respiratory rate, EKG, adequacy       of pulmonary ventilation, and response to care. Recommendation:           - Patient has a contact number available for                            emergencies. The signs and symptoms of potential                            delayed complications were discussed with the                            patient. Return to normal activities tomorrow.                            Written discharge instructions were provided to the                            patient.                           - Advance diet as  tolerated.                           - Continue present medications.                           - Return to my office at appointment to be                            scheduled. Procedure Code(s):        --- Professional ---                           [phone], 73, Colonoscopy, flexible; diagnostic,                            including collection of specimen(s) by brushing or                            washing, when performed (separate procedure) Diagnosis Code(s):        --- Professional ---                           Z83.71, Family history of colonic polyps CPT copyright 2019 American Medical [employer]. All rights reserved. The codes documented in this report are preliminary and upon coder review may  be revised to meet current compliance requirements. [name] [name], MD [name], MD 02/08/2022 9:44:40 AM This report has been signed electronically. Number  of Addenda: 0

## 2022-02-08 NOTE — Anesthesia Preprocedure Evaluation (Signed)
Anesthesia Evaluation  Patient identified by MRN, date of birth, ID band Patient awake    Reviewed: Allergy & Precautions, H&P , NPO status , Patient's Chart, lab work & pertinent test results, reviewed documented beta blocker date and time   Airway Mallampati: II  TM Distance: >3 FB Neck ROM: full    Dental no notable dental hx.    Pulmonary neg pulmonary ROS, former smoker,    Pulmonary exam normal breath sounds clear to auscultation       Cardiovascular Exercise Tolerance: Good hypertension, negative cardio ROS   Rhythm:regular Rate:Normal     Neuro/Psych  Headaches, PSYCHIATRIC DISORDERS Anxiety Depression  Neuromuscular disease    GI/Hepatic Neg liver ROS, GERD  Medicated,  Endo/Other  Morbid obesity  Renal/GU negative Renal ROS  negative genitourinary   Musculoskeletal   Abdominal   Peds  Hematology  (+) Blood dyscrasia, anemia ,   Anesthesia Other Findings   Reproductive/Obstetrics negative OB ROS                             Anesthesia Physical Anesthesia Plan  ASA: 3  Anesthesia Plan: General   Post-op Pain Management:    Induction:   PONV Risk Score and Plan: Propofol infusion  Airway Management Planned:   Additional Equipment:   Intra-op Plan:   Post-operative Plan:   Informed Consent: I have reviewed the patients History and Physical, chart, labs and discussed the procedure including the risks, benefits and alternatives for the proposed anesthesia with the patient or authorized representative who has indicated his/her understanding and acceptance.     Dental Advisory Given  Plan Discussed with: CRNA  Anesthesia Plan Comments:         Anesthesia Quick Evaluation

## 2022-02-08 NOTE — H&P (Signed)
_0 @   Primary Care Physician:  Kathyrn Drown, MD Primary Gastroenterologist:  Dr. Gala Romney  Pre-Procedure History & Physical: HPI:  Melinda Moore is a 51 y.o. female is here for a screening colonoscopy.  Multiple family members with colon polyps.  Prior attempted first ever colonoscopy thwarted by inadequate preparation.  Past Medical History:  Diagnosis Date   Anemia    Anxiety    Bell's palsy    Depression    DVT (deep venous thrombosis) ([place]) 09/02/2012   [place] on 02/13/2012 showed acute DVT in left calf veins and posterior tibial veins   Fibromyalgia    GERD (gastroesophageal reflux disease)    History of blood transfusion 2000   History of trigeminal neuralgia    HTN (hypertension)    patient denies was taking a blood pressure medication in early 2000 but it was migraines   Leukopenia    MS (multiple sclerosis) ([place]) 1997   Neuropathic pain    Peripheral edema    Port catheter in place 12/31/2012   Right bundle branch block     Past Surgical History:  Procedure Laterality Date   ABDOMINAL HYSTERECTOMY     CHOLECYSTECTOMY     COLONOSCOPY WITH PROPOFOL N/A 09/27/2021   Procedure: COLONOSCOPY WITH PROPOFOL;  Surgeon: Daneil Dolin, MD;  Location: [facility];  Service: Endoscopy;  Laterality: N/A;  8:00am, asa 2   ESOPHAGOGASTRODUODENOSCOPY  2011   Dr. Gala Romney. 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 Biopsy from the stomach revealed minimal chronic inactive inflammation but negative for H. pylori   LAPAROSCOPIC GASTRIC SLEEVE RESECTION N/A 04/17/2016   Procedure: LAPAROSCOPIC GASTRIC SLEEVE RESECTION WITH UPPER ENDOSCOPY;  Surgeon: Excell Seltzer, MD;  Location: [facility];  Service: General;  Laterality: N/A;   PATELLA-FEMORAL ARTHROPLASTY Right 07/14/2019   Procedure: PATELLA-FEMORAL ARTHROPLASTY;  Surgeon: Marchia Bond, MD;  Location: [facility];  Service: Orthopedics;  Laterality:  Right;   PORTACATH PLACEMENT     TUBAL LIGATION  2001    Prior to Admission medications   Medication Sig Start Date End Date Taking? Authorizing Provider  ascorbic acid (VITAMIN C) 500 MG tablet Take 1 tablet (500 mg total) by mouth daily. 02/15/20  Yes Johnson, Clanford L, MD  Bioflavonoid Products (BIOFLEX) TABS Take 2 tablets by mouth daily.   Yes [provider]  buPROPion (WELLBUTRIN SR) 150 MG 12 hr tablet Take 1 tablet (150 mg total) by mouth 2 (two) times daily. 06/14/21  Yes Ameduite, Trenton Gammon, NP  carbamazepine (TEGRETOL XR) 200 MG 12 hr tablet Take 1 tablet (200 mg total) by mouth 3 (three) times daily as needed. 06/14/21  Yes Ameduite, Trenton Gammon, NP  Cholecalciferol (VITAMIN D3) 125 MCG (5000 UT) TABS Take 20,000 Units by mouth 2 (two) times daily.   Yes [provider]  gabapentin (NEURONTIN) 300 MG capsule Take 1,200 mg by mouth 3 (three) times daily. 02/04/21  Yes [provider]  linaclotide Rolan Lipa) 145 MCG CAPS capsule Take 1 capsule (145 mcg total) by mouth daily before breakfast. 10/31/21  Yes Mahon, Courtney L, NP  methylPREDNISolone sodium succinate (SOLU-MEDROL) 1000 MG injection Inject 1,000 mg into the vein See admin instructions. 1,000 mg IV q day for 3 days 09/11/21  Yes Luking, Elayne Snare, MD  Multiple Vitamin (MULTIVITAMIN WITH MINERALS) TABS tablet Take 1 tablet by mouth daily. ALIVE   Yes [provider]  Multiple Vitamins-Minerals (EMERGEN-C IMMUNE) PACK Take 1 packet  by mouth once a week.   Yes [provider]  Ofatumumab (KESIMPTA) 20 MG/0.4ML SOAJ Inject into the skin. 10/09/21  Yes [provider]  ondansetron (ZOFRAN) 8 MG tablet Take 1 tablet (8 mg total) by mouth 2 (two) times daily as needed for nausea or vomiting. 10/31/21  Yes Sherron Monday, NP  pantoprazole (PROTONIX) 40 MG tablet Take 1 tablet (40 mg total) by mouth 2 (two) times daily before a meal. 09/07/21  Yes Jodi Mourning, Kristen S, PA-C  potassium chloride SA  (KLOR-CON M) 20 MEQ tablet TAKE ONE TABLET (20MEQ TOTAL) BY MOUTH DAILY 11/14/21  Yes Kathyrn Drown, MD  sertraline (ZOLOFT) 50 MG tablet TAKE ONE-HALF TABLET BY MOUTH TWICE A DAY 01/23/22  Yes Ameduite, Trenton Gammon, NP  Sod Picosulfate-Mag Ox-Cit Acd (CLENPIQ) 10-3.5-12 MG-GM -GM/175ML SOLN Take 1 kit by mouth as directed. 12/26/21  Yes Kiely Cousar, Cristopher Estimable, MD  torsemide (DEMADEX) 20 MG tablet Take 1/2 to 1  tablet po q am prn pedal edema Patient taking differently: Take 10-20 mg by mouth daily as needed (edema). Take 1/2 to 1  tablet po q am prn pedal edema 05/24/21  Yes Luking, Scott A, MD  zinc sulfate 220 (50 Zn) MG capsule Take 1 capsule (220 mg total) by mouth daily. 02/15/20  Yes Johnson, Clanford L, MD  HYDROmorphone (DILAUDID) 4 MG tablet Take 1/2 to 1 tablet qid prn , maximum use per day is 3-1/2 tablets 11/14/21   Kathyrn Drown, MD  HYDROmorphone (DILAUDID) 4 MG tablet Take 1/2 to 1 tablet po qid prn, max use per day is 3-1/2 tablets 11/14/21   Luking, Elayne Snare, MD  HYDROmorphone (DILAUDID) 4 MG tablet Take 1/2 to 1 tablet po qid prn, max use per day is 3-1/2 tablets. 11/14/21   Kathyrn Drown, MD  naloxone [facility]) nasal spray 4 mg/0.1 mL Use as directed if accidental overdose 01/25/20   Kathyrn Drown, MD    Allergies as of 11/02/2021 - Review Complete 10/31/2021  Allergen Reaction Noted   Ambien [zolpidem tartrate]  02/08/2014   Tape Itching and Rash 03/26/2011    Family History  Problem Relation Age of Onset   Healthy Mother    Heart disease Father    Hyperlipidemia Father    Congestive Heart Failure Father    Hypertension Sister    Colon polyps Sister    Colon polyps Sister    Colon polyps Sister    Colon polyps Sister    Cirrhosis Brother        etoh   Healthy Daughter    Healthy Daughter    Colon cancer Paternal Uncle     Social History   Socioeconomic History   Marital status: Married    Spouse name: Remo Lipps   Number of children: 2   Years of education: some college    Highest education level: Not on file  Occupational History   Occupation: disabled  Tobacco Use   Smoking status: Former    Packs/day: 0.25    Types: Cigarettes    Quit date: 1990    Years since quitting: 33.6   Smokeless tobacco: Never   Tobacco comments:    4 a day, quit in 1990  Vaping Use   Vaping Use: Never used  Substance and Sexual Activity   Alcohol use: No   Drug use: No   Sexual activity: Not on file  Other Topics Concern   Not on file  Social History Narrative   Lives  at home with husband and 2 daughters. 3 grand daughters.    Right handed   Takes 1/2 caffeine pill per day   Social Determinants of Health   Financial Resource Strain: Low Risk  (06/13/2021)   Overall Financial Resource Strain (CARDIA)    Difficulty of Paying Living Expenses: Not hard at all  Food Insecurity: No Food Insecurity (06/13/2021)   Hunger Vital Sign    Worried About Running Out of Food in the Last Year: Never true    Ran Out of Food in the Last Year: Never true  Transportation Needs: No Transportation Needs (06/13/2021)   PRAPARE - Hydrologist (Medical): No    Lack of Transportation (Non-Medical): No  Physical Activity: Inactive (06/13/2021)   Exercise Vital Sign    Days of Exercise per Week: 0 days    Minutes of Exercise per Session: 0 min  Stress: No Stress Concern Present (06/13/2021)   Coggon of Stress : Not at all  Social Connections: [place] (06/13/2021)   Social Connection and Isolation Panel [NHANES]    Frequency of Communication with Friends and Family: More than three times a week    Frequency of Social Gatherings with Friends and Family: More than three times a week    Attends Religious Services: 1 to 4 times per year    Active Member of [employer] or Organizations: No    Attends Archivist Meetings: 1 to 4 times per year    Marital Status: Married   Human resources officer Violence: Not At Risk (06/13/2021)   Humiliation, Afraid, Rape, and Kick questionnaire    Fear of Current or Ex-Partner: No    Emotionally Abused: No    Physically Abused: No    Sexually Abused: No    Review of Systems: See HPI, otherwise negative ROS  Physical Exam: BP 118/81 (BP Location: Right Arm)   Pulse 96   Temp 98.7 F (37.1 C)   Resp 17   Ht _0  (1.753 m)   Wt 116 kg   SpO2 97%   BMI 37.77 kg/m  General:   Alert,  Well-developed, well-nourished, pleasant and cooperative in NAD Lungs:  Clear throughout to auscultation.   No wheezes, crackles, or rhonchi. No acute distress. Heart:  Regular rate and rhythm; no murmurs, clicks, rubs,  or gallops. Abdomen:  Soft, nontender and nondistended. No masses, hepatosplenomegaly or hernias noted. Normal bowel sounds, without guarding, and without rebound.    Impression/Plan: Melinda Moore is now here to undergo a screening colonoscopy.  High risk screening examination  Risks, benefits, limitations, imponderables and alternatives regarding colonoscopy have been reviewed with the patient. Questions have been answered. All parties agreeable.     Notice:  This dictation was prepared with Dragon dictation along with smaller phrase technology. Any transcriptional errors that result from this process are unintentional and may not be corrected upon review.

## 2022-02-08 NOTE — Discharge Instructions (Signed)
  Sigmoidoscopy Discharge Instructions  Read the instructions outlined below and refer to this sheet in the next few weeks. These discharge instructions provide you with general information on caring for yourself after you leave the hospital. Your doctor may also give you specific instructions. While your treatment has been planned according to the most current medical practices available, unavoidable complications occasionally occur. If you have any problems or questions after discharge, call Dr. Jena Gauss at [phone]. ACTIVITY You may resume your regular activity, but move at a slower pace for the next 24 hours.  Take frequent rest periods for the next 24 hours.  Walking will help get rid of the air and reduce the bloated feeling in your belly (abdomen).  No driving for 24 hours (because of the medicine (anesthesia) used during the test).   Do not sign any important legal documents or operate any machinery for 24 hours (because of the anesthesia used during the test).  NUTRITION Drink plenty of fluids.  You may resume your normal diet as instructed by your doctor.  Begin with a light meal and progress to your normal diet. Heavy or fried foods are harder to digest and may make you feel sick to your stomach (nauseated).  Avoid alcoholic beverages for 24 hours or as instructed.  MEDICATIONS You may resume your normal medications unless your doctor tells you otherwise.  WHAT YOU CAN EXPECT TODAY Some feelings of bloating in the abdomen.  Passage of more gas than usual.  Spotting of blood in your stool or on the toilet paper.  IF YOU HAD POLYPS REMOVED DURING THE COLONOSCOPY: No aspirin products for 7 days or as instructed.  No alcohol for 7 days or as instructed.  Eat a soft diet for the next 24 hours.  FINDING OUT THE RESULTS OF YOUR TEST Not all test results are available during your visit. If your test results are not back during the visit, make an appointment with your caregiver to find out  the results. Do not assume everything is normal if you have not heard from your caregiver or the medical facility. It is important for you to follow up on all of your test results.  SEEK IMMEDIATE MEDICAL ATTENTION IF: You have more than a spotting of blood in your stool.  Your belly is swollen (abdominal distention).  You are nauseated or vomiting.  You have a temperature over 101.  You have abdominal pain or discomfort that is severe or gets worse throughout the day.    Your prep unfortunately was again poor.  Could not complete the examination.  Office visit with an app in 3 months  At patient request, I called Meriam Chojnowski at [phone] -reviewed findings

## 2022-02-08 NOTE — Transfer of Care (Signed)
Immediate Anesthesia Transfer of Care Note  Patient: Melinda Moore  Procedure(s) Performed: FLEXIBLE SIGMOIDOSCOPY  Patient Location: PACU  Anesthesia Type:General  Level of Consciousness: awake and alert   Airway & Oxygen Therapy: Patient Spontanous Breathing  Post-op Assessment: Report given to RN and Post -op Vital signs reviewed and stable  Post vital signs: Reviewed and stable  Last Vitals:  Vitals Value Taken Time  BP 87/54 02/08/22 0745  Temp 98   Pulse 102 02/08/22 0746  Resp 14 02/08/22 0746  SpO2 100 % 02/08/22 0746  Vitals shown include unvalidated device data.  Last Pain:  Vitals:   02/08/22 0736  PainSc: 0-No pain         Complications: No notable events documented.

## 2022-02-08 NOTE — Telephone Encounter (Signed)
Per Dr. Jena Gauss, still had formed on colonoscopy today. Pt stated she followed instructions. Patient needs OV with any APP in 2-3 months

## 2022-02-08 NOTE — Anesthesia Postprocedure Evaluation (Signed)
Anesthesia Post Note  Patient: Melinda Moore  Procedure(s) Performed: FLEXIBLE SIGMOIDOSCOPY  Patient location during evaluation: Phase II Anesthesia Type: General Level of consciousness: awake Pain management: pain level controlled Vital Signs Assessment: post-procedure vital signs reviewed and stable Respiratory status: spontaneous breathing and respiratory function stable Cardiovascular status: blood pressure returned to baseline and stable Postop Assessment: no headache and no apparent nausea or vomiting Anesthetic complications: no Comments: Late entry   No notable events documented.   Last Vitals:  Vitals:   02/08/22 0758 02/08/22 0805  BP: 120/81 114/82  Pulse: 90 89  Resp: 16 12  Temp:  36.7 C  SpO2: 97% 100%    Last Pain:  Vitals:   02/08/22 0806  TempSrc:   PainSc: 0-No pain                 Windell Norfolk

## 2022-02-13 ENCOUNTER — Encounter (HOSPITAL_COMMUNITY): Payer: 59

## 2022-02-14 ENCOUNTER — Ambulatory Visit (INDEPENDENT_AMBULATORY_CARE_PROVIDER_SITE_OTHER): Payer: 59 | Admitting: Family Medicine

## 2022-02-14 ENCOUNTER — Encounter (HOSPITAL_COMMUNITY): Payer: 59

## 2022-02-14 VITALS — BP 121/82 | HR 84 | Temp 97.3°F | Ht 69.0 in | Wt 248.0 lb

## 2022-02-14 DIAGNOSIS — Z23 Encounter for immunization: Secondary | ICD-10-CM | POA: Diagnosis not present

## 2022-02-14 DIAGNOSIS — F419 Anxiety disorder, unspecified: Secondary | ICD-10-CM

## 2022-02-14 DIAGNOSIS — E785 Hyperlipidemia, unspecified: Secondary | ICD-10-CM | POA: Diagnosis not present

## 2022-02-14 DIAGNOSIS — G35 Multiple sclerosis: Secondary | ICD-10-CM | POA: Diagnosis not present

## 2022-02-14 DIAGNOSIS — Z79891 Long term (current) use of opiate analgesic: Secondary | ICD-10-CM

## 2022-02-14 DIAGNOSIS — R110 Nausea: Secondary | ICD-10-CM

## 2022-02-14 DIAGNOSIS — Z79899 Other long term (current) drug therapy: Secondary | ICD-10-CM | POA: Diagnosis not present

## 2022-02-14 MED ORDER — HYDROMORPHONE HCL 4 MG PO TABS: ORAL_TABLET | ORAL | 0 refills | Status: DC

## 2022-02-14 MED ORDER — BUPROPION HCL ER (SR) 150 MG PO TB12: 150.0000 mg | ORAL_TABLET | Freq: Two times a day (BID) | ORAL | 1 refills | Status: DC

## 2022-02-14 MED ORDER — ONDANSETRON HCL 8 MG PO TABS: 8.0000 mg | ORAL_TABLET | Freq: Two times a day (BID) | ORAL | 4 refills | Status: DC | PRN

## 2022-02-14 MED ORDER — TORSEMIDE 20 MG PO TABS
ORAL_TABLET | ORAL | 5 refills | Status: DC
Start: 2022-02-14 — End: 2022-11-07

## 2022-02-14 MED ORDER — POTASSIUM CHLORIDE CRYS ER 20 MEQ PO TBCR
EXTENDED_RELEASE_TABLET | ORAL | 6 refills | Status: DC
Start: 2022-02-14 — End: 2022-11-07

## 2022-02-14 NOTE — Patient Instructions (Signed)
Narcotic medication treatment agreement-educational material and consent form. Your health problem-because you are having problems with pain you are being prescribed narcotic medication to help control your pain. The purpose of narcotic medication-narcotics are a type of drug that should help you with your pain and let you be more active in your daily life. It is not expected that your pain will go away completely. There are risks associated with these drugs and you can also have side effects. It is important for you to be honest with your doctor about your pain and the dose of medication you are taking. It is also important to be honest with your doctor about any potential problems or side effects with the medications that you are having. Risk and common problems-narcotic use has been associated with the following issues Addiction- there is a chance that you could become addicted to narcotic drugs. This means that you once the drug and will try very hard to get it, even if it causes you harm or other problems in your life. This chance is greater in people who are young, have mental illness, have been addicted to any drug in the past, or have a close relative that has been addicted to a drug in the past. Your doctor may tell you that you need tests or should see other health providers to help you avoid addiction. Allergic reaction - all kinds of allergic reactions can happen. You could have a minor reaction such as a rash or severe reaction such as swelling of your tongue or throat. A severe reaction as a medical emergency that can cause death. Incomplete relief of pain - narcotic medications do not take away all of your pain. Your doctor will work with you to try to optimize treatment but it is not reasonable to expect complete relief of pain. Low testosterone levels in men - narcotic drugs may cause the levels of the hormone testosterone to drop in men. This could change your mood and energy level. It may  also lessen the desire to have sex. Testosterone supplementation in this situation is not recommended. Physical dependence- you may not feel well if your dose is suddenly stopped. Some common symptoms of withdraw are runny nose, excessive yawning, goosebumps, nausea with stomach pains, diarrhea, body aches, and increased irritability. Side effects- there are many side effects of narcotic drugs. Constipation, nausea, vomiting, itching, dizziness are all potential side effects. Slowed breathing- excessive doses of narcotics can slow your breathing. Do not use other drugs or drink alcohol while taking narcotic drugs. This can cause accidental death. Follow directions on how the medication is prescribed. If you feel you are having problems then notify your doctor. Slowed reaction time- you may feel sleepy and be slow to react. If this happens, then you should not drive, use heavy machinery or guns, or be at unsafe heights, or be caring for someone else. Tolerance- your body could become use to the dose of narcotic drugs that your doctor tells you to take, and you may not get the same relief of pain that you had before. A higher dose may not help and could cause potential side effects. Increased risk of accidental death can occur due to the narcotic medication or side effects. If you are having any of the problems listed above you need to discuss these with your physician.  Other choices- you do not have to take narcotics. The decision to take narcotics for pain his ureters. There are other choices that you may choose.   There are several alternatives that may be helpful. These can be done in place of narcotics or in some cases along with narcotics. - Anti-inflammatories, antidepressants, and seizure medications can be helpful -Physical therapy, wearing a brace, surgical referral, home exercise regimens, could potentially help -Referral to a specialist-you may wish to see a specialist who specializes in pain  management -You can choose to do nothing and live with the pain you have -Your doctor will discuss with you your choices but the decision his ureters. How well any other treatment works will depend on your specific health problem. More facts-there may be local, steak, or federal laws that your doctor must follow with prescribing narcotic drugs. It is not clear if narcotic painkillers are good for you to take for a long period of time. You should discuss with your doctor often about the good and bad effects that these drugs may have on you.  You should not take narcotic drugs if you are pregnant. If you become pregnant notify your doctor right away. Narcotic drugs can raise a chance of having a miscarriage or having a baby born with a birth defect. Your baby can also be born addicted to the drug. Should you become pregnant you will have to discontinue narcotic use. Treatment agreement - by signing the consent form you agree that you understand the rules for taking narcotic drugs. If you do not follow these rules, then your doctor may refer you to a specialist, no longer prescribe pain medications for you, and release you/terminate you from his or her care. Drug safety-you must lock your drugs in a safe place. They must be kept away from children. We will also were review with you the right way to get rid of any extra drugs.  You may not sell, share, or let other people use your drugs. This is a crime and can cause overdoses. You may be asked to come to our office between scheduled appointments for random pill counts. Failure to comply with this will result in dismissal. Instructions for taking narcotic drugs-you are only to take pain medications that is prescribed by our office. Do not combine your pain medication with other physician narcotic pain medications or other peoples pain medications. Do not stop taking your narcotic suddenly.  Do not drive after a new pain drug is started or after a dose is  increased until you are sure it does not make you sleepy or confused. Do not try to cut or crush your drug unless told to do so. This could cause death. Your drug will be stopped if it is not helping enough or if it is showing signs of harm to you. You must tell your doctor about any new drugs or health problems. Your drug may not work well or may work differently if you have certain health problems.  You must tell your doctor about problems you have with any drugs prescribed or illegal. If you feel you're having an addiction problem with prescribed or illegal drugs you will discuss this with your doctor in order to be referred for treatment.  Your doctor reserves the right to limit other medications that can interact with pain medications. Prescriptions and refills- your narcotic drugs will be prescribed by our office only. You may not ask for pain drugs from any other doctors including emergency room doctors. Our office will decide how many refills you will be given and how often he will need to be seen in our office in order to get   them area at the very least every 3 month appointments are required. Never try to change a prescription. If you do this then it will be reported to the police. You will also be terminated from the practice. Your prescription will not be replaced if lost, stolen, or destroyed by accident. Patients on regular narcotics must keep their office visits on a regular basis-always every 3 months or less. It is at that appointment they will receive their prescriptions. Do not call for an additional month supply. An office visit is necessary. Appointments- you will keep all appointments with doctors, therapist, and counselors. If you miss your scheduled appointments often, then your doctor may slowly decrease your dose of narcotic drugs until you are no longer taking it. The dates when your prescription was filled at the pharmacy will be per 5. The state wide database will be checked at  standard visits to make sure the patient is not receiving pain prescriptions from other physicians.  Random drug testing for illegal substances will be standard. Your doctor may have you give urine, blood, hair, or saliva to run tests. If you have test results that are not normal then your doctor may slowly decrease your dose of narcotic drugs until you are no longer taking the medication or stop prescribing additional pain medications immediately. Refusal to do urine drug testing is basis for the practice no longer prescribe narcotic pain medications. Pain management specialist If your provider feels it is in your best interest to see a pain medicine specialist we will advise you have such and help you with the referral. Sometimes this referral is made because standard dosing of short acting medication is no longer keeping the patient's pain under reasonable control. Sometimes this is based upon a patient's health issue, and it is felt that pain management would best serve the patient's needs.  Once under the care of pain management we will no longer be prescribing the narcotic medications. This will be under the guidance of the pain management specialist. Further pain medication prescriptions will not be reassumed by this office once referred to pain management. In these situations we will continue to provide primary care but not pain medications.  Violations of the pain management agreement will result in our office no longer prescribing pain medications. In some situations it will also result in dismissal of the patient from our practice.  Health information-your doctor may need to discuss your treatment with pharmacists or other providers. Legal authorities may ask for your pain treatment records. If this happens then records will be given to them up on proper documentation/release.  You should also be aware that properly taking pain medications is very important in regards to operating a vehicle.  Even with following proper prescriptions instructions it is possible to be charged with operating a vehicle under influence. It is highly important that if you feel drowsy or drug that you do not operate a motor vehicle for the safety of yourself and others.  

## 2022-02-14 NOTE — Progress Notes (Signed)
Subjective:    Patient ID: Melinda Moore, female    DOB: 1970/08/02, 51 y.o.   MRN: [id number]  HPI  This patient was seen today for chronic pain  The medication list was reviewed and updated.  Location of Pain for which the patient has been treated with regarding narcotics: right leg , low back   Onset of this pain: chronic   -Compliance with medication: daily  - Number patient states they take daily: 3. 5 qd   -when was the last dose patient took? 11:30 today  The patient was advised the importance of maintaining medication and not using illegal substances with these.  Here for refills and follow up  The patient was educated that we can provide 3 monthly scripts for their medication, it is their responsibility to follow the instructions.  Side effects or complications from medications: no  Patient is aware that pain medications are meant to minimize the severity of the pain to allow their pain levels to improve to allow for better function. They are aware of that pain medications cannot totally remove their pain.  Due for UDT ( at least once per year) : 02/22/21  Scale of 1 to 10 ( 1 is least 10 is most) Your pain level without the medicine: 10 Your pain level with medication 7  Scale 1 to 10 ( 1-helps very little, 10 helps very well) How well does your pain medication reduce your pain so you can function better through out the day? 7  Quality of the pain: constant pain   Persistence of the pain: constant  Modifying factors: brace below the knee  Encounter for long-term opiate analgesic use - Plan: ToxASSURE Select 13 (MW), Urine, HYDROmorphone (DILAUDID) 4 MG tablet, Lipid panel, Basic metabolic panel, Hepatic Function Panel  MS (multiple sclerosis) ([place]) - Plan: ToxASSURE Select 13 (MW), Urine, Lipid panel, Basic metabolic panel, Hepatic Function Panel  Chronic anxiety - Plan: buPROPion (WELLBUTRIN SR) 150 MG 12 hr tablet  Nausea without vomiting - Plan:  ondansetron (ZOFRAN) 8 MG tablet  Hyperlipidemia, unspecified hyperlipidemia type - Plan: Lipid panel, Basic metabolic panel, Hepatic Function Panel  High risk medication use - Plan: Lipid panel, Basic metabolic panel, Hepatic Function Panel  Immunization due - Plan: Pneumococcal conjugate vaccine 20-valent (Prevnar 20)       Review of Systems     Objective:   Physical Exam  General-in no acute distress Eyes-no discharge Lungs-respiratory rate normal, CTA CV-no murmurs,RRR Extremities skin warm dry no edema Neuro grossly normal Behavior normal, alert       Assessment & Plan:  1. Encounter for long-term opiate analgesic use The patient was seen in followup for chronic pain. A review over at their current pain status was discussed. Drug registry was checked. Prescriptions were given.  Regular follow-up recommended. Discussion was held regarding the importance of compliance with medication as well as pain medication contract.  Patient was informed that medication may cause drowsiness and should not be combined  with other medications/alcohol or street drugs. If the patient feels medication is causing altered alertness then do not drive or operate dangerous equipment.  Should be noted that the patient appears to be meeting appropriate use of opioids and response.  Evidenced by improved function and decent pain control without significant side effects and no evidence of overt aberrancy issues.  Upon discussion with the patient today they understand that opioid therapy is optional and they feel that the pain has been refractory to reasonable  conservative measures and is significant and affecting quality of life enough to warrant ongoing therapy and wishes to continue opioids.  Refills were provided.  - ToxASSURE Select 13 (MW), Urine - HYDROmorphone (DILAUDID) 4 MG tablet; Take 1/2 to 1 tablet qid prn , maximum use per day is 3-1/2 tablets  Dispense: 105 tablet; Refill: 0  2. MS  (multiple sclerosis) ([place]) Under good control follows with her specialist regular basis - ToxASSURE Select 13 (MW), Urine  3. Chronic anxiety Medication does help patient stress levels are reasonable - buPROPion (WELLBUTRIN SR) 150 MG 12 hr tablet; Take 1 tablet (150 mg total) by mouth 2 (two) times daily.  Dispense: 180 tablet; Refill: 1  4. Nausea without vomiting Zofran as needed for nausea that comes and goes - ondansetron (ZOFRAN) 8 MG tablet; Take 1 tablet (8 mg total) by mouth 2 (two) times daily as needed for nausea or vomiting.  Dispense: 20 tablet; Refill: 4  5. Hyperlipidemia, unspecified hyperlipidemia type Lipid profile ordered  6. High risk medication use Liver met 7 ordered

## 2022-02-15 ENCOUNTER — Encounter (HOSPITAL_COMMUNITY): Payer: Self-pay | Admitting: Internal Medicine

## 2022-02-16 LAB — TOXASSURE SELECT 13 (MW), URINE

## 2022-03-07 NOTE — Telephone Encounter (Signed)
Pt already scheduled appt with Dr. Gala Romney

## 2022-03-17 ENCOUNTER — Encounter: Payer: Self-pay | Admitting: Family Medicine

## 2022-03-19 ENCOUNTER — Other Ambulatory Visit: Payer: Self-pay | Admitting: Family Medicine

## 2022-03-19 MED ORDER — HYDROMORPHONE HCL 4 MG PO TABS: ORAL_TABLET | ORAL | 0 refills | Status: DC

## 2022-03-20 ENCOUNTER — Other Ambulatory Visit (HOSPITAL_COMMUNITY): Payer: Self-pay | Admitting: Family Medicine

## 2022-03-20 DIAGNOSIS — Z1231 Encounter for screening mammogram for malignant neoplasm of breast: Secondary | ICD-10-CM

## 2022-03-21 ENCOUNTER — Ambulatory Visit: Payer: 59 | Admitting: Internal Medicine

## 2022-03-26 ENCOUNTER — Ambulatory Visit (HOSPITAL_COMMUNITY)
Admission: RE | Admit: 2022-03-26 | Discharge: 2022-03-26 | Disposition: A | Discharge status: Home / Self Care | Payer: 59 | Source: Ambulatory Visit | Attending: Family Medicine | Admitting: Family Medicine

## 2022-03-26 DIAGNOSIS — Z1231 Encounter for screening mammogram for malignant neoplasm of breast: Secondary | ICD-10-CM | POA: Diagnosis present

## 2022-03-29 ENCOUNTER — Ambulatory Visit (INDEPENDENT_AMBULATORY_CARE_PROVIDER_SITE_OTHER): Payer: 59 | Admitting: Internal Medicine

## 2022-03-29 ENCOUNTER — Encounter: Payer: Self-pay | Admitting: Internal Medicine

## 2022-03-29 VITALS — BP 126/80 | HR 105 | Temp 98.2°F | Ht 69.0 in | Wt 251.6 lb

## 2022-03-29 DIAGNOSIS — Z1211 Encounter for screening for malignant neoplasm of colon: Secondary | ICD-10-CM

## 2022-03-29 DIAGNOSIS — K219 Gastro-esophageal reflux disease without esophagitis: Secondary | ICD-10-CM

## 2022-03-29 DIAGNOSIS — K5900 Constipation, unspecified: Secondary | ICD-10-CM | POA: Diagnosis not present

## 2022-03-29 NOTE — Patient Instructions (Signed)
It was good to see you again today!  Stop Linzess  Begin Symproic (1) 0.2 mg tablet daily.  Watch for the primary side effect of diarrhea.  Do not have any samples today.  New prescription to be provided (dispense 30 with 11 refills)  Call me in 2 to 3 weeks and let me know how this medication is working for you  Continue Protonix 40 mg twice daily (best taken 30 minutes before breakfast and supper)  Since we have had a hard time getting her colon adequately prepped on 2 occasions, we will go with a Cologuard to screen for cancer as discussed.  Further recommendations to follow  Office visit with [place] in 3 months.

## 2022-03-29 NOTE — Progress Notes (Signed)
Primary Care Physician:  Babs Sciara, MD Primary Gastroenterologist:  Dr. Jena Gauss  Pre-Procedure History & Physical: HPI:  Melinda Moore is a 51 y.o. female here for follow-up.  We have attempted and failed to get an adequate colonoscopy accomplished earlier this year due to poor prep x 2 attempts)  Patient adamantly endorses compliance with regimen.  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  Linzess 145 overall inadequate in management.  Even though we tried a more aggressive novel regimen at our second attempt in August prep was grossly inadequate.  No prior complete colonoscopy.  Family history of colon polyps but no cancer. GERD well-controlled on Protonix 40 mg twice daily although she takes her second dose at bedtime and not before supper.  No dysphagia. Patient wants to consider some other form of colorectal cancer screening examination.  Past Medical History:  Diagnosis Date   Anemia    Anxiety    Bell's palsy    Depression    DVT (deep venous thrombosis) (HCC) 09/02/2012   [place] on 02/13/2012 showed acute DVT in left calf veins and posterior tibial veins   Fibromyalgia    GERD (gastroesophageal reflux disease)    History of blood transfusion 2000   History of trigeminal neuralgia    HTN (hypertension)    patient denies was taking a blood pressure medication in early 2000 but it was migraines   Leukopenia    MS (multiple sclerosis) (HCC) 1997   Neuropathic pain    Peripheral edema    Port catheter in place 12/31/2012   Right bundle branch block     Past Surgical History:  Procedure Laterality Date   ABDOMINAL HYSTERECTOMY     CHOLECYSTECTOMY     COLONOSCOPY WITH PROPOFOL N/A 09/27/2021   Procedure: COLONOSCOPY WITH PROPOFOL;  Surgeon: Corbin Ade, MD;  [facility];  Service: Endoscopy;  Laterality: N/A;  8:00am, asa 2   ESOPHAGOGASTRODUODENOSCOPY  2011   Dr. Jena Gauss. 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 Biopsy from the stomach revealed minimal chronic inactive inflammation but negative for H. pylori   FLEXIBLE SIGMOIDOSCOPY N/A 02/08/2022   Procedure: FLEXIBLE SIGMOIDOSCOPY;  Surgeon: Corbin Ade, MD;  Location: [facility];  Service: Endoscopy;  Laterality: N/A;   LAPAROSCOPIC GASTRIC SLEEVE RESECTION N/A 04/17/2016   Procedure: LAPAROSCOPIC GASTRIC SLEEVE RESECTION WITH UPPER ENDOSCOPY;  Surgeon: Glenna Fellows, MD;  Location: [facility];  Service: General;  Laterality: N/A;   PATELLA-FEMORAL ARTHROPLASTY Right 07/14/2019   Procedure: PATELLA-FEMORAL ARTHROPLASTY;  Surgeon: Teryl Lucy, MD;  Location: [facility];  Service: Orthopedics;  Laterality: Right;   PORTACATH PLACEMENT     TUBAL LIGATION  2001    Prior to Admission medications   Medication Sig Start Date End Date Taking? Authorizing Provider  ascorbic acid (VITAMIN C) 500 MG tablet Take 1 tablet (500 mg total) by mouth daily. 02/15/20  Yes Johnson, Clanford L, MD  baclofen (LIORESAL) 10 MG tablet Take 10 mg by mouth 2 (two) times daily. 03/09/22  Yes [provider]  Bioflavonoid Products (BIOFLEX) TABS Take 2 tablets by mouth daily.   Yes [provider]  buPROPion (WELLBUTRIN SR) 150 MG 12 hr tablet Take 1 tablet (150 mg total) by mouth 2 (two) times daily. 02/14/22  Yes Babs Sciara, MD  carbamazepine (TEGRETOL XR) 200 MG 12 hr tablet Take 1 tablet (200 mg total) by mouth 3 (  three) times daily as needed. 06/14/21  Yes Ameduite, Alvino Chapel, NP  Cholecalciferol (VITAMIN D3) 125 MCG (5000 UT) TABS Take 20,000 Units by mouth 2 (two) times daily.   Yes [provider]  gabapentin (NEURONTIN) 300 MG capsule Take 1,200 mg by mouth 3 (three) times daily. 02/04/21  Yes [provider]  HYDROmorphone (DILAUDID) 4 MG tablet Take 1/2 to 1 tablet qid prn , maximum use per day is 3-1/2 tablets 02/14/22  Yes Luking, Scott A, MD   HYDROmorphone (DILAUDID) 4 MG tablet Take 1/2 to 1 tablet po qid prn, max use per day is 3-1/2 tablets. 02/14/22  Yes Babs Sciara, MD  HYDROmorphone (DILAUDID) 4 MG tablet Take 1/2 to 1 tablet po qid prn, max use per day is 3-1/2 tablets 03/19/22  Yes Luking, Jonna Coup, MD  linaclotide (LINZESS) 145 MCG CAPS capsule Take 1 capsule (145 mcg total) by mouth daily before breakfast. 10/31/21  Yes Mahon, Frederik Schmidt, NP  Multiple Vitamin (MULTIVITAMIN WITH MINERALS) TABS tablet Take 1 tablet by mouth daily. ALIVE   Yes [provider]  Multiple Vitamins-Minerals (EMERGEN-C IMMUNE) PACK Take 1 packet by mouth once a week.   Yes [provider]  Ofatumumab (KESIMPTA) 20 MG/0.4ML SOAJ Inject into the skin. 10/09/21  Yes [provider]  ondansetron (ZOFRAN) 8 MG tablet Take 1 tablet (8 mg total) by mouth 2 (two) times daily as needed for nausea or vomiting. 02/14/22  Yes Babs Sciara, MD  pantoprazole (PROTONIX) 40 MG tablet Take 1 tablet (40 mg total) by mouth 2 (two) times daily before a meal. 09/07/21  Yes Clearance Coots, Kristen S, PA-C  potassium chloride SA (KLOR-CON M) 20 MEQ tablet 1 qd 02/14/22  Yes Luking, Scott A, MD  sertraline (ZOLOFT) 50 MG tablet TAKE ONE-HALF TABLET BY MOUTH TWICE A DAY 01/23/22  Yes Ameduite, Alvino Chapel, NP  torsemide (DEMADEX) 20 MG tablet Take 1/2 to 1  tablet po q am prn pedal edema 02/14/22  Yes Luking, Scott A, MD  zinc sulfate 220 (50 Zn) MG capsule Take 1 capsule (220 mg total) by mouth daily. 02/15/20  Yes Johnson, Clanford L, MD  naloxone [facility]) nasal spray 4 mg/0.1 mL Use as directed if accidental overdose Patient not taking: Reported on 03/29/2022 01/25/20   Babs Sciara, MD    Allergies as of 03/29/2022 - Review Complete 03/29/2022  Allergen Reaction Noted   Ambien [zolpidem tartrate]  02/08/2014   Tape Itching and Rash 03/26/2011    Family History  Problem Relation Age of Onset   Healthy Mother    Heart disease Father    Hyperlipidemia  Father    Congestive Heart Failure Father    Hypertension Sister    Colon polyps Sister    Colon polyps Sister    Colon polyps Sister    Colon polyps Sister    Cirrhosis Brother        etoh   Healthy Daughter    Healthy Daughter    Colon cancer Paternal Uncle     Social History   Socioeconomic History   Marital status: Married    Spouse name: Viviann Spare   Number of children: 2   Years of education: some college   Highest education level: Not on file  Occupational History   Occupation: disabled  Tobacco Use   Smoking status: Former    Packs/day: 0.25    Types: Cigarettes    Quit date: 1990    Years since quitting: 33.8  Smokeless tobacco: Never   Tobacco comments:    4 a day, quit in 1990  Vaping Use   Vaping Use: Never used  Substance and Sexual Activity   Alcohol use: No   Drug use: No   Sexual activity: Not Currently    Birth control/protection: Surgical  Other Topics Concern   Not on file  Social History Narrative   Lives at home with husband and 2 daughters. 3 grand daughters.    Right handed   Takes 1/2 caffeine pill per day   Social Determinants of Health   Financial Resource Strain: Low Risk  (06/13/2021)   Overall Financial Resource Strain (CARDIA)    Difficulty of Paying Living Expenses: Not hard at all  Food Insecurity: No Food Insecurity (06/13/2021)   Hunger Vital Sign    Worried About Running Out of Food in the Last Year: Never true    Ran Out of Food in the Last Year: Never true  Transportation Needs: No Transportation Needs (06/13/2021)   PRAPARE - Administrator, Civil Service (Medical): No    Lack of Transportation (Non-Medical): No  Physical Activity: Inactive (06/13/2021)   Exercise Vital Sign    Days of Exercise per Week: 0 days    Minutes of Exercise per Session: 0 min  Stress: No Stress Concern Present (06/13/2021)   [employer] of Occupational Health - Occupational Stress Questionnaire    Feeling of Stress : Not at all   Social Connections: Socially Integrated (06/13/2021)   Social Connection and Isolation Panel [NHANES]    Frequency of Communication with Friends and Family: More than three times a week    Frequency of Social Gatherings with Friends and Family: More than three times a week    Attends Religious Services: 1 to 4 times per year    Active Member of [employer] or Organizations: No    Attends Engineer, structural: 1 to 4 times per year    Marital Status: Married  Catering manager Violence: Not At Risk (06/13/2021)   Humiliation, Afraid, Rape, and Kick questionnaire    Fear of Current or Ex-Partner: No    Emotionally Abused: No    Physically Abused: No    Sexually Abused: No    Review of Systems: See HPI, otherwise negative ROS  Physical Exam: BP 126/80 (BP Location: Left Arm, Patient Position: Sitting, Cuff Size: Normal)   Pulse (!) 105   Temp 98.2 F (36.8 C) (Oral)   Ht 5\' 9"  (1.753 m)   Wt 251 lb 9.6 oz (114.1 kg)   BMI 37.15 kg/m  General:   Alert,  Well-developed, well-nourished, pleasant and cooperative in NAD Neck:  Supple; no masses or thyromegaly. No significant cervical adenopathy. Lungs:  Clear throughout to auscultation.   No wheezes, crackles, or rhonchi. No acute distress. Heart:  Regular rate and rhythm; no murmurs, clicks, rubs,  or gallops. Abdomen: Non-distended, normal bowel sounds.  Soft and nontender without appreciable mass or hepatosplenomegaly.  Pulses:  Normal pulses noted. Extremities:  Without clubbing or edema.  Impression/Plan: 51 year old lady with chronic opioid therapy and secondary constipation.  Even with aggressive, novel bowel regimen, she has been inadequately prepped for colonoscopy x2 occasions. At baseline, it seems like her constipation has been inadequately managed.  Linzess at the 145 dose is not working.  She wants to try something else.  She also wants to go ahead and set up to have some sort of alternative screening prior to revisiting  the possibility  of a optical colonoscopy in the future.  This is certainly not reasonable.  She asked if she could have a Cologuard.  Although this is not an ideal approach, particularly with her family history of colon polyps, I think that it would be a practical next step.  She understands if we do a Cologuard and it comes back positive that would certainly trigger pursuing a colonoscopy.  She needs a more effective chronic regimen for constipation.  We will consider initiating therapy for constipation addressing the cause more mechanistically.  She is requiring twice daily PPI therapy for adequate management of reflux symptoms.  She does take her evening dose at bedtime and not before her evening meal.  History of undergoing gastric sleeve in 2017 may be contributing to some of her reflux symptoms.  No alarm features.  Recommendations:  Stop Linzess  Begin Symproic (1) 0.2 mg tablet daily.  Watch for the primary side effect of diarrhea.  Do not have any samples today.  New prescription to be provided (dispense 30 with 11 refills)  Call me in 2 to 3 weeks and let me know how this medication is working for you  Continue Protonix 40 mg twice daily (best taken 30 minutes before breakfast and supper)  Since we have had a hard time getting her colon adequately prepped on 2 occasions, we will go with a Cologuard to screen for cancer as discussed.  Further recommendations to follow  Office visit with [place] in 3 months.   Notice: This dictation was prepared with Dragon dictation along with smaller phrase technology. Any transcriptional errors that result from this process are unintentional and may not be corrected upon review.

## 2022-03-30 ENCOUNTER — Other Ambulatory Visit: Payer: Self-pay

## 2022-03-30 DIAGNOSIS — Z1211 Encounter for screening for malignant neoplasm of colon: Secondary | ICD-10-CM

## 2022-03-30 MED ORDER — SYMPROIC 0.2 MG PO TABS: 0.2000 mg | ORAL_TABLET | Freq: Every day | ORAL | 1 refills | Status: DC

## 2022-05-08 ENCOUNTER — Other Ambulatory Visit: Payer: Self-pay | Admitting: Gastroenterology

## 2022-05-08 DIAGNOSIS — K219 Gastro-esophageal reflux disease without esophagitis: Secondary | ICD-10-CM

## 2022-05-16 ENCOUNTER — Ambulatory Visit (INDEPENDENT_AMBULATORY_CARE_PROVIDER_SITE_OTHER): Payer: 59 | Admitting: Family Medicine

## 2022-05-16 ENCOUNTER — Encounter: Payer: Self-pay | Admitting: Family Medicine

## 2022-05-16 VITALS — BP 132/78 | HR 85 | Temp 98.4°F | Wt 263.2 lb

## 2022-05-16 DIAGNOSIS — Z79891 Long term (current) use of opiate analgesic: Secondary | ICD-10-CM

## 2022-05-16 DIAGNOSIS — M5431 Sciatica, right side: Secondary | ICD-10-CM | POA: Diagnosis not present

## 2022-05-16 MED ORDER — HYDROMORPHONE HCL 4 MG PO TABS: ORAL_TABLET | ORAL | 0 refills | Status: DC

## 2022-05-16 NOTE — Progress Notes (Signed)
Subjective:    Patient ID: Melinda Moore, female    DOB: [birth year], 51 y.o.   MRN: [id number]  HPI This patient was seen today for chronic pain  The medication list was reviewed and updated.  Location of Pain for which the patient has been treated with regarding narcotics:   Onset of this pain: right knee pain    -Compliance with medication: yes  - Number patient states they take daily: 3 x a day   -when was the last dose patient took? First thing this morning   The patient was advised the importance of maintaining medication and not using illegal substances with these.  Here for refills and follow up  The patient was educated that we can provide 3 monthly scripts for their medication, it is their responsibility to follow the instructions.  Side effects or complications from medications: no  Patient is aware that pain medications are meant to minimize the severity of the pain to allow their pain levels to improve to allow for better function. They are aware of that pain medications cannot totally remove their pain.  Due for UDT ( at least once per year) : yes 02/14/2022  Scale of 1 to 10 ( 1 is least 10 is most) Your pain level without the medicine: 10 Your pain level with medication 6  Scale 1 to 10 ( 1-helps very little, 10 helps very well) How well does your pain medication reduce your pain so you can function better through out the day? 3  Quality of the pain: able to daily activity with pain medication  Persistence of the pain: nagging pain  Modifying factors: Worse with activity         Review of Systems     Objective:   Physical Exam  General-in no acute distress Eyes-no discharge Lungs-respiratory rate normal, CTA CV-no murmurs,RRR Extremities skin warm dry no edema Neuro grossly normal Behavior normal, alert Positive straight leg raise on the right      Assessment & Plan:  1. Sciatica, right side Possibly would benefit from injection she  deserves evaluation with physical medicine pain medicine will only do so much  2. Encounter for long-term opiate analgesic use Because of her increased pain and discomfort we will increase the Dilaudid to no more than 4 times per day caution drowsiness if it does cause drowsiness cut back to the 3-1/2/day in addition to this patient was educated regarding the use of Narcan We will do a phone follow-up in 3 weeks and office visit follow-up in 3 months  The patient was seen in followup for chronic pain. A review over at their current pain status was discussed. Drug registry was checked. Prescriptions were given.  Regular follow-up recommended. Discussion was held regarding the importance of compliance with medication as well as pain medication contract.  Patient was informed that medication may cause drowsiness and should not be combined  with other medications/alcohol or street drugs. If the patient feels medication is causing altered alertness then do not drive or operate dangerous equipment.  Should be noted that the patient appears to be meeting appropriate use of opioids and response.  Evidenced by improved function and decent pain control without significant side effects and no evidence of overt aberrancy issues.  Upon discussion with the patient today they understand that opioid therapy is optional and they feel that the pain has been refractory to reasonable conservative measures and is significant and affecting quality of life enough to warrant ongoing  therapy and wishes to continue opioids.  Refills were provided.  - HYDROmorphone (DILAUDID) 4 MG tablet; 1 q 6 hours prn pain max 4 per day  Dispense: 120 tablet; Refill: 0  3. Morbid obesity (HCC) Portion control regular activity  Patient was encouraged to get her lab work completed

## 2022-05-16 NOTE — Patient Instructions (Signed)
Hi We will do a follow-up visit in 3 weeks If the medication is causing too much drowsiness please go back to your original dose then let us know you are having troubles  We will also be doing the referral to the pain management center with   We will see you back here in approximately 3 months

## 2022-05-17 NOTE — Addendum Note (Signed)
Addended by: Marlowe Shores on: 05/17/2022 02:58 PM   Modules accepted: Orders

## 2022-05-17 NOTE — Progress Notes (Signed)
05/17/22- referral placed to  [facility]

## 2022-05-18 ENCOUNTER — Other Ambulatory Visit: Payer: Self-pay | Admitting: Family Medicine

## 2022-06-01 ENCOUNTER — Encounter (HOSPITAL_COMMUNITY): Payer: Self-pay

## 2022-06-01 NOTE — Therapy (Signed)
PHYSICAL THERAPY DISCHARGE SUMMARY  Visits from Start of Care: 7  Current functional level related to goals / functional outcomes: na   Remaining deficits: na   Education / Equipment: na   Patient agrees to discharge. Patient goals were partially met. Patient is being discharged due to not returning since the last visit.

## 2022-06-05 ENCOUNTER — Ambulatory Visit (INDEPENDENT_AMBULATORY_CARE_PROVIDER_SITE_OTHER): Payer: 59 | Admitting: Family Medicine

## 2022-06-05 ENCOUNTER — Telehealth: Payer: Self-pay

## 2022-06-05 ENCOUNTER — Telehealth: Payer: 59 | Admitting: Family Medicine

## 2022-06-05 VITALS — Wt 262.0 lb

## 2022-06-05 DIAGNOSIS — Z79891 Long term (current) use of opiate analgesic: Secondary | ICD-10-CM | POA: Diagnosis not present

## 2022-06-05 NOTE — Telephone Encounter (Signed)
Melinda Moore, Melinda Moore are scheduled for a virtual visit with your provider today.    Just as we do with appointments in the office, we must obtain your consent to participate.  Your consent will be active for this visit and any virtual visit you may have with one of our providers in the next 365 days.    If you have a MyChart account, I can also send a copy of this consent to you electronically.  All virtual visits are billed to your insurance company just like a traditional visit in the office.  As this is a virtual visit, video technology does not allow for your provider to perform a traditional examination.  This may limit your provider's ability to fully assess your condition.  If your provider identifies any concerns that need to be evaluated in person or the need to arrange testing such as labs, EKG, etc, we will make arrangements to do so.    Although advances in technology are sophisticated, we cannot ensure that it will always work on either your end or our end.  If the connection with a video visit is poor, we may have to switch to a telephone visit.  With either a video or telephone visit, we are not always able to ensure that we have a secure connection.   I need to obtain your verbal consent now.   Are you willing to proceed with your visit today?   Melinda Moore has provided verbal consent on 06/05/2022 for a virtual visit (video or telephone).    06/05/2022  5:07 PM

## 2022-06-05 NOTE — Progress Notes (Signed)
   Subjective:    Patient ID: Melinda Moore, female    DOB: 1971-01-16, 51 y.o.   MRN: [id number] I connected with  Melinda Moore on 06/05/22 by a video enabled telemedicine application and verified that I am speaking with the correct person using two identifiers.   I discussed the limitations of evaluation and management by telemedicine. The patient expressed understanding and agreed to proceed.  Patient location: home  Provider location: in office  I provided 11 minutes of non face - to - face time during this encounter.  HPI   This patient was seen today for chronic pain  The medication list was reviewed and updated.  Location of Pain for which the patient has been treated with regarding narcotics: Right leg   Onset of this pain: years   -Compliance with medication: yes  - Number patient states they take daily: 4 times daily  -when was the last dose patient took? Lunch time 1:00pm  The patient was advised the importance of maintaining medication and not using illegal substances with these.  Here for refills and follow up yes  The patient was educated that we can provide 3 monthly scripts for their medication, it is their responsibility to follow the instructions.  Side effects or complications from medications: no  Patient is aware that pain medications are meant to minimize the severity of the pain to allow their pain levels to improve to allow for better function. They are aware of that pain medications cannot totally remove their pain.  Due for UDT ( at least once per year) : 02/16/2022  Scale of 1 to 10 ( 1 is least 10 is most) Your pain level without the medicine: 10 Your pain level with medication 7  Scale 1 to 10 ( 1-helps very little, 10 helps very well) How well does your pain medication reduce your pain so you can function better through out the day? 5  Quality of the pain: nagging pain  Persistence of the pain: constant  Modifying factors:            Objective:   Physical Exam  Today's visit was via telephone Physical exam was not possible for this visit Patient states medications not causing drowsiness fatigue or thinking issues she is able to function well and drives well with the medicine      Assessment & Plan:   Patient is managing well on 4 pain pills per day not having any setbacks  Patient is supposed to see spinal specialist for nonmedication pain management so far they have not set up the appointment  She will let us know if they have not set up the appointment within the next 2 weeks 2 additional prescriptions were sent in we will see her back in the near future approximately 2 and half months Follow-up plan is to recheck in March as planned

## 2022-06-06 ENCOUNTER — Telehealth: Payer: 59 | Admitting: Family Medicine

## 2022-06-08 MED ORDER — HYDROMORPHONE HCL 4 MG PO TABS: ORAL_TABLET | ORAL | 0 refills | Status: DC

## 2022-06-15 ENCOUNTER — Encounter: Payer: Self-pay | Admitting: Family Medicine

## 2022-06-18 ENCOUNTER — Other Ambulatory Visit: Payer: Self-pay | Admitting: Family Medicine

## 2022-06-18 DIAGNOSIS — Z79891 Long term (current) use of opiate analgesic: Secondary | ICD-10-CM

## 2022-06-18 MED ORDER — HYDROMORPHONE HCL 4 MG PO TABS: ORAL_TABLET | ORAL | 0 refills | Status: DC

## 2022-06-22 ENCOUNTER — Ambulatory Visit (INDEPENDENT_AMBULATORY_CARE_PROVIDER_SITE_OTHER): Payer: 59

## 2022-06-22 VITALS — Ht 69.0 in | Wt 262.0 lb

## 2022-06-22 DIAGNOSIS — Z0000 Encounter for general adult medical examination without abnormal findings: Secondary | ICD-10-CM | POA: Diagnosis not present

## 2022-06-22 NOTE — Progress Notes (Signed)
Subjective:   Melinda Moore is a 52 y.o. female who presents for Medicare Annual (Subsequent) preventive examination.  I connected with  Melinda Moore on 06/22/22 by a audio enabled telemedicine application and verified that I am speaking with the correct person using two identifiers.  Patient Location: Home  Provider Location: Office/Clinic  I discussed the limitations of evaluation and management by telemedicine. The patient expressed understanding and agreed to proceed.  Review of Systems     Cardiac Risk Factors include: sedentary lifestyle     Objective:    Today's Vitals   06/22/22 1454  Weight: 262 lb (118.8 kg)  Height: 5\' 9"  (1.753 m)   Body mass index is 38.69 kg/m.     06/22/2022    2:57 PM 02/08/2022    6:47 AM 02/06/2022    2:57 PM 12/28/2021    3:14 PM 09/27/2021    6:36 AM 06/13/2021    2:35 PM 02/20/2021    8:51 AM  Advanced Directives  Does Patient Have a Medical Advance Directive? No No No No No No No  Would patient like information on creating a medical advance directive? No - Patient declined No - Patient declined No - Patient declined No - Patient declined No - Patient declined No - Patient declined No - Patient declined    Current Medications (verified) Outpatient Encounter Medications as of 06/22/2022  Medication Sig   ascorbic acid (VITAMIN C) 500 MG tablet Take 1 tablet (500 mg total) by mouth daily.   baclofen (LIORESAL) 10 MG tablet Take 10 mg by mouth 2 (two) times daily.   Bioflavonoid Products (BIOFLEX) TABS Take 2 tablets by mouth daily.   buPROPion (WELLBUTRIN SR) 150 MG 12 hr tablet Take 1 tablet (150 mg total) by mouth 2 (two) times daily.   carbamazepine (TEGRETOL XR) 200 MG 12 hr tablet Take 1 tablet (200 mg total) by mouth 3 (three) times daily as needed.   Cholecalciferol (VITAMIN D3) 125 MCG (5000 UT) TABS Take 20,000 Units by mouth 2 (two) times daily.   gabapentin (NEURONTIN) 300 MG capsule Take 1,200 mg by mouth 3 (three) times daily.    HYDROmorphone (DILAUDID) 4 MG tablet 1 tablet po qid prn, max use per day is 4 tablets.   HYDROmorphone (DILAUDID) 4 MG tablet 1 q 6 hours prn pain max 4 per day   HYDROmorphone (DILAUDID) 4 MG tablet 1 tablet po qid prn, max use per day is 4 tablets   linaclotide (LINZESS) 145 MCG CAPS capsule Take 1 capsule (145 mcg total) by mouth daily before breakfast.   Multiple Vitamin (MULTIVITAMIN WITH MINERALS) TABS tablet Take 1 tablet by mouth daily. ALIVE   Multiple Vitamins-Minerals (EMERGEN-C IMMUNE) PACK Take 1 packet by mouth once a week.   Naldemedine Tosylate (SYMPROIC) 0.2 MG TABS Take 0.2 mg by mouth daily.   naloxone (NARCAN) 4 MG/0.1ML LIQD nasal spray kit SPRAY INTO NOSE AS DIRECTED FOR ACCIDENTAL OVERDOSE   Ofatumumab (KESIMPTA) 20 MG/0.4ML SOAJ Inject into the skin.   ondansetron (ZOFRAN) 8 MG tablet Take 1 tablet (8 mg total) by mouth 2 (two) times daily as needed for nausea or vomiting.   pantoprazole (PROTONIX) 40 MG tablet TAKE ONE TABLET BY MOUTH TWICE A DAY BEFORE A MEAL   potassium chloride SA (KLOR-CON M) 20 MEQ tablet 1 qd   sertraline (ZOLOFT) 50 MG tablet TAKE ONE-HALF TABLET BY MOUTH TWICE A DAY   torsemide (DEMADEX) 20 MG tablet Take 1/2 to 1  tablet po q am prn pedal edema   zinc sulfate 220 (50 Zn) MG capsule Take 1 capsule (220 mg total) by mouth daily.   No facility-administered encounter medications on file as of 06/22/2022.    Allergies (verified) Ambien [zolpidem tartrate] and Tape   History: Past Medical History:  Diagnosis Date   Anemia    Anxiety    Bell's palsy    Depression    DVT (deep venous thrombosis) ([place]) 09/02/2012   [place] on 02/13/2012 showed acute DVT in left calf veins and posterior tibial veins   Fibromyalgia    GERD (gastroesophageal reflux disease)    History of blood transfusion 2000   History of trigeminal neuralgia    HTN (hypertension)    patient denies was taking a blood pressure medication in early 2000 but it was migraines    Leukopenia    MS (multiple sclerosis) ([place]) 1997   Neuropathic pain    Peripheral edema    Port catheter in place 12/31/2012   Right bundle branch block    Past Surgical History:  Procedure Laterality Date   ABDOMINAL HYSTERECTOMY     CHOLECYSTECTOMY     COLONOSCOPY WITH PROPOFOL N/A 09/27/2021   Procedure: COLONOSCOPY WITH PROPOFOL;  Surgeon: Daneil Dolin, MD;  Location: [facility];  Service: Endoscopy;  Laterality: N/A;  8:00am, asa 2   ESOPHAGOGASTRODUODENOSCOPY  2011   Dr. Gala Romney. 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 Biopsy from the stomach revealed minimal chronic inactive inflammation but negative for H. pylori   FLEXIBLE SIGMOIDOSCOPY N/A 02/08/2022   Procedure: FLEXIBLE SIGMOIDOSCOPY;  Surgeon: Daneil Dolin, MD;  Location: [facility];  Service: Endoscopy;  Laterality: N/A;   LAPAROSCOPIC GASTRIC SLEEVE RESECTION N/A 04/17/2016   Procedure: LAPAROSCOPIC GASTRIC SLEEVE RESECTION WITH UPPER ENDOSCOPY;  Surgeon: Excell Seltzer, MD;  Location: [facility];  Service: General;  Laterality: N/A;   PATELLA-FEMORAL ARTHROPLASTY Right 07/14/2019   Procedure: PATELLA-FEMORAL ARTHROPLASTY;  Surgeon: Marchia Bond, MD;  Location: [facility];  Service: Orthopedics;  Laterality: Right;   PORTACATH PLACEMENT     TUBAL LIGATION  2001   Family History  Problem Relation Age of Onset   Healthy Mother    Heart disease Father    Hyperlipidemia Father    Congestive Heart Failure Father    Hypertension Sister    Colon polyps Sister    Colon polyps Sister    Colon polyps Sister    Colon polyps Sister    Cirrhosis Brother        etoh   Healthy Daughter    Healthy Daughter    Colon cancer Paternal Uncle    Social History   Socioeconomic History   Marital status: Married    Spouse name: Remo Lipps   Number of children: 2   Years of education: some college   Highest education level: Not on  file  Occupational History   Occupation: disabled  Tobacco Use   Smoking status: Former    Packs/day: 0.25    Types: Cigarettes    Quit date: 1990    Years since quitting: 34.0   Smokeless tobacco: Never   Tobacco comments:    4 a day, quit in 1990  Vaping Use   Vaping Use: Never used  Substance and Sexual Activity   Alcohol use: No   Drug use: No   Sexual activity: Not Currently    Birth control/protection: Surgical  Other Topics Concern  Not on file  Social History Narrative   Lives at home with husband and 2 daughters. 3 grand daughters.    Right handed   Takes 1/2 caffeine pill per day   Social Determinants of Health   Financial Resource Strain: Low Risk  (06/22/2022)   Overall Financial Resource Strain (CARDIA)    Difficulty of Paying Living Expenses: Not hard at all  Food Insecurity: No Food Insecurity (06/22/2022)   Hunger Vital Sign    Worried About Running Out of Food in the Last Year: Never true    Ran Out of Food in the Last Year: Never true  Transportation Needs: No Transportation Needs (06/22/2022)   PRAPARE - Administrator, Civil Service (Medical): No    Lack of Transportation (Non-Medical): No  Physical Activity: Inactive (06/22/2022)   Exercise Vital Sign    Days of Exercise per Week: 0 days    Minutes of Exercise per Session: 0 min  Stress: No Stress Concern Present (06/22/2022)   [employer] of Occupational Health - Occupational Stress Questionnaire    Feeling of Stress : Not at all  Social Connections: Socially Integrated (06/22/2022)   Social Connection and Isolation Panel [NHANES]    Frequency of Communication with Friends and Family: More than three times a week    Frequency of Social Gatherings with Friends and Family: Three times a week    Attends Religious Services: 1 to 4 times per year    Active Member of Clubs or Organizations: No    Attends Engineer, structural: 1 to 4 times per year    Marital Status: Married     Tobacco Counseling Counseling given: Not Answered Tobacco comments: 4 a day, quit in 1990   Clinical Intake:  Pre-visit preparation completed: Yes  Pain : No/denies pain  Diabetes: No  How often do you need to have someone help you when you read instructions, pamphlets, or other written materials from your doctor or pharmacy?: 1 - Never  Diabetic?No   Interpreter Needed?: No  Information entered by :: Kandis Fantasia LPN   Activities of Daily Living    06/22/2022    2:57 PM 02/06/2022    2:59 PM  In your present state of health, do you have any difficulty performing the following activities:  Hearing? 0   Vision? 0   Difficulty concentrating or making decisions? 0   Walking or climbing stairs? 0   Dressing or bathing? 0   Doing errands, shopping? 0 0  Preparing Food and eating ? N   Using the Toilet? N   In the past six months, have you accidently leaked urine? N   Do you have problems with loss of bowel control? N   Managing your Medications? N   Managing your Finances? N   Housekeeping or managing your Housekeeping? N     Patient Care Team: Babs Sciara, MD as PCP - General (Family Medicine) Jena Gauss, Gerrit Friends, MD as Attending Physician (Gastroenterology)  Indicate any recent Medical Services you may have received from other than [facility] providers in the past year (date may be approximate).     Assessment:   This is a routine wellness examination for Ameliana.  Hearing/Vision screen Hearing Screening - Comments:: Denies hearing difficulties  Vision Screening - Comments:: Wears rx glasses - up to date with routine eye exams with Dr. Charise Killian     Dietary issues and exercise activities discussed: Current Exercise Habits: The patient does not participate in regular  exercise at present   Goals Addressed   None    Depression Screen    06/22/2022    2:56 PM 06/05/2022    4:58 PM 05/16/2022    1:18 PM 06/13/2021    2:32 PM 02/22/2021    1:19 PM 11/21/2020    11:15 AM 08/15/2020    8:53 AM  PHQ 2/9 Scores  PHQ - 2 Score 0 0 0 0 0 0 0  PHQ- 9 Score  1         Fall Risk    06/22/2022    2:55 PM 06/05/2022    5:01 PM 05/16/2022    1:18 PM 06/13/2021    2:36 PM 02/22/2021    1:19 PM  [place] in the past year? 0 0 0 0 0  Number falls in past yr: 0 0 0 0 0  Injury with Fall? 0 0 0 0 0  Risk for fall due to : No Fall Risks   Impaired balance/gait;Impaired mobility No Fall Risks  Follow up Falls evaluation completed;Education provided;Falls prevention discussed   Falls prevention discussed Falls evaluation completed    FALL RISK PREVENTION PERTAINING TO THE HOME:  Any stairs in or around the home? Yes  If so, are there any without handrails? No  Home free of loose throw rugs in walkways, pet beds, electrical cords, etc? Yes  Adequate lighting in your home to reduce risk of falls? Yes   ASSISTIVE DEVICES UTILIZED TO PREVENT FALLS:  Life alert? No  Use of a cane, walker or w/c? No  Grab bars in the bathroom? Yes  Shower chair or bench in shower? No  Elevated toilet seat or a handicapped toilet? Yes   TIMED UP AND GO:  Was the test performed? No . Telephonic visit   Cognitive Function:        06/22/2022    2:58 PM  6CIT Screen  What Year? 0 points  What month? 0 points  What time? 0 points  Count back from 20 0 points  Months in reverse 0 points  Repeat phrase 0 points  Total Score 0 points    Immunizations Immunization History  Administered Date(s) Administered   Influenza Split 04/06/2013   Influenza,inj,Quad PF,6+ Mos 05/24/2015, 03/16/2016, 03/25/2017, 03/17/2018   Influenza-Unspecified 03/10/2012, 03/31/2014, 03/25/2021, 03/24/2022   Moderna Sars-Covid-2 Vaccination 03/25/2020, 04/15/2020, 07/15/2020   PNEUMOCOCCAL CONJUGATE-20 02/14/2022   Pneumococcal-Unspecified 03/21/2006   Tdap 04/18/2018   Zoster Recombinat (Shingrix) 04/20/2021, 08/18/2021   Zoster, Unspecified 03/14/2021    TDAP status: Up to  date  Flu Vaccine status: Up to date  Pneumococcal vaccine status: Up to date  Covid-19 vaccine status: Information provided on how to obtain vaccines.   Qualifies for Shingles Vaccine? Yes   Zostavax completed No   Shingrix Completed?: No.    Education has been provided regarding the importance of this vaccine. Patient has been advised to call insurance company to determine out of pocket expense if they have not yet received this vaccine. Advised may also receive vaccine at local pharmacy or Health Dept. Verbalized acceptance and understanding.  Screening Tests Health Maintenance  Topic Date Due   COVID-19 Vaccine (3 - Moderna risk series) 12/09/2022 (Originally 08/12/2020)   MAMMOGRAM  03/27/2023   Medicare Annual Wellness (AWV)  06/23/2023   DTaP/Tdap/Td (2 - Td or Tdap) 04/18/2028   COLONOSCOPY (Pts 45-82yrs Insurance coverage will need to be confirmed)  02/09/2032   INFLUENZA VACCINE  Completed   Hepatitis C  Screening  Completed   HIV Screening  Completed   Zoster Vaccines- Shingrix  Completed   HPV VACCINES  Aged Out    Health Maintenance  There are no preventive care reminders to display for this patient.   Colorectal cancer screening: Type of screening: Colonoscopy. Completed 02/08/22. Repeat every 10 years  Mammogram status: Completed 03/26/22. Repeat every year  Lung Cancer Screening: (Low Dose CT Chest recommended if Age 67-80 years, 30 pack-year currently smoking OR have quit w/in 15years.) does not qualify.   Lung Cancer Screening Referral: n/a   Additional Screening:  Hepatitis C Screening: does qualify; Completed 06/14/21  Vision Screening: Recommended annual ophthalmology exams for early detection of glaucoma and other disorders of the eye. Is the patient up to date with their annual eye exam?  Yes  Who is the provider or what is the name of the office in which the patient attends annual eye exams? Dr. Charise Killian  If pt is not established with a provider, would  they like to be referred to a provider to establish care? No .   Dental Screening: Recommended annual dental exams for proper oral hygiene  Community Resource Referral / Chronic Care Management: CRR required this visit?  No   CCM required this visit?  No      Plan:     I have personally reviewed and noted the following in the patient's chart:   Medical and social history Use of alcohol, tobacco or illicit drugs  Current medications and supplements including opioid prescriptions. Patient is currently taking opioid prescriptions. Information provided to patient regarding non-opioid alternatives. Patient advised to discuss non-opioid treatment plan with their provider. Functional ability and status Nutritional status Physical activity Advanced directives List of other physicians Hospitalizations, surgeries, and ER visits in previous 12 months Vitals Screenings to include cognitive, depression, and falls Referrals and appointments  In addition, I have reviewed and discussed with patient certain preventive protocols, quality metrics, and best practice recommendations. A written personalized care plan for preventive services as well as general preventive health recommendations were provided to patient.     Durwin Nora, [place]   [id number]   Due to this being a virtual visit, the after visit summary with patients personalized plan was offered to patient via mail or my-chart. Patient would like to access on my-chart  Nurse Notes: No concerns

## 2022-06-22 NOTE — Patient Instructions (Signed)
Melinda Moore , Thank you for taking time to come for your Medicare Wellness Visit. I appreciate your ongoing commitment to your health goals. Please review the following plan we discussed and let me know if I can assist you in the future.   These are the goals we discussed:  Goals      DIET - REDUCE CALORIE INTAKE     Would like to lose weight in the next year.     Exercise 3x per week (30 min per time)     Increase exercise as tolerated.        This is a list of the screening recommended for you and due dates:  Health Maintenance  Topic Date Due   COVID-19 Vaccine (3 - Moderna risk series) 12/09/2022*   Mammogram  03/27/2023   Medicare Annual Wellness Visit  06/23/2023   DTaP/Tdap/Td vaccine (2 - Td or Tdap) 04/18/2028   Colon Cancer Screening  02/09/2032   Flu Shot  Completed   Hepatitis C Screening: USPSTF Recommendation to screen - Ages 18-79 yo.  Completed   HIV Screening  Completed   Zoster (Shingles) Vaccine  Completed   HPV Vaccine  Aged Out  *Topic was postponed. The date shown is not the original due date.    Advanced directives: Forms are available if you choose in the future to pursue completion.  This is recommended in order to make sure that your health wishes are honored in the event that you are unable to verbalize them to the provider.    Conditions/risks identified: Aim for 30 minutes of exercise or brisk walking, 6-8 glasses of water, and 5 servings of fruits and vegetables each day.   Next appointment: Follow up in one year for your annual wellness visit.   Preventive Care 40-64 Years, Female Preventive care refers to lifestyle choices and visits with your health care provider that can promote health and wellness. What does preventive care include? A yearly physical exam. This is also called an annual well check. Dental exams once or twice a year. Routine eye exams. Ask your health care provider how often you should have your eyes checked. Personal lifestyle  choices, including: Daily care of your teeth and gums. Regular physical activity. Eating a healthy diet. Avoiding tobacco and drug use. Limiting alcohol use. Practicing safe sex. Taking low-dose aspirin daily starting at age 19. Taking vitamin and mineral supplements as recommended by your health care provider. What happens during an annual well check? The services and screenings done by your health care provider during your annual well check will depend on your age, overall health, lifestyle risk factors, and family history of disease. Counseling  Your health care provider may ask you questions about your: Alcohol use. Tobacco use. Drug use. Emotional well-being. Home and relationship well-being. Sexual activity. Eating habits. Work and work Astron[employer]. Method of birth control. Menstrual cycle. Pregnancy history. Screening  You may have the following tests or measurements: Height, weight, and BMI. Blood pressure. Lipid and cholesterol levels. These may be checked every 5 years, or more frequently if you are over 69 years old. Skin check. Lung cancer screening. You may have this screening every year starting at age 90 if you have a 30-pack-year history of smoking and currently smoke or have quit within the past 15 years. Fecal occult blood test (FOBT) of the stool. You may have this test every year starting at age 25. Flexible sigmoidoscopy or colonoscopy. You may have a sigmoidoscopy every 5 years or  a colonoscopy every 10 years starting at age 52. Hepatitis C blood test. Hepatitis B blood test. Sexually transmitted disease (STD) testing. Diabetes screening. This is done by checking your blood sugar (glucose) after you have not eaten for a while (fasting). You may have this done every 1-3 years. Mammogram. This may be done every 1-2 years. Talk to your health care provider about when you should start having regular mammograms. This may depend on whether you have a family  history of breast cancer. BRCA-related cancer screening. This may be done if you have a family history of breast, ovarian, tubal, or peritoneal cancers. Pelvic exam and Pap test. This may be done every 3 years starting at age 41. Starting at age 52, this may be done every 5 years if you have a Pap test in combination with an HPV test. Bone density scan. This is done to screen for osteoporosis. You may have this scan if you are at high risk for osteoporosis. Discuss your test results, treatment options, and if necessary, the need for more tests with your health care provider. Vaccines  Your health care provider may recommend certain vaccines, such as: Influenza vaccine. This is recommended every year. Tetanus, diphtheria, and acellular pertussis (Tdap, Td) vaccine. You may need a Td booster every 10 years. Zoster vaccine. You may need this after age 67. Pneumococcal 13-valent conjugate (PCV13) vaccine. You may need this if you have certain conditions and were not previously vaccinated. Pneumococcal polysaccharide (PPSV23) vaccine. You may need one or two doses if you smoke cigarettes or if you have certain conditions. Talk to your health care provider about which screenings and vaccines you need and how often you need them. This information is not intended to replace advice given to you by your health care provider. Make sure you discuss any questions you have with your health care provider. Document Released: 06/24/2015 Document Revised: 02/15/2016 Document Reviewed: 03/29/2015 Elsevier Interactive Patient Education  2017 [place] Prevention in the Home Falls can cause injuries. They can happen to people of all ages. There are many things you can do to make your home safe and to help prevent falls. What can I do on the outside of my home? Regularly fix the edges of walkways and driveways and fix any cracks. Remove anything that might make you trip as you walk through a door, such  as a raised step or threshold. Trim any bushes or trees on the path to your home. Use bright outdoor lighting. Clear any walking paths of anything that might make someone trip, such as rocks or tools. Regularly check to see if handrails are loose or broken. Make sure that both sides of any steps have handrails. Any raised decks and porches should have guardrails on the edges. Have any leaves, snow, or ice cleared regularly. Use sand or salt on walking paths during winter. Clean up any spills in your garage right away. This includes oil or grease spills. What can I do in the bathroom? Use night lights. Install grab bars by the toilet and in the tub and shower. Do not use towel bars as grab bars. Use non-skid mats or decals in the tub or shower. If you need to sit down in the shower, use a plastic, non-slip stool. Keep the floor dry. Clean up any water that spills on the floor as soon as it happens. Remove soap buildup in the tub or shower regularly. Attach bath mats securely with double-sided non-slip rug  tape. Do not have throw rugs and other things on the floor that can make you trip. What can I do in the bedroom? Use night lights. Make sure that you have a light by your bed that is easy to reach. Do not use any sheets or blankets that are too big for your bed. They should not hang down onto the floor. Have a firm chair that has side arms. You can use this for support while you get dressed. Do not have throw rugs and other things on the floor that can make you trip. What can I do in the kitchen? Clean up any spills right away. Avoid walking on wet floors. Keep items that you use a lot in easy-to-reach places. If you need to reach something above you, use a strong step stool that has a grab bar. Keep electrical cords out of the way. Do not use floor polish or wax that makes floors slippery. If you must use wax, use non-skid floor wax. Do not have throw rugs and other things on the  floor that can make you trip. What can I do with my stairs? Do not leave any items on the stairs. Make sure that there are handrails on both sides of the stairs and use them. Fix handrails that are broken or loose. Make sure that handrails are as long as the stairways. Check any carpeting to make sure that it is firmly attached to the stairs. Fix any carpet that is loose or worn. Avoid having throw rugs at the top or bottom of the stairs. If you do have throw rugs, attach them to the floor with carpet tape. Make sure that you have a light switch at the top of the stairs and the bottom of the stairs. If you do not have them, ask someone to add them for you. What else can I do to help prevent falls? Wear shoes that: Do not have high heels. Have rubber bottoms. Are comfortable and fit you well. Are closed at the toe. Do not wear sandals. If you use a stepladder: Make sure that it is fully opened. Do not climb a closed stepladder. Make sure that both sides of the stepladder are locked into place. Ask someone to hold it for you, if possible. Clearly mark and make sure that you can see: Any grab bars or handrails. First and last steps. Where the edge of each step is. Use tools that help you move around (mobility aids) if they are needed. These include: Canes. Walkers. Scooters. Crutches. Turn on the lights when you go into a dark area. Replace any light bulbs as soon as they burn out. Set up your furniture so you have a clear path. Avoid moving your furniture around. If any of your floors are uneven, fix them. If there are any pets around you, be aware of where they are. Review your medicines with your doctor. Some medicines can make you feel dizzy. This can increase your chance of falling. Ask your doctor what other things that you can do to help prevent falls. This information is not intended to replace advice given to you by your health care provider. Make sure you discuss any questions  you have with your health care provider. Document Released: 03/24/2009 Document Revised: 11/03/2015 Document Reviewed: 07/02/2014 Elsevier Interactive Patient Education  2017 [employer].

## 2022-06-28 NOTE — Progress Notes (Signed)
Pt plans on doing it this weekend.

## 2022-06-29 ENCOUNTER — Ambulatory Visit: Payer: 59 | Admitting: Internal Medicine

## 2022-07-12 DIAGNOSIS — G35 Multiple sclerosis: Secondary | ICD-10-CM | POA: Diagnosis not present

## 2022-07-15 DIAGNOSIS — M899 Disorder of bone, unspecified: Secondary | ICD-10-CM | POA: Insufficient documentation

## 2022-07-15 DIAGNOSIS — Z79899 Other long term (current) drug therapy: Secondary | ICD-10-CM | POA: Insufficient documentation

## 2022-07-15 DIAGNOSIS — Z789 Other specified health status: Secondary | ICD-10-CM | POA: Insufficient documentation

## 2022-07-15 NOTE — Progress Notes (Signed)
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 opioid analgesics, in accordance with [place], we will assess their risks and indications for the use of these substances. After completing our evaluation, we may offer recommendations, but we no longer take patients for medication management. The prescribing physician will ultimately decide, based on his/her training and level of comfort whether to adopt any of the recommendations, including whether or not to prescribe such medicines.  Membrane stabilizer: To be determined at a later time  Muscle relaxant: To be determined at a later time  NSAID: To be determined at a later time  Other analgesic(s): To be determined at a later time   Interventional management options: Ms. Breceda was informed that there is no guarantee that she would be a candidate for interventional therapies. The decision will be  based on the results of diagnostic studies, as well as Ms. Chrystal's risk profile.  Procedure(s) under consideration:  Pending results of ordered studies      Interventional Therapies  Risk Factors  Considerations:     Planned  Pending:   See above for possible orders   Under consideration:   Pending completion of evaluation   Completed:   None at this  time   Completed by other providers:   None at this time   Therapeutic  Palliative (PRN) options:   None established     Provider-requested follow-up: No follow-ups on file.  Future Appointments  Date Time Provider [place]  07/18/2022  2:00 PM Milinda Pointer, MD [facility] None  08/07/2022  3:30 PM Rourk, Cristopher Estimable, MD [facility]    Duration of encounter: *** minutes.  Total time on encounter, as per AMA guidelines included both the face-to-face and non-face-to-face time personally spent by the physician and/or other qualified health care professional(s) on the day of the encounter (includes time in activities that require the physician or other qualified health care professional and does not include time in activities normally performed by clinical staff). Physician's time may include the following activities when performed: Preparing to see the patient (e.g., pre-charting review of records, searching for previously ordered imaging, lab work, and nerve conduction tests) Review of prior analgesic pharmacotherapies. Reviewing PMP Interpreting ordered tests (e.g., lab work, imaging, nerve conduction tests) Performing post-procedure evaluations, including interpretation of diagnostic procedures Obtaining and/or reviewing separately obtained history Performing a medically appropriate examination and/or evaluation Counseling and educating the patient/family/caregiver Ordering medications, tests, or procedures Referring and communicating with other health care professionals (when not separately reported) Documenting  clinical information in the electronic or other health record Independently interpreting results (not separately reported) and communicating results to the patient/ family/caregiver Care coordination (not separately reported)  Note by: Gaspar Cola, MD Date: 07/18/2022; Time: 3:33 PM

## 2022-07-18 ENCOUNTER — Ambulatory Visit
Admission: RE | Admit: 2022-07-18 | Discharge: 2022-07-18 | Disposition: A | Discharge status: Home / Self Care | Payer: 59 | Source: Ambulatory Visit | Attending: Pain Medicine | Admitting: Pain Medicine

## 2022-07-18 ENCOUNTER — Ambulatory Visit (HOSPITAL_BASED_OUTPATIENT_CLINIC_OR_DEPARTMENT_OTHER): Payer: 59 | Admitting: Pain Medicine

## 2022-07-18 ENCOUNTER — Encounter: Payer: Self-pay | Admitting: Pain Medicine

## 2022-07-18 VITALS — BP 123/79 | HR 92 | Temp 98.4°F | Ht 69.0 in | Wt 262.0 lb

## 2022-07-18 DIAGNOSIS — Z79891 Long term (current) use of opiate analgesic: Secondary | ICD-10-CM

## 2022-07-18 DIAGNOSIS — G35 Multiple sclerosis: Secondary | ICD-10-CM

## 2022-07-18 DIAGNOSIS — G894 Chronic pain syndrome: Secondary | ICD-10-CM | POA: Insufficient documentation

## 2022-07-18 DIAGNOSIS — M5137 Other intervertebral disc degeneration, lumbosacral region: Secondary | ICD-10-CM

## 2022-07-18 DIAGNOSIS — G8929 Other chronic pain: Secondary | ICD-10-CM

## 2022-07-18 DIAGNOSIS — M5386 Other specified dorsopathies, lumbar region: Secondary | ICD-10-CM | POA: Insufficient documentation

## 2022-07-18 DIAGNOSIS — M51379 Other intervertebral disc degeneration, lumbosacral region without mention of lumbar back pain or lower extremity pain: Secondary | ICD-10-CM

## 2022-07-18 DIAGNOSIS — E669 Obesity, unspecified: Secondary | ICD-10-CM

## 2022-07-18 DIAGNOSIS — R937 Abnormal findings on diagnostic imaging of other parts of musculoskeletal system: Secondary | ICD-10-CM | POA: Insufficient documentation

## 2022-07-18 DIAGNOSIS — M5459 Other low back pain: Secondary | ICD-10-CM

## 2022-07-18 DIAGNOSIS — M47816 Spondylosis without myelopathy or radiculopathy, lumbar region: Secondary | ICD-10-CM | POA: Insufficient documentation

## 2022-07-18 DIAGNOSIS — M25561 Pain in right knee: Secondary | ICD-10-CM

## 2022-07-18 DIAGNOSIS — M899 Disorder of bone, unspecified: Secondary | ICD-10-CM | POA: Insufficient documentation

## 2022-07-18 DIAGNOSIS — Z96651 Presence of right artificial knee joint: Secondary | ICD-10-CM

## 2022-07-18 DIAGNOSIS — M545 Low back pain, unspecified: Secondary | ICD-10-CM | POA: Insufficient documentation

## 2022-07-18 DIAGNOSIS — Z789 Other specified health status: Secondary | ICD-10-CM

## 2022-07-18 DIAGNOSIS — M4807 Spinal stenosis, lumbosacral region: Secondary | ICD-10-CM

## 2022-07-18 DIAGNOSIS — M79604 Pain in right leg: Secondary | ICD-10-CM

## 2022-07-18 DIAGNOSIS — Z79899 Other long term (current) drug therapy: Secondary | ICD-10-CM | POA: Insufficient documentation

## 2022-07-18 NOTE — Progress Notes (Signed)
Safety precautions to be maintained throughout the outpatient stay will include: orient to surroundings, keep bed in low position, maintain call bell within reach at all times, provide assistance with transfer out of bed and ambulation.  

## 2022-07-18 NOTE — Patient Instructions (Signed)
____________________________________________________________________________________________  New Patients  Welcome to [facility].   Initial Visit The first or initial visit consists of an evaluation only.   Interventional pain management.  We offer therapies other than opioid controlled substances to manage chronic pain. These include, but are not limited to, diagnostic, therapeutic, and palliative specialized injection therapies (i.e.: Epidural Steroids, Facet Blocks, etc.). We specialize in a variety of nerve blocks as well as radiofrequency treatments. We offer pain implant evaluations and trials, as well as follow up management. In addition we also provide a variety joint injections, including Viscosupplementation (AKA: Gel Therapy).  Prescription Pain Medication We provide evaluations for/of pharmacologic therapies. Recommendations will follow CDC Guidelines.  We no longer take patients for long-term medication management. We will not be taking over your pain medications.  ____________________________________________________________________________________________    ____________________________________________________________________________________________  Patient Information update  To: All of our patients.  Re: Name change.  It has been made official that our current name, "[facility]"   will soon be changed to "[facility]".   The purpose of this change is to eliminate any confusion created by the concept of our practice being a "Medication Management Pain Clinic". In the past this has led to the misconception that we treat pain primarily by the use of prescription medications.  Nothing can be farther from the truth.   Understanding PAIN MANAGEMENT: To further understand what our practice does, you  first have to understand that "Pain Management" is a subspecialty that requires additional training once a physician has completed their specialty training, which can be in either Anesthesia, Neurology, Psychiatry, or Physical Medicine and Rehabilitation (PMR). Each one of these contributes to the final approach taken by each physician to the management of their patient's pain. To be a "Pain Management Specialist" you must have first completed one of the specialty trainings below.  Anesthesiologists - trained in clinical pharmacology and interventional techniques such as nerve blockade and regional as well as central neuroanatomy. They are trained to block pain before, during, and after surgical interventions.  Neurologists - trained in the diagnosis and pharmacological treatment of complex neurological conditions, such as Multiple Sclerosis, Parkinson's, spinal cord injuries, and other systemic conditions that may be associated with symptoms that may include but are not limited to pain. They tend to rely primarily on the treatment of chronic pain using prescription medications.  Psychiatrist - trained in conditions affecting the psychosocial wellbeing of patients including but not limited to depression, anxiety, schizophrenia, personality disorders, addiction, and other substance use disorders that may be associated with chronic pain. They tend to rely primarily on the treatment of chronic pain using prescription medications.   Physical Medicine and Rehabilitation (PMR) physicians, also known as physiatrists - trained to treat a wide variety of medical conditions affecting the brain, spinal cord, nerves, bones, joints, ligaments, muscles, and tendons. Their training is primarily aimed at treating patients that have suffered injuries that have caused severe physical impairment. Their training is primarily aimed at the physical therapy and rehabilitation of those patients. They may also work alongside  orthopedic surgeons or neurosurgeons using their expertise in assisting surgical patients to recover after their surgeries.  INTERVENTIONAL PAIN MANAGEMENT is sub-subspecialty of Pain Management.  Our physicians are Board-certified in Anesthesia, Pain Management, and Interventional Pain Management.  This meaning that not only have they been trained and Board-certified in their specialty of Anesthesia, and  subspecialty of Pain Management, but they have also received further training in the sub-subspecialty of Interventional Pain Management, in order to become Board-certified as INTERVENTIONAL PAIN MANAGEMENT SPECIALIST.    Mission: Our goal is to use our skills in  [place] as alternatives to the chronic use of prescription opioid medications for the treatment of pain. To make this more clear, we have changed our name to reflect what we do and offer. We will continue to offer medication management assessment and recommendations, but we will not be taking over any patient's medication management.  ____________________________________________________________________________________________

## 2022-07-19 ENCOUNTER — Encounter: Payer: Self-pay | Admitting: Pain Medicine

## 2022-07-23 ENCOUNTER — Encounter: Payer: Self-pay | Admitting: Family Medicine

## 2022-07-30 DIAGNOSIS — M5416 Radiculopathy, lumbar region: Secondary | ICD-10-CM | POA: Diagnosis not present

## 2022-08-01 ENCOUNTER — Ambulatory Visit: Payer: 59 | Admitting: Pain Medicine

## 2022-08-06 ENCOUNTER — Encounter: Payer: Self-pay | Admitting: Family Medicine

## 2022-08-06 ENCOUNTER — Other Ambulatory Visit: Payer: Self-pay | Admitting: Family Medicine

## 2022-08-07 ENCOUNTER — Ambulatory Visit: Payer: 59 | Admitting: Internal Medicine

## 2022-08-15 ENCOUNTER — Ambulatory Visit (INDEPENDENT_AMBULATORY_CARE_PROVIDER_SITE_OTHER): Payer: 59 | Admitting: Family Medicine

## 2022-08-15 DIAGNOSIS — G8929 Other chronic pain: Secondary | ICD-10-CM | POA: Diagnosis not present

## 2022-08-15 DIAGNOSIS — G35 Multiple sclerosis: Secondary | ICD-10-CM | POA: Diagnosis not present

## 2022-08-15 DIAGNOSIS — M4807 Spinal stenosis, lumbosacral region: Secondary | ICD-10-CM

## 2022-08-15 DIAGNOSIS — M5416 Radiculopathy, lumbar region: Secondary | ICD-10-CM | POA: Diagnosis not present

## 2022-08-15 DIAGNOSIS — Z79891 Long term (current) use of opiate analgesic: Secondary | ICD-10-CM

## 2022-08-15 MED ORDER — HYDROMORPHONE HCL 2 MG PO TABS: ORAL_TABLET | ORAL | 0 refills | Status: DC

## 2022-08-15 NOTE — Progress Notes (Signed)
Subjective:    Patient ID: Melinda Moore, female    DOB: 02/06/1971, 52 y.o.   MRN: [id number]  HPI This patient was seen today for chronic pain  The medication list was reviewed and updated.  Location of Pain for which the patient has been treated with regarding narcotics: She has pain in the lumbar spine area.  She also has pain from her MS.  Pain going down the legs as well  Onset of this pain: Present for years   -Compliance with medication: Good compliance with medicine  - Number patient states they take daily: 4 mg tablets, she takes for them per day  -when was the last dose patient took? 0630 this morning  The patient was advised the importance of maintaining medication and not using illegal substances with these.  Here for refills and follow up  The patient was educated that we can provide 3 monthly scripts for their medication, it is their responsibility to follow the instructions.  Side effects or complications from medications: Denies side effects does not have drowsiness with it states it does help control her pain  Patient is aware that pain medications are meant to minimize the severity of the pain to allow their pain levels to improve to allow for better function. They are aware of that pain medications cannot totally remove their pain.  Due for UDT ( at least once per year) : 02/14/2022  Scale of 1 to 10 ( 1 is least 10 is most) Your pain level without the medicine: 8 Your pain level with medication 5  Scale 1 to 10 ( 1-helps very little, 10 helps very well) How well does your pain medication reduce your pain so you can function better through out the day? 7  Quality of the pain: Aching throbbing and burning  Persistence of the pain: Present all the time  Modifying factors: Worse with activity Patient did do urine drug screen in September        Review of Systems     Objective:   Physical Exam  General-in no acute distress Eyes-no  discharge Lungs-respiratory rate normal, CTA CV-no murmurs,RRR Extremities skin warm dry no edema Neuro grossly normal Behavior normal, alert Subjective discomfort in her back      Assessment & Plan:  Chronic low back pain Degenerative disc disease Encounter for opioid follow-up   Very difficult situation.  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  Very difficult to get medications.  Currently Dilaudid is out.  I did discuss with her potentially substituting hydrocodone 10 mg maximum 6/day Currently her pharmacy has 2 mg Dilaudid tablets 73 of the tablets so therefore we will write it for 72 tablets I have also encouraged patient to try to cut back to utilizing 14 mg a day rather than 60 mg maximum cut back would be 12 mg/day we did discuss how to safely do that.  This would allow the current dosing to last longer  She will maintain contact with the pharmacy to find out if they will get Dilaudid 4 mg tablets in stock or if they will get the hydrocodone she will let us know but currently right now her pharmacy does not allow for electronic prescribing because there EMR was compromised by computer virus  30 minutes spent with patient today discussing pain medicine discussing how medicines can be changed from 1 type of pain medicine to another We also discussed how if she is interested in seeing a pain management doctor to  manage the medicines we are more than happy to do that.  Currently she would like for [place] to manage her medicines and for her specialist at [facility] to do interventional pain management. We also discussed with her that if she was ever interested in tapering the dose of the pain medicine if none gradually this could be done safely and I have encouraged her to consider going to a lower dose hopefully once she gets the procedures done she will see better pain control and be able to reduce her Dilaudid safely  She will give [place] feedback within the next 10 to 12 days how this  is done because of may have to write her an additional prescription follow-up in 3 months follow-up sooner problems

## 2022-08-16 ENCOUNTER — Ambulatory Visit: Payer: 59 | Admitting: Family Medicine

## 2022-08-21 ENCOUNTER — Ambulatory Visit: Payer: 59 | Admitting: Internal Medicine

## 2022-08-22 ENCOUNTER — Encounter: Payer: Self-pay | Admitting: Family Medicine

## 2022-08-23 ENCOUNTER — Other Ambulatory Visit: Payer: Self-pay | Admitting: Family Medicine

## 2022-08-23 MED ORDER — HYDROMORPHONE HCL 4 MG PO TABS: ORAL_TABLET | ORAL | 0 refills | Status: DC

## 2022-08-23 NOTE — Telephone Encounter (Signed)
Nurses-I sent in pain prescriptions that will cover her through June 5 Please move her pain management appointment from June 6 2 preferably last part of May or first part of June before June 4 thank you

## 2022-08-25 ENCOUNTER — Telehealth: Payer: Self-pay | Admitting: Family Medicine

## 2022-08-25 NOTE — Telephone Encounter (Signed)
Nurses We recently received notification regarding some sort of insurance issue regarding disability benefits with the patient.  They are requesting specific information regarding her ability to work.  This requires a office visit or a virtual visit to handle this.  We can do this anywhere in the next couple weeks.  Please keep this form at the nurses station to be our guide for that visit  You can let the patient know specifically they are requesting information regarding her ability to do sedentary work on a full-time basis

## 2022-08-29 ENCOUNTER — Encounter: Payer: Self-pay | Admitting: Family Medicine

## 2022-08-29 DIAGNOSIS — M549 Dorsalgia, unspecified: Secondary | ICD-10-CM

## 2022-08-29 NOTE — Telephone Encounter (Signed)
Patient notified via Mychart.

## 2022-08-30 NOTE — Telephone Encounter (Signed)
Patient to call office and make appointment.

## 2022-08-30 NOTE — Telephone Encounter (Signed)
Nurses-physical therapy referral would be fine to [facility] physical therapy on scale history

## 2022-08-31 NOTE — Addendum Note (Signed)
Addended by: Orvan Seen on: 08/31/2022 10:35 AM   Modules accepted: Orders

## 2022-09-18 ENCOUNTER — Ambulatory Visit (INDEPENDENT_AMBULATORY_CARE_PROVIDER_SITE_OTHER): Payer: 59 | Admitting: Internal Medicine

## 2022-09-18 ENCOUNTER — Other Ambulatory Visit: Payer: Self-pay | Admitting: *Deleted

## 2022-09-18 VITALS — BP 112/76 | HR 94 | Temp 97.5°F | Ht 69.0 in | Wt 283.6 lb

## 2022-09-18 DIAGNOSIS — K5900 Constipation, unspecified: Secondary | ICD-10-CM | POA: Diagnosis not present

## 2022-09-18 DIAGNOSIS — R110 Nausea: Secondary | ICD-10-CM

## 2022-09-18 DIAGNOSIS — Z1211 Encounter for screening for malignant neoplasm of colon: Secondary | ICD-10-CM | POA: Diagnosis not present

## 2022-09-18 DIAGNOSIS — K219 Gastro-esophageal reflux disease without esophagitis: Secondary | ICD-10-CM | POA: Diagnosis not present

## 2022-09-18 MED ORDER — ONDANSETRON HCL 8 MG PO TABS: 8.0000 mg | ORAL_TABLET | Freq: Two times a day (BID) | ORAL | 11 refills | Status: DC | PRN

## 2022-09-18 NOTE — Patient Instructions (Addendum)
It was good to see you again today!  GERD information provided  Glad that Symproic is managing your constipation better than other agents.  We will continue that regimen  Continue Protonix 40 mg 30 minutes before breakfast and supper.  Add Pepcid AC (over-the-counter) 1 to 2 tablets twice daily for breakthrough reflux symptoms  Might want to try reflux Gourmet ([employer]).  This will help hold down    acid reflux after you eat a meal.  As discussed, I would hope you could lose most of the 30 pounds you have gained over the past 6 months.  I do not recommend trying to lose it too fast.  Would actually take 12 months to get it off.  This will help you overall.  Continue Zofran 8 mg twice daily as needed for nausea.  Dispense 60 with 11 refills.   Please complete Cologuard testing  Office visit here in 6 months

## 2022-09-18 NOTE — Progress Notes (Signed)
Primary Care Physician:  Babs Sciara, MD Primary Gastroenterologist:  Dr. Jena Gauss  Pre-Procedure History & Physical: HPI:  Melinda Moore is a 52 y.o. female here for follow-up of GERD, constipation. Our team has made Herculean effort to complete colonoscopy.  Opioid-induced constipation, poly pharmacy has thwarted our efforts to obtain an adequate preparation on multiple occasions.  We recommended Cologuard.  She has not completed that testing as of yet.  Gained 30 pounds since September of last year.  Reflux breaking through almost every day.  No dysphagia.  On Protonix twice daily  However , Symproic has been very effective in managing her constipation.  Continues to need Zofran on occasion she does have regurgitation/reflux multiple times weekly.  Past Medical History:  Diagnosis Date   Anemia    Anxiety    Bell's palsy    Depression    DVT (deep venous thrombosis) (HCC) 09/02/2012   [place] on 02/13/2012 showed acute DVT in left calf veins and posterior tibial veins   Fibromyalgia    GERD (gastroesophageal reflux disease)    History of blood transfusion 2000   History of trigeminal neuralgia    HTN (hypertension)    patient denies was taking a blood pressure medication in early 2000 but it was migraines   Leukopenia    MS (multiple sclerosis) (HCC) 1997   Neuropathic pain    Peripheral edema    Port catheter in place 12/31/2012   Right bundle branch block     Past Surgical History:  Procedure Laterality Date   ABDOMINAL HYSTERECTOMY     CHOLECYSTECTOMY     COLONOSCOPY WITH PROPOFOL N/A 09/27/2021   Procedure: COLONOSCOPY WITH PROPOFOL;  Surgeon: Corbin Ade, MD;  [facility];  Service: Endoscopy;  Laterality: N/A;  8:00am, asa 2   ESOPHAGOGASTRODUODENOSCOPY  2011   Dr. Jena Gauss. 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 Biopsy from the stomach revealed minimal chronic  inactive inflammation but negative for H. pylori   FLEXIBLE SIGMOIDOSCOPY N/A 02/08/2022   Procedure: FLEXIBLE SIGMOIDOSCOPY;  Surgeon: Corbin Ade, MD;  [facility];  Service: Endoscopy;  Laterality: N/A;   LAPAROSCOPIC GASTRIC SLEEVE RESECTION N/A 04/17/2016   Procedure: LAPAROSCOPIC GASTRIC SLEEVE RESECTION WITH UPPER ENDOSCOPY;  Surgeon: Glenna Fellows, MD;  Location: [facility];  Service: General;  Laterality: N/A;   PATELLA-FEMORAL ARTHROPLASTY Right 07/14/2019   Procedure: PATELLA-FEMORAL ARTHROPLASTY;  Surgeon: Teryl Lucy, MD;  Location: [facility];  Service: Orthopedics;  Laterality: Right;   PORTACATH PLACEMENT     TUBAL LIGATION  2001    Prior to Admission medications   Medication Sig Start Date End Date Taking? Authorizing Provider  ascorbic acid (VITAMIN C) 500 MG tablet Take 1 tablet (500 mg total) by mouth daily. 02/15/20  Yes Johnson, Clanford L, MD  baclofen (LIORESAL) 10 MG tablet Take 10 mg by mouth 2 (two) times daily. 03/09/22  Yes [provider]  Bioflavonoid Products (BIOFLEX) TABS Take 2 tablets by mouth daily.   Yes [provider]  buPROPion (WELLBUTRIN SR) 150 MG 12 hr tablet Take 1 tablet (150 mg total) by mouth 2 (two) times daily. 02/14/22  Yes Babs Sciara, MD  carbamazepine (TEGRETOL XR) 200 MG 12 hr tablet Take 1 tablet (200 mg total) by mouth 3 (three) times daily as needed. 06/14/21  Yes Ameduite, Alvino Chapel, FNP  Cholecalciferol (VITAMIN D3) 125 MCG (5000 UT) TABS Take 20,000  Units by mouth 2 (two) times daily.   Yes [provider]  gabapentin (NEURONTIN) 300 MG capsule Take 1,200 mg by mouth 3 (three) times daily. 02/04/21  Yes [provider]  HYDROmorphone (DILAUDID) 4 MG tablet 1 tablet po qid prn, max use per day is 4 tablets 08/23/22  Yes Luking, Scott A, MD  HYDROmorphone (DILAUDID) 4 MG tablet 1 tablet po qid prn, max use per day is 4 tablets. 08/23/22  Yes Babs Sciara, MD  Multiple Vitamin (MULTIVITAMIN  WITH MINERALS) TABS tablet Take 1 tablet by mouth daily. ALIVE   Yes [provider]  Multiple Vitamins-Minerals (EMERGEN-C IMMUNE) PACK Take 1 packet by mouth once a week.   Yes [provider]  Naldemedine Tosylate (SYMPROIC) 0.2 MG TABS Take 0.2 mg by mouth daily. 03/30/22  Yes Lakira Ogando, Gerrit Friends, MD  naloxone [facility]) 4 MG/0.1ML LIQD nasal spray kit SPRAY INTO NOSE AS DIRECTED FOR ACCIDENTAL OVERDOSE 05/19/22  Yes Luking, Jonna Coup, MD  Ofatumumab (KESIMPTA) 20 MG/0.4ML SOAJ Inject into the skin. 10/09/21  Yes [provider]  ondansetron (ZOFRAN) 8 MG tablet Take 1 tablet (8 mg total) by mouth 2 (two) times daily as needed for nausea or vomiting. 02/14/22  Yes Babs Sciara, MD  pantoprazole (PROTONIX) 40 MG tablet TAKE ONE TABLET BY MOUTH TWICE A DAY BEFORE A MEAL 05/08/22  Yes Harli Engelken, Gerrit Friends, MD  potassium chloride SA (KLOR-CON M) 20 MEQ tablet 1 qd 02/14/22  Yes Luking, Jonna Coup, MD  sertraline (ZOLOFT) 50 MG tablet TAKE ONE-HALF TABLET BY MOUTH TWICE A DAY 01/23/22  Yes Ameduite, Alvino Chapel, FNP  torsemide (DEMADEX) 20 MG tablet Take 1/2 to 1  tablet po q am prn pedal edema 02/14/22  Yes Luking, Scott A, MD  zinc sulfate 220 (50 Zn) MG capsule Take 1 capsule (220 mg total) by mouth daily. 02/15/20  Yes Johnson, Clanford L, MD  linaclotide (LINZESS) 145 MCG CAPS capsule Take 1 capsule (145 mcg total) by mouth daily before breakfast. Patient not taking: Reported on 09/18/2022 10/31/21   Aida Raider, NP    Allergies as of 09/18/2022 - Review Complete 09/18/2022  Allergen Reaction Noted   Ambien [zolpidem tartrate]  02/08/2014   Tape Itching and Rash 03/26/2011    Family History  Problem Relation Age of Onset   Healthy Mother    Heart disease Father    Hyperlipidemia Father    Congestive Heart Failure Father    Hypertension Sister    Colon polyps Sister    Colon polyps Sister    Colon polyps Sister    Colon polyps Sister    Cirrhosis Brother        etoh   Healthy  Daughter    Healthy Daughter    Colon cancer Paternal Uncle     Social History   Socioeconomic History   Marital status: Married    Spouse name: Viviann Spare   Number of children: 2   Years of education: some college   Highest education level: Not on file  Occupational History   Occupation: disabled  Tobacco Use   Smoking status: Former    Packs/day: .25    Types: Cigarettes    Quit date: 1990    Years since quitting: 34.2   Smokeless tobacco: Never   Tobacco comments:    4 a day, quit in 1990  Vaping Use   Vaping Use: Never used  Substance and Sexual Activity   Alcohol use: No  Drug use: No   Sexual activity: Not Currently    Birth control/protection: Surgical  Other Topics Concern   Not on file  Social History Narrative   Lives at home with husband and 2 daughters. 3 grand daughters.    Right handed   Takes 1/2 caffeine pill per day   Social Determinants of Health   Financial Resource Strain: Low Risk  (06/22/2022)   Overall Financial Resource Strain (CARDIA)    Difficulty of Paying Living Expenses: Not hard at all  Food Insecurity: No Food Insecurity (06/22/2022)   Hunger Vital Sign    Worried About Running Out of Food in the Last Year: Never true    Ran Out of Food in the Last Year: Never true  Transportation Needs: No Transportation Needs (06/22/2022)   PRAPARE - Administrator, Civil Service (Medical): No    Lack of Transportation (Non-Medical): No  Physical Activity: Inactive (06/22/2022)   Exercise Vital Sign    Days of Exercise per Week: 0 days    Minutes of Exercise per Session: 0 min  Stress: No Stress Concern Present (06/22/2022)   [employer] of Occupational Health - Occupational Stress Questionnaire    Feeling of Stress : Not at all  Social Connections: Socially Integrated (06/22/2022)   Social Connection and Isolation Panel [NHANES]    Frequency of Communication with Friends and Family: More than three times a week    Frequency of  Social Gatherings with Friends and Family: Three times a week    Attends Religious Services: 1 to 4 times per year    Active Member of Clubs or Organizations: No    Attends Banker Meetings: 1 to 4 times per year    Marital Status: Married  Catering manager Violence: Not At Risk (06/22/2022)   Humiliation, Afraid, Rape, and Kick questionnaire    Fear of Current or Ex-Partner: No    Emotionally Abused: No    Physically Abused: No    Sexually Abused: No    Review of Systems: See HPI, otherwise negative ROS  Physical Exam: BP 112/76 (BP Location: Left Arm, Patient Position: Sitting, Cuff Size: Large)   Pulse 94   Temp (!) 97.5 F (36.4 C) (Temporal)   Ht  (1.753 m)   Wt 283 lb 9.6 oz (128.6 kg)   SpO2 94%   BMI 41.88 kg/m  General:   Alert,  Well-developed, well-nourished, pleasant and cooperative in NAD Impression/Plan: 52 year old morbidly obese lady with chronic opioid induced constipation.  Symproic seems to be working very well for this nice lady.  Inability to prep her colon on multiple attempts as thwarted efforts of colorectal cancer screening.  To follow back mutually agreed upon was Cologuard.  She has just put off returning the kit.  She is going to do so in the near future.  Worsening GERD in the setting of a 30 pound weight loss on top of pre-existing morbid obesity.  Discussed how this undermines efforts at treating GERD and exacerbates the condition.  Recommendations:  GERD information provided  Glad that Symproic is managing  constipation better than other agents.  We will continue that regimen  Continue Protonix 40 mg 30 minutes before breakfast and supper.  Add Pepcid AC (over-the-counter) 1 to 2 tablets twice daily for breakthrough reflux symptoms  Might want to try reflux Gourmet ([employer]).  This will help hold down acid reflux -take after you eat a meal.  As discussed, I recommend losing most of  the 30 pounds gained over the past 6  months.  I do not recommend trying to lose it too fast.  Would actually take 12 months to get it off.    Continue Zofran 8 mg twice daily as needed for nausea.  Dispense 60 with 11 refills.  Please complete Cologuard testing  Office visit here in 6 months     Notice: This dictation was prepared with Dragon dictation along with smaller phrase technology. Any transcriptional errors that result from this process are unintentional and may not be corrected upon review.

## 2022-09-20 ENCOUNTER — Ambulatory Visit (HOSPITAL_COMMUNITY): Payer: 59 | Attending: Family Medicine

## 2022-09-20 ENCOUNTER — Other Ambulatory Visit: Payer: Self-pay

## 2022-09-20 ENCOUNTER — Ambulatory Visit (INDEPENDENT_AMBULATORY_CARE_PROVIDER_SITE_OTHER): Payer: 59 | Admitting: Family Medicine

## 2022-09-20 VITALS — BP 126/81 | HR 101 | Wt 283.2 lb

## 2022-09-20 DIAGNOSIS — R2689 Other abnormalities of gait and mobility: Secondary | ICD-10-CM | POA: Insufficient documentation

## 2022-09-20 DIAGNOSIS — M549 Dorsalgia, unspecified: Secondary | ICD-10-CM | POA: Insufficient documentation

## 2022-09-20 DIAGNOSIS — G35 Multiple sclerosis: Secondary | ICD-10-CM | POA: Insufficient documentation

## 2022-09-20 DIAGNOSIS — R262 Difficulty in walking, not elsewhere classified: Secondary | ICD-10-CM | POA: Diagnosis not present

## 2022-09-20 DIAGNOSIS — M6281 Muscle weakness (generalized): Secondary | ICD-10-CM

## 2022-09-20 DIAGNOSIS — R5383 Other fatigue: Secondary | ICD-10-CM | POA: Diagnosis not present

## 2022-09-20 DIAGNOSIS — M545 Low back pain, unspecified: Secondary | ICD-10-CM | POA: Diagnosis not present

## 2022-09-20 DIAGNOSIS — M791 Myalgia, unspecified site: Secondary | ICD-10-CM

## 2022-09-20 NOTE — Therapy (Signed)
OUTPATIENT PHYSICAL THERAPY THORACOLUMBAR EVALUATION   Patient Name: Melinda Moore MRN: [id number] DOB:Sep 20, 1970, 52 y.o., female Today's Date: 09/20/2022  END OF SESSION:  PT End of Session - 09/20/22 1434     Visit Number 1    Number of Visits 8    Date for PT Re-Evaluation 10/18/22    Authorization Type U[employer]    Authorization - Number of Visits 60    PT Start Time 1435    PT Stop Time 1510    PT Time Calculation (min) 35 min    Activity Tolerance Patient tolerated treatment well    Behavior During Therapy WFL for tasks assessed/performed             Past Medical History:  Diagnosis Date   Anemia    Anxiety    Bell's palsy    Depression    DVT (deep venous thrombosis) 09/02/2012   [place] on 02/13/2012 showed acute DVT in left calf veins and posterior tibial veins   Fibromyalgia    GERD (gastroesophageal reflux disease)    History of blood transfusion 2000   History of trigeminal neuralgia    HTN (hypertension)    patient denies was taking a blood pressure medication in early 2000 but it was migraines   Leukopenia    MS (multiple sclerosis) 1997   Neuropathic pain    Peripheral edema    Port catheter in place 12/31/2012   Right bundle branch block    Past Surgical History:  Procedure Laterality Date   ABDOMINAL HYSTERECTOMY     CHOLECYSTECTOMY     COLONOSCOPY WITH PROPOFOL N/A 09/27/2021   Procedure: COLONOSCOPY WITH PROPOFOL;  Surgeon: Corbin Ade, MD;  [facility];  Service: Endoscopy;  Laterality: N/A;  8:00am, asa 2   ESOPHAGOGASTRODUODENOSCOPY  2011   Dr. Jena Gauss. 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 Biopsy from the stomach revealed minimal chronic inactive inflammation but negative for H. pylori   FLEXIBLE SIGMOIDOSCOPY N/A 02/08/2022   Procedure: FLEXIBLE SIGMOIDOSCOPY;  Surgeon: Corbin Ade, MD;  [facility];  Service: Endoscopy;   Laterality: N/A;   LAPAROSCOPIC GASTRIC SLEEVE RESECTION N/A 04/17/2016   Procedure: LAPAROSCOPIC GASTRIC SLEEVE RESECTION WITH UPPER ENDOSCOPY;  Surgeon: Glenna Fellows, MD;  Location: [facility];  Service: General;  Laterality: N/A;   PATELLA-FEMORAL ARTHROPLASTY Right 07/14/2019   Procedure: PATELLA-FEMORAL ARTHROPLASTY;  Surgeon: Teryl Lucy, MD;  Location: [facility];  Service: Orthopedics;  Laterality: Right;   PORTACATH PLACEMENT     TUBAL LIGATION  2001   Patient Active Problem List   Diagnosis Date Noted   Chronic low back pain (1ry area of Pain) (Bilateral) (R>L) w/o sciatica 07/18/2022   Lumbar facet arthropathy (Bilateral) 07/18/2022   Lumbar facet joint pain 07/18/2022   Chronic lower extremity pain (2ry area of Pain) (Right) 07/18/2022   DDD (degenerative disc disease), lumbosacral 07/18/2022   Abnormal MRI, lumbar spine (10/11/2017) 07/18/2022   Lumbosacral foraminal stenosis (Left: L5-S1) 07/18/2022   Obesity, Class II, BMI 35-39.9 07/18/2022   Osteoarthritis of facet joint of lumbar spine 07/18/2022   Primary osteoarthritis of lumbar spine 07/18/2022   History of total knee replacement (Right) 07/18/2022   Chronic knee pain s/p TKR (Right) 07/18/2022   Chronic medial knee pain (Right) 07/18/2022   Decreased range of motion of lumbar spine 07/18/2022   Pharmacologic therapy 07/15/2022   Disorder of skeletal system 07/15/2022   Problems influencing health  status 07/15/2022   Gastroesophageal reflux disease 09/07/2021   Colon cancer screening 09/07/2021   Hx of hysterectomy 06/14/2021   Elevated transaminase level 02/04/2020   Patellofemoral arthritis of knee (Right) 07/14/2019   History of DVT in adulthood 06/23/2019   Gait abnormality 03/12/2017   Radiculopathy due to lumbar intervertebral disc disorder 09/21/2016   Spondylosis without myelopathy or radiculopathy, lumbar region 09/21/2016   History of anxiety 09/08/2016   Primary osteoarthritis of knee (Right)  08/30/2016   Primary insomnia 08/30/2016   Myofascial pain 08/30/2016   Sciatica, right side 12/12/2015   Morbid obesity 08/12/2015   Chronic anxiety 07/08/2013   Swelling of breast 04/08/2013   Chronic pain syndrome 03/11/2013   Port catheter in place 12/31/2012   Constipation 10/01/2012   Anemia 10/01/2012   Rectal bleeding 10/01/2012   Elevated alkaline phosphatase level 10/01/2012   MS (multiple sclerosis) 09/02/2012   Anxiety 11/29/2011   Headache(784.0) 11/29/2011   ANEMIA 04/12/2010   Other dysphagia 04/12/2010    PCP: Lilyan Punt, MD  REFERRING PROVIDER: Babs Sciara, MD  REFERRING DIAG: M54.9 (ICD-10-CM) - Back pain, unspecified back location, unspecified back pain laterality, unspecified chronicity  Rationale for Evaluation and Treatment: Rehabilitation  THERAPY DIAG:  Low back pain, unspecified back pain laterality, unspecified chronicity, unspecified whether sciatica present  Muscle weakness (generalized)  Difficulty in walking, not elsewhere classified  Multiple sclerosis  Other abnormalities of gait and mobility  ONSET DATE: worse since 2017   SUBJECTIVE:                                                                                                                                                                                           SUBJECTIVE STATEMENT: Diagnosis with MS years ago; sees neurologist in [facility] ; has generally done well but in 2017 started having more issues; after TKA and COVD " I just haven't bounced back"; reports stiffness, back pain and leg pain; MS has affected Right side more.  Pt accompanied by: self   PERTINENT HISTORY:   seeing pain management at [facility], Dr. Jola Schmidt for injections LBP;  TKA 2021 R ; then COVD shortly at the end of 2021  x 2 week in the hospital Also had some injections in the back per Dr. Murray Hodgkins that did help some   PAIN:  Are you having pain? Yes: NPRS scale: 7/10 Pain location:  Right leg Pain description: constant throbbing Aggravating factors: lots of activity Relieving factors: medication   PAIN:  Are you having pain? Yes: NPRS scale: 6/10 Pain location: low back and right leg Pain description: throbbing and nagging aching; constant Aggravating  factors: activity Relieving factors: rest, injections  PRECAUTIONS: Fall  WEIGHT BEARING RESTRICTIONS: No  FALLS:  Has patient fallen in last 6 months? No  OCCUPATION: not working  PLOF: Independent with basic ADLs  PATIENT GOALS: get off some of these pain meds  NEXT MD VISIT: 10/04/22 Dr. Hetty Ely  OBJECTIVE:   DIAGNOSTIC FINDINGS:  CLINICAL DATA:  Low back pain. Chronic bilateral low back pain without sciatica. Lumbar facet arthropathy. Lumbar facet joint pain. Degenerative disc disease.   EXAM: LUMBAR SPINE - COMPLETE WITH BENDING VIEWS   COMPARISON:  Lumbar MRI 10/11/2017   FINDINGS: AP, bilateral oblique, lateral, lateral flexion, lateral extension and lateral L5-S1 spot views obtained. 5 non-rib-bearing lumbar vertebra with tiny ribs at L1 as before.   There is 4-5 mm of anterolisthesis of L4 on L5 present on flexion and extension, this is 0 mm on neutral imaging. Alignment is otherwise normal. Vertebral body heights are normal. There is mild L3-L4 and L4-L5 disc space narrowing and spurring. Mild L3-L4, moderate at L4-L5 and mild L5-S1 facet hypertrophy. No evidence of fracture focal bone abnormalities. The sacroiliac joints are congruent.   IMPRESSION: 1. Degenerative disc disease and facet arthropathy at L3-L4 and L4-L5. 2. Grade 1 anterolisthesis of L4 on L5 measuring 4-5 mm on flexion and extension, 0 mm on neutral imaging.     Electronically Signed   By: Narda Rutherford M.D.   On: 07/20/2022 15:18    PATIENT SURVEYS:  FOTO 52  COGNITION: Overall cognitive status: Within functional limits for tasks assessed     SENSATION: Neuropathy both fee  POSTURE: rounded  shoulders, forward head, and flexed trunk   PALPATION:   LUMBAR ROM:   AROM eval  Flexion 60% available  Extension Unable to get to neutral  Right lateral flexion   Left lateral flexion   Right rotation   Left rotation    (Blank rows = not tested)   LOWER EXTREMITY MMT:    MMT Right eval Left eval  Hip flexion 4 4  Hip extension 2 2  Hip abduction    Hip adduction    Hip internal rotation    Hip external rotation    Knee flexion 3- 3-  Knee extension 4 4  Ankle dorsiflexion 4+ 4+  Ankle plantarflexion    Ankle inversion    Ankle eversion     (Blank rows = not tested)   FUNCTIONAL TESTS:  5 times sit to stand: 18.18 sec using arm to push up on thighs.  2 minute walk test: 224 ft (had to stop before time was up; about 36 sec less)  GAIT: Distance walked: 224 ft Assistive device utilized: None Level of assistance: Modified independence Comments: forward flexed trunk; increased back pain  TODAY'S TREATMENT:                                                                                                                              DATE: 09/20/22  physical therapy evaluation and HEP instruction    PATIENT EDUCATION:  Education details: Patient educated on exam findings, POC, scope of PT, HEP, and what to expect next visit. Person educated: Patient Education method: Explanation, Demonstration, and Handouts Education comprehension: verbalized understanding, returned demonstration, verbal cues required, and tactile cues required  HOME EXERCISE PROGRAM: Access Code: [email or web address] URL: [email or web address] Date: 09/20/2022 Prepared by: [facility]  Exercises - Static Prone on Elbows  - 2 x daily - 7 x weekly - 1 sets - 1 reps - 3 min hold - Prone Press Up  - 2 x daily - 7 x weekly - 1 sets - 5 reps - Prone Gluteal Sets  - 2 x daily - 7 x weekly - 1 sets - 10 reps - 5" hold - Supine Bridge  - 2 x daily - 7 x weekly - 3 sets - 10  reps -ASSESSMENT:  CLINICAL IMPRESSION: Patient is a 52 y.o. female who was seen today for physical therapy evaluation and treatment for low back pain that radiates down right leg.  Patient  presents to physical therapy with complaint of back pain and leg and back weakness. Patient demonstrates muscle weakness, reduced ROM, and fascial restrictions which are likely contributing to symptoms of pain and are negatively impacting patient ability to perform ADLs and functional mobility tasks. Patient will benefit from skilled physical therapy services to address these deficits to reduce pain and improve level of function with ADLs and functional mobility tasks.    OBJECTIVE IMPAIRMENTS: Abnormal gait, decreased activity tolerance, decreased mobility, difficulty walking, decreased ROM, decreased strength, hypomobility, increased fascial restrictions, impaired perceived functional ability, postural dysfunction, and pain.   ACTIVITY LIMITATIONS: carrying, lifting, bending, sitting, standing, squatting, sleeping, stairs, transfers, locomotion level, and caring for others  PARTICIPATION LIMITATIONS: meal prep, cleaning, laundry, shopping, community activity, and yard work  PERSONAL FACTORS: 1 comorbidity: MS  are also affecting patient's functional outcome.   REHAB POTENTIAL: Good  CLINICAL DECISION MAKING: Stable/uncomplicated  EVALUATION COMPLEXITY: Low   GOALS: Goals reviewed with patient? No  SHORT TERM GOALS: Target date: 10/04/2022  patient will be independent with initial HEP   Baseline: Goal status: INITIAL  2.  Patient will self report 30% improvement to improve tolerance for functional activity  Baseline:  Goal status: INITIAL    LONG TERM GOALS: Target date: 10/18/2022  Patient will be independent in self management strategies to improve quality of life and functional outcomes.   Baseline:  Goal status: INITIAL  2.  Patient will self report 50% improvement to improve  tolerance for functional activity  Baseline:  Goal status: INITIAL  3.  Patient will improve FOTO score to predicted value    Baseline: 52 Goal status: INITIAL  4.  Patient will increase distance on to 275 ft  to demonstrate improved functional mobility walking household and community distances.   Baseline: 224 ft Goal status: INITIAL  5.  Patient will increase  leg MMTs to 4-5/5 without pain to promote return to ambulation community distances with minimal deviation.  Baseline: see above Goal status: INITIAL  PLAN:  PT FREQUENCY: 2x/week  PT DURATION: 4 weeks  PLANNED INTERVENTIONS: Therapeutic exercises, Therapeutic activity, Neuromuscular re-education, Balance training, Gait training, Patient/Family education, Joint manipulation, Joint mobilization, Stair training, Orthotic/Fit training, DME instructions, Aquatic Therapy, Dry Needling, Electrical stimulation, Spinal manipulation, Spinal mobilization, Cryotherapy, Moist heat, Compression bandaging, scar mobilization, Splintting, Taping, Traction, Ultrasound, Ionotophoresis 4mg /ml Dexamethasone, and Manual therapy .  PLAN FOR NEXT SESSION: Review  HEP and goals; progress lumbar extension, core and lower extremity strengthening; glutes and postural strengthening   3:06 PM, 09/20/22 Abena Erdman Small Kimmy Totten MPT [facility] [place] [phone] Ph:[phone]

## 2022-09-20 NOTE — Progress Notes (Addendum)
   Subjective:    Patient ID: Melinda Moore, female    DOB: 20-Sep-1970, 52 y.o.   MRN: [id number]  HPI Patient arrives today to fill out paper work.  Patient comes in today for follow-up on MS and chronic pain She has had MS for years She had her worst spell back in the early 2000's She was in the hospital during that time Because of fatigue tiredness muscle pains numbness burning and instability she was taken out of work in the early 2000's She was approved for Social Security disability back in the early 2000's Ever since then she has had ongoing pain and discomfort Has had to use opioid pains to get some relief Even with opioid pain medicine she has not gotten tremendous relief She finds herself having a lot of fatigue tiredness during the day muscles wear out quickly.  She is only able to walk for 5 to 8 minutes at a time without having to sit or at least hold onto something such as a walker or a buggy at the supermarket-she is not able to continuously walk much longer than that without having to sit down  Not able to do frequent lifting and unable to do twisting stooping squatting crawling Patient states she suffers with hand and arm fatigue when trying to do any repetitive motions with her hands such as keyboarding folding laundry doing dishes etc. She is only able to sit for 5 to 15 minutes without having increased pain discomfort numbness She is only able to stand for 5 to 10 minutes without having to sit and hold onto something She is not able to handle ladders or multiple stairs without difficulty She also has difficulty focusing cognitive processing and memory It is difficult for her to learn new processes    Review of Systems     Objective:   Physical Exam  Lungs clear heart regular edema noted in the lower legs Patient has a moderate gait abnormality when she walks her feet does not pick up well.  She takes short steps.  Relatively unsteady      Assessment & Plan:  I  do not feel it is of any value to send her for a functional capacity evaluation It is in my opinion that she continues to be disabled due to MS and chronic pain associated with MS,  and chronic low back pain She suffers with chronic fatigue related to MS Her condition will not improve over time It would not be wise for her to consider to go back to work Workplace could not make the accommodations necessary for this patient I do not expect this recommendation to change any in the future As per her MS she needs to follow-up with her MS doctor She will follow-up here for chronic pain management It should be noted that she states that if she did push herself to go to the her usual The following days she feels wiped out suspect close at the time sitting in a recliner or laying in her bed.  There is no workplace that can make the accommodations for this patient With the severity of her problem she is not able to work She would not be able to attend work on a regular basis and due to physical ailments would miss multiple days beyond what would be reasonable for any employee/employer.  In my opinion she is disabled As for morbid obesity portion control regular physical activity 25-30 minutes spent with patient

## 2022-09-25 ENCOUNTER — Other Ambulatory Visit: Payer: Self-pay | Admitting: Family Medicine

## 2022-09-25 ENCOUNTER — Other Ambulatory Visit: Payer: Self-pay | Admitting: Internal Medicine

## 2022-09-25 DIAGNOSIS — F419 Anxiety disorder, unspecified: Secondary | ICD-10-CM

## 2022-09-26 ENCOUNTER — Encounter (HOSPITAL_COMMUNITY): Payer: Self-pay

## 2022-09-26 ENCOUNTER — Ambulatory Visit (HOSPITAL_COMMUNITY): Payer: 59

## 2022-09-26 DIAGNOSIS — R262 Difficulty in walking, not elsewhere classified: Secondary | ICD-10-CM | POA: Diagnosis not present

## 2022-09-26 DIAGNOSIS — M545 Low back pain, unspecified: Secondary | ICD-10-CM

## 2022-09-26 DIAGNOSIS — M6281 Muscle weakness (generalized): Secondary | ICD-10-CM | POA: Diagnosis not present

## 2022-09-26 DIAGNOSIS — G35 Multiple sclerosis: Secondary | ICD-10-CM | POA: Diagnosis not present

## 2022-09-26 DIAGNOSIS — R2689 Other abnormalities of gait and mobility: Secondary | ICD-10-CM | POA: Diagnosis not present

## 2022-09-26 DIAGNOSIS — M549 Dorsalgia, unspecified: Secondary | ICD-10-CM | POA: Diagnosis not present

## 2022-09-26 NOTE — Therapy (Signed)
OUTPATIENT PHYSICAL THERAPY THORACOLUMBAR EVALUATION   Patient Name: Melinda Moore MRN: [id number] DOB:[birth year], 52 y.o., female Today's Date: 09/26/2022  END OF SESSION:  PT End of Session - 09/26/22 1435     Visit Number 2    Number of Visits 8    Date for PT Re-Evaluation 10/18/22    Authorization Type [employer]    Authorization - Number of Visits 60    PT Start Time 1302    PT Stop Time 1343    PT Time Calculation (min) 41 min    Activity Tolerance Patient tolerated treatment well    Behavior During Therapy WFL for tasks assessed/performed              Past Medical History:  Diagnosis Date   Anemia    Anxiety    Bell's palsy    Depression    DVT (deep venous thrombosis) 09/02/2012   [place] on 02/13/2012 showed acute DVT in left calf veins and posterior tibial veins   Fibromyalgia    GERD (gastroesophageal reflux disease)    History of blood transfusion 2000   History of trigeminal neuralgia    HTN (hypertension)    patient denies was taking a blood pressure medication in early 2000 but it was migraines   Leukopenia    MS (multiple sclerosis) 1997   Neuropathic pain    Peripheral edema    Port catheter in place 12/31/2012   Right bundle branch block    Past Surgical History:  Procedure Laterality Date   ABDOMINAL HYSTERECTOMY     CHOLECYSTECTOMY     COLONOSCOPY WITH PROPOFOL N/A 09/27/2021   Procedure: COLONOSCOPY WITH PROPOFOL;  Surgeon: Corbin Ade, MD;  Location: [facility];  Service: Endoscopy;  Laterality: N/A;  8:00am, asa 2   ESOPHAGOGASTRODUODENOSCOPY  2011   Dr. Jena Gauss. 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 Biopsy from the stomach revealed minimal chronic inactive inflammation but negative for H. pylori   FLEXIBLE SIGMOIDOSCOPY N/A 02/08/2022   Procedure: FLEXIBLE SIGMOIDOSCOPY;  Surgeon: Corbin Ade, MD;  Location: [facility];  Service: Endoscopy;   Laterality: N/A;   LAPAROSCOPIC GASTRIC SLEEVE RESECTION N/A 04/17/2016   Procedure: LAPAROSCOPIC GASTRIC SLEEVE RESECTION WITH UPPER ENDOSCOPY;  Surgeon: Glenna Fellows, MD;  Location: [facility];  Service: General;  Laterality: N/A;   PATELLA-FEMORAL ARTHROPLASTY Right 07/14/2019   Procedure: PATELLA-FEMORAL ARTHROPLASTY;  Surgeon: Teryl Lucy, MD;  Location: [facility];  Service: Orthopedics;  Laterality: Right;   PORTACATH PLACEMENT     TUBAL LIGATION  2001   Patient Active Problem List   Diagnosis Date Noted   Chronic low back pain (1ry area of Pain) (Bilateral) (R>L) w/o sciatica 07/18/2022   Lumbar facet arthropathy (Bilateral) 07/18/2022   Lumbar facet joint pain 07/18/2022   Chronic lower extremity pain (2ry area of Pain) (Right) 07/18/2022   DDD (degenerative disc disease), lumbosacral 07/18/2022   Abnormal MRI, lumbar spine (10/11/2017) 07/18/2022   Lumbosacral foraminal stenosis (Left: L5-S1) 07/18/2022   Obesity, Class II, BMI 35-39.9 07/18/2022   Osteoarthritis of facet joint of lumbar spine 07/18/2022   Primary osteoarthritis of lumbar spine 07/18/2022   History of total knee replacement (Right) 07/18/2022   Chronic knee pain s/p TKR (Right) 07/18/2022   Chronic medial knee pain (Right) 07/18/2022   Decreased range of motion of lumbar spine 07/18/2022   Pharmacologic therapy 07/15/2022   Disorder of skeletal system 07/15/2022   Problems influencing  health status 07/15/2022   Gastroesophageal reflux disease 09/07/2021   Colon cancer screening 09/07/2021   Hx of hysterectomy 06/14/2021   Elevated transaminase level 02/04/2020   Patellofemoral arthritis of knee (Right) 07/14/2019   History of DVT in adulthood 06/23/2019   Gait abnormality 03/12/2017   Radiculopathy due to lumbar intervertebral disc disorder 09/21/2016   Spondylosis without myelopathy or radiculopathy, lumbar region 09/21/2016   History of anxiety 09/08/2016   Primary osteoarthritis of knee (Right)  08/30/2016   Primary insomnia 08/30/2016   Myofascial pain 08/30/2016   Sciatica, right side 12/12/2015   Morbid obesity 08/12/2015   Chronic anxiety 07/08/2013   Swelling of breast 04/08/2013   Chronic pain syndrome 03/11/2013   Port catheter in place 12/31/2012   Constipation 10/01/2012   Anemia 10/01/2012   Rectal bleeding 10/01/2012   Elevated alkaline phosphatase level 10/01/2012   MS (multiple sclerosis) 09/02/2012   Anxiety 11/29/2011   Headache(784.0) 11/29/2011   ANEMIA 04/12/2010   Other dysphagia 04/12/2010    PCP: Lilyan Punt, MD  REFERRING PROVIDER: Babs Sciara, MD  REFERRING DIAG: M54.9 (ICD-10-CM) - Back pain, unspecified back location, unspecified back pain laterality, unspecified chronicity  Rationale for Evaluation and Treatment: Rehabilitation  THERAPY DIAG:  Low back pain, unspecified back pain laterality, unspecified chronicity, unspecified whether sciatica present  Muscle weakness (generalized)  Difficulty in walking, not elsewhere classified  Other abnormalities of gait and mobility  ONSET DATE: worse since 2017   SUBJECTIVE:                                                                                                                                                                                           SUBJECTIVE STATEMENT: 09/26/22:  Pt stated she has LBP and constant Rt LE pain scale 8/10.  Stated she has began the HEP without questions.  Pt stated heat does increase her fatigue, today is pretty good since it's cloudy and windy out- fatigue level at a 3/10.  Eval: Diagnosis with MS years ago; sees neurologist [facility] ; has generally done well but in 2017 started having more issues; after TKA and COVD " I just haven't bounced back"; reports stiffness, back pain and leg pain; MS has affected Right side more.  Pt accompanied by: self   PERTINENT HISTORY:   seeing pain management at [facility], Dr. Jola Schmidt for injections LBP;   TKA 2021 R ; then COVD shortly at the end of 2021  x 2 week in the hospital Also had some injections in the back per Dr. Murray Hodgkins that did help some   PAIN:  Are you having pain? Yes:  NPRS scale: 7/10 Pain location: Right leg Pain description: constant throbbing Aggravating factors: lots of activity Relieving factors: medication   PAIN:  Are you having pain? Yes: NPRS scale: 6/10 Pain location: low back and right leg Pain description: throbbing and nagging aching; constant Aggravating factors: activity Relieving factors: rest, injections  PRECAUTIONS: Fall  WEIGHT BEARING RESTRICTIONS: No  FALLS:  Has patient fallen in last 6 months? No  OCCUPATION: not working  PLOF: Independent with basic ADLs  PATIENT GOALS: get off some of these pain meds  NEXT MD VISIT: 10/04/22 Dr. Hetty Ely  OBJECTIVE:   DIAGNOSTIC FINDINGS:  CLINICAL DATA:  Low back pain. Chronic bilateral low back pain without sciatica. Lumbar facet arthropathy. Lumbar facet joint pain. Degenerative disc disease.   EXAM: LUMBAR SPINE - COMPLETE WITH BENDING VIEWS   COMPARISON:  Lumbar MRI 10/11/2017   FINDINGS: AP, bilateral oblique, lateral, lateral flexion, lateral extension and lateral L5-S1 spot views obtained. 5 non-rib-bearing lumbar vertebra with tiny ribs at L1 as before.   There is 4-5 mm of anterolisthesis of L4 on L5 present on flexion and extension, this is 0 mm on neutral imaging. Alignment is otherwise normal. Vertebral body heights are normal. There is mild L3-L4 and L4-L5 disc space narrowing and spurring. Mild L3-L4, moderate at L4-L5 and mild L5-S1 facet hypertrophy. No evidence of fracture focal bone abnormalities. The sacroiliac joints are congruent.   IMPRESSION: 1. Degenerative disc disease and facet arthropathy at L3-L4 and L4-L5. 2. Grade 1 anterolisthesis of L4 on L5 measuring 4-5 mm on flexion and extension, 0 mm on neutral imaging.     Electronically Signed   By:  Narda Rutherford M.D.   On: 07/20/2022 15:18    PATIENT SURVEYS:  FOTO 52  COGNITION: Overall cognitive status: Within functional limits for tasks assessed     SENSATION: Neuropathy both fee  POSTURE: rounded shoulders, forward head, and flexed trunk   PALPATION:   LUMBAR ROM:   AROM eval  Flexion 60% available  Extension Unable to get to neutral  Right lateral flexion   Left lateral flexion   Right rotation   Left rotation    (Blank rows = not tested)   LOWER EXTREMITY MMT:    MMT Right eval Left eval  Hip flexion 4 4  Hip extension 2 2  Hip abduction    Hip adduction    Hip internal rotation    Hip external rotation    Knee flexion 3- 3-  Knee extension 4 4  Ankle dorsiflexion 4+ 4+  Ankle plantarflexion    Ankle inversion    Ankle eversion     (Blank rows = not tested)   FUNCTIONAL TESTS:  5 times sit to stand: 18.18 sec using arm to push up on thighs.  2 minute walk test: 224 ft (had to stop before time was up; about 36 sec less)  GAIT: Distance walked: 224 ft Assistive device utilized: None Level of assistance: Modified independence Comments: forward flexed trunk; increased back pain  TODAY'S TREATMENT:  DATE: 09/26/22: Reviewed goals, discussed benefits with HEP compliance for maximal benefits. 2 sets x 5 reps STS with eccentric control Standing lumbar extension Prone press up 10x 30" holds Glut sets 10x 5" Attempted knee bent pushing foot towards ceiling- too difficult Supine:  Bridge 10x 5" eccentric lowering Marching 10x5 " with ab set Sidelying: clam    09/20/22 physical therapy evaluation and HEP instruction    PATIENT EDUCATION:  Education details: Patient educated on exam findings, POC, scope of PT, HEP, and what to expect next visit. Person educated: Patient Education method: Explanation, Demonstration,  and Handouts Education comprehension: verbalized understanding, returned demonstration, verbal cues required, and tactile cues required  HOME EXERCISE PROGRAM: Access Code: [email or web address] URL: [email or web address] Date: 09/20/2022 Prepared by: [facility]  Exercises - Static Prone on Elbows  - 2 x daily - 7 x weekly - 1 sets - 1 reps - 3 min hold - Prone Press Up  - 2 x daily - 7 x weekly - 1 sets - 5 reps - Prone Gluteal Sets  - 2 x daily - 7 x weekly - 1 sets - 10 reps - 5" hold - Supine Bridge  - 2 x daily - 7 x weekly - 3 sets - 10 reps -ASSESSMENT:  CLINICAL IMPRESSION: 09/26/22:  Reviewed goals and discussed benefits with HEP compliance.  Pt able to recall and demonstrate appropriate form and mechanics with current exercise program.  Session focus with core and proximal strengthening, cueing for form to increase eccentric control which was difficult due to weakness.  Monitored fatigue level with range from 3-4/10 through session.    Eval: Patient is a 52 y.o. female who was seen today for physical therapy evaluation and treatment for low back pain that radiates down right leg.  Patient  presents to physical therapy with complaint of back pain and leg and back weakness. Patient demonstrates muscle weakness, reduced ROM, and fascial restrictions which are likely contributing to symptoms of pain and are negatively impacting patient ability to perform ADLs and functional mobility tasks. Patient will benefit from skilled physical therapy services to address these deficits to reduce pain and improve level of function with ADLs and functional mobility tasks.    OBJECTIVE IMPAIRMENTS: Abnormal gait, decreased activity tolerance, decreased mobility, difficulty walking, decreased ROM, decreased strength, hypomobility, increased fascial restrictions, impaired perceived functional ability, postural dysfunction, and pain.   ACTIVITY LIMITATIONS: carrying, lifting, bending, sitting, standing,  squatting, sleeping, stairs, transfers, locomotion level, and caring for others  PARTICIPATION LIMITATIONS: meal prep, cleaning, laundry, shopping, community activity, and yard work  PERSONAL FACTORS: 1 comorbidity: MS  are also affecting patient's functional outcome.   REHAB POTENTIAL: Good  CLINICAL DECISION MAKING: Stable/uncomplicated  EVALUATION COMPLEXITY: Low   GOALS: Goals reviewed with patient? No  SHORT TERM GOALS: Target date: 10/04/2022  patient will be independent with initial HEP   Baseline: Goal status: IN PROGRESS  2.  Patient will self report 30% improvement to improve tolerance for functional activity  Baseline:  Goal status: IN PROGRESS    LONG TERM GOALS: Target date: 10/18/2022  Patient will be independent in self management strategies to improve quality of life and functional outcomes.   Baseline:  Goal status: IN PROGRESS  2.  Patient will self report 50% improvement to improve tolerance for functional activity  Baseline:  Goal status: IN PROGRESS  3.  Patient will improve FOTO score to predicted value    Baseline: 52 Goal status: IN PROGRESS  4.  Patient  will increase distance on to 275 ft  to demonstrate improved functional mobility walking household and community distances.   Baseline: 224 ft Goal status: IN PROGRESS  5.  Patient will increase  leg MMTs to 4-5/5 without pain to promote return to ambulation community distances with minimal deviation.  Baseline: see above Goal status: IN PROGRESS  PLAN:  PT FREQUENCY: 2x/week  PT DURATION: 4 weeks  PLANNED INTERVENTIONS: Therapeutic exercises, Therapeutic activity, Neuromuscular re-education, Balance training, Gait training, Patient/Family education, Joint manipulation, Joint mobilization, Stair training, Orthotic/Fit training, DME instructions, Aquatic Therapy, Dry Needling, Electrical stimulation, Spinal manipulation, Spinal mobilization, Cryotherapy, Moist heat,  Compression bandaging, scar mobilization, Splintting, Taping, Traction, Ultrasound, Ionotophoresis 4mg /ml Dexamethasone, and Manual therapy .  PLAN FOR NEXT SESSION:  Progress lumbar extension, core and lower extremity strengthening; glutes and postural strengthening.  Add sidelying abd next session.  Becky Sax, LPTA/CLT; [facility] [phone]  2:37 PM, 09/26/22

## 2022-09-27 ENCOUNTER — Other Ambulatory Visit: Payer: Self-pay | Admitting: Family Medicine

## 2022-09-28 ENCOUNTER — Ambulatory Visit (HOSPITAL_COMMUNITY): Payer: 59

## 2022-09-28 ENCOUNTER — Encounter (HOSPITAL_COMMUNITY): Payer: Self-pay

## 2022-09-28 DIAGNOSIS — M6281 Muscle weakness (generalized): Secondary | ICD-10-CM

## 2022-09-28 DIAGNOSIS — M545 Low back pain, unspecified: Secondary | ICD-10-CM

## 2022-09-28 DIAGNOSIS — R2689 Other abnormalities of gait and mobility: Secondary | ICD-10-CM

## 2022-09-28 DIAGNOSIS — M549 Dorsalgia, unspecified: Secondary | ICD-10-CM | POA: Diagnosis not present

## 2022-09-28 DIAGNOSIS — R262 Difficulty in walking, not elsewhere classified: Secondary | ICD-10-CM

## 2022-09-28 DIAGNOSIS — G35 Multiple sclerosis: Secondary | ICD-10-CM | POA: Diagnosis not present

## 2022-09-28 NOTE — Therapy (Signed)
OUTPATIENT PHYSICAL THERAPY THORACOLUMBAR EVALUATION   Patient Name: Melinda Moore MRN: [id number] DOB:1970-12-18, 52 y.o., female Today's Date: 09/28/2022  END OF SESSION:  PT End of Session - 09/28/22 1400     Visit Number 3    Number of Visits 8    Date for PT Re-Evaluation 10/18/22    Authorization Type [employer]    Authorization - Number of Visits 60    PT Start Time 1301    PT Stop Time 1343    PT Time Calculation (min) 42 min    Activity Tolerance Patient limited by fatigue;Patient tolerated treatment well    Behavior During Therapy [facility] for tasks assessed/performed               Past Medical History:  Diagnosis Date   Anemia    Anxiety    Bell's palsy    Depression    DVT (deep venous thrombosis) 09/02/2012   [place] on 02/13/2012 showed acute DVT in left calf veins and posterior tibial veins   Fibromyalgia    GERD (gastroesophageal reflux disease)    History of blood transfusion 2000   History of trigeminal neuralgia    HTN (hypertension)    patient denies was taking a blood pressure medication in early 2000 but it was migraines   Leukopenia    MS (multiple sclerosis) 1997   Neuropathic pain    Peripheral edema    Port catheter in place 12/31/2012   Right bundle branch block    Past Surgical History:  Procedure Laterality Date   ABDOMINAL HYSTERECTOMY     CHOLECYSTECTOMY     COLONOSCOPY WITH PROPOFOL N/A 09/27/2021   Procedure: COLONOSCOPY WITH PROPOFOL;  Surgeon: Corbin Ade, MD;  Location: [facility];  Service: Endoscopy;  Laterality: N/A;  8:00am, asa 2   ESOPHAGOGASTRODUODENOSCOPY  2011   Dr. Jena Gauss. 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 Biopsy from the stomach revealed minimal chronic inactive inflammation but negative for H. pylori   FLEXIBLE SIGMOIDOSCOPY N/A 02/08/2022   Procedure: FLEXIBLE SIGMOIDOSCOPY;  Surgeon: Corbin Ade, MD;  Location: [facility];  Service: Endoscopy;  Laterality: N/A;   LAPAROSCOPIC GASTRIC SLEEVE RESECTION N/A 04/17/2016   Procedure: LAPAROSCOPIC GASTRIC SLEEVE RESECTION WITH UPPER ENDOSCOPY;  Surgeon: Glenna Fellows, MD;  Location: [facility];  Service: General;  Laterality: N/A;   PATELLA-FEMORAL ARTHROPLASTY Right 07/14/2019   Procedure: PATELLA-FEMORAL ARTHROPLASTY;  Surgeon: Teryl Lucy, MD;  Location: [facility];  Service: Orthopedics;  Laterality: Right;   PORTACATH PLACEMENT     TUBAL LIGATION  2001   Patient Active Problem List   Diagnosis Date Noted   Chronic low back pain (1ry area of Pain) (Bilateral) (R>L) w/o sciatica 07/18/2022   Lumbar facet arthropathy (Bilateral) 07/18/2022   Lumbar facet joint pain 07/18/2022   Chronic lower extremity pain (2ry area of Pain) (Right) 07/18/2022   DDD (degenerative disc disease), lumbosacral 07/18/2022   Abnormal MRI, lumbar spine (10/11/2017) 07/18/2022   Lumbosacral foraminal stenosis (Left: L5-S1) 07/18/2022   Obesity, Class II, BMI 35-39.9 07/18/2022   Osteoarthritis of facet joint of lumbar spine 07/18/2022   Primary osteoarthritis of lumbar spine 07/18/2022   History of total knee replacement (Right) 07/18/2022   Chronic knee pain s/p TKR (Right) 07/18/2022   Chronic medial knee pain (Right) 07/18/2022   Decreased range of motion of lumbar spine 07/18/2022   Pharmacologic therapy 07/15/2022   Disorder of skeletal system 07/15/2022  Problems influencing health status 07/15/2022   Gastroesophageal reflux disease 09/07/2021   Colon cancer screening 09/07/2021   Hx of hysterectomy 06/14/2021   Elevated transaminase level 02/04/2020   Patellofemoral arthritis of knee (Right) 07/14/2019   History of DVT in adulthood 06/23/2019   Gait abnormality 03/12/2017   Radiculopathy due to lumbar intervertebral disc disorder 09/21/2016   Spondylosis without myelopathy or radiculopathy, lumbar region 09/21/2016   History of anxiety 09/08/2016   Primary  osteoarthritis of knee (Right) 08/30/2016   Primary insomnia 08/30/2016   Myofascial pain 08/30/2016   Sciatica, right side 12/12/2015   Morbid obesity 08/12/2015   Chronic anxiety 07/08/2013   Swelling of breast 04/08/2013   Chronic pain syndrome 03/11/2013   Port catheter in place 12/31/2012   Constipation 10/01/2012   Anemia 10/01/2012   Rectal bleeding 10/01/2012   Elevated alkaline phosphatase level 10/01/2012   MS (multiple sclerosis) 09/02/2012   Anxiety 11/29/2011   Headache(784.0) 11/29/2011   ANEMIA 04/12/2010   Other dysphagia 04/12/2010    PCP: Lilyan Punt, MD  REFERRING PROVIDER: Babs Sciara, MD  REFERRING DIAG: M54.9 (ICD-10-CM) - Back pain, unspecified back location, unspecified back pain laterality, unspecified chronicity  Rationale for Evaluation and Treatment: Rehabilitation  THERAPY DIAG:  Low back pain, unspecified back pain laterality, unspecified chronicity, unspecified whether sciatica present  Muscle weakness (generalized)  Difficulty in walking, not elsewhere classified  Other abnormalities of gait and mobility  ONSET DATE: worse since 2017   SUBJECTIVE:                                                                                                                                                                                           SUBJECTIVE STATEMENT: 09/28/22:  Pt stated she is feeling good today, pain scale 4-5/10 LBP and Rt LE.  Has added the additional exercises to HEP without questions.  Minimal fatigue today.  Eval: Diagnosis with MS years ago; sees neurologist in [facility] ; has generally done well but in 2017 started having more issues; after TKA and COVD " I just haven't bounced back"; reports stiffness, back pain and leg pain; MS has affected Right side more.  Pt accompanied by: self   PERTINENT HISTORY:   seeing pain management at [facility], Dr. Jola Schmidt for injections LBP;  TKA 2021 R ; then COVD shortly at the  end of 2021  x 2 week in the hospital Also had some injections in the back per Dr. Murray Hodgkins that did help some   PAIN:  Are you having pain? Yes: NPRS scale: 7/10 Pain location: Right leg Pain description: constant throbbing Aggravating factors: lots  of activity Relieving factors: medication   PAIN:  Are you having pain? Yes: NPRS scale: 6/10 Pain location: low back and right leg Pain description: throbbing and nagging aching; constant Aggravating factors: activity Relieving factors: rest, injections  PRECAUTIONS: Fall  WEIGHT BEARING RESTRICTIONS: No  FALLS:  Has patient fallen in last 6 months? No  OCCUPATION: not working  PLOF: Independent with basic ADLs  PATIENT GOALS: get off some of these pain meds  NEXT MD VISIT: 10/04/22 Dr. Hetty Ely  OBJECTIVE:   DIAGNOSTIC FINDINGS:  CLINICAL DATA:  Low back pain. Chronic bilateral low back pain without sciatica. Lumbar facet arthropathy. Lumbar facet joint pain. Degenerative disc disease.   EXAM: LUMBAR SPINE - COMPLETE WITH BENDING VIEWS   COMPARISON:  Lumbar MRI 10/11/2017   FINDINGS: AP, bilateral oblique, lateral, lateral flexion, lateral extension and lateral L5-S1 spot views obtained. 5 non-rib-bearing lumbar vertebra with tiny ribs at L1 as before.   There is 4-5 mm of anterolisthesis of L4 on L5 present on flexion and extension, this is 0 mm on neutral imaging. Alignment is otherwise normal. Vertebral body heights are normal. There is mild L3-L4 and L4-L5 disc space narrowing and spurring. Mild L3-L4, moderate at L4-L5 and mild L5-S1 facet hypertrophy. No evidence of fracture focal bone abnormalities. The sacroiliac joints are congruent.   IMPRESSION: 1. Degenerative disc disease and facet arthropathy at L3-L4 and L4-L5. 2. Grade 1 anterolisthesis of L4 on L5 measuring 4-5 mm on flexion and extension, 0 mm on neutral imaging.     Electronically Signed   By: Narda Rutherford M.D.   On: 07/20/2022  15:18    PATIENT SURVEYS:  FOTO 52  COGNITION: Overall cognitive status: Within functional limits for tasks assessed     SENSATION: Neuropathy both fee  POSTURE: rounded shoulders, forward head, and flexed trunk   PALPATION:   LUMBAR ROM:   AROM eval  Flexion 60% available  Extension Unable to get to neutral  Right lateral flexion   Left lateral flexion   Right rotation   Left rotation    (Blank rows = not tested)   LOWER EXTREMITY MMT:    MMT Right eval Left eval  Hip flexion 4 4  Hip extension 2 2  Hip abduction    Hip adduction    Hip internal rotation    Hip external rotation    Knee flexion 3- 3-  Knee extension 4 4  Ankle dorsiflexion 4+ 4+  Ankle plantarflexion    Ankle inversion    Ankle eversion     (Blank rows = not tested)   FUNCTIONAL TESTS:  5 times sit to stand: 18.18 sec using arm to push up on thighs.  2 minute walk test: 224 ft (had to stop before time was up; about 36 sec less)  GAIT: Distance walked: 224 ft Assistive device utilized: None Level of assistance: Modified independence Comments: forward flexed trunk; increased back pain  TODAY'S TREATMENT:  DATE: 09/28/22:  Sidelying abduction 10x Supine: Bridge 2sets 10x Thomas stretch on edge of mat 2 x 30" towel assistance Prone press up 10x 10" Rest break infront of fan to address fatigue STS 5x Standing lumbar extension  09/26/22: Reviewed goals, discussed benefits with HEP compliance for maximal benefits. 2 sets x 5 reps STS with eccentric control Standing lumbar extension Prone press up 10x 30" holds Glut sets 10x 5" Attempted knee bent pushing foot towards ceiling- too difficult Supine:  Bridge 10x 5" eccentric lowering Marching 10x5 " with ab set Sidelying: clam    09/20/22 physical therapy evaluation and HEP instruction    PATIENT  EDUCATION:  Education details: Patient educated on exam findings, POC, scope of PT, HEP, and what to expect next visit. Person educated: Patient Education method: Explanation, Demonstration, and Handouts Education comprehension: verbalized understanding, returned demonstration, verbal cues required, and tactile cues required  HOME EXERCISE PROGRAM: Access Code: [email or web address] URL: [email or web address] Date: 09/20/2022 Prepared by: [facility]  Exercises - Static Prone on Elbows  - 2 x daily - 7 x weekly - 1 sets - 1 reps - 3 min hold - Prone Press Up  - 2 x daily - 7 x weekly - 1 sets - 5 reps - Prone Gluteal Sets  - 2 x daily - 7 x weekly - 1 sets - 10 reps - 5" hold - Supine Bridge  - 2 x daily - 7 x weekly - 3 sets - 10 reps  Access Code: [email or web address] URL: [email or web address] Date: 09/28/2022 Prepared by: Becky Sax  Exercises - Static Prone on Elbows  - 2 x daily - 7 x weekly - 1 sets - 1 reps - 3 min hold - Prone Press Up  - 2 x daily - 7 x weekly - 1 sets - 5 reps - Prone Gluteal Sets  - 2 x daily - 7 x weekly - 1 sets - 10 reps - 5" hold - Supine Bridge  - 2 x daily - 7 x weekly - 3 sets - 10 reps - Supine Lower Trunk Rotation  - 2 x daily - 7 x weekly - 1 sets - 10 reps - Supine Transversus Abdominis Bracing - Hands on Stomach  - 2 x daily - 7 x weekly - 1 sets - 10 reps - Prone Hip Extension  - 1 x daily - 7 x weekly - 1-2 sets - 10 reps- when ready - Clam  - 1 x daily - 7 x weekly - 3 sets - 10 reps - 5" hold -Supine Thomas stretch 3x 30" with towel assistance -ASSESSMENT:  CLINICAL IMPRESSION: 09/28/22:  Continued with extension based exercises for core and proximal strengthening.  Added Thomas stretch to address the hip flexor tightness to assist with erect posture.  Pt limited by increased fatigue following bridge exercise, rest break in front of fan to address overheating.  Encouraged to monitor her fatigue level and not let it go above  5/10.  Eval: Patient is a 52 y.o. female who was seen today for physical therapy evaluation and treatment for low back pain that radiates down right leg.  Patient  presents to physical therapy with complaint of back pain and leg and back weakness. Patient demonstrates muscle weakness, reduced ROM, and fascial restrictions which are likely contributing to symptoms of pain and are negatively impacting patient ability to perform ADLs and functional mobility tasks. Patient will benefit from skilled physical therapy services to address these deficits to reduce pain and improve  level of function with ADLs and functional mobility tasks.    OBJECTIVE IMPAIRMENTS: Abnormal gait, decreased activity tolerance, decreased mobility, difficulty walking, decreased ROM, decreased strength, hypomobility, increased fascial restrictions, impaired perceived functional ability, postural dysfunction, and pain.   ACTIVITY LIMITATIONS: carrying, lifting, bending, sitting, standing, squatting, sleeping, stairs, transfers, locomotion level, and caring for others  PARTICIPATION LIMITATIONS: meal prep, cleaning, laundry, shopping, community activity, and yard work  PERSONAL FACTORS: 1 comorbidity: MS  are also affecting patient's functional outcome.   REHAB POTENTIAL: Good  CLINICAL DECISION MAKING: Stable/uncomplicated  EVALUATION COMPLEXITY: Low   GOALS: Goals reviewed with patient? No  SHORT TERM GOALS: Target date: 10/04/2022  patient will be independent with initial HEP   Baseline: Goal status: IN PROGRESS  2.  Patient will self report 30% improvement to improve tolerance for functional activity  Baseline:  Goal status: IN PROGRESS    LONG TERM GOALS: Target date: 10/18/2022  Patient will be independent in self management strategies to improve quality of life and functional outcomes.   Baseline:  Goal status: IN PROGRESS  2.  Patient will self report 50% improvement to improve tolerance for  functional activity  Baseline:  Goal status: IN PROGRESS  3.  Patient will improve FOTO score to predicted value    Baseline: 52 Goal status: IN PROGRESS  4.  Patient will increase distance on to 275 ft  to demonstrate improved functional mobility walking household and community distances.   Baseline: 224 ft Goal status: IN PROGRESS  5.  Patient will increase  leg MMTs to 4-5/5 without pain to promote return to ambulation community distances with minimal deviation.  Baseline: see above Goal status: IN PROGRESS  PLAN:  PT FREQUENCY: 2x/week  PT DURATION: 4 weeks  PLANNED INTERVENTIONS: Therapeutic exercises, Therapeutic activity, Neuromuscular re-education, Balance training, Gait training, Patient/Family education, Joint manipulation, Joint mobilization, Stair training, Orthotic/Fit training, DME instructions, Aquatic Therapy, Dry Needling, Electrical stimulation, Spinal manipulation, Spinal mobilization, Cryotherapy, Moist heat, Compression bandaging, scar mobilization, Splintting, Taping, Traction, Ultrasound, Ionotophoresis 4mg /ml Dexamethasone, and Manual therapy .  PLAN FOR NEXT SESSION:  Progress lumbar extension, core and lower extremity strengthening; glutes and postural strengthening.  Add standing rows with theraband, continue gluteal strengthening.  Becky Sax, LPTA/CLT; CBIS [phone]  2:01 PM, 09/28/22

## 2022-10-02 ENCOUNTER — Encounter (HOSPITAL_COMMUNITY): Payer: Self-pay

## 2022-10-02 ENCOUNTER — Ambulatory Visit (HOSPITAL_COMMUNITY): Payer: 59

## 2022-10-02 DIAGNOSIS — R262 Difficulty in walking, not elsewhere classified: Secondary | ICD-10-CM | POA: Diagnosis not present

## 2022-10-02 DIAGNOSIS — G35 Multiple sclerosis: Secondary | ICD-10-CM | POA: Diagnosis not present

## 2022-10-02 DIAGNOSIS — R2689 Other abnormalities of gait and mobility: Secondary | ICD-10-CM

## 2022-10-02 DIAGNOSIS — M545 Low back pain, unspecified: Secondary | ICD-10-CM

## 2022-10-02 DIAGNOSIS — M549 Dorsalgia, unspecified: Secondary | ICD-10-CM | POA: Diagnosis not present

## 2022-10-02 DIAGNOSIS — M6281 Muscle weakness (generalized): Secondary | ICD-10-CM | POA: Diagnosis not present

## 2022-10-02 NOTE — Therapy (Signed)
OUTPATIENT PHYSICAL THERAPY THORACOLUMBAR TREATMENT   Patient Name: Melinda Moore MRN: [id number] DOB:Feb 02, 1971, 52 y.o., female Today's Date: 10/02/2022  END OF SESSION:      Past Medical History:  Diagnosis Date   Anemia    Anxiety    Bell's palsy    Depression    DVT (deep venous thrombosis) 09/02/2012   [place] on 02/13/2012 showed acute DVT in left calf veins and posterior tibial veins   Fibromyalgia    GERD (gastroesophageal reflux disease)    History of blood transfusion 2000   History of trigeminal neuralgia    HTN (hypertension)    patient denies was taking a blood pressure medication in early 2000 but it was migraines   Leukopenia    MS (multiple sclerosis) 1997   Neuropathic pain    Peripheral edema    Port catheter in place 12/31/2012   Right bundle branch block    Past Surgical History:  Procedure Laterality Date   ABDOMINAL HYSTERECTOMY     CHOLECYSTECTOMY     COLONOSCOPY WITH PROPOFOL N/A 09/27/2021   Procedure: COLONOSCOPY WITH PROPOFOL;  Surgeon: Corbin Ade, MD;  Location: [facility];  Service: Endoscopy;  Laterality: N/A;  8:00am, asa 2   ESOPHAGOGASTRODUODENOSCOPY  2011   Dr. Jena Gauss. 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 Biopsy from the stomach revealed minimal chronic inactive inflammation but negative for H. pylori   FLEXIBLE SIGMOIDOSCOPY N/A 02/08/2022   Procedure: FLEXIBLE SIGMOIDOSCOPY;  Surgeon: Corbin Ade, MD;  Location: [facility];  Service: Endoscopy;  Laterality: N/A;   LAPAROSCOPIC GASTRIC SLEEVE RESECTION N/A 04/17/2016   Procedure: LAPAROSCOPIC GASTRIC SLEEVE RESECTION WITH UPPER ENDOSCOPY;  Surgeon: Glenna Fellows, MD;  Location: [facility];  Service: General;  Laterality: N/A;   PATELLA-FEMORAL ARTHROPLASTY Right 07/14/2019   Procedure: PATELLA-FEMORAL ARTHROPLASTY;  Surgeon: Teryl Lucy, MD;  Location: [facility];  Service: Orthopedics;   Laterality: Right;   PORTACATH PLACEMENT     TUBAL LIGATION  2001   Patient Active Problem List   Diagnosis Date Noted   Chronic low back pain (1ry area of Pain) (Bilateral) (R>L) w/o sciatica 07/18/2022   Lumbar facet arthropathy (Bilateral) 07/18/2022   Lumbar facet joint pain 07/18/2022   Chronic lower extremity pain (2ry area of Pain) (Right) 07/18/2022   DDD (degenerative disc disease), lumbosacral 07/18/2022   Abnormal MRI, lumbar spine (10/11/2017) 07/18/2022   Lumbosacral foraminal stenosis (Left: L5-S1) 07/18/2022   Obesity, Class II, BMI 35-39.9 07/18/2022   Osteoarthritis of facet joint of lumbar spine 07/18/2022   Primary osteoarthritis of lumbar spine 07/18/2022   History of total knee replacement (Right) 07/18/2022   Chronic knee pain s/p TKR (Right) 07/18/2022   Chronic medial knee pain (Right) 07/18/2022   Decreased range of motion of lumbar spine 07/18/2022   Pharmacologic therapy 07/15/2022   Disorder of skeletal system 07/15/2022   Problems influencing health status 07/15/2022   Gastroesophageal reflux disease 09/07/2021   Colon cancer screening 09/07/2021   Hx of hysterectomy 06/14/2021   Elevated transaminase level 02/04/2020   Patellofemoral arthritis of knee (Right) 07/14/2019   History of DVT in adulthood 06/23/2019   Gait abnormality 03/12/2017   Radiculopathy due to lumbar intervertebral disc disorder 09/21/2016   Spondylosis without myelopathy or radiculopathy, lumbar region 09/21/2016   History of anxiety 09/08/2016   Primary osteoarthritis of knee (Right) 08/30/2016   Primary insomnia 08/30/2016   Myofascial pain 08/30/2016   Sciatica,  right side 12/12/2015   Morbid obesity 08/12/2015   Chronic anxiety 07/08/2013   Swelling of breast 04/08/2013   Chronic pain syndrome 03/11/2013   Port catheter in place 12/31/2012   Constipation 10/01/2012   Anemia 10/01/2012   Rectal bleeding 10/01/2012   Elevated alkaline phosphatase level 10/01/2012   MS  (multiple sclerosis) 09/02/2012   Anxiety 11/29/2011   Headache(784.0) 11/29/2011   ANEMIA 04/12/2010   Other dysphagia 04/12/2010    PCP: Lilyan Punt, MD  REFERRING PROVIDER: Babs Sciara, MD  REFERRING DIAG: M54.9 (ICD-10-CM) - Back pain, unspecified back location, unspecified back pain laterality, unspecified chronicity  Rationale for Evaluation and Treatment: Rehabilitation  THERAPY DIAG:  No diagnosis found.  ONSET DATE: worse since 2017   SUBJECTIVE:                                                                                                                                                                                           SUBJECTIVE STATEMENT: 10/02/22:  Pt stated she had the "feel good soreness" following last session just the rest of the day.  Current pain scale 4/10 LBP dull achey pain and constant burning down Rt LE.  No fatigue at entrance  Eval: Diagnosis with MS years ago; sees neurologist in [facility] ; has generally done well but in 2017 started having more issues; after TKA and COVD " I just haven't bounced back"; reports stiffness, back pain and leg pain; MS has affected Right side more.  Pt accompanied by: self   PERTINENT HISTORY:   seeing pain management at [facility], Dr. Jola Schmidt for injections LBP;  TKA 2021 R ; then COVD shortly at the end of 2021  x 2 week in the hospital Also had some injections in the back per Dr. Murray Hodgkins that did help some   PAIN:  Are you having pain? Yes: NPRS scale: 7/10 Pain location: Right leg Pain description: constant throbbing Aggravating factors: lots of activity Relieving factors: medication   PAIN:  Are you having pain? Yes: NPRS scale: 6/10 Pain location: low back and right leg Pain description: throbbing and nagging aching; constant Aggravating factors: activity Relieving factors: rest, injections  PRECAUTIONS: Fall  WEIGHT BEARING RESTRICTIONS: No  FALLS:  Has patient fallen in last 6  months? No  OCCUPATION: not working  PLOF: Independent with basic ADLs  PATIENT GOALS: get off some of these pain meds  NEXT MD VISIT: 10/04/22 Dr. Hetty Ely  OBJECTIVE:   DIAGNOSTIC FINDINGS:  CLINICAL DATA:  Low back pain. Chronic bilateral low back pain without sciatica. Lumbar facet arthropathy. Lumbar facet joint pain. Degenerative disc disease.  EXAM: LUMBAR SPINE - COMPLETE WITH BENDING VIEWS   COMPARISON:  Lumbar MRI 10/11/2017   FINDINGS: AP, bilateral oblique, lateral, lateral flexion, lateral extension and lateral L5-S1 spot views obtained. 5 non-rib-bearing lumbar vertebra with tiny ribs at L1 as before.   There is 4-5 mm of anterolisthesis of L4 on L5 present on flexion and extension, this is 0 mm on neutral imaging. Alignment is otherwise normal. Vertebral body heights are normal. There is mild L3-L4 and L4-L5 disc space narrowing and spurring. Mild L3-L4, moderate at L4-L5 and mild L5-S1 facet hypertrophy. No evidence of fracture focal bone abnormalities. The sacroiliac joints are congruent.   IMPRESSION: 1. Degenerative disc disease and facet arthropathy at L3-L4 and L4-L5. 2. Grade 1 anterolisthesis of L4 on L5 measuring 4-5 mm on flexion and extension, 0 mm on neutral imaging.     Electronically Signed   By: Narda Rutherford M.D.   On: 07/20/2022 15:18    PATIENT SURVEYS:  FOTO 52  COGNITION: Overall cognitive status: Within functional limits for tasks assessed     SENSATION: Neuropathy both fee  POSTURE: rounded shoulders, forward head, and flexed trunk   PALPATION:   LUMBAR ROM:   AROM eval  Flexion 60% available  Extension Unable to get to neutral  Right lateral flexion   Left lateral flexion   Right rotation   Left rotation    (Blank rows = not tested)   LOWER EXTREMITY MMT:    MMT Right eval Left eval  Hip flexion 4 4  Hip extension 2 2  Hip abduction    Hip adduction    Hip internal rotation    Hip external  rotation    Knee flexion 3- 3-  Knee extension 4 4  Ankle dorsiflexion 4+ 4+  Ankle plantarflexion    Ankle inversion    Ankle eversion     (Blank rows = not tested)   FUNCTIONAL TESTS:  5 times sit to stand: 18.18 sec using arm to push up on thighs.  2 minute walk test: 224 ft (had to stop before time was up; about 36 sec less)  GAIT: Distance walked: 224 ft Assistive device utilized: None Level of assistance: Modified independence Comments: forward flexed trunk; increased back pain  TODAY'S TREATMENT:                                                                                                                              DATE: 10/02/22: Prone press up 10x 10"  Standing: Squat then extension 10x front of chair for mechanics RTB shoulder extension 10x  Rows 10x   Hip flexor stance on 12in step 3x20"  STS 10x eccentric control  09/28/22:  Sidelying abduction 10x Supine: Bridge 2sets 10x Thomas stretch on edge of mat 2 x 30" towel assistance Prone press up 10x 10" Rest break infront of fan to address fatigue STS 5x Standing lumbar extension  09/26/22: Reviewed goals, discussed benefits with HEP  compliance for maximal benefits. 2 sets x 5 reps STS with eccentric control Standing lumbar extension Prone press up 10x 30" holds Glut sets 10x 5" Attempted knee bent pushing foot towards ceiling- too difficult Supine:  Bridge 10x 5" eccentric lowering Marching 10x5 " with ab set Sidelying: clam    09/20/22 physical therapy evaluation and HEP instruction    PATIENT EDUCATION:  Education details: Patient educated on exam findings, POC, scope of PT, HEP, and what to expect next visit. Person educated: Patient Education method: Explanation, Demonstration, and Handouts Education comprehension: verbalized understanding, returned demonstration, verbal cues required, and tactile cues required  HOME EXERCISE PROGRAM: Access Code: [email or web address] URL:  [email or web address] Date: 09/20/2022 Prepared by: [facility]  Exercises - Static Prone on Elbows  - 2 x daily - 7 x weekly - 1 sets - 1 reps - 3 min hold - Prone Press Up  - 2 x daily - 7 x weekly - 1 sets - 5 reps - Prone Gluteal Sets  - 2 x daily - 7 x weekly - 1 sets - 10 reps - 5" hold - Supine Bridge  - 2 x daily - 7 x weekly - 3 sets - 10 reps  Access Code: [email or web address] URL: [email or web address] Date: 09/28/2022 Prepared by: Becky Sax  Exercises - Static Prone on Elbows  - 2 x daily - 7 x weekly - 1 sets - 1 reps - 3 min hold - Prone Press Up  - 2 x daily - 7 x weekly - 1 sets - 5 reps - Prone Gluteal Sets  - 2 x daily - 7 x weekly - 1 sets - 10 reps - 5" hold - Supine Bridge  - 2 x daily - 7 x weekly - 3 sets - 10 reps - Supine Lower Trunk Rotation  - 2 x daily - 7 x weekly - 1 sets - 10 reps - Supine Transversus Abdominis Bracing - Hands on Stomach  - 2 x daily - 7 x weekly - 1 sets - 10 reps - Prone Hip Extension  - 1 x daily - 7 x weekly - 1-2 sets - 10 reps- when ready - Clam  - 1 x daily - 7 x weekly - 3 sets - 10 reps - 5" hold -Supine Thomas stretch 3x 30" with towel assistance -ASSESSMENT:  CLINICAL IMPRESSION: 10/02/22:  Added postural strengthening and standing hip flexor stretches.  Monitored fatigue levels with occasional seated rest breaks required.  No reports of increased pain through session.  Eval: Patient is a 52 y.o. female who was seen today for physical therapy evaluation and treatment for low back pain that radiates down right leg.  Patient  presents to physical therapy with complaint of back pain and leg and back weakness. Patient demonstrates muscle weakness, reduced ROM, and fascial restrictions which are likely contributing to symptoms of pain and are negatively impacting patient ability to perform ADLs and functional mobility tasks. Patient will benefit from skilled physical therapy services to address these deficits to  reduce pain and improve level of function with ADLs and functional mobility tasks.    OBJECTIVE IMPAIRMENTS: Abnormal gait, decreased activity tolerance, decreased mobility, difficulty walking, decreased ROM, decreased strength, hypomobility, increased fascial restrictions, impaired perceived functional ability, postural dysfunction, and pain.   ACTIVITY LIMITATIONS: carrying, lifting, bending, sitting, standing, squatting, sleeping, stairs, transfers, locomotion level, and caring for others  PARTICIPATION LIMITATIONS: meal prep, cleaning, laundry, shopping, community activity, and yard work  PERSONAL FACTORS: 1 comorbidity: MS  are also affecting patient's functional outcome.   REHAB POTENTIAL: Good  CLINICAL DECISION MAKING: Stable/uncomplicated  EVALUATION COMPLEXITY: Low   GOALS: Goals reviewed with patient? No  SHORT TERM GOALS: Target date: 10/04/2022  patient will be independent with initial HEP   Baseline: Goal status: IN PROGRESS  2.  Patient will self report 30% improvement to improve tolerance for functional activity  Baseline:  Goal status: IN PROGRESS    LONG TERM GOALS: Target date: 10/18/2022  Patient will be independent in self management strategies to improve quality of life and functional outcomes.   Baseline:  Goal status: IN PROGRESS  2.  Patient will self report 50% improvement to improve tolerance for functional activity  Baseline:  Goal status: IN PROGRESS  3.  Patient will improve FOTO score to predicted value    Baseline: 52 Goal status: IN PROGRESS  4.  Patient will increase distance on to 275 ft  to demonstrate improved functional mobility walking household and community distances.   Baseline: 224 ft Goal status: IN PROGRESS  5.  Patient will increase  leg MMTs to 4-5/5 without pain to promote return to ambulation community distances with minimal deviation.  Baseline: see above Goal status: IN PROGRESS  PLAN:  PT  FREQUENCY: 2x/week  PT DURATION: 4 weeks  PLANNED INTERVENTIONS: Therapeutic exercises, Therapeutic activity, Neuromuscular re-education, Balance training, Gait training, Patient/Family education, Joint manipulation, Joint mobilization, Stair training, Orthotic/Fit training, DME instructions, Aquatic Therapy, Dry Needling, Electrical stimulation, Spinal manipulation, Spinal mobilization, Cryotherapy, Moist heat, Compression bandaging, scar mobilization, Splintting, Taping, Traction, Ultrasound, Ionotophoresis 4mg /ml Dexamethasone, and Manual therapy .  PLAN FOR NEXT SESSION:  Progress lumbar extension, core and lower extremity strengthening; glutes and postural strengthening. Becky Sax, LPTA/CLT; Rowe Clack [phone]  2:33 PM, 10/02/22

## 2022-10-04 DIAGNOSIS — M5416 Radiculopathy, lumbar region: Secondary | ICD-10-CM | POA: Diagnosis not present

## 2022-10-05 ENCOUNTER — Encounter (HOSPITAL_COMMUNITY): Payer: 59 | Admitting: Physical Therapy

## 2022-10-09 ENCOUNTER — Encounter: Payer: Self-pay | Admitting: Family Medicine

## 2022-10-09 ENCOUNTER — Ambulatory Visit (HOSPITAL_COMMUNITY): Payer: 59

## 2022-10-09 ENCOUNTER — Encounter (HOSPITAL_COMMUNITY): Payer: Self-pay

## 2022-10-09 DIAGNOSIS — G35 Multiple sclerosis: Secondary | ICD-10-CM | POA: Diagnosis not present

## 2022-10-09 DIAGNOSIS — R2689 Other abnormalities of gait and mobility: Secondary | ICD-10-CM

## 2022-10-09 DIAGNOSIS — M6281 Muscle weakness (generalized): Secondary | ICD-10-CM

## 2022-10-09 DIAGNOSIS — M545 Low back pain, unspecified: Secondary | ICD-10-CM | POA: Diagnosis not present

## 2022-10-09 DIAGNOSIS — M549 Dorsalgia, unspecified: Secondary | ICD-10-CM | POA: Diagnosis not present

## 2022-10-09 DIAGNOSIS — R262 Difficulty in walking, not elsewhere classified: Secondary | ICD-10-CM

## 2022-10-09 NOTE — Therapy (Signed)
OUTPATIENT PHYSICAL THERAPY THORACOLUMBAR TREATMENT   Patient Name: Melinda Moore MRN: [id number] DOB:21-Dec-1970, 52 y.o., female Today's Date: 10/09/2022  END OF SESSION:  PT End of Session - 10/09/22 1343     Visit Number 5    Number of Visits 8    Date for PT Re-Evaluation 10/18/22    Authorization Type [employer]    Authorization - Number of Visits 60    PT Start Time 1303    PT Stop Time 1341    PT Time Calculation (min) 38 min    Activity Tolerance Patient limited by fatigue;Patient tolerated treatment well    Behavior During Therapy [facility] for tasks assessed/performed             Past Medical History:  Diagnosis Date   Anemia    Anxiety    Bell's palsy    Depression    DVT (deep venous thrombosis) (HCC) 09/02/2012   [place] on 02/13/2012 showed acute DVT in left calf veins and posterior tibial veins   Fibromyalgia    GERD (gastroesophageal reflux disease)    History of blood transfusion 2000   History of trigeminal neuralgia    HTN (hypertension)    patient denies was taking a blood pressure medication in early 2000 but it was migraines   Leukopenia    MS (multiple sclerosis) (HCC) 1997   Neuropathic pain    Peripheral edema    Port catheter in place 12/31/2012   Right bundle branch block    Past Surgical History:  Procedure Laterality Date   ABDOMINAL HYSTERECTOMY     CHOLECYSTECTOMY     COLONOSCOPY WITH PROPOFOL N/A 09/27/2021   Procedure: COLONOSCOPY WITH PROPOFOL;  Surgeon: Corbin Ade, MD;  Location: [facility];  Service: Endoscopy;  Laterality: N/A;  8:00am, asa 2   ESOPHAGOGASTRODUODENOSCOPY  2011   Dr. Jena Gauss. 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 Biopsy from the stomach revealed minimal chronic inactive inflammation but negative for H. pylori   FLEXIBLE SIGMOIDOSCOPY N/A 02/08/2022   Procedure: FLEXIBLE SIGMOIDOSCOPY;  Surgeon: Corbin Ade, MD;  Location: [facility];  Service: Endoscopy;  Laterality: N/A;   LAPAROSCOPIC GASTRIC SLEEVE RESECTION N/A 04/17/2016   Procedure: LAPAROSCOPIC GASTRIC SLEEVE RESECTION WITH UPPER ENDOSCOPY;  Surgeon: Glenna Fellows, MD;  Location: [facility];  Service: General;  Laterality: N/A;   PATELLA-FEMORAL ARTHROPLASTY Right 07/14/2019   Procedure: PATELLA-FEMORAL ARTHROPLASTY;  Surgeon: Teryl Lucy, MD;  Location: [facility];  Service: Orthopedics;  Laterality: Right;   PORTACATH PLACEMENT     TUBAL LIGATION  2001   Patient Active Problem List   Diagnosis Date Noted   Chronic low back pain (1ry area of Pain) (Bilateral) (R>L) w/o sciatica 07/18/2022   Lumbar facet arthropathy (Bilateral) 07/18/2022   Lumbar facet joint pain 07/18/2022   Chronic lower extremity pain (2ry area of Pain) (Right) 07/18/2022   DDD (degenerative disc disease), lumbosacral 07/18/2022   Abnormal MRI, lumbar spine (10/11/2017) 07/18/2022   Lumbosacral foraminal stenosis (Left: L5-S1) 07/18/2022   Obesity, Class II, BMI 35-39.9 07/18/2022   Osteoarthritis of facet joint of lumbar spine 07/18/2022   Primary osteoarthritis of lumbar spine 07/18/2022   History of total knee replacement (Right) 07/18/2022   Chronic knee pain s/p TKR (Right) 07/18/2022   Chronic medial knee pain (Right) 07/18/2022   Decreased range of motion of lumbar spine 07/18/2022   Pharmacologic therapy 07/15/2022   Disorder of skeletal system 07/15/2022  Problems influencing health status 07/15/2022   Gastroesophageal reflux disease 09/07/2021   Colon cancer screening 09/07/2021   Hx of hysterectomy 06/14/2021   Elevated transaminase level 02/04/2020   Patellofemoral arthritis of knee (Right) 07/14/2019   History of DVT in adulthood 06/23/2019   Gait abnormality 03/12/2017   Radiculopathy due to lumbar intervertebral disc disorder 09/21/2016   Spondylosis without myelopathy or radiculopathy, lumbar region 09/21/2016   History of anxiety 09/08/2016   Primary  osteoarthritis of knee (Right) 08/30/2016   Primary insomnia 08/30/2016   Myofascial pain 08/30/2016   Sciatica, right side 12/12/2015   Morbid obesity (HCC) 08/12/2015   Chronic anxiety 07/08/2013   Swelling of breast 04/08/2013   Chronic pain syndrome 03/11/2013   Port catheter in place 12/31/2012   Constipation 10/01/2012   Anemia 10/01/2012   Rectal bleeding 10/01/2012   Elevated alkaline phosphatase level 10/01/2012   MS (multiple sclerosis) (HCC) 09/02/2012   Anxiety 11/29/2011   Headache(784.0) 11/29/2011   ANEMIA 04/12/2010   Other dysphagia 04/12/2010    PCP: Lilyan Punt, MD  REFERRING PROVIDER: Babs Sciara, MD  REFERRING DIAG: M54.9 (ICD-10-CM) - Back pain, unspecified back location, unspecified back pain laterality, unspecified chronicity  Rationale for Evaluation and Treatment: Rehabilitation  THERAPY DIAG:  Low back pain, unspecified back pain laterality, unspecified chronicity, unspecified whether sciatica present  Muscle weakness (generalized)  Difficulty in walking, not elsewhere classified  Other abnormalities of gait and mobility  ONSET DATE: worse since 2017   SUBJECTIVE:                                                                                                                                                                                           SUBJECTIVE STATEMENT: 10/09/22:  Stated she has run errands prior session with increased fatigue and LBP, pain scale 8/10 and fatigue at 5-6/10 at entrance today.  Eval: Diagnosis with MS years ago; sees neurologist in [facility] ; has generally done well but in 2017 started having more issues; after TKA and COVD " I just haven't bounced back"; reports stiffness, back pain and leg pain; MS has affected Right side more.  Pt accompanied by: self   PERTINENT HISTORY:   seeing pain management at [facility], Dr. Jola Schmidt for injections LBP;  TKA 2021 R ; then COVD shortly at the end of 2021  x  2 week in the hospital Also had some injections in the back per Dr. Murray Hodgkins that did help some   PAIN:  Are you having pain? Yes: NPRS scale: 7/10 Pain location: Right leg Pain description: constant throbbing Aggravating factors: lots of activity Relieving factors:  medication   PAIN:  Are you having pain? Yes: NPRS scale: 6/10 Pain location: low back and right leg Pain description: throbbing and nagging aching; constant Aggravating factors: activity Relieving factors: rest, injections  PRECAUTIONS: Fall  WEIGHT BEARING RESTRICTIONS: No  FALLS:  Has patient fallen in last 6 months? No  OCCUPATION: not working  PLOF: Independent with basic ADLs  PATIENT GOALS: get off some of these pain meds  NEXT MD VISIT: 10/04/22 Dr. Hetty Ely  OBJECTIVE:   DIAGNOSTIC FINDINGS:  CLINICAL DATA:  Low back pain. Chronic bilateral low back pain without sciatica. Lumbar facet arthropathy. Lumbar facet joint pain. Degenerative disc disease.   EXAM: LUMBAR SPINE - COMPLETE WITH BENDING VIEWS   COMPARISON:  Lumbar MRI 10/11/2017   FINDINGS: AP, bilateral oblique, lateral, lateral flexion, lateral extension and lateral L5-S1 spot views obtained. 5 non-rib-bearing lumbar vertebra with tiny ribs at L1 as before.   There is 4-5 mm of anterolisthesis of L4 on L5 present on flexion and extension, this is 0 mm on neutral imaging. Alignment is otherwise normal. Vertebral body heights are normal. There is mild L3-L4 and L4-L5 disc space narrowing and spurring. Mild L3-L4, moderate at L4-L5 and mild L5-S1 facet hypertrophy. No evidence of fracture focal bone abnormalities. The sacroiliac joints are congruent.   IMPRESSION: 1. Degenerative disc disease and facet arthropathy at L3-L4 and L4-L5. 2. Grade 1 anterolisthesis of L4 on L5 measuring 4-5 mm on flexion and extension, 0 mm on neutral imaging.     Electronically Signed   By: Narda Rutherford M.D.   On: 07/20/2022 15:18    PATIENT  SURVEYS:  FOTO 52  COGNITION: Overall cognitive status: Within functional limits for tasks assessed     SENSATION: Neuropathy both fee  POSTURE: rounded shoulders, forward head, and flexed trunk   PALPATION:   LUMBAR ROM:   AROM eval  Flexion 60% available  Extension Unable to get to neutral  Right lateral flexion   Left lateral flexion   Right rotation   Left rotation    (Blank rows = not tested)   LOWER EXTREMITY MMT:    MMT Right eval Left eval  Hip flexion 4 4  Hip extension 2 2  Hip abduction    Hip adduction    Hip internal rotation    Hip external rotation    Knee flexion 3- 3-  Knee extension 4 4  Ankle dorsiflexion 4+ 4+  Ankle plantarflexion    Ankle inversion    Ankle eversion     (Blank rows = not tested)   FUNCTIONAL TESTS:  5 times sit to stand: 18.18 sec using arm to push up on thighs.  2 minute walk test: 224 ft (had to stop before time was up; about 36 sec less)  GAIT: Distance walked: 224 ft Assistive device utilized: None Level of assistance: Modified independence Comments: forward flexed trunk; increased back pain  TODAY'S TREATMENT:  DATE: 10/09/22:  GTB  shoulder extension 10x  Rows 10x  3D hip excursion (Squat then extension 10x front of chair for mechanics, lateral weight shifting with UE reach, rotation)  Hip flexor stance on 12in step 3x30"  Wall arch 10x    10/02/22: Prone press up 10x 10"  Standing: Squat then extension 10x front of chair for mechanics RTB shoulder extension 10x  Rows 10x   Hip flexor stance on 12in step 3x20"  STS 10x eccentric control  09/28/22:  Sidelying abduction 10x Supine: Bridge 2sets 10x Thomas stretch on edge of mat 2 x 30" towel assistance Prone press up 10x 10" Rest break infront of fan to address fatigue STS 5x Standing lumbar  extension  09/26/22: Reviewed goals, discussed benefits with HEP compliance for maximal benefits. 2 sets x 5 reps STS with eccentric control Standing lumbar extension Prone press up 10x 30" holds Glut sets 10x 5" Attempted knee bent pushing foot towards ceiling- too difficult Supine:  Bridge 10x 5" eccentric lowering Marching 10x5 " with ab set Sidelying: clam    09/20/22 physical therapy evaluation and HEP instruction    PATIENT EDUCATION:  Education details: Patient educated on exam findings, POC, scope of PT, HEP, and what to expect next visit. Person educated: Patient Education method: Explanation, Demonstration, and Handouts Education comprehension: verbalized understanding, returned demonstration, verbal cues required, and tactile cues required  HOME EXERCISE PROGRAM: Access Code: [email or web address] URL: [email or web address] Date: 09/20/2022 Prepared by: [facility]  Exercises - Static Prone on Elbows  - 2 x daily - 7 x weekly - 1 sets - 1 reps - 3 min hold - Prone Press Up  - 2 x daily - 7 x weekly - 1 sets - 5 reps - Prone Gluteal Sets  - 2 x daily - 7 x weekly - 1 sets - 10 reps - 5" hold - Supine Bridge  - 2 x daily - 7 x weekly - 3 sets - 10 reps  Access Code: [email or web address] URL: [email or web address] Date: 09/28/2022 Prepared by: Becky Sax  Exercises - Static Prone on Elbows  - 2 x daily - 7 x weekly - 1 sets - 1 reps - 3 min hold - Prone Press Up  - 2 x daily - 7 x weekly - 1 sets - 5 reps - Prone Gluteal Sets  - 2 x daily - 7 x weekly - 1 sets - 10 reps - 5" hold - Supine Bridge  - 2 x daily - 7 x weekly - 3 sets - 10 reps - Supine Lower Trunk Rotation  - 2 x daily - 7 x weekly - 1 sets - 10 reps - Supine Transversus Abdominis Bracing - Hands on Stomach  - 2 x daily - 7 x weekly - 1 sets - 10 reps - Prone Hip Extension  - 1 x daily - 7 x weekly - 1-2 sets - 10 reps- when ready - Clam  - 1 x daily - 7 x weekly - 3 sets - 10 reps - 5"  hold -Supine Thomas stretch 3x 30" with towel assistance -ASSESSMENT:  CLINICAL IMPRESSION: 10/09/22:  Increased rest breaks required due to increased fatigue at beginning of session.  Monitored fatigue levels through session.  Continued session focus with core and proximal strengthening and mobility exercises to address stiffness and improve posture.  Cueing required through session to address flexion at the trunk and improve posture.    Eval: Patient is a 52 y.o. female who was seen today  for physical therapy evaluation and treatment for low back pain that radiates down right leg.  Patient  presents to physical therapy with complaint of back pain and leg and back weakness. Patient demonstrates muscle weakness, reduced ROM, and fascial restrictions which are likely contributing to symptoms of pain and are negatively impacting patient ability to perform ADLs and functional mobility tasks. Patient will benefit from skilled physical therapy services to address these deficits to reduce pain and improve level of function with ADLs and functional mobility tasks.    OBJECTIVE IMPAIRMENTS: Abnormal gait, decreased activity tolerance, decreased mobility, difficulty walking, decreased ROM, decreased strength, hypomobility, increased fascial restrictions, impaired perceived functional ability, postural dysfunction, and pain.   ACTIVITY LIMITATIONS: carrying, lifting, bending, sitting, standing, squatting, sleeping, stairs, transfers, locomotion level, and caring for others  PARTICIPATION LIMITATIONS: meal prep, cleaning, laundry, shopping, community activity, and yard work  PERSONAL FACTORS: 1 comorbidity: MS  are also affecting patient's functional outcome.   REHAB POTENTIAL: Good  CLINICAL DECISION MAKING: Stable/uncomplicated  EVALUATION COMPLEXITY: Low   GOALS: Goals reviewed with patient? No  SHORT TERM GOALS: Target date: 10/04/2022  patient will be independent with initial  HEP   Baseline: Goal status: IN PROGRESS  2.  Patient will self report 30% improvement to improve tolerance for functional activity  Baseline:  Goal status: IN PROGRESS    LONG TERM GOALS: Target date: 10/18/2022  Patient will be independent in self management strategies to improve quality of life and functional outcomes.   Baseline:  Goal status: IN PROGRESS  2.  Patient will self report 50% improvement to improve tolerance for functional activity  Baseline:  Goal status: IN PROGRESS  3.  Patient will improve FOTO score to predicted value    Baseline: 52 Goal status: IN PROGRESS  4.  Patient will increase distance on to 275 ft  to demonstrate improved functional mobility walking household and community distances.   Baseline: 224 ft Goal status: IN PROGRESS  5.  Patient will increase  leg MMTs to 4-5/5 without pain to promote return to ambulation community distances with minimal deviation.  Baseline: see above Goal status: IN PROGRESS  PLAN:  PT FREQUENCY: 2x/week  PT DURATION: 4 weeks  PLANNED INTERVENTIONS: Therapeutic exercises, Therapeutic activity, Neuromuscular re-education, Balance training, Gait training, Patient/Family education, Joint manipulation, Joint mobilization, Stair training, Orthotic/Fit training, DME instructions, Aquatic Therapy, Dry Needling, Electrical stimulation, Spinal manipulation, Spinal mobilization, Cryotherapy, Moist heat, Compression bandaging, scar mobilization, Splintting, Taping, Traction, Ultrasound, Ionotophoresis 4mg /ml Dexamethasone, and Manual therapy .  PLAN FOR NEXT SESSION:  Progress lumbar extension, core and lower extremity strengthening; glutes and postural strengthening. Becky Sax, LPTA/CLT; CBIS [phone]  1:47 PM, 10/09/22

## 2022-10-10 NOTE — Telephone Encounter (Signed)
Nurses May do prednisone 20 mg 3 daily for 2 days, 2 daily for 2 days, 1 daily for 2 days  If ongoing troubles please follow-up

## 2022-10-11 ENCOUNTER — Telehealth: Payer: Self-pay | Admitting: Family Medicine

## 2022-10-11 NOTE — Telephone Encounter (Signed)
[employer] sent over another form to be completed just waiting on payment from them  so you can complete form

## 2022-10-12 ENCOUNTER — Encounter (HOSPITAL_COMMUNITY): Payer: 59

## 2022-10-12 NOTE — Telephone Encounter (Signed)
This particular form is just more of a clarification form.  We have previously filled out her disability form. Please send a copy of the office visit note from September 20, 2022 with this form.  I have completed my portion of this form.

## 2022-10-15 MED ORDER — PREDNISONE 20 MG PO TABS: ORAL_TABLET | ORAL | 0 refills | Status: DC

## 2022-10-16 ENCOUNTER — Ambulatory Visit (HOSPITAL_COMMUNITY): Payer: 59 | Attending: Family Medicine

## 2022-10-16 DIAGNOSIS — G35 Multiple sclerosis: Secondary | ICD-10-CM | POA: Diagnosis not present

## 2022-10-16 DIAGNOSIS — M545 Low back pain, unspecified: Secondary | ICD-10-CM | POA: Diagnosis not present

## 2022-10-16 DIAGNOSIS — M6281 Muscle weakness (generalized): Secondary | ICD-10-CM | POA: Insufficient documentation

## 2022-10-16 DIAGNOSIS — R2689 Other abnormalities of gait and mobility: Secondary | ICD-10-CM | POA: Insufficient documentation

## 2022-10-16 DIAGNOSIS — R262 Difficulty in walking, not elsewhere classified: Secondary | ICD-10-CM | POA: Diagnosis not present

## 2022-10-16 NOTE — Therapy (Signed)
OUTPATIENT PHYSICAL THERAPY THORACOLUMBAR TREATMENT   Patient Name: Melinda Moore MRN: [id number] DOB:[birth year], 52 y.o., female Today's Date: 10/16/2022  END OF SESSION:  PT End of Session - 10/16/22 1036     Visit Number 6    Number of Visits 8    Date for PT Re-Evaluation 10/18/22    Authorization Type [employer]    Authorization - Number of Visits 60    PT Start Time 1035    PT Stop Time 1115    PT Time Calculation (min) 40 min    Activity Tolerance Patient limited by fatigue;Patient tolerated treatment well    Behavior During Therapy [facility] for tasks assessed/performed             Past Medical History:  Diagnosis Date   Anemia    Anxiety    Bell's palsy    Depression    DVT (deep venous thrombosis) (HCC) 09/02/2012   [place] on 02/13/2012 showed acute DVT in left calf veins and posterior tibial veins   Fibromyalgia    GERD (gastroesophageal reflux disease)    History of blood transfusion 2000   History of trigeminal neuralgia    HTN (hypertension)    patient denies was taking a blood pressure medication in early 2000 but it was migraines   Leukopenia    MS (multiple sclerosis) (HCC) 1997   Neuropathic pain    Peripheral edema    Port catheter in place 12/31/2012   Right bundle branch block    Past Surgical History:  Procedure Laterality Date   ABDOMINAL HYSTERECTOMY     CHOLECYSTECTOMY     COLONOSCOPY WITH PROPOFOL N/A 09/27/2021   Procedure: COLONOSCOPY WITH PROPOFOL;  Surgeon: Corbin Ade, MD;  Location: [facility];  Service: Endoscopy;  Laterality: N/A;  8:00am, asa 2   ESOPHAGOGASTRODUODENOSCOPY  2011   Dr. Jena Gauss. 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 Biopsy from the stomach revealed minimal chronic inactive inflammation but negative for H. pylori   FLEXIBLE SIGMOIDOSCOPY N/A 02/08/2022   Procedure: FLEXIBLE SIGMOIDOSCOPY;  Surgeon: Corbin Ade, MD;  Location: [facility];  Service: Endoscopy;  Laterality: N/A;   LAPAROSCOPIC GASTRIC SLEEVE RESECTION N/A 04/17/2016   Procedure: LAPAROSCOPIC GASTRIC SLEEVE RESECTION WITH UPPER ENDOSCOPY;  Surgeon: Glenna Fellows, MD;  Location: [facility];  Service: General;  Laterality: N/A;   PATELLA-FEMORAL ARTHROPLASTY Right 07/14/2019   Procedure: PATELLA-FEMORAL ARTHROPLASTY;  Surgeon: Teryl Lucy, MD;  Location: [facility];  Service: Orthopedics;  Laterality: Right;   PORTACATH PLACEMENT     TUBAL LIGATION  2001   Patient Active Problem List   Diagnosis Date Noted   Chronic low back pain (1ry area of Pain) (Bilateral) (R>L) w/o sciatica 07/18/2022   Lumbar facet arthropathy (Bilateral) 07/18/2022   Lumbar facet joint pain 07/18/2022   Chronic lower extremity pain (2ry area of Pain) (Right) 07/18/2022   DDD (degenerative disc disease), lumbosacral 07/18/2022   Abnormal MRI, lumbar spine (10/11/2017) 07/18/2022   Lumbosacral foraminal stenosis (Left: L5-S1) 07/18/2022   Obesity, Class II, BMI 35-39.9 07/18/2022   Osteoarthritis of facet joint of lumbar spine 07/18/2022   Primary osteoarthritis of lumbar spine 07/18/2022   History of total knee replacement (Right) 07/18/2022   Chronic knee pain s/p TKR (Right) 07/18/2022   Chronic medial knee pain (Right) 07/18/2022   Decreased range of motion of lumbar spine 07/18/2022   Pharmacologic therapy 07/15/2022   Disorder of skeletal system 07/15/2022  Problems influencing health status 07/15/2022   Gastroesophageal reflux disease 09/07/2021   Colon cancer screening 09/07/2021   Hx of hysterectomy 06/14/2021   Elevated transaminase level 02/04/2020   Patellofemoral arthritis of knee (Right) 07/14/2019   History of DVT in adulthood 06/23/2019   Gait abnormality 03/12/2017   Radiculopathy due to lumbar intervertebral disc disorder 09/21/2016   Spondylosis without myelopathy or radiculopathy, lumbar region 09/21/2016   History of anxiety 09/08/2016   Primary  osteoarthritis of knee (Right) 08/30/2016   Primary insomnia 08/30/2016   Myofascial pain 08/30/2016   Sciatica, right side 12/12/2015   Morbid obesity (HCC) 08/12/2015   Chronic anxiety 07/08/2013   Swelling of breast 04/08/2013   Chronic pain syndrome 03/11/2013   Port catheter in place 12/31/2012   Constipation 10/01/2012   Anemia 10/01/2012   Rectal bleeding 10/01/2012   Elevated alkaline phosphatase level 10/01/2012   MS (multiple sclerosis) (HCC) 09/02/2012   Anxiety 11/29/2011   Headache(784.0) 11/29/2011   ANEMIA 04/12/2010   Other dysphagia 04/12/2010    PCP: Lilyan Punt, MD  REFERRING PROVIDER: Babs Sciara, MD  REFERRING DIAG: M54.9 (ICD-10-CM) - Back pain, unspecified back location, unspecified back pain laterality, unspecified chronicity  Rationale for Evaluation and Treatment: Rehabilitation  THERAPY DIAG:  Low back pain, unspecified back pain laterality, unspecified chronicity, unspecified whether sciatica present  Muscle weakness (generalized)  Difficulty in walking, not elsewhere classified  Other abnormalities of gait and mobility  Multiple sclerosis (HCC)  ONSET DATE: worse since 2017   SUBJECTIVE:                                                                                                                                                                                           SUBJECTIVE STATEMENT: Feeling stiff this morning; normally takes to about lunchtime to loosen up some.  No real pain  Eval: Diagnosis with MS years ago; sees neurologist in [facility] ; has generally done well but in 2017 started having more issues; after TKA and COVD " I just haven't bounced back"; reports stiffness, back pain and leg pain; MS has affected Right side more.  Pt accompanied by: self   PERTINENT HISTORY:   seeing pain management at [facility], Dr. Jola Schmidt for injections LBP;  TKA 2021 R ; then COVD shortly at the end of 2021  x 2 week in the  hospital Also had some injections in the back per Dr. Murray Hodgkins that did help some   PAIN:  Are you having pain? Yes: NPRS scale: 7/10 Pain location: Right leg Pain description: constant throbbing Aggravating factors: lots of activity Relieving factors: medication  PAIN:  Are you having pain? Yes: NPRS scale: 6/10 Pain location: low back and right leg Pain description: throbbing and nagging aching; constant Aggravating factors: activity Relieving factors: rest, injections  PRECAUTIONS: Fall  WEIGHT BEARING RESTRICTIONS: No  FALLS:  Has patient fallen in last 6 months? No  OCCUPATION: not working  PLOF: Independent with basic ADLs  PATIENT GOALS: get off some of these pain meds  NEXT MD VISIT: 10/04/22 Dr. Hetty Ely  OBJECTIVE:   DIAGNOSTIC FINDINGS:  CLINICAL DATA:  Low back pain. Chronic bilateral low back pain without sciatica. Lumbar facet arthropathy. Lumbar facet joint pain. Degenerative disc disease.   EXAM: LUMBAR SPINE - COMPLETE WITH BENDING VIEWS   COMPARISON:  Lumbar MRI 10/11/2017   FINDINGS: AP, bilateral oblique, lateral, lateral flexion, lateral extension and lateral L5-S1 spot views obtained. 5 non-rib-bearing lumbar vertebra with tiny ribs at L1 as before.   There is 4-5 mm of anterolisthesis of L4 on L5 present on flexion and extension, this is 0 mm on neutral imaging. Alignment is otherwise normal. Vertebral body heights are normal. There is mild L3-L4 and L4-L5 disc space narrowing and spurring. Mild L3-L4, moderate at L4-L5 and mild L5-S1 facet hypertrophy. No evidence of fracture focal bone abnormalities. The sacroiliac joints are congruent.   IMPRESSION: 1. Degenerative disc disease and facet arthropathy at L3-L4 and L4-L5. 2. Grade 1 anterolisthesis of L4 on L5 measuring 4-5 mm on flexion and extension, 0 mm on neutral imaging.     Electronically Signed   By: Narda Rutherford M.D.   On: 07/20/2022 15:18    PATIENT SURVEYS:  FOTO  52  COGNITION: Overall cognitive status: Within functional limits for tasks assessed     SENSATION: Neuropathy both fee  POSTURE: rounded shoulders, forward head, and flexed trunk   PALPATION:   LUMBAR ROM:   AROM eval  Flexion 60% available  Extension Unable to get to neutral  Right lateral flexion   Left lateral flexion   Right rotation   Left rotation    (Blank rows = not tested)   LOWER EXTREMITY MMT:    MMT Right eval Left eval  Hip flexion 4 4  Hip extension 2 2  Hip abduction    Hip adduction    Hip internal rotation    Hip external rotation    Knee flexion 3- 3-  Knee extension 4 4  Ankle dorsiflexion 4+ 4+  Ankle plantarflexion    Ankle inversion    Ankle eversion     (Blank rows = not tested)   FUNCTIONAL TESTS:  5 times sit to stand: 18.18 sec using arm to push up on thighs.  2 minute walk test: 224 ft (had to stop before time was up; about 36 sec less)  GAIT: Distance walked: 224 ft Assistive device utilized: None Level of assistance: Modified independence Comments: forward flexed trunk; increased back pain  TODAY'S TREATMENT:  DATE: 10/16/22 Standing: Heel raises x 10 Slant board 3 x 20" Hip vectors 2" hold; 2 x 5 each  GTB shoulder rows and ext 2 x 10 each Lumbar extension x 10  Sitting: GTB shoulder abduction 2 x 10 Thoracic extension over chair 2 x 10  Sit to stand 3 x 5 no UE assist    10/09/22:  GTB  shoulder extension 10x  Rows 10x  3D hip excursion (Squat then extension 10x front of chair for mechanics, lateral weight shifting with UE reach, rotation)  Hip flexor stance on 12in step 3x30"  Wall arch 10x    10/02/22: Prone press up 10x 10"  Standing: Squat then extension 10x front of chair for mechanics RTB shoulder extension 10x  Rows 10x   Hip flexor stance on 12in step 3x20"  STS 10x  eccentric control  09/28/22:  Sidelying abduction 10x Supine: Bridge 2sets 10x Thomas stretch on edge of mat 2 x 30" towel assistance Prone press up 10x 10" Rest break infront of fan to address fatigue STS 5x Standing lumbar extension  09/26/22: Reviewed goals, discussed benefits with HEP compliance for maximal benefits. 2 sets x 5 reps STS with eccentric control Standing lumbar extension Prone press up 10x 30" holds Glut sets 10x 5" Attempted knee bent pushing foot towards ceiling- too difficult Supine:  Bridge 10x 5" eccentric lowering Marching 10x5 " with ab set Sidelying: clam    09/20/22 physical therapy evaluation and HEP instruction    PATIENT EDUCATION:  Education details: Patient educated on exam findings, POC, scope of PT, HEP, and what to expect next visit. Person educated: Patient Education method: Explanation, Demonstration, and Handouts Education comprehension: verbalized understanding, returned demonstration, verbal cues required, and tactile cues required  HOME EXERCISE PROGRAM: Access Code: [email or web address] URL: [email or web address] Date: 09/20/2022 Prepared by: [facility]  Exercises - Static Prone on Elbows  - 2 x daily - 7 x weekly - 1 sets - 1 reps - 3 min hold - Prone Press Up  - 2 x daily - 7 x weekly - 1 sets - 5 reps - Prone Gluteal Sets  - 2 x daily - 7 x weekly - 1 sets - 10 reps - 5" hold - Supine Bridge  - 2 x daily - 7 x weekly - 3 sets - 10 reps  Access Code: [email or web address] URL: [email or web address] Date: 09/28/2022 Prepared by: Becky Sax  Exercises - Static Prone on Elbows  - 2 x daily - 7 x weekly - 1 sets - 1 reps - 3 min hold - Prone Press Up  - 2 x daily - 7 x weekly - 1 sets - 5 reps - Prone Gluteal Sets  - 2 x daily - 7 x weekly - 1 sets - 10 reps - 5" hold - Supine Bridge  - 2 x daily - 7 x weekly - 3 sets - 10 reps - Supine Lower Trunk Rotation  - 2 x daily - 7 x weekly - 1 sets - 10 reps - Supine  Transversus Abdominis Bracing - Hands on Stomach  - 2 x daily - 7 x weekly - 1 sets - 10 reps - Prone Hip Extension  - 1 x daily - 7 x weekly - 1-2 sets - 10 reps- when ready - Clam  - 1 x daily - 7 x weekly - 3 sets - 10 reps - 5" hold -Supine Thomas stretch 3x 30" with towel assistance -ASSESSMENT:  CLINICAL IMPRESSION: Today's session continued to work  on core, postural and lower extremity strength; able to perform sit to stand without use of hands to assist today.  Continues with forward flexed posturing but seems to loosen up as therapy session progresses. Continued limited hip extension noted.  Patient will benefit from continued skilled therapy services to address deficits and promote return to optimal function.       Eval: Patient is a 52 y.o. female who was seen today for physical therapy evaluation and treatment for low back pain that radiates down right leg.  Patient  presents to physical therapy with complaint of back pain and leg and back weakness. Patient demonstrates muscle weakness, reduced ROM, and fascial restrictions which are likely contributing to symptoms of pain and are negatively impacting patient ability to perform ADLs and functional mobility tasks. Patient will benefit from skilled physical therapy services to address these deficits to reduce pain and improve level of function with ADLs and functional mobility tasks.    OBJECTIVE IMPAIRMENTS: Abnormal gait, decreased activity tolerance, decreased mobility, difficulty walking, decreased ROM, decreased strength, hypomobility, increased fascial restrictions, impaired perceived functional ability, postural dysfunction, and pain.   ACTIVITY LIMITATIONS: carrying, lifting, bending, sitting, standing, squatting, sleeping, stairs, transfers, locomotion level, and caring for others  PARTICIPATION LIMITATIONS: meal prep, cleaning, laundry, shopping, community activity, and yard work  PERSONAL FACTORS: 1 comorbidity: MS  are also  affecting patient's functional outcome.   REHAB POTENTIAL: Good  CLINICAL DECISION MAKING: Stable/uncomplicated  EVALUATION COMPLEXITY: Low   GOALS: Goals reviewed with patient? No  SHORT TERM GOALS: Target date: 10/04/2022  patient will be independent with initial HEP   Baseline: Goal status: IN PROGRESS  2.  Patient will self report 30% improvement to improve tolerance for functional activity  Baseline:  Goal status: IN PROGRESS    LONG TERM GOALS: Target date: 10/18/2022  Patient will be independent in self management strategies to improve quality of life and functional outcomes.   Baseline:  Goal status: IN PROGRESS  2.  Patient will self report 50% improvement to improve tolerance for functional activity  Baseline:  Goal status: IN PROGRESS  3.  Patient will improve FOTO score to predicted value    Baseline: 52 Goal status: IN PROGRESS  4.  Patient will increase distance on to 275 ft  to demonstrate improved functional mobility walking household and community distances.   Baseline: 224 ft Goal status: IN PROGRESS  5.  Patient will increase  leg MMTs to 4-5/5 without pain to promote return to ambulation community distances with minimal deviation.  Baseline: see above Goal status: IN PROGRESS  PLAN:  PT FREQUENCY: 2x/week  PT DURATION: 4 weeks  PLANNED INTERVENTIONS: Therapeutic exercises, Therapeutic activity, Neuromuscular re-education, Balance training, Gait training, Patient/Family education, Joint manipulation, Joint mobilization, Stair training, Orthotic/Fit training, DME instructions, Aquatic Therapy, Dry Needling, Electrical stimulation, Spinal manipulation, Spinal mobilization, Cryotherapy, Moist heat, Compression bandaging, scar mobilization, Splintting, Taping, Traction, Ultrasound, Ionotophoresis 4mg /ml Dexamethasone, and Manual therapy .  PLAN FOR NEXT SESSION:  Progress lumbar extension, core and lower extremity strengthening;  glutes and postural strengthening.   11:15 AM, 10/16/22 Kaled Allende Small Averil Digman MPT [place] [facility] [place] [phone]

## 2022-10-18 ENCOUNTER — Ambulatory Visit (HOSPITAL_COMMUNITY): Payer: 59

## 2022-10-18 DIAGNOSIS — M6281 Muscle weakness (generalized): Secondary | ICD-10-CM | POA: Diagnosis not present

## 2022-10-18 DIAGNOSIS — R262 Difficulty in walking, not elsewhere classified: Secondary | ICD-10-CM | POA: Diagnosis not present

## 2022-10-18 DIAGNOSIS — M545 Low back pain, unspecified: Secondary | ICD-10-CM | POA: Diagnosis not present

## 2022-10-18 DIAGNOSIS — R2689 Other abnormalities of gait and mobility: Secondary | ICD-10-CM | POA: Diagnosis not present

## 2022-10-18 DIAGNOSIS — G35 Multiple sclerosis: Secondary | ICD-10-CM | POA: Diagnosis not present

## 2022-10-18 NOTE — Therapy (Signed)
OUTPATIENT PHYSICAL THERAPY THORACOLUMBAR TREATMENT/ PROGRESS NOTE  Progress Note Reporting Period 09/20/2022 to 10/18/2022  See note below for Objective Data and Assessment of Progress/Goals.       Patient Name: Melinda Moore MRN: [id number] DOB:05/21/1971, 52 y.o., female Today's Date: 10/18/2022  END OF SESSION:  PT End of Session - 10/18/22 1253     Visit Number 7    Number of Visits 8    Date for PT Re-Evaluation 11/16/22    Authorization Type [employer]    Authorization - Number of Visits 60    PT Start Time 1300    PT Stop Time 1340    PT Time Calculation (min) 40 min    Activity Tolerance Patient limited by fatigue;Patient tolerated treatment well    Behavior During Therapy [facility] for tasks assessed/performed             Past Medical History:  Diagnosis Date   Anemia    Anxiety    Bell's palsy    Depression    DVT (deep venous thrombosis) (HCC) 09/02/2012   [place] on 02/13/2012 showed acute DVT in left calf veins and posterior tibial veins   Fibromyalgia    GERD (gastroesophageal reflux disease)    History of blood transfusion 2000   History of trigeminal neuralgia    HTN (hypertension)    patient denies was taking a blood pressure medication in early 2000 but it was migraines   Leukopenia    MS (multiple sclerosis) (HCC) 1997   Neuropathic pain    Peripheral edema    Port catheter in place 12/31/2012   Right bundle branch block    Past Surgical History:  Procedure Laterality Date   ABDOMINAL HYSTERECTOMY     CHOLECYSTECTOMY     COLONOSCOPY WITH PROPOFOL N/A 09/27/2021   Procedure: COLONOSCOPY WITH PROPOFOL;  Surgeon: Corbin Ade, MD;  Location: [facility];  Service: Endoscopy;  Laterality: N/A;  8:00am, asa 2   ESOPHAGOGASTRODUODENOSCOPY  2011   Dr. Jena Gauss. 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 Biopsy from the stomach revealed minimal chronic inactive inflammation  but negative for H. pylori   FLEXIBLE SIGMOIDOSCOPY N/A 02/08/2022   Procedure: FLEXIBLE SIGMOIDOSCOPY;  Surgeon: Corbin Ade, MD;  Location: [facility];  Service: Endoscopy;  Laterality: N/A;   LAPAROSCOPIC GASTRIC SLEEVE RESECTION N/A 04/17/2016   Procedure: LAPAROSCOPIC GASTRIC SLEEVE RESECTION WITH UPPER ENDOSCOPY;  Surgeon: Glenna Fellows, MD;  Location: [facility];  Service: General;  Laterality: N/A;   PATELLA-FEMORAL ARTHROPLASTY Right 07/14/2019   Procedure: PATELLA-FEMORAL ARTHROPLASTY;  Surgeon: Teryl Lucy, MD;  Location: [facility];  Service: Orthopedics;  Laterality: Right;   PORTACATH PLACEMENT     TUBAL LIGATION  2001   Patient Active Problem List   Diagnosis Date Noted   Chronic low back pain (1ry area of Pain) (Bilateral) (R>L) w/o sciatica 07/18/2022   Lumbar facet arthropathy (Bilateral) 07/18/2022   Lumbar facet joint pain 07/18/2022   Chronic lower extremity pain (2ry area of Pain) (Right) 07/18/2022   DDD (degenerative disc disease), lumbosacral 07/18/2022   Abnormal MRI, lumbar spine (10/11/2017) 07/18/2022   Lumbosacral foraminal stenosis (Left: L5-S1) 07/18/2022   Obesity, Class II, BMI 35-39.9 07/18/2022   Osteoarthritis of facet joint of lumbar spine 07/18/2022   Primary osteoarthritis of lumbar spine 07/18/2022   History of total knee replacement (Right) 07/18/2022   Chronic knee pain s/p TKR (Right) 07/18/2022   Chronic medial knee  pain (Right) 07/18/2022   Decreased range of motion of lumbar spine 07/18/2022   Pharmacologic therapy 07/15/2022   Disorder of skeletal system 07/15/2022   Problems influencing health status 07/15/2022   Gastroesophageal reflux disease 09/07/2021   Colon cancer screening 09/07/2021   Hx of hysterectomy 06/14/2021   Elevated transaminase level 02/04/2020   Patellofemoral arthritis of knee (Right) 07/14/2019   History of DVT in adulthood 06/23/2019   Gait abnormality 03/12/2017   Radiculopathy due to lumbar intervertebral  disc disorder 09/21/2016   Spondylosis without myelopathy or radiculopathy, lumbar region 09/21/2016   History of anxiety 09/08/2016   Primary osteoarthritis of knee (Right) 08/30/2016   Primary insomnia 08/30/2016   Myofascial pain 08/30/2016   Sciatica, right side 12/12/2015   Morbid obesity (HCC) 08/12/2015   Chronic anxiety 07/08/2013   Swelling of breast 04/08/2013   Chronic pain syndrome 03/11/2013   Port catheter in place 12/31/2012   Constipation 10/01/2012   Anemia 10/01/2012   Rectal bleeding 10/01/2012   Elevated alkaline phosphatase level 10/01/2012   MS (multiple sclerosis) (HCC) 09/02/2012   Anxiety 11/29/2011   Headache(784.0) 11/29/2011   ANEMIA 04/12/2010   Other dysphagia 04/12/2010    PCP: Lilyan Punt, MD  REFERRING PROVIDER: Babs Sciara, MD  REFERRING DIAG: M54.9 (ICD-10-CM) - Back pain, unspecified back location, unspecified back pain laterality, unspecified chronicity  Rationale for Evaluation and Treatment: Rehabilitation  THERAPY DIAG:  Low back pain, unspecified back pain laterality, unspecified chronicity, unspecified whether sciatica present  Muscle weakness (generalized)  Difficulty in walking, not elsewhere classified  ONSET DATE: worse since 2017   SUBJECTIVE:                                                                                                                                                                                           SUBJECTIVE STATEMENT: Feeling pretty good today; today is a "good day"; pain about a 2/10; about "50%" better overall   Eval: Diagnosis with MS years ago; sees neurologist in [place] ; has generally done well but in 2017 started having more issues; after TKA and COVD " I just haven't bounced back"; reports stiffness, back pain and leg pain; MS has affected Right side more.  Pt accompanied by: self   PERTINENT HISTORY:   seeing pain management at [facility], Dr. Jola Schmidt for  injections LBP;  TKA 2021 R ; then COVD shortly at the end of 2021  x 2 week in the hospital Also had some injections in the back per Dr. Murray Hodgkins that did help some   PAIN:  Are you having pain? Yes: NPRS scale: 7/10 Pain  location: Right leg Pain description: constant throbbing Aggravating factors: lots of activity Relieving factors: medication   PAIN:  Are you having pain? Yes: NPRS scale: 6/10 Pain location: low back and right leg Pain description: throbbing and nagging aching; constant Aggravating factors: activity Relieving factors: rest, injections  PRECAUTIONS: Fall  WEIGHT BEARING RESTRICTIONS: No  FALLS:  Has patient fallen in last 6 months? No  OCCUPATION: not working  PLOF: Independent with basic ADLs  PATIENT GOALS: get off some of these pain meds  NEXT MD VISIT: 10/04/22 Dr. Hetty Ely; end of May sees Dr. Gerda Diss  OBJECTIVE:   DIAGNOSTIC FINDINGS:  CLINICAL DATA:  Low back pain. Chronic bilateral low back pain without sciatica. Lumbar facet arthropathy. Lumbar facet joint pain. Degenerative disc disease.   EXAM: LUMBAR SPINE - COMPLETE WITH BENDING VIEWS   COMPARISON:  Lumbar MRI 10/11/2017   FINDINGS: AP, bilateral oblique, lateral, lateral flexion, lateral extension and lateral L5-S1 spot views obtained. 5 non-rib-bearing lumbar vertebra with tiny ribs at L1 as before.   There is 4-5 mm of anterolisthesis of L4 on L5 present on flexion and extension, this is 0 mm on neutral imaging. Alignment is otherwise normal. Vertebral body heights are normal. There is mild L3-L4 and L4-L5 disc space narrowing and spurring. Mild L3-L4, moderate at L4-L5 and mild L5-S1 facet hypertrophy. No evidence of fracture focal bone abnormalities. The sacroiliac joints are congruent.   IMPRESSION: 1. Degenerative disc disease and facet arthropathy at L3-L4 and L4-L5. 2. Grade 1 anterolisthesis of L4 on L5 measuring 4-5 mm on flexion and extension, 0 mm on neutral  imaging.     Electronically Signed   By: Narda Rutherford M.D.   On: 07/20/2022 15:18    PATIENT SURVEYS:  FOTO 52  COGNITION: Overall cognitive status: Within functional limits for tasks assessed     SENSATION: Neuropathy both fee  POSTURE: rounded shoulders, forward head, and flexed trunk   PALPATION:   LUMBAR ROM:   AROM eval  Flexion 60% available  Extension Unable to get to neutral  Right lateral flexion   Left lateral flexion   Right rotation   Left rotation    (Blank rows = not tested)   LOWER EXTREMITY MMT:    MMT Right eval Left eval Right 10/18/22 Left 10/18/22  Hip flexion 4 4 4+ 4+  Hip extension 2 2    Hip abduction      Hip adduction      Hip internal rotation      Hip external rotation      Knee flexion 3- 3-    Knee extension 4 4 4 5   Ankle dorsiflexion 4+ 4+ 5 5  Ankle plantarflexion      Ankle inversion      Ankle eversion       (Blank rows = not tested)   FUNCTIONAL TESTS:  5 times sit to stand: 18.18 sec using arm to push up on thighs.  2 minute walk test: 224 ft (had to stop before time was up; about 36 sec less)  GAIT: Distance walked: 224 ft Assistive device utilized: None Level of assistance: Modified independence Comments: forward flexed trunk; increased back pain  TODAY'S TREATMENT:  DATE: 10/18/22 Progress note 5 x sit to stand 14.26 sec no UE assist FOTO 57 MMT's see above  Standing: Heel raises on incline x 20 Slant board 5 x 20" Tandem stance with bow and arrow green theraband 2 x 10 each Wall arches 2 x 10 Wall push ups 2 x 10 Doorway stretch 5 x 10" Hip abduction 2# 2 x 10 Hip extension 2# 2 x 10 Sit to stand x 10 no UE assist   Seated: Shoulder horizontal abduction GTB 2 x 10 Thoracic extension over chair x 20   10/16/22 Standing: Heel raises x 10 Slant board 3 x 20" Hip  vectors 2" hold; 2 x 5 each  GTB shoulder rows and ext 2 x 10 each Lumbar extension x 10  Sitting: GTB shoulder abduction 2 x 10 Thoracic extension over chair 2 x 10  Sit to stand 3 x 5 no UE assist    10/09/22:  GTB  shoulder extension 10x  Rows 10x  3D hip excursion (Squat then extension 10x front of chair for mechanics, lateral weight shifting with UE reach, rotation)  Hip flexor stance on 12in step 3x30"  Wall arch 10x    10/02/22: Prone press up 10x 10"  Standing: Squat then extension 10x front of chair for mechanics RTB shoulder extension 10x  Rows 10x   Hip flexor stance on 12in step 3x20"  STS 10x eccentric control  09/28/22:  Sidelying abduction 10x Supine: Bridge 2sets 10x Thomas stretch on edge of mat 2 x 30" towel assistance Prone press up 10x 10" Rest break infront of fan to address fatigue STS 5x Standing lumbar extension  09/26/22: Reviewed goals, discussed benefits with HEP compliance for maximal benefits. 2 sets x 5 reps STS with eccentric control Standing lumbar extension Prone press up 10x 30" holds Glut sets 10x 5" Attempted knee bent pushing foot towards ceiling- too difficult Supine:  Bridge 10x 5" eccentric lowering Marching 10x5 " with ab set Sidelying: clam    09/20/22 physical therapy evaluation and HEP instruction    PATIENT EDUCATION:  Education details: Patient educated on exam findings, POC, scope of PT, HEP, and what to expect next visit. Person educated: Patient Education method: Explanation, Demonstration, and Handouts Education comprehension: verbalized understanding, returned demonstration, verbal cues required, and tactile cues required  HOME EXERCISE PROGRAM: 10/18/22 shoulder habduction with Tband, bow and arrow and sitting thoracic extension Access Code: [email or web address] URL: [email or web address] Date: 09/20/2022 Prepared by: [facility]  Exercises - Static Prone on Elbows  - 2 x daily - 7 x weekly - 1  sets - 1 reps - 3 min hold - Prone Press Up  - 2 x daily - 7 x weekly - 1 sets - 5 reps - Prone Gluteal Sets  - 2 x daily - 7 x weekly - 1 sets - 10 reps - 5" hold - Supine Bridge  - 2 x daily - 7 x weekly - 3 sets - 10 reps  Access Code: [email or web address] URL: [email or web address] Date: 09/28/2022 Prepared by: Becky Sax  Exercises - Static Prone on Elbows  - 2 x daily - 7 x weekly - 1 sets - 1 reps - 3 min hold - Prone Press Up  - 2 x daily - 7 x weekly - 1 sets - 5 reps - Prone Gluteal Sets  - 2 x daily - 7 x weekly - 1 sets - 10 reps - 5" hold - Supine Bridge  - 2 x daily - 7 x  weekly - 3 sets - 10 reps - Supine Lower Trunk Rotation  - 2 x daily - 7 x weekly - 1 sets - 10 reps - Supine Transversus Abdominis Bracing - Hands on Stomach  - 2 x daily - 7 x weekly - 1 sets - 10 reps - Prone Hip Extension  - 1 x daily - 7 x weekly - 1-2 sets - 10 reps- when ready - Clam  - 1 x daily - 7 x weekly - 3 sets - 10 reps - 5" hold -Supine Thomas stretch 3x 30" with towel assistance -ASSESSMENT:  CLINICAL IMPRESSION: Progress note; good improvement with FOTO and tested MMTS; able to perform sit to stand today without using arms to assist.  Progressing well but needs continued skilled therapy services to address remaining unmet and partially met goals. Today's session continued to work on core, postural and lower extremity strength; added bow and arrow with theraband to address balance, postural strengthening.  Updated HEP.  Patient needs frequent cues for tall posturing and continues with forward flexed posture that increases with fatigue but is overall showing good improvement.  Patient will benefit from continued skilled therapy services to address deficits and promote return to optimal function.       Eval: Patient is a 52 y.o. female who was seen today for physical therapy evaluation and treatment for low back pain that radiates down right leg.  Patient  presents to physical therapy  with complaint of back pain and leg and back weakness. Patient demonstrates muscle weakness, reduced ROM, and fascial restrictions which are likely contributing to symptoms of pain and are negatively impacting patient ability to perform ADLs and functional mobility tasks. Patient will benefit from skilled physical therapy services to address these deficits to reduce pain and improve level of function with ADLs and functional mobility tasks.    OBJECTIVE IMPAIRMENTS: Abnormal gait, decreased activity tolerance, decreased mobility, difficulty walking, decreased ROM, decreased strength, hypomobility, increased fascial restrictions, impaired perceived functional ability, postural dysfunction, and pain.   ACTIVITY LIMITATIONS: carrying, lifting, bending, sitting, standing, squatting, sleeping, stairs, transfers, locomotion level, and caring for others  PARTICIPATION LIMITATIONS: meal prep, cleaning, laundry, shopping, community activity, and yard work  PERSONAL FACTORS: 1 comorbidity: MS  are also affecting patient's functional outcome.   REHAB POTENTIAL: Good  CLINICAL DECISION MAKING: Stable/uncomplicated  EVALUATION COMPLEXITY: Low   GOALS: Goals reviewed with patient? No  SHORT TERM GOALS: Target date: 10/04/2022  patient will be independent with initial HEP   Baseline: Goal status: IN PROGRESS  2.  Patient will self report 30% improvement to improve tolerance for functional activity  Baseline:  Goal status: MET    LONG TERM GOALS: Target date: 11/16/2022  Patient will be independent in self management strategies to improve quality of life and functional outcomes.   Baseline:  Goal status: IN PROGRESS  2.  Patient will self report 50% improvement to improve tolerance for functional activity  Baseline:  Goal status: MET  3.  Patient will improve FOTO score to predicted value (61)   Baseline: 52; 57 10/18/22 Goal status: IN PROGRESS  4.  Patient will increase  distance on to 275 ft  to demonstrate improved functional mobility walking household and community distances.   Baseline: 224 ft Goal status: IN PROGRESS  5.  Patient will increase  leg MMTs to 4-5/5 without pain to promote return to ambulation community distances with minimal deviation.  Baseline: see above Goal status: IN PROGRESS  PLAN:  PT FREQUENCY: 2x/week  PT DURATION: 4 weeks  PLANNED INTERVENTIONS: Therapeutic exercises, Therapeutic activity, Neuromuscular re-education, Balance training, Gait training, Patient/Family education, Joint manipulation, Joint mobilization, Stair training, Orthotic/Fit training, DME instructions, Aquatic Therapy, Dry Needling, Electrical stimulation, Spinal manipulation, Spinal mobilization, Cryotherapy, Moist heat, Compression bandaging, scar mobilization, Splintting, Taping, Traction, Ultrasound, Ionotophoresis 4mg /ml Dexamethasone, and Manual therapy .  PLAN FOR NEXT SESSION:  Progress lumbar extension, core and lower extremity strengthening; glutes and postural strengthening. 2 MWT and MMT's for glute and hamstring visit   2:37 PM, 10/18/22 Iona Stay Small Melinda Moore MPT [facility]  [phone]

## 2022-10-22 ENCOUNTER — Encounter: Payer: Self-pay | Admitting: Nurse Practitioner

## 2022-10-22 ENCOUNTER — Other Ambulatory Visit: Payer: Self-pay | Admitting: Internal Medicine

## 2022-10-22 ENCOUNTER — Ambulatory Visit: Payer: 59 | Admitting: Nurse Practitioner

## 2022-10-22 DIAGNOSIS — Z6841 Body Mass Index (BMI) 40.0 and over, adult: Secondary | ICD-10-CM

## 2022-10-22 DIAGNOSIS — K219 Gastro-esophageal reflux disease without esophagitis: Secondary | ICD-10-CM

## 2022-10-22 MED ORDER — PHENTERMINE HCL 37.5 MG PO TABS: 37.5000 mg | ORAL_TABLET | Freq: Every day | ORAL | 0 refills | Status: DC

## 2022-10-22 NOTE — Patient Instructions (Addendum)
Wegovy Zepbound Saxenda 

## 2022-10-22 NOTE — Progress Notes (Signed)
Subjective:    Patient ID: Melinda Moore, female    DOB: [birth year], 52 y.o.   MRN: [id number]  HPI  Patient arrives to discuss weight management options. Patient states she had gastric sleeve in 2017 and was going good but it stalled and is returning. No history of heart issues. Patient is under her husband's insurance which she describes as really good.  Has multiple sclerosis.  Currently in physical therapy.  Had gastric sleeve in 2017 lost about 100 pounds, her weight at that time was low 220s.  Had COVID in 2021 and spent 2 weeks at the hospital and was mainly bedridden with minimal activity, after that she has had a steady weight gain.  Has not done well with her diet due to carbs such as pasta.  Has been trying to eat healthier foods such as fruit granola and yogurt.  Minimal activity other than physical therapy due to her MS and chronic back issues. Denies any personal history of pancreatitis.  Denies any family history of thyroid or endocrine cancers. Took phentermine years ago without difficulty. Review of Systems  Constitutional:  Positive for fatigue.  Respiratory:  Negative for chest tightness, shortness of breath and wheezing.   Cardiovascular:  Negative for chest pain.      10/22/2022    1:51 PM  Depression screen PHQ 2/9  Decreased Interest 0  Down, Depressed, Hopeless 0  PHQ - 2 Score 0  Altered sleeping 1  Tired, decreased energy 0  Change in appetite 0  Feeling bad or failure about yourself  0  Trouble concentrating 0  Moving slowly or fidgety/restless 0  Suicidal thoughts 0  PHQ-9 Score 1      09/20/2022   10:01 AM 08/15/2022   11:11 AM 06/05/2022    5:00 PM 05/16/2022    1:18 PM  GAD 7 : Generalized Anxiety Score  Nervous, Anxious, on Edge 0 1 0 0  Control/stop worrying 0 0 0 0  Worry too much - different things 0 1 0 0  Trouble relaxing 0 0 0 1  Restless 0 0 0 0  Easily annoyed or irritable 0 0 0 0  Afraid - awful might happen 0 0 0 0  Total GAD 7 Score 0  2 0 1  Anxiety Difficulty Not difficult at all  Not difficult at all Not difficult at all         Objective:   Physical Exam NAD.  Alert, oriented.  Calm cheerful affect.  Thyroid nontender to palpation, no mass or goiter noted.  Lungs clear.  Heart regular rate rhythm. Today's Vitals   10/22/22 1350  Weight: 282 lb 12.8 oz (128.3 kg)  Height: 5\' 9"  (1.753 m)   Body mass index is 41.76 kg/m.       Assessment & Plan:   Problem List Items Addressed This Visit       Other   Morbid obesity (HCC) - Primary   Relevant Medications   phentermine (ADIPEX-P) 37.5 MG tablet   Meds ordered this encounter  Medications   phentermine (ADIPEX-P) 37.5 MG tablet    Sig: Take 1 tablet (37.5 mg total) by mouth daily before breakfast.    Dispense:  30 tablet    Refill:  0    Order Specific Question:   Supervising Provider    Answer:   Lilyan Punt A [[id number]]   Trial of phentermine as directed.  Again reviewed potential adverse effects. Patient will check with her insurance to see  if any of the GLP-1 injectables are covered and get back with our office. Return in about 1 month (around 11/22/2022). Call back sooner if any problems.

## 2022-10-23 ENCOUNTER — Encounter (HOSPITAL_COMMUNITY): Payer: 59

## 2022-10-25 ENCOUNTER — Encounter (HOSPITAL_COMMUNITY): Payer: 59

## 2022-10-26 ENCOUNTER — Encounter (HOSPITAL_COMMUNITY): Payer: 59

## 2022-11-07 ENCOUNTER — Ambulatory Visit (INDEPENDENT_AMBULATORY_CARE_PROVIDER_SITE_OTHER): Payer: 59 | Admitting: Family Medicine

## 2022-11-07 VITALS — BP 104/64 | HR 102 | Wt 282.0 lb

## 2022-11-07 DIAGNOSIS — Z79899 Other long term (current) drug therapy: Secondary | ICD-10-CM | POA: Diagnosis not present

## 2022-11-07 DIAGNOSIS — M5431 Sciatica, right side: Secondary | ICD-10-CM

## 2022-11-07 DIAGNOSIS — G35 Multiple sclerosis: Secondary | ICD-10-CM | POA: Diagnosis not present

## 2022-11-07 DIAGNOSIS — E785 Hyperlipidemia, unspecified: Secondary | ICD-10-CM

## 2022-11-07 DIAGNOSIS — Z79891 Long term (current) use of opiate analgesic: Secondary | ICD-10-CM | POA: Diagnosis not present

## 2022-11-07 DIAGNOSIS — G894 Chronic pain syndrome: Secondary | ICD-10-CM

## 2022-11-07 MED ORDER — HYDROMORPHONE HCL 4 MG PO TABS: ORAL_TABLET | ORAL | 0 refills | Status: DC

## 2022-11-07 MED ORDER — SERTRALINE HCL 50 MG PO TABS: 25.0000 mg | ORAL_TABLET | Freq: Two times a day (BID) | ORAL | 5 refills | Status: DC

## 2022-11-07 MED ORDER — TORSEMIDE 20 MG PO TABS: ORAL_TABLET | ORAL | 5 refills | Status: DC

## 2022-11-07 MED ORDER — POTASSIUM CHLORIDE CRYS ER 20 MEQ PO TBCR: EXTENDED_RELEASE_TABLET | ORAL | 6 refills | Status: DC

## 2022-11-07 MED ORDER — CARBAMAZEPINE ER 200 MG PO TB12: 200.0000 mg | ORAL_TABLET | Freq: Three times a day (TID) | ORAL | 1 refills | Status: DC | PRN

## 2022-11-07 NOTE — Progress Notes (Signed)
   Subjective:    Patient ID: Melinda Moore, female    DOB: [birth year], 52 y.o.   MRN: [id number]  HPI

## 2022-11-07 NOTE — Progress Notes (Signed)
Subjective:    Patient ID: Melinda Moore, female    DOB: 1971-02-04, 52 y.o.   MRN: [id number]  HPI This patient was seen today for chronic pain  The medication list was reviewed and updated.   Location of Pain for which the patient has been treated with regarding narcotics: Patient complains of pain in the arms and legs as well as her back She also complains of sciatica down the right leg.  Onset of this pain: Some of the pain has been due to MS and then present for years other pain with the sciatica been present over the past several years   -Compliance with medication: Good compliance with medicine  - Number patient states they take daily: Maximum 4 times per day  -Reason for ongoing use of opioids patient has tried and failed NSAIDs Tylenol physical therapy injections as well as stretching and gentle exercise only pain relief she has been able to get is with Dilaudid she has been on this for years through specialist and more recently through [place].  She does do a yearly urine drug screen, every 3 months visit, pain management contract  What other measures have been tried outside of opioids Tylenol NSAIDs stretching physical therapy  In the ongoing specialists regarding this condition sees back specialist  -when was the last dose patient took?  Earlier today  The patient was advised the importance of maintaining medication and not using illegal substances with these.  Here for refills and follow up  The patient was educated that we can provide 3 monthly scripts for their medication, it is their responsibility to follow the instructions.  Side effects or complications from medications: Denies side effects  Patient is aware that pain medications are meant to minimize the severity of the pain to allow their pain levels to improve to allow for better function. They are aware of that pain medications cannot totally remove their pain.  Due for UDT ( at least once per year) (pain  management contract is also completed at the time of the UDT): September 2023  Scale of 1 to 10 ( 1 is least 10 is most) Your pain level without the medicine: 8 Your pain level with medication 4  Scale 1 to 10 ( 1-helps very little, 10 helps very well) How well does your pain medication reduce your pain so you can function better through out the day? 8  Quality of the pain: Aching  Persistence of the pain: Present all the time  Modifying factors: Worse with activity  Outpatient Encounter Medications as of 11/07/2022  Medication Sig   ascorbic acid (VITAMIN C) 500 MG tablet Take 1 tablet (500 mg total) by mouth daily.   baclofen (LIORESAL) 10 MG tablet Take 10 mg by mouth 2 (two) times daily.   Bioflavonoid Products (BIOFLEX) TABS Take 2 tablets by mouth daily.   buPROPion (WELLBUTRIN SR) 150 MG 12 hr tablet TAKE ONE TABLET BY MOUTH TWICE A DAY   Cholecalciferol (VITAMIN D3) 125 MCG (5000 UT) TABS Take 20,000 Units by mouth 2 (two) times daily.   gabapentin (NEURONTIN) 300 MG capsule Take 1,200 mg by mouth 3 (three) times daily.   HYDROmorphone (DILAUDID) 4 MG tablet 1 qid prn pain caution drowsiness   linaclotide (LINZESS) 145 MCG CAPS capsule Take 1 capsule (145 mcg total) by mouth daily before breakfast.   Multiple Vitamin (MULTIVITAMIN WITH MINERALS) TABS tablet Take 1 tablet by mouth daily. ALIVE   Multiple Vitamins-Minerals (EMERGEN-C IMMUNE) PACK Take  1 packet by mouth once a week.   Naldemedine Tosylate (SYMPROIC) 0.2 MG TABS TAKE ONE TABLET (0.2MG ) BY MOUTH DAILY   naloxone (NARCAN) 4 MG/0.1ML LIQD nasal spray kit SPRAY INTO NOSE AS DIRECTED FOR ACCIDENTAL OVERDOSE   Ofatumumab (KESIMPTA) 20 MG/0.4ML SOAJ Inject into the skin.   ondansetron (ZOFRAN) 8 MG tablet Take 1 tablet (8 mg total) by mouth 2 (two) times daily as needed for nausea or vomiting.   pantoprazole (PROTONIX) 40 MG tablet TAKE ONE TABLET BY MOUTH TWICE A DAY BEFORE A MEAL   phentermine (ADIPEX-P) 37.5 MG tablet  Take 1 tablet (37.5 mg total) by mouth daily before breakfast.   zinc sulfate 220 (50 Zn) MG capsule Take 1 capsule (220 mg total) by mouth daily.   [DISCONTINUED] carbamazepine (TEGRETOL XR) 200 MG 12 hr tablet Take 1 tablet (200 mg total) by mouth 3 (three) times daily as needed.   [DISCONTINUED] HYDROmorphone (DILAUDID) 4 MG tablet 1 tablet po qid prn, max use per day is 4 tablets   [DISCONTINUED] HYDROmorphone (DILAUDID) 4 MG tablet 1 tablet po qid prn, max use per day is 4 tablets.   [DISCONTINUED] potassium chloride SA (KLOR-CON M) 20 MEQ tablet 1 qd   [DISCONTINUED] predniSONE (DELTASONE) 20 MG tablet 3 daily for 2 days, 2 daily for 2 days, 1 daily for 2 days   [DISCONTINUED] sertraline (ZOLOFT) 50 MG tablet TAKE ONE-HALF TABLET BY MOUTH TWICE A DAY   [DISCONTINUED] torsemide (DEMADEX) 20 MG tablet Take 1/2 to 1  tablet po q am prn pedal edema   carbamazepine (TEGRETOL XR) 200 MG 12 hr tablet Take 1 tablet (200 mg total) by mouth 3 (three) times daily as needed.   HYDROmorphone (DILAUDID) 4 MG tablet 1 tablet po qid prn, max use per day is 4 tablets.   HYDROmorphone (DILAUDID) 4 MG tablet 1 tablet po qid prn, max use per day is 4 tablets   potassium chloride SA (KLOR-CON M) 20 MEQ tablet 1 qd   sertraline (ZOLOFT) 50 MG tablet Take 0.5 tablets (25 mg total) by mouth 2 (two) times daily.   torsemide (DEMADEX) 20 MG tablet Take 1/2 to 1  tablet po q am prn pedal edema   No facility-administered encounter medications on file as of 11/07/2022.   Sciatica, right side  MS (multiple sclerosis) (HCC) - Plan: carbamazepine (TEGRETOL XR) 200 MG 12 hr tablet  Encounter for long-term opiate analgesic use  Morbid obesity (HCC)  High risk medication use - Plan: Basic Metabolic Panel, CBC with Differential, Hepatic Function Panel  Hyperlipidemia, unspecified hyperlipidemia type - Plan: Lipid panel  Chronic pain syndrome  Has history of high cholesterol will recheck again may need to be on  medication depending on results Uses Tegretol intermittently for sharp pains in her face Pedal edema intermittently uses torsemide on recommend she is using sporadically no more than maybe a couple times per week to use potassium with     Review of Systems     Objective:   Physical Exam General-in no acute distress Eyes-no discharge Lungs-respiratory rate normal, CTA CV-no murmurs,RRR Extremities skin warm dry no edema Neuro grossly normal Behavior normal, alert        Assessment & Plan:  1. MS (multiple sclerosis) (HCC) Patient with MS she is followed by specialist on a regular basis occasionally gets nerve related pain in the face that is sore like a trigeminal neuralgia refill of Tegretol given we will check CBC - carbamazepine (TEGRETOL XR) 200 MG  12 hr tablet; Take 1 tablet (200 mg total) by mouth 3 (three) times daily as needed.  Dispense: 90 tablet; Refill: 1  2. Sciatica, right side Does have sciatica down the right side worse on certain days gabapentin helps to some degree also patient states that does not cause any significant drowsiness and states she utilizes gabapentin 2 in the morning, 2 middle of the day, 4 in the evening  She does take her Dilaudid pain medicine 4 times per day this is for the sciatica as well as MS related pain she has been on this for years was under the care of a specialist the specialist mood and we took over the medication here she does do yearly pain management contract urine drug screen as well as every 8-month visit PDMP checked  3. Encounter for long-term opiate analgesic use Please see discussion above  The patient was seen in followup for chronic pain. A review over at their current pain status was discussed. Drug registry was checked. Prescriptions were given.  Regular follow-up recommended. Discussion was held regarding the importance of compliance with medication as well as pain medication contract.  Patient was informed that  medication may cause drowsiness and should not be combined  with other medications/alcohol or street drugs. If the patient feels medication is causing altered alertness then do not drive or operate dangerous equipment.  Should be noted that the patient appears to be meeting appropriate use of opioids and response.  Evidenced by improved function and decent pain control without significant side effects and no evidence of overt aberrancy issues.  Upon discussion with the patient today they understand that opioid therapy is optional and they feel that the pain has been refractory to reasonable conservative measures and is significant and affecting quality of life enough to warrant ongoing therapy and wishes to continue opioids.  Refills were provided.   4. Morbid obesity (HCC) Portion control regular physical activity  5. High risk medication use Check lab work - Basic Metabolic Panel - CBC with Differential - Hepatic Function Panel  6. Hyperlipidemia, unspecified hyperlipidemia type Healthy diet check labs - Lipid panel  Follow-up 3 months

## 2022-11-08 ENCOUNTER — Encounter (HOSPITAL_COMMUNITY): Payer: 59

## 2022-11-13 ENCOUNTER — Ambulatory Visit (HOSPITAL_COMMUNITY): Payer: 59 | Attending: Family Medicine | Admitting: Physical Therapy

## 2022-11-13 DIAGNOSIS — G35 Multiple sclerosis: Secondary | ICD-10-CM | POA: Insufficient documentation

## 2022-11-13 DIAGNOSIS — M545 Low back pain, unspecified: Secondary | ICD-10-CM | POA: Insufficient documentation

## 2022-11-13 DIAGNOSIS — R2689 Other abnormalities of gait and mobility: Secondary | ICD-10-CM | POA: Diagnosis not present

## 2022-11-13 DIAGNOSIS — M6281 Muscle weakness (generalized): Secondary | ICD-10-CM | POA: Diagnosis not present

## 2022-11-13 DIAGNOSIS — R262 Difficulty in walking, not elsewhere classified: Secondary | ICD-10-CM | POA: Diagnosis not present

## 2022-11-13 NOTE — Therapy (Signed)
OUTPATIENT PHYSICAL THERAPY THORACOLUMBAR TREATMENT    Patient Name: Melinda Moore MRN: [id number] DOB:[birth year], 52 y.o., female Today's Date: 11/13/2022  END OF SESSION:  PT End of Session - 11/13/22 1532     Visit Number 8    Number of Visits 8    Date for PT Re-Evaluation 11/16/22    Authorization Type [employer]    Authorization - Number of Visits 60    PT Start Time 1433    PT Stop Time 1515    PT Time Calculation (min) 42 min    Activity Tolerance Patient limited by fatigue;Patient tolerated treatment well    Behavior During Therapy [facility] for tasks assessed/performed              Past Medical History:  Diagnosis Date   Anemia    Anxiety    Bell's palsy    Depression    DVT (deep venous thrombosis) (HCC) 09/02/2012   [place] on 02/13/2012 showed acute DVT in left calf veins and posterior tibial veins   Fibromyalgia    GERD (gastroesophageal reflux disease)    History of blood transfusion 2000   History of trigeminal neuralgia    HTN (hypertension)    patient denies was taking a blood pressure medication in early 2000 but it was migraines   Leukopenia    MS (multiple sclerosis) (HCC) 1997   Neuropathic pain    Peripheral edema    Port catheter in place 12/31/2012   Right bundle branch block    Past Surgical History:  Procedure Laterality Date   ABDOMINAL HYSTERECTOMY     CHOLECYSTECTOMY     COLONOSCOPY WITH PROPOFOL N/A 09/27/2021   Procedure: COLONOSCOPY WITH PROPOFOL;  Surgeon: Corbin Ade, MD;  Location: [facility];  Service: Endoscopy;  Laterality: N/A;  8:00am, asa 2   ESOPHAGOGASTRODUODENOSCOPY  2011   Dr. Jena Gauss. 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 Biopsy from the stomach revealed minimal chronic inactive inflammation but negative for H. pylori   FLEXIBLE SIGMOIDOSCOPY N/A 02/08/2022   Procedure: FLEXIBLE SIGMOIDOSCOPY;  Surgeon: Corbin Ade, MD;  Location:  [facility];  Service: Endoscopy;  Laterality: N/A;   LAPAROSCOPIC GASTRIC SLEEVE RESECTION N/A 04/17/2016   Procedure: LAPAROSCOPIC GASTRIC SLEEVE RESECTION WITH UPPER ENDOSCOPY;  Surgeon: Glenna Fellows, MD;  Location: [facility];  Service: General;  Laterality: N/A;   PATELLA-FEMORAL ARTHROPLASTY Right 07/14/2019   Procedure: PATELLA-FEMORAL ARTHROPLASTY;  Surgeon: Teryl Lucy, MD;  Location: [facility];  Service: Orthopedics;  Laterality: Right;   PORTACATH PLACEMENT     TUBAL LIGATION  2001   Patient Active Problem List   Diagnosis Date Noted   Chronic low back pain (1ry area of Pain) (Bilateral) (R>L) w/o sciatica 07/18/2022   Lumbar facet arthropathy (Bilateral) 07/18/2022   Lumbar facet joint pain 07/18/2022   Chronic lower extremity pain (2ry area of Pain) (Right) 07/18/2022   DDD (degenerative disc disease), lumbosacral 07/18/2022   Abnormal MRI, lumbar spine (10/11/2017) 07/18/2022   Lumbosacral foraminal stenosis (Left: L5-S1) 07/18/2022   Obesity, Class II, BMI 35-39.9 07/18/2022   Osteoarthritis of facet joint of lumbar spine 07/18/2022   Primary osteoarthritis of lumbar spine 07/18/2022   History of total knee replacement (Right) 07/18/2022   Chronic knee pain s/p TKR (Right) 07/18/2022   Chronic medial knee pain (Right) 07/18/2022   Decreased range of motion of lumbar spine 07/18/2022   Pharmacologic therapy 07/15/2022   Disorder of skeletal  system 07/15/2022   Problems influencing health status 07/15/2022   Gastroesophageal reflux disease 09/07/2021   Colon cancer screening 09/07/2021   Hx of hysterectomy 06/14/2021   Elevated transaminase level 02/04/2020   Patellofemoral arthritis of knee (Right) 07/14/2019   History of DVT in adulthood 06/23/2019   Gait abnormality 03/12/2017   Radiculopathy due to lumbar intervertebral disc disorder 09/21/2016   Spondylosis without myelopathy or radiculopathy, lumbar region 09/21/2016   History of anxiety 09/08/2016   Primary  osteoarthritis of knee (Right) 08/30/2016   Primary insomnia 08/30/2016   Myofascial pain 08/30/2016   Sciatica, right side 12/12/2015   Morbid obesity (HCC) 08/12/2015   Chronic anxiety 07/08/2013   Swelling of breast 04/08/2013   Chronic pain syndrome 03/11/2013   Port catheter in place 12/31/2012   Constipation 10/01/2012   Anemia 10/01/2012   Rectal bleeding 10/01/2012   Elevated alkaline phosphatase level 10/01/2012   MS (multiple sclerosis) (HCC) 09/02/2012   Anxiety 11/29/2011   Headache(784.0) 11/29/2011   ANEMIA 04/12/2010   Other dysphagia 04/12/2010    PCP: Lilyan Punt, MD  REFERRING PROVIDER: Babs Sciara, MD  REFERRING DIAG: M54.9 (ICD-10-CM) - Back pain, unspecified back location, unspecified back pain laterality, unspecified chronicity  Rationale for Evaluation and Treatment: Rehabilitation  THERAPY DIAG:  Low back pain, unspecified back pain laterality, unspecified chronicity, unspecified whether sciatica present  Difficulty in walking, not elsewhere classified  Muscle weakness (generalized)  Other abnormalities of gait and mobility  Multiple sclerosis (HCC)  ONSET DATE: worse since 2017   SUBJECTIVE:                                                                                                                                                                                           SUBJECTIVE STATEMENT: Pt returns after 3 week absence.  Reports pain of 3/10 currently in lower back.  States she's been trying to keep up with her exercises.  Eval: Diagnosis with MS years ago; sees neurologist in [facility] ; has generally done well but in 2017 started having more issues; after TKA and COVD " I just haven't bounced back"; reports stiffness, back pain and leg pain; MS has affected Right side more.  Pt accompanied by: self   PERTINENT HISTORY:   seeing pain management at [facility], Dr. Jola Schmidt for injections LBP;  TKA 2021 R ; then COVD  shortly at the end of 2021  x 2 week in the hospital Also had some injections in the back per Dr. Murray Hodgkins that did help some   PAIN:  Are you having pain? Yes: NPRS scale: 7/10 Pain location: Right leg Pain  description: constant throbbing Aggravating factors: lots of activity Relieving factors: medication   PAIN:  Are you having pain? Yes: NPRS scale: 6/10 Pain location: low back and right leg Pain description: throbbing and nagging aching; constant Aggravating factors: activity Relieving factors: rest, injections  PRECAUTIONS: Fall  WEIGHT BEARING RESTRICTIONS: No  FALLS:  Has patient fallen in last 6 months? No  OCCUPATION: not working  PLOF: Independent with basic ADLs  PATIENT GOALS: get off some of these pain meds  NEXT MD VISIT: 10/04/22 Dr. Hetty Ely; end of May sees Dr. Gerda Diss  OBJECTIVE:   DIAGNOSTIC FINDINGS:  CLINICAL DATA:  Low back pain. Chronic bilateral low back pain without sciatica. Lumbar facet arthropathy. Lumbar facet joint pain. Degenerative disc disease.   EXAM: LUMBAR SPINE - COMPLETE WITH BENDING VIEWS   COMPARISON:  Lumbar MRI 10/11/2017   FINDINGS: AP, bilateral oblique, lateral, lateral flexion, lateral extension and lateral L5-S1 spot views obtained. 5 non-rib-bearing lumbar vertebra with tiny ribs at L1 as before.   There is 4-5 mm of anterolisthesis of L4 on L5 present on flexion and extension, this is 0 mm on neutral imaging. Alignment is otherwise normal. Vertebral body heights are normal. There is mild L3-L4 and L4-L5 disc space narrowing and spurring. Mild L3-L4, moderate at L4-L5 and mild L5-S1 facet hypertrophy. No evidence of fracture focal bone abnormalities. The sacroiliac joints are congruent.   IMPRESSION: 1. Degenerative disc disease and facet arthropathy at L3-L4 and L4-L5. 2. Grade 1 anterolisthesis of L4 on L5 measuring 4-5 mm on flexion and extension, 0 mm on neutral imaging.     Electronically Signed   By:  Narda Rutherford M.D.   On: 07/20/2022 15:18    PATIENT SURVEYS:  FOTO 52  COGNITION: Overall cognitive status: Within functional limits for tasks assessed     SENSATION: Neuropathy both fee  POSTURE: rounded shoulders, forward head, and flexed trunk   PALPATION:   LUMBAR ROM:   AROM eval  Flexion 60% available  Extension Unable to get to neutral  Right lateral flexion   Left lateral flexion   Right rotation   Left rotation    (Blank rows = not tested)   LOWER EXTREMITY MMT:    MMT Right eval Left eval Right 10/18/22 Left 10/18/22  Hip flexion 4 4 4+ 4+  Hip extension 2 2    Hip abduction      Hip adduction      Hip internal rotation      Hip external rotation      Knee flexion 3- 3-    Knee extension 4 4 4 5   Ankle dorsiflexion 4+ 4+ 5 5  Ankle plantarflexion      Ankle inversion      Ankle eversion       (Blank rows = not tested)   FUNCTIONAL TESTS:  5 times sit to stand: 18.18 sec using arm to push up on thighs.  2 minute walk test: 224 ft (had to stop before time was up; about 36 sec less)  GAIT: Distance walked: 224 ft Assistive device utilized: None Level of assistance: Modified independence Comments: forward flexed trunk; increased back pain  TODAY'S TREATMENT:  DATE: 11/13/22 Standing: Heel raises on incline x 20 Slant board 5 x 20" Tandem stance with bow and arrow green theraband 2 x 10 each Hip abduction 2# 2 x 10 Hip extension 2# 2 x 10 Marching alternating 2# 2X10 Wall arches 2 x 10 Wall push ups 2 x 10 UE flexion against wall 2X10 Sit to stand x 10 no UE assist  10/18/22 Progress note 5 x sit to stand 14.26 sec no UE assist FOTO 57 MMT's see above  Standing: Heel raises on incline x 20 Slant board 5 x 20" Tandem stance with bow and arrow green theraband 2 x 10 each Wall arches 2 x 10 Wall push ups 2 x  10 Doorway stretch 5 x 10" Hip abduction 2# 2 x 10 Hip extension 2# 2 x 10 Sit to stand x 10 no UE assist Seated: Shoulder horizontal abduction GTB 2 x 10 Thoracic extension over chair x 20  10/16/22 Standing: Heel raises x 10 Slant board 3 x 20" Hip vectors 2" hold; 2 x 5 each  GTB shoulder rows and ext 2 x 10 each Lumbar extension x 10  Sitting: GTB shoulder abduction 2 x 10 Thoracic extension over chair 2 x 10  Sit to stand 3 x 5 no UE assist    10/09/22:  GTB  shoulder extension 10x  Rows 10x  3D hip excursion (Squat then extension 10x front of chair for mechanics, lateral weight shifting with UE reach, rotation)  Hip flexor stance on 12in step 3x30"  Wall arch 10x    10/02/22: Prone press up 10x 10"  Standing: Squat then extension 10x front of chair for mechanics RTB shoulder extension 10x  Rows 10x   Hip flexor stance on 12in step 3x20"  STS 10x eccentric control  09/28/22:  Sidelying abduction 10x Supine: Bridge 2sets 10x Thomas stretch on edge of mat 2 x 30" towel assistance Prone press up 10x 10" Rest break infront of fan to address fatigue STS 5x Standing lumbar extension  09/26/22: Reviewed goals, discussed benefits with HEP compliance for maximal benefits. 2 sets x 5 reps STS with eccentric control Standing lumbar extension Prone press up 10x 30" holds Glut sets 10x 5" Attempted knee bent pushing foot towards ceiling- too difficult Supine:  Bridge 10x 5" eccentric lowering Marching 10x5 " with ab set Sidelying: clam    09/20/22 physical therapy evaluation and HEP instruction    PATIENT EDUCATION:  Education details: Patient educated on exam findings, POC, scope of PT, HEP, and what to expect next visit. Person educated: Patient Education method: Explanation, Demonstration, and Handouts Education comprehension: verbalized understanding, returned demonstration, verbal cues required, and tactile cues required  HOME EXERCISE  PROGRAM: 10/18/22 shoulder habduction with Tband, bow and arrow and sitting thoracic extension Access Code: [email or web address] URL: [email or web address] Date: 09/20/2022 Prepared by: [facility]  Exercises - Static Prone on Elbows  - 2 x daily - 7 x weekly - 1 sets - 1 reps - 3 min hold - Prone Press Up  - 2 x daily - 7 x weekly - 1 sets - 5 reps - Prone Gluteal Sets  - 2 x daily - 7 x weekly - 1 sets - 10 reps - 5" hold - Supine Bridge  - 2 x daily - 7 x weekly - 3 sets - 10 reps  Access Code: [email or web address] URL: [email or web address] Date: 09/28/2022 Prepared by: Becky Sax  Exercises - Static Prone on Elbows  - 2 x daily - 7 x  weekly - 1 sets - 1 reps - 3 min hold - Prone Press Up  - 2 x daily - 7 x weekly - 1 sets - 5 reps - Prone Gluteal Sets  - 2 x daily - 7 x weekly - 1 sets - 10 reps - 5" hold - Supine Bridge  - 2 x daily - 7 x weekly - 3 sets - 10 reps - Supine Lower Trunk Rotation  - 2 x daily - 7 x weekly - 1 sets - 10 reps - Supine Transversus Abdominis Bracing - Hands on Stomach  - 2 x daily - 7 x weekly - 1 sets - 10 reps - Prone Hip Extension  - 1 x daily - 7 x weekly - 1-2 sets - 10 reps- when ready - Clam  - 1 x daily - 7 x weekly - 3 sets - 10 reps - 5" hold -Supine Thomas stretch 3x 30" with towel assistance -ASSESSMENT:  CLINICAL IMPRESSION: Resumed therex this session.  Continued with LE and postural strengthening exercises as well as balance challenge.  Pt required frequent rest breaks during session today due to fatigue.  Frequent cues for tall posturing during therex and general mobility with standing. Explained she had one appt remaining under this certification period and would check for further skilled need next visit.  Encouraged to continue with established HEP.  Patient will benefit from continued skilled therapy services to address deficits and promote return to optimal function.     Eval: Patient is a 52 y.o. female who was seen today  for physical therapy evaluation and treatment for low back pain that radiates down right leg.  Patient  presents to physical therapy with complaint of back pain and leg and back weakness. Patient demonstrates muscle weakness, reduced ROM, and fascial restrictions which are likely contributing to symptoms of pain and are negatively impacting patient ability to perform ADLs and functional mobility tasks. Patient will benefit from skilled physical therapy services to address these deficits to reduce pain and improve level of function with ADLs and functional mobility tasks.    OBJECTIVE IMPAIRMENTS: Abnormal gait, decreased activity tolerance, decreased mobility, difficulty walking, decreased ROM, decreased strength, hypomobility, increased fascial restrictions, impaired perceived functional ability, postural dysfunction, and pain.   ACTIVITY LIMITATIONS: carrying, lifting, bending, sitting, standing, squatting, sleeping, stairs, transfers, locomotion level, and caring for others  PARTICIPATION LIMITATIONS: meal prep, cleaning, laundry, shopping, community activity, and yard work  PERSONAL FACTORS: 1 comorbidity: MS  are also affecting patient's functional outcome.   REHAB POTENTIAL: Good  CLINICAL DECISION MAKING: Stable/uncomplicated  EVALUATION COMPLEXITY: Low   GOALS: Goals reviewed with patient? No  SHORT TERM GOALS: Target date: 10/04/2022  patient will be independent with initial HEP   Baseline: Goal status: IN PROGRESS  2.  Patient will self report 30% improvement to improve tolerance for functional activity  Baseline:  Goal status: MET    LONG TERM GOALS: Target date: 11/16/2022  Patient will be independent in self management strategies to improve quality of life and functional outcomes.   Baseline:  Goal status: IN PROGRESS  2.  Patient will self report 50% improvement to improve tolerance for functional activity  Baseline:  Goal status: MET  3.  Patient will  improve FOTO score to predicted value (61)   Baseline: 52; 57 10/18/22 Goal status: IN PROGRESS  4.  Patient will increase distance on to 275 ft  to demonstrate improved functional mobility walking household and community distances.  Baseline: 224 ft Goal status: IN PROGRESS  5.  Patient will increase  leg MMTs to 4-5/5 without pain to promote return to ambulation community distances with minimal deviation.  Baseline: see above Goal status: IN PROGRESS  PLAN:  PT FREQUENCY: 2x/week  PT DURATION: 4 weeks  PLANNED INTERVENTIONS: Therapeutic exercises, Therapeutic activity, Neuromuscular re-education, Balance training, Gait training, Patient/Family education, Joint manipulation, Joint mobilization, Stair training, Orthotic/Fit training, DME instructions, Aquatic Therapy, Dry Needling, Electrical stimulation, Spinal manipulation, Spinal mobilization, Cryotherapy, Moist heat, Compression bandaging, scar mobilization, Splintting, Taping, Traction, Ultrasound, Ionotophoresis 4mg /ml Dexamethasone, and Manual therapy .  PLAN FOR NEXT SESSION:   Progress note next session.   3:33 PM, 11/13/22 Lurena Nida, PTA/CLT [facility] Ph: [phone]

## 2022-11-15 ENCOUNTER — Ambulatory Visit: Payer: 59 | Admitting: Family Medicine

## 2022-11-16 ENCOUNTER — Ambulatory Visit (HOSPITAL_COMMUNITY): Payer: 59

## 2022-11-16 DIAGNOSIS — M6281 Muscle weakness (generalized): Secondary | ICD-10-CM | POA: Diagnosis not present

## 2022-11-16 DIAGNOSIS — R262 Difficulty in walking, not elsewhere classified: Secondary | ICD-10-CM

## 2022-11-16 DIAGNOSIS — R2689 Other abnormalities of gait and mobility: Secondary | ICD-10-CM | POA: Diagnosis not present

## 2022-11-16 DIAGNOSIS — M545 Low back pain, unspecified: Secondary | ICD-10-CM | POA: Diagnosis not present

## 2022-11-16 DIAGNOSIS — G35 Multiple sclerosis: Secondary | ICD-10-CM | POA: Diagnosis not present

## 2022-11-16 NOTE — Therapy (Signed)
OUTPATIENT PHYSICAL THERAPY THORACOLUMBAR TREATMENT    Patient Name: Melinda Moore MRN: [id number] DOB:[birth year], 52 y.o., female Today's Date: 11/16/2022  END OF SESSION:  PT End of Session - 11/16/22 1437     Visit Number 9    Number of Visits 16    Date for PT Re-Evaluation 11/16/22    Authorization Type [employer]    Authorization - Number of Visits 60    PT Start Time 1437    PT Stop Time 1515    PT Time Calculation (min) 38 min    Activity Tolerance Patient limited by fatigue;Patient tolerated treatment well    Behavior During Therapy [facility] for tasks assessed/performed              Past Medical History:  Diagnosis Date   Anemia    Anxiety    Bell's palsy    Depression    DVT (deep venous thrombosis) (HCC) 09/02/2012   [place] on 02/13/2012 showed acute DVT in left calf veins and posterior tibial veins   Fibromyalgia    GERD (gastroesophageal reflux disease)    History of blood transfusion 2000   History of trigeminal neuralgia    HTN (hypertension)    patient denies was taking a blood pressure medication in early 2000 but it was migraines   Leukopenia    MS (multiple sclerosis) (HCC) 1997   Neuropathic pain    Peripheral edema    Port catheter in place 12/31/2012   Right bundle branch block    Past Surgical History:  Procedure Laterality Date   ABDOMINAL HYSTERECTOMY     CHOLECYSTECTOMY     COLONOSCOPY WITH PROPOFOL N/A 09/27/2021   Procedure: COLONOSCOPY WITH PROPOFOL;  Surgeon: Corbin Ade, MD;  Location: [facility];  Service: Endoscopy;  Laterality: N/A;  8:00am, asa 2   ESOPHAGOGASTRODUODENOSCOPY  2011   Dr. Jena Gauss. 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 Biopsy from the stomach revealed minimal chronic inactive inflammation but negative for H. pylori   FLEXIBLE SIGMOIDOSCOPY N/A 02/08/2022   Procedure: FLEXIBLE SIGMOIDOSCOPY;  Surgeon: Corbin Ade, MD;  Location:  [facility];  Service: Endoscopy;  Laterality: N/A;   LAPAROSCOPIC GASTRIC SLEEVE RESECTION N/A 04/17/2016   Procedure: LAPAROSCOPIC GASTRIC SLEEVE RESECTION WITH UPPER ENDOSCOPY;  Surgeon: Glenna Fellows, MD;  Location: [facility];  Service: General;  Laterality: N/A;   PATELLA-FEMORAL ARTHROPLASTY Right 07/14/2019   Procedure: PATELLA-FEMORAL ARTHROPLASTY;  Surgeon: Teryl Lucy, MD;  Location: [facility];  Service: Orthopedics;  Laterality: Right;   PORTACATH PLACEMENT     TUBAL LIGATION  2001   Patient Active Problem List   Diagnosis Date Noted   Chronic low back pain (1ry area of Pain) (Bilateral) (R>L) w/o sciatica 07/18/2022   Lumbar facet arthropathy (Bilateral) 07/18/2022   Lumbar facet joint pain 07/18/2022   Chronic lower extremity pain (2ry area of Pain) (Right) 07/18/2022   DDD (degenerative disc disease), lumbosacral 07/18/2022   Abnormal MRI, lumbar spine (10/11/2017) 07/18/2022   Lumbosacral foraminal stenosis (Left: L5-S1) 07/18/2022   Obesity, Class II, BMI 35-39.9 07/18/2022   Osteoarthritis of facet joint of lumbar spine 07/18/2022   Primary osteoarthritis of lumbar spine 07/18/2022   History of total knee replacement (Right) 07/18/2022   Chronic knee pain s/p TKR (Right) 07/18/2022   Chronic medial knee pain (Right) 07/18/2022   Decreased range of motion of lumbar spine 07/18/2022   Pharmacologic therapy 07/15/2022   Disorder of skeletal  system 07/15/2022   Problems influencing health status 07/15/2022   Gastroesophageal reflux disease 09/07/2021   Colon cancer screening 09/07/2021   Hx of hysterectomy 06/14/2021   Elevated transaminase level 02/04/2020   Patellofemoral arthritis of knee (Right) 07/14/2019   History of DVT in adulthood 06/23/2019   Gait abnormality 03/12/2017   Radiculopathy due to lumbar intervertebral disc disorder 09/21/2016   Spondylosis without myelopathy or radiculopathy, lumbar region 09/21/2016   History of anxiety 09/08/2016   Primary  osteoarthritis of knee (Right) 08/30/2016   Primary insomnia 08/30/2016   Myofascial pain 08/30/2016   Sciatica, right side 12/12/2015   Morbid obesity (HCC) 08/12/2015   Chronic anxiety 07/08/2013   Swelling of breast 04/08/2013   Chronic pain syndrome 03/11/2013   Port catheter in place 12/31/2012   Constipation 10/01/2012   Anemia 10/01/2012   Rectal bleeding 10/01/2012   Elevated alkaline phosphatase level 10/01/2012   MS (multiple sclerosis) (HCC) 09/02/2012   Anxiety 11/29/2011   Headache(784.0) 11/29/2011   ANEMIA 04/12/2010   Other dysphagia 04/12/2010    PCP: Lilyan Punt, MD  REFERRING PROVIDER: Babs Sciara, MD  REFERRING DIAG: M54.9 (ICD-10-CM) - Back pain, unspecified back location, unspecified back pain laterality, unspecified chronicity  Rationale for Evaluation and Treatment: Rehabilitation  THERAPY DIAG:  Low back pain, unspecified back pain laterality, unspecified chronicity, unspecified whether sciatica present - Plan: PT plan of care cert/re-cert  Difficulty in walking, not elsewhere classified - Plan: PT plan of care cert/re-cert  Muscle weakness (generalized) - Plan: PT plan of care cert/re-cert  ONSET DATE: worse since 2017   SUBJECTIVE:                                                                                                                                                                                           SUBJECTIVE STATEMENT: " About 70%" better overall; still feel stiff  Eval: Diagnosis with MS years ago; sees neurologist in [facility] ; has generally done well but in 2017 started having more issues; after TKA and COVD " I just haven't bounced back"; reports stiffness, back pain and leg pain; MS has affected Right side more.  Pt accompanied by: self   PERTINENT HISTORY:   seeing pain management at [facility], Dr. Jola Schmidt for injections LBP;  TKA 2021 R ; then COVD shortly at the end of 2021  x 2 week in the  hospital Also had some injections in the back per Dr. Murray Hodgkins that did help some   PAIN:  Are you having pain? Yes: NPRS scale: 7/10 Pain location: Right leg Pain description: constant throbbing Aggravating factors: lots of activity  Relieving factors: medication   PAIN:  Are you having pain? Yes: NPRS scale: 6/10 Pain location: low back and right leg Pain description: throbbing and nagging aching; constant Aggravating factors: activity Relieving factors: rest, injections  PRECAUTIONS: Fall  WEIGHT BEARING RESTRICTIONS: No  FALLS:  Has patient fallen in last 6 months? No  OCCUPATION: not working  PLOF: Independent with basic ADLs  PATIENT GOALS: get off some of these pain meds  NEXT MD VISIT: PRN  OBJECTIVE:   DIAGNOSTIC FINDINGS:  CLINICAL DATA:  Low back pain. Chronic bilateral low back pain without sciatica. Lumbar facet arthropathy. Lumbar facet joint pain. Degenerative disc disease.   EXAM: LUMBAR SPINE - COMPLETE WITH BENDING VIEWS   COMPARISON:  Lumbar MRI 10/11/2017   FINDINGS: AP, bilateral oblique, lateral, lateral flexion, lateral extension and lateral L5-S1 spot views obtained. 5 non-rib-bearing lumbar vertebra with tiny ribs at L1 as before.   There is 4-5 mm of anterolisthesis of L4 on L5 present on flexion and extension, this is 0 mm on neutral imaging. Alignment is otherwise normal. Vertebral body heights are normal. There is mild L3-L4 and L4-L5 disc space narrowing and spurring. Mild L3-L4, moderate at L4-L5 and mild L5-S1 facet hypertrophy. No evidence of fracture focal bone abnormalities. The sacroiliac joints are congruent.   IMPRESSION: 1. Degenerative disc disease and facet arthropathy at L3-L4 and L4-L5. 2. Grade 1 anterolisthesis of L4 on L5 measuring 4-5 mm on flexion and extension, 0 mm on neutral imaging.     Electronically Signed   By: Narda Rutherford M.D.   On: 07/20/2022 15:18    PATIENT SURVEYS:  FOTO  52  COGNITION: Overall cognitive status: Within functional limits for tasks assessed     SENSATION: Neuropathy both fee  POSTURE: rounded shoulders, forward head, and flexed trunk   PALPATION:   LUMBAR ROM:   AROM eval  Flexion 60% available  Extension Unable to get to neutral  Right lateral flexion   Left lateral flexion   Right rotation   Left rotation    (Blank rows = not tested)   LOWER EXTREMITY MMT:    MMT Right eval Left eval Right 10/18/22 Left 10/18/22 Right 11/16/22 Left 11/16/22  Hip flexion 4 4 4+ 4+ 4+ 4+  Hip extension 2 2   3- 3  Hip abduction        Hip adduction        Hip internal rotation        Hip external rotation        Knee flexion 3- 3-   3+ 3+  Knee extension 4 4 4 5 5 5   Ankle dorsiflexion 4+ 4+ 5 5 5 5   Ankle plantarflexion        Ankle inversion        Ankle eversion         (Blank rows = not tested)   FUNCTIONAL TESTS:  5 times sit to stand: 18.18 sec using arm to push up on thighs.  2 minute walk test: 224 ft (had to stop before time was up; about 36 sec less)  GAIT: Distance walked: 224 ft Assistive device utilized: None Level of assistance: Modified independence Comments: forward flexed trunk; increased back pain  TODAY'S TREATMENT:  DATE: 11/16/22 Progress note FOTO 55 Nustep x 5' dynamic warm up seat 10 5 x sit to stand 14.32 sec no UE assist 2 MWT 264 ft (has to stop and rest briefly) MMT's see above   11/13/22 Standing: Heel raises on incline x 20 Slant board 5 x 20" Tandem stance with bow and arrow green theraband 2 x 10 each Hip abduction 2# 2 x 10 Hip extension 2# 2 x 10 Marching alternating 2# 2X10 Wall arches 2 x 10 Wall push ups 2 x 10 UE flexion against wall 2X10 Sit to stand x 10 no UE assist  10/18/22 Progress note 5 x sit to stand 14.26 sec no UE assist FOTO 57 MMT's see  above  Standing: Heel raises on incline x 20 Slant board 5 x 20" Tandem stance with bow and arrow green theraband 2 x 10 each Wall arches 2 x 10 Wall push ups 2 x 10 Doorway stretch 5 x 10" Hip abduction 2# 2 x 10 Hip extension 2# 2 x 10 Sit to stand x 10 no UE assist Seated: Shoulder horizontal abduction GTB 2 x 10 Thoracic extension over chair x 20  10/16/22 Standing: Heel raises x 10 Slant board 3 x 20" Hip vectors 2" hold; 2 x 5 each  GTB shoulder rows and ext 2 x 10 each Lumbar extension x 10  Sitting: GTB shoulder abduction 2 x 10 Thoracic extension over chair 2 x 10  Sit to stand 3 x 5 no UE assist    10/09/22:  GTB  shoulder extension 10x  Rows 10x  3D hip excursion (Squat then extension 10x front of chair for mechanics, lateral weight shifting with UE reach, rotation)  Hip flexor stance on 12in step 3x30"  Wall arch 10x    10/02/22: Prone press up 10x 10"  Standing: Squat then extension 10x front of chair for mechanics RTB shoulder extension 10x  Rows 10x   Hip flexor stance on 12in step 3x20"  STS 10x eccentric control  09/28/22:  Sidelying abduction 10x Supine: Bridge 2sets 10x Thomas stretch on edge of mat 2 x 30" towel assistance Prone press up 10x 10" Rest break infront of fan to address fatigue STS 5x Standing lumbar extension  09/26/22: Reviewed goals, discussed benefits with HEP compliance for maximal benefits. 2 sets x 5 reps STS with eccentric control Standing lumbar extension Prone press up 10x 30" holds Glut sets 10x 5" Attempted knee bent pushing foot towards ceiling- too difficult Supine:  Bridge 10x 5" eccentric lowering Marching 10x5 " with ab set Sidelying: clam    09/20/22 physical therapy evaluation and HEP instruction    PATIENT EDUCATION:  Education details: Patient educated on exam findings, POC, scope of PT, HEP, and what to expect next visit. Person educated: Patient Education method: Explanation,  Demonstration, and Handouts Education comprehension: verbalized understanding, returned demonstration, verbal cues required, and tactile cues required  HOME EXERCISE PROGRAM: 10/18/22 shoulder habduction with Tband, bow and arrow and sitting thoracic extension Access Code: [email or web address] URL: [email or web address] Date: 09/20/2022 Prepared by: [facility]  Exercises - Static Prone on Elbows  - 2 x daily - 7 x weekly - 1 sets - 1 reps - 3 min hold - Prone Press Up  - 2 x daily - 7 x weekly - 1 sets - 5 reps - Prone Gluteal Sets  - 2 x daily - 7 x weekly - 1 sets - 10 reps - 5" hold - Supine Bridge  - 2 x  daily - 7 x weekly - 3 sets - 10 reps  Access Code: [email or web address] URL: [email or web address] Date: 09/28/2022 Prepared by: Becky Sax  Exercises - Static Prone on Elbows  - 2 x daily - 7 x weekly - 1 sets - 1 reps - 3 min hold - Prone Press Up  - 2 x daily - 7 x weekly - 1 sets - 5 reps - Prone Gluteal Sets  - 2 x daily - 7 x weekly - 1 sets - 10 reps - 5" hold - Supine Bridge  - 2 x daily - 7 x weekly - 3 sets - 10 reps - Supine Lower Trunk Rotation  - 2 x daily - 7 x weekly - 1 sets - 10 reps - Supine Transversus Abdominis Bracing - Hands on Stomach  - 2 x daily - 7 x weekly - 1 sets - 10 reps - Prone Hip Extension  - 1 x daily - 7 x weekly - 1-2 sets - 10 reps- when ready - Clam  - 1 x daily - 7 x weekly - 3 sets - 10 reps - 5" hold -Supine Thomas stretch 3x 30" with towel assistance -ASSESSMENT:  CLINICAL IMPRESSION: Progress note today; similar scores from last progress note for FOTO and 5 time sit to stand but due to patient having to miss some time due to appointment availability; noted good improvement with strength and 2 MWT.  Patient will benefit from continued skilled therapy services to address remaining unmet and partially met goals.     Eval: Patient is a 52 y.o. female who was seen today for physical therapy evaluation and treatment for low back  pain that radiates down right leg.  Patient  presents to physical therapy with complaint of back pain and leg and back weakness. Patient demonstrates muscle weakness, reduced ROM, and fascial restrictions which are likely contributing to symptoms of pain and are negatively impacting patient ability to perform ADLs and functional mobility tasks. Patient will benefit from skilled physical therapy services to address these deficits to reduce pain and improve level of function with ADLs and functional mobility tasks.    OBJECTIVE IMPAIRMENTS: Abnormal gait, decreased activity tolerance, decreased mobility, difficulty walking, decreased ROM, decreased strength, hypomobility, increased fascial restrictions, impaired perceived functional ability, postural dysfunction, and pain.   ACTIVITY LIMITATIONS: carrying, lifting, bending, sitting, standing, squatting, sleeping, stairs, transfers, locomotion level, and caring for others  PARTICIPATION LIMITATIONS: meal prep, cleaning, laundry, shopping, community activity, and yard work  PERSONAL FACTORS: 1 comorbidity: MS  are also affecting patient's functional outcome.   REHAB POTENTIAL: Good  CLINICAL DECISION MAKING: Stable/uncomplicated  EVALUATION COMPLEXITY: Low   GOALS: Goals reviewed with patient? No  SHORT TERM GOALS: Target date: 10/04/2022  patient will be independent with initial HEP   Baseline: Goal status: MET  2.  Patient will self report 30% improvement to improve tolerance for functional activity  Baseline:  Goal status: MET    LONG TERM GOALS: Target date: 11/16/2022  Patient will be independent in self management strategies to improve quality of life and functional outcomes.   Baseline:  Goal status: IN PROGRESS  2.  Patient will self report 50% improvement to improve tolerance for functional activity  Baseline:  Goal status: MET  3.  Patient will improve FOTO score to predicted value (61)   Baseline: 52; 57  10/18/22 Goal status: IN PROGRESS  4.  Patient will increase distance on to 275 ft  to demonstrate improved functional mobility walking  household and community distances.   Baseline: 224 ft; 11/16/22 268 ft  Goal status: IN PROGRESS  5.  Patient will increase  leg MMTs to 4-5/5 without pain to promote return to ambulation community distances with minimal deviation.  Baseline: see above Goal status: IN PROGRESS  PLAN:  PT FREQUENCY: 2x/week  PT DURATION: 4 weeks  PLANNED INTERVENTIONS: Therapeutic exercises, Therapeutic activity, Neuromuscular re-education, Balance training, Gait training, Patient/Family education, Joint manipulation, Joint mobilization, Stair training, Orthotic/Fit training, DME instructions, Aquatic Therapy, Dry Needling, Electrical stimulation, Spinal manipulation, Spinal mobilization, Cryotherapy, Moist heat, Compression bandaging, scar mobilization, Splintting, Taping, Traction, Ultrasound, Ionotophoresis 4mg /ml Dexamethasone, and Manual therapy .  PLAN FOR NEXT SESSION:  extend 2 x a week x 4 weeks to address remaining unmet and partially met goals   3:14 PM, 11/16/22 Iyanni Hepp Small Jayjay Littles MPT [facility] [place] [phone]

## 2022-11-20 ENCOUNTER — Encounter (HOSPITAL_COMMUNITY): Payer: Self-pay

## 2022-11-20 ENCOUNTER — Ambulatory Visit (HOSPITAL_COMMUNITY): Payer: 59

## 2022-11-20 ENCOUNTER — Encounter (HOSPITAL_COMMUNITY): Payer: Self-pay | Admitting: *Deleted

## 2022-11-20 DIAGNOSIS — M545 Low back pain, unspecified: Secondary | ICD-10-CM

## 2022-11-20 DIAGNOSIS — G35 Multiple sclerosis: Secondary | ICD-10-CM | POA: Diagnosis not present

## 2022-11-20 DIAGNOSIS — R262 Difficulty in walking, not elsewhere classified: Secondary | ICD-10-CM

## 2022-11-20 DIAGNOSIS — R2689 Other abnormalities of gait and mobility: Secondary | ICD-10-CM | POA: Diagnosis not present

## 2022-11-20 DIAGNOSIS — M6281 Muscle weakness (generalized): Secondary | ICD-10-CM

## 2022-11-20 NOTE — Therapy (Signed)
OUTPATIENT PHYSICAL THERAPY THORACOLUMBAR TREATMENT    Patient Name: Melinda Moore MRN: [id number] DOB:10-08-1970, 52 y.o., female Today's Date: 11/20/2022  END OF SESSION:  PT End of Session - 11/20/22 1513     Visit Number 10    Number of Visits 16    Date for PT Re-Evaluation 12/14/22    Authorization Type [employer]    Authorization - Number of Visits 60    PT Start Time 1435    PT Stop Time 1513    PT Time Calculation (min) 38 min    Activity Tolerance Patient limited by fatigue;Patient tolerated treatment well    Behavior During Therapy [facility] for tasks assessed/performed               Past Medical History:  Diagnosis Date   Anemia    Anxiety    Bell's palsy    Depression    DVT (deep venous thrombosis) (HCC) 09/02/2012   [place] on 02/13/2012 showed acute DVT in left calf veins and posterior tibial veins   Fibromyalgia    GERD (gastroesophageal reflux disease)    History of blood transfusion 2000   History of trigeminal neuralgia    HTN (hypertension)    patient denies was taking a blood pressure medication in early 2000 but it was migraines   Leukopenia    MS (multiple sclerosis) (HCC) 1997   Neuropathic pain    Peripheral edema    Port catheter in place 12/31/2012   Right bundle branch block    Past Surgical History:  Procedure Laterality Date   ABDOMINAL HYSTERECTOMY     CHOLECYSTECTOMY     COLONOSCOPY WITH PROPOFOL N/A 09/27/2021   Procedure: COLONOSCOPY WITH PROPOFOL;  Surgeon: Corbin Ade, MD;  Location: [facility];  Service: Endoscopy;  Laterality: N/A;  8:00am, asa 2   ESOPHAGOGASTRODUODENOSCOPY  2011   Dr. Jena Gauss. 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 Biopsy from the stomach revealed minimal chronic inactive inflammation but negative for H. pylori   FLEXIBLE SIGMOIDOSCOPY N/A 02/08/2022   Procedure: FLEXIBLE SIGMOIDOSCOPY;  Surgeon: Corbin Ade, MD;   Location: [facility];  Service: Endoscopy;  Laterality: N/A;   LAPAROSCOPIC GASTRIC SLEEVE RESECTION N/A 04/17/2016   Procedure: LAPAROSCOPIC GASTRIC SLEEVE RESECTION WITH UPPER ENDOSCOPY;  Surgeon: Glenna Fellows, MD;  Location: [facility];  Service: General;  Laterality: N/A;   PATELLA-FEMORAL ARTHROPLASTY Right 07/14/2019   Procedure: PATELLA-FEMORAL ARTHROPLASTY;  Surgeon: Teryl Lucy, MD;  Location: [facility];  Service: Orthopedics;  Laterality: Right;   PORTACATH PLACEMENT     TUBAL LIGATION  2001   Patient Active Problem List   Diagnosis Date Noted   Chronic low back pain (1ry area of Pain) (Bilateral) (R>L) w/o sciatica 07/18/2022   Lumbar facet arthropathy (Bilateral) 07/18/2022   Lumbar facet joint pain 07/18/2022   Chronic lower extremity pain (2ry area of Pain) (Right) 07/18/2022   DDD (degenerative disc disease), lumbosacral 07/18/2022   Abnormal MRI, lumbar spine (10/11/2017) 07/18/2022   Lumbosacral foraminal stenosis (Left: L5-S1) 07/18/2022   Obesity, Class II, BMI 35-39.9 07/18/2022   Osteoarthritis of facet joint of lumbar spine 07/18/2022   Primary osteoarthritis of lumbar spine 07/18/2022   History of total knee replacement (Right) 07/18/2022   Chronic knee pain s/p TKR (Right) 07/18/2022   Chronic medial knee pain (Right) 07/18/2022   Decreased range of motion of lumbar spine 07/18/2022   Pharmacologic therapy 07/15/2022   Disorder of  skeletal system 07/15/2022   Problems influencing health status 07/15/2022   Gastroesophageal reflux disease 09/07/2021   Colon cancer screening 09/07/2021   Hx of hysterectomy 06/14/2021   Elevated transaminase level 02/04/2020   Patellofemoral arthritis of knee (Right) 07/14/2019   History of DVT in adulthood 06/23/2019   Gait abnormality 03/12/2017   Radiculopathy due to lumbar intervertebral disc disorder 09/21/2016   Spondylosis without myelopathy or radiculopathy, lumbar region 09/21/2016   History of anxiety 09/08/2016    Primary osteoarthritis of knee (Right) 08/30/2016   Primary insomnia 08/30/2016   Myofascial pain 08/30/2016   Sciatica, right side 12/12/2015   Morbid obesity (HCC) 08/12/2015   Chronic anxiety 07/08/2013   Swelling of breast 04/08/2013   Chronic pain syndrome 03/11/2013   Port catheter in place 12/31/2012   Constipation 10/01/2012   Anemia 10/01/2012   Rectal bleeding 10/01/2012   Elevated alkaline phosphatase level 10/01/2012   MS (multiple sclerosis) (HCC) 09/02/2012   Anxiety 11/29/2011   Headache(784.0) 11/29/2011   ANEMIA 04/12/2010   Other dysphagia 04/12/2010    PCP: Lilyan Punt, MD  REFERRING PROVIDER: Babs Sciara, MD  REFERRING DIAG: M54.9 (ICD-10-CM) - Back pain, unspecified back location, unspecified back pain laterality, unspecified chronicity  Rationale for Evaluation and Treatment: Rehabilitation  THERAPY DIAG:  Low back pain, unspecified back pain laterality, unspecified chronicity, unspecified whether sciatica present  Difficulty in walking, not elsewhere classified  Muscle weakness (generalized)  Other abnormalities of gait and mobility  ONSET DATE: worse since 2017   SUBJECTIVE:                                                                                                                                                                                           SUBJECTIVE STATEMENT: Reports she is feeling good just stiffness today.  Eval: Diagnosis with MS years ago; sees neurologist in [facility] ; has generally done well but in 2017 started having more issues; after TKA and COVD " I just haven't bounced back"; reports stiffness, back pain and leg pain; MS has affected Right side more.  Pt accompanied by: self   PERTINENT HISTORY:   seeing pain management at [facility], Dr. Jola Schmidt for injections LBP;  TKA 2021 R ; then COVD shortly at the end of 2021  x 2 week in the hospital Also had some injections in the back per Dr. Murray Hodgkins that  did help some    PAIN:  Are you having pain? Yes: NPRS scale: 0/10 Pain location: low back and right leg Pain description: throbbing and nagging aching; constant Aggravating factors: activity Relieving factors: rest, injections  PRECAUTIONS: Fall  WEIGHT BEARING RESTRICTIONS: No  FALLS:  Has patient fallen in last 6 months? No  OCCUPATION: not working  PLOF: Independent with basic ADLs  PATIENT GOALS: get off some of these pain meds  NEXT MD VISIT: PCP [place] end of July;  12/04/22 for steroid injection  OBJECTIVE:   DIAGNOSTIC FINDINGS:  CLINICAL DATA:  Low back pain. Chronic bilateral low back pain without sciatica. Lumbar facet arthropathy. Lumbar facet joint pain. Degenerative disc disease.   EXAM: LUMBAR SPINE - COMPLETE WITH BENDING VIEWS   COMPARISON:  Lumbar MRI 10/11/2017   FINDINGS: AP, bilateral oblique, lateral, lateral flexion, lateral extension and lateral L5-S1 spot views obtained. 5 non-rib-bearing lumbar vertebra with tiny ribs at L1 as before.   There is 4-5 mm of anterolisthesis of L4 on L5 present on flexion and extension, this is 0 mm on neutral imaging. Alignment is otherwise normal. Vertebral body heights are normal. There is mild L3-L4 and L4-L5 disc space narrowing and spurring. Mild L3-L4, moderate at L4-L5 and mild L5-S1 facet hypertrophy. No evidence of fracture focal bone abnormalities. The sacroiliac joints are congruent.   IMPRESSION: 1. Degenerative disc disease and facet arthropathy at L3-L4 and L4-L5. 2. Grade 1 anterolisthesis of L4 on L5 measuring 4-5 mm on flexion and extension, 0 mm on neutral imaging.     Electronically Signed   By: Narda Rutherford M.D.   On: 07/20/2022 15:18    PATIENT SURVEYS:  FOTO 52  COGNITION: Overall cognitive status: Within functional limits for tasks assessed     SENSATION: Neuropathy both fee  POSTURE: rounded shoulders, forward head, and flexed trunk   PALPATION:   LUMBAR  ROM:   AROM eval  Flexion 60% available  Extension Unable to get to neutral  Right lateral flexion   Left lateral flexion   Right rotation   Left rotation    (Blank rows = not tested)   LOWER EXTREMITY MMT:    MMT Right eval Left eval Right 10/18/22 Left 10/18/22 Right 11/16/22 Left 11/16/22  Hip flexion 4 4 4+ 4+ 4+ 4+  Hip extension 2 2   3- 3  Hip abduction        Hip adduction        Hip internal rotation        Hip external rotation        Knee flexion 3- 3-   3+ 3+  Knee extension 4 4 4 5 5 5   Ankle dorsiflexion 4+ 4+ 5 5 5 5   Ankle plantarflexion        Ankle inversion        Ankle eversion         (Blank rows = not tested)   FUNCTIONAL TESTS:  5 times sit to stand: 18.18 sec using arm to push up on thighs.  2 minute walk test: 224 ft (had to stop before time was up; about 36 sec less)  GAIT: Distance walked: 224 ft Assistive device utilized: None Level of assistance: Modified independence Comments: forward flexed trunk; increased back pain  TODAY'S TREATMENT:  DATE: 11/20/22 Standing:  Squat front of chair Lumbar rotation Weight shifting with UE overhead Rotate Lumbar extension front of bar Vector stance 5x 5" with UE support Tandem stance 2x 30"  Seated piriformis stretch 2x 30" Seated pelvic rotation 10x  Supine:  Bridge 10x 5"  11/16/22 Progress note FOTO 55 Nustep x 5' dynamic warm up seat 10 5 x sit to stand 14.32 sec no UE assist 2 MWT 264 ft (has to stop and rest briefly) MMT's see above   11/13/22 Standing: Heel raises on incline x 20 Slant board 5 x 20" Tandem stance with bow and arrow green theraband 2 x 10 each Hip abduction 2# 2 x 10 Hip extension 2# 2 x 10 Marching alternating 2# 2X10 Wall arches 2 x 10 Wall push ups 2 x 10 UE flexion against wall 2X10 Sit to stand x 10 no UE assist  10/18/22 Progress  note 5 x sit to stand 14.26 sec no UE assist FOTO 57 MMT's see above  Standing: Heel raises on incline x 20 Slant board 5 x 20" Tandem stance with bow and arrow green theraband 2 x 10 each Wall arches 2 x 10 Wall push ups 2 x 10 Doorway stretch 5 x 10" Hip abduction 2# 2 x 10 Hip extension 2# 2 x 10 Sit to stand x 10 no UE assist Seated: Shoulder horizontal abduction GTB 2 x 10 Thoracic extension over chair x 20  10/16/22 Standing: Heel raises x 10 Slant board 3 x 20" Hip vectors 2" hold; 2 x 5 each  GTB shoulder rows and ext 2 x 10 each Lumbar extension x 10  Sitting: GTB shoulder abduction 2 x 10 Thoracic extension over chair 2 x 10  Sit to stand 3 x 5 no UE assist    10/09/22:  GTB  shoulder extension 10x  Rows 10x  3D hip excursion (Squat then extension 10x front of chair for mechanics, lateral weight shifting with UE reach, rotation)  Hip flexor stance on 12in step 3x30"  Wall arch 10x    10/02/22: Prone press up 10x 10"  Standing: Squat then extension 10x front of chair for mechanics RTB shoulder extension 10x  Rows 10x   Hip flexor stance on 12in step 3x20"  STS 10x eccentric control  09/28/22:  Sidelying abduction 10x Supine: Bridge 2sets 10x Thomas stretch on edge of mat 2 x 30" towel assistance Prone press up 10x 10" Rest break infront of fan to address fatigue STS 5x Standing lumbar extension  09/26/22: Reviewed goals, discussed benefits with HEP compliance for maximal benefits. 2 sets x 5 reps STS with eccentric control Standing lumbar extension Prone press up 10x 30" holds Glut sets 10x 5" Attempted knee bent pushing foot towards ceiling- too difficult Supine:  Bridge 10x 5" eccentric lowering Marching 10x5 " with ab set Sidelying: clam    09/20/22 physical therapy evaluation and HEP instruction    PATIENT EDUCATION:  Education details: Patient educated on exam findings, POC, scope of PT, HEP, and what to expect next  visit. Person educated: Patient Education method: Explanation, Demonstration, and Handouts Education comprehension: verbalized understanding, returned demonstration, verbal cues required, and tactile cues required  HOME EXERCISE PROGRAM: 10/18/22 shoulder habduction with Tband, bow and arrow and sitting thoracic extension Access Code: [email or web address] URL: [email or web address] Date: 09/20/2022 Prepared by: [facility]  Exercises - Static Prone on Elbows  - 2 x daily - 7 x weekly - 1 sets - 1 reps - 3 min hold -  Prone Press Up  - 2 x daily - 7 x weekly - 1 sets - 5 reps - Prone Gluteal Sets  - 2 x daily - 7 x weekly - 1 sets - 10 reps - 5" hold - Supine Bridge  - 2 x daily - 7 x weekly - 3 sets - 10 reps  Access Code: [email or web address] URL: [email or web address] Date: 09/28/2022 Prepared by: Becky Sax  Exercises - Static Prone on Elbows  - 2 x daily - 7 x weekly - 1 sets - 1 reps - 3 min hold - Prone Press Up  - 2 x daily - 7 x weekly - 1 sets - 5 reps - Prone Gluteal Sets  - 2 x daily - 7 x weekly - 1 sets - 10 reps - 5" hold - Supine Bridge  - 2 x daily - 7 x weekly - 3 sets - 10 reps - Supine Lower Trunk Rotation  - 2 x daily - 7 x weekly - 1 sets - 10 reps - Supine Transversus Abdominis Bracing - Hands on Stomach  - 2 x daily - 7 x weekly - 1 sets - 10 reps - Prone Hip Extension  - 1 x daily - 7 x weekly - 1-2 sets - 10 reps- when ready - Clam  - 1 x daily - 7 x weekly - 3 sets - 10 reps - 5" hold -Supine Thomas stretch 3x 30" with towel assistance -ASSESSMENT:  CLINICAL IMPRESSION:  Session focus with mobility and proximal strengthening.  Pt presents with anterior pelvic tilt due to tight hip flexors and weak core/gluteal musculature.  Pt educated on neutral pelvis which was difficult to complete with tactile and verbal cueing.  Pt progressing well with increased height and ability to complete bridges with less fatigue.  No reports of pain through session.  Pt  shown seated piriformis stretch to address when she has sciatic like symptoms for pain control.    Eval: Patient is a 52 y.o. female who was seen today for physical therapy evaluation and treatment for low back pain that radiates down right leg.  Patient  presents to physical therapy with complaint of back pain and leg and back weakness. Patient demonstrates muscle weakness, reduced ROM, and fascial restrictions which are likely contributing to symptoms of pain and are negatively impacting patient ability to perform ADLs and functional mobility tasks. Patient will benefit from skilled physical therapy services to address these deficits to reduce pain and improve level of function with ADLs and functional mobility tasks.    OBJECTIVE IMPAIRMENTS: Abnormal gait, decreased activity tolerance, decreased mobility, difficulty walking, decreased ROM, decreased strength, hypomobility, increased fascial restrictions, impaired perceived functional ability, postural dysfunction, and pain.   ACTIVITY LIMITATIONS: carrying, lifting, bending, sitting, standing, squatting, sleeping, stairs, transfers, locomotion level, and caring for others  PARTICIPATION LIMITATIONS: meal prep, cleaning, laundry, shopping, community activity, and yard work  PERSONAL FACTORS: 1 comorbidity: MS  are also affecting patient's functional outcome.   REHAB POTENTIAL: Good  CLINICAL DECISION MAKING: Stable/uncomplicated  EVALUATION COMPLEXITY: Low   GOALS: Goals reviewed with patient? No  SHORT TERM GOALS: Target date: 10/04/2022  patient will be independent with initial HEP   Baseline: Goal status: MET  2.  Patient will self report 30% improvement to improve tolerance for functional activity  Baseline:  Goal status: MET    LONG TERM GOALS: Target date: 11/16/2022  Patient will be independent in self management strategies to improve quality of life and functional outcomes.  Baseline:  Goal status: IN  PROGRESS  2.  Patient will self report 50% improvement to improve tolerance for functional activity  Baseline:  Goal status: MET  3.  Patient will improve FOTO score to predicted value (61)   Baseline: 52; 57 10/18/22 Goal status: IN PROGRESS  4.  Patient will increase distance on to 275 ft  to demonstrate improved functional mobility walking household and community distances.   Baseline: 224 ft; 11/16/22 268 ft  Goal status: IN PROGRESS  5.  Patient will increase  leg MMTs to 4-5/5 without pain to promote return to ambulation community distances with minimal deviation.  Baseline: see above Goal status: IN PROGRESS  PLAN:  PT FREQUENCY: 2x/week  PT DURATION: 4 weeks  PLANNED INTERVENTIONS: Therapeutic exercises, Therapeutic activity, Neuromuscular re-education, Balance training, Gait training, Patient/Family education, Joint manipulation, Joint mobilization, Stair training, Orthotic/Fit training, DME instructions, Aquatic Therapy, Dry Needling, Electrical stimulation, Spinal manipulation, Spinal mobilization, Cryotherapy, Moist heat, Compression bandaging, scar mobilization, Splintting, Taping, Traction, Ultrasound, Ionotophoresis 4mg /ml Dexamethasone, and Manual therapy .  PLAN FOR NEXT SESSION:  extend 2 x a week x 4 weeks to address remaining unmet and partially met goals  Becky Sax, LPTA/CLT; CBIS [phone]  Juel Burrow, PTA 11/20/2022, 4:49 PM  4:49 PM, 11/20/22

## 2022-11-22 ENCOUNTER — Encounter (HOSPITAL_COMMUNITY): Payer: Self-pay

## 2022-11-23 ENCOUNTER — Ambulatory Visit: Payer: 59 | Admitting: Nurse Practitioner

## 2022-11-26 ENCOUNTER — Ambulatory Visit (HOSPITAL_COMMUNITY): Payer: 59

## 2022-11-26 DIAGNOSIS — M6281 Muscle weakness (generalized): Secondary | ICD-10-CM

## 2022-11-26 DIAGNOSIS — M545 Low back pain, unspecified: Secondary | ICD-10-CM

## 2022-11-26 DIAGNOSIS — R262 Difficulty in walking, not elsewhere classified: Secondary | ICD-10-CM | POA: Diagnosis not present

## 2022-11-26 DIAGNOSIS — G35 Multiple sclerosis: Secondary | ICD-10-CM | POA: Diagnosis not present

## 2022-11-26 DIAGNOSIS — R2689 Other abnormalities of gait and mobility: Secondary | ICD-10-CM | POA: Diagnosis not present

## 2022-11-26 NOTE — Therapy (Signed)
OUTPATIENT PHYSICAL THERAPY THORACOLUMBAR TREATMENT    Patient Name: Melinda Moore MRN: [id number] DOB:01-18-1971, 52 y.o., female Today's Date: 11/26/2022  END OF SESSION:  PT End of Session - 11/26/22 1306     Visit Number 11    Number of Visits 16    Date for PT Re-Evaluation 12/14/22    Authorization Type [employer]    Authorization - Number of Visits 60    PT Start Time 1305    PT Stop Time 1345    PT Time Calculation (min) 40 min    Activity Tolerance Patient limited by fatigue;Patient tolerated treatment well    Behavior During Therapy [facility] for tasks assessed/performed               Past Medical History:  Diagnosis Date   Anemia    Anxiety    Bell's palsy    Depression    DVT (deep venous thrombosis) (HCC) 09/02/2012   [place] on 02/13/2012 showed acute DVT in left calf veins and posterior tibial veins   Fibromyalgia    GERD (gastroesophageal reflux disease)    History of blood transfusion 2000   History of trigeminal neuralgia    HTN (hypertension)    patient denies was taking a blood pressure medication in early 2000 but it was migraines   Leukopenia    MS (multiple sclerosis) (HCC) 1997   Neuropathic pain    Peripheral edema    Port catheter in place 12/31/2012   Right bundle branch block    Past Surgical History:  Procedure Laterality Date   ABDOMINAL HYSTERECTOMY     CHOLECYSTECTOMY     COLONOSCOPY WITH PROPOFOL N/A 09/27/2021   Procedure: COLONOSCOPY WITH PROPOFOL;  Surgeon: Corbin Ade, MD;  Location: [facility];  Service: Endoscopy;  Laterality: N/A;  8:00am, asa 2   ESOPHAGOGASTRODUODENOSCOPY  2011   Dr. Jena Gauss. 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 Biopsy from the stomach revealed minimal chronic inactive inflammation but negative for H. pylori   FLEXIBLE SIGMOIDOSCOPY N/A 02/08/2022   Procedure: FLEXIBLE SIGMOIDOSCOPY;  Surgeon: Corbin Ade, MD;   Location: [facility];  Service: Endoscopy;  Laterality: N/A;   LAPAROSCOPIC GASTRIC SLEEVE RESECTION N/A 04/17/2016   Procedure: LAPAROSCOPIC GASTRIC SLEEVE RESECTION WITH UPPER ENDOSCOPY;  Surgeon: Glenna Fellows, MD;  Location: [facility];  Service: General;  Laterality: N/A;   PATELLA-FEMORAL ARTHROPLASTY Right 07/14/2019   Procedure: PATELLA-FEMORAL ARTHROPLASTY;  Surgeon: Teryl Lucy, MD;  Location: [facility];  Service: Orthopedics;  Laterality: Right;   PORTACATH PLACEMENT     TUBAL LIGATION  2001   Patient Active Problem List   Diagnosis Date Noted   Chronic low back pain (1ry area of Pain) (Bilateral) (R>L) w/o sciatica 07/18/2022   Lumbar facet arthropathy (Bilateral) 07/18/2022   Lumbar facet joint pain 07/18/2022   Chronic lower extremity pain (2ry area of Pain) (Right) 07/18/2022   DDD (degenerative disc disease), lumbosacral 07/18/2022   Abnormal MRI, lumbar spine (10/11/2017) 07/18/2022   Lumbosacral foraminal stenosis (Left: L5-S1) 07/18/2022   Obesity, Class II, BMI 35-39.9 07/18/2022   Osteoarthritis of facet joint of lumbar spine 07/18/2022   Primary osteoarthritis of lumbar spine 07/18/2022   History of total knee replacement (Right) 07/18/2022   Chronic knee pain s/p TKR (Right) 07/18/2022   Chronic medial knee pain (Right) 07/18/2022   Decreased range of motion of lumbar spine 07/18/2022   Pharmacologic therapy 07/15/2022   Disorder of  skeletal system 07/15/2022   Problems influencing health status 07/15/2022   Gastroesophageal reflux disease 09/07/2021   Colon cancer screening 09/07/2021   Hx of hysterectomy 06/14/2021   Elevated transaminase level 02/04/2020   Patellofemoral arthritis of knee (Right) 07/14/2019   History of DVT in adulthood 06/23/2019   Gait abnormality 03/12/2017   Radiculopathy due to lumbar intervertebral disc disorder 09/21/2016   Spondylosis without myelopathy or radiculopathy, lumbar region 09/21/2016   History of anxiety 09/08/2016    Primary osteoarthritis of knee (Right) 08/30/2016   Primary insomnia 08/30/2016   Myofascial pain 08/30/2016   Sciatica, right side 12/12/2015   Morbid obesity (HCC) 08/12/2015   Chronic anxiety 07/08/2013   Swelling of breast 04/08/2013   Chronic pain syndrome 03/11/2013   Port catheter in place 12/31/2012   Constipation 10/01/2012   Anemia 10/01/2012   Rectal bleeding 10/01/2012   Elevated alkaline phosphatase level 10/01/2012   MS (multiple sclerosis) (HCC) 09/02/2012   Anxiety 11/29/2011   Headache(784.0) 11/29/2011   ANEMIA 04/12/2010   Other dysphagia 04/12/2010    PCP: Lilyan Punt, MD  REFERRING PROVIDER: Babs Sciara, MD  REFERRING DIAG: M54.9 (ICD-10-CM) - Back pain, unspecified back location, unspecified back pain laterality, unspecified chronicity  Rationale for Evaluation and Treatment: Rehabilitation  THERAPY DIAG:  Low back pain, unspecified back pain laterality, unspecified chronicity, unspecified whether sciatica present  Difficulty in walking, not elsewhere classified  Muscle weakness (generalized)  Other abnormalities of gait and mobility  ONSET DATE: worse since 2017   SUBJECTIVE:                                                                                                                                                                                           SUBJECTIVE STATEMENT: 8/10 pain right quad; 3/10 in low back; right quad is point tender  Eval: Diagnosis with MS years ago; sees neurologist in [place] ; has generally done well but in 2017 started having more issues; after TKA and COVD " I just haven't bounced back"; reports stiffness, back pain and leg pain; MS has affected Right side more.  Pt accompanied by: self   PERTINENT HISTORY:   seeing pain management at [facility], Dr. Jola Schmidt for injections LBP;  TKA 2021 R ; then COVD shortly at the end of 2021  x 2 week in the hospital Also had some injections in the back per  Dr. Murray Hodgkins that did help some    PAIN:  Are you having pain? Yes: NPRS scale: 0/10 Pain location: low back and right leg Pain description: throbbing and nagging aching; constant Aggravating factors: activity Relieving factors: rest,  injections  PRECAUTIONS: Fall  WEIGHT BEARING RESTRICTIONS: No  FALLS:  Has patient fallen in last 6 months? No  OCCUPATION: not working  PLOF: Independent with basic ADLs  PATIENT GOALS: get off some of these pain meds  NEXT MD VISIT: PCP [place] end of July;  12/04/22 for steroid injection  OBJECTIVE:   DIAGNOSTIC FINDINGS:  CLINICAL DATA:  Low back pain. Chronic bilateral low back pain without sciatica. Lumbar facet arthropathy. Lumbar facet joint pain. Degenerative disc disease.   EXAM: LUMBAR SPINE - COMPLETE WITH BENDING VIEWS   COMPARISON:  Lumbar MRI 10/11/2017   FINDINGS: AP, bilateral oblique, lateral, lateral flexion, lateral extension and lateral L5-S1 spot views obtained. 5 non-rib-bearing lumbar vertebra with tiny ribs at L1 as before.   There is 4-5 mm of anterolisthesis of L4 on L5 present on flexion and extension, this is 0 mm on neutral imaging. Alignment is otherwise normal. Vertebral body heights are normal. There is mild L3-L4 and L4-L5 disc space narrowing and spurring. Mild L3-L4, moderate at L4-L5 and mild L5-S1 facet hypertrophy. No evidence of fracture focal bone abnormalities. The sacroiliac joints are congruent.   IMPRESSION: 1. Degenerative disc disease and facet arthropathy at L3-L4 and L4-L5. 2. Grade 1 anterolisthesis of L4 on L5 measuring 4-5 mm on flexion and extension, 0 mm on neutral imaging.     Electronically Signed   By: Narda Rutherford M.D.   On: 07/20/2022 15:18    PATIENT SURVEYS:  FOTO 52  COGNITION: Overall cognitive status: Within functional limits for tasks assessed     SENSATION: Neuropathy both fee  POSTURE: rounded shoulders, forward head, and flexed trunk    PALPATION:   LUMBAR ROM:   AROM eval  Flexion 60% available  Extension Unable to get to neutral  Right lateral flexion   Left lateral flexion   Right rotation   Left rotation    (Blank rows = not tested)   LOWER EXTREMITY MMT:    MMT Right eval Left eval Right 10/18/22 Left 10/18/22 Right 11/16/22 Left 11/16/22  Hip flexion 4 4 4+ 4+ 4+ 4+  Hip extension 2 2   3- 3  Hip abduction        Hip adduction        Hip internal rotation        Hip external rotation        Knee flexion 3- 3-   3+ 3+  Knee extension 4 4 4 5 5 5   Ankle dorsiflexion 4+ 4+ 5 5 5 5   Ankle plantarflexion        Ankle inversion        Ankle eversion         (Blank rows = not tested)   FUNCTIONAL TESTS:  5 times sit to stand: 18.18 sec using arm to push up on thighs.  2 minute walk test: 224 ft (had to stop before time was up; about 36 sec less)  GAIT: Distance walked: 224 ft Assistive device utilized: None Level of assistance: Modified independence Comments: forward flexed trunk; increased back pain  TODAY'S TREATMENT:  DATE: 11/26/22 Standing: Lumbar extension x 15 Squats to chair x 10 Heel raises on incline 2 x 10 Incline board 5 x 20" Wall bilateral UE flexion x 10  Sitting on ball pelvic clock 12 to 6; anterior and posterior rotation 2 x 10  Tandem stance 3 x 10" each  Sitting Thoracic extensions x 10 Thoracic extension with rotation x 10 GTB shoulder horizontal abduction 2 x 10 Piriformis stretch x 20" each side   11/20/22 Standing:  Squat front of chair Lumbar rotation Weight shifting with UE overhead Rotate Lumbar extension front of bar Vector stance 5x 5" with UE support Tandem stance 2x 30"  Seated piriformis stretch 2x 30" Seated pelvic rotation 10x  Supine:  Bridge 10x 5"  11/16/22 Progress note FOTO 55 Nustep x 5' dynamic warm up seat  10 5 x sit to stand 14.32 sec no UE assist 2 MWT 264 ft (has to stop and rest briefly) MMT's see above   11/13/22 Standing: Heel raises on incline x 20 Slant board 5 x 20" Tandem stance with bow and arrow green theraband 2 x 10 each Hip abduction 2# 2 x 10 Hip extension 2# 2 x 10 Marching alternating 2# 2X10 Wall arches 2 x 10 Wall push ups 2 x 10 UE flexion against wall 2X10 Sit to stand x 10 no UE assist  10/18/22 Progress note 5 x sit to stand 14.26 sec no UE assist FOTO 57 MMT's see above  Standing: Heel raises on incline x 20 Slant board 5 x 20" Tandem stance with bow and arrow green theraband 2 x 10 each Wall arches 2 x 10 Wall push ups 2 x 10 Doorway stretch 5 x 10" Hip abduction 2# 2 x 10 Hip extension 2# 2 x 10 Sit to stand x 10 no UE assist Seated: Shoulder horizontal abduction GTB 2 x 10 Thoracic extension over chair x 20  10/16/22 Standing: Heel raises x 10 Slant board 3 x 20" Hip vectors 2" hold; 2 x 5 each  GTB shoulder rows and ext 2 x 10 each Lumbar extension x 10  Sitting: GTB shoulder abduction 2 x 10 Thoracic extension over chair 2 x 10  Sit to stand 3 x 5 no UE assist    10/09/22:  GTB  shoulder extension 10x  Rows 10x  3D hip excursion (Squat then extension 10x front of chair for mechanics, lateral weight shifting with UE reach, rotation)  Hip flexor stance on 12in step 3x30"  Wall arch 10x    10/02/22: Prone press up 10x 10"  Standing: Squat then extension 10x front of chair for mechanics RTB shoulder extension 10x  Rows 10x   Hip flexor stance on 12in step 3x20"  STS 10x eccentric control  09/28/22:  Sidelying abduction 10x Supine: Bridge 2sets 10x Thomas stretch on edge of mat 2 x 30" towel assistance Prone press up 10x 10" Rest break infront of fan to address fatigue STS 5x Standing lumbar extension  09/26/22: Reviewed goals, discussed benefits with HEP compliance for maximal benefits. 2 sets x 5 reps STS with  eccentric control Standing lumbar extension Prone press up 10x 30" holds Glut sets 10x 5" Attempted knee bent pushing foot towards ceiling- too difficult Supine:  Bridge 10x 5" eccentric lowering Marching 10x5 " with ab set Sidelying: clam    09/20/22 physical therapy evaluation and HEP instruction    PATIENT EDUCATION:  Education details: Patient educated on exam findings, POC, scope of PT, HEP, and what to  expect next visit. Person educated: Patient Education method: Explanation, Demonstration, and Handouts Education comprehension: verbalized understanding, returned demonstration, verbal cues required, and tactile cues required  HOME EXERCISE PROGRAM: 10/18/22 shoulder habduction with Tband, bow and arrow and sitting thoracic extension Access Code: [email or web address] URL: [email or web address] Date: 09/20/2022 Prepared by: [facility]  Exercises - Static Prone on Elbows  - 2 x daily - 7 x weekly - 1 sets - 1 reps - 3 min hold - Prone Press Up  - 2 x daily - 7 x weekly - 1 sets - 5 reps - Prone Gluteal Sets  - 2 x daily - 7 x weekly - 1 sets - 10 reps - 5" hold - Supine Bridge  - 2 x daily - 7 x weekly - 3 sets - 10 reps  Access Code: [email or web address] URL: [email or web address] Date: 09/28/2022 Prepared by: Becky Sax  Exercises - Static Prone on Elbows  - 2 x daily - 7 x weekly - 1 sets - 1 reps - 3 min hold - Prone Press Up  - 2 x daily - 7 x weekly - 1 sets - 5 reps - Prone Gluteal Sets  - 2 x daily - 7 x weekly - 1 sets - 10 reps - 5" hold - Supine Bridge  - 2 x daily - 7 x weekly - 3 sets - 10 reps - Supine Lower Trunk Rotation  - 2 x daily - 7 x weekly - 1 sets - 10 reps - Supine Transversus Abdominis Bracing - Hands on Stomach  - 2 x daily - 7 x weekly - 1 sets - 10 reps - Prone Hip Extension  - 1 x daily - 7 x weekly - 1-2 sets - 10 reps- when ready - Clam  - 1 x daily - 7 x weekly - 3 sets - 10 reps - 5" hold -Supine Thomas stretch 3x 30" with towel  assistance -ASSESSMENT:  CLINICAL IMPRESSION:  Today's session continued to work on postural strength and lumbar and core mobility; tried pelvic tilts on physioball with some ability to move the pelvis although still quite stiff.  Patient continues with challenge with tandem stance.  Patient will benefit from continued skilled therapy services to address deficits and promote return to optimal function.     Eval: Patient is a 52 y.o. female who was seen today for physical therapy evaluation and treatment for low back pain that radiates down right leg.  Patient  presents to physical therapy with complaint of back pain and leg and back weakness. Patient demonstrates muscle weakness, reduced ROM, and fascial restrictions which are likely contributing to symptoms of pain and are negatively impacting patient ability to perform ADLs and functional mobility tasks. Patient will benefit from skilled physical therapy services to address these deficits to reduce pain and improve level of function with ADLs and functional mobility tasks.    OBJECTIVE IMPAIRMENTS: Abnormal gait, decreased activity tolerance, decreased mobility, difficulty walking, decreased ROM, decreased strength, hypomobility, increased fascial restrictions, impaired perceived functional ability, postural dysfunction, and pain.   ACTIVITY LIMITATIONS: carrying, lifting, bending, sitting, standing, squatting, sleeping, stairs, transfers, locomotion level, and caring for others  PARTICIPATION LIMITATIONS: meal prep, cleaning, laundry, shopping, community activity, and yard work  PERSONAL FACTORS: 1 comorbidity: MS  are also affecting patient's functional outcome.   REHAB POTENTIAL: Good  CLINICAL DECISION MAKING: Stable/uncomplicated  EVALUATION COMPLEXITY: Low   GOALS: Goals reviewed with patient? No  SHORT TERM GOALS: Target date: 10/04/2022  patient will be  independent with initial HEP   Baseline: Goal status: MET  2.   Patient will self report 30% improvement to improve tolerance for functional activity  Baseline:  Goal status: MET    LONG TERM GOALS: Target date: 11/16/2022  Patient will be independent in self management strategies to improve quality of life and functional outcomes.   Baseline:  Goal status: IN PROGRESS  2.  Patient will self report 50% improvement to improve tolerance for functional activity  Baseline:  Goal status: MET  3.  Patient will improve FOTO score to predicted value (61)   Baseline: 52; 57 10/18/22 Goal status: IN PROGRESS  4.  Patient will increase distance on to 275 ft  to demonstrate improved functional mobility walking household and community distances.   Baseline: 224 ft; 11/16/22 268 ft  Goal status: IN PROGRESS  5.  Patient will increase  leg MMTs to 4-5/5 without pain to promote return to ambulation community distances with minimal deviation.  Baseline: see above Goal status: IN PROGRESS  PLAN:  PT FREQUENCY: 2x/week  PT DURATION: 4 weeks  PLANNED INTERVENTIONS: Therapeutic exercises, Therapeutic activity, Neuromuscular re-education, Balance training, Gait training, Patient/Family education, Joint manipulation, Joint mobilization, Stair training, Orthotic/Fit training, DME instructions, Aquatic Therapy, Dry Needling, Electrical stimulation, Spinal manipulation, Spinal mobilization, Cryotherapy, Moist heat, Compression bandaging, scar mobilization, Splintting, Taping, Traction, Ultrasound, Ionotophoresis 4mg /ml Dexamethasone, and Manual therapy .  PLAN FOR NEXT SESSION:  extend 2 x a week x 4 weeks to address remaining unmet and partially met goals  1:44 PM, 11/26/22 Jaishawn Witzke Small Larue Drawdy MPT [facility] [place] [phone]

## 2022-11-28 LAB — COLOGUARD: Cologuard: NEGATIVE

## 2022-11-29 ENCOUNTER — Ambulatory Visit: Payer: 59

## 2022-11-30 ENCOUNTER — Encounter: Payer: Self-pay | Admitting: Nurse Practitioner

## 2022-11-30 ENCOUNTER — Ambulatory Visit (INDEPENDENT_AMBULATORY_CARE_PROVIDER_SITE_OTHER): Payer: 59 | Admitting: Nurse Practitioner

## 2022-11-30 VITALS — BP 117/71 | Ht 69.0 in | Wt 280.0 lb

## 2022-11-30 DIAGNOSIS — Z6841 Body Mass Index (BMI) 40.0 and over, adult: Secondary | ICD-10-CM

## 2022-11-30 DIAGNOSIS — E669 Obesity, unspecified: Secondary | ICD-10-CM

## 2022-11-30 NOTE — Patient Instructions (Signed)
[email or web address]

## 2022-11-30 NOTE — Progress Notes (Signed)
   Subjective:    Patient ID: Melinda Moore, female    DOB: 13-Jul-1970, 52 y.o.   MRN: [id number]  HPI  Patient arrives for a follow up on Adipex. Patient states she only took it 2 times because she did not like how it made her feel Took the full dose of phentermine, has not tried taking half a tablet.  States she felt slightly dizzy.  Side effects resolved after stopping medication.  See previous note 10/22/2022.  Review of Systems  Respiratory:  Negative for cough, chest tightness and shortness of breath.   Cardiovascular:  Negative for chest pain.      11/30/2022    2:37 PM  Depression screen PHQ 2/9  Decreased Interest 0  Down, Depressed, Hopeless 0  PHQ - 2 Score 0  Altered sleeping 0  Tired, decreased energy 0  Change in appetite 0  Trouble concentrating 0  Moving slowly or fidgety/restless 0  Suicidal thoughts 0  PHQ-9 Score 0  Difficult doing work/chores Not difficult at all      11/07/2022    9:22 AM 09/20/2022   10:01 AM 08/15/2022   11:11 AM 06/05/2022    5:00 PM  GAD 7 : Generalized Anxiety Score  Nervous, Anxious, on Edge 0 0 1 0  Control/stop worrying 0 0 0 0  Worry too much - different things 0 0 1 0  Trouble relaxing 0 0 0 0  Restless 0 0 0 0  Easily annoyed or irritable 0 0 0 0  Afraid - awful might happen 0 0 0 0  Total GAD 7 Score 0 0 2 0  Anxiety Difficulty  Not difficult at all  Not difficult at all         Objective:   Physical Exam NAD.  Alert, oriented.  Lungs clear.  Heart regular rate rhythm. Today's Vitals   11/30/22 1435  BP: 117/71  Weight: 280 lb (127 kg)  Height: 5\' 9"  (1.753 m)   Body mass index is 41.35 kg/m.        Assessment & Plan:   Problem List Items Addressed This Visit       Other   Obesity, Class II, BMI 35-39.9 - Primary (Chronic)   Recommend trial of half tab of phentermine daily.  Discontinue if any further side effects. If phentermine is tolerated at half dose, consider Qsymia.  Patient will check into the  cost. Still interested in trying [facility], will check to see if her husband's insurance will cover this.  Understands that it is very expensive out-of-pocket. Notify office if she wishes to try a different medication.

## 2022-12-04 ENCOUNTER — Encounter: Payer: Self-pay | Admitting: Nurse Practitioner

## 2022-12-04 DIAGNOSIS — M5416 Radiculopathy, lumbar region: Secondary | ICD-10-CM | POA: Diagnosis not present

## 2022-12-04 LAB — COLOGUARD: COLOGUARD: NEGATIVE

## 2022-12-06 ENCOUNTER — Ambulatory Visit
Admission: RE | Admit: 2022-12-06 | Discharge: 2022-12-06 | Disposition: A | Discharge status: Home / Self Care | Payer: 59 | Source: Ambulatory Visit | Attending: Emergency Medicine | Admitting: Emergency Medicine

## 2022-12-06 VITALS — BP 113/70 | HR 88 | Temp 97.7°F | Resp 18

## 2022-12-06 DIAGNOSIS — R300 Dysuria: Secondary | ICD-10-CM | POA: Diagnosis not present

## 2022-12-06 DIAGNOSIS — N390 Urinary tract infection, site not specified: Secondary | ICD-10-CM | POA: Diagnosis not present

## 2022-12-06 LAB — POCT URINALYSIS DIP (MANUAL ENTRY)
Bilirubin, UA: NEGATIVE
Blood, UA: NEGATIVE
Glucose, UA: NEGATIVE mg/dL
Ketones, POC UA: NEGATIVE mg/dL
Nitrite, UA: POSITIVE — AB
Protein Ur, POC: NEGATIVE mg/dL
Spec Grav, UA: 1.02 (ref 1.010–1.025)
Urobilinogen, UA: 0.2 E.U./dL
pH, UA: 5 (ref 5.0–8.0)

## 2022-12-06 MED ORDER — NITROFURANTOIN MONOHYD MACRO 100 MG PO CAPS: 100.0000 mg | ORAL_CAPSULE | Freq: Two times a day (BID) | ORAL | 0 refills | Status: AC

## 2022-12-06 MED ORDER — PHENAZOPYRIDINE HCL 200 MG PO TABS: 200.0000 mg | ORAL_TABLET | Freq: Three times a day (TID) | ORAL | 0 refills | Status: DC | PRN

## 2022-12-06 NOTE — ED Provider Notes (Signed)
HPI  SUBJECTIVE:  Melinda Moore is a 52 y.o. female who presents with 2 weeks of dysuria, urgency, cloudy and odorous urine, decreased appetite and nausea.  No urinary frequency, hematuria, vaginal odor, bleeding, discharge, vomiting, fevers, abdominal, back, pelvic pain.  She is in a long-term monogamous relationship with her husband, who is asymptomatic.  STDs are not a concern today.  No antibiotics in the past month.  She took ibuprofen within 6 hours of evaluation.  She has also tried Azo and pushing fluids.  No aggravating factors.  She has a past medical history of MS, fibromyalgia, chronic low back and knee pain, UTI.  No history of pyelonephritis, nephrolithiasis, STDs, BV, yeast.  PCP: [facility].  Urine culture from 09/27/2021 grew out E. coli resistant to tetracycline, otherwise sensitive to Augmentin, cephalosporins, Macrobid and Bactrim.  Past Medical History:  Diagnosis Date   Anemia    Anxiety    Bell's palsy    Depression    DVT (deep venous thrombosis) (HCC) 09/02/2012   [place] on 02/13/2012 showed acute DVT in left calf veins and posterior tibial veins   Fibromyalgia    GERD (gastroesophageal reflux disease)    History of blood transfusion 2000   History of trigeminal neuralgia    HTN (hypertension)    patient denies was taking a blood pressure medication in early 2000 but it was migraines   Leukopenia    MS (multiple sclerosis) (HCC) 1997   Neuropathic pain    Peripheral edema    Port catheter in place 12/31/2012   Right bundle branch block     Past Surgical History:  Procedure Laterality Date   ABDOMINAL HYSTERECTOMY     CHOLECYSTECTOMY     COLONOSCOPY WITH PROPOFOL N/A 09/27/2021   Procedure: COLONOSCOPY WITH PROPOFOL;  Surgeon: Corbin Ade, MD;  Location: [facility];  Service: Endoscopy;  Laterality: N/A;  8:00am, asa 2   ESOPHAGOGASTRODUODENOSCOPY  2011   Dr. Jena Gauss. 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 Biopsy from the stomach revealed minimal chronic inactive inflammation but negative for H. pylori   FLEXIBLE SIGMOIDOSCOPY N/A 02/08/2022   Procedure: FLEXIBLE SIGMOIDOSCOPY;  Surgeon: Corbin Ade, MD;  Location: [facility];  Service: Endoscopy;  Laterality: N/A;   LAPAROSCOPIC GASTRIC SLEEVE RESECTION N/A 04/17/2016   Procedure: LAPAROSCOPIC GASTRIC SLEEVE RESECTION WITH UPPER ENDOSCOPY;  Surgeon: Glenna Fellows, MD;  Location: [facility];  Service: General;  Laterality: N/A;   PATELLA-FEMORAL ARTHROPLASTY Right 07/14/2019   Procedure: PATELLA-FEMORAL ARTHROPLASTY;  Surgeon: Teryl Lucy, MD;  Location: [facility];  Service: Orthopedics;  Laterality: Right;   PORTACATH PLACEMENT     TUBAL LIGATION  2001    Family History  Problem Relation Age of Onset   Healthy Mother    Heart disease Father    Hyperlipidemia Father    Congestive Heart Failure Father    Hypertension Sister    Colon polyps Sister    Colon polyps Sister    Colon polyps Sister    Colon polyps Sister    Cirrhosis Brother        etoh   Healthy Daughter    Healthy Daughter    Colon cancer Paternal Uncle     Social History   Tobacco Use   Smoking status: Former    Packs/day: .25    Types: Cigarettes    Quit date: 1990    Years since quitting: 34.5  Smokeless tobacco: Never   Tobacco comments:    4 a day, quit in 1990  Vaping Use   Vaping Use: Never used  Substance Use Topics   Alcohol use: No   Drug use: No    No current facility-administered medications for this encounter.  Current Outpatient Medications:    nitrofurantoin, macrocrystal-monohydrate, (MACROBID) 100 MG capsule, Take 1 capsule (100 mg total) by mouth 2 (two) times daily for 5 days., Disp: 10 capsule, Rfl: 0   phenazopyridine (PYRIDIUM) 200 MG tablet, Take 1 tablet (200 mg total) by mouth 3 (three) times daily as needed for pain., Disp: 6 tablet, Rfl: 0   ascorbic acid (VITAMIN C)  500 MG tablet, Take 1 tablet (500 mg total) by mouth daily., Disp: 30 tablet, Rfl: 0   baclofen (LIORESAL) 10 MG tablet, Take 10 mg by mouth 2 (two) times daily., Disp: , Rfl:    Bioflavonoid Products (BIOFLEX) TABS, Take 2 tablets by mouth daily., Disp: , Rfl:    buPROPion (WELLBUTRIN SR) 150 MG 12 hr tablet, TAKE ONE TABLET BY MOUTH TWICE A DAY, Disp: 180 tablet, Rfl: 1   carbamazepine (TEGRETOL XR) 200 MG 12 hr tablet, Take 1 tablet (200 mg total) by mouth 3 (three) times daily as needed., Disp: 90 tablet, Rfl: 1   Cholecalciferol (VITAMIN D3) 125 MCG (5000 UT) TABS, Take 20,000 Units by mouth 2 (two) times daily., Disp: , Rfl:    gabapentin (NEURONTIN) 300 MG capsule, Take 1,200 mg by mouth 3 (three) times daily., Disp: , Rfl:    HYDROmorphone (DILAUDID) 4 MG tablet, 1 tablet po qid prn, max use per day is 4 tablets., Disp: 120 tablet, Rfl: 0   HYDROmorphone (DILAUDID) 4 MG tablet, 1 tablet po qid prn, max use per day is 4 tablets, Disp: 120 tablet, Rfl: 0   HYDROmorphone (DILAUDID) 4 MG tablet, 1 qid prn pain caution drowsiness, Disp: 120 tablet, Rfl: 0   Multiple Vitamin (MULTIVITAMIN WITH MINERALS) TABS tablet, Take 1 tablet by mouth daily. ALIVE, Disp: , Rfl:    Multiple Vitamins-Minerals (EMERGEN-C IMMUNE) PACK, Take 1 packet by mouth once a week., Disp: , Rfl:    Naldemedine Tosylate (SYMPROIC) 0.2 MG TABS, TAKE ONE TABLET (0.2MG ) BY MOUTH DAILY, Disp: 30 tablet, Rfl: 5   naloxone (NARCAN) 4 MG/0.1ML LIQD nasal spray kit, SPRAY INTO NOSE AS DIRECTED FOR ACCIDENTAL OVERDOSE, Disp: 2 each, Rfl: 4   Ofatumumab (KESIMPTA) 20 MG/0.4ML SOAJ, Inject into the skin., Disp: , Rfl:    ondansetron (ZOFRAN) 8 MG tablet, Take 1 tablet (8 mg total) by mouth 2 (two) times daily as needed for nausea or vomiting., Disp: 60 tablet, Rfl: 11   pantoprazole (PROTONIX) 40 MG tablet, TAKE ONE TABLET BY MOUTH TWICE A DAY BEFORE A MEAL, Disp: 60 tablet, Rfl: 5   phentermine (ADIPEX-P) 37.5 MG tablet, Take 1 tablet  (37.5 mg total) by mouth daily before breakfast., Disp: 30 tablet, Rfl: 0   potassium chloride SA (KLOR-CON M) 20 MEQ tablet, 1 qd, Disp: 30 tablet, Rfl: 6   sertraline (ZOLOFT) 50 MG tablet, Take 0.5 tablets (25 mg total) by mouth 2 (two) times daily., Disp: 30 tablet, Rfl: 5   torsemide (DEMADEX) 20 MG tablet, Take 1/2 to 1  tablet po q am prn pedal edema, Disp: 30 tablet, Rfl: 5   zinc sulfate 220 (50 Zn) MG capsule, Take 1 capsule (220 mg total) by mouth daily., Disp: 30 capsule, Rfl: 0  Allergies  Allergen Reactions  Ambien [Zolpidem Tartrate]     nightmares   Tape Itching and Rash     ROS  As noted in HPI.   Physical Exam  BP 113/70 (BP Location: Right Arm)   Pulse 88   Temp 97.7 F (36.5 C) (Oral)   Resp 18   SpO2 93%   Constitutional: Well developed, well nourished, no acute distress Eyes:  EOMI, conjunctiva normal bilaterally HENT: Normocephalic, atraumatic,mucus membranes moist Respiratory: Normal inspiratory effort Cardiovascular: Normal rate GI: nondistended soft, nontender.  No suprapubic, flank tenderness Back: No CVAT skin: No rash, skin intact Musculoskeletal: no deformities Neurologic: Alert & oriented x 3, no focal neuro deficits Psychiatric: Speech and behavior appropriate   ED Course   Medications - No data to display  Orders Placed This Encounter  Procedures   Urine Culture    Standing Status:   Standing    Number of Occurrences:   1    Order Specific Question:   Indication    Answer:   Dysuria   POCT urinalysis dipstick    Standing Status:   Standing    Number of Occurrences:   1    Results for orders placed or performed during the hospital encounter of 12/06/22 (from the past 24 hour(s))  POCT urinalysis dipstick     Status: Abnormal   Collection Time: 12/06/22 11:54 AM  Result Value Ref Range   Color, UA yellow yellow   Clarity, UA clear clear   Glucose, UA negative negative mg/dL   Bilirubin, UA negative negative   Ketones,  POC UA negative negative mg/dL   Spec Grav, UA 0.160 1.093 - 1.025   Blood, UA negative negative   pH, UA 5.0 5.0 - 8.0   Protein Ur, POC negative negative mg/dL   Urobilinogen, UA 0.2 0.2 or 1.0 E.U./dL   Nitrite, UA Positive (A) Negative   Leukocytes, UA Trace (A) Negative   No results found.  ED Clinical Impression  1. Urinary tract infection without hematuria, site unspecified   2. Dysuria      ED Assessment/Plan    Previous labs reviewed.  As noted in HPI.  Presentation and urine dip consistent with UTI with positive nitrites leukocytes.  Normal vitals.  No evidence of pyelonephritis.  Will send off for culture to confirm diagnosis and antibiotic choice.  Home with Pyridium, Macrobid.  Continue pushing fluids.  ER return precautions given.  Discussed labs, MDM, treatment plan, and plan for follow-up with patient. Discussed sn/sx that should prompt return to the ED. patient agrees with plan.   Meds ordered this encounter  Medications   nitrofurantoin, macrocrystal-monohydrate, (MACROBID) 100 MG capsule    Sig: Take 1 capsule (100 mg total) by mouth 2 (two) times daily for 5 days.    Dispense:  10 capsule    Refill:  0   phenazopyridine (PYRIDIUM) 200 MG tablet    Sig: Take 1 tablet (200 mg total) by mouth 3 (three) times daily as needed for pain.    Dispense:  6 tablet    Refill:  0      *This clinic note was created using Scientist, clinical (histocompatibility and immunogenetics). Therefore, there may be occasional mistakes despite careful proofreading.  ?    Domenick Gong, MD 12/06/22 1213

## 2022-12-06 NOTE — ED Triage Notes (Signed)
Burning on urination and urinary frequency x 2 weeks.

## 2022-12-06 NOTE — ED Notes (Signed)
Has been taking AZO to help with symptoms.

## 2022-12-06 NOTE — Discharge Instructions (Signed)
Continue pushing fluids.  May continue ibuprofen 3 times a day as needed for pain.  We will contact you if your urine culture comes back resistant to Macrobid and we need to change your antibiotics.  To the ER for the signs and symptoms we discussed.

## 2022-12-07 LAB — CBC WITH DIFFERENTIAL/PLATELET
Basophils Absolute: 0.1 10*3/uL (ref 0.0–0.2)
Basos: 2 %
EOS (ABSOLUTE): 0.4 10*3/uL (ref 0.0–0.4)
Eos: 9 %
Hematocrit: 38 % (ref 34.0–46.6)
Hemoglobin: 11.9 g/dL (ref 11.1–15.9)
Immature Grans (Abs): 0 10*3/uL (ref 0.0–0.1)
Immature Granulocytes: 0 %
Lymphocytes Absolute: 1.1 10*3/uL (ref 0.7–3.1)
Lymphs: 25 %
MCH: 25.5 pg — ABNORMAL LOW (ref 26.6–33.0)
MCHC: 31.3 g/dL — ABNORMAL LOW (ref 31.5–35.7)
MCV: 82 fL (ref 79–97)
Monocytes Absolute: 0.5 10*3/uL (ref 0.1–0.9)
Monocytes: 11 %
Neutrophils Absolute: 2.3 10*3/uL (ref 1.4–7.0)
Neutrophils: 53 %
Platelets: 328 10*3/uL (ref 150–450)
RBC: 4.66 x10E6/uL (ref 3.77–5.28)
RDW: 14.2 % (ref 11.7–15.4)
WBC: 4.3 10*3/uL (ref 3.4–10.8)

## 2022-12-07 LAB — LIPID PANEL
Chol/HDL Ratio: 4.5 ratio — ABNORMAL HIGH (ref 0.0–4.4)
Cholesterol, Total: 251 mg/dL — ABNORMAL HIGH (ref 100–199)
HDL: 56 mg/dL (ref >39)
LDL Chol Calc (NIH): 169 mg/dL — ABNORMAL HIGH (ref 0–99)
Triglycerides: 146 mg/dL (ref 0–149)
VLDL Cholesterol Cal: 26 mg/dL (ref 5–40)

## 2022-12-07 LAB — HEPATIC FUNCTION PANEL
ALT: 13 IU/L (ref 0–32)
AST: 16 IU/L (ref 0–40)
Albumin: 4.3 g/dL (ref 3.8–4.9)
Alkaline Phosphatase: 169 IU/L — ABNORMAL HIGH (ref 44–121)
Bilirubin Total: 0.2 mg/dL (ref 0.0–1.2)
Bilirubin, Direct: <0.1 mg/dL (ref 0.00–0.40)
Total Protein: 6.9 g/dL (ref 6.0–8.5)

## 2022-12-07 LAB — BASIC METABOLIC PANEL
BUN/Creatinine Ratio: 8 — ABNORMAL LOW (ref 9–23)
BUN: 8 mg/dL (ref 6–24)
CO2: 23 mmol/L (ref 20–29)
Calcium: 9.3 mg/dL (ref 8.7–10.2)
Chloride: 103 mmol/L (ref 96–106)
Creatinine, Ser: 0.98 mg/dL (ref 0.57–1.00)
Glucose: 91 mg/dL (ref 70–99)
Potassium: 4.8 mmol/L (ref 3.5–5.2)
Sodium: 142 mmol/L (ref 134–144)
eGFR: 69 mL/min/{1.73_m2} (ref >59)

## 2022-12-08 LAB — URINE CULTURE: Culture: 20000 — AB

## 2022-12-11 ENCOUNTER — Encounter (HOSPITAL_COMMUNITY): Payer: 59

## 2022-12-21 ENCOUNTER — Ambulatory Visit (HOSPITAL_COMMUNITY): Payer: 59 | Attending: Family Medicine

## 2022-12-21 DIAGNOSIS — M545 Low back pain, unspecified: Secondary | ICD-10-CM | POA: Insufficient documentation

## 2022-12-21 DIAGNOSIS — R2689 Other abnormalities of gait and mobility: Secondary | ICD-10-CM | POA: Diagnosis not present

## 2022-12-21 DIAGNOSIS — M6281 Muscle weakness (generalized): Secondary | ICD-10-CM | POA: Diagnosis not present

## 2022-12-21 DIAGNOSIS — R262 Difficulty in walking, not elsewhere classified: Secondary | ICD-10-CM | POA: Diagnosis not present

## 2022-12-21 NOTE — Therapy (Signed)
OUTPATIENT PHYSICAL THERAPY THORACOLUMBAR TREATMENT/PROGRESS NOTE Progress Note Reporting Period 09/20/2022 to 12/21/2022  See note below for Objective Data and Assessment of Progress/Goals.        Patient Name: Melinda Moore MRN: [id number] DOB:18-Dec-1970, 52 y.o., female Today's Date: 12/21/2022  END OF SESSION:  PT End of Session - 12/21/22 1119     Visit Number 12    Number of Visits 16    Date for PT Re-Evaluation 01/18/23    Authorization Type [employer]    Authorization - Number of Visits 60    PT Start Time 1120    PT Stop Time 1200    PT Time Calculation (min) 40 min    Activity Tolerance Patient limited by fatigue;Patient tolerated treatment well    Behavior During Therapy [facility] for tasks assessed/performed               Past Medical History:  Diagnosis Date   Anemia    Anxiety    Bell's palsy    Depression    DVT (deep venous thrombosis) (HCC) 09/02/2012   [place] on 02/13/2012 showed acute DVT in left calf veins and posterior tibial veins   Fibromyalgia    GERD (gastroesophageal reflux disease)    History of blood transfusion 2000   History of trigeminal neuralgia    HTN (hypertension)    patient denies was taking a blood pressure medication in early 2000 but it was migraines   Leukopenia    MS (multiple sclerosis) (HCC) 1997   Neuropathic pain    Peripheral edema    Port catheter in place 12/31/2012   Right bundle branch block    Past Surgical History:  Procedure Laterality Date   ABDOMINAL HYSTERECTOMY     CHOLECYSTECTOMY     COLONOSCOPY WITH PROPOFOL N/A 09/27/2021   Procedure: COLONOSCOPY WITH PROPOFOL;  Surgeon: Corbin Ade, MD;  Location: [facility];  Service: Endoscopy;  Laterality: N/A;  8:00am, asa 2   ESOPHAGOGASTRODUODENOSCOPY  2011   Dr. Jena Gauss. 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 Biopsy from the stomach revealed minimal chronic inactive  inflammation but negative for H. pylori   FLEXIBLE SIGMOIDOSCOPY N/A 02/08/2022   Procedure: FLEXIBLE SIGMOIDOSCOPY;  Surgeon: Corbin Ade, MD;  Location: [facility];  Service: Endoscopy;  Laterality: N/A;   LAPAROSCOPIC GASTRIC SLEEVE RESECTION N/A 04/17/2016   Procedure: LAPAROSCOPIC GASTRIC SLEEVE RESECTION WITH UPPER ENDOSCOPY;  Surgeon: Glenna Fellows, MD;  Location: [facility];  Service: General;  Laterality: N/A;   PATELLA-FEMORAL ARTHROPLASTY Right 07/14/2019   Procedure: PATELLA-FEMORAL ARTHROPLASTY;  Surgeon: Teryl Lucy, MD;  Location: [facility];  Service: Orthopedics;  Laterality: Right;   PORTACATH PLACEMENT     TUBAL LIGATION  2001   Patient Active Problem List   Diagnosis Date Noted   Chronic low back pain (1ry area of Pain) (Bilateral) (R>L) w/o sciatica 07/18/2022   Lumbar facet arthropathy (Bilateral) 07/18/2022   Lumbar facet joint pain 07/18/2022   Chronic lower extremity pain (2ry area of Pain) (Right) 07/18/2022   DDD (degenerative disc disease), lumbosacral 07/18/2022   Abnormal MRI, lumbar spine (10/11/2017) 07/18/2022   Lumbosacral foraminal stenosis (Left: L5-S1) 07/18/2022   Obesity, Class II, BMI 35-39.9 07/18/2022   Osteoarthritis of facet joint of lumbar spine 07/18/2022   Primary osteoarthritis of lumbar spine 07/18/2022   History of total knee replacement (Right) 07/18/2022   Chronic knee pain s/p TKR (Right) 07/18/2022   Chronic medial  knee pain (Right) 07/18/2022   Decreased range of motion of lumbar spine 07/18/2022   Pharmacologic therapy 07/15/2022   Disorder of skeletal system 07/15/2022   Problems influencing health status 07/15/2022   Gastroesophageal reflux disease 09/07/2021   Colon cancer screening 09/07/2021   Hx of hysterectomy 06/14/2021   Elevated transaminase level 02/04/2020   Patellofemoral arthritis of knee (Right) 07/14/2019   History of DVT in adulthood 06/23/2019   Gait abnormality 03/12/2017   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 disc disorder 09/21/2016   Spondylosis without myelopathy or radiculopathy, lumbar region 09/21/2016   History of anxiety 09/08/2016   Primary osteoarthritis of knee (Right) 08/30/2016   Primary insomnia 08/30/2016   Myofascial pain 08/30/2016   Sciatica, right side 12/12/2015   Morbid obesity (HCC) 08/12/2015   Chronic anxiety 07/08/2013   Swelling of breast 04/08/2013   Chronic pain syndrome 03/11/2013   Port catheter in place 12/31/2012   Constipation 10/01/2012   Anemia 10/01/2012   Rectal bleeding 10/01/2012   Elevated alkaline phosphatase level 10/01/2012   MS (multiple sclerosis) (HCC) 09/02/2012   Anxiety 11/29/2011   Headache(784.0) 11/29/2011   ANEMIA 04/12/2010   Other dysphagia 04/12/2010    PCP: Lilyan Punt, MD  REFERRING PROVIDER: Babs Sciara, MD  REFERRING DIAG: M54.9 (ICD-10-CM) - Back pain, unspecified back location, unspecified back pain laterality, unspecified chronicity  Rationale for Evaluation and Treatment: Rehabilitation  THERAPY DIAG:  Low back pain, unspecified back pain laterality, unspecified chronicity, unspecified whether sciatica present  Difficulty in walking, not elsewhere classified  Muscle weakness (generalized)  ONSET DATE: worse since 2017   SUBJECTIVE:                                                                                                                                                                                           SUBJECTIVE STATEMENT: Stand a little longer with less pain; still really stiff;  12/31/22 having an injection; patient has not been about to get into therapy before now.  Would like to continue with therapy to get stronger.    Eval: Diagnosis with MS years ago; sees neurologist in [facility] ; has generally done well but in 2017 started having more issues; after TKA and COVD " I just haven't bounced back"; reports stiffness, back pain and leg pain; MS has affected Right side more.   Pt accompanied by: self   PERTINENT HISTORY:   seeing pain management at [facility], Dr. Jola Schmidt for injections LBP;  TKA 2021 R ; then COVD shortly at the end of 2021  x 2 week in the hospital Also had some injections in the  back per Dr. Murray Hodgkins that did help some    PAIN:  Are you having pain? Yes: NPRS scale: 0/10 Pain location: low back and right leg Pain description: throbbing and nagging aching; constant Aggravating factors: activity Relieving factors: rest, injections  PRECAUTIONS: Fall  WEIGHT BEARING RESTRICTIONS: No  FALLS:  Has patient fallen in last 6 months? No  OCCUPATION: not working  PLOF: Independent with basic ADLs  PATIENT GOALS: get off some of these pain meds  NEXT MD VISIT: PCP [place] end of July;  12/04/22 for steroid injection  OBJECTIVE:   DIAGNOSTIC FINDINGS:  CLINICAL DATA:  Low back pain. Chronic bilateral low back pain without sciatica. Lumbar facet arthropathy. Lumbar facet joint pain. Degenerative disc disease.   EXAM: LUMBAR SPINE - COMPLETE WITH BENDING VIEWS   COMPARISON:  Lumbar MRI 10/11/2017   FINDINGS: AP, bilateral oblique, lateral, lateral flexion, lateral extension and lateral L5-S1 spot views obtained. 5 non-rib-bearing lumbar vertebra with tiny ribs at L1 as before.   There is 4-5 mm of anterolisthesis of L4 on L5 present on flexion and extension, this is 0 mm on neutral imaging. Alignment is otherwise normal. Vertebral body heights are normal. There is mild L3-L4 and L4-L5 disc space narrowing and spurring. Mild L3-L4, moderate at L4-L5 and mild L5-S1 facet hypertrophy. No evidence of fracture focal bone abnormalities. The sacroiliac joints are congruent.   IMPRESSION: 1. Degenerative disc disease and facet arthropathy at L3-L4 and L4-L5. 2. Grade 1 anterolisthesis of L4 on L5 measuring 4-5 mm on flexion and extension, 0 mm on neutral imaging.     Electronically Signed   By: Narda Rutherford M.D.   On:  07/20/2022 15:18    PATIENT SURVEYS:  FOTO 52  COGNITION: Overall cognitive status: Within functional limits for tasks assessed     SENSATION: Neuropathy both fee  POSTURE: rounded shoulders, forward head, and flexed trunk   PALPATION:   LUMBAR ROM:   AROM eval 12/21/22  Flexion 60% available   Extension Unable to get to neutral   Right lateral flexion    Left lateral flexion    Right rotation    Left rotation     (Blank rows = not tested)   LOWER EXTREMITY MMT:    MMT Right eval Left eval Right 10/18/22 Left 10/18/22 Right 11/16/22 Left 11/16/22 Right 12/21/22 Left 12/21/22  Hip flexion 4 4 4+ 4+ 4+ 4+ 4+ 4+  Hip extension 2 2   3- 3 3- 3-  Hip abduction          Hip adduction          Hip internal rotation          Hip external rotation          Knee flexion 3- 3-   3+ 3+ 4 (limited knee range) 4 (Limited knee range)  Knee extension 4 4 4 5 5 5 5 5   Ankle dorsiflexion 4+ 4+ 5 5 5 5 5 5   Ankle plantarflexion          Ankle inversion          Ankle eversion           (Blank rows = not tested)   FUNCTIONAL TESTS:  5 times sit to stand: 18.18 sec using arm to push up on thighs.  2 minute walk test: 224 ft (had to stop before time was up; about 36 sec less)  GAIT: Distance walked: 224 ft Assistive device utilized: None Level of  assistance: Modified independence Comments: forward flexed trunk; increased back pain  TODAY'S TREATMENT:                                                                                                                              DATE: 12/21/22 Progress note FOTO 57 Nustep x 5' seat 10 for dynamic warm up 5 times sit to stand  14.05 sec no UE assist 2 MWT 225 ft MMT's see above  Prone knee flexion stretch 5 x 20" each   11/26/22 Standing: Lumbar extension x 15 Squats to chair x 10 Heel raises on incline 2 x 10 Incline board 5 x 20" Wall bilateral UE flexion x 10  Sitting on ball pelvic clock 12 to 6; anterior and posterior  rotation 2 x 10  Tandem stance 3 x 10" each  Sitting Thoracic extensions x 10 Thoracic extension with rotation x 10 GTB shoulder horizontal abduction 2 x 10 Piriformis stretch x 20" each side   11/20/22 Standing:  Squat front of chair Lumbar rotation Weight shifting with UE overhead Rotate Lumbar extension front of bar Vector stance 5x 5" with UE support Tandem stance 2x 30"  Seated piriformis stretch 2x 30" Seated pelvic rotation 10x  Supine:  Bridge 10x 5"  11/16/22 Progress note FOTO 55 Nustep x 5' dynamic warm up seat 10 5 x sit to stand 14.32 sec no UE assist 2 MWT 264 ft (has to stop and rest briefly) MMT's see above   11/13/22 Standing: Heel raises on incline x 20 Slant board 5 x 20" Tandem stance with bow and arrow green theraband 2 x 10 each Hip abduction 2# 2 x 10 Hip extension 2# 2 x 10 Marching alternating 2# 2X10 Wall arches 2 x 10 Wall push ups 2 x 10 UE flexion against wall 2X10 Sit to stand x 10 no UE assist  10/18/22 Progress note 5 x sit to stand 14.26 sec no UE assist FOTO 57 MMT's see above  Standing: Heel raises on incline x 20 Slant board 5 x 20" Tandem stance with bow and arrow green theraband 2 x 10 each Wall arches 2 x 10 Wall push ups 2 x 10 Doorway stretch 5 x 10" Hip abduction 2# 2 x 10 Hip extension 2# 2 x 10 Sit to stand x 10 no UE assist Seated: Shoulder horizontal abduction GTB 2 x 10 Thoracic extension over chair x 20  10/16/22 Standing: Heel raises x 10 Slant board 3 x 20" Hip vectors 2" hold; 2 x 5 each  GTB shoulder rows and ext 2 x 10 each Lumbar extension x 10  Sitting: GTB shoulder abduction 2 x 10 Thoracic extension over chair 2 x 10  Sit to stand 3 x 5 no UE assist    10/09/22:  GTB  shoulder extension 10x  Rows 10x  3D hip excursion (Squat then extension 10x front of chair for mechanics, lateral weight shifting with UE reach, rotation)  Hip flexor stance on 12in step 3x30"  Wall arch 10x     10/02/22: Prone press up 10x 10"  Standing: Squat then extension 10x front of chair for mechanics RTB shoulder extension 10x  Rows 10x   Hip flexor stance on 12in step 3x20"  STS 10x eccentric control  09/28/22:  Sidelying abduction 10x Supine: Bridge 2sets 10x Thomas stretch on edge of mat 2 x 30" towel assistance Prone press up 10x 10" Rest break infront of fan to address fatigue STS 5x Standing lumbar extension  09/26/22: Reviewed goals, discussed benefits with HEP compliance for maximal benefits. 2 sets x 5 reps STS with eccentric control Standing lumbar extension Prone press up 10x 30" holds Glut sets 10x 5" Attempted knee bent pushing foot towards ceiling- too difficult Supine:  Bridge 10x 5" eccentric lowering Marching 10x5 " with ab set Sidelying: clam    09/20/22 physical therapy evaluation and HEP instruction    PATIENT EDUCATION:  Education details: Patient educated on exam findings, POC, scope of PT, HEP, and what to expect next visit. Person educated: Patient Education method: Explanation, Demonstration, and Handouts Education comprehension: verbalized understanding, returned demonstration, verbal cues required, and tactile cues required  HOME EXERCISE PROGRAM: 12/21/22 knee flexion with strap 10/18/22 shoulder habduction with Tband, bow and arrow and sitting thoracic extension Access Code: [email or web address] URL: [email or web address] Date: 09/20/2022 Prepared by: [facility]  Exercises - Static Prone on Elbows  - 2 x daily - 7 x weekly - 1 sets - 1 reps - 3 min hold - Prone Press Up  - 2 x daily - 7 x weekly - 1 sets - 5 reps - Prone Gluteal Sets  - 2 x daily - 7 x weekly - 1 sets - 10 reps - 5" hold - Supine Bridge  - 2 x daily - 7 x weekly - 3 sets - 10 reps  Access Code: [email or web address] URL: [email or web address] Date: 09/28/2022 Prepared by: Becky Sax  Exercises - Static Prone on Elbows  - 2 x daily - 7 x weekly - 1 sets  - 1 reps - 3 min hold - Prone Press Up  - 2 x daily - 7 x weekly - 1 sets - 5 reps - Prone Gluteal Sets  - 2 x daily - 7 x weekly - 1 sets - 10 reps - 5" hold - Supine Bridge  - 2 x daily - 7 x weekly - 3 sets - 10 reps - Supine Lower Trunk Rotation  - 2 x daily - 7 x weekly - 1 sets - 10 reps - Supine Transversus Abdominis Bracing - Hands on Stomach  - 2 x daily - 7 x weekly - 1 sets - 10 reps - Prone Hip Extension  - 1 x daily - 7 x weekly - 1-2 sets - 10 reps- when ready - Clam  - 1 x daily - 7 x weekly - 3 sets - 10 reps - 5" hold -Supine Thomas stretch 3x 30" with towel assistance -ASSESSMENT:  CLINICAL IMPRESSION:  Progress note today.  Patient has not attended therapy since 11/26/22 so needs progress note and update on status.  FOTO same; slight improvement with 5 times sit to stand test.   Patient will benefit from continued skilled therapy services to address remaining unmet and partially met goals.   Eval: Patient is a 52 y.o. female who was seen today for physical therapy evaluation and treatment for low back pain that radiates down right leg.  Patient  presents to physical therapy with complaint of back pain and leg and back weakness. Patient demonstrates muscle weakness, reduced ROM, and fascial restrictions which are likely contributing to symptoms of pain and are negatively impacting patient ability to perform ADLs and functional mobility tasks. Patient will benefit from skilled physical therapy services to address these deficits to reduce pain and improve level of function with ADLs and functional mobility tasks.    OBJECTIVE IMPAIRMENTS: Abnormal gait, decreased activity tolerance, decreased mobility, difficulty walking, decreased ROM, decreased strength, hypomobility, increased fascial restrictions, impaired perceived functional ability, postural dysfunction, and pain.   ACTIVITY LIMITATIONS: carrying, lifting, bending, sitting, standing, squatting, sleeping, stairs, transfers,  locomotion level, and caring for others  PARTICIPATION LIMITATIONS: meal prep, cleaning, laundry, shopping, community activity, and yard work  PERSONAL FACTORS: 1 comorbidity: MS  are also affecting patient's functional outcome.   REHAB POTENTIAL: Good  CLINICAL DECISION MAKING: Stable/uncomplicated  EVALUATION COMPLEXITY: Low   GOALS: Goals reviewed with patient? No  SHORT TERM GOALS: Target date: 10/04/2022  patient will be independent with initial HEP   Baseline: Goal status: MET  2.  Patient will self report 30% improvement to improve tolerance for functional activity  Baseline:  Goal status: MET    LONG TERM GOALS: Target date: 11/16/2022  Patient will be independent in self management strategies to improve quality of life and functional outcomes.   Baseline:  Goal status: IN PROGRESS  2.  Patient will self report 50% improvement to improve tolerance for functional activity  Baseline:  Goal status: MET  3.  Patient will improve FOTO score to predicted value (61)   Baseline: 52; 57 10/18/22; 12/21/22 57 Goal status: IN PROGRESS  4.  Patient will increase distance on to 275 ft  to demonstrate improved functional mobility walking household and community distances.   Baseline: 224 ft; 11/16/22 268 ft  Goal status: IN PROGRESS  5.  Patient will increase  leg MMTs to 4-5/5 without pain to promote return to ambulation community distances with minimal deviation.  Baseline: see above Goal status: IN PROGRESS  PLAN:  PT FREQUENCY: 1x/week  PT DURATION: 4 weeks  PLANNED INTERVENTIONS: Therapeutic exercises, Therapeutic activity, Neuromuscular re-education, Balance training, Gait training, Patient/Family education, Joint manipulation, Joint mobilization, Stair training, Orthotic/Fit training, DME instructions, Aquatic Therapy, Dry Needling, Electrical stimulation, Spinal manipulation, Spinal mobilization, Cryotherapy, Moist heat, Compression bandaging, scar  mobilization, Splintting, Taping, Traction, Ultrasound, Ionotophoresis 4mg /ml Dexamethasone, and Manual therapy .  PLAN FOR NEXT SESSION:  extend 1 x a week x 4 weeks to address remaining unmet and partially met goals  12:00 PM, 12/21/22 Melinda Moore Melinda Moore MPT [facility] [place] [phone]

## 2022-12-27 ENCOUNTER — Telehealth (HOSPITAL_COMMUNITY): Payer: Self-pay

## 2022-12-27 ENCOUNTER — Ambulatory Visit (HOSPITAL_COMMUNITY): Payer: 59

## 2022-12-27 NOTE — Telephone Encounter (Signed)
No show, called and left message concerning missed apt today. Reminded next apt date and time with contact number included if needs to reschedule/cancel future apts.  Becky Sax, LPTA/CLT; [place] [phone]

## 2022-12-31 DIAGNOSIS — M5416 Radiculopathy, lumbar region: Secondary | ICD-10-CM | POA: Diagnosis not present

## 2023-01-02 ENCOUNTER — Encounter (HOSPITAL_COMMUNITY): Payer: Self-pay

## 2023-01-02 ENCOUNTER — Ambulatory Visit (HOSPITAL_COMMUNITY): Payer: 59

## 2023-01-02 DIAGNOSIS — M6281 Muscle weakness (generalized): Secondary | ICD-10-CM | POA: Diagnosis not present

## 2023-01-02 DIAGNOSIS — M545 Low back pain, unspecified: Secondary | ICD-10-CM | POA: Diagnosis not present

## 2023-01-02 DIAGNOSIS — R262 Difficulty in walking, not elsewhere classified: Secondary | ICD-10-CM

## 2023-01-02 DIAGNOSIS — R2689 Other abnormalities of gait and mobility: Secondary | ICD-10-CM

## 2023-01-02 NOTE — Therapy (Signed)
OUTPATIENT PHYSICAL THERAPY THORACOLUMBAR TREATMENT   Patient Name: Melinda Moore MRN: [id number] DOB:[birth year], 52 y.o., female Today's Date: 01/02/2023  END OF SESSION:  PT End of Session - 01/02/23 0948     Visit Number 13    Number of Visits 16    Date for PT Re-Evaluation 01/18/23    Authorization Type [employer]    Authorization - Number of Visits 60    PT Start Time 0955    PT Stop Time 1035    PT Time Calculation (min) 40 min    Activity Tolerance Patient tolerated treatment well    Behavior During Therapy WFL for tasks assessed/performed               Past Medical History:  Diagnosis Date   Anemia    Anxiety    Bell's palsy    Depression    DVT (deep venous thrombosis) (HCC) 09/02/2012   [place] on 02/13/2012 showed acute DVT in left calf veins and posterior tibial veins   Fibromyalgia    GERD (gastroesophageal reflux disease)    History of blood transfusion 2000   History of trigeminal neuralgia    HTN (hypertension)    patient denies was taking a blood pressure medication in early 2000 but it was migraines   Leukopenia    MS (multiple sclerosis) (HCC) 1997   Neuropathic pain    Peripheral edema    Port catheter in place 12/31/2012   Right bundle branch block    Past Surgical History:  Procedure Laterality Date   ABDOMINAL HYSTERECTOMY     CHOLECYSTECTOMY     COLONOSCOPY WITH PROPOFOL N/A 09/27/2021   Procedure: COLONOSCOPY WITH PROPOFOL;  Surgeon: Corbin Ade, MD;  Location: [facility];  Service: Endoscopy;  Laterality: N/A;  8:00am, asa 2   ESOPHAGOGASTRODUODENOSCOPY  2011   Dr. Jena Gauss. 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 Biopsy from the stomach revealed minimal chronic inactive inflammation but negative for H. pylori   FLEXIBLE SIGMOIDOSCOPY N/A 02/08/2022   Procedure: FLEXIBLE SIGMOIDOSCOPY;  Surgeon: Corbin Ade, MD;  Location: [facility];  Service:  Endoscopy;  Laterality: N/A;   LAPAROSCOPIC GASTRIC SLEEVE RESECTION N/A 04/17/2016   Procedure: LAPAROSCOPIC GASTRIC SLEEVE RESECTION WITH UPPER ENDOSCOPY;  Surgeon: Glenna Fellows, MD;  Location: [facility];  Service: General;  Laterality: N/A;   PATELLA-FEMORAL ARTHROPLASTY Right 07/14/2019   Procedure: PATELLA-FEMORAL ARTHROPLASTY;  Surgeon: Teryl Lucy, MD;  Location: [facility];  Service: Orthopedics;  Laterality: Right;   PORTACATH PLACEMENT     TUBAL LIGATION  2001   Patient Active Problem List   Diagnosis Date Noted   Chronic low back pain (1ry area of Pain) (Bilateral) (R>L) w/o sciatica 07/18/2022   Lumbar facet arthropathy (Bilateral) 07/18/2022   Lumbar facet joint pain 07/18/2022   Chronic lower extremity pain (2ry area of Pain) (Right) 07/18/2022   DDD (degenerative disc disease), lumbosacral 07/18/2022   Abnormal MRI, lumbar spine (10/11/2017) 07/18/2022   Lumbosacral foraminal stenosis (Left: L5-S1) 07/18/2022   Obesity, Class II, BMI 35-39.9 07/18/2022   Osteoarthritis of facet joint of lumbar spine 07/18/2022   Primary osteoarthritis of lumbar spine 07/18/2022   History of total knee replacement (Right) 07/18/2022   Chronic knee pain s/p TKR (Right) 07/18/2022   Chronic medial knee pain (Right) 07/18/2022   Decreased range of motion of lumbar spine 07/18/2022   Pharmacologic therapy 07/15/2022   Disorder of skeletal system 07/15/2022  Problems influencing health status 07/15/2022   Gastroesophageal reflux disease 09/07/2021   Colon cancer screening 09/07/2021   Hx of hysterectomy 06/14/2021   Elevated transaminase level 02/04/2020   Patellofemoral arthritis of knee (Right) 07/14/2019   History of DVT in adulthood 06/23/2019   Gait abnormality 03/12/2017   Radiculopathy due to lumbar intervertebral disc disorder 09/21/2016   Spondylosis without myelopathy or radiculopathy, lumbar region 09/21/2016   History of anxiety 09/08/2016   Primary osteoarthritis of knee  (Right) 08/30/2016   Primary insomnia 08/30/2016   Myofascial pain 08/30/2016   Sciatica, right side 12/12/2015   Morbid obesity (HCC) 08/12/2015   Chronic anxiety 07/08/2013   Swelling of breast 04/08/2013   Chronic pain syndrome 03/11/2013   Port catheter in place 12/31/2012   Constipation 10/01/2012   Anemia 10/01/2012   Rectal bleeding 10/01/2012   Elevated alkaline phosphatase level 10/01/2012   MS (multiple sclerosis) (HCC) 09/02/2012   Anxiety 11/29/2011   Headache(784.0) 11/29/2011   ANEMIA 04/12/2010   Other dysphagia 04/12/2010    PCP: Lilyan Punt, MD  REFERRING PROVIDER: Babs Sciara, MD  REFERRING DIAG: M54.9 (ICD-10-CM) - Back pain, unspecified back location, unspecified back pain laterality, unspecified chronicity  Rationale for Evaluation and Treatment: Rehabilitation  THERAPY DIAG:  Low back pain, unspecified back pain laterality, unspecified chronicity, unspecified whether sciatica present  Difficulty in walking, not elsewhere classified  Muscle weakness (generalized)  Other abnormalities of gait and mobility  ONSET DATE: worse since 2017   SUBJECTIVE:                                                                                                                                                                                           SUBJECTIVE STATEMENT: 01/02/23:  Reports she is feeling good today.  Reports she had an injection in back, stated it has helped with pain ability to clean house with less rest breaks.  Stated her fatigue levels are good today.  Eval: Diagnosis with MS years ago; sees neurologist in [facility] ; has generally done well but in 2017 started having more issues; after TKA and COVD " I just haven't bounced back"; reports stiffness, back pain and leg pain; MS has affected Right side more.  Pt accompanied by: self   PERTINENT HISTORY:   seeing pain management at [facility], Dr. Jola Schmidt for injections LBP;  TKA 2021 R ;  then COVD shortly at the end of 2021  x 2 week in the hospital Also had some injections in the back per Dr. Murray Hodgkins that did help some    PAIN:  Are you having pain? Yes: NPRS scale: 0/10  Pain location: low back and right leg Pain description: throbbing and nagging aching; constant Aggravating factors: activity Relieving factors: rest, injections  PRECAUTIONS: Fall  WEIGHT BEARING RESTRICTIONS: No  FALLS:  Has patient fallen in last 6 months? No  OCCUPATION: not working  PLOF: Independent with basic ADLs  PATIENT GOALS: get off some of these pain meds  NEXT MD VISIT: PCP [place] end of July;  12/04/22 for steroid injection  OBJECTIVE:   DIAGNOSTIC FINDINGS:  CLINICAL DATA:  Low back pain. Chronic bilateral low back pain without sciatica. Lumbar facet arthropathy. Lumbar facet joint pain. Degenerative disc disease.   EXAM: LUMBAR SPINE - COMPLETE WITH BENDING VIEWS   COMPARISON:  Lumbar MRI 10/11/2017   FINDINGS: AP, bilateral oblique, lateral, lateral flexion, lateral extension and lateral L5-S1 spot views obtained. 5 non-rib-bearing lumbar vertebra with tiny ribs at L1 as before.   There is 4-5 mm of anterolisthesis of L4 on L5 present on flexion and extension, this is 0 mm on neutral imaging. Alignment is otherwise normal. Vertebral body heights are normal. There is mild L3-L4 and L4-L5 disc space narrowing and spurring. Mild L3-L4, moderate at L4-L5 and mild L5-S1 facet hypertrophy. No evidence of fracture focal bone abnormalities. The sacroiliac joints are congruent.   IMPRESSION: 1. Degenerative disc disease and facet arthropathy at L3-L4 and L4-L5. 2. Grade 1 anterolisthesis of L4 on L5 measuring 4-5 mm on flexion and extension, 0 mm on neutral imaging.     Electronically Signed   By: Narda Rutherford M.D.   On: 07/20/2022 15:18    PATIENT SURVEYS:  FOTO 52  COGNITION: Overall cognitive status: Within functional limits for tasks  assessed     SENSATION: Neuropathy both fee  POSTURE: rounded shoulders, forward head, and flexed trunk   PALPATION:   LUMBAR ROM:   AROM eval 12/21/22  Flexion 60% available   Extension Unable to get to neutral   Right lateral flexion    Left lateral flexion    Right rotation    Left rotation     (Blank rows = not tested)   LOWER EXTREMITY MMT:    MMT Right eval Left eval Right 10/18/22 Left 10/18/22 Right 11/16/22 Left 11/16/22 Right 12/21/22 Left 12/21/22  Hip flexion 4 4 4+ 4+ 4+ 4+ 4+ 4+  Hip extension 2 2   3- 3 3- 3-  Hip abduction          Hip adduction          Hip internal rotation          Hip external rotation          Knee flexion 3- 3-   3+ 3+ 4 (limited knee range) 4 (Limited knee range)  Knee extension 4 4 4 5 5 5 5 5   Ankle dorsiflexion 4+ 4+ 5 5 5 5 5 5   Ankle plantarflexion          Ankle inversion          Ankle eversion           (Blank rows = not tested)   FUNCTIONAL TESTS:  5 times sit to stand: 18.18 sec using arm to push up on thighs.  2 minute walk test: 224 ft (had to stop before time was up; about 36 sec less)  GAIT: Distance walked: 224 ft Assistive device utilized: None Level of assistance: Modified independence Comments: forward flexed trunk; increased back pain  TODAY'S TREATMENT:  DATE: 01/02/23: STS 10x  Standing: Squat 10x 2 front of chair Vector stance 3x 5" BLE with HHA Wall arch 10x Wall bilateral UE flexion x 10 Standing hip flexor stretch 12in step height 2x 30" Tandem stance 2x 30" Lumbar extension 15x Prone: hip extension 10x  Press up 5 Supine:  Bridge 10x   12/21/22 Progress note FOTO 57 Nustep x 5' seat 10 for dynamic warm up 5 times sit to stand  14.05 sec no UE assist 2 MWT 225 ft MMT's see above  Prone knee flexion stretch 5 x 20" each   11/26/22 Standing: Lumbar extension  x 15 Squats to chair x 10 Heel raises on incline 2 x 10 Incline board 5 x 20" Wall bilateral UE flexion x 10  Sitting on ball pelvic clock 12 to 6; anterior and posterior rotation 2 x 10  Tandem stance 3 x 10" each  Sitting Thoracic extensions x 10 Thoracic extension with rotation x 10 GTB shoulder horizontal abduction 2 x 10 Piriformis stretch x 20" each side   11/20/22 Standing:  Squat front of chair Lumbar rotation Weight shifting with UE overhead Rotate Lumbar extension front of bar Vector stance 5x 5" with UE support Tandem stance 2x 30"  Seated piriformis stretch 2x 30" Seated pelvic rotation 10x  Supine:  Bridge 10x 5"  11/16/22 Progress note FOTO 55 Nustep x 5' dynamic warm up seat 10 5 x sit to stand 14.32 sec no UE assist 2 MWT 264 ft (has to stop and rest briefly) MMT's see above   11/13/22 Standing: Heel raises on incline x 20 Slant board 5 x 20" Tandem stance with bow and arrow green theraband 2 x 10 each Hip abduction 2# 2 x 10 Hip extension 2# 2 x 10 Marching alternating 2# 2X10 Wall arches 2 x 10 Wall push ups 2 x 10 UE flexion against wall 2X10 Sit to stand x 10 no UE assist  10/18/22 Progress note 5 x sit to stand 14.26 sec no UE assist FOTO 57 MMT's see above  Standing: Heel raises on incline x 20 Slant board 5 x 20" Tandem stance with bow and arrow green theraband 2 x 10 each Wall arches 2 x 10 Wall push ups 2 x 10 Doorway stretch 5 x 10" Hip abduction 2# 2 x 10 Hip extension 2# 2 x 10 Sit to stand x 10 no UE assist Seated: Shoulder horizontal abduction GTB 2 x 10 Thoracic extension over chair x 20  10/16/22 Standing: Heel raises x 10 Slant board 3 x 20" Hip vectors 2" hold; 2 x 5 each  GTB shoulder rows and ext 2 x 10 each Lumbar extension x 10  Sitting: GTB shoulder abduction 2 x 10 Thoracic extension over chair 2 x 10  Sit to stand 3 x 5 no UE assist    10/09/22:  GTB  shoulder extension 10x  Rows 10x  3D hip  excursion (Squat then extension 10x front of chair for mechanics, lateral weight shifting with UE reach, rotation)  Hip flexor stance on 12in step 3x30"  Wall arch 10x    10/02/22: Prone press up 10x 10"  Standing: Squat then extension 10x front of chair for mechanics RTB shoulder extension 10x  Rows 10x   Hip flexor stance on 12in step 3x20"  STS 10x eccentric control  09/28/22:  Sidelying abduction 10x Supine: Bridge 2sets 10x Thomas stretch on edge of mat 2 x 30" towel assistance Prone press up 10x 10" Rest break  infront of fan to address fatigue STS 5x Standing lumbar extension  09/26/22: Reviewed goals, discussed benefits with HEP compliance for maximal benefits. 2 sets x 5 reps STS with eccentric control Standing lumbar extension Prone press up 10x 30" holds Glut sets 10x 5" Attempted knee bent pushing foot towards ceiling- too difficult Supine:  Bridge 10x 5" eccentric lowering Marching 10x5 " with ab set Sidelying: clam    09/20/22 physical therapy evaluation and HEP instruction    PATIENT EDUCATION:  Education details: Patient educated on exam findings, POC, scope of PT, HEP, and what to expect next visit. Person educated: Patient Education method: Explanation, Demonstration, and Handouts Education comprehension: verbalized understanding, returned demonstration, verbal cues required, and tactile cues required  HOME EXERCISE PROGRAM: 12/21/22 knee flexion with strap 10/18/22 shoulder habduction with Tband, bow and arrow and sitting thoracic extension Access Code: [email or web address] URL: [email or web address] Date: 09/20/2022 Prepared by: [facility]  Exercises - Static Prone on Elbows  - 2 x daily - 7 x weekly - 1 sets - 1 reps - 3 min hold - Prone Press Up  - 2 x daily - 7 x weekly - 1 sets - 5 reps - Prone Gluteal Sets  - 2 x daily - 7 x weekly - 1 sets - 10 reps - 5" hold - Supine Bridge  - 2 x daily - 7 x weekly - 3 sets - 10 reps  Access Code:  [email or web address] URL: [email or web address] Date: 09/28/2022 Prepared by: Becky Sax  Exercises - Static Prone on Elbows  - 2 x daily - 7 x weekly - 1 sets - 1 reps - 3 min hold - Prone Press Up  - 2 x daily - 7 x weekly - 1 sets - 5 reps - Prone Gluteal Sets  - 2 x daily - 7 x weekly - 1 sets - 10 reps - 5" hold - Supine Bridge  - 2 x daily - 7 x weekly - 3 sets - 10 reps - Supine Lower Trunk Rotation  - 2 x daily - 7 x weekly - 1 sets - 10 reps - Supine Transversus Abdominis Bracing - Hands on Stomach  - 2 x daily - 7 x weekly - 1 sets - 10 reps - Prone Hip Extension  - 1 x daily - 7 x weekly - 1-2 sets - 10 reps- when ready - Clam  - 1 x daily - 7 x weekly - 3 sets - 10 reps - 5" hold -Supine Thomas stretch 3x 30" with towel assistance -ASSESSMENT:  CLINICAL IMPRESSION: Session focus with proximal strengthening, lumbar mobility and balance.  Pt continues to present with tight hip flexors and decreased core and gluteal strength.  Verbal and tactile cueing to improve abdominal activation to reduce lordatic curvature for lumbar support during standing exercises.  Limited range with prone hip extension due to weakness.  No reports of pain through session and fatigue level stayed low.   Eval: Patient is a 52 y.o. female who was seen today for physical therapy evaluation and treatment for low back pain that radiates down right leg.  Patient  presents to physical therapy with complaint of back pain and leg and back weakness. Patient demonstrates muscle weakness, reduced ROM, and fascial restrictions which are likely contributing to symptoms of pain and are negatively impacting patient ability to perform ADLs and functional mobility tasks. Patient will benefit from skilled physical therapy services to address these deficits to reduce pain and improve level of function with ADLs  and functional mobility tasks.    OBJECTIVE IMPAIRMENTS: Abnormal gait, decreased activity tolerance, decreased  mobility, difficulty walking, decreased ROM, decreased strength, hypomobility, increased fascial restrictions, impaired perceived functional ability, postural dysfunction, and pain.   ACTIVITY LIMITATIONS: carrying, lifting, bending, sitting, standing, squatting, sleeping, stairs, transfers, locomotion level, and caring for others  PARTICIPATION LIMITATIONS: meal prep, cleaning, laundry, shopping, community activity, and yard work  PERSONAL FACTORS: 1 comorbidity: MS  are also affecting patient's functional outcome.   REHAB POTENTIAL: Good  CLINICAL DECISION MAKING: Stable/uncomplicated  EVALUATION COMPLEXITY: Low   GOALS: Goals reviewed with patient? No  SHORT TERM GOALS: Target date: 10/04/2022  patient will be independent with initial HEP   Baseline: Goal status: MET  2.  Patient will self report 30% improvement to improve tolerance for functional activity  Baseline:  Goal status: MET    LONG TERM GOALS: Target date: 11/16/2022  Patient will be independent in self management strategies to improve quality of life and functional outcomes.   Baseline:  Goal status: IN PROGRESS  2.  Patient will self report 50% improvement to improve tolerance for functional activity  Baseline:  Goal status: MET  3.  Patient will improve FOTO score to predicted value (61)   Baseline: 52; 57 10/18/22; 12/21/22 57 Goal status: IN PROGRESS  4.  Patient will increase distance on to 275 ft  to demonstrate improved functional mobility walking household and community distances.   Baseline: 224 ft; 11/16/22 268 ft  Goal status: IN PROGRESS  5.  Patient will increase  leg MMTs to 4-5/5 without pain to promote return to ambulation community distances with minimal deviation.  Baseline: see above Goal status: IN PROGRESS  PLAN:  PT FREQUENCY: 1x/week  PT DURATION: 4 weeks  PLANNED INTERVENTIONS: Therapeutic exercises, Therapeutic activity, Neuromuscular re-education, Balance  training, Gait training, Patient/Family education, Joint manipulation, Joint mobilization, Stair training, Orthotic/Fit training, DME instructions, Aquatic Therapy, Dry Needling, Electrical stimulation, Spinal manipulation, Spinal mobilization, Cryotherapy, Moist heat, Compression bandaging, scar mobilization, Splintting, Taping, Traction, Ultrasound, Ionotophoresis 4mg /ml Dexamethasone, and Manual therapy .  PLAN FOR NEXT SESSION:  extend 1 x a week x 4 weeks to address remaining unmet and partially met goals  11:07 AM, 01/02/23

## 2023-01-08 ENCOUNTER — Ambulatory Visit (HOSPITAL_COMMUNITY): Payer: 59

## 2023-01-08 DIAGNOSIS — M545 Low back pain, unspecified: Secondary | ICD-10-CM

## 2023-01-08 DIAGNOSIS — R2689 Other abnormalities of gait and mobility: Secondary | ICD-10-CM

## 2023-01-08 DIAGNOSIS — M6281 Muscle weakness (generalized): Secondary | ICD-10-CM | POA: Diagnosis not present

## 2023-01-08 DIAGNOSIS — R262 Difficulty in walking, not elsewhere classified: Secondary | ICD-10-CM

## 2023-01-08 NOTE — Therapy (Signed)
OUTPATIENT PHYSICAL THERAPY THORACOLUMBAR TREATMENT   Patient Name: ANSHIKA RAFFENSPERGER MRN: [id number] DOB:10/04/1970, 52 y.o., female Today's Date: 01/08/2023  END OF SESSION:  PT End of Session - 01/08/23 1254     Visit Number 14    Number of Visits 16    Date for PT Re-Evaluation 01/18/23    Authorization Type [employer]    Authorization - Number of Visits 60    PT Start Time 1300    PT Stop Time 1340    PT Time Calculation (min) 40 min    Activity Tolerance Patient tolerated treatment well    Behavior During Therapy WFL for tasks assessed/performed               Past Medical History:  Diagnosis Date   Anemia    Anxiety    Bell's palsy    Depression    DVT (deep venous thrombosis) (HCC) 09/02/2012   [place] on 02/13/2012 showed acute DVT in left calf veins and posterior tibial veins   Fibromyalgia    GERD (gastroesophageal reflux disease)    History of blood transfusion 2000   History of trigeminal neuralgia    HTN (hypertension)    patient denies was taking a blood pressure medication in early 2000 but it was migraines   Leukopenia    MS (multiple sclerosis) (HCC) 1997   Neuropathic pain    Peripheral edema    Port catheter in place 12/31/2012   Right bundle branch block    Past Surgical History:  Procedure Laterality Date   ABDOMINAL HYSTERECTOMY     CHOLECYSTECTOMY     COLONOSCOPY WITH PROPOFOL N/A 09/27/2021   Procedure: COLONOSCOPY WITH PROPOFOL;  Surgeon: Corbin Ade, MD;  Location: [facility];  Service: Endoscopy;  Laterality: N/A;  8:00am, asa 2   ESOPHAGOGASTRODUODENOSCOPY  2011   Dr. Jena Gauss. 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 Biopsy from the stomach revealed minimal chronic inactive inflammation but negative for H. pylori   FLEXIBLE SIGMOIDOSCOPY N/A 02/08/2022   Procedure: FLEXIBLE SIGMOIDOSCOPY;  Surgeon: Corbin Ade, MD;  Location: [facility];  Service:  Endoscopy;  Laterality: N/A;   LAPAROSCOPIC GASTRIC SLEEVE RESECTION N/A 04/17/2016   Procedure: LAPAROSCOPIC GASTRIC SLEEVE RESECTION WITH UPPER ENDOSCOPY;  Surgeon: Glenna Fellows, MD;  Location: [facility];  Service: General;  Laterality: N/A;   PATELLA-FEMORAL ARTHROPLASTY Right 07/14/2019   Procedure: PATELLA-FEMORAL ARTHROPLASTY;  Surgeon: Teryl Lucy, MD;  Location: [facility];  Service: Orthopedics;  Laterality: Right;   PORTACATH PLACEMENT     TUBAL LIGATION  2001   Patient Active Problem List   Diagnosis Date Noted   Chronic low back pain (1ry area of Pain) (Bilateral) (R>L) w/o sciatica 07/18/2022   Lumbar facet arthropathy (Bilateral) 07/18/2022   Lumbar facet joint pain 07/18/2022   Chronic lower extremity pain (2ry area of Pain) (Right) 07/18/2022   DDD (degenerative disc disease), lumbosacral 07/18/2022   Abnormal MRI, lumbar spine (10/11/2017) 07/18/2022   Lumbosacral foraminal stenosis (Left: L5-S1) 07/18/2022   Obesity, Class II, BMI 35-39.9 07/18/2022   Osteoarthritis of facet joint of lumbar spine 07/18/2022   Primary osteoarthritis of lumbar spine 07/18/2022   History of total knee replacement (Right) 07/18/2022   Chronic knee pain s/p TKR (Right) 07/18/2022   Chronic medial knee pain (Right) 07/18/2022   Decreased range of motion of lumbar spine 07/18/2022   Pharmacologic therapy 07/15/2022   Disorder of skeletal system 07/15/2022  Problems influencing health status 07/15/2022   Gastroesophageal reflux disease 09/07/2021   Colon cancer screening 09/07/2021   Hx of hysterectomy 06/14/2021   Elevated transaminase level 02/04/2020   Patellofemoral arthritis of knee (Right) 07/14/2019   History of DVT in adulthood 06/23/2019   Gait abnormality 03/12/2017   Radiculopathy due to lumbar intervertebral disc disorder 09/21/2016   Spondylosis without myelopathy or radiculopathy, lumbar region 09/21/2016   History of anxiety 09/08/2016   Primary osteoarthritis of knee  (Right) 08/30/2016   Primary insomnia 08/30/2016   Myofascial pain 08/30/2016   Sciatica, right side 12/12/2015   Morbid obesity (HCC) 08/12/2015   Chronic anxiety 07/08/2013   Swelling of breast 04/08/2013   Chronic pain syndrome 03/11/2013   Port catheter in place 12/31/2012   Constipation 10/01/2012   Anemia 10/01/2012   Rectal bleeding 10/01/2012   Elevated alkaline phosphatase level 10/01/2012   MS (multiple sclerosis) (HCC) 09/02/2012   Anxiety 11/29/2011   Headache(784.0) 11/29/2011   ANEMIA 04/12/2010   Other dysphagia 04/12/2010    PCP: Lilyan Punt, MD  REFERRING PROVIDER: Babs Sciara, MD  REFERRING DIAG: M54.9 (ICD-10-CM) - Back pain, unspecified back location, unspecified back pain laterality, unspecified chronicity  Rationale for Evaluation and Treatment: Rehabilitation  THERAPY DIAG:  Low back pain, unspecified back pain laterality, unspecified chronicity, unspecified whether sciatica present  Difficulty in walking, not elsewhere classified  Muscle weakness (generalized)  Other abnormalities of gait and mobility  ONSET DATE: worse since 2017   SUBJECTIVE:                                                                                                                                                                                           SUBJECTIVE STATEMENT: Patient reports injection is wearing off although she feels it still helped her some.  Reports some stiffness in thoracic spine today; can have them every 6 month.  States her issues per MD are at L5-S1; has been stretching her quads some at home.    Eval: Diagnosis with MS years ago; sees neurologist in [facility] ; has generally done well but in 2017 started having more issues; after TKA and COVD " I just haven't bounced back"; reports stiffness, back pain and leg pain; MS has affected Right side more.  Pt accompanied by: self   PERTINENT HISTORY:   seeing pain management at [facility], Dr.  Jola Schmidt for injections LBP;  TKA 2021 R ; then COVD shortly at the end of 2021  x 2 week in the hospital Also had some injections in the back per Dr. Murray Hodgkins that did help some    PAIN:  Are you having pain? Yes: NPRS scale: 0/10 Pain location: low back and right leg Pain description: throbbing and nagging aching; constant Aggravating factors: activity Relieving factors: rest, injections  PRECAUTIONS: Fall  WEIGHT BEARING RESTRICTIONS: No  FALLS:  Has patient fallen in last 6 months? No  OCCUPATION: not working  PLOF: Independent with basic ADLs  PATIENT GOALS: get off some of these pain meds  NEXT MD VISIT: PCP [place] end of July;  12/04/22 for steroid injection  OBJECTIVE:   DIAGNOSTIC FINDINGS:  CLINICAL DATA:  Low back pain. Chronic bilateral low back pain without sciatica. Lumbar facet arthropathy. Lumbar facet joint pain. Degenerative disc disease.   EXAM: LUMBAR SPINE - COMPLETE WITH BENDING VIEWS   COMPARISON:  Lumbar MRI 10/11/2017   FINDINGS: AP, bilateral oblique, lateral, lateral flexion, lateral extension and lateral L5-S1 spot views obtained. 5 non-rib-bearing lumbar vertebra with tiny ribs at L1 as before.   There is 4-5 mm of anterolisthesis of L4 on L5 present on flexion and extension, this is 0 mm on neutral imaging. Alignment is otherwise normal. Vertebral body heights are normal. There is mild L3-L4 and L4-L5 disc space narrowing and spurring. Mild L3-L4, moderate at L4-L5 and mild L5-S1 facet hypertrophy. No evidence of fracture focal bone abnormalities. The sacroiliac joints are congruent.   IMPRESSION: 1. Degenerative disc disease and facet arthropathy at L3-L4 and L4-L5. 2. Grade 1 anterolisthesis of L4 on L5 measuring 4-5 mm on flexion and extension, 0 mm on neutral imaging.     Electronically Signed   By: Narda Rutherford M.D.   On: 07/20/2022 15:18    PATIENT SURVEYS:  FOTO 52  COGNITION: Overall cognitive status:  Within functional limits for tasks assessed     SENSATION: Neuropathy both fee  POSTURE: rounded shoulders, forward head, and flexed trunk   PALPATION:   LUMBAR ROM:   AROM eval 12/21/22  Flexion 60% available   Extension Unable to get to neutral   Right lateral flexion    Left lateral flexion    Right rotation    Left rotation     (Blank rows = not tested)   LOWER EXTREMITY MMT:    MMT Right eval Left eval Right 10/18/22 Left 10/18/22 Right 11/16/22 Left 11/16/22 Right 12/21/22 Left 12/21/22  Hip flexion 4 4 4+ 4+ 4+ 4+ 4+ 4+  Hip extension 2 2   3- 3 3- 3-  Hip abduction          Hip adduction          Hip internal rotation          Hip external rotation          Knee flexion 3- 3-   3+ 3+ 4 (limited knee range) 4 (Limited knee range)  Knee extension 4 4 4 5 5 5 5 5   Ankle dorsiflexion 4+ 4+ 5 5 5 5 5 5   Ankle plantarflexion          Ankle inversion          Ankle eversion           (Blank rows = not tested)   FUNCTIONAL TESTS:  5 times sit to stand: 18.18 sec using arm to push up on thighs.  2 minute walk test: 224 ft (had to stop before time was up; about 36 sec less)  GAIT: Distance walked: 224 ft Assistive device utilized: None Level of assistance: Modified independence Comments: forward flexed trunk; increased back pain  TODAY'S TREATMENT:  DATE: 01/08/23 Nustep seat 10 level 3 x 5' dynamic warm up  Standing: Lumbar extensions x 10 12" box hip flexor stretch 3 x 20" 6" step up x 10 each with 1 UE assist Hip vectors 2 x 5 each Wall arches 2 x 10 Wall slides with UE with liftoff x 10 each Wall push ups x 10 Wall lumbar extension (elbows on wall sag hips to wall)  Sitting Thoracic extensions x 10 Piriformis stretching 3 x 20"  Sit to stand x 10 no UE assist   01/02/23: STS 10x  Standing: Squat 10x 2 front of  chair Vector stance 3x 5" BLE with HHA Wall arch 10x Wall bilateral UE flexion x 10 Standing hip flexor stretch 12in step height 2x 30" Tandem stance 2x 30" Lumbar extension 15x Prone: hip extension 10x  Press up 5 Supine:  Bridge 10x   12/21/22 Progress note FOTO 57 Nustep x 5' seat 10 for dynamic warm up 5 times sit to stand  14.05 sec no UE assist 2 MWT 225 ft MMT's see above  Prone knee flexion stretch 5 x 20" each   11/26/22 Standing: Lumbar extension x 15 Squats to chair x 10 Heel raises on incline 2 x 10 Incline board 5 x 20" Wall bilateral UE flexion x 10  Sitting on ball pelvic clock 12 to 6; anterior and posterior rotation 2 x 10  Tandem stance 3 x 10" each  Sitting Thoracic extensions x 10 Thoracic extension with rotation x 10 GTB shoulder horizontal abduction 2 x 10 Piriformis stretch x 20" each side   11/20/22 Standing:  Squat front of chair Lumbar rotation Weight shifting with UE overhead Rotate Lumbar extension front of bar Vector stance 5x 5" with UE support Tandem stance 2x 30"  Seated piriformis stretch 2x 30" Seated pelvic rotation 10x  Supine:  Bridge 10x 5"  11/16/22 Progress note FOTO 55 Nustep x 5' dynamic warm up seat 10 5 x sit to stand 14.32 sec no UE assist 2 MWT 264 ft (has to stop and rest briefly) MMT's see above   11/13/22 Standing: Heel raises on incline x 20 Slant board 5 x 20" Tandem stance with bow and arrow green theraband 2 x 10 each Hip abduction 2# 2 x 10 Hip extension 2# 2 x 10 Marching alternating 2# 2X10 Wall arches 2 x 10 Wall push ups 2 x 10 UE flexion against wall 2X10 Sit to stand x 10 no UE assist  10/18/22 Progress note 5 x sit to stand 14.26 sec no UE assist FOTO 57 MMT's see above  Standing: Heel raises on incline x 20 Slant board 5 x 20" Tandem stance with bow and arrow green theraband 2 x 10 each Wall arches 2 x 10 Wall push ups 2 x 10 Doorway stretch 5 x 10" Hip abduction 2# 2 x  10 Hip extension 2# 2 x 10 Sit to stand x 10 no UE assist Seated: Shoulder horizontal abduction GTB 2 x 10 Thoracic extension over chair x 20  10/16/22 Standing: Heel raises x 10 Slant board 3 x 20" Hip vectors 2" hold; 2 x 5 each  GTB shoulder rows and ext 2 x 10 each Lumbar extension x 10  Sitting: GTB shoulder abduction 2 x 10 Thoracic extension over chair 2 x 10  Sit to stand 3 x 5 no UE assist    10/09/22:  GTB  shoulder extension 10x  Rows 10x  3D hip excursion (Squat then extension 10x front  of chair for mechanics, lateral weight shifting with UE reach, rotation)  Hip flexor stance on 12in step 3x30"  Wall arch 10x    10/02/22: Prone press up 10x 10"  Standing: Squat then extension 10x front of chair for mechanics RTB shoulder extension 10x  Rows 10x   Hip flexor stance on 12in step 3x20"  STS 10x eccentric control  09/28/22:  Sidelying abduction 10x Supine: Bridge 2sets 10x Thomas stretch on edge of mat 2 x 30" towel assistance Prone press up 10x 10" Rest break infront of fan to address fatigue STS 5x Standing lumbar extension  09/26/22: Reviewed goals, discussed benefits with HEP compliance for maximal benefits. 2 sets x 5 reps STS with eccentric control Standing lumbar extension Prone press up 10x 30" holds Glut sets 10x 5" Attempted knee bent pushing foot towards ceiling- too difficult Supine:  Bridge 10x 5" eccentric lowering Marching 10x5 " with ab set Sidelying: clam    09/20/22 physical therapy evaluation and HEP instruction    PATIENT EDUCATION:  Education details: Patient educated on exam findings, POC, scope of PT, HEP, and what to expect next visit. Person educated: Patient Education method: Explanation, Demonstration, and Handouts Education comprehension: verbalized understanding, returned demonstration, verbal cues required, and tactile cues required  HOME EXERCISE PROGRAM: 01/08/23 wall lumbar extension 12/21/22 knee flexion  with strap 10/18/22 shoulder habduction with Tband, bow and arrow and sitting thoracic extension Access Code: [email or web address] URL: [email or web address] Date: 09/20/2022 Prepared by: [facility]  Exercises - Static Prone on Elbows  - 2 x daily - 7 x weekly - 1 sets - 1 reps - 3 min hold - Prone Press Up  - 2 x daily - 7 x weekly - 1 sets - 5 reps - Prone Gluteal Sets  - 2 x daily - 7 x weekly - 1 sets - 10 reps - 5" hold - Supine Bridge  - 2 x daily - 7 x weekly - 3 sets - 10 reps  Access Code: [email or web address] URL: [email or web address] Date: 09/28/2022 Prepared by: Becky Sax  Exercises - Static Prone on Elbows  - 2 x daily - 7 x weekly - 1 sets - 1 reps - 3 min hold - Prone Press Up  - 2 x daily - 7 x weekly - 1 sets - 5 reps - Prone Gluteal Sets  - 2 x daily - 7 x weekly - 1 sets - 10 reps - 5" hold - Supine Bridge  - 2 x daily - 7 x weekly - 3 sets - 10 reps - Supine Lower Trunk Rotation  - 2 x daily - 7 x weekly - 1 sets - 10 reps - Supine Transversus Abdominis Bracing - Hands on Stomach  - 2 x daily - 7 x weekly - 1 sets - 10 reps - Prone Hip Extension  - 1 x daily - 7 x weekly - 1-2 sets - 10 reps- when ready - Clam  - 1 x daily - 7 x weekly - 3 sets - 10 reps - 5" hold -Supine Thomas stretch 3x 30" with towel assistance -ASSESSMENT:  CLINICAL IMPRESSION: Session focus with proximal strengthening, lumbar mobility and balance.  Added lumbar extension at wall; patient with reports of decreased pain in the thoracic spine after treatment today.  Continues with tight hip flexors and weak glutes; posterior chain.    Patient will benefit from continued skilled therapy services to address deficits and promote return to optimal function.      Eval: Patient  is a 53 y.o. female who was seen today for physical therapy evaluation and treatment for low back pain that radiates down right leg.  Patient  presents to physical therapy with complaint of back pain and leg and back  weakness. Patient demonstrates muscle weakness, reduced ROM, and fascial restrictions which are likely contributing to symptoms of pain and are negatively impacting patient ability to perform ADLs and functional mobility tasks. Patient will benefit from skilled physical therapy services to address these deficits to reduce pain and improve level of function with ADLs and functional mobility tasks.    OBJECTIVE IMPAIRMENTS: Abnormal gait, decreased activity tolerance, decreased mobility, difficulty walking, decreased ROM, decreased strength, hypomobility, increased fascial restrictions, impaired perceived functional ability, postural dysfunction, and pain.   ACTIVITY LIMITATIONS: carrying, lifting, bending, sitting, standing, squatting, sleeping, stairs, transfers, locomotion level, and caring for others  PARTICIPATION LIMITATIONS: meal prep, cleaning, laundry, shopping, community activity, and yard work  PERSONAL FACTORS: 1 comorbidity: MS  are also affecting patient's functional outcome.   REHAB POTENTIAL: Good  CLINICAL DECISION MAKING: Stable/uncomplicated  EVALUATION COMPLEXITY: Low   GOALS: Goals reviewed with patient? No  SHORT TERM GOALS: Target date: 10/04/2022  patient will be independent with initial HEP   Baseline: Goal status: MET  2.  Patient will self report 30% improvement to improve tolerance for functional activity  Baseline:  Goal status: MET    LONG TERM GOALS: Target date: 11/16/2022  Patient will be independent in self management strategies to improve quality of life and functional outcomes.   Baseline:  Goal status: IN PROGRESS  2.  Patient will self report 50% improvement to improve tolerance for functional activity  Baseline:  Goal status: MET  3.  Patient will improve FOTO score to predicted value (61)   Baseline: 52; 57 10/18/22; 12/21/22 57 Goal status: IN PROGRESS  4.  Patient will increase distance on to 275 ft  to demonstrate  improved functional mobility walking household and community distances.   Baseline: 224 ft; 11/16/22 268 ft  Goal status: IN PROGRESS  5.  Patient will increase  leg MMTs to 4-5/5 without pain to promote return to ambulation community distances with minimal deviation.  Baseline: see above Goal status: IN PROGRESS  PLAN:  PT FREQUENCY: 1x/week  PT DURATION: 4 weeks  PLANNED INTERVENTIONS: Therapeutic exercises, Therapeutic activity, Neuromuscular re-education, Balance training, Gait training, Patient/Family education, Joint manipulation, Joint mobilization, Stair training, Orthotic/Fit training, DME instructions, Aquatic Therapy, Dry Needling, Electrical stimulation, Spinal manipulation, Spinal mobilization, Cryotherapy, Moist heat, Compression bandaging, scar mobilization, Splintting, Taping, Traction, Ultrasound, Ionotophoresis 4mg /ml Dexamethasone, and Manual therapy .  PLAN FOR NEXT SESSION:  extend 1 x a week x 4 weeks to address remaining unmet and partially met goals   1:43 PM, 01/08/23 Malanie Koloski Small Neoma Uhrich MPT [facility] [place] [phone]

## 2023-01-16 ENCOUNTER — Encounter (HOSPITAL_COMMUNITY): Payer: 59

## 2023-01-21 ENCOUNTER — Encounter (HOSPITAL_COMMUNITY): Payer: 59

## 2023-01-25 ENCOUNTER — Encounter (HOSPITAL_COMMUNITY): Payer: 59

## 2023-01-29 ENCOUNTER — Ambulatory Visit (HOSPITAL_COMMUNITY): Payer: 59 | Attending: Family Medicine

## 2023-01-29 DIAGNOSIS — M6281 Muscle weakness (generalized): Secondary | ICD-10-CM | POA: Insufficient documentation

## 2023-01-29 DIAGNOSIS — R2689 Other abnormalities of gait and mobility: Secondary | ICD-10-CM | POA: Diagnosis present

## 2023-01-29 DIAGNOSIS — R262 Difficulty in walking, not elsewhere classified: Secondary | ICD-10-CM | POA: Insufficient documentation

## 2023-01-29 DIAGNOSIS — M545 Low back pain, unspecified: Secondary | ICD-10-CM | POA: Diagnosis present

## 2023-01-29 NOTE — Therapy (Signed)
OUTPATIENT PHYSICAL THERAPY THORACOLUMBAR DISCHARGE PHYSICAL THERAPY DISCHARGE SUMMARY  Visits from Start of Care: 15 Current functional level related to goals / functional outcomes: See below   Remaining deficits: See below   Education / Equipment: HEP   Patient agrees to discharge. Patient goals were partially met. Patient is being discharged due to being pleased with the current functional level.    Patient Name: Melinda Moore MRN: [id number] DOB:[birth year], 52 y.o., female Today's Date: 01/29/2023  END OF SESSION:  PT End of Session - 01/29/23 1429     Visit Number 15    Number of Visits 16    Date for PT Re-Evaluation 01/18/23    Authorization Type U[employer]    Authorization - Number of Visits 60    PT Start Time 1431    PT Stop Time 1511    PT Time Calculation (min) 40 min    Activity Tolerance Patient tolerated treatment well    Behavior During Therapy WFL for tasks assessed/performed               Past Medical History:  Diagnosis Date   Anemia    Anxiety    Bell's palsy    Depression    DVT (deep venous thrombosis) (HCC) 09/02/2012   [place] on 02/13/2012 showed acute DVT in left calf veins and posterior tibial veins   Fibromyalgia    GERD (gastroesophageal reflux disease)    History of blood transfusion 2000   History of trigeminal neuralgia    HTN (hypertension)    patient denies was taking a blood pressure medication in early 2000 but it was migraines   Leukopenia    MS (multiple sclerosis) (HCC) 1997   Neuropathic pain    Peripheral edema    Port catheter in place 12/31/2012   Right bundle branch block    Past Surgical History:  Procedure Laterality Date   ABDOMINAL HYSTERECTOMY     CHOLECYSTECTOMY     COLONOSCOPY WITH PROPOFOL N/A 09/27/2021   Procedure: COLONOSCOPY WITH PROPOFOL;  Surgeon: Corbin Ade, MD;  Location: [facility];  Service: Endoscopy;  Laterality: N/A;  8:00am, asa 2   ESOPHAGOGASTRODUODENOSCOPY  2011   Dr. Jena Gauss. Possible  cervical esophageal web status post disruption with passage of Maloney dilator, localized esophageal erythema, antral erosions. Biopsy from the stomach revealed minimal chronic inactive inflammation but negative for H. pylori   FLEXIBLE SIGMOIDOSCOPY N/A 02/08/2022   Procedure: FLEXIBLE SIGMOIDOSCOPY;  Surgeon: Corbin Ade, MD;  Location: [facility];  Service: Endoscopy;  Laterality: N/A;   LAPAROSCOPIC GASTRIC SLEEVE RESECTION N/A 04/17/2016   Procedure: LAPAROSCOPIC GASTRIC SLEEVE RESECTION WITH UPPER ENDOSCOPY;  Surgeon: Glenna Fellows, MD;  Location: [facility];  Service: General;  Laterality: N/A;   PATELLA-FEMORAL ARTHROPLASTY Right 07/14/2019   Procedure: PATELLA-FEMORAL ARTHROPLASTY;  Surgeon: Teryl Lucy, MD;  Location: [facility];  Service: Orthopedics;  Laterality: Right;   PORTACATH PLACEMENT     TUBAL LIGATION  2001   Patient Active Problem List   Diagnosis Date Noted   Chronic low back pain (1ry area of Pain) (Bilateral) (R>L) w/o sciatica 07/18/2022   Lumbar facet arthropathy (Bilateral) 07/18/2022   Lumbar facet joint pain 07/18/2022   Chronic lower extremity pain (2ry area of Pain) (Right) 07/18/2022   DDD (degenerative disc disease), lumbosacral 07/18/2022   Abnormal MRI, lumbar spine (10/11/2017) 07/18/2022   Lumbosacral foraminal stenosis (Left: L5-S1) 07/18/2022   Obesity, Class II, BMI 35-39.9 07/18/2022   Osteoarthritis of facet joint of lumbar  spine 07/18/2022   Primary osteoarthritis of lumbar spine 07/18/2022   History of total knee replacement (Right) 07/18/2022   Chronic knee pain s/p TKR (Right) 07/18/2022   Chronic medial knee pain (Right) 07/18/2022   Decreased range of motion of lumbar spine 07/18/2022   Pharmacologic therapy 07/15/2022   Disorder of skeletal system 07/15/2022   Problems influencing health status 07/15/2022   Gastroesophageal reflux disease 09/07/2021   Colon cancer screening 09/07/2021   Hx of hysterectomy 06/14/2021   Elevated  transaminase level 02/04/2020   Patellofemoral arthritis of knee (Right) 07/14/2019   History of DVT in adulthood 06/23/2019   Gait abnormality 03/12/2017   Radiculopathy due to lumbar intervertebral disc disorder 09/21/2016   Spondylosis without myelopathy or radiculopathy, lumbar region 09/21/2016   History of anxiety 09/08/2016   Primary osteoarthritis of knee (Right) 08/30/2016   Primary insomnia 08/30/2016   Myofascial pain 08/30/2016   Sciatica, right side 12/12/2015   Morbid obesity (HCC) 08/12/2015   Chronic anxiety 07/08/2013   Swelling of breast 04/08/2013   Chronic pain syndrome 03/11/2013   Port catheter in place 12/31/2012   Constipation 10/01/2012   Anemia 10/01/2012   Rectal bleeding 10/01/2012   Elevated alkaline phosphatase level 10/01/2012   MS (multiple sclerosis) (HCC) 09/02/2012   Anxiety 11/29/2011   Headache(784.0) 11/29/2011   ANEMIA 04/12/2010   Other dysphagia 04/12/2010    PCP: Lilyan Punt, MD  REFERRING PROVIDER: Babs Sciara, MD  REFERRING DIAG: M54.9 (ICD-10-CM) - Back pain, unspecified back location, unspecified back pain laterality, unspecified chronicity  Rationale for Evaluation and Treatment: Rehabilitation  THERAPY DIAG:  Low back pain, unspecified back pain laterality, unspecified chronicity, unspecified whether sciatica present  Difficulty in walking, not elsewhere classified  Muscle weakness (generalized)  Other abnormalities of gait and mobility  ONSET DATE: worse since 2017   SUBJECTIVE:                                                                                                                                                                                           SUBJECTIVE STATEMENT: Patient states shot has now worn off; she missed a few appointments due to injuring her right big toe; had to have toenail removed and it got infected; now is better.  She does feel better and move better when she does her exercises.   Maybe 60-65% better overall   Eval: Diagnosis with MS years ago; sees neurologist in [facility] ; has generally done well but in 2017 started having more issues; after TKA and COVD " I just haven't bounced back"; reports stiffness, back pain and leg pain; MS has affected  Right side more.  Pt accompanied by: self   PERTINENT HISTORY:   seeing pain management at [facility], Dr. Jola Schmidt for injections LBP;  TKA 2021 R ; then COVD shortly at the end of 2021  x 2 week in the hospital Also had some injections in the back per Dr. Murray Hodgkins that did help some    PAIN:  Are you having pain? Yes: NPRS scale: 0/10 Pain location: low back and right leg Pain description: throbbing and nagging aching; constant Aggravating factors: activity Relieving factors: rest, injections  PRECAUTIONS: Fall  WEIGHT BEARING RESTRICTIONS: No  FALLS:  Has patient fallen in last 6 months? No  OCCUPATION: not working  PLOF: Independent with basic ADLs  PATIENT GOALS: get off some of these pain meds  NEXT MD VISIT: PCP [place] end of July;  12/04/22 for steroid injection  OBJECTIVE:   DIAGNOSTIC FINDINGS:  CLINICAL DATA:  Low back pain. Chronic bilateral low back pain without sciatica. Lumbar facet arthropathy. Lumbar facet joint pain. Degenerative disc disease.   EXAM: LUMBAR SPINE - COMPLETE WITH BENDING VIEWS   COMPARISON:  Lumbar MRI 10/11/2017   FINDINGS: AP, bilateral oblique, lateral, lateral flexion, lateral extension and lateral L5-S1 spot views obtained. 5 non-rib-bearing lumbar vertebra with tiny ribs at L1 as before.   There is 4-5 mm of anterolisthesis of L4 on L5 present on flexion and extension, this is 0 mm on neutral imaging. Alignment is otherwise normal. Vertebral body heights are normal. There is mild L3-L4 and L4-L5 disc space narrowing and spurring. Mild L3-L4, moderate at L4-L5 and mild L5-S1 facet hypertrophy. No evidence of fracture focal bone abnormalities. The  sacroiliac joints are congruent.   IMPRESSION: 1. Degenerative disc disease and facet arthropathy at L3-L4 and L4-L5. 2. Grade 1 anterolisthesis of L4 on L5 measuring 4-5 mm on flexion and extension, 0 mm on neutral imaging.     Electronically Signed   By: Narda Rutherford M.D.   On: 07/20/2022 15:18    PATIENT SURVEYS:  FOTO 52  COGNITION: Overall cognitive status: Within functional limits for tasks assessed     SENSATION: Neuropathy both fee  POSTURE: rounded shoulders, forward head, and flexed trunk   PALPATION:   LUMBAR ROM:   AROM eval 12/21/22  Flexion 60% available   Extension Unable to get to neutral   Right lateral flexion    Left lateral flexion    Right rotation    Left rotation     (Blank rows = not tested)   LOWER EXTREMITY MMT:    MMT Right eval Left eval Right 10/18/22 Left 10/18/22 Right 11/16/22 Left 11/16/22 Right 12/21/22 Left 12/21/22  Hip flexion 4 4 4+ 4+ 4+ 4+ 4+ 4+  Hip extension 2 2   3- 3 3- 3-  Hip abduction          Hip adduction          Hip internal rotation          Hip external rotation          Knee flexion 3- 3-   3+ 3+ 4 (limited knee range) 4 (Limited knee range)  Knee extension 4 4 4 5 5 5 5 5   Ankle dorsiflexion 4+ 4+ 5 5 5 5 5 5   Ankle plantarflexion          Ankle inversion          Ankle eversion           (Blank rows =  not tested)   FUNCTIONAL TESTS:  5 times sit to stand: 18.18 sec using arm to push up on thighs.  2 minute walk test: 224 ft (had to stop before time was up; about 36 sec less)  GAIT: Distance walked: 224 ft Assistive device utilized: None Level of assistance: Modified independence Comments: forward flexed trunk; increased back pain  TODAY'S TREATMENT:                                                                                                                              DATE: 01/29/23 Progress note Nustep seat 10 level 3 x 5' dynamic warm up Standing lumbar extensions over bar x  15 FOTO 57 5 times sit to stand  12.82 sec no UE assist 2 MWT 325 ft    01/08/23 Nustep seat 10 level 3 x 5' dynamic warm up  Standing: Lumbar extensions x 10 12" box hip flexor stretch 3 x 20" 6" step up x 10 each with 1 UE assist Hip vectors 2 x 5 each Wall arches 2 x 10 Wall slides with UE with liftoff x 10 each Wall push ups x 10 Wall lumbar extension (elbows on wall sag hips to wall)  Sitting Thoracic extensions x 10 Piriformis stretching 3 x 20"  Sit to stand x 10 no UE assist   01/02/23: STS 10x  Standing: Squat 10x 2 front of chair Vector stance 3x 5" BLE with HHA Wall arch 10x Wall bilateral UE flexion x 10 Standing hip flexor stretch 12in step height 2x 30" Tandem stance 2x 30" Lumbar extension 15x Prone: hip extension 10x  Press up 5 Supine:  Bridge 10x   12/21/22 Progress note FOTO 57 Nustep x 5' seat 10 for dynamic warm up 5 times sit to stand  14.05 sec no UE assist 2 MWT 225 ft MMT's see above  Prone knee flexion stretch 5 x 20" each   11/26/22 Standing: Lumbar extension x 15 Squats to chair x 10 Heel raises on incline 2 x 10 Incline board 5 x 20" Wall bilateral UE flexion x 10  Sitting on ball pelvic clock 12 to 6; anterior and posterior rotation 2 x 10  Tandem stance 3 x 10" each  Sitting Thoracic extensions x 10 Thoracic extension with rotation x 10 GTB shoulder horizontal abduction 2 x 10 Piriformis stretch x 20" each side   11/20/22 Standing:  Squat front of chair Lumbar rotation Weight shifting with UE overhead Rotate Lumbar extension front of bar Vector stance 5x 5" with UE support Tandem stance 2x 30"  Seated piriformis stretch 2x 30" Seated pelvic rotation 10x  Supine:  Bridge 10x 5"  11/16/22 Progress note FOTO 55 Nustep x 5' dynamic warm up seat 10 5 x sit to stand 14.32 sec no UE assist 2 MWT 264 ft (has to stop and rest briefly) MMT's see above   11/13/22 Standing: Heel raises on incline x 20 Slant  board 5  x 20" Tandem stance with bow and arrow green theraband 2 x 10 each Hip abduction 2# 2 x 10 Hip extension 2# 2 x 10 Marching alternating 2# 2X10 Wall arches 2 x 10 Wall push ups 2 x 10 UE flexion against wall 2X10 Sit to stand x 10 no UE assist  10/18/22 Progress note 5 x sit to stand 14.26 sec no UE assist FOTO 57 MMT's see above  Standing: Heel raises on incline x 20 Slant board 5 x 20" Tandem stance with bow and arrow green theraband 2 x 10 each Wall arches 2 x 10 Wall push ups 2 x 10 Doorway stretch 5 x 10" Hip abduction 2# 2 x 10 Hip extension 2# 2 x 10 Sit to stand x 10 no UE assist Seated: Shoulder horizontal abduction GTB 2 x 10 Thoracic extension over chair x 20  10/16/22 Standing: Heel raises x 10 Slant board 3 x 20" Hip vectors 2" hold; 2 x 5 each  GTB shoulder rows and ext 2 x 10 each Lumbar extension x 10  Sitting: GTB shoulder abduction 2 x 10 Thoracic extension over chair 2 x 10  Sit to stand 3 x 5 no UE assist    10/09/22:  GTB  shoulder extension 10x  Rows 10x  3D hip excursion (Squat then extension 10x front of chair for mechanics, lateral weight shifting with UE reach, rotation)  Hip flexor stance on 12in step 3x30"  Wall arch 10x    10/02/22: Prone press up 10x 10"  Standing: Squat then extension 10x front of chair for mechanics RTB shoulder extension 10x  Rows 10x   Hip flexor stance on 12in step 3x20"  STS 10x eccentric control  09/28/22:  Sidelying abduction 10x Supine: Bridge 2sets 10x Thomas stretch on edge of mat 2 x 30" towel assistance Prone press up 10x 10" Rest break infront of fan to address fatigue STS 5x Standing lumbar extension  09/26/22: Reviewed goals, discussed benefits with HEP compliance for maximal benefits. 2 sets x 5 reps STS with eccentric control Standing lumbar extension Prone press up 10x 30" holds Glut sets 10x 5" Attempted knee bent pushing foot towards ceiling- too difficult Supine:   Bridge 10x 5" eccentric lowering Marching 10x5 " with ab set Sidelying: clam    09/20/22 physical therapy evaluation and HEP instruction    PATIENT EDUCATION:  Education details: Patient educated on exam findings, POC, scope of PT, HEP, and what to expect next visit. Person educated: Patient Education method: Explanation, Demonstration, and Handouts Education comprehension: verbalized understanding, returned demonstration, verbal cues required, and tactile cues required  HOME EXERCISE PROGRAM: 01/08/23 wall lumbar extension 12/21/22 knee flexion with strap 10/18/22 shoulder habduction with Tband, bow and arrow and sitting thoracic extension Access Code: [email or web address] URL: [email or web address] Date: 09/20/2022 Prepared by: [facility]  Exercises - Static Prone on Elbows  - 2 x daily - 7 x weekly - 1 sets - 1 reps - 3 min hold - Prone Press Up  - 2 x daily - 7 x weekly - 1 sets - 5 reps - Prone Gluteal Sets  - 2 x daily - 7 x weekly - 1 sets - 10 reps - 5" hold - Supine Bridge  - 2 x daily - 7 x weekly - 3 sets - 10 reps  Access Code: [email or web address] URL: [email or web address] Date: 09/28/2022 Prepared by: Becky Sax  Exercises - Static Prone on Elbows  - 2 x daily - 7 x weekly - 1 sets -  1 reps - 3 min hold - Prone Press Up  - 2 x daily - 7 x weekly - 1 sets - 5 reps - Prone Gluteal Sets  - 2 x daily - 7 x weekly - 1 sets - 10 reps - 5" hold - Supine Bridge  - 2 x daily - 7 x weekly - 3 sets - 10 reps - Supine Lower Trunk Rotation  - 2 x daily - 7 x weekly - 1 sets - 10 reps - Supine Transversus Abdominis Bracing - Hands on Stomach  - 2 x daily - 7 x weekly - 1 sets - 10 reps - Prone Hip Extension  - 1 x daily - 7 x weekly - 1-2 sets - 10 reps- when ready - Clam  - 1 x daily - 7 x weekly - 3 sets - 10 reps - 5" hold -Supine Thomas stretch 3x 30" with towel assistance -ASSESSMENT:  CLINICAL IMPRESSION: Progress note today.  Met goal for 2 MWT.  Patient  with progress plateau and is agreeable to discharge at this time to HEP and the [facility].     Eval: Patient is a 52 y.o. female who was seen today for physical therapy evaluation and treatment for low back pain that radiates down right leg.  Patient  presents to physical therapy with complaint of back pain and leg and back weakness. Patient demonstrates muscle weakness, reduced ROM, and fascial restrictions which are likely contributing to symptoms of pain and are negatively impacting patient ability to perform ADLs and functional mobility tasks. Patient will benefit from skilled physical therapy services to address these deficits to reduce pain and improve level of function with ADLs and functional mobility tasks.    OBJECTIVE IMPAIRMENTS: Abnormal gait, decreased activity tolerance, decreased mobility, difficulty walking, decreased ROM, decreased strength, hypomobility, increased fascial restrictions, impaired perceived functional ability, postural dysfunction, and pain.   ACTIVITY LIMITATIONS: carrying, lifting, bending, sitting, standing, squatting, sleeping, stairs, transfers, locomotion level, and caring for others  PARTICIPATION LIMITATIONS: meal prep, cleaning, laundry, shopping, community activity, and yard work  PERSONAL FACTORS: 1 comorbidity: MS  are also affecting patient's functional outcome.   REHAB POTENTIAL: Good  CLINICAL DECISION MAKING: Stable/uncomplicated  EVALUATION COMPLEXITY: Low   GOALS: Goals reviewed with patient? No  SHORT TERM GOALS: Target date: 10/04/2022  patient will be independent with initial HEP   Baseline: Goal status: MET  2.  Patient will self report 30% improvement to improve tolerance for functional activity  Baseline:  Goal status: MET    LONG TERM GOALS: Target date: 11/16/2022  Patient will be independent in self management strategies to improve quality of life and functional outcomes.   Baseline:  Goal status: MET  2.  Patient  will self report 50% improvement to improve tolerance for functional activity  Baseline:  Goal status: MET  3.  Patient will improve FOTO score to predicted value (61)   Baseline: 52; 57 10/18/22; 12/21/22 57 Goal status: IN PROGRESS  4.  Patient will increase distance on to 275 ft  to demonstrate improved functional mobility walking household and community distances.   Baseline: 224 ft; 11/16/22 268 ft ; 325 ft 01/29/24 Goal status: MET  5.  Patient will increase  leg MMTs to 4-5/5 without pain to promote return to ambulation community distances with minimal deviation.  Baseline: see above Goal status: IN PROGRESS  PLAN:  PT FREQUENCY: 1x/week  PT DURATION: 4 weeks  PLANNED INTERVENTIONS: Therapeutic exercises, Therapeutic activity, Neuromuscular re-education,  Balance training, Gait training, Patient/Family education, Joint manipulation, Joint mobilization, Stair training, Orthotic/Fit training, DME instructions, Aquatic Therapy, Dry Needling, Electrical stimulation, Spinal manipulation, Spinal mobilization, Cryotherapy, Moist heat, Compression bandaging, scar mobilization, Splintting, Taping, Traction, Ultrasound, Ionotophoresis 4mg /ml Dexamethasone, and Manual therapy .  PLAN FOR NEXT SESSION: discharge.  3:05 PM, 01/29/23 Lavana Huckeba Small Tamara Kenyon MPT [facility] [place] [phone]

## 2023-02-05 DIAGNOSIS — M5416 Radiculopathy, lumbar region: Secondary | ICD-10-CM | POA: Diagnosis not present

## 2023-02-06 ENCOUNTER — Encounter (HOSPITAL_COMMUNITY): Payer: 59

## 2023-02-07 ENCOUNTER — Ambulatory Visit: Payer: 59 | Admitting: Family Medicine

## 2023-02-08 ENCOUNTER — Ambulatory Visit (INDEPENDENT_AMBULATORY_CARE_PROVIDER_SITE_OTHER): Payer: 59 | Admitting: Family Medicine

## 2023-02-08 ENCOUNTER — Encounter: Payer: Self-pay | Admitting: Family Medicine

## 2023-02-08 VITALS — BP 108/71 | HR 95 | Temp 97.9°F | Ht 69.0 in | Wt 262.2 lb

## 2023-02-08 DIAGNOSIS — F439 Reaction to severe stress, unspecified: Secondary | ICD-10-CM

## 2023-02-08 DIAGNOSIS — Z79891 Long term (current) use of opiate analgesic: Secondary | ICD-10-CM | POA: Diagnosis not present

## 2023-02-08 MED ORDER — HYDROMORPHONE HCL 4 MG PO TABS: ORAL_TABLET | ORAL | 0 refills | Status: DC

## 2023-02-08 NOTE — Progress Notes (Signed)
Subjective:    Patient ID: Melinda Moore, female    DOB: 1971-03-31, 52 y.o.   MRN: [id number]  HPI Patient coming in for a 3 month follow up. No further issues or concerns.  This patient was seen today for chronic pain  The medication list was reviewed and updated.   Location of Pain for which the patient has been treated with regarding narcotics: Back pain sciatica pain right side, patient also with MS and MS related pain she has been on opioids for years was started by her neurologist who no longer is in practice we have been treating this chronic pain for several years  Onset of this pain: Present for years   -Compliance with medication: Good compliance  - Number patient states they take daily: Maximum of 4/day  -Reason for ongoing use of opioids ongoing pain and discomfort in her back sciatica down the right side as well as MS related muscle pain  What other measures have been tried outside of opioids Tylenol, NSAIDs, antidepressants  In the ongoing specialists regarding this condition has seen neurologist in the past.  -when was the last dose patient took?  Earlier today  The patient was advised the importance of maintaining medication and not using illegal substances with these.  Here for refills and follow up  The patient was educated that we can provide 3 monthly scripts for their medication, it is their responsibility to follow the instructions.  Side effects or complications from medications: None  Patient is aware that pain medications are meant to minimize the severity of the pain to allow their pain levels to improve to allow for better function. They are aware of that pain medications cannot totally remove their pain.  Due for UDT ( at least once per year) (pain management contract is also completed at the time of the UDT): Due today  Scale of 1 to 10 ( 1 is least 10 is most) Your pain level without the medicine: 8 Your pain level with medication 5  Scale 1 to  10 ( 1-helps very little, 10 helps very well) How well does your pain medication reduce your pain so you can function better through out the day?  8  Quality of the pain: Aching throbbing burning  Persistence of the pain: Present all the time  Modifying factors: Worse with activity  The 10-year ASCVD risk score (Arnett DK, et al., 2019) is: 2.5%   Values used to calculate the score:     Age: 14 years     Sex: Female     Is Non-Hispanic African American: Yes     Diabetic: No     Tobacco smoker: No     Systolic Blood Pressure: 108 mmHg     Is BP treated: Yes     HDL Cholesterol: 56 mg/dL     Total Cholesterol: 251 mg/dL    Patient under stress currently.  Going through some marriage related issues.  We did discuss how she is doing and I would recommend counseling for her she is open to this Previous lumbar x-rays from earlier this year reviewed Review of Systems     Objective:   Physical Exam  General-in no acute distress Eyes-no discharge Lungs-respiratory rate normal, CTA CV-no murmurs,RRR Extremities skin warm dry no edema Neuro grossly normal Behavior normal, alert Negative straight leg raise bilateral  Lab work from June was reviewed with patient.  Alkaline phos slightly elevated will monitor this for trends, metabolic 7 looks good,  CBC looks good, mild elevation of LDL cholesterol but not enough to be on statins HDL looks good    Assessment & Plan:  1. Encounter for long-term opiate analgesic use The patient was seen in followup for chronic pain. A review over at their current pain status was discussed. Drug registry was checked. Prescriptions were given.  Regular follow-up recommended. Discussion was held regarding the importance of compliance with medication as well as pain medication contract.  Patient was informed that medication may cause drowsiness and should not be combined  with other medications/alcohol or street drugs. If the patient feels medication is  causing altered alertness then do not drive or operate dangerous equipment.  Should be noted that the patient appears to be meeting appropriate use of opioids and response.  Evidenced by improved function and decent pain control without significant side effects and no evidence of overt aberrancy issues.  Upon discussion with the patient today they understand that opioid therapy is optional and they feel that the pain has been refractory to reasonable conservative measures and is significant and affecting quality of life enough to warrant ongoing therapy and wishes to continue opioids.  Refills were provided.  [facility] medical Board guidelines regarding the pain medicine has been reviewed.  CDC guidelines most updated 2022 has been reviewed by the prescriber.  PDMP is checked on a regular basis yearly urine drug screen and pain management contract  Patient with sciatica on the right side as well as MS related pain drug registry was checked 3 prescription sent him - [employer] Select 13 ([place]), Urine  2. Stress Referral for counseling patient denies being depressed not suicidal  Follow-up 3 months sooner problems

## 2023-02-12 NOTE — Telephone Encounter (Signed)
Nurses-please assist patient Please go ahead with referral for Raynie to counseling with [facility] female counselor Diagnosis stress  As per couple therapy typically different groups will do marriage and couples therapy  In [place] [facility] [phone]  Also [facility] counseling partners [phone]  As for additional resources for counseling for individual counseling [facility] in [place] [phone]  Typically marriage counseling is not something that we have to do referral for it is more so a matter of the patient calling and setting up an appointment.  These places that I have listed above also have websites that she could read more about the practices before moving forward with this.  Also when she talks with them they can let her know the financial costs regarding marriage counseling  Individual counseling is typically covered through insurance

## 2023-02-13 ENCOUNTER — Other Ambulatory Visit: Payer: Self-pay

## 2023-02-13 DIAGNOSIS — F439 Reaction to severe stress, unspecified: Secondary | ICD-10-CM

## 2023-02-15 LAB — TOXASSURE SELECT 13 (MW), URINE

## 2023-02-18 ENCOUNTER — Encounter: Payer: Self-pay | Admitting: Family Medicine

## 2023-02-18 ENCOUNTER — Ambulatory Visit (INDEPENDENT_AMBULATORY_CARE_PROVIDER_SITE_OTHER): Payer: 59 | Admitting: Family Medicine

## 2023-02-18 VITALS — BP 110/78 | Temp 98.6°F | Wt 257.2 lb

## 2023-02-18 DIAGNOSIS — R350 Frequency of micturition: Secondary | ICD-10-CM | POA: Diagnosis not present

## 2023-02-18 LAB — POCT URINALYSIS DIP (CLINITEK)
Bilirubin, UA: NEGATIVE
Glucose, UA: NEGATIVE mg/dL
Ketones, POC UA: NEGATIVE mg/dL
Nitrite, UA: POSITIVE — AB
Spec Grav, UA: 1.025 (ref 1.010–1.025)
Urobilinogen, UA: 1 U/dL
pH, UA: 6 (ref 5.0–8.0)

## 2023-02-18 MED ORDER — NITROFURANTOIN MONOHYD MACRO 100 MG PO CAPS: 100.0000 mg | ORAL_CAPSULE | Freq: Two times a day (BID) | ORAL | 0 refills | Status: DC

## 2023-02-18 MED ORDER — FLUCONAZOLE 150 MG PO TABS: 150.0000 mg | ORAL_TABLET | Freq: Once | ORAL | 0 refills | Status: AC

## 2023-02-18 NOTE — Progress Notes (Signed)
Subjective:  Patient ID: Melinda Moore, female    DOB: 1970/11/20  Age: 52 y.o. MRN: [id number]  CC: Chief Complaint  Patient presents with   Urinary Tract Infection    UTI symptoms     HPI:  52 year old female presents for concerns for UTI.  Patient reports ongoing urinary frequency. States that she has had symptoms since visit on 8/30. No dysuria. No fever. No abdominal pain. Has been taking Azo. No reports of hematuria.   Patient Active Problem List   Diagnosis Date Noted   Urinary frequency 02/18/2023   Chronic low back pain (1ry area of Pain) (Bilateral) (R>L) w/o sciatica 07/18/2022   Lumbar facet arthropathy (Bilateral) 07/18/2022   Lumbar facet joint pain 07/18/2022   Chronic lower extremity pain (2ry area of Pain) (Right) 07/18/2022   DDD (degenerative disc disease), lumbosacral 07/18/2022   Abnormal MRI, lumbar spine (10/11/2017) 07/18/2022   Lumbosacral foraminal stenosis (Left: L5-S1) 07/18/2022   Obesity, Class II, BMI 35-39.9 07/18/2022   Osteoarthritis of facet joint of lumbar spine 07/18/2022   Primary osteoarthritis of lumbar spine 07/18/2022   History of total knee replacement (Right) 07/18/2022   Chronic knee pain s/p TKR (Right) 07/18/2022   Chronic medial knee pain (Right) 07/18/2022   Gastroesophageal reflux disease 09/07/2021   Hx of hysterectomy 06/14/2021   Patellofemoral arthritis of knee (Right) 07/14/2019   History of DVT in adulthood 06/23/2019   Radiculopathy due to lumbar intervertebral disc disorder 09/21/2016   Spondylosis without myelopathy or radiculopathy, lumbar region 09/21/2016   Primary osteoarthritis of knee (Right) 08/30/2016   Primary insomnia 08/30/2016   Myofascial pain 08/30/2016   Chronic anxiety 07/08/2013   Chronic pain syndrome 03/11/2013   Port catheter in place 12/31/2012   Constipation 10/01/2012   MS (multiple sclerosis) (HCC) 09/02/2012   Other dysphagia 04/12/2010    Social Hx   Social History   Socioeconomic  History   Marital status: Married    Spouse name: Viviann Spare   Number of children: 2   Years of education: some college   Highest education level: Associate degree: occupational, Scientist, product/process development, or vocational program  Occupational History   Occupation: disabled  Tobacco Use   Smoking status: Former    Current packs/day: 0.00    Types: Cigarettes    Quit date: 1990    Years since quitting: 34.7   Smokeless tobacco: Never   Tobacco comments:    4 a day, quit in 1990  Vaping Use   Vaping status: Never Used  Substance and Sexual Activity   Alcohol use: No   Drug use: No   Sexual activity: Not Currently    Birth control/protection: Surgical  Other Topics Concern   Not on file  Social History Narrative   Lives at home with husband and 2 daughters. 3 grand daughters.    Right handed   Takes 1/2 caffeine pill per day   Social Determinants of Health   Financial Resource Strain: Low Risk  (09/20/2022)   Overall Financial Resource Strain (CARDIA)    Difficulty of Paying Living Expenses: Not hard at all  Food Insecurity: No Food Insecurity (09/20/2022)   Hunger Vital Sign    Worried About Running Out of Food in the Last Year: Never true    Ran Out of Food in the Last Year: Never true  Transportation Needs: No Transportation Needs (09/20/2022)   PRAPARE - Administrator, Civil Service (Medical): No    Lack of Transportation (Non-Medical):  No  Physical Activity: Inactive (09/20/2022)   Exercise Vital Sign    Days of Exercise per Week: 0 days    Minutes of Exercise per Session: 0 min  Stress: No Stress Concern Present (09/20/2022)   [employer] of Occupational Health - Occupational Stress Questionnaire    Feeling of Stress : Not at all  Social Connections: Socially Integrated (09/20/2022)   Social Connection and Isolation Panel [NHANES]    Frequency of Communication with Friends and Family: More than three times a week    Frequency of Social Gatherings with Friends and  Family: Once a week    Attends Religious Services: More than 4 times per year    Active Member of [employer] or Organizations: Yes    Attends Engineer, structural: Patient declined    Marital Status: Married    Review of Systems Per HPI  Objective:  BP 110/78   Temp 98.6 F (37 C) (Oral)   Wt 257 lb 3.2 oz (116.7 kg)   SpO2 97%   BMI 37.98 kg/m      02/18/2023    4:07 PM 02/08/2023    1:36 PM 12/06/2022   11:40 AM  BP/Weight  Systolic BP 110 108 113  Diastolic BP 78 71 70  Wt. (Lbs) 257.2 262.2   BMI 37.98 kg/m2 38.72 kg/m2     Physical Exam Constitutional:      Appearance: Normal appearance. She is obese.  HENT:     Head: Normocephalic and atraumatic.  Cardiovascular:     Rate and Rhythm: Normal rate and regular rhythm.  Pulmonary:     Effort: Pulmonary effort is normal.     Breath sounds: Normal breath sounds.  Abdominal:     General: There is no distension.     Palpations: Abdomen is soft.     Tenderness: There is no abdominal tenderness.  Neurological:     Mental Status: She is alert.     Lab Results  Component Value Date   WBC 4.3 12/06/2022   HGB 11.9 12/06/2022   HCT 38.0 12/06/2022   PLT 328 12/06/2022   GLUCOSE 91 12/06/2022   CHOL 251 (H) 12/06/2022   TRIG 146 12/06/2022   HDL 56 12/06/2022   LDLCALC 169 (H) 12/06/2022   ALT 13 12/06/2022   AST 16 12/06/2022   NA 142 12/06/2022   K 4.8 12/06/2022   CL 103 12/06/2022   CREATININE 0.98 12/06/2022   BUN 8 12/06/2022   CO2 23 12/06/2022   TSH 2.670 05/24/2021   HGBA1C 6.2 (H) 11/02/2014     Assessment & Plan:   Problem List Items Addressed This Visit       Other   Urinary frequency - Primary    UA suggestive of UTI but she has been taking Azo. Placing on Macrobid while awaiting culture.      Relevant Orders   POCT URINALYSIS DIP (CLINITEK) (Completed)   Urine Culture    Meds ordered this encounter  Medications   nitrofurantoin, macrocrystal-monohydrate, (MACROBID) 100  MG capsule    Sig: Take 1 capsule (100 mg total) by mouth 2 (two) times daily.    Dispense:  14 capsule    Refill:  0   fluconazole (DIFLUCAN) 150 MG tablet    Sig: Take 1 tablet (150 mg total) by mouth once for 1 dose. Repeat dose in 72 hours.    Dispense:  2 tablet    Refill:  0    Follow-up:  Return if symptoms  worsen or fail to improve.  Everlene Other DO [facility]

## 2023-02-18 NOTE — Assessment & Plan Note (Signed)
UA suggestive of UTI but she has been taking Azo. Placing on Macrobid while awaiting culture.

## 2023-02-19 ENCOUNTER — Other Ambulatory Visit: Payer: Self-pay

## 2023-02-19 ENCOUNTER — Ambulatory Visit: Payer: 59 | Admitting: Family Medicine

## 2023-02-19 ENCOUNTER — Encounter (HOSPITAL_COMMUNITY): Payer: Self-pay | Admitting: Emergency Medicine

## 2023-02-19 ENCOUNTER — Emergency Department (HOSPITAL_COMMUNITY)
Admission: EM | Admit: 2023-02-19 | Discharge: 2023-02-19 | Disposition: A | Discharge status: Home / Self Care | Payer: 59 | Attending: Emergency Medicine | Admitting: Emergency Medicine

## 2023-02-19 DIAGNOSIS — N3090 Cystitis, unspecified without hematuria: Secondary | ICD-10-CM | POA: Insufficient documentation

## 2023-02-19 DIAGNOSIS — G8929 Other chronic pain: Secondary | ICD-10-CM | POA: Insufficient documentation

## 2023-02-19 DIAGNOSIS — D72829 Elevated white blood cell count, unspecified: Secondary | ICD-10-CM | POA: Diagnosis not present

## 2023-02-19 DIAGNOSIS — R300 Dysuria: Secondary | ICD-10-CM | POA: Diagnosis present

## 2023-02-19 LAB — URINALYSIS, ROUTINE W REFLEX MICROSCOPIC
Bilirubin Urine: NEGATIVE
Glucose, UA: NEGATIVE mg/dL
Hgb urine dipstick: NEGATIVE
Ketones, ur: 20 mg/dL — AB
Nitrite: POSITIVE — AB
Protein, ur: 100 mg/dL — AB
Specific Gravity, Urine: 1.015 (ref 1.005–1.030)
WBC, UA: >50 WBC/hpf (ref 0–5)
pH: 6 (ref 5.0–8.0)

## 2023-02-19 LAB — BASIC METABOLIC PANEL
Anion gap: 12 (ref 5–15)
BUN: 9 mg/dL (ref 6–20)
CO2: 26 mmol/L (ref 22–32)
Calcium: 8.8 mg/dL — ABNORMAL LOW (ref 8.9–10.3)
Chloride: 97 mmol/L — ABNORMAL LOW (ref 98–111)
Creatinine, Ser: 0.93 mg/dL (ref 0.44–1.00)
GFR, Estimated: >60 mL/min (ref >60)
Glucose, Bld: 125 mg/dL — ABNORMAL HIGH (ref 70–99)
Potassium: 3.7 mmol/L (ref 3.5–5.1)
Sodium: 135 mmol/L (ref 135–145)

## 2023-02-19 LAB — CBC
HCT: 36.2 % (ref 36.0–46.0)
Hemoglobin: 11.1 g/dL — ABNORMAL LOW (ref 12.0–15.0)
MCH: 25.2 pg — ABNORMAL LOW (ref 26.0–34.0)
MCHC: 30.7 g/dL (ref 30.0–36.0)
MCV: 82.3 fL (ref 80.0–100.0)
Platelets: 334 10*3/uL (ref 150–400)
RBC: 4.4 MIL/uL (ref 3.87–5.11)
RDW: 15.4 % (ref 11.5–15.5)
WBC: 13.5 10*3/uL — ABNORMAL HIGH (ref 4.0–10.5)
nRBC: 0 % (ref 0.0–0.2)

## 2023-02-19 MED ORDER — ONDANSETRON HCL 4 MG/2ML IJ SOLN
4.0000 mg | Freq: Once | INTRAMUSCULAR | Status: AC
  Administered 2023-02-19: 4 mg via INTRAVENOUS
  Filled 2023-02-19: qty 2

## 2023-02-19 MED ORDER — ONDANSETRON 4 MG PO TBDP
4.0000 mg | ORAL_TABLET | Freq: Once | ORAL | Status: AC | PRN
  Administered 2023-02-19: 4 mg via ORAL
  Filled 2023-02-19: qty 1

## 2023-02-19 MED ORDER — LACTATED RINGERS IV BOLUS
1000.0000 mL | Freq: Once | INTRAVENOUS | Status: AC
  Administered 2023-02-19: 1000 mL via INTRAVENOUS

## 2023-02-19 MED ORDER — CEPHALEXIN 500 MG PO CAPS: 500.0000 mg | ORAL_CAPSULE | Freq: Two times a day (BID) | ORAL | 0 refills | Status: DC

## 2023-02-19 MED ORDER — ONDANSETRON 4 MG PO TBDP: 4.0000 mg | ORAL_TABLET | Freq: Three times a day (TID) | ORAL | 0 refills | Status: DC | PRN

## 2023-02-19 MED ORDER — SODIUM CHLORIDE 0.9 % IV SOLN
1.0000 g | Freq: Once | INTRAVENOUS | Status: AC
  Administered 2023-02-19: 1 g via INTRAVENOUS
  Filled 2023-02-19: qty 10

## 2023-02-19 NOTE — ED Triage Notes (Signed)
Pt to ed from home c/o lower, central back pain associated with emesis and diarrhea. Pt states has urine urgency this past week. Pt denies fever

## 2023-02-19 NOTE — ED Notes (Signed)
Pt unable to give urine sample at this time. Pt is aware that a sample is needed.

## 2023-02-19 NOTE — ED Provider Notes (Signed)
[place] EMERGENCY DEPARTMENT AT [facility]  Provider Note  CSN: [id number] Arrival date & time: 02/19/23 0126  History Chief Complaint  Patient presents with   Back Pain   Urinary Frequency    Melinda Moore is a 52 y.o. female with history of MS and UTI has had some dysuria for the last week or so, treated at home with AZO, but not getting better so she went to her PCP on the morning of 9/9 and had a UA concerning for UTI. Given Macrobid, but she has been having persistent vomiting and was unable to keep any of it down today. She has not had a fever. Some lower abdomen and back pain, but she also has chronic back pain and is unsure which this pain is.    Home Medications Prior to Admission medications   Medication Sig Start Date End Date Taking? Authorizing Provider  cephALEXin (KEFLEX) 500 MG capsule Take 1 capsule (500 mg total) by mouth 2 (two) times daily for 7 days. 02/19/23 02/26/23 Yes Pollyann Savoy, MD  ondansetron (ZOFRAN-ODT) 4 MG disintegrating tablet Take 1 tablet (4 mg total) by mouth every 8 (eight) hours as needed for nausea or vomiting. 02/19/23  Yes Pollyann Savoy, MD  ascorbic acid (VITAMIN C) 500 MG tablet Take 1 tablet (500 mg total) by mouth daily. 02/15/20   Johnson, Clanford L, MD  baclofen (LIORESAL) 10 MG tablet Take 10 mg by mouth 2 (two) times daily. 03/09/22   [provider]  Bioflavonoid Products (BIOFLEX) TABS Take 2 tablets by mouth daily.    [provider]  buPROPion (WELLBUTRIN SR) 150 MG 12 hr tablet TAKE ONE TABLET BY MOUTH TWICE A DAY 10/01/22   Babs Sciara, MD  carbamazepine (TEGRETOL XR) 200 MG 12 hr tablet Take 1 tablet (200 mg total) by mouth 3 (three) times daily as needed. 11/07/22   Babs Sciara, MD  Cholecalciferol (VITAMIN D3) 125 MCG (5000 UT) TABS Take 20,000 Units by mouth 2 (two) times daily.    [provider]  gabapentin (NEURONTIN) 300 MG capsule Take 1,200 mg by mouth 3 (three) times  daily. 02/04/21   [provider]  HYDROmorphone (DILAUDID) 4 MG tablet 1 tablet po qid prn, max use per day is 4 tablets 02/08/23   Babs Sciara, MD  HYDROmorphone (DILAUDID) 4 MG tablet 1 qid prn pain caution drowsiness 02/08/23   Luking, Scott A, MD  HYDROmorphone (DILAUDID) 4 MG tablet 1 tablet po qid prn, max use per day is 4 tablets. 02/08/23   Babs Sciara, MD  Multiple Vitamin (MULTIVITAMIN WITH MINERALS) TABS tablet Take 1 tablet by mouth daily. ALIVE    [provider]  Multiple Vitamins-Minerals (EMERGEN-C IMMUNE) PACK Take 1 packet by mouth once a week.    [provider]  Naldemedine Tosylate (SYMPROIC) 0.2 MG TABS TAKE ONE TABLET (0.2MG ) BY MOUTH DAILY 09/25/22   Aida Raider, NP  naloxone [facility]) 4 MG/0.1ML LIQD nasal spray kit SPRAY INTO NOSE AS DIRECTED FOR ACCIDENTAL OVERDOSE 05/19/22   Babs Sciara, MD  nitrofurantoin, macrocrystal-monohydrate, (MACROBID) 100 MG capsule Take 1 capsule (100 mg total) by mouth 2 (two) times daily. 02/18/23   Cook, Jayce G, DO  Ofatumumab (KESIMPTA) 20 MG/0.4ML SOAJ Inject into the skin. 10/09/21   [provider]  pantoprazole (PROTONIX) 40 MG tablet TAKE ONE TABLET BY MOUTH TWICE A DAY BEFORE A MEAL 10/22/22   Rourk, Gerrit Friends, MD  phenazopyridine (PYRIDIUM) 200 MG tablet Take 1 tablet (200 mg total) by mouth 3 (three) times daily as needed for pain. 12/06/22   Domenick Gong, MD  phentermine (ADIPEX-P) 37.5 MG tablet Take 1 tablet (37.5 mg total) by mouth daily before breakfast. 10/22/22   Campbell Riches, NP  potassium chloride SA (KLOR-CON M) 20 MEQ tablet 1 qd 11/07/22   Babs Sciara, MD  sertraline (ZOLOFT) 50 MG tablet Take 0.5 tablets (25 mg total) by mouth 2 (two) times daily. 11/07/22   Babs Sciara, MD  torsemide (DEMADEX) 20 MG tablet Take 1/2 to 1  tablet po q am prn pedal edema 11/07/22   Babs Sciara, MD  zinc sulfate 220 (50 Zn) MG capsule Take 1 capsule (220 mg total) by mouth daily.  02/15/20   Cleora Fleet, MD     Allergies    Ambien [zolpidem tartrate] and Tape   Review of Systems   Review of Systems Please see HPI for pertinent positives and negatives  Physical Exam BP 132/83 (BP Location: Right Arm)   Pulse 100   Temp 98.2 F (36.8 C) (Oral)   Resp 19   Ht 5\' 9"  (1.753 m)   Wt 118.8 kg   SpO2 99%   BMI 38.69 kg/m   Physical Exam Vitals and nursing note reviewed.  Constitutional:      Appearance: Normal appearance.  HENT:     Head: Normocephalic and atraumatic.     Nose: Nose normal.     Mouth/Throat:     Mouth: Mucous membranes are moist.  Eyes:     Extraocular Movements: Extraocular movements intact.     Conjunctiva/sclera: Conjunctivae normal.  Cardiovascular:     Rate and Rhythm: Normal rate.  Pulmonary:     Effort: Pulmonary effort is normal.     Breath sounds: Normal breath sounds.  Abdominal:     General: Abdomen is flat.     Palpations: Abdomen is soft.     Tenderness: There is no abdominal tenderness. There is no right CVA tenderness or left CVA tenderness.  Musculoskeletal:        General: No swelling. Normal range of motion.     Cervical back: Neck supple.  Skin:    General: Skin is warm and dry.  Neurological:     General: No focal deficit present.     Mental Status: She is alert.  Psychiatric:        Mood and Affect: Mood normal.     ED Results / Procedures / Treatments   EKG None  Procedures Procedures  Medications Ordered in the ED Medications  ondansetron (ZOFRAN) injection 4 mg (has no administration in time range)  ondansetron (ZOFRAN-ODT) disintegrating tablet 4 mg (4 mg Oral Given 02/19/23 0228)  lactated ringers bolus 1,000 mL (0 mLs Intravenous Stopped 02/19/23 0600)  ondansetron (ZOFRAN) injection 4 mg (4 mg Intravenous Given 02/19/23 0436)  cefTRIAXone (ROCEPHIN) 1 g in sodium chloride 0.9 % 100 mL IVPB (0 g Intravenous Stopped 02/19/23 0515)    Initial Impression and Plan  Patient here with  dysuria and vomiting. Maybe some flank pain to suggest ascending infection but no fever here. Labs done in triage show CBC with mild leukocytosis. BMP is unremarkable. UA is pending, but the sample done yesterday had large LE. No microscopic done. That sample was sent for culture but still in process. Prior urine culture was positive for e-coli. Will give IVF and antiemetics, plan to give a dose of  rocephin and reassess.   ED Course   Clinical Course as of 02/19/23 0640  Tue Feb 19, 2023  [id number] Patient's UA is in the lab, however I am planning to treat UTI regardless of result given findings at PCP office yesterday. She does not want to wait for the result but she is requesting one additional dose of Zofran before she goes home. Plan discharge with Rx for Zofran and Keflex. She can stop the Macrobid given concern for ascending infection. PCP follow up, RTED for any other concerns.   [CS]    Clinical Course User Index [CS] Pollyann Savoy, MD     MDM Rules/Calculators/A&P Medical Decision Making Problems Addressed: Cystitis: acute illness or injury  Amount and/or Complexity of Data Reviewed Labs: ordered. Decision-making details documented in ED Course.  Risk Prescription drug management.     Final Clinical Impression(s) / ED Diagnoses Final diagnoses:  Cystitis    Rx / DC Orders ED Discharge Orders          Ordered    cephALEXin (KEFLEX) 500 MG capsule  2 times daily        02/19/23 0640    ondansetron (ZOFRAN-ODT) 4 MG disintegrating tablet  Every 8 hours PRN        02/19/23 0640             Pollyann Savoy, MD 02/19/23 [phone]

## 2023-02-20 ENCOUNTER — Other Ambulatory Visit: Payer: Self-pay | Admitting: Family Medicine

## 2023-02-20 ENCOUNTER — Encounter: Payer: Self-pay | Admitting: Family Medicine

## 2023-02-20 MED ORDER — CEPHALEXIN 500 MG PO CAPS: ORAL_CAPSULE | ORAL | 0 refills | Status: DC

## 2023-02-20 MED ORDER — ONDANSETRON 8 MG PO TBDP: 8.0000 mg | ORAL_TABLET | Freq: Three times a day (TID) | ORAL | 1 refills | Status: DC | PRN

## 2023-02-21 LAB — SPECIMEN STATUS REPORT

## 2023-02-21 LAB — URINE CULTURE

## 2023-03-04 ENCOUNTER — Other Ambulatory Visit: Payer: Self-pay | Admitting: Internal Medicine

## 2023-03-04 ENCOUNTER — Encounter: Payer: Self-pay | Admitting: Family Medicine

## 2023-03-04 DIAGNOSIS — K219 Gastro-esophageal reflux disease without esophagitis: Secondary | ICD-10-CM

## 2023-03-14 NOTE — Telephone Encounter (Signed)
If there is an open slot somewhere over the next week with myself she may have this If she would like to come by the office to get a clean-catch urine for dipstick as well as microscopic that is possible as well If I do not have any openings perhaps Eber Jones does If there is still no openings in the next 10 days urgent care It would be very unusual for persistent infection to go on this long. She needs a follow-up office visit

## 2023-03-15 ENCOUNTER — Other Ambulatory Visit: Payer: Self-pay

## 2023-03-15 ENCOUNTER — Telehealth: Payer: Self-pay | Admitting: Family Medicine

## 2023-03-15 ENCOUNTER — Encounter: Payer: Self-pay | Admitting: Family Medicine

## 2023-03-15 ENCOUNTER — Telehealth (INDEPENDENT_AMBULATORY_CARE_PROVIDER_SITE_OTHER): Payer: 59 | Admitting: *Deleted

## 2023-03-15 DIAGNOSIS — R300 Dysuria: Secondary | ICD-10-CM

## 2023-03-15 LAB — POCT URINALYSIS DIPSTICK
Spec Grav, UA: 1.01 (ref 1.010–1.025)
pH, UA: 6 (ref 5.0–8.0)

## 2023-03-15 NOTE — Telephone Encounter (Signed)
Spoke with patient she will stop by today for a urine sample and has an appt with Ms Eber Jones on 03/20/23

## 2023-03-15 NOTE — Telephone Encounter (Signed)
Patient left urine sample  Urine dipped and spun- see results

## 2023-03-15 NOTE — Telephone Encounter (Signed)
Patient came by office and left urine sample, Requesting refill on Zofran and wanting antibiotic called into [facility]

## 2023-03-15 NOTE — Telephone Encounter (Signed)
See other phone message with results

## 2023-03-16 ENCOUNTER — Other Ambulatory Visit: Payer: Self-pay | Admitting: Family Medicine

## 2023-03-16 ENCOUNTER — Encounter: Payer: Self-pay | Admitting: Family Medicine

## 2023-03-16 MED ORDER — ONDANSETRON 8 MG PO TBDP: 8.0000 mg | ORAL_TABLET | Freq: Three times a day (TID) | ORAL | 1 refills | Status: DC | PRN

## 2023-03-16 MED ORDER — CIPROFLOXACIN HCL 500 MG PO TABS: 500.0000 mg | ORAL_TABLET | Freq: Two times a day (BID) | ORAL | 0 refills | Status: DC

## 2023-03-16 NOTE — Telephone Encounter (Signed)
Antibiotics Cipro plus Zofran sent in, MyChart message sent to patient, she has a follow-up visit this coming week, culture sent await results

## 2023-03-16 NOTE — Telephone Encounter (Signed)
Medications were sent in MyChart message sent culture sent patient has an appointment coming up

## 2023-03-19 ENCOUNTER — Ambulatory Visit: Payer: 59 | Admitting: Internal Medicine

## 2023-03-19 LAB — URINE CULTURE

## 2023-03-19 LAB — SPECIMEN STATUS REPORT

## 2023-03-20 ENCOUNTER — Ambulatory Visit: Payer: 59 | Admitting: Nurse Practitioner

## 2023-03-20 ENCOUNTER — Telehealth: Payer: Self-pay

## 2023-03-20 VITALS — BP 116/82 | HR 88 | Temp 98.0°F | Ht 69.0 in | Wt 245.8 lb

## 2023-03-20 DIAGNOSIS — Z23 Encounter for immunization: Secondary | ICD-10-CM

## 2023-03-20 DIAGNOSIS — N390 Urinary tract infection, site not specified: Secondary | ICD-10-CM

## 2023-03-20 DIAGNOSIS — A499 Bacterial infection, unspecified: Secondary | ICD-10-CM

## 2023-03-20 MED ORDER — FLUCONAZOLE 150 MG PO TABS
ORAL_TABLET | ORAL | 0 refills | Status: DC
Start: 2023-03-20 — End: 2023-05-08

## 2023-03-20 NOTE — Progress Notes (Unsigned)
Subjective:    Patient ID: Melinda Moore, female    DOB: [birth year], 52 y.o.   MRN: [id number]  HPI Patient presents for complaints of persistent urinary symptoms that began on 02/18/2023.  Has been seen several times since then.  Because her symptoms were persistent, was prescribed Cipro twice a day for 7 days on 03/16/2023.  Since then things have greatly improved.  Was having incontinence, this has resolved.  Frequency and urgency have improved.  No lower abdominal pain.  No flank pain or CVA pain.  No nausea or vomiting.  Taking fluids well.  No further dysuria.  No fever.  Overall much improved.  No vaginal discharge.  No new sexual partners or concerns about STD.        Objective:   Physical Exam NAD.  Alert, oriented.  Lungs clear.  Heart regular rate rhythm.  No CVA tenderness noted to percussion.  Abdomen soft nondistended nontender. Urine culture dated 02/18/2023 was positive for E. coli. Second urine culture dated 03/15/2023 was also positive for E. coli.  Because patient did not respond to the first 2 antibiotics Cipro was prescribed. Today's Vitals   03/20/23 1459  BP: 116/82  Pulse: 88  Temp: 98 F (36.7 C)  SpO2: 98%  Weight: 245 lb 12.8 oz (111.5 kg)  Height: 5\' 9"  (1.753 m)   Body mass index is 36.3 kg/m.      Assessment & Plan:   Problem List Items Addressed This Visit       Genitourinary   UTI (urinary tract infection), bacterial - Primary   Relevant Medications   fluconazole (DIFLUCAN) 150 MG tablet   Other Visit Diagnoses     Immunization due       Relevant Orders   Flu vaccine trivalent PF, 6mos and older(Flulaval,Afluria,Fluarix,Fluzone) (Completed)      Meds ordered this encounter  Medications   fluconazole (DIFLUCAN) 150 MG tablet    Sig: One po qd prn yeast infection; may repeat in 3-4 days if needed. May take once Cipro is complete    Dispense:  2 tablet    Refill:  0    Order Specific Question:   Supervising Provider    Answer:   Babs Sciara [phone]   Prescription given for Diflucan since patient tends to get yeast infections with antibiotics, reminded patient not to take this until Cipro is complete.  With the recent history of recurrent issues, patient to let us know if she needs a refill on the Cipro.  Continue clear fluid intake.  Warning signs reviewed.  Call back if worsens or persist.

## 2023-03-20 NOTE — Telephone Encounter (Signed)
Transition Care Management Unsuccessful Follow-up Telephone Call  Date of discharge and from where:  [facility]  Attempts:  1st Attempt  Reason for unsuccessful TCM follow-up call:  No answer/busy   Lenard Forth [place]  [facility], [facility] Guide, Phone: [phone] Website: [email or web address]

## 2023-03-21 ENCOUNTER — Telehealth: Payer: Self-pay

## 2023-03-21 ENCOUNTER — Encounter: Payer: Self-pay | Admitting: Nurse Practitioner

## 2023-03-21 ENCOUNTER — Other Ambulatory Visit: Payer: Self-pay | Admitting: Nurse Practitioner

## 2023-03-21 DIAGNOSIS — N390 Urinary tract infection, site not specified: Secondary | ICD-10-CM | POA: Insufficient documentation

## 2023-03-21 MED ORDER — CIPROFLOXACIN HCL 500 MG PO TABS: 500.0000 mg | ORAL_TABLET | Freq: Two times a day (BID) | ORAL | 0 refills | Status: DC

## 2023-03-21 NOTE — Telephone Encounter (Signed)
Transition Care Management Unsuccessful Follow-up Telephone Call  Date of discharge and from where:  Melinda Moore 9/10  Attempts:  2nd Attempt  Reason for unsuccessful TCM follow-up call:  No answer/busy   Lenard Forth [place]  [facility], [facility] Guide, Phone: [phone] Website: [email or web address]

## 2023-03-25 ENCOUNTER — Other Ambulatory Visit (HOSPITAL_COMMUNITY): Payer: Self-pay | Admitting: Family Medicine

## 2023-03-25 DIAGNOSIS — Z1231 Encounter for screening mammogram for malignant neoplasm of breast: Secondary | ICD-10-CM

## 2023-04-02 ENCOUNTER — Encounter: Payer: Self-pay | Admitting: *Deleted

## 2023-04-02 ENCOUNTER — Other Ambulatory Visit (INDEPENDENT_AMBULATORY_CARE_PROVIDER_SITE_OTHER): Payer: Self-pay

## 2023-04-02 ENCOUNTER — Telehealth: Payer: Self-pay | Admitting: *Deleted

## 2023-04-02 ENCOUNTER — Ambulatory Visit (INDEPENDENT_AMBULATORY_CARE_PROVIDER_SITE_OTHER): Payer: Medicare Other | Admitting: Internal Medicine

## 2023-04-02 VITALS — BP 102/69 | HR 101 | Temp 97.8°F | Ht 69.0 in | Wt 247.0 lb

## 2023-04-02 DIAGNOSIS — K219 Gastro-esophageal reflux disease without esophagitis: Secondary | ICD-10-CM

## 2023-04-02 DIAGNOSIS — N390 Urinary tract infection, site not specified: Secondary | ICD-10-CM

## 2023-04-02 DIAGNOSIS — R634 Abnormal weight loss: Secondary | ICD-10-CM | POA: Diagnosis not present

## 2023-04-02 DIAGNOSIS — R112 Nausea with vomiting, unspecified: Secondary | ICD-10-CM | POA: Diagnosis not present

## 2023-04-02 MED ORDER — VOQUEZNA 20 MG PO TABS: 20.0000 mg | ORAL_TABLET | Freq: Every day | ORAL | 11 refills | Status: DC

## 2023-04-02 NOTE — Patient Instructions (Addendum)
It was good seeing you again today!  Taking pantoprazole at high dose is not helping  Stop pantoprazole or Protonix entirely.  Trial of Voquenza 20 mg tablet take 1 daily 2-week supply samples provided.  Dispense 30 with 11 refills  Let me know how this medication is working for you in 2 to 3 weeks  GERD information provided  Because of weight loss nausea,  vomiting,  worsening reflux symptoms, we are going to pursue an diagnostic upper endoscopy in the near future.  ASA 3  Plan to have a Cologuard checked every 3 years  Further recommendations to follow.

## 2023-04-02 NOTE — Progress Notes (Unsigned)
Primary Care Physician:  Babs Sciara, MD Primary Gastroenterologist:  Dr. Jena Gauss  Pre-Procedure History & Physical: HPI:  Melinda Moore is a 52 y.o. female here  for follow-up.  She has had issues with multiple recurrent urinary tract infection requiring antibiotics.  She has had associated nausea vomiting worsening of reflux symptoms.  She tells me she has been taking Protonix 40 mg pills to twice a day.  Have not helped reflux much no dysphagia.  No hematemesis hematochezia or melena.  Unable to prep her colon previously for screening.  She did have a Cologuard recently which was negative.  283 back in April of this year; now weighs 235.  Reports her MS is stable.  Takes Symproic for constipation-working well.  Past Medical History:  Diagnosis Date   Anemia    Anxiety    Bell's palsy    Depression    DVT (deep venous thrombosis) (HCC) 09/02/2012   [place] on 02/13/2012 showed acute DVT in left calf veins and posterior tibial veins   Fibromyalgia    GERD (gastroesophageal reflux disease)    History of blood transfusion 2000   History of trigeminal neuralgia    HTN (hypertension)    patient denies was taking a blood pressure medication in early 2000 but it was migraines   Leukopenia    MS (multiple sclerosis) (HCC) 1997   Neuropathic pain    Peripheral edema    Port catheter in place 12/31/2012   Right bundle branch block     Past Surgical History:  Procedure Laterality Date   ABDOMINAL HYSTERECTOMY     CHOLECYSTECTOMY     COLONOSCOPY WITH PROPOFOL N/A 09/27/2021   Procedure: COLONOSCOPY WITH PROPOFOL;  Surgeon: Corbin Ade, MD;  Location: [facility];  Service: Endoscopy;  Laterality: N/A;  8:00am, asa 2   ESOPHAGOGASTRODUODENOSCOPY  2011   Dr. Jena Gauss. 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 Biopsy from the stomach revealed minimal chronic inactive inflammation but negative for  H. pylori   FLEXIBLE SIGMOIDOSCOPY N/A 02/08/2022   Procedure: FLEXIBLE SIGMOIDOSCOPY;  Surgeon: Corbin Ade, MD;  Location: [facility];  Service: Endoscopy;  Laterality: N/A;   LAPAROSCOPIC GASTRIC SLEEVE RESECTION N/A 04/17/2016   Procedure: LAPAROSCOPIC GASTRIC SLEEVE RESECTION WITH UPPER ENDOSCOPY;  Surgeon: Glenna Fellows, MD;  Location: [facility];  Service: General;  Laterality: N/A;   PATELLA-FEMORAL ARTHROPLASTY Right 07/14/2019   Procedure: PATELLA-FEMORAL ARTHROPLASTY;  Surgeon: Teryl Lucy, MD;  Location: [facility];  Service: Orthopedics;  Laterality: Right;   PORTACATH PLACEMENT     TUBAL LIGATION  2001    Prior to Admission medications   Medication Sig Start Date End Date Taking? Authorizing Provider  ascorbic acid (VITAMIN C) 500 MG tablet Take 1 tablet (500 mg total) by mouth daily. 02/15/20  Yes Johnson, Clanford L, MD  baclofen (LIORESAL) 10 MG tablet Take 10 mg by mouth 2 (two) times daily. 03/09/22  Yes [provider]  Bioflavonoid Products (BIOFLEX) TABS Take 2 tablets by mouth daily.   Yes [provider]  buPROPion (WELLBUTRIN SR) 150 MG 12 hr tablet TAKE ONE TABLET BY MOUTH TWICE A DAY 10/01/22  Yes Luking, Jonna Coup, MD  carbamazepine (TEGRETOL XR) 200 MG 12 hr tablet Take 1 tablet (200 mg total) by mouth 3 (three) times daily as needed. 11/07/22  Yes Babs Sciara, MD  Cholecalciferol (VITAMIN D3) 125 MCG (5000 UT) TABS Take 20,000  Units by mouth 2 (two) times daily.   Yes [provider]  ciprofloxacin (CIPRO) 500 MG tablet Take 1 tablet (500 mg total) by mouth 2 (two) times daily. 03/21/23  Yes Campbell Riches, NP  fluconazole (DIFLUCAN) 150 MG tablet One po qd prn yeast infection; may repeat in 3-4 days if needed. May take once Cipro is complete 03/20/23  Yes Hoskins, Eber Jones C, NP  gabapentin (NEURONTIN) 300 MG capsule Take 1,200 mg by mouth 3 (three) times daily. 02/04/21  Yes [provider]  HYDROmorphone (DILAUDID) 4 MG  tablet 1 tablet po qid prn, max use per day is 4 tablets 02/08/23  Yes Luking, Scott A, MD  HYDROmorphone (DILAUDID) 4 MG tablet 1 qid prn pain caution drowsiness 02/08/23  Yes Luking, Scott A, MD  HYDROmorphone (DILAUDID) 4 MG tablet 1 tablet po qid prn, max use per day is 4 tablets. 02/08/23  Yes Babs Sciara, MD  Multiple Vitamin (MULTIVITAMIN WITH MINERALS) TABS tablet Take 1 tablet by mouth daily. ALIVE   Yes [provider]  Multiple Vitamins-Minerals (EMERGEN-C IMMUNE) PACK Take 1 packet by mouth once a week.   Yes [provider]  Naldemedine Tosylate (SYMPROIC) 0.2 MG TABS TAKE ONE TABLET (0.2MG ) BY MOUTH DAILY 09/25/22  Yes Mahon, Courtney L, NP  naloxone (NARCAN) 4 MG/0.1ML LIQD nasal spray kit SPRAY INTO NOSE AS DIRECTED FOR ACCIDENTAL OVERDOSE 05/19/22  Yes Luking, Jonna Coup, MD  Ofatumumab (KESIMPTA) 20 MG/0.4ML SOAJ Inject into the skin. 10/09/21  Yes [provider]  ondansetron (ZOFRAN-ODT) 8 MG disintegrating tablet Take 1 tablet (8 mg total) by mouth every 8 (eight) hours as needed for nausea or vomiting. 03/16/23  Yes Luking, Jonna Coup, MD  pantoprazole (PROTONIX) 40 MG tablet TAKE ONE TABLET BY MOUTH TWICE A DAY BEFORE A MEAL 03/04/23  Yes Mahon, Courtney L, NP  phenazopyridine (PYRIDIUM) 200 MG tablet Take 1 tablet (200 mg total) by mouth 3 (three) times daily as needed for pain. 12/06/22  Yes Domenick Gong, MD  potassium chloride SA (KLOR-CON M) 20 MEQ tablet 1 qd 11/07/22  Yes Luking, Jonna Coup, MD  sertraline (ZOLOFT) 50 MG tablet Take 0.5 tablets (25 mg total) by mouth 2 (two) times daily. 11/07/22  Yes Babs Sciara, MD  torsemide (DEMADEX) 20 MG tablet Take 1/2 to 1  tablet po q am prn pedal edema 11/07/22  Yes Luking, Scott A, MD  zinc sulfate 220 (50 Zn) MG capsule Take 1 capsule (220 mg total) by mouth daily. 02/15/20  Yes Johnson, Clanford L, MD  phentermine (ADIPEX-P) 37.5 MG tablet Take 1 tablet (37.5 mg total) by mouth daily before breakfast. Patient  not taking: Reported on 04/02/2023 10/22/22   Campbell Riches, NP    Allergies as of 04/02/2023 - Review Complete 04/02/2023  Allergen Reaction Noted   Ambien [zolpidem tartrate]  02/08/2014   Tape Itching and Rash 03/26/2011    Family History  Problem Relation Age of Onset   Healthy Mother    Heart disease Father    Hyperlipidemia Father    Congestive Heart Failure Father    Hypertension Sister    Colon polyps Sister    Colon polyps Sister    Colon polyps Sister    Colon polyps Sister    Cirrhosis Brother        etoh   Healthy Daughter    Healthy Daughter    Colon cancer Paternal Uncle     Social History   Socioeconomic History  Marital status: Married    Spouse name: Viviann Spare   Number of children: 2   Years of education: some college   Highest education level: Associate degree: occupational, Scientist, product/process development, or vocational program  Occupational History   Occupation: disabled  Tobacco Use   Smoking status: Former    Current packs/day: 0.00    Types: Cigarettes    Quit date: 1990    Years since quitting: 34.8   Smokeless tobacco: Never   Tobacco comments:    4 a day, quit in 1990  Vaping Use   Vaping status: Never Used  Substance and Sexual Activity   Alcohol use: No   Drug use: No   Sexual activity: Not Currently    Birth control/protection: Surgical  Other Topics Concern   Not on file  Social History Narrative   Lives at home with husband and 2 daughters. 3 grand daughters.    Right handed   Takes 1/2 caffeine pill per day   Social Determinants of Health   Financial Resource Strain: Low Risk  (09/20/2022)   Overall Financial Resource Strain (CARDIA)    Difficulty of Paying Living Expenses: Not hard at all  Food Insecurity: No Food Insecurity (09/20/2022)   Hunger Vital Sign    Worried About Running Out of Food in the Last Year: Never true    Ran Out of Food in the Last Year: Never true  Transportation Needs: No Transportation Needs (09/20/2022)   PRAPARE  - Administrator, Civil Service (Medical): No    Lack of Transportation (Non-Medical): No  Physical Activity: Inactive (09/20/2022)   Exercise Vital Sign    Days of Exercise per Week: 0 days    Minutes of Exercise per Session: 0 min  Stress: No Stress Concern Present (09/20/2022)   [employer] of Occupational Health - Occupational Stress Questionnaire    Feeling of Stress : Not at all  Social Connections: Socially Integrated (09/20/2022)   Social Connection and Isolation Panel [NHANES]    Frequency of Communication with Friends and Family: More than three times a week    Frequency of Social Gatherings with Friends and Family: Once a week    Attends Religious Services: More than 4 times per year    Active Member of [employer] or Organizations: Yes    Attends Banker Meetings: Patient declined    Marital Status: Married  Catering manager Violence: Not At Risk (06/22/2022)   Humiliation, Afraid, Rape, and Kick questionnaire    Fear of Current or Ex-Partner: No    Emotionally Abused: No    Physically Abused: No    Sexually Abused: No    Review of Systems: See HPI, otherwise negative ROS  Physical Exam: BP 102/69 (BP Location: Left Arm, Patient Position: Sitting, Cuff Size: Large)   Pulse (!) 101   Temp 97.8 F (36.6 C) (Temporal)   Ht 5\' 9"  (1.753 m)   Wt 247 lb (112 kg)   BMI 36.48 kg/m  General:   Alert,  Well-developed, well-nourished, pleasant and cooperative in NAD Abdomen: Non-distended, normal bowel sounds.  Soft and nontender without appreciable mass or hepatosplenomegaly.   Impression/Plan: 52 year old morbidly obese lady with 68-month history of intermittent nausea, vomiting, significant worsening of reflux symptoms denies a odynophagia or dysphagia.  Taking excessive doses of pantoprazole without much improvement in symptoms.  She has lost 35 pounds or so by her scales which is of concern.  Recently associated recurrent urinary tract infections with  antibiotics which she cites  contributing to nausea and some anorexia.  It has been 13 years since she had an EGD.  Given her presentation I have recommended we take another look in her upper GI tract.  Recommendations:  Stop pantoprazole or Protonix entirely.  Trial of Voquenza 20 mg tablet take 1 daily 2-week supply samples provided.  Dispense 30 with 11 refills  Let me know how this medication is working for you in 2 to 3 weeks  GERD information provided  Because of weight loss nausea,  vomiting,  worsening reflux symptoms, we are going to pursue an diagnostic upper endoscopy in the near future.  ASA 3.  The risks, benefits, limitations, alternatives and imponderables have been reviewed with the patient. Potential for esophageal dilation, biopsy, etc. have also been reviewed.  Questions have been answered. All parties agreeable.   Plan to have a Cologuard checked every 3 years  Further recommendations to follow.   Notice: This dictation was prepared with Dragon dictation along with smaller phrase technology. Any transcriptional errors that result from this process are unintentional and may not be corrected upon review.

## 2023-04-02 NOTE — Telephone Encounter (Signed)
[employer] PA ID # [id number]: Notification or Prior Authorization is not required for the requested services This [employer] member's plan does not currently require a prior authorization for these services. If you have general questions about the prior authorization requirements, please call us at [phone] or visit [email or web address] > Clinician Resources > Advance and Admission Notification Requirements. The number above acknowledges your notification. Please write this number down for future reference. Notification is not a guarantee of coverage or payment. Decision ID #: [id number]  [employer]HC PA ID # [id number]: Notification or Prior Authorization is not required for the requested services You are not required to submit a notification/prior authorization based on the information provided. If you have general questions about the prior authorization requirements, visit [email or web address] > Clinician Resources > Advance and Admission Notification Requirements. The number above acknowledges your notification. Please write this reference number down for future reference. If you would like to request an organization determination, please call us at [phone]. Decision ID #: [id number]

## 2023-04-03 ENCOUNTER — Ambulatory Visit (HOSPITAL_COMMUNITY)
Admission: RE | Admit: 2023-04-03 | Discharge: 2023-04-03 | Disposition: A | Discharge status: Home / Self Care | Payer: 59 | Source: Ambulatory Visit | Attending: Family Medicine | Admitting: Family Medicine

## 2023-04-03 ENCOUNTER — Encounter (HOSPITAL_COMMUNITY): Payer: Self-pay

## 2023-04-03 DIAGNOSIS — Z1231 Encounter for screening mammogram for malignant neoplasm of breast: Secondary | ICD-10-CM | POA: Insufficient documentation

## 2023-04-04 ENCOUNTER — Telehealth: Payer: Self-pay

## 2023-04-04 NOTE — Telephone Encounter (Signed)
Voquezna was denied by [employer]. Pt must try and fail lansoprazole, rabeprazole and dexlansoprazole.

## 2023-04-10 ENCOUNTER — Other Ambulatory Visit: Payer: Self-pay

## 2023-04-10 MED ORDER — DEXLANSOPRAZOLE 60 MG PO CPDR: 60.0000 mg | DELAYED_RELEASE_CAPSULE | Freq: Every day | ORAL | 11 refills | Status: DC

## 2023-04-10 NOTE — Telephone Encounter (Signed)
Rx was sent to pharmacy and pt was made aware.

## 2023-04-11 ENCOUNTER — Encounter: Payer: Self-pay | Admitting: Family Medicine

## 2023-04-12 ENCOUNTER — Encounter: Payer: Self-pay | Admitting: Internal Medicine

## 2023-04-15 ENCOUNTER — Other Ambulatory Visit: Payer: Self-pay

## 2023-04-15 MED ORDER — LANSOPRAZOLE 30 MG PO CPDR: 30.0000 mg | DELAYED_RELEASE_CAPSULE | Freq: Two times a day (BID) | ORAL | 11 refills | Status: DC

## 2023-04-16 ENCOUNTER — Other Ambulatory Visit: Payer: Self-pay | Admitting: Family Medicine

## 2023-04-16 MED ORDER — HYDROMORPHONE HCL 4 MG PO TABS: ORAL_TABLET | ORAL | 0 refills | Status: DC

## 2023-05-06 NOTE — Patient Instructions (Signed)
Melinda Moore  05/06/2023     @PREFPERIOPPHARMACY @   Your procedure is scheduled on  05/08/2023.   Report to [facility] at  1230 P.M.    Call this number if you have problems the morning of surgery:  [phone]  If you experience any cold or flu symptoms such as cough, fever, chills, shortness of breath, etc. between now and your scheduled surgery, please notify us at the above number.   Remember:  Follow the diet and prep instructions given to you by the office.   You may drink clear liquids until 1030 am on 05/08/2023.    Clear liquids allowed are:                    Water, Juice (No red color; non-citric and without pulp; diabetics please choose diet or no sugar options), Carbonated beverages (diabetics please choose diet or no sugar options), Clear Tea (No creamer, milk, or cream, including half & half and powdered creamer), Black Coffee Only (No creamer, milk or cream, including half & half and powdered creamer), and Clear Sports drink (No red color; diabetics please choose diet or no sugar options)    Take these medicines the morning of surgery with A SIP OF WATER       baclofen, bupropion, carbamazepine, gabapentin, dilaudid, lansoprazole, sertraline.     Do not wear jewelry, make-up or nail polish, including gel polish,  artificial nails, or any other type of covering on natural nails (fingers and  toes).  Do not wear lotions, powders, or perfumes, or deodorant.  Do not shave 48 hours prior to surgery.  Men may shave face and neck.  Do not bring valuables to the hospital.  [facility] is not responsible for any belongings or valuables.  Contacts, dentures or bridgework may not be worn into surgery.  Leave your suitcase in the car.  After surgery it may be brought to your room.  For patients admitted to the hospital, discharge time will be determined by your treatment team.  Patients discharged the day of surgery will not be allowed to drive home and must have  someone with them for 24 hours.    Special instructions:   DO NOT smoke tobacco or vape for 24 hours before your procedure.  Please read over the following fact sheets that you were given. Anesthesia Post-op Instructions and Care and Recovery After Surgery      Upper Endoscopy, Adult, Care After After the procedure, it is common to have a sore throat. It is also common to have: Mild stomach pain or discomfort. Bloating. Nausea. Follow these instructions at home: The instructions below may help you care for yourself at home. Your health care provider may give you more instructions. If you have questions, ask your health care provider. If you were given a sedative during the procedure, it can affect you for several hours. Do not drive or operate machinery until your health care provider says that it is safe. If you will be going home right after the procedure, plan to have a responsible adult: Take you home from the hospital or clinic. You will not be allowed to drive. Care for you for the time you are told. Follow instructions from your health care provider about what you may eat and drink. Return to your normal activities as told by your health care provider. Ask your health care provider what activities are safe for you. Take over-the-counter and prescription medicines  only as told by your health care provider. Contact a health care provider if you: Have a sore throat that lasts longer than one day. Have trouble swallowing. Have a fever. Get help right away if you: Vomit blood or your vomit looks like coffee grounds. Have bloody, black, or tarry stools. Have a very bad sore throat or you cannot swallow. Have difficulty breathing or very bad pain in your chest or abdomen. These symptoms may be an emergency. Get help right away. Call 911. Do not wait to see if the symptoms will go away. Do not drive yourself to the hospital. Summary After the procedure, it is common to have a sore  throat, mild stomach discomfort, bloating, and nausea. If you were given a sedative during the procedure, it can affect you for several hours. Do not drive until your health care provider says that it is safe. Follow instructions from your health care provider about what you may eat and drink. Return to your normal activities as told by your health care provider. This information is not intended to replace advice given to you by your health care provider. Make sure you discuss any questions you have with your health care provider. Document Revised: 09/06/2021 Document Reviewed: 09/06/2021 Elsevier Patient Education  2024 Elsevier Inc. Monitored Anesthesia Care, Care After The following information offers guidance on how to care for yourself after your procedure. Your health care provider may also give you more specific instructions. If you have problems or questions, contact your health care provider. What can I expect after the procedure? After the procedure, it is common to have: Tiredness. Little or no memory about what happened during or after the procedure. Impaired judgment when it comes to making decisions. Nausea or vomiting. Some trouble with balance. Follow these instructions at home: For the time period you were told by your health care provider:  Rest. Do not participate in activities where you could fall or become injured. Do not drive or use machinery. Do not drink alcohol. Do not take sleeping pills or medicines that cause drowsiness. Do not make important decisions or sign legal documents. Do not take care of children on your own. Medicines Take over-the-counter and prescription medicines only as told by your health care provider. If you were prescribed antibiotics, take them as told by your health care provider. Do not stop using the antibiotic even if you start to feel better. Eating and drinking Follow instructions from your health care provider about what you may  eat and drink. Drink enough fluid to keep your urine pale yellow. If you vomit: Drink clear fluids slowly and in small amounts as you are able. Clear fluids include water, ice chips, low-calorie sports drinks, and fruit juice that has water added to it (diluted fruit juice). Eat light and bland foods in small amounts as you are able. These foods include bananas, applesauce, rice, lean meats, toast, and crackers. General instructions  Have a responsible adult stay with you for the time you are told. It is important to have someone help care for you until you are awake and alert. If you have sleep apnea, surgery and some medicines can increase your risk for breathing problems. Follow instructions from your health care provider about wearing your sleep device: When you are sleeping. This includes during daytime naps. While taking prescription pain medicines, sleeping medicines, or medicines that make you drowsy. Do not use any products that contain nicotine or tobacco. These products include cigarettes, chewing tobacco, and vaping  devices, such as e-cigarettes. If you need help quitting, ask your health care provider. Contact a health care provider if: You feel nauseous or vomit every time you eat or drink. You feel light-headed. You are still sleepy or having trouble with balance after 24 hours. You get a rash. You have a fever. You have redness or swelling around the IV site. Get help right away if: You have trouble breathing. You have new confusion after you get home. These symptoms may be an emergency. Get help right away. Call 911. Do not wait to see if the symptoms will go away. Do not drive yourself to the hospital. This information is not intended to replace advice given to you by your health care provider. Make sure you discuss any questions you have with your health care provider. Document Revised: 10/23/2021 Document Reviewed: 10/23/2021 Elsevier Patient Education  2024 [employer].

## 2023-05-07 ENCOUNTER — Encounter (HOSPITAL_COMMUNITY)
Admission: RE | Admit: 2023-05-07 | Discharge: 2023-05-07 | Disposition: A | Discharge status: Home / Self Care | Payer: 59 | Source: Ambulatory Visit | Attending: Internal Medicine

## 2023-05-07 ENCOUNTER — Encounter (HOSPITAL_COMMUNITY): Payer: Self-pay

## 2023-05-07 VITALS — BP 119/67 | HR 89 | Temp 97.7°F | Resp 18 | Ht 69.0 in | Wt 246.9 lb

## 2023-05-07 DIAGNOSIS — I4510 Unspecified right bundle-branch block: Secondary | ICD-10-CM | POA: Insufficient documentation

## 2023-05-07 DIAGNOSIS — Z01818 Encounter for other preprocedural examination: Secondary | ICD-10-CM | POA: Diagnosis present

## 2023-05-07 DIAGNOSIS — Z6836 Body mass index (BMI) 36.0-36.9, adult: Secondary | ICD-10-CM | POA: Insufficient documentation

## 2023-05-07 DIAGNOSIS — R9431 Abnormal electrocardiogram [ECG] [EKG]: Secondary | ICD-10-CM | POA: Diagnosis not present

## 2023-05-07 DIAGNOSIS — Z01810 Encounter for preprocedural cardiovascular examination: Secondary | ICD-10-CM | POA: Diagnosis not present

## 2023-05-07 DIAGNOSIS — I10 Essential (primary) hypertension: Secondary | ICD-10-CM | POA: Diagnosis not present

## 2023-05-07 DIAGNOSIS — E66812 Obesity, class 2: Secondary | ICD-10-CM | POA: Insufficient documentation

## 2023-05-08 ENCOUNTER — Encounter (HOSPITAL_COMMUNITY)
Admission: RE | Disposition: A | Discharge status: Home / Self Care | Payer: Self-pay | Source: Home / Self Care | Attending: Internal Medicine

## 2023-05-08 ENCOUNTER — Ambulatory Visit (HOSPITAL_COMMUNITY)
Admission: RE | Admit: 2023-05-08 | Discharge: 2023-05-08 | Disposition: A | Discharge status: Home / Self Care | Payer: 59 | Attending: Internal Medicine | Admitting: Internal Medicine

## 2023-05-08 ENCOUNTER — Encounter (HOSPITAL_COMMUNITY): Payer: Self-pay | Admitting: Internal Medicine

## 2023-05-08 ENCOUNTER — Ambulatory Visit (HOSPITAL_COMMUNITY): Payer: 59 | Admitting: Anesthesiology

## 2023-05-08 ENCOUNTER — Encounter: Payer: Self-pay | Admitting: Family Medicine

## 2023-05-08 ENCOUNTER — Ambulatory Visit (INDEPENDENT_AMBULATORY_CARE_PROVIDER_SITE_OTHER): Payer: 59 | Admitting: Family Medicine

## 2023-05-08 VITALS — BP 101/66 | HR 85 | Temp 97.2°F | Ht 69.0 in | Wt 244.0 lb

## 2023-05-08 DIAGNOSIS — G35 Multiple sclerosis: Secondary | ICD-10-CM

## 2023-05-08 DIAGNOSIS — R112 Nausea with vomiting, unspecified: Secondary | ICD-10-CM | POA: Insufficient documentation

## 2023-05-08 DIAGNOSIS — K3189 Other diseases of stomach and duodenum: Secondary | ICD-10-CM | POA: Diagnosis not present

## 2023-05-08 DIAGNOSIS — M797 Fibromyalgia: Secondary | ICD-10-CM | POA: Diagnosis not present

## 2023-05-08 DIAGNOSIS — K449 Diaphragmatic hernia without obstruction or gangrene: Secondary | ICD-10-CM | POA: Diagnosis not present

## 2023-05-08 DIAGNOSIS — I10 Essential (primary) hypertension: Secondary | ICD-10-CM | POA: Diagnosis not present

## 2023-05-08 DIAGNOSIS — Z87891 Personal history of nicotine dependence: Secondary | ICD-10-CM | POA: Diagnosis not present

## 2023-05-08 DIAGNOSIS — K259 Gastric ulcer, unspecified as acute or chronic, without hemorrhage or perforation: Secondary | ICD-10-CM | POA: Insufficient documentation

## 2023-05-08 DIAGNOSIS — F32A Depression, unspecified: Secondary | ICD-10-CM | POA: Insufficient documentation

## 2023-05-08 DIAGNOSIS — Z79891 Long term (current) use of opiate analgesic: Secondary | ICD-10-CM

## 2023-05-08 DIAGNOSIS — F419 Anxiety disorder, unspecified: Secondary | ICD-10-CM | POA: Insufficient documentation

## 2023-05-08 DIAGNOSIS — K219 Gastro-esophageal reflux disease without esophagitis: Secondary | ICD-10-CM | POA: Diagnosis not present

## 2023-05-08 DIAGNOSIS — Z79899 Other long term (current) drug therapy: Secondary | ICD-10-CM | POA: Diagnosis not present

## 2023-05-08 DIAGNOSIS — F439 Reaction to severe stress, unspecified: Secondary | ICD-10-CM

## 2023-05-08 DIAGNOSIS — R634 Abnormal weight loss: Secondary | ICD-10-CM

## 2023-05-08 HISTORY — PX: ESOPHAGOGASTRODUODENOSCOPY (EGD) WITH PROPOFOL: SHX5813

## 2023-05-08 HISTORY — PX: BIOPSY: SHX5522

## 2023-05-08 SURGERY — ESOPHAGOGASTRODUODENOSCOPY (EGD) WITH PROPOFOL
Anesthesia: General

## 2023-05-08 MED ORDER — HYDROMORPHONE HCL 4 MG PO TABS: ORAL_TABLET | ORAL | 0 refills | Status: DC

## 2023-05-08 MED ORDER — LIDOCAINE HCL 1 % IJ SOLN
INTRAMUSCULAR | Status: DC | PRN
  Administered 2023-05-08: 50 mg via INTRADERMAL

## 2023-05-08 MED ORDER — PROPOFOL 10 MG/ML IV BOLUS
INTRAVENOUS | Status: DC | PRN
  Administered 2023-05-08: 5 mg via INTRAVENOUS

## 2023-05-08 MED ORDER — LACTATED RINGERS IV SOLN: INTRAVENOUS | Status: DC | PRN

## 2023-05-08 NOTE — Transfer of Care (Signed)
Immediate Anesthesia Transfer of Care Note  Patient: Melinda Moore  Procedure(s) Performed: ESOPHAGOGASTRODUODENOSCOPY (EGD) WITH PROPOFOL BIOPSY  Patient Location: Short Stay  Anesthesia Type:General  Level of Consciousness: awake  Airway & Oxygen Therapy: Patient Spontanous Breathing  Post-op Assessment: Report given to RN  Post vital signs: Reviewed  Last Vitals:  Vitals Value Taken Time  BP 87/55 05/08/23 1255  Temp 36.8 C 05/08/23 1255  Pulse 83 05/08/23 1255  Resp 13 05/08/23 1255  SpO2 98 % 05/08/23 1255    Last Pain:  Vitals:   05/08/23 1255  TempSrc: Axillary  PainSc: 0-No pain      Patients Stated Pain Goal: (P) 5 (05/08/23 1205)  Complications: No notable events documented.

## 2023-05-08 NOTE — Op Note (Signed)
[facility] Patient Name: Melinda Moore Procedure Date: 05/08/2023 12:00 PM MRN: [id number] Date of Birth: Aug 19, 1970 Attending MD: Gennette Pac , MD, [id number] CSN: [id number] Age: 52 Admit Type: Outpatient Procedure:                Upper GI endoscopy Indications:              Nausea with vomiting Providers:                Gennette Pac, MD, Buel Ream. Thomasena Edis RN, RN,                            Dyann Ruddle Referring MD:              Medicines:                Propofol per Anesthesia Complications:            No immediate complications. Estimated Blood Loss:     Estimated blood loss was minimal. Procedure:                Pre-Anesthesia Assessment:                           - Prior to the procedure, a History and Physical                            was performed, and patient medications and                            allergies were reviewed. The patient's tolerance of                            previous anesthesia was also reviewed. The risks                            and benefits of the procedure and the sedation                            options and risks were discussed with the patient.                            All questions were answered, and informed consent                            was obtained. Prior Anticoagulants: The patient has                            taken no anticoagulant or antiplatelet agents. ASA                            Grade Assessment: III - A patient with severe                            systemic disease. After reviewing the risks and  benefits, the patient was deemed in satisfactory                            condition to undergo the procedure.                           After obtaining informed consent, the endoscope was                            passed under direct vision. Throughout the                            procedure, the patient's blood pressure, pulse, and                            oxygen  saturations were monitored continuously. The                            [id number]-[id number] ([id number]) scope was introduced through the                            mouth, and advanced to the second part of duodenum.                            The upper GI endoscopy was accomplished without                            difficulty. The patient tolerated the procedure                            well. Scope In: 12:45:04 PM Scope Out: 12:49:35 PM Total Procedure Duration: 0 hours 4 minutes 31 seconds  Findings:      The examined esophagus was normal. Stomach empty.      A medium-sized hiatal hernia was present. Linear antral erythema and       superficial erosions in the antrum. No ulcer or infiltrating process       seen. Patent pylorus.      The duodenal bulb and second portion of the duodenum were normal.       Biopsies of the gastric antrum taken for histologic study Impression:               - Normal esophagus.                           - Medium-sized hiatal hernia. 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                           -It is notable that lansoprazole twice daily has                            been associated with marked improvement, nearly                            resolution, of her intermittent nausea and vomiting Moderate Sedation:      Moderate (conscious) sedation was personally administered by an       anesthesia professional. The following parameters were monitored: oxygen       saturation, heart rate, blood pressure, respiratory rate, EKG, adequacy       of pulmonary ventilation, and response to care. Recommendation:           - Patient has a contact number available for                            emergencies. The signs and symptoms of potential                            delayed  complications were discussed with the                            patient. Return to normal activities tomorrow.                            Written discharge instructions were provided to the                            patient.                           - Advance diet as tolerated.                           - Continue present medications.                           - Return to my office in 3 months. Follow-up on                            pathology. Procedure Code(s):        --- Professional ---                           [phone], Esophagogastroduodenoscopy, flexible,                            transoral; diagnostic, including collection of                            specimen(s) by brushing or washing, when performed                            (  separate procedure) Diagnosis Code(s):        --- Professional ---                           K44.9, Diaphragmatic hernia without obstruction or                            gangrene                           R11.2, Nausea with vomiting, unspecified CPT copyright 2022 American Medical Association. All rights reserved. The codes documented in this report are preliminary and upon coder review may  be revised to meet current compliance requirements. Gerrit Friends. Crystalle Popwell, MD Gennette Pac, MD 05/08/2023 12:59:51 PM This report has been signed electronically. Number of Addenda: 0

## 2023-05-08 NOTE — Anesthesia Postprocedure Evaluation (Signed)
Anesthesia Post Note  Patient: Melinda Moore  Procedure(s) Performed: ESOPHAGOGASTRODUODENOSCOPY (EGD) WITH PROPOFOL BIOPSY  Patient location during evaluation: Short Stay Anesthesia Type: General Level of consciousness: awake and alert Pain management: pain level controlled Vital Signs Assessment: post-procedure vital signs reviewed and stable Respiratory status: spontaneous breathing Cardiovascular status: blood pressure returned to baseline and stable Postop Assessment: no apparent nausea or vomiting Anesthetic complications: no   No notable events documented.   Last Vitals:  Vitals:   05/08/23 1205 05/08/23 1255  BP: (P) 103/62 (!) 87/55  Pulse: (P) 86 83  Resp: (P) 18 13  Temp: (!) (P) 36.3 C 36.8 C  SpO2: (P) 99% 98%    Last Pain:  Vitals:   05/08/23 1255  TempSrc: Axillary  PainSc: 0-No pain                 Zoey Gilkeson

## 2023-05-08 NOTE — Discharge Instructions (Addendum)
EGD Discharge instructions Please read the instructions outlined below and refer to this sheet in the next few weeks. These discharge instructions provide you with general information on caring for yourself after you leave the hospital. Your doctor may also give you specific instructions. While your treatment has been planned according to the most current medical practices available, unavoidable complications occasionally occur. If you have any problems or questions after discharge, please call your doctor. ACTIVITY You may resume your regular activity but move at a slower pace for the next 24 hours.  Take frequent rest periods for the next 24 hours.  Walking will help expel (get rid of) the air and reduce the bloated feeling in your abdomen.  No driving for 24 hours (because of the anesthesia (medicine) used during the test).  You may shower.  Do not sign any important legal documents or operate any machinery for 24 hours (because of the anesthesia used during the test).  NUTRITION Drink plenty of fluids.  You may resume your normal diet.  Begin with a light meal and progress to your normal diet.  Avoid alcoholic beverages for 24 hours or as instructed by your caregiver.  MEDICATIONS You may resume your normal medications unless your caregiver tells you otherwise.  WHAT YOU CAN EXPECT TODAY You may experience abdominal discomfort such as a feeling of fullness or "gas" pains.  FOLLOW-UP Your doctor will discuss the results of your test with you.  SEEK IMMEDIATE MEDICAL ATTENTION IF ANY OF THE FOLLOWING OCCUR: Excessive nausea (feeling sick to your stomach) and/or vomiting.  Severe abdominal pain and distention (swelling).  Trouble swallowing.  Temperature over 101 F (37.8 C).  Rectal bleeding or vomiting of blood.      you have a hiatal hernia mild inflammation in your stomach.  Biopsies taken.    Continue taking Prevacid or lansoprazole 30 mg twice daily before meals   further  recommendations to follow pending review of pathology report   office visit with me in 3 months  At patient request, I called Mackensie Billen at [phone] reviewed findings and recommendations

## 2023-05-08 NOTE — Progress Notes (Signed)
Subjective:    Patient ID: Melinda Moore, female    DOB: Nov 06, 1970, 52 y.o.   MRN: [id number]  HPI This patient was seen today for chronic pain  The medication list was reviewed and updated.   Location of Pain for which the patient has been treated with regarding narcotics: Location of Pain for which the patient has been treated with regarding narcotics: Back pain sciatica pain right side, patient also with MS and MS related pain she has been on opioids for years was started by her neurologist who no longer is in practice we have been treating this chronic pain for several year  Onset of this pain: Years ago   -Compliance with medication: Good compliance  - Number patient states they take daily: 4/day  -Reason for ongoing use of opioids chronic low back pain see discussion above  What other measures have been tried outside of opioids NSAIDs, Tylenol, gabapentin  In the ongoing specialists regarding this condition has seen specialist previously  -when was the last dose patient took?  Earlier today  The patient was advised the importance of maintaining medication and not using illegal substances with these.  Here for refills and follow up  The patient was educated that we can provide 3 monthly scripts for their medication, it is their responsibility to follow the instructions.  Side effects or complications from medications: Denies side effects  Patient is aware that pain medications are meant to minimize the severity of the pain to allow their pain levels to improve to allow for better function. They are aware of that pain medications cannot totally remove their pain.  Due for UDT ( at least once per year) (pain management contract is also completed at the time of the UDT): Up-to-date for this year  Scale of 1 to 10 ( 1 is least 10 is most) Your pain level without the medicine: 10 Your pain level with medication 6  Scale 1 to 10 ( 1-helps very little, 10 helps very well) How  well does your pain medication reduce your pain so you can function better through out the day?  8  Quality of the pain: Throbbing aching  Persistence of the pain: Present all the time  Modifying factors: Worse with activity    Patient states she has EGD coming up because of unintended weight loss    Review of Systems     Objective:   Physical Exam  General-in no acute distress Eyes-no discharge Lungs-respiratory rate normal, CTA CV-no murmurs,RRR Extremities skin warm dry no edema Neuro grossly normal Behavior normal, alert       Assessment & Plan:  1. Encounter for long-term opiate analgesic use The patient was seen in followup for chronic pain. A review over at their current pain status was discussed. Drug registry was checked. Prescriptions were given.  Regular follow-up recommended. Discussion was held regarding the importance of compliance with medication as well as pain medication contract.  Patient was informed that medication may cause drowsiness and should not be combined  with other medications/alcohol or street drugs. If the patient feels medication is causing altered alertness then do not drive or operate dangerous equipment.  Should be noted that the patient appears to be meeting appropriate use of opioids and response.  Evidenced by improved function and decent pain control without significant side effects and no evidence of overt aberrancy issues.  Upon discussion with the patient today they understand that opioid therapy is optional and they feel that the  pain has been refractory to reasonable conservative measures and is significant and affecting quality of life enough to warrant ongoing therapy and wishes to continue opioids.  Refills were provided.  [facility] medical Board guidelines regarding the pain medicine has been reviewed.  CDC guidelines most updated 2022 has been reviewed by the prescriber.  PDMP is checked on a regular basis yearly urine  drug screen and pain management contract   2. Stress Continue current medication healthy diet  3. MS (multiple sclerosis) (HCC) Followed by specialist periodically  4. Morbid obesity (HCC) Portion control regular activity  5. Weight loss Unintended weight loss recently being followed by gastroenterology, EGD coming up

## 2023-05-08 NOTE — H&P (Signed)
@LOGO @   Primary Care Physician:  Babs Sciara, MD Primary Gastroenterologist:  Dr.   Pre-Procedure History & Physical: HPI:  Melinda Moore is a 52 y.o. female here for  further evaluation of nausea vomiting history of MS.  Longstanding GERD.  Much better on lansoprazole 30 mg twice daily.  No dysphagia.  Past Medical History:  Diagnosis Date   Anemia    Anxiety    Bell's palsy    Depression    DVT (deep venous thrombosis) (HCC) 09/02/2012   [place] on 02/13/2012 showed acute DVT in left calf veins and posterior tibial veins   Fibromyalgia    GERD (gastroesophageal reflux disease)    History of blood transfusion 2000   History of trigeminal neuralgia    HTN (hypertension)    patient denies was taking a blood pressure medication in early 2000 but it was migraines   Leukopenia    MS (multiple sclerosis) (HCC) 1997   Neuropathic pain    Peripheral edema    Port catheter in place 12/31/2012   Right bundle branch block     Past Surgical History:  Procedure Laterality Date   ABDOMINAL HYSTERECTOMY     CHOLECYSTECTOMY     COLONOSCOPY WITH PROPOFOL N/A 09/27/2021   Procedure: COLONOSCOPY WITH PROPOFOL;  Surgeon: Corbin Ade, MD;  Location: [facility];  Service: Endoscopy;  Laterality: N/A;  8:00am, asa 2   ESOPHAGOGASTRODUODENOSCOPY  2011   Dr. Jena Gauss. 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 Biopsy from the stomach revealed minimal chronic inactive inflammation but negative for H. pylori   FLEXIBLE SIGMOIDOSCOPY N/A 02/08/2022   Procedure: FLEXIBLE SIGMOIDOSCOPY;  Surgeon: Corbin Ade, MD;  Location: [facility];  Service: Endoscopy;  Laterality: N/A;   LAPAROSCOPIC GASTRIC SLEEVE RESECTION N/A 04/17/2016   Procedure: LAPAROSCOPIC GASTRIC SLEEVE RESECTION WITH UPPER ENDOSCOPY;  Surgeon: Glenna Fellows, MD;  Location: [facility];  Service: General;  Laterality: N/A;   PATELLA-FEMORAL  ARTHROPLASTY Right 07/14/2019   Procedure: PATELLA-FEMORAL ARTHROPLASTY;  Surgeon: Teryl Lucy, MD;  Location: [facility];  Service: Orthopedics;  Laterality: Right;   PORTACATH PLACEMENT     TUBAL LIGATION  2001    Prior to Admission medications   Medication Sig Start Date End Date Taking? Authorizing Provider  ascorbic acid (VITAMIN C) 500 MG tablet Take 1 tablet (500 mg total) by mouth daily. 02/15/20  Yes Johnson, Clanford L, MD  baclofen (LIORESAL) 10 MG tablet Take 10 mg by mouth 2 (two) times daily. 03/09/22  Yes [provider]  Bioflavonoid Products (BIOFLEX) TABS Take 2 tablets by mouth daily.   Yes [provider]  buPROPion (WELLBUTRIN SR) 150 MG 12 hr tablet TAKE ONE TABLET BY MOUTH TWICE A DAY 10/01/22  Yes Luking, Jonna Coup, MD  carbamazepine (TEGRETOL XR) 200 MG 12 hr tablet Take 1 tablet (200 mg total) by mouth 3 (three) times daily as needed. 11/07/22  Yes Babs Sciara, MD  Cholecalciferol (VITAMIN D3) 125 MCG (5000 UT) TABS Take 20,000 Units by mouth 2 (two) times daily.   Yes [provider]  gabapentin (NEURONTIN) 300 MG capsule Take 1,200 mg by mouth 3 (three) times daily. 02/04/21  Yes [provider]  HYDROmorphone (DILAUDID) 4 MG tablet 1 qid prn pain caution drowsiness 02/08/23  Yes Luking, Scott A, MD  lansoprazole (PREVACID) 30 MG capsule Take 1 capsule (30 mg total) by mouth 2 (two) times daily before a  meal. 04/15/23  Yes Aveleen Nevers, Gerrit Friends, MD  Multiple Vitamin (MULTIVITAMIN WITH MINERALS) TABS tablet Take 1 tablet by mouth daily. ALIVE   Yes [provider]  Multiple Vitamins-Minerals (EMERGEN-C IMMUNE) PACK Take 1 packet by mouth once a week.   Yes [provider]  Naldemedine Tosylate (SYMPROIC) 0.2 MG TABS TAKE ONE TABLET (0.2MG ) BY MOUTH DAILY 09/25/22  Yes Mahon, Courtney L, NP  Ofatumumab (KESIMPTA) 20 MG/0.4ML SOAJ Inject into the skin. 10/09/21  Yes [provider]  ondansetron (ZOFRAN-ODT) 8 MG  disintegrating tablet Take 1 tablet (8 mg total) by mouth every 8 (eight) hours as needed for nausea or vomiting. 03/16/23  Yes Babs Sciara, MD  phenazopyridine (PYRIDIUM) 200 MG tablet Take 1 tablet (200 mg total) by mouth 3 (three) times daily as needed for pain. 12/06/22  Yes Domenick Gong, MD  potassium chloride SA (KLOR-CON M) 20 MEQ tablet 1 qd 11/07/22  Yes Luking, Jonna Coup, MD  sertraline (ZOLOFT) 50 MG tablet Take 0.5 tablets (25 mg total) by mouth 2 (two) times daily. 11/07/22  Yes Babs Sciara, MD  torsemide (DEMADEX) 20 MG tablet Take 1/2 to 1  tablet po q am prn pedal edema 11/07/22  Yes Luking, Scott A, MD  zinc sulfate 220 (50 Zn) MG capsule Take 1 capsule (220 mg total) by mouth daily. 02/15/20  Yes Johnson, Clanford L, MD  HYDROmorphone (DILAUDID) 4 MG tablet 1 tablet po qid prn, max use per day is 4 tablets. 02/08/23   Babs Sciara, MD  HYDROmorphone (DILAUDID) 4 MG tablet 1 tablet po qid prn, max use per day is 4 tablets 04/16/23   Babs Sciara, MD  naloxone [facility]) 4 MG/0.1ML LIQD nasal spray kit SPRAY INTO NOSE AS DIRECTED FOR ACCIDENTAL OVERDOSE 05/19/22   Babs Sciara, MD    Allergies as of 04/02/2023 - Review Complete 04/02/2023  Allergen Reaction Noted   Ambien [zolpidem tartrate]  02/08/2014   Tape Itching and Rash 03/26/2011    Family History  Problem Relation Age of Onset   Healthy Mother    Heart disease Father    Hyperlipidemia Father    Congestive Heart Failure Father    Hypertension Sister    Colon polyps Sister    Colon polyps Sister    Colon polyps Sister    Colon polyps Sister    Cirrhosis Brother        etoh   Healthy Daughter    Healthy Daughter    Colon cancer Paternal Uncle     Social History   Socioeconomic History   Marital status: Married    Spouse name: Viviann Spare   Number of children: 2   Years of education: some college   Highest education level: Associate degree: occupational, Scientist, product/process development, or vocational program  Occupational  History   Occupation: disabled  Tobacco Use   Smoking status: Former    Current packs/day: 0.00    Types: Cigarettes    Quit date: 1990    Years since quitting: 34.9   Smokeless tobacco: Never   Tobacco comments:    4 a day, quit in 1990  Vaping Use   Vaping status: Never Used  Substance and Sexual Activity   Alcohol use: No   Drug use: No   Sexual activity: Not Currently    Birth control/protection: Surgical  Other Topics Concern   Not on file  Social History Narrative   Lives at home with husband and 2 daughters. 3 grand daughters.  Right handed   Takes 1/2 caffeine pill per day   Social Determinants of Health   Financial Resource Strain: Low Risk  (09/20/2022)   Overall Financial Resource Strain (CARDIA)    Difficulty of Paying Living Expenses: Not hard at all  Food Insecurity: No Food Insecurity (09/20/2022)   Hunger Vital Sign    Worried About Running Out of Food in the Last Year: Never true    Ran Out of Food in the Last Year: Never true  Transportation Needs: No Transportation Needs (09/20/2022)   PRAPARE - Administrator, Civil Service (Medical): No    Lack of Transportation (Non-Medical): No  Physical Activity: Inactive (09/20/2022)   Exercise Vital Sign    Days of Exercise per Week: 0 days    Minutes of Exercise per Session: 0 min  Stress: No Stress Concern Present (09/20/2022)   [employer] of Occupational Health - Occupational Stress Questionnaire    Feeling of Stress : Not at all  Social Connections: Socially Integrated (09/20/2022)   Social Connection and Isolation Panel [NHANES]    Frequency of Communication with Friends and Family: More than three times a week    Frequency of Social Gatherings with Friends and Family: Once a week    Attends Religious Services: More than 4 times per year    Active Member of [employer] or Organizations: Yes    Attends Banker Meetings: Patient declined    Marital Status: Married  Catering manager  Violence: Not At Risk (06/22/2022)   Humiliation, Afraid, Rape, and Kick questionnaire    Fear of Current or Ex-Partner: No    Emotionally Abused: No    Physically Abused: No    Sexually Abused: No    Review of Systems: See HPI, otherwise negative ROS  Physical Exam: BP (P) 103/62   Pulse (P) 86   Temp (!) (P) 97.4 F (36.3 C) (Oral)   Resp (P) 18   SpO2 (P) 99%  General:   Alert,  Well-developed, well-nourished, pleasant and cooperative in NAD Neck:  Supple; no masses or thyromegaly. No significant cervical adenopathy. Lungs:  Clear throughout to auscultation.   No wheezes, crackles, or rhonchi. No acute distress. Heart:  Regular rate and rhythm; no murmurs, clicks, rubs,  or gallops. Abdomen: Non-distended, normal bowel sounds.  Soft and nontender without appreciable mass or hepatosplenomegaly.   Impression/Plan:    52 year old lady with MS and recurrent nausea and vomiting.  Now on lansoprazole -  doing great!  No dysphagia.  Diagnostic EGD today per plan.   The risks, benefits, limitations, alternatives and imponderables have been reviewed with the patient. Potential for esophageal dilation, biopsy, etc. have also been reviewed.  Questions have been answered. All parties agreeable.      Notice: This dictation was prepared with Dragon dictation along with smaller phrase technology. Any transcriptional errors that result from this process are unintentional and may not be corrected upon review.

## 2023-05-08 NOTE — Anesthesia Preprocedure Evaluation (Addendum)
Anesthesia Evaluation  Patient identified by MRN, date of birth, ID band Patient awake    Reviewed: Allergy & Precautions, H&P , NPO status , Patient's Chart, lab work & pertinent test results, reviewed documented beta blocker date and time   Airway Mallampati: II  TM Distance: >3 FB Neck ROM: full    Dental no notable dental hx. (+) Dental Advisory Given, Teeth Intact   Pulmonary former smoker   Pulmonary exam normal breath sounds clear to auscultation       Cardiovascular Exercise Tolerance: Good hypertension, Normal cardiovascular exam Rhythm:regular Rate:Normal  RBBB/ peripheral edema   Neuro/Psych  Headaches PSYCHIATRIC DISORDERS Anxiety Depression    Multiple Sclerosis.  Bells palsy.  Trigeminal neuralgia  Neuromuscular disease    GI/Hepatic Neg liver ROS,GERD  Medicated,,  Endo/Other    Renal/GU negative Renal ROS  negative genitourinary   Musculoskeletal  (+) Arthritis , Osteoarthritis,  Fibromyalgia -  Abdominal  (+) + obese  Peds  Hematology  (+) Blood dyscrasia, anemia   Anesthesia Other Findings DVT. Class 2 obese  Reproductive/Obstetrics negative OB ROS                             Anesthesia Physical Anesthesia Plan  ASA: 3  Anesthesia Plan: General   Post-op Pain Management: Minimal or no pain anticipated   Induction: Intravenous  PONV Risk Score and Plan: Propofol infusion  Airway Management Planned: Nasal Cannula and Natural Airway  Additional Equipment: None  Intra-op Plan:   Post-operative Plan:   Informed Consent: I have reviewed the patients History and Physical, chart, labs and discussed the procedure including the risks, benefits and alternatives for the proposed anesthesia with the patient or authorized representative who has indicated his/her understanding and acceptance.     Dental Advisory Given  Plan Discussed with: CRNA  Anesthesia Plan  Comments:         Anesthesia Quick Evaluation

## 2023-05-10 ENCOUNTER — Encounter: Payer: Self-pay | Admitting: Internal Medicine

## 2023-05-10 LAB — SURGICAL PATHOLOGY

## 2023-05-13 ENCOUNTER — Ambulatory Visit: Payer: 59 | Admitting: Family Medicine

## 2023-05-14 DIAGNOSIS — M5416 Radiculopathy, lumbar region: Secondary | ICD-10-CM | POA: Diagnosis not present

## 2023-05-16 ENCOUNTER — Encounter (HOSPITAL_COMMUNITY): Payer: Self-pay | Admitting: Internal Medicine

## 2023-05-30 ENCOUNTER — Encounter: Payer: Self-pay | Admitting: Family Medicine

## 2023-05-30 ENCOUNTER — Ambulatory Visit: Payer: 59 | Admitting: Family Medicine

## 2023-05-30 VITALS — BP 114/73 | HR 90 | Temp 97.7°F | Ht 69.0 in | Wt 247.6 lb

## 2023-05-30 DIAGNOSIS — J019 Acute sinusitis, unspecified: Secondary | ICD-10-CM

## 2023-05-30 MED ORDER — AMOXICILLIN 500 MG PO CAPS: ORAL_CAPSULE | ORAL | 0 refills | Status: DC

## 2023-05-30 NOTE — Progress Notes (Signed)
   Subjective:    Patient ID: Melinda Moore, female    DOB: 07/03/1970, 52 y.o.   MRN: [id number]  Discussed the use of AI scribe software for clinical note transcription with the patient, who gave verbal consent to proceed.  History of Present Illness   The patient, with a history of laryngitis, presents with symptoms that started a week ago with nasal congestion. This was followed by a productive cough that started on Monday and has persisted since. The patient denies any associated fever or chills. She has tested negative for COVID-19.  The patient's grandchildren, who live with her, have been sick with similar symptoms. The grandchildren were diagnosed with a viral infection at an urgent care center. The patient is concerned about the persistence of her symptoms, particularly as she does not want to have laryngitis for Christmas.  The patient also reports soreness in the sinus area. She has been using a humidifier at home for symptom relief. The patient's voice has improved since Tuesday, from a whisper to a soft voice, but she still experiences fatigue when talking due to the effort required. The patient denies any respiratory distress, and her lungs were reported to sound clear on examination.      She does describe sinus pressure pain and discomfort over the past few days  Review of Systems     Objective:    Physical Exam   CHEST: Lungs clear to auscultation. CARDIOVASCULAR: Heart sounds normal.     Gen-NAD not toxic TMS-normal bilateral T- normal no redness Chest-CTA respiratory rate normal no crackles CV RRR no murmur Skin-warm dry Neuro-grossly normal   on physical exam patient has tenderness in the maxillary sinuses worse on the left than the right     Assessment & Plan:  Assessment and Plan    Laryngitis Likely viral in origin, with improvement noted since onset. Advised to limit voice use to expedite recovery. -Expect resolution in 3-4 days.  Sinusitis Likely  secondary to initial viral infection. Tenderness noted over sinuses. -Prescribe a course of antibiotics. -Advise use of a humidifier at home.  Follow-up If voice is not significantly improved by next week or back to normal in two weeks, patient should provide an update.     Mild tenderness in sinuses Viral syndrome with secondary rhinosinusitis Laryngitis viral Antibiotics for the sinuses Follow-up if progressive troubles warnings discussed

## 2023-05-31 ENCOUNTER — Other Ambulatory Visit: Payer: Self-pay | Admitting: Nurse Practitioner

## 2023-05-31 ENCOUNTER — Other Ambulatory Visit: Payer: Self-pay | Admitting: Family Medicine

## 2023-05-31 MED ORDER — FLUCONAZOLE 150 MG PO TABS: ORAL_TABLET | ORAL | 0 refills | Status: DC

## 2023-06-01 ENCOUNTER — Other Ambulatory Visit: Payer: Self-pay | Admitting: Family Medicine

## 2023-06-14 ENCOUNTER — Encounter: Payer: Self-pay | Admitting: Nurse Practitioner

## 2023-06-14 ENCOUNTER — Other Ambulatory Visit: Payer: Self-pay | Admitting: Family Medicine

## 2023-06-14 ENCOUNTER — Ambulatory Visit (INDEPENDENT_AMBULATORY_CARE_PROVIDER_SITE_OTHER): Payer: 59 | Admitting: Nurse Practitioner

## 2023-06-14 VITALS — BP 126/78 | HR 89 | Temp 97.3°F | Ht 69.0 in | Wt 252.0 lb

## 2023-06-14 DIAGNOSIS — B9689 Other specified bacterial agents as the cause of diseases classified elsewhere: Secondary | ICD-10-CM | POA: Diagnosis not present

## 2023-06-14 DIAGNOSIS — J069 Acute upper respiratory infection, unspecified: Secondary | ICD-10-CM | POA: Diagnosis not present

## 2023-06-14 DIAGNOSIS — R062 Wheezing: Secondary | ICD-10-CM

## 2023-06-14 MED ORDER — ALBUTEROL SULFATE HFA 108 (90 BASE) MCG/ACT IN AERS
2.0000 | INHALATION_SPRAY | RESPIRATORY_TRACT | 0 refills | Status: AC | PRN
Start: 2023-06-14 — End: ?

## 2023-06-14 MED ORDER — CEFDINIR 300 MG PO CAPS: 300.0000 mg | ORAL_CAPSULE | Freq: Two times a day (BID) | ORAL | 0 refills | Status: DC

## 2023-06-14 NOTE — Progress Notes (Signed)
   Subjective:    Patient ID: Melinda Moore, female    DOB: 01/29/1971, 53 y.o.   MRN: [id number]  HPI Presents for complaints of continued congestion after being seen on 05/30/2023 for sinusitis and viral infection.  Completed her course of amoxicillin .  Continues to have nasal congestion.  Coughing spells especially at nighttime.  Producing yellow-green mucus.  Some shortness of breath.  No fever.  No sore throat.  No ear pain but some right ear pressure.  Slight wheezing, chest tightness and shortness of breath with activity.  Has tried OTC products such as Alka-Seltzer plus Mucinex  Tylenol  and ibuprofen .  Has used albuterol  inhaler before but it has been a long time ago.  Non-smoker.  Social History   Tobacco Use   Smoking status: Former    Current packs/day: 0.00    Types: Cigarettes    Quit date: 1990    Years since quitting: 35.0   Smokeless tobacco: Never   Tobacco comments:    4 a day, quit in 1990  Vaping Use   Vaping status: Never Used  Substance Use Topics   Alcohol  use: No   Drug use: No         Objective:   Physical Exam NAD.  Alert, oriented.  TMs minimal clear effusion, no erythema.  Pharynx injected with PND noted.  Neck supple with mild soft anterior cervical adenopathy.  Lungs clear.  Heart regular rate rhythm.  No tachypnea.  Occasional congested cough noted. Today's Vitals   06/14/23 1521  BP: 126/78  Pulse: 89  Temp: (!) 97.3 F (36.3 C)  SpO2: 97%  Weight: 252 lb (114.3 kg)  Height: 5' 9 (1.753 m)   Body mass index is 37.21 kg/m.        Assessment & Plan:  Bacterial URI  Wheezing Meds ordered this encounter  Medications   cefdinir  (OMNICEF ) 300 MG capsule    Sig: Take 1 capsule (300 mg total) by mouth 2 (two) times daily.    Dispense:  14 capsule    Refill:  0    Supervising Provider:   ALPHONSA HAMILTON A [[id number]]   albuterol  (VENTOLIN  HFA) 108 (90 Base) MCG/ACT inhaler    Sig: Inhale 2 puffs into the lungs every 4 (four) hours as needed for  wheezing or shortness of breath.    Dispense:  8 g    Refill:  0    Supervising Provider:   ALPHONSA HAMILTON A [[id number]]   Start cefdinir  as directed.  Continue OTC meds as directed for symptomatic care.  Also given albuterol  inhaler as needed for wheezing.  Avoid exposure to extremely cold air since this may trigger her cough and wheezing.  Warning signs reviewed.  Call back next week if no improvement, sooner if worse.

## 2023-06-17 ENCOUNTER — Other Ambulatory Visit: Payer: Self-pay | Admitting: Nurse Practitioner

## 2023-06-26 ENCOUNTER — Encounter: Payer: Self-pay | Admitting: Family Medicine

## 2023-06-26 DIAGNOSIS — M5416 Radiculopathy, lumbar region: Secondary | ICD-10-CM | POA: Diagnosis not present

## 2023-06-26 NOTE — Telephone Encounter (Signed)
 Nurses Patient has back pain recently being treated with injections and medication Please go ahead with physical therapy referral to outpatient physical therapy on [address] [facility] across from [facility]

## 2023-06-27 ENCOUNTER — Other Ambulatory Visit: Payer: Self-pay

## 2023-06-27 DIAGNOSIS — M549 Dorsalgia, unspecified: Secondary | ICD-10-CM

## 2023-07-01 ENCOUNTER — Encounter: Payer: Self-pay | Admitting: Internal Medicine

## 2023-07-08 DIAGNOSIS — G35 Multiple sclerosis: Secondary | ICD-10-CM | POA: Diagnosis not present

## 2023-07-13 NOTE — Therapy (Signed)
 OUTPATIENT PHYSICAL THERAPY THORACOLUMBAR EVALUATION   Patient Name: Melinda Moore MRN: [id number] DOB:[birth year], 53 y.o., female Today's Date: 07/17/2023  END OF SESSION:  PT End of Session - 07/17/23 1349     Visit Number 1    Number of Visits 8    Date for PT Re-Evaluation 08/14/23    Authorization Type [employer]    PT Start Time 1300    PT Stop Time 1338    PT Time Calculation (min) 38 min    Activity Tolerance Patient tolerated treatment well    Behavior During Therapy WFL for tasks assessed/performed             Past Medical History:  Diagnosis Date   Anemia    Anxiety    Bell's palsy    Depression    DVT (deep venous thrombosis) (HCC) 09/02/2012   US  on 02/13/2012 showed acute DVT in left calf veins and posterior tibial veins   Fibromyalgia    GERD (gastroesophageal reflux disease)    History of blood transfusion 2000   History of trigeminal neuralgia    HTN (hypertension)    patient denies was taking a blood pressure medication in early 2000 but it was migraines   Leukopenia    MS (multiple sclerosis) (HCC) 1997   Neuropathic pain    Peripheral edema    Port catheter in place 12/31/2012   Right bundle branch block    Past Surgical History:  Procedure Laterality Date   ABDOMINAL HYSTERECTOMY     BIOPSY  05/08/2023   Procedure: BIOPSY;  Surgeon: Shaaron Lamar HERO, MD;  Location: [facility];  Service: Endoscopy;;   CHOLECYSTECTOMY     COLONOSCOPY WITH PROPOFOL  N/A 09/27/2021   Procedure: COLONOSCOPY WITH PROPOFOL ;  Surgeon: Shaaron Lamar HERO, MD;  Location: [facility];  Service: Endoscopy;  Laterality: N/A;  8:00am, asa 2   ESOPHAGOGASTRODUODENOSCOPY  2011   Dr. Shaaron. 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 Biopsy from the stomach revealed minimal chronic inactive inflammation but negative for H. pylori   ESOPHAGOGASTRODUODENOSCOPY (EGD) WITH PROPOFOL  N/A 05/08/2023    Procedure: ESOPHAGOGASTRODUODENOSCOPY (EGD) WITH PROPOFOL ;  Surgeon: Shaaron Lamar HERO, MD;  Location: [facility];  Service: Endoscopy;  Laterality: N/A;  2:30 pm, asa 3   FLEXIBLE SIGMOIDOSCOPY N/A 02/08/2022   Procedure: FLEXIBLE SIGMOIDOSCOPY;  Surgeon: Shaaron Lamar HERO, MD;  Location: [facility];  Service: Endoscopy;  Laterality: N/A;   LAPAROSCOPIC GASTRIC SLEEVE RESECTION N/A 04/17/2016   Procedure: LAPAROSCOPIC GASTRIC SLEEVE RESECTION WITH UPPER ENDOSCOPY;  Surgeon: Morene Olives, MD;  Location: [facility];  Service: General;  Laterality: N/A;   PATELLA-FEMORAL ARTHROPLASTY Right 07/14/2019   Procedure: PATELLA-FEMORAL ARTHROPLASTY;  Surgeon: Josefina Chew, MD;  Location: [facility];  Service: Orthopedics;  Laterality: Right;   PORTACATH PLACEMENT     TUBAL LIGATION  2001   Patient Active Problem List   Diagnosis Date Noted   UTI (urinary tract infection), bacterial 03/21/2023   Urinary frequency 02/18/2023   Chronic low back pain (1ry area of Pain) (Bilateral) (R>L) w/o sciatica 07/18/2022   Lumbar facet arthropathy (Bilateral) 07/18/2022   Lumbar facet joint pain 07/18/2022   Chronic lower extremity pain (2ry area of Pain) (Right) 07/18/2022   DDD (degenerative disc disease), lumbosacral 07/18/2022   Abnormal MRI, lumbar spine (10/11/2017) 07/18/2022   Lumbosacral foraminal stenosis (Left: L5-S1) 07/18/2022   Obesity, Class II, BMI 35-39.9 07/18/2022   Osteoarthritis of facet  joint of lumbar spine 07/18/2022   Primary osteoarthritis of lumbar spine 07/18/2022   History of total knee replacement (Right) 07/18/2022   Chronic knee pain s/p TKR (Right) 07/18/2022   Chronic medial knee pain (Right) 07/18/2022   Gastroesophageal reflux disease 09/07/2021   Hx of hysterectomy 06/14/2021   Patellofemoral arthritis of knee (Right) 07/14/2019   History of DVT in adulthood 06/23/2019   Radiculopathy due to lumbar intervertebral disc disorder 09/21/2016   Spondylosis without myelopathy or  radiculopathy, lumbar region 09/21/2016   Primary osteoarthritis of knee (Right) 08/30/2016   Primary insomnia 08/30/2016   Myofascial pain 08/30/2016   Chronic anxiety 07/08/2013   Chronic pain syndrome 03/11/2013   Port-A-Cath in place 12/31/2012   Constipation 10/01/2012   MS (multiple sclerosis) (HCC) 09/02/2012   Other dysphagia 04/12/2010    PCP: Alphonsa Glendia LABOR, MD  REFERRING PROVIDER: Alphonsa Glendia LABOR, MD  REFERRING DIAG: M54.9 (ICD-10-CM) - Back pain, unspecified back location, unspecified back pain laterality, unspecified chronicity  Rationale for Evaluation and Treatment: Rehabilitation  THERAPY DIAG:  Low back pain, unspecified back pain laterality, unspecified chronicity, unspecified whether sciatica present - Plan: PT plan of care cert/re-cert  Difficulty in walking, not elsewhere classified - Plan: PT plan of care cert/re-cert  Other abnormalities of gait and mobility - Plan: PT plan of care cert/re-cert  ONSET DATE: chronic  SUBJECTIVE:                                                                                                                                                                                           SUBJECTIVE STATEMENT: Patient well known to this clinic.  Same back stiffness; right leg pain; has not yet been able to start going to the gym or anything yet.  She had a kidney infection at the end of year and had to lay around a lot so she got stiff all over again.    PERTINENT HISTORY:  MS OA right knee  PAIN:  Are you having pain? Yes: NPRS scale: 8/10 Pain location: back and all the way down the front and lateral part of right leg Pain description: back aching and stiff; leg is numb and tingling Aggravating factors: walking and standing Relieving factors: pain medication , stretching, elevating legs  PRECAUTIONS: None  RED FLAGS: None   WEIGHT BEARING RESTRICTIONS: No  FALLS:  Has patient fallen in last 6 months?  No  OCCUPATION: not working  PLOF: Independent  PATIENT GOALS: stand up straight and walk without being bent over; get off pain medication  NEXT MD VISIT: 08/06/23  OBJECTIVE:  Note: Objective measures were completed at Evaluation  unless otherwise noted.  DIAGNOSTIC FINDINGS:  None recently  PATIENT SURVEYS:  Modified Oswestry 18/50  36%   COGNITION: Overall cognitive status: Within functional limits for tasks assessed     SENSATION: Burning and tingling right leg down to lateral ankle   POSTURE: rounded shoulders, forward head, and flexed trunk   PALPATION: General soreness lumbar spine  LUMBAR ROM:   AROM eval  Flexion Fingertips to ankle  Extension Unable to achieve full upright standing posture  Right lateral flexion To mid calf but is flexing at trunk  Left lateral flexion To mid calf but is flexin at trunk  Right rotation   Left rotation    (Blank rows = not tested)  LOWER EXTREMITY ROM:     Active  Right eval Left eval  Hip flexion    Hip extension    Hip abduction    Hip adduction    Hip internal rotation    Hip external rotation    Knee flexion    Knee extension    Ankle dorsiflexion    Ankle plantarflexion    Ankle inversion    Ankle eversion     (Blank rows = not tested)  LOWER EXTREMITY MMT:    MMT Right eval Left eval  Hip flexion 4 4+  Hip extension 3- 3 tends to rotate  Hip abduction    Hip adduction    Hip internal rotation    Hip external rotation    Knee flexion 4- 4  Knee extension 4+ 5  Ankle dorsiflexion 5 5  Ankle plantarflexion    Ankle inversion    Ankle eversion     (Blank rows = not tested)  FUNCTIONAL TESTS:  5 times sit to stand: 19.61 sec 2 minute walk test: 217 ft (stopped at 1 min and 20 sec due to pain and fatigue)  GAIT: Distance walked: 217 ft Assistive device utilized: None Level of assistance: Modified independence and SBA Comments: forward flexed trunk; decreased heel strike, increased lateral  trunk sway  TREATMENT DATE:  07/17/23 physical therapy evaluation and HEP instruction     Nustep seat 10 x 5'                                                                                                                             PATIENT EDUCATION:  Education details: Patient educated on exam findings, POC, scope of PT, HEP, and discussion of aquatic exercises; [employer]. Person educated: Patient Education method: Explanation, Demonstration, and Handouts Education comprehension: verbalized understanding, returned demonstration, verbal cues required, and tactile cues required  HOME EXERCISE PROGRAM: Access Code: [email or web address] URL: [email or web address] Date: 07/17/2023 Prepared by: [facility]  Exercises - Static Prone on Elbows  - 1 x daily - 7 x weekly - 1 sets - 1 reps - 1 min hold - Prone Press Up  - 1 x daily - 7 x weekly - 1 sets - 10 reps - Standing Lumbar Extension with Counter  -  1 x daily - 7 x weekly - 1 sets - 10 reps - Standing Lumbar Extension at Wall - Forearms  - 1 x daily - 7 x weekly - 1 sets - 10 reps  ASSESSMENT:  CLINICAL IMPRESSION: Patient is a 53 y.o. female who was seen today for physical therapy evaluation and treatment for M54.9 (ICD-10-CM) - Back pain, unspecified back location, unspecified back pain laterality, unspecified chronicity. Patient demonstrates muscle weakness, reduced ROM, and fascial restrictions which are likely contributing to symptoms of pain and are negatively impacting patient ability to perform ADLs and functional mobility tasks. Patient will benefit from skilled physical therapy services to address these deficits to reduce pain and improve level of function with ADLs and functional mobility tasks.   OBJECTIVE IMPAIRMENTS: Abnormal gait, decreased activity tolerance, decreased endurance, decreased mobility, difficulty walking, decreased ROM, decreased strength, hypomobility, increased fascial restrictions, impaired perceived  functional ability, impaired flexibility, postural dysfunction, and pain.   ACTIVITY LIMITATIONS: carrying, lifting, bending, sitting, standing, squatting, stairs, transfers, bed mobility, and locomotion level  PARTICIPATION LIMITATIONS: meal prep, cleaning, laundry, shopping, and community activity  PERSONAL FACTORS: Past/current experiences and 1 comorbidity: MS  are also affecting patient's functional outcome.   REHAB POTENTIAL: Good  CLINICAL DECISION MAKING: Evolving/moderate complexity  EVALUATION COMPLEXITY: Moderate   GOALS: Goals reviewed with patient? No  SHORT TERM GOALS: Target date: 07/31/2023  patient will be independent with initial HEP  Baseline: Goal status: INITIAL  2.  Patient will report 30% improvement overall  Baseline:  Goal status: INITIAL  LONG TERM GOALS: Target date: 08/14/22  Patient will be independent in self management strategies to improve quality of life and functional outcomes.  Baseline:  Goal status: INITIAL  2.  Patient will report 50% improvement overall  Baseline:  Goal status: INITIAL  3.  Patient will improve Modified Oswestry score by 8 points to demonstrate improved perceived function   Baseline: 18/50 Goal status: INITIAL  4.  Patient will improve 5 times sit to stand score to 15 sec or less to demonstrate improved functional mobility and increased leg strength.     Baseline: 19.61 sec Goal status: INITIAL  5.  Patient will increase distance on to 250 ft demonstrate improved functional mobility walking household and community distances.   Baseline: 217 ft Goal status: INITIAL  PLAN:  PT FREQUENCY: 2x/week  PT DURATION: 4 weeks  PLANNED INTERVENTIONS: [id number]- PT Re-evaluation, 97110-Therapeutic exercises, 97530- Therapeutic activity, 97112- Neuromuscular re-education, 97535- Self Care, [id number]- Manual therapy, [phone]- Gait training, [phone]- Orthotic Fit/training, [phone]- Canalith repositioning, [id number]- Aquatic  Therapy, [phone]- Splinting, Patient/Family education, Balance training, Stair training, Taping, Dry Needling, Joint mobilization, Joint manipulation, Spinal manipulation, Spinal mobilization, Scar mobilization, and DME instructions. SABRA  PLAN FOR NEXT SESSION: Review HEP and goals; postural and lower extremity strengthening; spinal extension  1:50 PM, 07/17/23 Samika Vetsch Small Sherrian Nunnelley MPT [facility]  [phone] Ph:[phone]

## 2023-07-17 ENCOUNTER — Ambulatory Visit (HOSPITAL_COMMUNITY): Payer: 59 | Attending: Family Medicine

## 2023-07-17 ENCOUNTER — Other Ambulatory Visit: Payer: Self-pay

## 2023-07-17 DIAGNOSIS — M549 Dorsalgia, unspecified: Secondary | ICD-10-CM | POA: Insufficient documentation

## 2023-07-17 DIAGNOSIS — G35 Multiple sclerosis: Secondary | ICD-10-CM | POA: Insufficient documentation

## 2023-07-17 DIAGNOSIS — R262 Difficulty in walking, not elsewhere classified: Secondary | ICD-10-CM | POA: Insufficient documentation

## 2023-07-17 DIAGNOSIS — M545 Low back pain, unspecified: Secondary | ICD-10-CM | POA: Diagnosis not present

## 2023-07-17 DIAGNOSIS — R2689 Other abnormalities of gait and mobility: Secondary | ICD-10-CM | POA: Insufficient documentation

## 2023-07-17 DIAGNOSIS — M6281 Muscle weakness (generalized): Secondary | ICD-10-CM | POA: Diagnosis not present

## 2023-07-20 ENCOUNTER — Encounter: Payer: Self-pay | Admitting: Internal Medicine

## 2023-07-31 ENCOUNTER — Encounter (HOSPITAL_COMMUNITY): Payer: 59 | Admitting: Physical Therapy

## 2023-08-02 ENCOUNTER — Ambulatory Visit (HOSPITAL_COMMUNITY): Payer: 59

## 2023-08-02 ENCOUNTER — Encounter (HOSPITAL_COMMUNITY): Payer: 59

## 2023-08-02 DIAGNOSIS — M6281 Muscle weakness (generalized): Secondary | ICD-10-CM

## 2023-08-02 DIAGNOSIS — M545 Low back pain, unspecified: Secondary | ICD-10-CM | POA: Diagnosis not present

## 2023-08-02 DIAGNOSIS — G35 Multiple sclerosis: Secondary | ICD-10-CM

## 2023-08-02 DIAGNOSIS — R2689 Other abnormalities of gait and mobility: Secondary | ICD-10-CM | POA: Diagnosis not present

## 2023-08-02 DIAGNOSIS — R262 Difficulty in walking, not elsewhere classified: Secondary | ICD-10-CM

## 2023-08-02 DIAGNOSIS — M549 Dorsalgia, unspecified: Secondary | ICD-10-CM | POA: Diagnosis not present

## 2023-08-02 NOTE — Therapy (Signed)
OUTPATIENT PHYSICAL THERAPY THORACOLUMBAR TREATMENT   Patient Name: Melinda Moore MRN: [id number] DOB:[birth year], 53 y.o., female Today's Date: 08/02/2023  END OF SESSION:  PT End of Session - 08/02/23 1305     Visit Number 2    Number of Visits 8    Date for PT Re-Evaluation 08/14/23    Authorization Type [employer]    PT Start Time 1305    PT Stop Time 1345    PT Time Calculation (min) 40 min    Activity Tolerance Patient tolerated treatment well    Behavior During Therapy WFL for tasks assessed/performed             Past Medical History:  Diagnosis Date   Anemia    Anxiety    Bell's palsy    Depression    DVT (deep venous thrombosis) (HCC) 09/02/2012   [place] on 02/13/2012 showed acute DVT in left calf veins and posterior tibial veins   Fibromyalgia    GERD (gastroesophageal reflux disease)    History of blood transfusion 2000   History of trigeminal neuralgia    HTN (hypertension)    patient denies was taking a blood pressure medication in early 2000 but it was migraines   Leukopenia    MS (multiple sclerosis) (HCC) 1997   Neuropathic pain    Peripheral edema    Port catheter in place 12/31/2012   Right bundle branch block    Past Surgical History:  Procedure Laterality Date   ABDOMINAL HYSTERECTOMY     BIOPSY  05/08/2023   Procedure: BIOPSY;  Surgeon: Corbin Ade, MD;  [facility]: [facility];  Service: Endoscopy;;   CHOLECYSTECTOMY     COLONOSCOPY WITH PROPOFOL N/A 09/27/2021   Procedure: COLONOSCOPY WITH PROPOFOL;  Surgeon: Corbin Ade, MD;  [facility]: [facility];  Service: Endoscopy;  Laterality: N/A;  8:00am, asa 2   ESOPHAGOGASTRODUODENOSCOPY  2011   Dr. Jena Gauss. 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 Biopsy from the stomach revealed minimal chronic inactive inflammation but negative for H. pylori   ESOPHAGOGASTRODUODENOSCOPY (EGD) WITH PROPOFOL N/A 05/08/2023    Procedure: ESOPHAGOGASTRODUODENOSCOPY (EGD) WITH PROPOFOL;  Surgeon: Corbin Ade, MD;  [facility];  Service: Endoscopy;  Laterality: N/A;  2:30 pm, asa 3   FLEXIBLE SIGMOIDOSCOPY N/A 02/08/2022   Procedure: FLEXIBLE SIGMOIDOSCOPY;  Surgeon: Corbin Ade, MD;  [facility]: [facility];  Service: Endoscopy;  Laterality: N/A;   LAPAROSCOPIC GASTRIC SLEEVE RESECTION N/A 04/17/2016   Procedure: LAPAROSCOPIC GASTRIC SLEEVE RESECTION WITH UPPER ENDOSCOPY;  Surgeon: Glenna Fellows, MD;  Location: [facility];  Service: General;  Laterality: N/A;   PATELLA-FEMORAL ARTHROPLASTY Right 07/14/2019   Procedure: PATELLA-FEMORAL ARTHROPLASTY;  Surgeon: Teryl Lucy, MD;  Location: [facility];  Service: Orthopedics;  Laterality: Right;   PORTACATH PLACEMENT     TUBAL LIGATION  2001   Patient Active Problem List   Diagnosis Date Noted   UTI (urinary tract infection), bacterial 03/21/2023   Urinary frequency 02/18/2023   Chronic low back pain (1ry area of Pain) (Bilateral) (R>L) w/o sciatica 07/18/2022   Lumbar facet arthropathy (Bilateral) 07/18/2022   Lumbar facet joint pain 07/18/2022   Chronic lower extremity pain (2ry area of Pain) (Right) 07/18/2022   DDD (degenerative disc disease), lumbosacral 07/18/2022   Abnormal MRI, lumbar spine (10/11/2017) 07/18/2022   Lumbosacral foraminal stenosis (Left: L5-S1) 07/18/2022   Obesity, Class II, BMI 35-39.9 07/18/2022   Osteoarthritis of facet  joint of lumbar spine 07/18/2022   Primary osteoarthritis of lumbar spine 07/18/2022   History of total knee replacement (Right) 07/18/2022   Chronic knee pain s/p TKR (Right) 07/18/2022   Chronic medial knee pain (Right) 07/18/2022   Gastroesophageal reflux disease 09/07/2021   Hx of hysterectomy 06/14/2021   Patellofemoral arthritis of knee (Right) 07/14/2019   History of DVT in adulthood 06/23/2019   Radiculopathy due to lumbar intervertebral disc disorder 09/21/2016   Spondylosis without myelopathy or  radiculopathy, lumbar region 09/21/2016   Primary osteoarthritis of knee (Right) 08/30/2016   Primary insomnia 08/30/2016   Myofascial pain 08/30/2016   Chronic anxiety 07/08/2013   Chronic pain syndrome 03/11/2013   Port-A-Cath in place 12/31/2012   Constipation 10/01/2012   MS (multiple sclerosis) (HCC) 09/02/2012   Other dysphagia 04/12/2010    PCP: Babs Sciara, MD  REFERRING PROVIDER: Babs Sciara, MD  REFERRING DIAG: M54.9 (ICD-10-CM) - Back pain, unspecified back location, unspecified back pain laterality, unspecified chronicity  Rationale for Evaluation and Treatment: Rehabilitation  THERAPY DIAG:  Low back pain, unspecified back pain laterality, unspecified chronicity, unspecified whether sciatica present  Difficulty in walking, not elsewhere classified  Other abnormalities of gait and mobility  Muscle weakness (generalized)  Multiple sclerosis (HCC)  ONSET DATE: chronic  SUBJECTIVE:                                                                                                                                                                                           SUBJECTIVE STATEMENT: Patient reports today is "a good day"; but her back still feels stiff.   EVAL:Patient well known to this clinic.  "Same back stiffness; right leg pain"; has not yet been able to start going to the gym or anything yet.  She had a kidney infection at the end of year and had to lay around a lot so she got stiff all over again.    PERTINENT HISTORY:  MS OA right knee  PAIN:  Are you having pain? Yes: NPRS scale: 8/10 Pain location: back and all the way down the front and lateral part of right leg Pain description: back aching and stiff; leg is numb and tingling Aggravating factors: walking and standing Relieving factors: pain medication , stretching, elevating legs  PRECAUTIONS: None  RED FLAGS: None   WEIGHT BEARING RESTRICTIONS: No  FALLS:  Has patient fallen  in last 6 months? No  OCCUPATION: not working  PLOF: Independent  PATIENT GOALS: stand up straight and walk without being bent over; get off pain medication  NEXT MD VISIT: 08/06/23  OBJECTIVE:  Note: Objective measures were completed  at Evaluation unless otherwise noted.  DIAGNOSTIC FINDINGS:  None recently  PATIENT SURVEYS:  Modified Oswestry 18/50  36%   COGNITION: Overall cognitive status: Within functional limits for tasks assessed     SENSATION: Burning and tingling right leg down to lateral ankle   POSTURE: rounded shoulders, forward head, and flexed trunk   PALPATION: General soreness lumbar spine  LUMBAR ROM:   AROM eval  Flexion Fingertips to ankle  Extension Unable to achieve full upright standing posture  Right lateral flexion To mid calf but is flexing at trunk  Left lateral flexion To mid calf but is flexin at trunk  Right rotation   Left rotation    (Blank rows = not tested)  LOWER EXTREMITY ROM:     Active  Right eval Left eval  Hip flexion    Hip extension    Hip abduction    Hip adduction    Hip internal rotation    Hip external rotation    Knee flexion    Knee extension    Ankle dorsiflexion    Ankle plantarflexion    Ankle inversion    Ankle eversion     (Blank rows = not tested)  LOWER EXTREMITY MMT:    MMT Right eval Left eval  Hip flexion 4 4+  Hip extension 3- 3 tends to rotate  Hip abduction    Hip adduction    Hip internal rotation    Hip external rotation    Knee flexion 4- 4  Knee extension 4+ 5  Ankle dorsiflexion 5 5  Ankle plantarflexion    Ankle inversion    Ankle eversion     (Blank rows = not tested)  FUNCTIONAL TESTS:  5 times sit to stand: 19.61 sec 2 minute walk test: 217 ft (stopped at 1 min and 20 sec due to pain and fatigue)  GAIT: Distance walked: 217 ft Assistive device utilized: None Level of assistance: Modified independence and SBA Comments: forward flexed trunk; decreased heel strike,  increased lateral trunk sway  TREATMENT DATE:  08/02/23 Nustep seat 10 x 5' dynamic warm up Review of HEP and goals Standing: lumbar extension over bar (// bars) Heel raises 2 x 10 Slant board 5 x 20" Hip abduction 2 x 10 Hip extension 2 x 10 At wall; forearms on wall with hips extending to wall x 10 GTB scapular rows 2 x 10 GTB shoulder extension 2 x 10 Supine: Modified thomas stretch with towel 5 x 20" each Bridge 2 x 10   07/17/23 physical therapy evaluation and HEP instruction     Nustep seat 10 x 5'                                                                                                                             PATIENT EDUCATION:  Education details: Patient educated on exam findings, POC, scope of PT, HEP, and discussion of aquatic exercises; [employer]. Person educated: Patient Education method: Explanation,  Demonstration, and Handouts Education comprehension: verbalized understanding, returned demonstration, verbal cues required, and tactile cues required  HOME EXERCISE PROGRAM: Access Code: [email or web address] URL: [email or web address] Date: 07/17/2023 Prepared by: [facility]  Exercises - Static Prone on Elbows  - 1 x daily - 7 x weekly - 1 sets - 1 reps - 1 min hold - Prone Press Up  - 1 x daily - 7 x weekly - 1 sets - 10 reps - Standing Lumbar Extension with Counter  - 1 x daily - 7 x weekly - 1 sets - 10 reps - Standing Lumbar Extension at Wall - Forearms  - 1 x daily - 7 x weekly - 1 sets - 10 reps  ASSESSMENT:  CLINICAL IMPRESSION: Today's session started with a review of HEP and goals.  Patient verbalizes agreement with set rehab goals.   Noted limited range with hip abduction and extension. Added modified Thomas stretch with towel and bridge to HEP.  Patient will benefit from continued skilled therapy services to address deficits and promote return to optimal function.       Eval:Patient is a 53 y.o. female who was seen today for physical therapy  evaluation and treatment for M54.9 (ICD-10-CM) - Back pain, unspecified back location, unspecified back pain laterality, unspecified chronicity. Patient demonstrates muscle weakness, reduced ROM, and fascial restrictions which are likely contributing to symptoms of pain and are negatively impacting patient ability to perform ADLs and functional mobility tasks. Patient will benefit from skilled physical therapy services to address these deficits to reduce pain and improve level of function with ADLs and functional mobility tasks.   OBJECTIVE IMPAIRMENTS: Abnormal gait, decreased activity tolerance, decreased endurance, decreased mobility, difficulty walking, decreased ROM, decreased strength, hypomobility, increased fascial restrictions, impaired perceived functional ability, impaired flexibility, postural dysfunction, and pain.   ACTIVITY LIMITATIONS: carrying, lifting, bending, sitting, standing, squatting, stairs, transfers, bed mobility, and locomotion level  PARTICIPATION LIMITATIONS: meal prep, cleaning, laundry, shopping, and community activity  PERSONAL FACTORS: Past/current experiences and 1 comorbidity: MS  are also affecting patient's functional outcome.   REHAB POTENTIAL: Good  CLINICAL DECISION MAKING: Evolving/moderate complexity  EVALUATION COMPLEXITY: Moderate   GOALS: Goals reviewed with patient? No  SHORT TERM GOALS: Target date: 07/31/2023  patient will be independent with initial HEP  Baseline: Goal status: in progress  2.  Patient will report 30% improvement overall  Baseline:  Goal status: in progress  LONG TERM GOALS: Target date: 08/14/22  Patient will be independent in self management strategies to improve quality of life and functional outcomes.  Baseline:  Goal status: in progress  2.  Patient will report 50% improvement overall  Baseline:  Goal status: in progress  3.  Patient will improve Modified Oswestry score by 8 points to demonstrate  improved perceived function   Baseline: 18/50 Goal status: In progress  4.  Patient will improve 5 times sit to stand score to 15 sec or less to demonstrate improved functional mobility and increased leg strength.     Baseline: 19.61 sec Goal status: in progress  5.  Patient will increase distance on to 250 ft demonstrate improved functional mobility walking household and community distances.   Baseline: 217 ft Goal status: in progress  PLAN:  PT FREQUENCY: 2x/week  PT DURATION: 4 weeks  PLANNED INTERVENTIONS: 97164- PT Re-evaluation, 97110-Therapeutic exercises, 97530- Therapeutic activity, [id number]- Neuromuscular re-education, 97535- Self Care, 16109- Manual therapy, [id number]- Gait training, [phone]- Orthotic Fit/training, [phone]- Canalith repositioning, [id number]- Aquatic Therapy, [phone]- Splinting, Patient/Family education,  Balance training, Stair training, Taping, Dry Needling, Joint mobilization, Joint manipulation, Spinal manipulation, Spinal mobilization, Scar mobilization, and DME instructions. Marland Kitchen  PLAN FOR NEXT SESSION: postural and lower extremity strengthening; spinal extension  1:45 PM, 08/02/23 Reginna Sermeno Small Raelin Pixler MPT [facility] [place] [phone] Ph:[phone]

## 2023-08-06 ENCOUNTER — Ambulatory Visit (INDEPENDENT_AMBULATORY_CARE_PROVIDER_SITE_OTHER): Payer: 59 | Admitting: Family Medicine

## 2023-08-06 ENCOUNTER — Encounter: Payer: Self-pay | Admitting: Family Medicine

## 2023-08-06 ENCOUNTER — Ambulatory Visit (HOSPITAL_COMMUNITY): Payer: 59

## 2023-08-06 VITALS — BP 122/72 | HR 96 | Temp 98.6°F | Ht 69.0 in | Wt 250.0 lb

## 2023-08-06 DIAGNOSIS — R0683 Snoring: Secondary | ICD-10-CM | POA: Diagnosis not present

## 2023-08-06 DIAGNOSIS — M545 Low back pain, unspecified: Secondary | ICD-10-CM | POA: Diagnosis not present

## 2023-08-06 DIAGNOSIS — R262 Difficulty in walking, not elsewhere classified: Secondary | ICD-10-CM | POA: Diagnosis not present

## 2023-08-06 DIAGNOSIS — R2689 Other abnormalities of gait and mobility: Secondary | ICD-10-CM

## 2023-08-06 DIAGNOSIS — G35 Multiple sclerosis: Secondary | ICD-10-CM

## 2023-08-06 DIAGNOSIS — E875 Hyperkalemia: Secondary | ICD-10-CM

## 2023-08-06 DIAGNOSIS — D509 Iron deficiency anemia, unspecified: Secondary | ICD-10-CM | POA: Diagnosis not present

## 2023-08-06 DIAGNOSIS — M6281 Muscle weakness (generalized): Secondary | ICD-10-CM | POA: Diagnosis not present

## 2023-08-06 DIAGNOSIS — Z79891 Long term (current) use of opiate analgesic: Secondary | ICD-10-CM | POA: Diagnosis not present

## 2023-08-06 DIAGNOSIS — M549 Dorsalgia, unspecified: Secondary | ICD-10-CM | POA: Diagnosis not present

## 2023-08-06 MED ORDER — METHYLPHENIDATE HCL ER (LA) 10 MG PO CP24
10.0000 mg | ORAL_CAPSULE | Freq: Every day | ORAL | 0 refills | Status: AC
Start: 2023-08-06 — End: ?

## 2023-08-06 NOTE — Progress Notes (Unsigned)
 Subjective:    Patient ID: Melinda Moore, female    DOB: 1970/11/23, 53 y.o.   MRN: [id number]  Discussed the use of AI scribe software for clinical note transcription with the patient, who gave verbal consent to proceed. This patient was seen today for chronic pain  The medication list was reviewed and updated.   Location of Pain for which the patient has been treated with regarding narcotics: Patient has neuropathic pain related to MS as well as severe ankle and back pain.  Onset of this pain: This been going on for years   -Compliance with medication: She was under the care of a neurologist for this but that neurologist is not around anymore and patient has had her pain medicine through [place]  - Number patient states they take daily: Patient has tried hydrocodone and oxycodone before and did not get relief with that She is on hydromorphone 4 mg maximum 4 tablets/day  -Reason for ongoing use of opioids MS related pain  What other measures have been tried outside of opioids NSAIDs, Tylenol  In the ongoing specialists regarding this condition neurology  -when was the last dose patient took?  Within the past day  The patient was advised the importance of maintaining medication and not using illegal substances with these.  Here for refills and follow up  The patient was educated that we can provide 3 monthly scripts for their medication, it is their responsibility to follow the instructions.  Side effects or complications from medications: Denies side effects  Patient is aware that pain medications are meant to minimize the severity of the pain to allow their pain levels to improve to allow for better function. They are aware of that pain medications cannot totally remove their pain.  Due for UDT ( at least once per year) (pain management contract is also completed at the time of the UDT): Summer of 2024  Scale of 1 to 10 ( 1 is least 10 is most) Your pain level without the  medicine: 10 Your pain level with medication 7.5  Scale 1 to 10 ( 1-helps very little, 10 helps very well) How well does your pain medication reduce your pain so you can function better through out the day? 7  Quality of the pain: Burning aching throbbing  Persistence of the pain: Present all the time  Modifying factors: Worse on some days versus others seemingly somewhat worse with activity  Patient denies drowsiness with the medicine      History of Present Illness   Melinda Moore is a 53 year old female with multiple sclerosis who presents with fatigue and medication management.  She is experiencing increased fatigue, which may be related to her return to physical therapy. She is considering restarting Ritalin 5 mg, which she previously found beneficial. She has been off Ritalin for some time and has used both immediate and long-acting formulations, preferring the lower dose due to past experiences of jitteriness with higher doses.  She experiences variable pain levels, particularly worsening with physical activity such as housework or grocery shopping. She takes hydromorphone, four tablets a day, and reports no drowsiness or feeling 'doped up'.  She reports significant neuropathy, particularly in her hands, and uses a lotion for diabetic neuropathy pain relief. She manages her symptoms with gabapentin, taking four tablets at bedtime and one 100 mg tablet during the day to avoid daytime grogginess. She describes her neuropathy as 'pretty bad these days'.  She has multiple sclerosis, which  she states is stable with no new active lesions on her last MRI. She is on Kesimpta injections and tolerates them well, with no progression in her MS symptoms.  She has a history of anemia and her recent blood work showed slightly low hemoglobin and slightly high potassium. She has been anemic in the past.  No swelling in her ankles and maintains good strength in her legs and hands. She experiences  snoring at night, which has been noted by others, but she does not consider it severe.      Patient relates low energy during the day she feels that she is having to some degree problems with her MS that her neurologist in the past had her on Ritalin Her current neurologist recommended her trying Ritalin again but stated that this will be handled through primary care  Review of Systems     Objective:    Physical Exam   EXTREMITIES: No ankle swelling    General-in no acute distress Eyes-no discharge Lungs-respiratory rate normal, CTA CV-no murmurs,RRR Extremities skin warm dry no edema Neuro grossly normal Behavior normal, alert   {Labs (Optional):23779}     Assessment & Plan:  Assessment and Plan    Multiple Sclerosis (MS) Stable with no new active lesions on recent MRI. Fatigue reported, possibly related to increased physical therapy. Currently on Kesimpta injections with good tolerance. -Consider long-acting Ritalin 10mg  daily for fatigue, starting at a lower dose and adjusting as needed. -Continue current MS management with Kesimpta injections. -Consider sleep study due to potential for sleep apnea contributing to daytime fatigue.  Chronic Pain Managed with hydromorphone 4mg  four times daily. Some days are worse than others, particularly with increased activity. -Gradual tapering of hydromorphone to be initiated at next visit due to [facility] guidelines. -Consider referral to pain management if unable to tolerate lower dose.  Constipation Managed with Symproic once daily, a change from previous Lincocin due to lack of efficacy. -Continue Symproic as currently prescribed.  Anemia Hemoglobin slightly low, possibly due to chronic health issues. -Check iron storage levels at next blood work to assess for iron deficiency.  Hyperkalemia Potassium slightly elevated at last blood work. -Repeat blood work to reassess potassium levels.  General Health Maintenance -Up to  date on colonoscopy and mammogram. -Repeat blood work to reassess potassium and iron levels. -Follow-up in three months to discuss pain management and potential initiation of Ritalin.     1. MS (multiple sclerosis) (HCC) (Primary) Having MS related fatigue We will try Ritalin but have to start at low-dose of 10 mg LA patient will give [place] feedback within the next 3 to 4 weeks regarding the effectiveness of this medicine Expectations were tempered  2. Microcytic anemia Check lab work because hemoglobin slightly low MCV is low patient has had a lifelong history of anemia - Iron, TIBC and Ferritin Panel  3. Hyperkalemia Recent lab work elevated potassium we did discuss avoiding foods with excess potassium - Basic metabolic panel  4. Snoring Patient with significant snoring at nighttime my concern is sleep apnea would recommend consultation with sleep study  5. Encounter for long-term opiate analgesic use The patient was seen in followup for chronic pain. A review over at their current pain status was discussed. Drug registry was checked. Prescriptions were given.  Regular follow-up recommended. Discussion was held regarding the importance of compliance with medication as well as pain medication contract.  Patient was informed that medication may cause drowsiness and should not be combined  with  other medications/alcohol or street drugs. If the patient feels medication is causing altered alertness then do not drive or operate dangerous equipment.  Should be noted that the patient appears to be meeting appropriate use of opioids and response.  Evidenced by improved function and decent pain control without significant side effects and no evidence of overt aberrancy issues.  Upon discussion with the patient today they understand that opioid therapy is optional and they feel that the pain has been refractory to reasonable conservative measures and is significant and affecting quality of life  enough to warrant ongoing therapy and wishes to continue opioids.  Refills were provided.  [facility] medical Board guidelines regarding the pain medicine has been reviewed.  CDC guidelines most updated 2022 has been reviewed by the prescriber.  PDMP is checked on a regular basis yearly urine drug screen and pain management contract  Treatment plan for this patient includes #1-gentle stretching exercises as shown daily basis 2.  Mild strength exercises 3 times per week #3 continue pain medications #4 notify us if any digression  I did discuss the most recent changes with pain management how my practice will be moving toward what are the guidelines for primary care Given that her level of medication she has 2 choices.  1 choices to gradually taper down the other choices pain management she will think about this 5 encouraged her to try to cut down to 3-1/2 tablets in the meantime intermittently over the next few months and a follow-up within 3 months and at that time we would need to implement 1 or the other of those strategies, letter was given to the patient regarding this

## 2023-08-06 NOTE — Therapy (Signed)
 OUTPATIENT PHYSICAL THERAPY THORACOLUMBAR TREATMENT   Patient Name: Melinda Moore MRN: [id number] DOB:Jul 25, 1970, 53 y.o., female Today's Date: 08/06/2023  END OF SESSION:  PT End of Session - 08/06/23 0932     Visit Number 3    Number of Visits 8    Date for PT Re-Evaluation 08/14/23    Authorization Type [employer]    PT Start Time 0932    PT Stop Time 1012    PT Time Calculation (min) 40 min    Activity Tolerance Patient tolerated treatment well    Behavior During Therapy [facility] for tasks assessed/performed             Past Medical History:  Diagnosis Date   Anemia    Anxiety    Bell's palsy    Depression    DVT (deep venous thrombosis) (HCC) 09/02/2012   [place] on 02/13/2012 showed acute DVT in left calf veins and posterior tibial veins   Fibromyalgia    GERD (gastroesophageal reflux disease)    History of blood transfusion 2000   History of trigeminal neuralgia    HTN (hypertension)    patient denies was taking a blood pressure medication in early 2000 but it was migraines   Leukopenia    MS (multiple sclerosis) (HCC) 1997   Neuropathic pain    Peripheral edema    Port catheter in place 12/31/2012   Right bundle branch block    Past Surgical History:  Procedure Laterality Date   ABDOMINAL HYSTERECTOMY     BIOPSY  05/08/2023   Procedure: BIOPSY;  Surgeon: Corbin Ade, MD;  Location: [facility];  Service: Endoscopy;;   CHOLECYSTECTOMY     COLONOSCOPY WITH PROPOFOL N/A 09/27/2021   Procedure: COLONOSCOPY WITH PROPOFOL;  Surgeon: Corbin Ade, MD;  Location: [facility];  Service: Endoscopy;  Laterality: N/A;  8:00am, asa 2   ESOPHAGOGASTRODUODENOSCOPY  2011   Dr. Jena Gauss. 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 Biopsy from the stomach revealed minimal chronic inactive inflammation but negative for H. pylori   ESOPHAGOGASTRODUODENOSCOPY (EGD) WITH PROPOFOL N/A 05/08/2023    Procedure: ESOPHAGOGASTRODUODENOSCOPY (EGD) WITH PROPOFOL;  Surgeon: Corbin Ade, MD;  [facility]: [facility];  Service: Endoscopy;  Laterality: N/A;  2:30 pm, asa 3   FLEXIBLE SIGMOIDOSCOPY N/A 02/08/2022   Procedure: FLEXIBLE SIGMOIDOSCOPY;  Surgeon: Corbin Ade, MD;  Location: [facility];  Service: Endoscopy;  Laterality: N/A;   LAPAROSCOPIC GASTRIC SLEEVE RESECTION N/A 04/17/2016   Procedure: LAPAROSCOPIC GASTRIC SLEEVE RESECTION WITH UPPER ENDOSCOPY;  Surgeon: Glenna Fellows, MD;  Location: [facility];  Service: General;  Laterality: N/A;   PATELLA-FEMORAL ARTHROPLASTY Right 07/14/2019   Procedure: PATELLA-FEMORAL ARTHROPLASTY;  Surgeon: Teryl Lucy, MD;  Location: [facility];  Service: Orthopedics;  Laterality: Right;   PORTACATH PLACEMENT     TUBAL LIGATION  2001   Patient Active Problem List   Diagnosis Date Noted   UTI (urinary tract infection), bacterial 03/21/2023   Urinary frequency 02/18/2023   Chronic low back pain (1ry area of Pain) (Bilateral) (R>L) w/o sciatica 07/18/2022   Lumbar facet arthropathy (Bilateral) 07/18/2022   Lumbar facet joint pain 07/18/2022   Chronic lower extremity pain (2ry area of Pain) (Right) 07/18/2022   DDD (degenerative disc disease), lumbosacral 07/18/2022   Abnormal MRI, lumbar spine (10/11/2017) 07/18/2022   Lumbosacral foraminal stenosis (Left: L5-S1) 07/18/2022   Obesity, Class II, BMI 35-39.9 07/18/2022   Osteoarthritis of facet  joint of lumbar spine 07/18/2022   Primary osteoarthritis of lumbar spine 07/18/2022   History of total knee replacement (Right) 07/18/2022   Chronic knee pain s/p TKR (Right) 07/18/2022   Chronic medial knee pain (Right) 07/18/2022   Gastroesophageal reflux disease 09/07/2021   Hx of hysterectomy 06/14/2021   Patellofemoral arthritis of knee (Right) 07/14/2019   History of DVT in adulthood 06/23/2019   Radiculopathy due to lumbar intervertebral disc disorder 09/21/2016   Spondylosis without myelopathy or  radiculopathy, lumbar region 09/21/2016   Primary osteoarthritis of knee (Right) 08/30/2016   Primary insomnia 08/30/2016   Myofascial pain 08/30/2016   Chronic anxiety 07/08/2013   Chronic pain syndrome 03/11/2013   Port-A-Cath in place 12/31/2012   Constipation 10/01/2012   MS (multiple sclerosis) (HCC) 09/02/2012   Other dysphagia 04/12/2010    PCP: Babs Sciara, MD  REFERRING PROVIDER: Babs Sciara, MD  REFERRING DIAG: M54.9 (ICD-10-CM) - Back pain, unspecified back location, unspecified back pain laterality, unspecified chronicity  Rationale for Evaluation and Treatment: Rehabilitation  THERAPY DIAG:  Low back pain, unspecified back pain laterality, unspecified chronicity, unspecified whether sciatica present  Difficulty in walking, not elsewhere classified  Other abnormalities of gait and mobility  Muscle weakness (generalized)  Multiple sclerosis (HCC)  ONSET DATE: chronic  SUBJECTIVE:                                                                                                                                                                                           SUBJECTIVE STATEMENT: Feeling pretty good today; no new issues  EVAL:Patient well known to this clinic.  "Same back stiffness; right leg pain"; has not yet been able to start going to the gym or anything yet.  She had a kidney infection at the end of year and had to lay around a lot so she got stiff all over again.    PERTINENT HISTORY:  MS OA right knee  PAIN:  Are you having pain? Yes: NPRS scale: 8/10 Pain location: back and all the way down the front and lateral part of right leg Pain description: back aching and stiff; leg is numb and tingling Aggravating factors: walking and standing Relieving factors: pain medication , stretching, elevating legs  PRECAUTIONS: None  RED FLAGS: None   WEIGHT BEARING RESTRICTIONS: No  FALLS:  Has patient fallen in last 6 months?  No  OCCUPATION: not working  PLOF: Independent  PATIENT GOALS: stand up straight and walk without being bent over; get off pain medication  NEXT MD VISIT: 08/06/23  OBJECTIVE:  Note: Objective measures were completed at Evaluation unless otherwise noted.  DIAGNOSTIC  FINDINGS:  None recently  PATIENT SURVEYS:  Modified Oswestry 18/50  36%   COGNITION: Overall cognitive status: Within functional limits for tasks assessed     SENSATION: Burning and tingling right leg down to lateral ankle   POSTURE: rounded shoulders, forward head, and flexed trunk   PALPATION: General soreness lumbar spine  LUMBAR ROM:   AROM eval  Flexion Fingertips to ankle  Extension Unable to achieve full upright standing posture  Right lateral flexion To mid calf but is flexing at trunk  Left lateral flexion To mid calf but is flexin at trunk  Right rotation   Left rotation    (Blank rows = not tested)  LOWER EXTREMITY ROM:     Active  Right eval Left eval  Hip flexion    Hip extension    Hip abduction    Hip adduction    Hip internal rotation    Hip external rotation    Knee flexion    Knee extension    Ankle dorsiflexion    Ankle plantarflexion    Ankle inversion    Ankle eversion     (Blank rows = not tested)  LOWER EXTREMITY MMT:    MMT Right eval Left eval  Hip flexion 4 4+  Hip extension 3- 3 tends to rotate  Hip abduction    Hip adduction    Hip internal rotation    Hip external rotation    Knee flexion 4- 4  Knee extension 4+ 5  Ankle dorsiflexion 5 5  Ankle plantarflexion    Ankle inversion    Ankle eversion     (Blank rows = not tested)  FUNCTIONAL TESTS:  5 times sit to stand: 19.61 sec 2 minute walk test: 217 ft (stopped at 1 min and 20 sec due to pain and fatigue)  GAIT: Distance walked: 217 ft Assistive device utilized: None Level of assistance: Modified independence and SBA Comments: forward flexed trunk; decreased heel strike, increased lateral  trunk sway  TREATMENT DATE:  08/06/23 Standing: Lumbar extension over // bar x 10 Heel raises 2 x 10 Slant board 5 x 20" Hip abduction 2  x 10 Hip extension 2 x 10 Modified warrior 2 in // bars; flexing and extending front foot.   Seated: Hamstring stretch with strap 10 x 10" Nustep seat 10 level 2 x 5' conditioning  Sit to stand with round tidal tank 2 x 10 Deadlift with tidal tank with tube x 10  08/02/23 Nustep seat 10 x 5' dynamic warm up Review of HEP and goals Standing: lumbar extension over bar (// bars) Heel raises 2 x 10 Slant board 5 x 20" Hip abduction 2 x 10 Hip extension 2 x 10 At wall; forearms on wall with hips extending to wall x 10 GTB scapular rows 2 x 10 GTB shoulder extension 2 x 10 Supine: Modified thomas stretch with towel 5 x 20" each Bridge 2 x 10   07/17/23 physical therapy evaluation and HEP instruction     Nustep seat 10 x 5'  PATIENT EDUCATION:  Education details: Patient educated on exam findings, POC, scope of PT, HEP, and discussion of aquatic exercises; [employer]. Person educated: Patient Education method: Explanation, Demonstration, and Handouts Education comprehension: verbalized understanding, returned demonstration, verbal cues required, and tactile cues required  HOME EXERCISE PROGRAM: Access Code: [email or web address] URL: [email or web address] Date: 07/17/2023 Prepared by: [facility]  Exercises - Static Prone on Elbows  - 1 x daily - 7 x weekly - 1 sets - 1 reps - 1 min hold - Prone Press Up  - 1 x daily - 7 x weekly - 1 sets - 10 reps - Standing Lumbar Extension with Counter  - 1 x daily - 7 x weekly - 1 sets - 10 reps - Standing Lumbar Extension at Wall - Forearms  - 1 x daily - 7 x weekly - 1 sets - 10 reps  ASSESSMENT:  CLINICAL IMPRESSION: Focus today on lumbar mobility, core strengthening and  balance; added tidal tank for increased pertubation, neuroreducation with activity;  Patient with max difficulty with modified warrior 2 pose as she struggles with maintaining good trunk extension.  Needs continued cues for posturing with hip exercises.  Has to take 3 seated breaks during treatment today; moderate fatigue after treatment reported.  Patient will benefit from continued skilled therapy services to address deficits and promote return to optimal function.       Eval:Patient is a 53 y.o. female who was seen today for physical therapy evaluation and treatment for M54.9 (ICD-10-CM) - Back pain, unspecified back location, unspecified back pain laterality, unspecified chronicity. Patient demonstrates muscle weakness, reduced ROM, and fascial restrictions which are likely contributing to symptoms of pain and are negatively impacting patient ability to perform ADLs and functional mobility tasks. Patient will benefit from skilled physical therapy services to address these deficits to reduce pain and improve level of function with ADLs and functional mobility tasks.   OBJECTIVE IMPAIRMENTS: Abnormal gait, decreased activity tolerance, decreased endurance, decreased mobility, difficulty walking, decreased ROM, decreased strength, hypomobility, increased fascial restrictions, impaired perceived functional ability, impaired flexibility, postural dysfunction, and pain.   ACTIVITY LIMITATIONS: carrying, lifting, bending, sitting, standing, squatting, stairs, transfers, bed mobility, and locomotion level  PARTICIPATION LIMITATIONS: meal prep, cleaning, laundry, shopping, and community activity  PERSONAL FACTORS: Past/current experiences and 1 comorbidity: MS  are also affecting patient's functional outcome.   REHAB POTENTIAL: Good  CLINICAL DECISION MAKING: Evolving/moderate complexity  EVALUATION COMPLEXITY: Moderate   GOALS: Goals reviewed with patient? No  SHORT TERM GOALS: Target date:  07/31/2023  patient will be independent with initial HEP  Baseline: Goal status: in progress  2.  Patient will report 30% improvement overall  Baseline:  Goal status: in progress  LONG TERM GOALS: Target date: 08/14/22  Patient will be independent in self management strategies to improve quality of life and functional outcomes.  Baseline:  Goal status: in progress  2.  Patient will report 50% improvement overall  Baseline:  Goal status: in progress  3.  Patient will improve Modified Oswestry score by 8 points to demonstrate improved perceived function   Baseline: 18/50 Goal status: In progress  4.  Patient will improve 5 times sit to stand score to 15 sec or less to demonstrate improved functional mobility and increased leg strength.     Baseline: 19.61 sec Goal status: in progress  5.  Patient will increase distance on to 250 ft demonstrate improved functional mobility walking household and community distances.   Baseline: 217 ft Goal status:  in progress  PLAN:  PT FREQUENCY: 2x/week  PT DURATION: 4 weeks  PLANNED INTERVENTIONS: 97164- PT Re-evaluation, 97110-Therapeutic exercises, 97530- Therapeutic activity, 97112- Neuromuscular re-education, [phone]- Self Care, 60454- Manual therapy, [phone]- Gait training, [phone]- Orthotic Fit/training, [phone]- Canalith repositioning, [id number]- Aquatic Therapy, [phone]- Splinting, Patient/Family education, Balance training, Stair training, Taping, Dry Needling, Joint mobilization, Joint manipulation, Spinal manipulation, Spinal mobilization, Scar mobilization, and DME instructions. Marland Kitchen  PLAN FOR NEXT SESSION: postural and lower extremity strengthening; spinal extension  10:17 AM, 08/06/23 Amarilis Belflower Small Tonilynn Bieker MPT [facility] [place] [phone] [phone]

## 2023-08-07 ENCOUNTER — Encounter (HOSPITAL_COMMUNITY): Payer: 59 | Admitting: Physical Therapy

## 2023-08-08 ENCOUNTER — Other Ambulatory Visit: Payer: Self-pay | Admitting: Family Medicine

## 2023-08-08 ENCOUNTER — Other Ambulatory Visit: Payer: Self-pay

## 2023-08-08 ENCOUNTER — Encounter: Payer: Self-pay | Admitting: Family Medicine

## 2023-08-08 DIAGNOSIS — G4730 Sleep apnea, unspecified: Secondary | ICD-10-CM

## 2023-08-08 MED ORDER — ONDANSETRON 8 MG PO TBDP: 8.0000 mg | ORAL_TABLET | Freq: Three times a day (TID) | ORAL | 4 refills | Status: AC | PRN

## 2023-08-08 MED ORDER — HYDROMORPHONE HCL 4 MG PO TABS: ORAL_TABLET | ORAL | 0 refills | Status: DC

## 2023-08-09 ENCOUNTER — Encounter (HOSPITAL_COMMUNITY): Payer: 59 | Admitting: Physical Therapy

## 2023-08-14 ENCOUNTER — Encounter (HOSPITAL_COMMUNITY): Payer: 59 | Admitting: Physical Therapy

## 2023-08-16 ENCOUNTER — Encounter (HOSPITAL_COMMUNITY): Payer: 59

## 2023-08-21 ENCOUNTER — Ambulatory Visit (HOSPITAL_COMMUNITY): Payer: 59 | Attending: Family Medicine

## 2023-08-21 DIAGNOSIS — M545 Low back pain, unspecified: Secondary | ICD-10-CM | POA: Diagnosis not present

## 2023-08-21 DIAGNOSIS — R2689 Other abnormalities of gait and mobility: Secondary | ICD-10-CM | POA: Diagnosis not present

## 2023-08-21 DIAGNOSIS — R262 Difficulty in walking, not elsewhere classified: Secondary | ICD-10-CM | POA: Diagnosis not present

## 2023-08-21 DIAGNOSIS — M6281 Muscle weakness (generalized): Secondary | ICD-10-CM | POA: Diagnosis not present

## 2023-08-21 NOTE — Therapy (Signed)
 OUTPATIENT PHYSICAL THERAPY THORACOLUMBAR TREATMENT/PROGRESS NOTE Progress Note Reporting Period 07/17/2023 to 08/21/2023  See note below for Objective Data and Assessment of Progress/Goals.       Patient Name: Melinda Moore MRN: [id number] DOB:01-28-1971, 53 y.o., female Today's Date: 08/21/2023  END OF SESSION:  PT End of Session - 08/21/23 1101     Visit Number 4    Number of Visits 12    Date for PT Re-Evaluation 09/18/23    Authorization Type [employer]    PT Start Time 1104    PT Stop Time 1144    PT Time Calculation (min) 40 min    Activity Tolerance Patient tolerated treatment well    Behavior During Therapy WFL for tasks assessed/performed             Past Medical History:  Diagnosis Date   Anemia    Anxiety    Bell's palsy    Depression    DVT (deep venous thrombosis) (HCC) 09/02/2012   [place] on 02/13/2012 showed acute DVT in left calf veins and posterior tibial veins   Fibromyalgia    GERD (gastroesophageal reflux disease)    History of blood transfusion 2000   History of trigeminal neuralgia    HTN (hypertension)    patient denies was taking a blood pressure medication in early 2000 but it was migraines   Leukopenia    MS (multiple sclerosis) (HCC) 1997   Neuropathic pain    Peripheral edema    Port catheter in place 12/31/2012   Right bundle branch block    Past Surgical History:  Procedure Laterality Date   ABDOMINAL HYSTERECTOMY     BIOPSY  05/08/2023   Procedure: BIOPSY;  Surgeon: Corbin Ade, MD;  Location: [facility];  Service: Endoscopy;;   CHOLECYSTECTOMY     COLONOSCOPY WITH PROPOFOL N/A 09/27/2021   Procedure: COLONOSCOPY WITH PROPOFOL;  Surgeon: Corbin Ade, MD;  Location: [facility];  Service: Endoscopy;  Laterality: N/A;  8:00am, asa 2   ESOPHAGOGASTRODUODENOSCOPY  2011   Dr. Jena Gauss. 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 Biopsy from the  stomach revealed minimal chronic inactive inflammation but negative for H. pylori   ESOPHAGOGASTRODUODENOSCOPY (EGD) WITH PROPOFOL N/A 05/08/2023   Procedure: ESOPHAGOGASTRODUODENOSCOPY (EGD) WITH PROPOFOL;  Surgeon: Corbin Ade, MD;  Location: [facility];  Service: Endoscopy;  Laterality: N/A;  2:30 pm, asa 3   FLEXIBLE SIGMOIDOSCOPY N/A 02/08/2022   Procedure: FLEXIBLE SIGMOIDOSCOPY;  Surgeon: Corbin Ade, MD;  Location: [facility];  Service: Endoscopy;  Laterality: N/A;   LAPAROSCOPIC GASTRIC SLEEVE RESECTION N/A 04/17/2016   Procedure: LAPAROSCOPIC GASTRIC SLEEVE RESECTION WITH UPPER ENDOSCOPY;  Surgeon: Glenna Fellows, MD;  Location: [facility];  Service: General;  Laterality: N/A;   PATELLA-FEMORAL ARTHROPLASTY Right 07/14/2019   Procedure: PATELLA-FEMORAL ARTHROPLASTY;  Surgeon: Teryl Lucy, MD;  Location: [facility];  Service: Orthopedics;  Laterality: Right;   PORTACATH PLACEMENT     TUBAL LIGATION  2001   Patient Active Problem List   Diagnosis Date Noted   UTI (urinary tract infection), bacterial 03/21/2023   Urinary frequency 02/18/2023   Chronic low back pain (1ry area of Pain) (Bilateral) (R>L) w/o sciatica 07/18/2022   Lumbar facet arthropathy (Bilateral) 07/18/2022   Lumbar facet joint pain 07/18/2022   Chronic lower extremity pain (2ry area of Pain) (Right) 07/18/2022   DDD (degenerative disc disease), lumbosacral 07/18/2022   Abnormal MRI, lumbar spine (  10/11/2017) 07/18/2022   Lumbosacral foraminal stenosis (Left: L5-S1) 07/18/2022   Obesity, Class II, BMI 35-39.9 07/18/2022   Osteoarthritis of facet joint of lumbar spine 07/18/2022   Primary osteoarthritis of lumbar spine 07/18/2022   History of total knee replacement (Right) 07/18/2022   Chronic knee pain s/p TKR (Right) 07/18/2022   Chronic medial knee pain (Right) 07/18/2022   Gastroesophageal reflux disease 09/07/2021   Hx of hysterectomy 06/14/2021   Patellofemoral arthritis of knee (Right) 07/14/2019    History of DVT in adulthood 06/23/2019   Radiculopathy due to lumbar intervertebral disc disorder 09/21/2016   Spondylosis without myelopathy or radiculopathy, lumbar region 09/21/2016   Primary osteoarthritis of knee (Right) 08/30/2016   Primary insomnia 08/30/2016   Myofascial pain 08/30/2016   Chronic anxiety 07/08/2013   Chronic pain syndrome 03/11/2013   Port-A-Cath in place 12/31/2012   Constipation 10/01/2012   MS (multiple sclerosis) (HCC) 09/02/2012   Other dysphagia 04/12/2010    PCP: Babs Sciara, MD  REFERRING PROVIDER: Babs Sciara, MD  REFERRING DIAG: M54.9 (ICD-10-CM) - Back pain, unspecified back location, unspecified back pain laterality, unspecified chronicity  Rationale for Evaluation and Treatment: Rehabilitation  THERAPY DIAG:  Low back pain, unspecified back pain laterality, unspecified chronicity, unspecified whether sciatica present - Plan: PT plan of care cert/re-cert  Difficulty in walking, not elsewhere classified - Plan: PT plan of care cert/re-cert  Other abnormalities of gait and mobility - Plan: PT plan of care cert/re-cert  ONSET DATE: chronic  SUBJECTIVE:                                                                                                                                                                                           SUBJECTIVE STATEMENT: "40 to 50%" better overall; good days and bad days   EVAL:Patient well known to this clinic.  "Same back stiffness; right leg pain"; has not yet been able to start going to the gym or anything yet.  She had a kidney infection at the end of year and had to lay around a lot so she got stiff all over again.    PERTINENT HISTORY:  MS OA right knee  PAIN:  Are you having pain? Yes: NPRS scale: 8/10 Pain location: back and all the way down the front and lateral part of right leg Pain description: back aching and stiff; leg is numb and tingling Aggravating factors: walking and  standing Relieving factors: pain medication , stretching, elevating legs  PRECAUTIONS: None  RED FLAGS: None   WEIGHT BEARING RESTRICTIONS: No  FALLS:  Has patient fallen in last 6 months? No  OCCUPATION: not  working  PLOF: Independent  PATIENT GOALS: stand up straight and walk without being bent over; get off pain medication  NEXT MD VISIT: 08/06/23  OBJECTIVE:  Note: Objective measures were completed at Evaluation unless otherwise noted.  DIAGNOSTIC FINDINGS:  None recently  PATIENT SURVEYS:  Modified Oswestry 18/50  36%   COGNITION: Overall cognitive status: Within functional limits for tasks assessed     SENSATION: Burning and tingling right leg down to lateral ankle   POSTURE: rounded shoulders, forward head, and flexed trunk   PALPATION: General soreness lumbar spine  LUMBAR ROM:   AROM eval  Flexion Fingertips to ankle  Extension Unable to achieve full upright standing posture  Right lateral flexion To mid calf but is flexing at trunk  Left lateral flexion To mid calf but is flexin at trunk  Right rotation   Left rotation    (Blank rows = not tested)  LOWER EXTREMITY ROM:     Active  Right eval Left eval  Hip flexion    Hip extension    Hip abduction    Hip adduction    Hip internal rotation    Hip external rotation    Knee flexion    Knee extension    Ankle dorsiflexion    Ankle plantarflexion    Ankle inversion    Ankle eversion     (Blank rows = not tested)  LOWER EXTREMITY MMT:    MMT Right eval Left eval Right 08/21/23 Left 08/21/23  Hip flexion 4 4+ 4 4+  Hip extension 3- 3 tends to rotate 3- 3-  Hip abduction      Hip adduction      Hip internal rotation      Hip external rotation      Knee flexion 4- 4 4- 4-  Knee extension 4+ 5 5 5   Ankle dorsiflexion 5 5 5 5   Ankle plantarflexion      Ankle inversion      Ankle eversion       (Blank rows = not tested)  FUNCTIONAL TESTS:  5 times sit to stand: 19.61 sec 2  minute walk test: 217 ft (stopped at 1 min and 20 sec due to pain and fatigue)  GAIT: Distance walked: 217 ft Assistive device utilized: None Level of assistance: Modified independence and SBA Comments: forward flexed trunk; decreased heel strike, increased lateral trunk sway  TREATMENT DATE:  08/21/23 Nustep seat 10 level 2 x 5' dynamic warm up Progress note Modified Oswestry 17/50 34% 5 times sit to stand 14.14 sec using hands on thighs 2 MWT 222 ft (stopped with 48 sec left due to pain) Standing lumbar extension x 10 in // bars MMT's see above Prone Manual quad stretching 5 x 20" each   08/06/23 Standing: Lumbar extension over // bar x 10 Heel raises 2 x 10 Slant board 5 x 20" Hip abduction 2  x 10 Hip extension 2 x 10 Modified warrior 2 in // bars; flexing and extending front foot.   Seated: Hamstring stretch with strap 10 x 10" Nustep seat 10 level 2 x 5' conditioning  Sit to stand with round tidal tank 2 x 10 Deadlift with tidal tank with tube x 10  08/02/23 Nustep seat 10 x 5' dynamic warm up Review of HEP and goals Standing: lumbar extension over bar (// bars) Heel raises 2 x 10 Slant board 5 x 20" Hip abduction 2 x 10 Hip extension 2 x 10 At wall; forearms on wall with  hips extending to wall x 10 GTB scapular rows 2 x 10 GTB shoulder extension 2 x 10 Supine: Modified thomas stretch with towel 5 x 20" each Bridge 2 x 10   07/17/23 physical therapy evaluation and HEP instruction     Nustep seat 10 x 5'                                                                                                                             PATIENT EDUCATION:  Education details: Patient educated on exam findings, POC, scope of PT, HEP, and discussion of aquatic exercises; [employer]. Person educated: Patient Education method: Explanation, Demonstration, and Handouts Education comprehension: verbalized understanding, returned demonstration, verbal cues required, and tactile  cues required  HOME EXERCISE PROGRAM: Access Code: [email or web address] URL: [email or web address] Date: 07/17/2023 Prepared by: [facility]  Exercises - Static Prone on Elbows  - 1 x daily - 7 x weekly - 1 sets - 1 reps - 1 min hold - Prone Press Up  - 1 x daily - 7 x weekly - 1 sets - 10 reps - Standing Lumbar Extension with Counter  - 1 x daily - 7 x weekly - 1 sets - 10 reps - Standing Lumbar Extension at Wall - Forearms  - 1 x daily - 7 x weekly - 1 sets - 10 reps  ASSESSMENT:  CLINICAL IMPRESSION: Progress note today; limited change with Modified Oswestry due to limited visits. Good improvement with sit to stand test noted meeting long term goal; minimal change in 2 MWT; noted continued quad tightness/hip flexor tightness so performed some manual stretching in prone; patient only able to tolerate knee flexion to approximately 90 degrees in prone bilaterally.    Patient will benefit from continued skilled therapy services to address  remaining deficits and partially met and unmet goals and promote return to optimal function.       Eval:Patient is a 53 y.o. female who was seen today for physical therapy evaluation and treatment for M54.9 (ICD-10-CM) - Back pain, unspecified back location, unspecified back pain laterality, unspecified chronicity. Patient demonstrates muscle weakness, reduced ROM, and fascial restrictions which are likely contributing to symptoms of pain and are negatively impacting patient ability to perform ADLs and functional mobility tasks. Patient will benefit from skilled physical therapy services to address these deficits to reduce pain and improve level of function with ADLs and functional mobility tasks.   OBJECTIVE IMPAIRMENTS: Abnormal gait, decreased activity tolerance, decreased endurance, decreased mobility, difficulty walking, decreased ROM, decreased strength, hypomobility, increased fascial restrictions, impaired perceived functional ability, impaired  flexibility, postural dysfunction, and pain.   ACTIVITY LIMITATIONS: carrying, lifting, bending, sitting, standing, squatting, stairs, transfers, bed mobility, and locomotion level  PARTICIPATION LIMITATIONS: meal prep, cleaning, laundry, shopping, and community activity  PERSONAL FACTORS: Past/current experiences and 1 comorbidity: MS  are also affecting patient's functional outcome.   REHAB POTENTIAL: Good  CLINICAL DECISION MAKING: Evolving/moderate complexity  EVALUATION COMPLEXITY: Moderate  GOALS: Goals reviewed with patient? No  SHORT TERM GOALS: Target date: 07/31/2023  patient will be independent with initial HEP  Baseline: Goal status: in progress  2.  Patient will report 30% improvement overall  Baseline:  Goal status:met  LONG TERM GOALS: Target date: 08/14/22  Patient will be independent in self management strategies to improve quality of life and functional outcomes.  Baseline:  Goal status: in progress  2.  Patient will report 50% improvement overall  Baseline:  Goal status: in progress  3.  Patient will improve Modified Oswestry score by 8 points to demonstrate improved perceived function   Baseline: 18/50; 17/50 08/21/23 Goal status: In progress  4.  Patient will improve 5 times sit to stand score to 15 sec or less to demonstrate improved functional mobility and increased leg strength.     Baseline: 19.61 sec; 14.41 sec Goal status: met  5.  Patient will increase distance on to 250 ft demonstrate improved functional mobility walking household and community distances.   Baseline: 217 ft; 222 ft 08/21/23 Goal status: in progress  PLAN:  PT FREQUENCY: 2x/week  PT DURATION: 4 weeks  PLANNED INTERVENTIONS: 97164- PT Re-evaluation, 97110-Therapeutic exercises, 97530- Therapeutic activity, 97112- Neuromuscular re-education, 97535- Self Care, 16606- Manual therapy, [phone]- Gait training, [phone]- Orthotic Fit/training, [phone]- Canalith  repositioning, [id number]- Aquatic Therapy, [phone]- Splinting, Patient/Family education, Balance training, Stair training, Taping, Dry Needling, Joint mobilization, Joint manipulation, Spinal manipulation, Spinal mobilization, Scar mobilization, and DME instructions. Marland Kitchen  PLAN FOR NEXT SESSION: postural and lower extremity strengthening; spinal extension; continue 2 x a week for 4 weeks  1:13 PM, 08/21/23 Kelton Bultman Small Vaden Becherer MPT [facility] [place] [phone]

## 2023-08-23 ENCOUNTER — Encounter (HOSPITAL_COMMUNITY): Payer: Self-pay

## 2023-08-23 ENCOUNTER — Ambulatory Visit (HOSPITAL_COMMUNITY): Payer: 59

## 2023-08-23 ENCOUNTER — Ambulatory Visit (INDEPENDENT_AMBULATORY_CARE_PROVIDER_SITE_OTHER): Payer: 59

## 2023-08-23 VITALS — Ht 69.0 in | Wt 262.0 lb

## 2023-08-23 DIAGNOSIS — Z0000 Encounter for general adult medical examination without abnormal findings: Secondary | ICD-10-CM | POA: Diagnosis not present

## 2023-08-23 DIAGNOSIS — M545 Low back pain, unspecified: Secondary | ICD-10-CM

## 2023-08-23 DIAGNOSIS — R262 Difficulty in walking, not elsewhere classified: Secondary | ICD-10-CM

## 2023-08-23 DIAGNOSIS — M6281 Muscle weakness (generalized): Secondary | ICD-10-CM | POA: Diagnosis not present

## 2023-08-23 DIAGNOSIS — R2689 Other abnormalities of gait and mobility: Secondary | ICD-10-CM | POA: Diagnosis not present

## 2023-08-23 NOTE — Therapy (Signed)
 OUTPATIENT PHYSICAL THERAPY THORACOLUMBAR TREATMENT   Patient Name: Melinda Moore MRN: [id number] DOB:March 04, 1971, 53 y.o., female Today's Date: 08/23/2023  END OF SESSION:  PT End of Session - 08/23/23 1149     Visit Number 5    Number of Visits 12    Date for PT Re-Evaluation 09/18/23    Authorization Type [employer]    PT Start Time 1149    PT Stop Time 1232    PT Time Calculation (min) 43 min    Activity Tolerance Patient tolerated treatment well    Behavior During Therapy WFL for tasks assessed/performed             Past Medical History:  Diagnosis Date   Anemia    Anxiety    Bell's palsy    Depression    DVT (deep venous thrombosis) (HCC) 09/02/2012   [place] on 02/13/2012 showed acute DVT in left calf veins and posterior tibial veins   Fibromyalgia    GERD (gastroesophageal reflux disease)    History of blood transfusion 2000   History of trigeminal neuralgia    HTN (hypertension)    patient denies was taking a blood pressure medication in early 2000 but it was migraines   Leukopenia    MS (multiple sclerosis) (HCC) 1997   Neuropathic pain    Peripheral edema    Port catheter in place 12/31/2012   Right bundle branch block    Past Surgical History:  Procedure Laterality Date   ABDOMINAL HYSTERECTOMY     BIOPSY  05/08/2023   Procedure: BIOPSY;  Surgeon: Corbin Ade, MD;  Location: [facility];  Service: Endoscopy;;   CHOLECYSTECTOMY     COLONOSCOPY WITH PROPOFOL N/A 09/27/2021   Procedure: COLONOSCOPY WITH PROPOFOL;  Surgeon: Corbin Ade, MD;  Location: [facility];  Service: Endoscopy;  Laterality: N/A;  8:00am, asa 2   ESOPHAGOGASTRODUODENOSCOPY  2011   Dr. Jena Gauss. 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 Biopsy from the stomach revealed minimal chronic inactive inflammation but negative for H. pylori   ESOPHAGOGASTRODUODENOSCOPY (EGD) WITH PROPOFOL N/A 05/08/2023    Procedure: ESOPHAGOGASTRODUODENOSCOPY (EGD) WITH PROPOFOL;  Surgeon: Corbin Ade, MD;  Location: [facility];  Service: Endoscopy;  Laterality: N/A;  2:30 pm, asa 3   FLEXIBLE SIGMOIDOSCOPY N/A 02/08/2022   Procedure: FLEXIBLE SIGMOIDOSCOPY;  Surgeon: Corbin Ade, MD;  Location: [facility];  Service: Endoscopy;  Laterality: N/A;   LAPAROSCOPIC GASTRIC SLEEVE RESECTION N/A 04/17/2016   Procedure: LAPAROSCOPIC GASTRIC SLEEVE RESECTION WITH UPPER ENDOSCOPY;  Surgeon: Glenna Fellows, MD;  Location: [facility];  Service: General;  Laterality: N/A;   PATELLA-FEMORAL ARTHROPLASTY Right 07/14/2019   Procedure: PATELLA-FEMORAL ARTHROPLASTY;  Surgeon: Teryl Lucy, MD;  Location: [facility];  Service: Orthopedics;  Laterality: Right;   PORTACATH PLACEMENT     TUBAL LIGATION  2001   Patient Active Problem List   Diagnosis Date Noted   UTI (urinary tract infection), bacterial 03/21/2023   Urinary frequency 02/18/2023   Chronic low back pain (1ry area of Pain) (Bilateral) (R>L) w/o sciatica 07/18/2022   Lumbar facet arthropathy (Bilateral) 07/18/2022   Lumbar facet joint pain 07/18/2022   Chronic lower extremity pain (2ry area of Pain) (Right) 07/18/2022   DDD (degenerative disc disease), lumbosacral 07/18/2022   Abnormal MRI, lumbar spine (10/11/2017) 07/18/2022   Lumbosacral foraminal stenosis (Left: L5-S1) 07/18/2022   Obesity, Class II, BMI 35-39.9 07/18/2022   Osteoarthritis of facet  joint of lumbar spine 07/18/2022   Primary osteoarthritis of lumbar spine 07/18/2022   History of total knee replacement (Right) 07/18/2022   Chronic knee pain s/p TKR (Right) 07/18/2022   Chronic medial knee pain (Right) 07/18/2022   Gastroesophageal reflux disease 09/07/2021   Hx of hysterectomy 06/14/2021   Patellofemoral arthritis of knee (Right) 07/14/2019   History of DVT in adulthood 06/23/2019   Radiculopathy due to lumbar intervertebral disc disorder 09/21/2016   Spondylosis without myelopathy  or radiculopathy, lumbar region 09/21/2016   Primary osteoarthritis of knee (Right) 08/30/2016   Primary insomnia 08/30/2016   Myofascial pain 08/30/2016   Chronic anxiety 07/08/2013   Chronic pain syndrome 03/11/2013   Port-A-Cath in place 12/31/2012   Constipation 10/01/2012   MS (multiple sclerosis) (HCC) 09/02/2012   Other dysphagia 04/12/2010    PCP: Babs Sciara, MD  REFERRING PROVIDER: Babs Sciara, MD  REFERRING DIAG: M54.9 (ICD-10-CM) - Back pain, unspecified back location, unspecified back pain laterality, unspecified chronicity  Rationale for Evaluation and Treatment: Rehabilitation  THERAPY DIAG:  Low back pain, unspecified back pain laterality, unspecified chronicity, unspecified whether sciatica present  Difficulty in walking, not elsewhere classified  Other abnormalities of gait and mobility  Muscle weakness (generalized)  ONSET DATE: chronic  SUBJECTIVE:                                                                                                                                                                                           SUBJECTIVE STATEMENT: 08/23/23:  Back is stiff today, no reports of pain.     EVAL:Patient well known to this clinic.  "Same back stiffness; right leg pain"; has not yet been able to start going to the gym or anything yet.  She had a kidney infection at the end of year and had to lay around a lot so she got stiff all over again.    PERTINENT HISTORY:  MS OA right knee  PAIN:  Are you having pain? Yes: NPRS scale: 8/10 Pain location: back and all the way down the front and lateral part of right leg Pain description: back aching and stiff; leg is numb and tingling Aggravating factors: walking and standing Relieving factors: pain medication , stretching, elevating legs  PRECAUTIONS: None  RED FLAGS: None   WEIGHT BEARING RESTRICTIONS: No  FALLS:  Has patient fallen in last 6 months? No  OCCUPATION: not  working  PLOF: Independent  PATIENT GOALS: stand up straight and walk without being bent over; get off pain medication  NEXT MD VISIT: 08/06/23  OBJECTIVE:  Note: Objective measures were completed at Evaluation unless otherwise noted.  DIAGNOSTIC FINDINGS:  None recently  PATIENT SURVEYS:  Modified Oswestry 18/50  36%   COGNITION: Overall cognitive status: Within functional limits for tasks assessed     SENSATION: Burning and tingling right leg down to lateral ankle   POSTURE: rounded shoulders, forward head, and flexed trunk   PALPATION: General soreness lumbar spine  LUMBAR ROM:   AROM eval  Flexion Fingertips to ankle  Extension Unable to achieve full upright standing posture  Right lateral flexion To mid calf but is flexing at trunk  Left lateral flexion To mid calf but is flexin at trunk  Right rotation   Left rotation    (Blank rows = not tested)  LOWER EXTREMITY ROM:     Active  Right eval Left eval  Hip flexion    Hip extension    Hip abduction    Hip adduction    Hip internal rotation    Hip external rotation    Knee flexion    Knee extension    Ankle dorsiflexion    Ankle plantarflexion    Ankle inversion    Ankle eversion     (Blank rows = not tested)  LOWER EXTREMITY MMT:    MMT Right eval Left eval Right 08/21/23 Left 08/21/23  Hip flexion 4 4+ 4 4+  Hip extension 3- 3 tends to rotate 3- 3-  Hip abduction      Hip adduction      Hip internal rotation      Hip external rotation      Knee flexion 4- 4 4- 4-  Knee extension 4+ 5 5 5   Ankle dorsiflexion 5 5 5 5   Ankle plantarflexion      Ankle inversion      Ankle eversion       (Blank rows = not tested)  FUNCTIONAL TESTS:  5 times sit to stand: 19.61 sec 2 minute walk test: 217 ft (stopped at 1 min and 20 sec due to pain and fatigue)  GAIT: Distance walked: 217 ft Assistive device utilized: None Level of assistance: Modified independence and SBA Comments: forward flexed  trunk; decreased heel strike, increased lateral trunk sway  TREATMENT DATE:  08/23/23: Nustep seat 10 level 2 x 5' dynamic warm up POE x Prone quad stretch 3x 30" Prone glut set 10x 3" with partial lift, verbal and tactile cueing to reduce rotation Supine: Bridge 10x Hip flexor stretch on 2nd step 3x 30" Wall arch 10x each    08/21/23 Nustep seat 10 level 2 x 5' dynamic warm up Progress note Modified Oswestry 17/50 34% 5 times sit to stand 14.14 sec using hands on thighs 2 MWT 222 ft (stopped with 48 sec left due to pain) Standing lumbar extension x 10 in // bars MMT's see above Prone Manual quad stretching 5 x 20" each   08/06/23 Standing: Lumbar extension over // bar x 10 Heel raises 2 x 10 Slant board 5 x 20" Hip abduction 2  x 10 Hip extension 2 x 10 Modified warrior 2 in // bars; flexing and extending front foot.   Seated: Hamstring stretch with strap 10 x 10" Nustep seat 10 level 2 x 5' conditioning  Sit to stand with round tidal tank 2 x 10 Deadlift with tidal tank with tube x 10  08/02/23 Nustep seat 10 x 5' dynamic warm up Review of HEP and goals Standing: lumbar extension over bar (// bars) Heel raises 2 x 10 Slant board 5 x 20" Hip abduction 2  x 10 Hip extension 2 x 10 At wall; forearms on wall with hips extending to wall x 10 GTB scapular rows 2 x 10 GTB shoulder extension 2 x 10 Supine: Modified thomas stretch with towel 5 x 20" each Bridge 2 x 10   07/17/23 physical therapy evaluation and HEP instruction     Nustep seat 10 x 5'                                                                                                                             PATIENT EDUCATION:  Education details: Patient educated on exam findings, POC, scope of PT, HEP, and discussion of aquatic exercises; [employer]. Person educated: Patient Education method: Explanation, Demonstration, and Handouts Education comprehension: verbalized understanding, returned  demonstration, verbal cues required, and tactile cues required  HOME EXERCISE PROGRAM: Access Code: [email or web address] URL: [email or web address] Date: 07/17/2023 Prepared by: [facility]  Exercises - Static Prone on Elbows  - 1 x daily - 7 x weekly - 1 sets - 1 reps - 1 min hold - Prone Press Up  - 1 x daily - 7 x weekly - 1 sets - 10 reps - Standing Lumbar Extension with Counter  - 1 x daily - 7 x weekly - 1 sets - 10 reps - Standing Lumbar Extension at Wall - Forearms  - 1 x daily - 7 x weekly - 1 sets - 10 reps  ASSESSMENT:  CLINICAL IMPRESSION: Session focus with lumbar mobility and proximal stregthening.  Added prone SLR and bridges for gluteal strengthening, pt demonstrates extreme weakness noted with inability to complete full range of motion and visible musculature fatigue.  No reports of pain and minimal fatigue levels through session.  Eval:Patient is a 53 y.o. female who was seen today for physical therapy evaluation and treatment for M54.9 (ICD-10-CM) - Back pain, unspecified back location, unspecified back pain laterality, unspecified chronicity. Patient demonstrates muscle weakness, reduced ROM, and fascial restrictions which are likely contributing to symptoms of pain and are negatively impacting patient ability to perform ADLs and functional mobility tasks. Patient will benefit from skilled physical therapy services to address these deficits to reduce pain and improve level of function with ADLs and functional mobility tasks.   OBJECTIVE IMPAIRMENTS: Abnormal gait, decreased activity tolerance, decreased endurance, decreased mobility, difficulty walking, decreased ROM, decreased strength, hypomobility, increased fascial restrictions, impaired perceived functional ability, impaired flexibility, postural dysfunction, and pain.   ACTIVITY LIMITATIONS: carrying, lifting, bending, sitting, standing, squatting, stairs, transfers, bed mobility, and locomotion  level  PARTICIPATION LIMITATIONS: meal prep, cleaning, laundry, shopping, and community activity  PERSONAL FACTORS: Past/current experiences and 1 comorbidity: MS  are also affecting patient's functional outcome.   REHAB POTENTIAL: Good  CLINICAL DECISION MAKING: Evolving/moderate complexity  EVALUATION COMPLEXITY: Moderate   GOALS: Goals reviewed with patient? No  SHORT TERM GOALS: Target date: 07/31/2023  patient will be independent with initial HEP  Baseline: Goal status: in progress  2.  Patient  will report 30% improvement overall  Baseline:  Goal status:met  LONG TERM GOALS: Target date: 08/14/22  Patient will be independent in self management strategies to improve quality of life and functional outcomes.  Baseline:  Goal status: in progress  2.  Patient will report 50% improvement overall  Baseline:  Goal status: in progress  3.  Patient will improve Modified Oswestry score by 8 points to demonstrate improved perceived function   Baseline: 18/50; 17/50 08/21/23 Goal status: In progress  4.  Patient will improve 5 times sit to stand score to 15 sec or less to demonstrate improved functional mobility and increased leg strength.     Baseline: 19.61 sec; 14.41 sec Goal status: met  5.  Patient will increase distance on to 250 ft demonstrate improved functional mobility walking household and community distances.   Baseline: 217 ft; 222 ft 08/21/23 Goal status: in progress  PLAN:  PT FREQUENCY: 2x/week  PT DURATION: 4 weeks  PLANNED INTERVENTIONS: [id number]- PT Re-evaluation, 97110-Therapeutic exercises, 97530- Therapeutic activity, 97112- Neuromuscular re-education, 97535- Self Care, 29562- Manual therapy, [phone]- Gait training, [phone]- Orthotic Fit/training, [phone]- Canalith repositioning, [id number]- Aquatic Therapy, [phone]- Splinting, Patient/Family education, Balance training, Stair training, Taping, Dry Needling, Joint mobilization, Joint manipulation, Spinal  manipulation, Spinal mobilization, Scar mobilization, and DME instructions. Marland Kitchen  PLAN FOR NEXT SESSION: postural and lower extremity strengthening; spinal extension; continue 2 x a week for 4 weeks  Becky Sax, LPTA/CLT; CBIS [phone]  1:18 PM, 08/23/23

## 2023-08-23 NOTE — Progress Notes (Signed)
 Please attest and cosign this visit due to patients primary care provider not being in the office at the time the visit was completed.   Because this visit was a virtual/telehealth visit,  certain criteria was not obtained, such a blood pressure, CBG if applicable, and timed get up and go. Any medications not marked as "taking" were not mentioned during the medication reconciliation part of the visit. Any vitals not documented were not able to be obtained due to this being a telehealth visit or patient was unable to self-report a recent blood pressure reading due to a lack of equipment at home via telehealth. Vitals that have been documented are verbally provided by the patient.   Subjective:   Melinda Moore is a 53 y.o. who presents for a Medicare Wellness preventive visit.  Visit Complete: Virtual I connected with  [name] on 08/23/23 by a audio enabled telemedicine application and verified that I am speaking with the correct person using two identifiers.  Patient Location: Home  Provider Location: Home Office  I discussed the limitations of evaluation and management by telemedicine. The patient expressed understanding and agreed to proceed.  Vital Signs: Because this visit was a virtual/telehealth visit, some criteria may be missing or patient reported. Any vitals not documented were not able to be obtained and vitals that have been documented are patient reported.  VideoError- Librarian, academic were attempted between this provider and patient, however failed, due to patient having technical difficulties OR patient did not have access to video capability.  We continued and completed visit with audio only.   Persons Participating in Visit: Patient.  AWV Questionnaire: No: Patient Medicare AWV questionnaire was not completed prior to this visit.  Cardiac Risk Factors include: advanced age (>32men, >41 women);obesity (BMI >30kg/m2);sedentary  lifestyle;hypertension     Objective:    Today's Vitals   08/23/23 1441  Weight: 262 lb (118.8 kg)  Height: 5\' 9"  (1.753 m)   Body mass index is 38.69 kg/m.     08/23/2023    2:50 PM 07/17/2023    1:02 PM 05/08/2023   12:02 PM 05/07/2023    2:59 PM 02/19/2023    2:25 AM 09/20/2022    2:33 PM 06/22/2022    2:57 PM  Advanced Directives  Does Patient Have a Medical Advance Directive? No No No No No No No  Would patient like information on creating a medical advance directive? No - Patient declined No - Patient declined No - Patient declined No - Patient declined No - Patient declined No - Patient declined No - Patient declined    Current Medications (verified) Outpatient Encounter Medications as of 08/23/2023  Medication Sig   albuterol (VENTOLIN HFA) 108 (90 Base) MCG/ACT inhaler Inhale 2 puffs into the lungs every 4 (four) hours as needed for wheezing or shortness of breath.   ascorbic acid (VITAMIN C) 500 MG tablet Take 1 tablet (500 mg total) by mouth daily.   baclofen (LIORESAL) 10 MG tablet Take 10 mg by mouth 2 (two) times daily.   Bioflavonoid Products (BIOFLEX) TABS Take 2 tablets by mouth daily.   buPROPion (WELLBUTRIN SR) 150 MG 12 hr tablet TAKE ONE TABLET BY MOUTH TWICE A DAY   carbamazepine (TEGRETOL XR) 200 MG 12 hr tablet Take 1 tablet (200 mg total) by mouth 3 (three) times daily as needed.   Cholecalciferol (VITAMIN D3) 125 MCG (5000 UT) TABS Take 20,000 Units by mouth 2 (two) times daily.   gabapentin (  NEURONTIN) 300 MG capsule Take 1,200 mg by mouth 3 (three) times daily.   HYDROmorphone (DILAUDID) 4 MG tablet 1 tablet po qid prn, max use per day is 4 tablets   HYDROmorphone (DILAUDID) 4 MG tablet 1 tablet po qid prn, max use per day is 4 tablets.   HYDROmorphone (DILAUDID) 4 MG tablet 1 qid prn pain caution drowsiness   lansoprazole (PREVACID) 30 MG capsule Take 1 capsule (30 mg total) by mouth 2 (two) times daily before a meal.   methylphenidate (RITALIN LA) 10  MG 24 hr capsule Take 1 capsule (10 mg total) by mouth daily.   Multiple Vitamin (MULTIVITAMIN WITH MINERALS) TABS tablet Take 1 tablet by mouth daily. ALIVE   Multiple Vitamins-Minerals (EMERGEN-C IMMUNE) PACK Take 1 packet by mouth once a week.   Naldemedine Tosylate (SYMPROIC) 0.2 MG TABS TAKE ONE TABLET (0.2MG ) BY MOUTH DAILY   naloxone (NARCAN) 4 MG/0.1ML LIQD nasal spray kit SPRAY INTO NOSE AS DIRECTED FOR ACCIDENTAL OVERDOSE   Ofatumumab (KESIMPTA) 20 MG/0.4ML SOAJ Inject into the skin.   ondansetron (ZOFRAN-ODT) 8 MG disintegrating tablet Take 1 tablet (8 mg total) by mouth every 8 (eight) hours as needed for nausea or vomiting.   phenazopyridine (PYRIDIUM) 200 MG tablet Take 1 tablet (200 mg total) by mouth 3 (three) times daily as needed for pain.   potassium chloride SA (KLOR-CON M) 20 MEQ tablet TAKE ONE (1) TABLET BY MOUTH EVERY DAY   sertraline (ZOLOFT) 50 MG tablet TAKE ONE-HALF TABLET BY MOUTH TWICE A DAY   torsemide (DEMADEX) 20 MG tablet Take 1/2 to 1  tablet po q am prn pedal edema   zinc sulfate 220 (50 Zn) MG capsule Take 1 capsule (220 mg total) by mouth daily.   No facility-administered encounter medications on file as of 08/23/2023.    Allergies (verified) Ambien [zolpidem tartrate] and Tape   History: Past Medical History:  Diagnosis Date   Anemia    Anxiety    Bell's palsy    Depression    DVT (deep venous thrombosis) (HCC) 09/02/2012   [place] on 02/13/2012 showed acute DVT in left calf veins and posterior tibial veins   Fibromyalgia    GERD (gastroesophageal reflux disease)    History of blood transfusion 2000   History of trigeminal neuralgia    HTN (hypertension)    patient denies was taking a blood pressure medication in early 2000 but it was migraines   Leukopenia    MS (multiple sclerosis) (HCC) 1997   Neuropathic pain    Peripheral edema    Port catheter in place 12/31/2012   Right bundle branch block    Past Surgical History:  Procedure Laterality  Date   ABDOMINAL HYSTERECTOMY     BIOPSY  05/08/2023   Procedure: BIOPSY;  Surgeon: Corbin Ade, MD;  Location: [facility];  Service: Endoscopy;;   CHOLECYSTECTOMY     COLONOSCOPY WITH PROPOFOL N/A 09/27/2021   Procedure: COLONOSCOPY WITH PROPOFOL;  Surgeon: Corbin Ade, MD;  Location: [facility];  Service: Endoscopy;  Laterality: N/A;  8:00am, asa 2   ESOPHAGOGASTRODUODENOSCOPY  2011   Dr. Jena Gauss. 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 Biopsy from the stomach revealed minimal chronic inactive inflammation but negative for H. pylori   ESOPHAGOGASTRODUODENOSCOPY (EGD) WITH PROPOFOL N/A 05/08/2023   Procedure: ESOPHAGOGASTRODUODENOSCOPY (EGD) WITH PROPOFOL;  Surgeon: Corbin Ade, MD;  Location: [facility];  Service: Endoscopy;  Laterality: N/A;  2:30 pm, asa 3   FLEXIBLE SIGMOIDOSCOPY N/A 02/08/2022   Procedure: FLEXIBLE SIGMOIDOSCOPY;  Surgeon: Corbin Ade, MD;  Location: [facility];  Service: Endoscopy;  Laterality: N/A;   LAPAROSCOPIC GASTRIC SLEEVE RESECTION N/A 04/17/2016   Procedure: LAPAROSCOPIC GASTRIC SLEEVE RESECTION WITH UPPER ENDOSCOPY;  Surgeon: Glenna Fellows, MD;  Location: [facility];  Service: General;  Laterality: N/A;   PATELLA-FEMORAL ARTHROPLASTY Right 07/14/2019   Procedure: PATELLA-FEMORAL ARTHROPLASTY;  Surgeon: Teryl Lucy, MD;  Location: [facility];  Service: Orthopedics;  Laterality: Right;   PORTACATH PLACEMENT     TUBAL LIGATION  2001   Family History  Problem Relation Age of Onset   Healthy Mother    Heart disease Father    Hyperlipidemia Father    Congestive Heart Failure Father    Hypertension Sister    Colon polyps Sister    Colon polyps Sister    Colon polyps Sister    Colon polyps Sister    Cirrhosis Brother        etoh   Healthy Daughter    Healthy Daughter    Colon cancer Paternal Uncle    Social History   Socioeconomic  History   Marital status: Married    Spouse name: Viviann Spare   Number of children: 2   Years of education: some college   Highest education level: Associate degree: occupational, Scientist, product/process development, or vocational program  Occupational History   Occupation: disabled  Tobacco Use   Smoking status: Former    Current packs/day: 0.00    Types: Cigarettes    Quit date: 1990    Years since quitting: 35.2   Smokeless tobacco: Never   Tobacco comments:    4 a day, quit in 1990  Vaping Use   Vaping status: Never Used  Substance and Sexual Activity   Alcohol use: No   Drug use: No   Sexual activity: Not Currently    Birth control/protection: Surgical  Other Topics Concern   Not on file  Social History Narrative   Lives at home with husband and 2 daughters. 3 grand daughters.    Right handed   Takes 1/2 caffeine pill per day   Social Drivers of Health   Financial Resource Strain: Low Risk  (08/23/2023)   Overall Financial Resource Strain (CARDIA)    Difficulty of Paying Living Expenses: Not hard at all  Food Insecurity: No Food Insecurity (08/23/2023)   Hunger Vital Sign    Worried About Running Out of Food in the Last Year: Never true    Ran Out of Food in the Last Year: Never true  Transportation Needs: No Transportation Needs (08/23/2023)   PRAPARE - Administrator, Civil Service (Medical): No    Lack of Transportation (Non-Medical): No  Physical Activity: Patient Declined (08/23/2023)   Exercise Vital Sign    Days of Exercise per Week: Patient declined    Minutes of Exercise per Session: Patient declined  Stress: No Stress Concern Present (08/23/2023)   [employer] of Occupational Health - Occupational Stress Questionnaire    Feeling of Stress : Not at all  Social Connections: Moderately Integrated (08/23/2023)   Social Connection and Isolation Panel [NHANES]    Frequency of Communication with Friends and Family: More than three times a week    Frequency of Social  Gatherings with Friends and Family: Once a week    Attends Religious Services: More than 4 times per year    Active Member of Clubs or  Organizations: No    Attends Engineer, structural: Never    Marital Status: Married    Tobacco Counseling Counseling given: Yes Tobacco comments: 4 a day, quit in 1990    Clinical Intake:  Pre-visit preparation completed: Yes  Pain : No/denies pain     BMI - recorded: 38.69 Nutritional Status: BMI > 30  Obese Nutritional Risks: None Diabetes: No  How often do you need to have someone help you when you read instructions, pamphlets, or other written materials from your doctor or pharmacy?: 1 - Never  Interpreter Needed?: No  Information entered by :: Maryjean Ka CMA   Activities of Daily Living     08/23/2023    2:50 PM 05/07/2023    2:59 PM  In your present state of health, do you have any difficulty performing the following activities:  Hearing? 0 0  Vision? 0 0  Difficulty concentrating or making decisions? 0 0  Walking or climbing stairs? 0   Dressing or bathing? 0   Doing errands, shopping? 0   Preparing Food and eating ? N   Using the Toilet? N   In the past six months, have you accidently leaked urine? N   Do you have problems with loss of bowel control? N   Managing your Medications? N   Managing your Finances? N   Housekeeping or managing your Housekeeping? N     Patient Care Team: Babs Sciara, MD as PCP - General (Family Medicine) Jena Gauss Gerrit Friends, MD as Attending Physician (Gastroenterology) Daisy Lazar, DO (Optometry) Harlen Labs, MD as Referring Physician (Neurology)  Indicate any recent Medical Services you may have received from other than [facility] providers in the past year (date may be approximate).     Assessment:   This is a routine wellness examination for [place].  Hearing/Vision screen Hearing Screening - Comments:: Patient denies any hearing difficulties.   Vision Screening - Comments:: Wears rx  glasses - up to date with routine eye exams  Patient sees Dr. Daisy Lazar w/ [facility] [place] office.     Goals Addressed   None    Depression Screen     08/23/2023    2:51 PM 08/06/2023    1:48 PM 05/30/2023    4:01 PM 05/08/2023   11:18 AM 02/18/2023    4:11 PM 02/08/2023    1:40 PM 11/30/2022    2:37 PM  PHQ 2/9 Scores  PHQ - 2 Score 0 0 0 0 0 0 0  PHQ- 9 Score 1 1 0 0 0 1 0    Fall Risk     08/23/2023    2:49 PM 08/06/2023    1:48 PM 05/30/2023    4:01 PM 05/08/2023   11:18 AM 02/18/2023    4:11 PM  Fall Risk   Falls in the past year? 1 1 1 1 1   Number falls in past yr: 0 0 0 0 0  Injury with Fall? 0 0 0 0   Risk for fall due to : Impaired balance/gait;Impaired mobility   History of fall(s)   Follow up Education provided;Falls prevention discussed   Falls evaluation completed     MEDICARE RISK AT HOME:  Medicare Risk at Home Any stairs in or around the home?: Yes If so, are there any without handrails?: No Home free of loose throw rugs in walkways, pet beds, electrical cords, etc?: Yes Adequate lighting in your home to reduce risk of falls?: Yes Use of a cane,  walker or w/c?: Yes (as needed) Grab bars in the bathroom?: No Shower chair or bench in shower?: Yes Elevated toilet seat or a handicapped toilet?: Yes  TIMED UP AND GO:  Was the test performed?  No  Cognitive Function: 6CIT completed        08/23/2023    2:49 PM 06/22/2022    2:58 PM  6CIT Screen  What Year? 0 points 0 points  What month? 0 points 0 points  What time? 0 points 0 points  Count back from 20 0 points 0 points  Months in reverse 0 points 0 points  Repeat phrase 0 points 0 points  Total Score 0 points 0 points    Immunizations Immunization History  Administered Date(s) Administered   Influenza Split 04/06/2013   Influenza, Seasonal, Injecte, Preservative Fre 03/20/2023   Influenza,inj,Quad PF,6+ Mos 05/24/2015, 03/16/2016, 03/25/2017, 03/17/2018   Influenza-Unspecified  03/10/2012, 03/31/2014, 03/25/2021, 03/24/2022   Moderna Sars-Covid-2 Vaccination 03/25/2020, 04/15/2020, 07/15/2020   PNEUMOCOCCAL CONJUGATE-20 02/14/2022   Pneumococcal-Unspecified 03/21/2006   Tdap 04/18/2018   Zoster Recombinant(Shingrix) 04/20/2021, 08/18/2021   Zoster, Unspecified 03/14/2021    Screening Tests Health Maintenance  Topic Date Due   COVID-19 Vaccine (3 - Moderna risk series) 08/12/2020   MAMMOGRAM  04/02/2024   Medicare Annual Wellness (AWV)  08/22/2024   Fecal DNA (Cologuard)  11/27/2025   DTaP/Tdap/Td (2 - Td or Tdap) 04/18/2028   INFLUENZA VACCINE  Completed   Hepatitis C Screening  Completed   HIV Screening  Completed   Zoster Vaccines- Shingrix  Completed   Pneumococcal Vaccine 48-60 Years old  Aged Out   HPV VACCINES  Aged Out   Colonoscopy  Discontinued    Health Maintenance  Health Maintenance Due  Topic Date Due   COVID-19 Vaccine (3 - Moderna risk series) 08/12/2020   Health Maintenance Items Addressed: Health Maintenance is up to date.   Additional Screening:  Vision Screening: Recommended annual ophthalmology exams for early detection of glaucoma and other disorders of the eye.  Dental Screening: Recommended annual dental exams for proper oral hygiene  Community Resource Referral / Chronic Care Management: CRR required this visit?  No   CCM required this visit?  No     Plan:     I have personally reviewed and noted the following in the patient's chart:   Medical and social history Use of alcohol, tobacco or illicit drugs  Current medications and supplements including opioid prescriptions. Patient is currently taking opioid prescriptions. Information provided to patient regarding non-opioid alternatives. Patient advised to discuss non-opioid treatment plan with their provider. Functional ability and status Nutritional status Physical activity Advanced directives List of other physicians Hospitalizations, surgeries, and ER  visits in previous 12 months Vitals Screenings to include cognitive, depression, and falls Referrals and appointments  In addition, I have reviewed and discussed with patient certain preventive protocols, quality metrics, and best practice recommendations. A written personalized care plan for preventive services as well as general preventive health recommendations were provided to patient.     Jordan Hawks Delila Kuklinski, CMA   08/23/2023   After Visit Summary: (MyChart) Due to this being a telephonic visit, the after visit summary with patients personalized plan was offered to patient via MyChart   Notes: Nothing significant to report at this time.

## 2023-08-23 NOTE — Patient Instructions (Signed)
 [name] , Thank you for taking time to come for your Medicare Wellness Visit. I appreciate your ongoing commitment to your health goals. Please review the following plan we discussed and let me know if I can assist you in the future.   Referrals/Orders/Follow-Ups/Clinician Recommendations:   Next Medicare Annual Wellness Visit:  August 28, 2024 at 2:20 pm video visit.   This is a list of the screening recommended for you and due dates:  Health Maintenance  Topic Date Due   COVID-19 Vaccine (3 - Moderna risk series) 08/12/2020   Mammogram  04/02/2024   Medicare Annual Wellness Visit  08/22/2024   Cologuard (Stool DNA test)  11/27/2025   DTaP/Tdap/Td vaccine (2 - Td or Tdap) 04/18/2028   Flu Shot  Completed   Hepatitis C Screening  Completed   HIV Screening  Completed   Zoster (Shingles) Vaccine  Completed   Pneumococcal Vaccination  Aged Out   HPV Vaccine  Aged Out   Colon Cancer Screening  Discontinued    Advanced directives: (ACP Link)Information on Advanced Care Planning can be found at [facility] [employer] Advance Health Care Directives Advance Health Care Directives. [email or web address]   Next Medicare Annual Wellness Visit scheduled for next year: yes  Understanding Your Risk for Falls Millions of people have serious injuries from falls each year. It is important to understand your risk of falling. Talk with your health care provider about your risk and what you can do to lower it. If you do have a serious fall, make sure to tell your provider. Falling once raises your risk of falling again. How can falls affect me? Serious injuries from falls are common. These include: Broken bones, such as hip fractures. Head injuries, such as traumatic brain injuries (TBI) or concussions. A fear of falling can cause you to avoid activities and stay at home. This can make your muscles weaker and raise your risk for a fall. What can increase my risk? There are a number of risk factors  that increase your risk for falling. The more risk factors you have, the higher your risk of falling. Serious injuries from a fall happen most often to people who are older than 53 years old. Teenagers and young adults ages 16-29 are also at higher risk. Common risk factors include: Weakness in the lower body. Being generally weak or confused due to long-term (chronic) illness. Dizziness or balance problems. Poor vision. Medicines that cause dizziness or drowsiness. These may include: Medicines for your blood pressure, heart, anxiety, insomnia, or swelling (edema). Pain medicines. Muscle relaxants. Other risk factors include: Drinking alcohol. Having had a fall in the past. Having foot pain or wearing improper footwear. Working at a dangerous job. Having any of the following in your home: Tripping hazards, such as floor clutter or loose rugs. Poor lighting. Pets. Having dementia or memory loss. What actions can I take to lower my risk of falling?     Physical activity Stay physically fit. Do strength and balance exercises. Consider taking a regular class to build strength and balance. Yoga and tai chi are good options. Vision Have your eyes checked every year and your prescription for glasses or contacts updated as needed. Shoes and walking aids Wear non-skid shoes. Wear shoes that have rubber soles and low heels. Do not wear high heels. Do not walk around the house in socks or slippers. Use a cane or walker as told by your provider. Home safety Attach secure railings on both sides of  your stairs. Install grab bars for your bathtub, shower, and toilet. Use a non-skid mat in your bathtub or shower. Attach bath mats securely with double-sided, non-slip rug tape. Use good lighting in all rooms. Keep a flashlight near your bed. Make sure there is a clear path from your bed to the bathroom. Use night-lights. Do not use throw rugs. Make sure all carpeting is taped or tacked down  securely. Remove all clutter from walkways and stairways, including extension cords. Repair uneven or broken steps and floors. Avoid walking on icy or slippery surfaces. Walk on the grass instead of on icy or slick sidewalks. Use ice melter to get rid of ice on walkways in the winter. Use a cordless phone. Questions to ask your health care provider Can you help me check my risk for a fall? Do any of my medicines make me more likely to fall? Should I take a vitamin D supplement? What exercises can I do to improve my strength and balance? Should I make an appointment to have my vision checked? Do I need a bone density test to check for weak bones (osteoporosis)? Would it help to use a cane or a walker? Where to find more information Centers for Disease Control and Prevention, STEADI: TonerPromos.no Community-Based Fall Prevention Programs: TonerPromos.no [employer] on Aging: BaseRingTones.pl Contact a health care provider if: You fall at home. You are afraid of falling at home. You feel weak, drowsy, or dizzy. This information is not intended to replace advice given to you by your health care provider. Make sure you discuss any questions you have with your health care provider. Document Revised: 01/29/2022 Document Reviewed: 01/29/2022 Elsevier Patient Education  2024 Else[employer] Inc[employer]   Managing Pain Without Opioids Opioids are strong medicines used to treat moderate to severe pain. For some people, especially those who have long-term (chronic) pain, opioids may not be the best choice for pain management due to: Side effects like nausea, constipation, and sleepiness. The risk of addiction (opioid use disorder). The longer you take opioids, the greater your risk of addiction. Pain that lasts for more than 3 months is called chronic pain. Managing chronic pain usually requires more than one approach and is often provided by a team of health care providers working together (multidisciplinary approach).  Pain management may be done at a pain management center or pain clinic. How to manage pain without the use of opioids Use non-opioid medicines Non-opioid medicines for pain may include: Over-the-counter or prescription non-steroidal anti-inflammatory drugs (NSAIDs). These may be the first medicines used for pain. They work well for muscle and bone pain, and they reduce swelling. Acetaminophen. This over-the-counter medicine may work well for milder pain but not swelling. Antidepressants. These may be used to treat chronic pain. A certain type of antidepressant (tricyclics) is often used. These medicines are given in lower doses for pain than when used for depression. Anticonvulsants. These are usually used to treat seizures but may also reduce nerve (neuropathic) pain. Muscle relaxants. These relieve pain caused by sudden muscle tightening (spasms). You may also use a pain medicine that is applied to the skin as a patch, cream, or gel (topical analgesic), such as a numbing medicine. These may cause fewer side effects than medicines taken by mouth. Do certain therapies as directed Some therapies can help with pain management. They include: Physical therapy. You will do exercises to gain strength and flexibility. A physical therapist may teach you exercises to move and stretch parts of your body that  are weak, stiff, or painful. You can learn these exercises at physical therapy visits and practice them at home. Physical therapy may also involve: Massage. Heat wraps or applying heat or cold to affected areas. Electrical signals that interrupt pain signals (transcutaneous electrical nerve stimulation, TENS). Weak lasers that reduce pain and swelling (low-level laser therapy). Signals from your body that help you learn to regulate pain (biofeedback). Occupational therapy. This helps you to learn ways to function at home and work with less pain. Recreational therapy. This involves trying new activities  or hobbies, such as a physical activity or drawing. Mental health therapy, including: Cognitive behavioral therapy (CBT). This helps you learn coping skills for dealing with pain. Acceptance and commitment therapy (ACT) to change the way you think and react to pain. Relaxation therapies, including muscle relaxation exercises and mindfulness-based stress reduction. Pain management counseling. This may be individual, family, or group counseling.  Receive medical treatments Medical treatments for pain management include: Nerve block injections. These may include a pain blocker and anti-inflammatory medicines. You may have injections: Near the spine to relieve chronic back or neck pain. Into joints to relieve back or joint pain. Into nerve areas that supply a painful area to relieve body pain. Into muscles (trigger point injections) to relieve some painful muscle conditions. A medical device placed near your spine to help block pain signals and relieve nerve pain or chronic back pain (spinal cord stimulation device). Acupuncture. Follow these instructions at home Medicines Take over-the-counter and prescription medicines only as told by your health care provider. If you are taking pain medicine, ask your health care providers about possible side effects to watch out for. Do not drive or use heavy machinery while taking prescription opioid pain medicine. Lifestyle  Do not use drugs or alcohol to reduce pain. If you drink alcohol, limit how much you have to: 0-1 drink a day for women who are not pregnant. 0-2 drinks a day for men. Know how much alcohol is in a drink. In the [place] one drink equals one 12 oz bottle of beer (355 mL), one 5 oz glass of wine (148 mL), or one 1 oz glass of hard liquor (44 mL). Do not use any products that contain nicotine or tobacco. These products include cigarettes, chewing tobacco, and vaping devices, such as e-cigarettes. If you need help quitting, ask your  health care provider. Eat a healthy diet and maintain a healthy weight. Poor diet and excess weight may make pain worse. Eat foods that are high in fiber. These include fresh fruits and vegetables, whole grains, and beans. Limit foods that are high in fat and processed sugars, such as fried and sweet foods. Exercise regularly. Exercise lowers stress and may help relieve pain. Ask your health care provider what activities and exercises are safe for you. If your health care provider approves, join an exercise class that combines movement and stress reduction. Examples include yoga and tai chi. Get enough sleep. Lack of sleep may make pain worse. Lower stress as much as possible. Practice stress reduction techniques as told by your therapist. General instructions Work with all your pain management providers to find the treatments that work best for you. You are an important member of your pain management team. There are many things you can do to reduce pain on your own. Consider joining an online or in-person support group for people who have chronic pain. Keep all follow-up visits. This is important. Where to find more information You can find  more information about managing pain without opioids from: American Academy of Pain Medicine: painmed.org Institute for Chronic Pain: instituteforchronicpain.org American Chronic Pain Association: theacpa.org Contact a health care provider if: You have side effects from pain medicine. Your pain gets worse or does not get better with treatments or home therapy. You are struggling with anxiety or depression. Summary Many types of pain can be managed without opioids. Chronic pain may respond better to pain management without opioids. Pain is best managed when you and a team of health care providers work together. Pain management without opioids may include non-opioid medicines, medical treatments, physical therapy, mental health therapy, and lifestyle  changes. Tell your health care providers if your pain gets worse or is not being managed well enough. This information is not intended to replace advice given to you by your health care provider. Make sure you discuss any questions you have with your health care provider. Document Revised: 09/07/2020 Document Reviewed: 09/07/2020 Elsevier Patient Education  2024 [employer].

## 2023-09-04 ENCOUNTER — Encounter (HOSPITAL_COMMUNITY)

## 2023-09-11 ENCOUNTER — Encounter (HOSPITAL_COMMUNITY)

## 2023-09-13 ENCOUNTER — Ambulatory Visit (HOSPITAL_COMMUNITY): Attending: Family Medicine

## 2023-09-13 ENCOUNTER — Encounter (HOSPITAL_COMMUNITY): Payer: Self-pay

## 2023-09-13 DIAGNOSIS — G35 Multiple sclerosis: Secondary | ICD-10-CM | POA: Diagnosis not present

## 2023-09-13 DIAGNOSIS — R2689 Other abnormalities of gait and mobility: Secondary | ICD-10-CM | POA: Diagnosis not present

## 2023-09-13 DIAGNOSIS — M6281 Muscle weakness (generalized): Secondary | ICD-10-CM | POA: Insufficient documentation

## 2023-09-13 DIAGNOSIS — M545 Low back pain, unspecified: Secondary | ICD-10-CM | POA: Diagnosis not present

## 2023-09-13 DIAGNOSIS — R262 Difficulty in walking, not elsewhere classified: Secondary | ICD-10-CM | POA: Diagnosis not present

## 2023-09-13 NOTE — Therapy (Signed)
 OUTPATIENT PHYSICAL THERAPY THORACOLUMBAR TREATMENT   Patient Name: Melinda Moore MRN: [id number] DOB:07/27/1970, 53 y.o., female Today's Date: 09/13/2023  END OF SESSION:  PT End of Session - 09/13/23 1109     Visit Number 6    Number of Visits 12    Date for PT Re-Evaluation 09/18/23    Authorization Type [employer]    PT Start Time 1103    PT Stop Time 1142    PT Time Calculation (min) 39 min    Activity Tolerance Patient tolerated treatment well    Behavior During Therapy WFL for tasks assessed/performed             Past Medical History:  Diagnosis Date   Anemia    Anxiety    Bell's palsy    Depression    DVT (deep venous thrombosis) (HCC) 09/02/2012   [place] on 02/13/2012 showed acute DVT in left calf veins and posterior tibial veins   Fibromyalgia    GERD (gastroesophageal reflux disease)    History of blood transfusion 2000   History of trigeminal neuralgia    HTN (hypertension)    patient denies was taking a blood pressure medication in early 2000 but it was migraines   Leukopenia    MS (multiple sclerosis) (HCC) 1997   Neuropathic pain    Peripheral edema    Port catheter in place 12/31/2012   Right bundle branch block    Past Surgical History:  Procedure Laterality Date   ABDOMINAL HYSTERECTOMY     BIOPSY  05/08/2023   Procedure: BIOPSY;  Surgeon: Corbin Ade, MD;  Location: [facility];  Service: Endoscopy;;   CHOLECYSTECTOMY     COLONOSCOPY WITH PROPOFOL N/A 09/27/2021   Procedure: COLONOSCOPY WITH PROPOFOL;  Surgeon: Corbin Ade, MD;  Location: [facility];  Service: Endoscopy;  Laterality: N/A;  8:00am, asa 2   ESOPHAGOGASTRODUODENOSCOPY  2011   Dr. Jena Gauss. 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 Biopsy from the stomach revealed minimal chronic inactive inflammation but negative for H. pylori   ESOPHAGOGASTRODUODENOSCOPY (EGD) WITH PROPOFOL N/A 05/08/2023    Procedure: ESOPHAGOGASTRODUODENOSCOPY (EGD) WITH PROPOFOL;  Surgeon: Corbin Ade, MD;  Location: [facility];  Service: Endoscopy;  Laterality: N/A;  2:30 pm, asa 3   FLEXIBLE SIGMOIDOSCOPY N/A 02/08/2022   Procedure: FLEXIBLE SIGMOIDOSCOPY;  Surgeon: Corbin Ade, MD;  Location: [facility];  Service: Endoscopy;  Laterality: N/A;   LAPAROSCOPIC GASTRIC SLEEVE RESECTION N/A 04/17/2016   Procedure: LAPAROSCOPIC GASTRIC SLEEVE RESECTION WITH UPPER ENDOSCOPY;  Surgeon: Glenna Fellows, MD;  Location: [facility];  Service: General;  Laterality: N/A;   PATELLA-FEMORAL ARTHROPLASTY Right 07/14/2019   Procedure: PATELLA-FEMORAL ARTHROPLASTY;  Surgeon: Teryl Lucy, MD;  Location: [facility];  Service: Orthopedics;  Laterality: Right;   PORTACATH PLACEMENT     TUBAL LIGATION  2001   Patient Active Problem List   Diagnosis Date Noted   UTI (urinary tract infection), bacterial 03/21/2023   Urinary frequency 02/18/2023   Chronic low back pain (1ry area of Pain) (Bilateral) (R>L) w/o sciatica 07/18/2022   Lumbar facet arthropathy (Bilateral) 07/18/2022   Lumbar facet joint pain 07/18/2022   Chronic lower extremity pain (2ry area of Pain) (Right) 07/18/2022   DDD (degenerative disc disease), lumbosacral 07/18/2022   Abnormal MRI, lumbar spine (10/11/2017) 07/18/2022   Lumbosacral foraminal stenosis (Left: L5-S1) 07/18/2022   Obesity, Class II, BMI 35-39.9 07/18/2022   Osteoarthritis of facet  joint of lumbar spine 07/18/2022   Primary osteoarthritis of lumbar spine 07/18/2022   History of total knee replacement (Right) 07/18/2022   Chronic knee pain s/p TKR (Right) 07/18/2022   Chronic medial knee pain (Right) 07/18/2022   Gastroesophageal reflux disease 09/07/2021   Hx of hysterectomy 06/14/2021   Patellofemoral arthritis of knee (Right) 07/14/2019   History of DVT in adulthood 06/23/2019   Radiculopathy due to lumbar intervertebral disc disorder 09/21/2016   Spondylosis without myelopathy or  radiculopathy, lumbar region 09/21/2016   Primary osteoarthritis of knee (Right) 08/30/2016   Primary insomnia 08/30/2016   Myofascial pain 08/30/2016   Chronic anxiety 07/08/2013   Chronic pain syndrome 03/11/2013   Port-A-Cath in place 12/31/2012   Constipation 10/01/2012   MS (multiple sclerosis) (HCC) 09/02/2012   Other dysphagia 04/12/2010    PCP: Babs Sciara, MD  REFERRING PROVIDER: Babs Sciara, MD  REFERRING DIAG: M54.9 (ICD-10-CM) - Back pain, unspecified back location, unspecified back pain laterality, unspecified chronicity  Rationale for Evaluation and Treatment: Rehabilitation  THERAPY DIAG:  Low back pain, unspecified back pain laterality, unspecified chronicity, unspecified whether sciatica present  Difficulty in walking, not elsewhere classified  Other abnormalities of gait and mobility  Muscle weakness (generalized)  ONSET DATE: chronic  SUBJECTIVE:                                                                                                                                                                                           SUBJECTIVE STATEMENT: 09/13/23:  Back is stiff today, no reports of pain.     EVAL:Patient well known to this clinic.  "Same back stiffness; right leg pain"; has not yet been able to start going to the gym or anything yet.  She had a kidney infection at the end of year and had to lay around a lot so she got stiff all over again.    PERTINENT HISTORY:  MS OA right knee  PAIN:  Are you having pain? Yes: NPRS scale: 8/10 Pain location: back and all the way down the front and lateral part of right leg Pain description: back aching and stiff; leg is numb and tingling Aggravating factors: walking and standing Relieving factors: pain medication , stretching, elevating legs  PRECAUTIONS: None  RED FLAGS: None   WEIGHT BEARING RESTRICTIONS: No  FALLS:  Has patient fallen in last 6 months? No  OCCUPATION: not  working  PLOF: Independent  PATIENT GOALS: stand up straight and walk without being bent over; get off pain medication  NEXT MD VISIT: 08/06/23  OBJECTIVE:  Note: Objective measures were completed at Evaluation unless otherwise noted.  DIAGNOSTIC FINDINGS:  None recently  PATIENT SURVEYS:  Modified Oswestry 18/50  36%   COGNITION: Overall cognitive status: Within functional limits for tasks assessed     SENSATION: Burning and tingling right leg down to lateral ankle   POSTURE: rounded shoulders, forward head, and flexed trunk   PALPATION: General soreness lumbar spine  LUMBAR ROM:   AROM eval  Flexion Fingertips to ankle  Extension Unable to achieve full upright standing posture  Right lateral flexion To mid calf but is flexing at trunk  Left lateral flexion To mid calf but is flexin at trunk  Right rotation   Left rotation    (Blank rows = not tested)  LOWER EXTREMITY ROM:     Active  Right eval Left eval  Hip flexion    Hip extension    Hip abduction    Hip adduction    Hip internal rotation    Hip external rotation    Knee flexion    Knee extension    Ankle dorsiflexion    Ankle plantarflexion    Ankle inversion    Ankle eversion     (Blank rows = not tested)  LOWER EXTREMITY MMT:    MMT Right eval Left eval Right 08/21/23 Left 08/21/23  Hip flexion 4 4+ 4 4+  Hip extension 3- 3 tends to rotate 3- 3-  Hip abduction      Hip adduction      Hip internal rotation      Hip external rotation      Knee flexion 4- 4 4- 4-  Knee extension 4+ 5 5 5   Ankle dorsiflexion 5 5 5 5   Ankle plantarflexion      Ankle inversion      Ankle eversion       (Blank rows = not tested)  FUNCTIONAL TESTS:  5 times sit to stand: 19.61 sec 2 minute walk test: 217 ft (stopped at 1 min and 20 sec due to pain and fatigue)  GAIT: Distance walked: 217 ft Assistive device utilized: None Level of assistance: Modified independence and SBA Comments: forward flexed  trunk; decreased heel strike, increased lateral trunk sway  TREATMENT DATE:  09/13/23:  Standing:  Lumbar extension front of // bars 8 reps 10" holds Squat front of mat 10x Wall push ups 10x  Quadruped: Cat/camel 5x 10" Child's pose 3x 30" UE flexion 5x 3" LE extension 5x 3" holds  Prone POE x 2 min Pressup 3x 10" Quad stretch with rope 3x 30"  Seated: Sit to stand 10x no HHA to standing extension   08/23/23: Nustep seat 10 level 2 x 5' dynamic warm up POE x Prone quad stretch 3x 30" Prone glut set 10x 3" with partial lift, verbal and tactile cueing to reduce rotation Supine: Bridge 10x Hip flexor stretch on 2nd step 3x 30" Wall arch 10x each    08/21/23 Nustep seat 10 level 2 x 5' dynamic warm up Progress note Modified Oswestry 17/50 34% 5 times sit to stand 14.14 sec using hands on thighs 2 MWT 222 ft (stopped with 48 sec left due to pain) Standing lumbar extension x 10 in // bars MMT's see above Prone Manual quad stretching 5 x 20" each   08/06/23 Standing: Lumbar extension over // bar x 10 Heel raises 2 x 10 Slant board 5 x 20" Hip abduction 2  x 10 Hip extension 2 x 10 Modified warrior 2 in // bars; flexing and extending front foot.   Seated:  Hamstring stretch with strap 10 x 10" Nustep seat 10 level 2 x 5' conditioning  Sit to stand with round tidal tank 2 x 10 Deadlift with tidal tank with tube x 10  08/02/23 Nustep seat 10 x 5' dynamic warm up Review of HEP and goals Standing: lumbar extension over bar (// bars) Heel raises 2 x 10 Slant board 5 x 20" Hip abduction 2 x 10 Hip extension 2 x 10 At wall; forearms on wall with hips extending to wall x 10 GTB scapular rows 2 x 10 GTB shoulder extension 2 x 10 Supine: Modified thomas stretch with towel 5 x 20" each Bridge 2 x 10   07/17/23 physical therapy evaluation and HEP instruction     Nustep seat 10 x 5'                                                                                                                              PATIENT EDUCATION:  Education details: Patient educated on exam findings, POC, scope of PT, HEP, and discussion of aquatic exercises; [employer]. Person educated: Patient Education method: Explanation, Demonstration, and Handouts Education comprehension: verbalized understanding, returned demonstration, verbal cues required, and tactile cues required  HOME EXERCISE PROGRAM: Access Code: [email or web address] URL: [email or web address] Date: 07/17/2023 Prepared by: [facility]  Exercises - Static Prone on Elbows  - 1 x daily - 7 x weekly - 1 sets - 1 reps - 1 min hold - Prone Press Up  - 1 x daily - 7 x weekly - 1 sets - 10 reps - Standing Lumbar Extension with Counter  - 1 x daily - 7 x weekly - 1 sets - 10 reps - Standing Lumbar Extension at Wall - Forearms  - 1 x daily - 7 x weekly - 1 sets - 10 reps  ASSESSMENT:  CLINICAL IMPRESSION: Continued session focus with lumbar mobility and proximal strengthening.  Added quadruped exercises for lumbar mobility and core/proximal strengthening that was tolerated well and added to HEP.  Pt presents with increased UE fatigue in position with a couple of rest breaks taken this session.  No reports of pain through session and minimal reports of fatigue.    Eval:Patient is a 53 y.o. female who was seen today for physical therapy evaluation and treatment for M54.9 (ICD-10-CM) - Back pain, unspecified back location, unspecified back pain laterality, unspecified chronicity. Patient demonstrates muscle weakness, reduced ROM, and fascial restrictions which are likely contributing to symptoms of pain and are negatively impacting patient ability to perform ADLs and functional mobility tasks. Patient will benefit from skilled physical therapy services to address these deficits to reduce pain and improve level of function with ADLs and functional mobility tasks.   OBJECTIVE IMPAIRMENTS: Abnormal gait, decreased activity  tolerance, decreased endurance, decreased mobility, difficulty walking, decreased ROM, decreased strength, hypomobility, increased fascial restrictions, impaired perceived functional ability, impaired flexibility, postural dysfunction, and pain.   ACTIVITY LIMITATIONS: carrying, lifting, bending, sitting, standing,  squatting, stairs, transfers, bed mobility, and locomotion level  PARTICIPATION LIMITATIONS: meal prep, cleaning, laundry, shopping, and community activity  PERSONAL FACTORS: Past/current experiences and 1 comorbidity: MS  are also affecting patient's functional outcome.   REHAB POTENTIAL: Good  CLINICAL DECISION MAKING: Evolving/moderate complexity  EVALUATION COMPLEXITY: Moderate   GOALS: Goals reviewed with patient? No  SHORT TERM GOALS: Target date: 07/31/2023  patient will be independent with initial HEP  Baseline: Goal status: in progress  2.  Patient will report 30% improvement overall  Baseline:  Goal status:met  LONG TERM GOALS: Target date: 08/14/22  Patient will be independent in self management strategies to improve quality of life and functional outcomes.  Baseline:  Goal status: in progress  2.  Patient will report 50% improvement overall  Baseline:  Goal status: in progress  3.  Patient will improve Modified Oswestry score by 8 points to demonstrate improved perceived function   Baseline: 18/50; 17/50 08/21/23 Goal status: In progress  4.  Patient will improve 5 times sit to stand score to 15 sec or less to demonstrate improved functional mobility and increased leg strength.     Baseline: 19.61 sec; 14.41 sec Goal status: met  5.  Patient will increase distance on to 250 ft demonstrate improved functional mobility walking household and community distances.   Baseline: 217 ft; 222 ft 08/21/23 Goal status: in progress  PLAN:  PT FREQUENCY: 2x/week  PT DURATION: 4 weeks  PLANNED INTERVENTIONS: 97164- PT Re-evaluation,  97110-Therapeutic exercises, 97530- Therapeutic activity, 97112- Neuromuscular re-education, 97535- Self Care, 16109- Manual therapy, [phone]- Gait training, [phone]- Orthotic Fit/training, [phone]- Canalith repositioning, [id number]- Aquatic Therapy, [phone]- Splinting, Patient/Family education, Balance training, Stair training, Taping, Dry Needling, Joint mobilization, Joint manipulation, Spinal manipulation, Spinal mobilization, Scar mobilization, and DME instructions. Marland Kitchen  PLAN FOR NEXT SESSION: postural and lower extremity strengthening; spinal extension; continue 2 x a week for 4 weeks  Becky Sax, LPTA/CLT; CBIS [phone]  3:11 PM, 09/13/23

## 2023-09-17 ENCOUNTER — Institutional Professional Consult (permissible substitution): Admitting: Neurology

## 2023-09-17 ENCOUNTER — Ambulatory Visit (HOSPITAL_COMMUNITY)

## 2023-09-17 DIAGNOSIS — R2689 Other abnormalities of gait and mobility: Secondary | ICD-10-CM

## 2023-09-17 DIAGNOSIS — M6281 Muscle weakness (generalized): Secondary | ICD-10-CM

## 2023-09-17 DIAGNOSIS — G35 Multiple sclerosis: Secondary | ICD-10-CM

## 2023-09-17 DIAGNOSIS — R262 Difficulty in walking, not elsewhere classified: Secondary | ICD-10-CM

## 2023-09-17 DIAGNOSIS — M545 Low back pain, unspecified: Secondary | ICD-10-CM

## 2023-09-17 NOTE — Therapy (Signed)
 OUTPATIENT PHYSICAL THERAPY THORACOLUMBAR TREATMENT/PROGRESS NOTE Progress Note Reporting Period 07/17/2023 to 09/17/23  See note below for Objective Data and Assessment of Progress/Goals.   PHYSICAL THERAPY DISCHARGE SUMMARY  Visits from Start of Care: 7  Current functional level related to goals / functional outcomes: See  below   Remaining deficits: See below   Education / Equipment: HEP   Patient agrees to discharge. Patient goals were partially met. Patient is being discharged due to being pleased with the current functional level.       Patient Name: Melinda Moore MRN: [id number] DOB:1970-10-27, 53 y.o., female Today's Date: 09/17/2023  END OF SESSION:  PT End of Session - 09/17/23 1137     Visit Number 7    Number of Visits 12    Date for PT Re-Evaluation 09/18/23    Authorization Type [employer]    PT Start Time 1137    PT Stop Time 1220    PT Time Calculation (min) 43 min    Activity Tolerance Patient tolerated treatment well    Behavior During Therapy WFL for tasks assessed/performed             Past Medical History:  Diagnosis Date   Anemia    Anxiety    Bell's palsy    Depression    DVT (deep venous thrombosis) (HCC) 09/02/2012   [place] on 02/13/2012 showed acute DVT in left calf veins and posterior tibial veins   Fibromyalgia    GERD (gastroesophageal reflux disease)    History of blood transfusion 2000   History of trigeminal neuralgia    HTN (hypertension)    patient denies was taking a blood pressure medication in early 2000 but it was migraines   Leukopenia    MS (multiple sclerosis) (HCC) 1997   Neuropathic pain    Peripheral edema    Port catheter in place 12/31/2012   Right bundle branch block    Past Surgical History:  Procedure Laterality Date   ABDOMINAL HYSTERECTOMY     BIOPSY  05/08/2023   Procedure: BIOPSY;  Surgeon: Corbin Ade, MD;  [facility]: [facility];  Service: Endoscopy;;   CHOLECYSTECTOMY     COLONOSCOPY WITH PROPOFOL N/A  09/27/2021   Procedure: COLONOSCOPY WITH PROPOFOL;  Surgeon: Corbin Ade, MD;  Location: [facility];  Service: Endoscopy;  Laterality: N/A;  8:00am, asa 2   ESOPHAGOGASTRODUODENOSCOPY  2011   Dr. Jena Gauss. 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 Biopsy from the stomach revealed minimal chronic inactive inflammation but negative for H. pylori   ESOPHAGOGASTRODUODENOSCOPY (EGD) WITH PROPOFOL N/A 05/08/2023   Procedure: ESOPHAGOGASTRODUODENOSCOPY (EGD) WITH PROPOFOL;  Surgeon: Corbin Ade, MD;  [facility]: [facility];  Service: Endoscopy;  Laterality: N/A;  2:30 pm, asa 3   FLEXIBLE SIGMOIDOSCOPY N/A 02/08/2022   Procedure: FLEXIBLE SIGMOIDOSCOPY;  Surgeon: Corbin Ade, MD;  [facility]: [facility];  Service: Endoscopy;  Laterality: N/A;   LAPAROSCOPIC GASTRIC SLEEVE RESECTION N/A 04/17/2016   Procedure: LAPAROSCOPIC GASTRIC SLEEVE RESECTION WITH UPPER ENDOSCOPY;  Surgeon: Glenna Fellows, MD;  Location: [facility];  Service: General;  Laterality: N/A;   PATELLA-FEMORAL ARTHROPLASTY Right 07/14/2019   Procedure: PATELLA-FEMORAL ARTHROPLASTY;  Surgeon: Teryl Lucy, MD;  Location: [facility];  Service: Orthopedics;  Laterality: Right;   PORTACATH PLACEMENT     TUBAL LIGATION  2001   Patient Active Problem List   Diagnosis Date Noted   UTI (urinary tract infection),  bacterial 03/21/2023   Urinary frequency 02/18/2023   Chronic low back pain (1ry area of Pain) (Bilateral) (R>L) w/o sciatica 07/18/2022   Lumbar facet arthropathy (Bilateral) 07/18/2022   Lumbar facet joint pain 07/18/2022   Chronic lower extremity pain (2ry area of Pain) (Right) 07/18/2022   DDD (degenerative disc disease), lumbosacral 07/18/2022   Abnormal MRI, lumbar spine (10/11/2017) 07/18/2022   Lumbosacral foraminal stenosis (Left: L5-S1) 07/18/2022   Obesity, Class II, BMI 35-39.9 07/18/2022   Osteoarthritis of facet joint  of lumbar spine 07/18/2022   Primary osteoarthritis of lumbar spine 07/18/2022   History of total knee replacement (Right) 07/18/2022   Chronic knee pain s/p TKR (Right) 07/18/2022   Chronic medial knee pain (Right) 07/18/2022   Gastroesophageal reflux disease 09/07/2021   Hx of hysterectomy 06/14/2021   Patellofemoral arthritis of knee (Right) 07/14/2019   History of DVT in adulthood 06/23/2019   Radiculopathy due to lumbar intervertebral disc disorder 09/21/2016   Spondylosis without myelopathy or radiculopathy, lumbar region 09/21/2016   Primary osteoarthritis of knee (Right) 08/30/2016   Primary insomnia 08/30/2016   Myofascial pain 08/30/2016   Chronic anxiety 07/08/2013   Chronic pain syndrome 03/11/2013   Port-A-Cath in place 12/31/2012   Constipation 10/01/2012   MS (multiple sclerosis) (HCC) 09/02/2012   Other dysphagia 04/12/2010    PCP: Babs Sciara, MD  REFERRING PROVIDER: Babs Sciara, MD  REFERRING DIAG: M54.9 (ICD-10-CM) - Back pain, unspecified back location, unspecified back pain laterality, unspecified chronicity  Rationale for Evaluation and Treatment: Rehabilitation  THERAPY DIAG:  Low back pain, unspecified back pain laterality, unspecified chronicity, unspecified whether sciatica present  Difficulty in walking, not elsewhere classified  Other abnormalities of gait and mobility  Muscle weakness (generalized)  Multiple sclerosis (HCC)  ONSET DATE: chronic  SUBJECTIVE:                                                                                                                                                                                           SUBJECTIVE STATEMENT: 09/17/23 "my back is so stiff still; and having some right knee pain.  Maybe 25% better"   EVAL:Patient well known to this clinic.  "Same back stiffness; right leg pain"; has not yet been able to start going to the gym or anything yet.  She had a kidney infection at the end of  year and had to lay around a lot so she got stiff all over again.    PERTINENT HISTORY:  MS OA right knee  PAIN:  Are you having pain? Yes: NPRS scale: 8/10 Pain location: back and all the way down the front and  lateral part of right leg Pain description: back aching and stiff; leg is numb and tingling Aggravating factors: walking and standing Relieving factors: pain medication , stretching, elevating legs  PRECAUTIONS: None  RED FLAGS: None   WEIGHT BEARING RESTRICTIONS: No  FALLS:  Has patient fallen in last 6 months? No  OCCUPATION: not working  PLOF: Independent  PATIENT GOALS: stand up straight and walk without being bent over; get off pain medication  NEXT MD VISIT: 08/06/23  OBJECTIVE:  Note: Objective measures were completed at Evaluation unless otherwise noted.  DIAGNOSTIC FINDINGS:  None recently  PATIENT SURVEYS:  Modified Oswestry 18/50  36%   COGNITION: Overall cognitive status: Within functional limits for tasks assessed     SENSATION: Burning and tingling right leg down to lateral ankle   POSTURE: rounded shoulders, forward head, and flexed trunk   PALPATION: General soreness lumbar spine  LUMBAR ROM:   AROM eval  Flexion Fingertips to ankle  Extension Unable to achieve full upright standing posture  Right lateral flexion To mid calf but is flexing at trunk  Left lateral flexion To mid calf but is flexin at trunk  Right rotation   Left rotation    (Blank rows = not tested)  LOWER EXTREMITY ROM:     Active  Right eval Left eval  Hip flexion    Hip extension    Hip abduction    Hip adduction    Hip internal rotation    Hip external rotation    Knee flexion    Knee extension    Ankle dorsiflexion    Ankle plantarflexion    Ankle inversion    Ankle eversion     (Blank rows = not tested)  LOWER EXTREMITY MMT:    MMT Right eval Left eval Right 08/21/23 Left 08/21/23  Hip flexion 4 4+ 4 4+  Hip extension 3- 3 tends to  rotate 3- 3-  Hip abduction      Hip adduction      Hip internal rotation      Hip external rotation      Knee flexion 4- 4 4- 4-  Knee extension 4+ 5 5 5   Ankle dorsiflexion 5 5 5 5   Ankle plantarflexion      Ankle inversion      Ankle eversion       (Blank rows = not tested)  FUNCTIONAL TESTS:  5 times sit to stand: 19.61 sec 2 minute walk test: 217 ft (stopped at 1 min and 20 sec due to pain and fatigue)  GAIT: Distance walked: 217 ft Assistive device utilized: None Level of assistance: Modified independence and SBA Comments: forward flexed trunk; decreased heel strike, increased lateral trunk sway  TREATMENT DATE:  09/17/23 Progress note 5 times sit to stand 13.25 sec 2 MWT 320 ft oneshort standing rest break Modified Oswestry 12/50; 24% Standing lumbar extensions x 10 Nustep seat 10 level 4 x 5'    09/13/23:  Standing:  Lumbar extension front of // bars 8 reps 10" holds Squat front of mat 10x Wall push ups 10x  Quadruped: Cat/camel 5x 10" Child's pose 3x 30" UE flexion 5x 3" LE extension 5x 3" holds  Prone POE x 2 min Pressup 3x 10" Quad stretch with rope 3x 30"  Seated: Sit to stand 10x no HHA to standing extension   08/23/23: Nustep seat 10 level 2 x 5' dynamic warm up POE x Prone quad stretch 3x 30" Prone glut set 10x 3" with  partial lift, verbal and tactile cueing to reduce rotation Supine: Bridge 10x Hip flexor stretch on 2nd step 3x 30" Wall arch 10x each    08/21/23 Nustep seat 10 level 2 x 5' dynamic warm up Progress note Modified Oswestry 17/50 34% 5 times sit to stand 14.14 sec using hands on thighs 2 MWT 222 ft (stopped with 48 sec left due to pain) Standing lumbar extension x 10 in // bars MMT's see above Prone Manual quad stretching 5 x 20" each   08/06/23 Standing: Lumbar extension over // bar x 10 Heel raises 2 x 10 Slant board 5 x 20" Hip abduction 2  x 10 Hip extension 2 x 10 Modified warrior 2 in // bars; flexing  and extending front foot.   Seated: Hamstring stretch with strap 10 x 10" Nustep seat 10 level 2 x 5' conditioning  Sit to stand with round tidal tank 2 x 10 Deadlift with tidal tank with tube x 10  08/02/23 Nustep seat 10 x 5' dynamic warm up Review of HEP and goals Standing: lumbar extension over bar (// bars) Heel raises 2 x 10 Slant board 5 x 20" Hip abduction 2 x 10 Hip extension 2 x 10 At wall; forearms on wall with hips extending to wall x 10 GTB scapular rows 2 x 10 GTB shoulder extension 2 x 10 Supine: Modified thomas stretch with towel 5 x 20" each Bridge 2 x 10   07/17/23 physical therapy evaluation and HEP instruction     Nustep seat 10 x 5'                                                                                                                             PATIENT EDUCATION:  Education details: Patient educated on exam findings, POC, scope of PT, HEP, and discussion of aquatic exercises; [employer]. Person educated: Patient Education method: Explanation, Demonstration, and Handouts Education comprehension: verbalized understanding, returned demonstration, verbal cues required, and tactile cues required  HOME EXERCISE PROGRAM: Access Code: [email or web address] URL: [email or web address] Date: 07/17/2023 Prepared by: [facility]  Exercises - Static Prone on Elbows  - 1 x daily - 7 x weekly - 1 sets - 1 reps - 1 min hold - Prone Press Up  - 1 x daily - 7 x weekly - 1 sets - 10 reps - Standing Lumbar Extension with Counter  - 1 x daily - 7 x weekly - 1 sets - 10 reps - Standing Lumbar Extension at Wall - Forearms  - 1 x daily - 7 x weekly - 1 sets - 10 reps  ASSESSMENT:  CLINICAL IMPRESSION: Progress note today; patient met her 2 MWT goal today with significant improvement in her walking distance.  Good improvement with Modified Oswestry noted but did not quite meet her goals. Patient has met 5/7 set rehab goals.   Patient is agreeable to discharge at this  time and plans to continue with exercise at the [facility].  Eval:Patient is a 53 y.o. female who was seen today for physical therapy evaluation and treatment for M54.9 (ICD-10-CM) - Back pain, unspecified back location, unspecified back pain laterality, unspecified chronicity. Patient demonstrates muscle weakness, reduced ROM, and fascial restrictions which are likely contributing to symptoms of pain and are negatively impacting patient ability to perform ADLs and functional mobility tasks. Patient will benefit from skilled physical therapy services to address these deficits to reduce pain and improve level of function with ADLs and functional mobility tasks.   OBJECTIVE IMPAIRMENTS: Abnormal gait, decreased activity tolerance, decreased endurance, decreased mobility, difficulty walking, decreased ROM, decreased strength, hypomobility, increased fascial restrictions, impaired perceived functional ability, impaired flexibility, postural dysfunction, and pain.   ACTIVITY LIMITATIONS: carrying, lifting, bending, sitting, standing, squatting, stairs, transfers, bed mobility, and locomotion level  PARTICIPATION LIMITATIONS: meal prep, cleaning, laundry, shopping, and community activity  PERSONAL FACTORS: Past/current experiences and 1 comorbidity: MS  are also affecting patient's functional outcome.   REHAB POTENTIAL: Good  CLINICAL DECISION MAKING: Evolving/moderate complexity  EVALUATION COMPLEXITY: Moderate   GOALS: Goals reviewed with patient? No  SHORT TERM GOALS: Target date: 07/31/2023  patient will be independent with initial HEP  Baseline: Goal status: met  2.  Patient will report 30% improvement overall  Baseline:  Goal status:met  LONG TERM GOALS: Target date: 08/14/22  Patient will be independent in self management strategies to improve quality of life and functional outcomes.  Baseline:  Goal status: met  2.  Patient will report 50% improvement overall  Baseline:  Goal  status: in progress  3.  Patient will improve Modified Oswestry score by 8 points to demonstrate improved perceived function   Baseline: 18/50; 17/50 08/21/23; 12/50; 24% 09/17/23 Goal status: In progress  4.  Patient will improve 5 times sit to stand score to 15 sec or less to demonstrate improved functional mobility and increased leg strength.     Baseline: 19.61 sec; 14.41 sec; 09/17/23 13.25 sec Goal status: met  5.  Patient will increase distance on to 250 ft demonstrate improved functional mobility walking household and community distances.   Baseline: 217 ft; 222 ft 08/21/23; 09/17/23 320 ft Goal status:met  PLAN:  PT FREQUENCY: 2x/week  PT DURATION: 4 weeks  PLANNED INTERVENTIONS: 97164- PT Re-evaluation, 97110-Therapeutic exercises, 97530- Therapeutic activity, 97112- Neuromuscular re-education, 97535- Self Care, 09811- Manual therapy, [phone]- Gait training, [phone]- Orthotic Fit/training, [phone]- Canalith repositioning, [id number]- Aquatic Therapy, [phone]- Splinting, Patient/Family education, Balance training, Stair training, Taping, Dry Needling, Joint mobilization, Joint manipulation, Spinal manipulation, Spinal mobilization, Scar mobilization, and DME instructions. Marland Kitchen  PLAN FOR NEXT SESSION: discharge 12:11 PM, 09/17/23 Aveion Nguyen Small Seneca Gadbois MPT  physical therapy [place] [phone]

## 2023-09-19 ENCOUNTER — Encounter (HOSPITAL_COMMUNITY)

## 2023-09-24 ENCOUNTER — Encounter (HOSPITAL_COMMUNITY)

## 2023-09-26 ENCOUNTER — Encounter (HOSPITAL_COMMUNITY)

## 2023-10-01 ENCOUNTER — Encounter (HOSPITAL_COMMUNITY)

## 2023-10-01 DIAGNOSIS — M5416 Radiculopathy, lumbar region: Secondary | ICD-10-CM | POA: Diagnosis not present

## 2023-10-08 ENCOUNTER — Institutional Professional Consult (permissible substitution): Admitting: Neurology

## 2023-10-16 ENCOUNTER — Encounter: Payer: Self-pay | Admitting: Family Medicine

## 2023-10-16 ENCOUNTER — Ambulatory Visit: Payer: 59 | Admitting: Family Medicine

## 2023-10-16 VITALS — BP 118/70 | HR 83 | Temp 97.9°F | Ht 69.0 in | Wt 256.0 lb

## 2023-10-16 DIAGNOSIS — M5431 Sciatica, right side: Secondary | ICD-10-CM

## 2023-10-16 DIAGNOSIS — E785 Hyperlipidemia, unspecified: Secondary | ICD-10-CM | POA: Diagnosis not present

## 2023-10-16 DIAGNOSIS — Z79899 Other long term (current) drug therapy: Secondary | ICD-10-CM

## 2023-10-16 DIAGNOSIS — Z79891 Long term (current) use of opiate analgesic: Secondary | ICD-10-CM

## 2023-10-16 DIAGNOSIS — M5416 Radiculopathy, lumbar region: Secondary | ICD-10-CM | POA: Diagnosis not present

## 2023-10-16 NOTE — Progress Notes (Unsigned)
 Subjective:    Patient ID: Alison Applebaum, female    DOB: 09/24/1970, 53 y.o.   MRN: [id number]  HPI This patient was seen today for chronic pain  The medication list was reviewed and updated.   Location of Pain for which the patient has been treated with regarding narcotics: lower back pain and right leg pain, also MS pain, was under the care of a neurologist who had her on hydromorphone  She is still on this  Onset of this pain: Present for years   -Compliance with medication: Good compliance  - Number patient states they take daily: 4 times a day   -Reason for ongoing use of opioids lower back and right leg pain   What other measures have been tried outside of opioids anti inflammatory, injections, PT, home stretches  In the ongoing specialists regarding this condition MS doctor, back doctor who does injections  -when was the last dose patient took? This morning   The patient was advised the importance of maintaining medication and not using illegal substances with these.  Here for refills and follow up  The patient was educated that we can provide 3 monthly scripts for their medication, it is their responsibility to follow the instructions.  Side effects or complications from medications: Denies side effects  Patient is aware that pain medications are meant to minimize the severity of the pain to allow their pain levels to improve to allow for better function. They are aware of that pain medications cannot totally remove their pain.  Due for UDT ( at least once per year) (pain management contract is also completed at the time of the UDT): 02/08/2023  Scale of 1 to 10 ( 1 is least 10 is most) Your pain level without the medicine: 10 Your pain level with medication 5-6 1/2  Scale 1 to 10 ( 1-helps very little, 10 helps very well) How well does your pain medication reduce your pain so you can function better through out the day?  8  Quality of the pain: Severe aching pain  discomfort  Persistence of the pain: Present all the time  Modifying factors: Worse with activity         Review of Systems     Objective:   Physical Exam General-in no acute distress Eyes-no discharge Lungs-respiratory rate normal, CTA CV-no murmurs,RRR Extremities skin warm dry no edema Neuro grossly normal Behavior normal, alert Active low back pain and right leg pain   Significant time was spent today discussing pain medication We are certainly sympathetic to help keep her pain under control But trying to reduce the amount of pain medicine she is on to give her greater safety in the long run She will make a gradual attempt to go from 4 tablets down to 3-1/2 tablets over the next 30 days then we will go from there If this is not successful we will move toward pain management within the next 30 does 90 days  I did speak with her pharmacy they will allow her to get her prescription early but the next prescription will fall due to flea at the time that it is indicated    Assessment & Plan:  1. High risk medication use (Primary) Lab work ordered await results - Basic metabolic panel with GFR  2. Hyperlipidemia, unspecified hyperlipidemia type Healthy diet check lab work - Lipid panel  3. Encounter for long-term opiate analgesic use The patient was seen in followup for chronic pain. A review over at  their current pain status was discussed. Drug registry was checked. Prescriptions were given.  Regular follow-up recommended. Discussion was held regarding the importance of compliance with medication as well as pain medication contract.  Patient was informed that medication may cause drowsiness and should not be combined  with other medications/alcohol  or street drugs. If the patient feels medication is causing altered alertness then do not drive or operate dangerous equipment.  Should be noted that the patient appears to be meeting appropriate use of opioids and  response.  Evidenced by improved function and decent pain control without significant side effects and no evidence of overt aberrancy issues.  Upon discussion with the patient today they understand that opioid therapy is optional and they feel that the pain has been refractory to reasonable conservative measures and is significant and affecting quality of life enough to warrant ongoing therapy and wishes to continue opioids.  Refills were provided.  [place]  medical Board guidelines regarding the pain medicine has been reviewed.  CDC guidelines most updated 2022 has been reviewed by the prescriber.  PDMP is checked on a regular basis yearly urine drug screen and pain management contract  Treatment plan for this patient includes #1-gentle stretching exercises as shown daily basis 2.  Mild strength exercises 3 times per week #3 continue pain medications #4 notify us  if any digression  We have discussed how hydromorphone  needs to be tapered down and if unable to taper down then she will need referral to pain management She will talk [facility] on whether or not they do medications She will also talk with them about whether or not they feel that surgical intervention would be helpful She will give us  feedback regarding this She will try to cut down to 3-1/2 tablets/day over the course of the next month and then in 1 month she will need to touch base with us  and if she has been able to taper down we will move forward with tapering down toward 3 tablets/day/month and continue to stepwise bring it down as best as possible  Patient was told that if she is not making progress we will set her up with pain management she understands this and is in agreements She states her current medication does not cause drowsiness  4. Sciatica, right side Please see discussion above

## 2023-10-17 ENCOUNTER — Other Ambulatory Visit: Payer: Self-pay | Admitting: Family Medicine

## 2023-10-17 ENCOUNTER — Encounter: Payer: Self-pay | Admitting: Family Medicine

## 2023-10-17 MED ORDER — HYDROMORPHONE HCL 4 MG PO TABS: ORAL_TABLET | ORAL | 0 refills | Status: DC

## 2023-10-17 MED ORDER — HYDROMORPHONE HCL 4 MG PO TABS
ORAL_TABLET | ORAL | 0 refills | Status: DC
Start: 2023-10-17 — End: 2024-02-04

## 2023-10-30 ENCOUNTER — Other Ambulatory Visit: Payer: Self-pay | Admitting: Family Medicine

## 2023-10-30 DIAGNOSIS — F419 Anxiety disorder, unspecified: Secondary | ICD-10-CM

## 2023-11-08 ENCOUNTER — Encounter (HOSPITAL_COMMUNITY): Payer: Self-pay | Admitting: *Deleted

## 2023-11-14 ENCOUNTER — Encounter: Payer: Self-pay | Admitting: Family Medicine

## 2023-11-15 ENCOUNTER — Ambulatory Visit: Admitting: Nurse Practitioner

## 2023-11-15 VITALS — BP 102/67 | HR 85 | Temp 85.0°F | Ht 69.0 in | Wt 259.8 lb

## 2023-11-15 DIAGNOSIS — A499 Bacterial infection, unspecified: Secondary | ICD-10-CM | POA: Diagnosis not present

## 2023-11-15 DIAGNOSIS — R300 Dysuria: Secondary | ICD-10-CM | POA: Diagnosis not present

## 2023-11-15 DIAGNOSIS — N390 Urinary tract infection, site not specified: Secondary | ICD-10-CM | POA: Diagnosis not present

## 2023-11-15 LAB — POCT URINALYSIS DIP (CLINITEK)
Bilirubin, UA: NEGATIVE
Blood, UA: NEGATIVE
Glucose, UA: NEGATIVE mg/dL
Ketones, POC UA: NEGATIVE mg/dL
Leukocytes, UA: NEGATIVE
Nitrite, UA: NEGATIVE
POC PROTEIN,UA: NEGATIVE
Spec Grav, UA: 1.01 (ref 1.010–1.025)
Urobilinogen, UA: 0.2 U/dL — NL
pH, UA: 7.5 (ref 5.0–8.0)

## 2023-11-15 MED ORDER — CEPHALEXIN 500 MG PO CAPS
500.0000 mg | ORAL_CAPSULE | Freq: Three times a day (TID) | ORAL | 0 refills | Status: AC
Start: 2023-11-15 — End: ?

## 2023-11-15 MED ORDER — FLUCONAZOLE 150 MG PO TABS
ORAL_TABLET | ORAL | 0 refills | Status: DC
Start: 2023-11-15 — End: 2024-04-14

## 2023-11-16 ENCOUNTER — Encounter: Payer: Self-pay | Admitting: Nurse Practitioner

## 2023-11-16 NOTE — Progress Notes (Signed)
   Subjective:    Patient ID: Melinda Moore, female    DOB: 1970-10-05, 54 y.o.   MRN: [id number]  HPI Presents for complaints of urinary symptoms that began 3 days ago.  Suprapubic pressure with frequent urination.  Slight burning and odor.  States she feels the urge to go to the bathroom to urinate but sometimes no voiding.  No fever.  No flank pain.  Last UTI was in October 2024.  Same female sexual partner.  No vaginal discharge.  Taking fluids well.   Review of Systems  Constitutional:  Negative for fever.  Respiratory:  Negative for cough, chest tightness and shortness of breath.   Cardiovascular:  Negative for chest pain.  Genitourinary:  Positive for difficulty urinating, dysuria, frequency and urgency. Negative for enuresis, flank pain, pelvic pain and vaginal discharge.       Objective:   Physical Exam NAD.  Alert, oriented.  Lungs clear.  Heart regular rate rhythm.  No CVA tenderness to percussion.  Abdomen soft nondistended nontender. Today's Vitals   11/15/23 1459  BP: 102/67  Pulse: 85  Temp: (!) 85 F (29.4 C)  SpO2: 98%  Weight: 259 lb 12.8 oz (117.8 kg)  Height: 5\' 9"  (1.753 m)   Body mass index is 38.37 kg/m.  Results for orders placed or performed in visit on 11/15/23  POCT URINALYSIS DIP (CLINITEK)   Collection Time: 11/15/23  3:16 PM  Result Value Ref Range   Color, UA yellow yellow   Clarity, UA clear clear   Glucose, UA negative negative mg/dL   Bilirubin, UA negative negative   Ketones, POC UA negative negative mg/dL   Spec Grav, UA 4.098 1.191 - 1.025   Blood, UA negative negative   pH, UA 7.5 5.0 - 8.0   POC PROTEIN,UA negative negative, trace   Urobilinogen, UA 0.2 0.2 or 1.0 E.U./dL   Nitrite, UA Negative Negative   Leukocytes, UA Negative Negative   *Note: Due to a large number of results and/or encounters for the requested time period, some results have not been displayed. A complete set of results can be found in Results Review.   Urine  microscopic: 0-2 WBCs with several epithelial cells.       Assessment & Plan:   Problem List Items Addressed This Visit       Genitourinary   UTI (urinary tract infection), bacterial   Relevant Medications   cephALEXin  (KEFLEX ) 500 MG capsule   fluconazole  (DIFLUCAN ) 150 MG tablet   Other Visit Diagnoses       Dysuria    -  Primary   Relevant Orders   POCT URINALYSIS DIP (CLINITEK) (Completed)      Start Keflex  as directed. Diflucan  ordered per patient request. Defers STD testing. Warning signs reviewed.  Call back if symptoms worsen or persist.

## 2023-11-22 ENCOUNTER — Encounter: Payer: Self-pay | Admitting: Family Medicine

## 2023-11-26 ENCOUNTER — Encounter: Payer: Self-pay | Admitting: Nurse Practitioner

## 2023-11-26 ENCOUNTER — Other Ambulatory Visit: Payer: Self-pay

## 2023-11-26 MED ORDER — CEPHALEXIN 500 MG PO CAPS: 500.0000 mg | ORAL_CAPSULE | Freq: Three times a day (TID) | ORAL | 0 refills | Status: DC

## 2023-12-02 ENCOUNTER — Encounter: Payer: Self-pay | Admitting: Family Medicine

## 2023-12-03 ENCOUNTER — Other Ambulatory Visit: Payer: Self-pay | Admitting: Family Medicine

## 2023-12-03 MED ORDER — HYDROMORPHONE HCL 4 MG PO TABS: ORAL_TABLET | ORAL | 0 refills | Status: DC

## 2023-12-19 ENCOUNTER — Other Ambulatory Visit: Payer: Self-pay | Admitting: Family Medicine

## 2023-12-19 ENCOUNTER — Encounter: Payer: Self-pay | Admitting: Family Medicine

## 2023-12-19 MED ORDER — HYDROMORPHONE HCL 4 MG PO TABS: ORAL_TABLET | ORAL | 0 refills | Status: DC

## 2023-12-19 NOTE — Telephone Encounter (Signed)
 Nurses I sent in an additional prescription of pain medicine that she can get filled on 23 July Go ahead with referral to [facility] for pain management Please also go ahead and schedule her in early August with me-the patient can always cancel if [facility] sees her by then The prescription I sent in will cover her for 30 days so therefore I would like to see her in early to mid August unless she is already seeing pain management thank you

## 2023-12-19 NOTE — Telephone Encounter (Signed)
 Patient stated she would like referral to pain management -does not feel like she will be able to taper down - would like a script for the tapered amount of 110 to get her through to her appt with pain management

## 2023-12-19 NOTE — Telephone Encounter (Signed)
 Copied from CRM [id number]. Topic: Clinical - Medication Question >> Dec 19, 2023 10:56 AM Cleave MATSU wrote: Reason for CRM: pt wants to know if Dr.luking can send her medication in hydromorphone  4mg  she usually see him every 3 months and her medication will run out on the 24th she doesn't have an appt until August can you see if Dr. Alphonsa can send this in for her.

## 2023-12-20 ENCOUNTER — Encounter: Payer: Self-pay | Admitting: Family Medicine

## 2023-12-20 DIAGNOSIS — M5431 Sciatica, right side: Secondary | ICD-10-CM

## 2023-12-20 DIAGNOSIS — G35 Multiple sclerosis: Secondary | ICD-10-CM

## 2023-12-20 DIAGNOSIS — G894 Chronic pain syndrome: Secondary | ICD-10-CM

## 2023-12-20 DIAGNOSIS — M549 Dorsalgia, unspecified: Secondary | ICD-10-CM

## 2023-12-20 DIAGNOSIS — Z79891 Long term (current) use of opiate analgesic: Secondary | ICD-10-CM

## 2023-12-21 ENCOUNTER — Telehealth: Payer: Self-pay | Admitting: Family Medicine

## 2023-12-21 ENCOUNTER — Other Ambulatory Visit: Payer: Self-pay | Admitting: Family Medicine

## 2023-12-21 MED ORDER — HYDROMORPHONE HCL 4 MG PO TABS: ORAL_TABLET | ORAL | 0 refills | Status: DC

## 2023-12-21 NOTE — Telephone Encounter (Signed)
 Please go ahead with referral to pain management I would recommend [facility] her quantity exceeds our guidelines Patient is aware of the referral please go ahead with it

## 2023-12-23 NOTE — Telephone Encounter (Signed)
 Duplicate- see other my chart message- referral has been ordered in [facility] and medication sent by provider- patient has follow up appointment scheduled in August

## 2023-12-23 NOTE — Telephone Encounter (Signed)
 Duplicate- referral ordered in EPIC- See my chart message

## 2023-12-24 DIAGNOSIS — M5416 Radiculopathy, lumbar region: Secondary | ICD-10-CM | POA: Diagnosis not present

## 2024-01-08 DIAGNOSIS — M5416 Radiculopathy, lumbar region: Secondary | ICD-10-CM | POA: Diagnosis not present

## 2024-01-14 ENCOUNTER — Other Ambulatory Visit: Payer: Self-pay | Admitting: Family Medicine

## 2024-01-17 ENCOUNTER — Ambulatory Visit: Admitting: Family Medicine

## 2024-01-20 ENCOUNTER — Ambulatory Visit: Admitting: Family Medicine

## 2024-01-20 ENCOUNTER — Encounter: Payer: Self-pay | Admitting: Family Medicine

## 2024-01-20 VITALS — BP 118/60 | HR 102 | Temp 97.2°F | Ht 69.0 in | Wt 266.0 lb

## 2024-01-20 DIAGNOSIS — M5431 Sciatica, right side: Secondary | ICD-10-CM | POA: Diagnosis not present

## 2024-01-20 DIAGNOSIS — M549 Dorsalgia, unspecified: Secondary | ICD-10-CM | POA: Diagnosis not present

## 2024-01-20 NOTE — Progress Notes (Signed)
 Subjective:    Patient ID: Melinda Moore, female    DOB: [birth year], 53 y.o.   MRN: [id number]  HPI 3 month follow up and pain management  This patient was seen today for chronic pain  The medication list was reviewed and updated.   Location of Pain for which the patient has been treated with regarding narcotics: Back pain, sciatica pain, MS related pain, degenerative arthritis of the spine  Onset of this pain: Present for years   -Compliance with medication: Good compliance  - Number patient states they take daily: 4 tablets of the hydromorphone  4 mg  -Reason for ongoing use of opioids cannot get adequate relief with gabapentin  and Tylenol  NSAIDs  What other measures have been tried outside of opioids NSAIDs, Tylenol , gabapentin   In the ongoing specialists regarding this condition MS specialist  -when was the last dose patient took?  Past 24 hours  The patient was advised the importance of maintaining medication and not using illegal substances with these.  Here for refills and follow up  The patient was educated that we can provide 3 monthly scripts for their medication, it is their responsibility to follow the instructions.  Side effects or complications from medications: None  Patient is aware that pain medications are meant to minimize the severity of the pain to allow their pain levels to improve to allow for better function. They are aware of that pain medications cannot totally remove their pain.  Due for UDT ( at least once per year) (pain management contract is also completed at the time of the UDT): Due in September  Scale of 1 to 10 ( 1 is least 10 is most) Your pain level without the medicine: 10 Your pain level with medication 6  Scale 1 to 10 ( 1-helps very little, 10 helps very well) How well does your pain medication reduce your pain so you can function better through out the day?  8  Quality of the pain: Burning aching throbbing  Persistence of the  pain: Present all time  Modifying factors: Worse with activity  Discussed the use of AI scribe software for clinical note transcription with the patient, who gave verbal consent to proceed.  History of Present Illness   Melinda Moore is a 53 year old female who presents with persistent muscle cramps in the right leg.  She experiences persistent muscle cramps in her right leg, describing them as a constant sensation similar to a cramp in the foot with a burning quality. Gabapentin  provides some relief, but muscle relaxers have been too strong, causing unwanted side effects.  She has noticed a change in bowel habits, transitioning from constipation to diarrhea. She is currently taking Imodium to manage the diarrhea.  She takes torsemide  once daily in the morning for fluid management. Previously, she was taking it three times a day when experiencing significant fluid retention, but has since reduced the dosage as her condition improved.  Her relationship with her partner, Melinda Moore (also referred to as Melinda Moore), is going well. She has recently celebrated her anniversary and birthdays with trips out of town.  Her moods are stable and doing well.          Review of Systems     Objective:   Physical Exam  General-in no acute distress Eyes-no discharge Lungs-respiratory rate normal, CTA CV-no murmurs,RRR Extremities skin warm dry no edema Neuro grossly normal Behavior normal, alert  She describes sciatica down the right leg into the calf area  constant burning pain gabapentin  helps with it pain medicine helps     Assessment & Plan:  Chronic pain syndrome-has been dealing with back pain and sciatica for years.  In addition to this has pain in her joints and muscles related to MS.  Previously tried hydrocodone  and oxycodone  but oxycodone  caused nausea and would not relieve the pain.  Has been on hydromorphone  over the past several years at the current dose of 4 mg 4 times daily.  The patient  has been faithful with her visits every 3 months.  She does not ask for her medicines early.  Her urine drug screens in the past have been negative.  Patient main reason to go to pain management is with the new guidelines of the office MME needs to be 50 or less.  Patient was unable to taper down because of increased pain when she tried to taper down.  Chronic pain managed with hydromorphone  4 mg QID, equivalent to 80 mg morphine  daily. - Transition pain management to specialist at [facility] for monthly visits and urine drug screens. - Send prescription for hydromorphone  to cover until January 31, 2024. - Send letter and recent office visit notes to pain management specialist. - Cease prescriptions if specialist assumes management. The patient was seen in followup for chronic pain. A review over at their current pain status was discussed. Drug registry was checked. Prescriptions were given.  Regular follow-up recommended. Discussion was held regarding the importance of compliance with medication as well as pain medication contract.  Patient was informed that medication may cause drowsiness and should not be combined  with other medications/alcohol  or street drugs. If the patient feels medication is causing altered alertness then do not drive or operate dangerous equipment.  Should be noted that the patient appears to be meeting appropriate use of opioids and response.  Evidenced by improved function and decent pain control without significant side effects and no evidence of overt aberrancy issues.  Upon discussion with the patient today they understand that opioid therapy is optional and they feel that the pain has been refractory to reasonable conservative measures and is significant and affecting quality of life enough to warrant ongoing therapy and wishes to continue opioids.  Refills were provided.  [place]  medical Board guidelines regarding the pain medicine has been reviewed.  CDC  guidelines most updated 2022 has been reviewed by the prescriber.  PDMP is checked on a regular basis yearly urine drug screen and pain management contract  Treatment plan for this patient includes #1-gentle stretching exercises as shown daily basis 2.  Mild strength exercises 3 times per week #3 continue pain medications #4 notify us  if any digression  We will send in 1 prescription today this follow-up prescription will depend on what [facility] does.  They are supposed to evaluate her and take over her pain management. Right leg neuropathic pain Likely neuropathic pain, possibly from pinched nerve. Gabapentin  provides partial relief. Muscle relaxers not recommended due to opioid interactions.  Edema Managed with torsemide  once daily.  Obesity Obesity management hindered by high cost of medications like Zepbound, not covered by insurance. - Write down Zypram for her to check insurance coverage.

## 2024-01-23 ENCOUNTER — Telehealth: Payer: Self-pay | Admitting: Family Medicine

## 2024-01-23 ENCOUNTER — Encounter: Payer: Self-pay | Admitting: Family Medicine

## 2024-01-23 NOTE — Telephone Encounter (Signed)
 Front A letter was dictated regarding this patient.  Please print the letter Also print her most recent office visit Send this to [facility] attention Heron Fear This is [facility] on [address]. she has an appointment at the end of this month so therefore we need to send out this letter and recent office visit note currently thank you

## 2024-01-26 ENCOUNTER — Encounter: Payer: Self-pay | Admitting: Family Medicine

## 2024-01-27 ENCOUNTER — Other Ambulatory Visit: Payer: Self-pay

## 2024-01-27 ENCOUNTER — Encounter: Payer: Self-pay | Admitting: Family Medicine

## 2024-01-27 DIAGNOSIS — F419 Anxiety disorder, unspecified: Secondary | ICD-10-CM

## 2024-01-27 DIAGNOSIS — G8929 Other chronic pain: Secondary | ICD-10-CM

## 2024-01-27 DIAGNOSIS — F439 Reaction to severe stress, unspecified: Secondary | ICD-10-CM

## 2024-01-27 NOTE — Telephone Encounter (Signed)
 Nurses Please send a referral to the referral team for Melinda Moore to have an appointment with behavioral counseling with [facility] preferably [place] office. Diagnosis stress, chronic pain, anxiety  You may also forward the following message  Hi Melinda Moore  I agree with you-it is always helpful to do some counseling when faced with a significant amount of stress.  I am hopeful that counseling can improve how you are doing.  We will go ahead with the referral they should reach out to you somewhere within the course of the next couple weeks if you do not hear anything from the referral team within the next couple weeks please let me know Please take care-Dr. Glendia

## 2024-01-28 DIAGNOSIS — G35 Multiple sclerosis: Secondary | ICD-10-CM | POA: Diagnosis not present

## 2024-02-04 ENCOUNTER — Other Ambulatory Visit: Payer: Self-pay | Admitting: Family Medicine

## 2024-02-04 ENCOUNTER — Encounter: Payer: Self-pay | Admitting: Family Medicine

## 2024-02-04 MED ORDER — HYDROMORPHONE HCL 4 MG PO TABS: ORAL_TABLET | ORAL | 0 refills | Status: AC

## 2024-02-14 ENCOUNTER — Other Ambulatory Visit: Payer: Self-pay | Admitting: Family Medicine

## 2024-02-28 DIAGNOSIS — E559 Vitamin D deficiency, unspecified: Secondary | ICD-10-CM | POA: Diagnosis not present

## 2024-02-28 DIAGNOSIS — M129 Arthropathy, unspecified: Secondary | ICD-10-CM | POA: Diagnosis not present

## 2024-02-28 DIAGNOSIS — M549 Dorsalgia, unspecified: Secondary | ICD-10-CM | POA: Diagnosis not present

## 2024-02-28 DIAGNOSIS — E7800 Pure hypercholesterolemia, unspecified: Secondary | ICD-10-CM | POA: Diagnosis not present

## 2024-02-28 DIAGNOSIS — Z79899 Other long term (current) drug therapy: Secondary | ICD-10-CM | POA: Diagnosis not present

## 2024-02-28 DIAGNOSIS — R0602 Shortness of breath: Secondary | ICD-10-CM | POA: Diagnosis not present

## 2024-02-28 DIAGNOSIS — G35 Multiple sclerosis: Secondary | ICD-10-CM | POA: Diagnosis not present

## 2024-02-28 DIAGNOSIS — D539 Nutritional anemia, unspecified: Secondary | ICD-10-CM | POA: Diagnosis not present

## 2024-03-04 ENCOUNTER — Other Ambulatory Visit (HOSPITAL_COMMUNITY): Payer: Self-pay | Admitting: Family Medicine

## 2024-03-04 DIAGNOSIS — Z1231 Encounter for screening mammogram for malignant neoplasm of breast: Secondary | ICD-10-CM

## 2024-03-12 ENCOUNTER — Other Ambulatory Visit: Payer: Self-pay | Admitting: Internal Medicine

## 2024-03-12 NOTE — Telephone Encounter (Signed)
Pt needs ov for further refills.  ?

## 2024-04-03 ENCOUNTER — Ambulatory Visit (HOSPITAL_COMMUNITY)
Admission: RE | Admit: 2024-04-03 | Discharge: 2024-04-03 | Disposition: A | Discharge status: Home / Self Care | Source: Ambulatory Visit | Attending: Family Medicine | Admitting: Family Medicine

## 2024-04-03 DIAGNOSIS — Z1231 Encounter for screening mammogram for malignant neoplasm of breast: Secondary | ICD-10-CM | POA: Diagnosis present

## 2024-04-10 ENCOUNTER — Other Ambulatory Visit: Payer: Self-pay | Admitting: Family Medicine

## 2024-04-14 ENCOUNTER — Ambulatory Visit (INDEPENDENT_AMBULATORY_CARE_PROVIDER_SITE_OTHER): Admitting: Internal Medicine

## 2024-04-14 ENCOUNTER — Encounter: Payer: Self-pay | Admitting: Internal Medicine

## 2024-04-14 VITALS — BP 128/77 | HR 101 | Temp 97.9°F | Ht 69.0 in | Wt 266.8 lb

## 2024-04-14 DIAGNOSIS — R197 Diarrhea, unspecified: Secondary | ICD-10-CM | POA: Diagnosis not present

## 2024-04-14 MED ORDER — LANSOPRAZOLE 30 MG PO CPDR: 30.0000 mg | DELAYED_RELEASE_CAPSULE | Freq: Two times a day (BID) | ORAL | 11 refills | Status: AC

## 2024-04-14 NOTE — Patient Instructions (Signed)
 It was nice to see you again today  Lets do a stool sample for C. difficile testing.  May continue using Imodium as needed  Continue lansoprazole  20 mg twice daily.  Further recommendations to follow once stool results are available.  This visit here in 6 months

## 2024-04-14 NOTE — Progress Notes (Unsigned)
 Gastroenterology Progress Note    Primary Care Physician:  Alphonsa Glendia LABOR, MD Primary Gastroenterologist:  Dr. Shaaron  Pre-Procedure History & Physical: HPI:  Melinda Moore is a 53 y.o. female with MS on chronic opioid therapy returns for follow-up.  History of opioid-induced constipation historically managed well with Symproic .  However she notes over the past 6 months she has had loose bowel movements and has not taken Symproic  in fact, she takes Imodium occasionally.  Has 2-3 semiformed [place] 6 7 stools.  She actually feels this is a relief from chronic constipation.  She has been treated with multiple rounds antibiotics for urinary tract infections earlier in the year.  Notes her reflux symptoms are well-controlled on twice daily lansoprazole .  She has no dysphagia.  Attempted colonoscopy previously thwarted by poor prep.  Cologuard negative in 2024; due for retesting 2027.  She denies any new medications.  Past Medical History:  Diagnosis Date   Anemia    Anxiety    Bell's palsy    Depression    DVT (deep venous thrombosis) (HCC) 09/02/2012   US  on 02/13/2012 showed acute DVT in left calf veins and posterior tibial veins   Fibromyalgia    GERD (gastroesophageal reflux disease)    History of blood transfusion 2000   History of trigeminal neuralgia    HTN (hypertension)    patient denies was taking a blood pressure medication in early 2000 but it was migraines   Leukopenia    MS (multiple sclerosis) 1997   Neuropathic pain    Peripheral edema    Port catheter in place 12/31/2012   Right bundle branch block     Past Surgical History:  Procedure Laterality Date   ABDOMINAL HYSTERECTOMY     BIOPSY  05/08/2023   Procedure: BIOPSY;  Surgeon: Shaaron Lamar HERO, MD;  [facility]: [facility];  Service: Endoscopy;;   CHOLECYSTECTOMY     COLONOSCOPY WITH PROPOFOL  N/A 09/27/2021   Procedure: COLONOSCOPY WITH PROPOFOL ;  Surgeon: Shaaron Lamar HERO, MD;  Location: [facility];  Service:  Endoscopy;  Laterality: N/A;  8:00am, asa 2   ESOPHAGOGASTRODUODENOSCOPY  2011   Dr. Shaaron. 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 Biopsy from the stomach revealed minimal chronic inactive inflammation but negative for H. pylori   ESOPHAGOGASTRODUODENOSCOPY (EGD) WITH PROPOFOL  N/A 05/08/2023   Procedure: ESOPHAGOGASTRODUODENOSCOPY (EGD) WITH PROPOFOL ;  Surgeon: Shaaron Lamar HERO, MD;  Location: [facility];  Service: Endoscopy;  Laterality: N/A;  2:30 pm, asa 3   FLEXIBLE SIGMOIDOSCOPY N/A 02/08/2022   Procedure: FLEXIBLE SIGMOIDOSCOPY;  Surgeon: Shaaron Lamar HERO, MD;  Location: [facility];  Service: Endoscopy;  Laterality: N/A;   LAPAROSCOPIC GASTRIC SLEEVE RESECTION N/A 04/17/2016   Procedure: LAPAROSCOPIC GASTRIC SLEEVE RESECTION WITH UPPER ENDOSCOPY;  Surgeon: Morene Olives, MD;  Location: [facility];  Service: General;  Laterality: N/A;   PATELLA-FEMORAL ARTHROPLASTY Right 07/14/2019   Procedure: PATELLA-FEMORAL ARTHROPLASTY;  Surgeon: Josefina Chew, MD;  Location: [facility];  Service: Orthopedics;  Laterality: Right;   PORTACATH PLACEMENT     TUBAL LIGATION  2001    Prior to Admission medications   Medication Sig Start Date End Date Taking? Authorizing Provider  albuterol  (VENTOLIN  HFA) 108 (90 Base) MCG/ACT inhaler Inhale 2 puffs into the lungs every 4 (four) hours as needed for wheezing or shortness of breath. 06/14/23  Yes Mauro Elveria BROCKS, NP  ascorbic acid  (VITAMIN C) 500 MG tablet  Take 1 tablet (500 mg total) by mouth daily. 02/15/20  Yes Johnson, Clanford L, MD  baclofen  (LIORESAL ) 10 MG tablet Take 10 mg by mouth 2 (two) times daily. 03/09/22  Yes [provider]  Bioflavonoid Products (BIOFLEX) TABS Take 2 tablets by mouth daily.   Yes [provider]  buPROPion  (WELLBUTRIN  SR) 150 MG 12 hr tablet TAKE ONE TABLET BY MOUTH TWICE A DAY 10/30/23  Yes Luking, Glendia LABOR, MD   carbamazepine  (TEGRETOL  XR) 200 MG 12 hr tablet Take 1 tablet (200 mg total) by mouth 3 (three) times daily as needed. 11/07/22  Yes Luking, Glendia LABOR, MD  Cholecalciferol  (VITAMIN D3) 125 MCG (5000 UT) TABS Take 20,000 Units by mouth 2 (two) times daily.   Yes [provider]  gabapentin  (NEURONTIN ) 300 MG capsule Take 1,200 mg by mouth 3 (three) times daily. 02/04/21  Yes [provider]  HYDROmorphone  (DILAUDID ) 4 MG tablet 1 tablet po qid prn, max use per day is 4 tablets. 02/04/24  Yes Alphonsa Glendia LABOR, MD  lansoprazole  (PREVACID ) 30 MG capsule TAKE ONE CAPSULE (30MG  TOTAL) BY MOUTH TWO TIMES DAILY BEFORE A MEAL 03/12/24  Yes Lilyrose Tanney, Lamar HERO, MD  methylphenidate  (RITALIN  LA) 10 MG 24 hr capsule Take 1 capsule (10 mg total) by mouth daily. 08/06/23  Yes Alphonsa Glendia LABOR, MD  Multiple Vitamin (MULTIVITAMIN WITH MINERALS) TABS tablet Take 1 tablet by mouth daily. ALIVE   Yes [provider]  Multiple Vitamins-Minerals (EMERGEN-C IMMUNE) PACK Take 1 packet by mouth once a week.   Yes [provider]  naloxone  (NARCAN ) 4 MG/0.1ML LIQD nasal spray kit SPRAY INTO NOSE AS DIRECTED FOR ACCIDENTAL OVERDOSE 05/19/22  Yes Luking, Glendia LABOR, MD  Ofatumumab (KESIMPTA) 20 MG/0.4ML SOAJ Inject into the skin. 10/09/21  Yes [provider]  ondansetron  (ZOFRAN -ODT) 8 MG disintegrating tablet Take 1 tablet (8 mg total) by mouth every 8 (eight) hours as needed for nausea or vomiting. 08/08/23  Yes Alphonsa, Glendia LABOR, MD  potassium chloride  SA (KLOR-CON  M) 20 MEQ tablet TAKE ONE (1) TABLET BY MOUTH EVERY DAY 01/15/24  Yes Luking, Scott A, MD  rosuvastatin (CRESTOR) 5 MG tablet Take 5 mg by mouth daily. 03/25/24  Yes [provider]  sertraline  (ZOLOFT ) 50 MG tablet TAKE ONE-HALF TABLET BY MOUTH TWICE A DAY 02/14/24  Yes Luking, Scott A, MD  tiZANidine (ZANAFLEX) 4 MG tablet Take 4 mg by mouth at bedtime. 02/11/24  Yes [provider]  torsemide  (DEMADEX ) 20 MG tablet TAKE  ONE-HALF TO ONE TABLET BY MOUTH EVERY MORNING AS NEEDED FOR PEDAL EDEMA 04/12/24  Yes Luking, Glendia LABOR, MD  zinc  sulfate 220 (50 Zn) MG capsule Take 1 capsule (220 mg total) by mouth daily. 02/15/20  Yes Johnson, Clanford L, MD  Naldemedine Tosylate  (SYMPROIC ) 0.2 MG TABS TAKE ONE TABLET (0.2MG ) BY MOUTH DAILY Patient not taking: Reported on 04/14/2024 09/25/22   Kennedy Charmaine CROME, NP    Allergies as of 04/14/2024 - Review Complete 04/14/2024  Allergen Reaction Noted   Ambien [zolpidem tartrate]  02/08/2014   Tape Itching and Rash 03/26/2011    Family History  Problem Relation Age of Onset   Healthy Mother    Heart disease Father    Hyperlipidemia Father    Congestive Heart Failure Father    Hypertension Sister    Colon polyps Sister    Colon polyps Sister    Colon polyps Sister    Colon polyps Sister    Cirrhosis Brother  etoh   Healthy Daughter    Healthy Daughter    Colon cancer Paternal Uncle     Social History   Socioeconomic History   Marital status: Married    Spouse name: [name]   Number of children: 2   Years of education: some college   Highest education level: Associate degree: occupational, scientist, product/process development, or vocational program  Occupational History   Occupation: disabled  Tobacco Use   Smoking status: Former    Current packs/day: 0.00    Types: Cigarettes    Quit date: 1990    Years since quitting: 35.8   Smokeless tobacco: Never   Tobacco comments:    4 a day, quit in 1990  Vaping Use   Vaping status: Never Used  Substance and Sexual Activity   Alcohol  use: No   Drug use: No   Sexual activity: Not Currently    Birth control/protection: Surgical  Other Topics Concern   Not on file  Social History Narrative   Lives at home with husband and 2 daughters. 3 grand daughters.    Right handed   Takes 1/2 caffeine pill per day   Social Drivers of Health   Financial Resource Strain: Low Risk  (08/23/2023)   Overall Financial Resource Strain (CARDIA)     Difficulty of Paying Living Expenses: Not hard at all  Food Insecurity: No Food Insecurity (08/23/2023)   Hunger Vital Sign    Worried About Running Out of Food in the Last Year: Never true    Ran Out of Food in the Last Year: Never true  Transportation Needs: No Transportation Needs (08/23/2023)   PRAPARE - Administrator, Civil Service (Medical): No    Lack of Transportation (Non-Medical): No  Physical Activity: Patient Declined (08/23/2023)   Exercise Vital Sign    Days of Exercise per Week: Patient declined    Minutes of Exercise per Session: Patient declined  Stress: No Stress Concern Present (08/23/2023)   [employer] of Occupational Health - Occupational Stress Questionnaire    Feeling of Stress : Not at all  Social Connections: Moderately Integrated (08/23/2023)   Social Connection and Isolation Panel    Frequency of Communication with Friends and Family: More than three times a week    Frequency of Social Gatherings with Friends and Family: Once a week    Attends Religious Services: More than 4 times per year    Active Member of [employer] or Organizations: No    Attends Banker Meetings: Never    Marital Status: Married  Catering Manager Violence: Not At Risk (08/23/2023)   Humiliation, Afraid, Rape, and Kick questionnaire    Fear of Current or Ex-Partner: No    Emotionally Abused: No    Physically Abused: No    Sexually Abused: No    Review of Systems   See HPI, otherwise negative ROS  Physical Exam: BP 128/77 (BP Location: Right Arm, Patient Position: Sitting, Cuff Size: Large)   Pulse (!) 101   Temp 97.9 F (36.6 C) (Oral)   Ht 5' 9 (1.753 m)   Wt 266 lb 12.8 oz (121 kg)   SpO2 94%   BMI 39.40 kg/m  General:   Alert,  Well-developed, well-nourished, pleasant and cooperative in NAD Neck:  Supple; no masses or thyromegaly. No significant cervical adenopathy. Lungs:  Clear throughout to auscultation.   No wheezes, crackles, or rhonchi. No  acute distress. Heart:  Regular rate and rhythm; no murmurs, clicks, rubs,  or gallops.  Abdomen: Non-distended, normal bowel sounds.  Soft and nontender without appreciable mass or hepatosplenomegaly.   Impression/Plan:  Very pleasant 53 year old lady with multiple sclerosis on chronic opioid therapy historically had this had significant opioid induced constipation on Symproic .  Over the past 6 months she has not had to take Symproic  in fact takes some Imodium on a regular basis but does not see this is an issue.  There is been no change in her medication and she continues to take opioids.  I take note that she has been on multiple antibiotic earlier in the year for UTI.  Have to consider the possibility of C. difficile infection in this setting although patient says she feels well.  GERD well-controlled on twice daily lansoprazole  30 mg.  No alarm symptoms.    Recommendations:  stool sample for C. difficile testing.  May continue using Imodium as needed  Continue lansoprazole  20 mg twice daily.  Further recommendations to follow once stool results are available.  Office visit here in 6 months   colorectal cancer screening via Cologuard testing 2027    Notice: This dictation was prepared with Dragon dictation along with smaller phrase technology. Any transcriptional errors that result from this process are unintentional and may not be corrected upon review.

## 2024-05-06 ENCOUNTER — Other Ambulatory Visit: Payer: Self-pay | Admitting: Chiropractic Medicine

## 2024-05-06 DIAGNOSIS — M5441 Lumbago with sciatica, right side: Secondary | ICD-10-CM

## 2024-05-15 ENCOUNTER — Other Ambulatory Visit: Payer: Self-pay | Admitting: Internal Medicine

## 2024-05-15 DIAGNOSIS — R110 Nausea: Secondary | ICD-10-CM

## 2024-05-25 ENCOUNTER — Inpatient Hospital Stay
Admission: RE | Admit: 2024-05-25 | Discharge: 2024-05-25 | Attending: Chiropractic Medicine | Admitting: Chiropractic Medicine

## 2024-05-25 DIAGNOSIS — M5441 Lumbago with sciatica, right side: Secondary | ICD-10-CM

## 2024-06-15 ENCOUNTER — Other Ambulatory Visit: Payer: Self-pay | Admitting: Family Medicine

## 2024-06-15 DIAGNOSIS — G35D Multiple sclerosis, unspecified: Secondary | ICD-10-CM

## 2024-07-22 ENCOUNTER — Ambulatory Visit: Admitting: Family Medicine

## 2024-08-28 ENCOUNTER — Ambulatory Visit
